# Patient Record
Sex: Male | Born: 1975
Health system: Southern US, Community
[De-identification: ages and names within clinical notes are randomized; demographics above are authoritative.]

## PROBLEM LIST (undated history)

## (undated) DIAGNOSIS — I639 Cerebral infarction, unspecified: Secondary | ICD-10-CM

## (undated) DIAGNOSIS — I10 Essential (primary) hypertension: Secondary | ICD-10-CM

## (undated) DIAGNOSIS — E119 Type 2 diabetes mellitus without complications: Secondary | ICD-10-CM

## (undated) HISTORY — DX: Cerebral infarction, unspecified: I63.9

## (undated) HISTORY — PX: OTHER SURGICAL HISTORY: SHX169

---

## 2016-12-19 DIAGNOSIS — I420 Dilated cardiomyopathy: Secondary | ICD-10-CM

## 2016-12-19 HISTORY — DX: Dilated cardiomyopathy: I42.0

## 2017-01-02 ENCOUNTER — Other Ambulatory Visit: Payer: Self-pay

## 2017-01-02 ENCOUNTER — Inpatient Hospital Stay (HOSPITAL_COMMUNITY)
Admission: EM | Admit: 2017-01-02 | Discharge: 2017-01-06 | DRG: 287 | Disposition: A | Discharge status: Home / Self Care | Payer: Self-pay | Attending: Internal Medicine | Admitting: Internal Medicine

## 2017-01-02 ENCOUNTER — Encounter (HOSPITAL_COMMUNITY): Payer: Self-pay | Admitting: Emergency Medicine

## 2017-01-02 ENCOUNTER — Emergency Department (HOSPITAL_COMMUNITY): Payer: Self-pay

## 2017-01-02 DIAGNOSIS — I509 Heart failure, unspecified: Secondary | ICD-10-CM

## 2017-01-02 DIAGNOSIS — I43 Cardiomyopathy in diseases classified elsewhere: Secondary | ICD-10-CM | POA: Diagnosis present

## 2017-01-02 DIAGNOSIS — I5041 Acute combined systolic (congestive) and diastolic (congestive) heart failure: Secondary | ICD-10-CM

## 2017-01-02 DIAGNOSIS — R778 Other specified abnormalities of plasma proteins: Secondary | ICD-10-CM

## 2017-01-02 DIAGNOSIS — Z8249 Family history of ischemic heart disease and other diseases of the circulatory system: Secondary | ICD-10-CM

## 2017-01-02 DIAGNOSIS — I161 Hypertensive emergency: Secondary | ICD-10-CM | POA: Diagnosis present

## 2017-01-02 DIAGNOSIS — R062 Wheezing: Secondary | ICD-10-CM | POA: Diagnosis present

## 2017-01-02 DIAGNOSIS — E138 Other specified diabetes mellitus with unspecified complications: Secondary | ICD-10-CM

## 2017-01-02 DIAGNOSIS — R51 Headache: Secondary | ICD-10-CM | POA: Diagnosis not present

## 2017-01-02 DIAGNOSIS — I110 Hypertensive heart disease with heart failure: Principal | ICD-10-CM | POA: Diagnosis present

## 2017-01-02 DIAGNOSIS — R7989 Other specified abnormal findings of blood chemistry: Secondary | ICD-10-CM

## 2017-01-02 DIAGNOSIS — Z6841 Body Mass Index (BMI) 40.0 and over, adult: Secondary | ICD-10-CM

## 2017-01-02 DIAGNOSIS — I248 Other forms of acute ischemic heart disease: Secondary | ICD-10-CM | POA: Diagnosis present

## 2017-01-02 DIAGNOSIS — E119 Type 2 diabetes mellitus without complications: Secondary | ICD-10-CM

## 2017-01-02 DIAGNOSIS — I160 Hypertensive urgency: Secondary | ICD-10-CM

## 2017-01-02 HISTORY — DX: Essential (primary) hypertension: I10

## 2017-01-02 HISTORY — DX: Type 2 diabetes mellitus without complications: E11.9

## 2017-01-02 LAB — HEPATIC FUNCTION PANEL
ALBUMIN: 4.1 g/dL (ref 3.5–5.0)
ALT: 33 U/L (ref 17–63)
AST: 26 U/L (ref 15–41)
Alkaline Phosphatase: 49 U/L (ref 38–126)
Bilirubin, Direct: 0.1 mg/dL (ref 0.1–0.5)
Indirect Bilirubin: 0.6 mg/dL (ref 0.3–0.9)
TOTAL PROTEIN: 7.3 g/dL (ref 6.5–8.1)
Total Bilirubin: 0.7 mg/dL (ref 0.3–1.2)

## 2017-01-02 LAB — TROPONIN I
TROPONIN I: 0.13 ng/mL — AB (ref <0.03)
TROPONIN I: 0.14 ng/mL — AB (ref <0.03)

## 2017-01-02 LAB — BASIC METABOLIC PANEL
Anion gap: 10 (ref 5–15)
BUN: 13 mg/dL (ref 6–20)
CHLORIDE: 105 mmol/L (ref 101–111)
CO2: 26 mmol/L (ref 22–32)
CREATININE: 1.25 mg/dL — AB (ref 0.61–1.24)
Calcium: 9 mg/dL (ref 8.9–10.3)
GFR calc Af Amer: >60 mL/min (ref >60)
Glucose, Bld: 129 mg/dL — ABNORMAL HIGH (ref 65–99)
Potassium: 4.2 mmol/L (ref 3.5–5.1)
SODIUM: 141 mmol/L (ref 135–145)

## 2017-01-02 LAB — GLUCOSE, CAPILLARY
GLUCOSE-CAPILLARY: 108 mg/dL — AB (ref 65–99)
Glucose-Capillary: 147 mg/dL — ABNORMAL HIGH (ref 65–99)

## 2017-01-02 LAB — CBC
HCT: 43.5 % (ref 39.0–52.0)
HEMATOCRIT: 40.8 % (ref 39.0–52.0)
HEMOGLOBIN: 13.1 g/dL (ref 13.0–17.0)
Hemoglobin: 14 g/dL (ref 13.0–17.0)
MCH: 26 pg (ref 26.0–34.0)
MCH: 26 pg (ref 26.0–34.0)
MCHC: 32.2 g/dL (ref 30.0–36.0)
MCHC: 32.1 g/dL (ref 30.0–36.0)
MCV: 80.7 fL (ref 78.0–100.0)
MCV: 81.1 fL (ref 78.0–100.0)
PLATELETS: 278 10*3/uL (ref 150–400)
Platelets: 254 10*3/uL (ref 150–400)
RBC: 5.39 MIL/uL (ref 4.22–5.81)
RBC: 5.03 MIL/uL (ref 4.22–5.81)
RDW: 14.5 % (ref 11.5–15.5)
RDW: 14.4 % (ref 11.5–15.5)
WBC: 7.9 10*3/uL (ref 4.0–10.5)
WBC: 8.4 10*3/uL (ref 4.0–10.5)

## 2017-01-02 LAB — TSH: TSH: 0.553 u[IU]/mL (ref 0.350–4.500)

## 2017-01-02 LAB — POCT I-STAT TROPONIN I
Troponin i, poc: 0.29 ng/mL (ref 0.00–0.08)
Troponin i, poc: 0.2 ng/mL (ref 0.00–0.08)

## 2017-01-02 LAB — MRSA PCR SCREENING: MRSA by PCR: NEGATIVE

## 2017-01-02 LAB — CREATININE, SERUM: Creatinine, Ser: 1.26 mg/dL — ABNORMAL HIGH (ref 0.61–1.24)

## 2017-01-02 LAB — BRAIN NATRIURETIC PEPTIDE: B Natriuretic Peptide: 493.4 pg/mL — ABNORMAL HIGH (ref 0.0–100.0)

## 2017-01-02 MED ORDER — ASPIRIN 81 MG PO CHEW
324.0000 mg | CHEWABLE_TABLET | Freq: Once | ORAL | Status: AC
  Administered 2017-01-02: 324 mg via ORAL
  Filled 2017-01-02: qty 4

## 2017-01-02 MED ORDER — SODIUM CHLORIDE 0.9 % IV SOLN: 250.0000 mL | INTRAVENOUS | Status: DC | PRN

## 2017-01-02 MED ORDER — NITROGLYCERIN IN D5W 200-5 MCG/ML-% IV SOLN
0.0000 ug/min | Freq: Once | INTRAVENOUS | Status: AC
  Administered 2017-01-02: 5 ug/min via INTRAVENOUS
  Filled 2017-01-02: qty 250

## 2017-01-02 MED ORDER — ENOXAPARIN SODIUM 40 MG/0.4ML ~~LOC~~ SOLN
40.0000 mg | SUBCUTANEOUS | Status: DC
  Administered 2017-01-02 – 2017-01-05 (×4): 40 mg via SUBCUTANEOUS
  Filled 2017-01-02 (×4): qty 0.4

## 2017-01-02 MED ORDER — INSULIN ASPART 100 UNIT/ML ~~LOC~~ SOLN
0.0000 [IU] | Freq: Three times a day (TID) | SUBCUTANEOUS | Status: DC
  Administered 2017-01-03: 2 [IU] via SUBCUTANEOUS
  Administered 2017-01-03 – 2017-01-06 (×6): 3 [IU] via SUBCUTANEOUS

## 2017-01-02 MED ORDER — HYDRALAZINE HCL 20 MG/ML IJ SOLN
10.0000 mg | Freq: Once | INTRAMUSCULAR | Status: AC
  Administered 2017-01-02: 10 mg via INTRAVENOUS
  Filled 2017-01-02: qty 0.5

## 2017-01-02 MED ORDER — ALBUTEROL SULFATE (2.5 MG/3ML) 0.083% IN NEBU
2.5000 mg | INHALATION_SOLUTION | Freq: Once | RESPIRATORY_TRACT | Status: AC
  Administered 2017-01-02: 2.5 mg via RESPIRATORY_TRACT
  Filled 2017-01-02: qty 3

## 2017-01-02 MED ORDER — SODIUM CHLORIDE 0.9% FLUSH
3.0000 mL | Freq: Two times a day (BID) | INTRAVENOUS | Status: DC
  Administered 2017-01-02 – 2017-01-06 (×8): 3 mL via INTRAVENOUS

## 2017-01-02 MED ORDER — INSULIN ASPART 100 UNIT/ML ~~LOC~~ SOLN
0.0000 [IU] | Freq: Every day | SUBCUTANEOUS | Status: DC
  Administered 2017-01-05: 2 [IU] via SUBCUTANEOUS

## 2017-01-02 MED ORDER — HYDROCHLOROTHIAZIDE 25 MG PO TABS
25.0000 mg | ORAL_TABLET | Freq: Every day | ORAL | Status: DC
  Administered 2017-01-02 – 2017-01-03 (×2): 25 mg via ORAL
  Filled 2017-01-02 (×2): qty 1

## 2017-01-02 MED ORDER — ASPIRIN EC 81 MG PO TBEC
81.0000 mg | DELAYED_RELEASE_TABLET | Freq: Every day | ORAL | Status: DC
  Administered 2017-01-02 – 2017-01-06 (×4): 81 mg via ORAL
  Filled 2017-01-02 (×4): qty 1

## 2017-01-02 MED ORDER — ACETAMINOPHEN 325 MG PO TABS
650.0000 mg | ORAL_TABLET | ORAL | Status: DC | PRN
  Administered 2017-01-02 – 2017-01-06 (×6): 650 mg via ORAL
  Filled 2017-01-02 (×6): qty 2

## 2017-01-02 MED ORDER — ONDANSETRON HCL 4 MG/2ML IJ SOLN
4.0000 mg | Freq: Four times a day (QID) | INTRAMUSCULAR | Status: DC | PRN
  Administered 2017-01-05: 4 mg via INTRAVENOUS
  Filled 2017-01-02: qty 2

## 2017-01-02 MED ORDER — NITROGLYCERIN IN D5W 200-5 MCG/ML-% IV SOLN
0.0000 ug/min | INTRAVENOUS | Status: DC
  Administered 2017-01-02: 75 ug/min via INTRAVENOUS
  Administered 2017-01-03: 30 ug/min via INTRAVENOUS
  Administered 2017-01-03: 0 ug/min via INTRAVENOUS
  Administered 2017-01-03: 20 ug/min via INTRAVENOUS
  Administered 2017-01-03: 45 ug/min via INTRAVENOUS
  Filled 2017-01-02 (×2): qty 250

## 2017-01-02 MED ORDER — FUROSEMIDE 10 MG/ML IJ SOLN
40.0000 mg | Freq: Once | INTRAMUSCULAR | Status: AC
  Administered 2017-01-02: 40 mg via INTRAVENOUS
  Filled 2017-01-02: qty 4

## 2017-01-02 MED ORDER — SODIUM CHLORIDE 0.9% FLUSH: 3.0000 mL | INTRAVENOUS | Status: DC | PRN

## 2017-01-02 NOTE — ED Notes (Signed)
Pt left with [facility] 

## 2017-01-02 NOTE — ED Notes (Addendum)
[facility] called to transfer pt to 4N at [facility]

## 2017-01-02 NOTE — Care Management Note (Addendum)
Case Management Note  Patient Details  Name: Rodney Kane MRN: [id number] Date of Birth: 1976/06/09  Subjective/Objective:          Patient presented to [facility] ED with c/o CP and SOB. Past Medical Hx of HTN and DM patient reports being off his meds for sometime. ED work up revealed  Rule out pneumonia. Rule out CHF  Patient recently relocated to the area from [place]. Patient currently is uninsured without any follow up. CM discussed possible care transition plan for follow at the [facility] patient is agreeable with plan to establish care at the clinic. CM scheduled appointment next Wednesday 6/28 at 9:45am Also discussed the [facility] program and its guidelines including the $3 co-pay per prescription, patient verbalized understanding and teach back done he is agreeable. Letter will be printed and placed in patient's shadow chart. Patient is being transferred to [facility] for admission. CM explained that the Unit CM will continue to follow for any additional CM needs.    Action/Plan:   Expected Discharge Date:   (unknown)               Expected Discharge Plan:  Home/Self Care  In-House Referral:   Contacted Daphne RN navigator concerning possible follow up at the [facility]   Discharge planning Services  CM Consult, [facility]  Post Acute Care Choice:    Choice offered to:  Patient  DME Arranged:  N/A DME Agency:  NA  HH Arranged:  NA HH Agency:  NA  Status of Service:  In process, will continue to follow  If discussed at Long Length of Stay Meetings, dates discussed:    Additional CommentsMichel Bickers, RN 01/02/2017, 12:34 PM

## 2017-01-02 NOTE — ED Triage Notes (Addendum)
Pt c/o SOB onset 2 weeks ago after mild illness with cough. Chest pressure x 1 week. SOB and chest pressure worse with activity and laying supine, relieved with rest and sitting upright.

## 2017-01-02 NOTE — Progress Notes (Signed)
Pt arrived to floor, text page to cards per order to let them know of pts arrival.

## 2017-01-02 NOTE — ED Notes (Signed)
Patient transported to X-ray 

## 2017-01-02 NOTE — ED Notes (Signed)
Pt updated on plan of care and notified he will soon be transferred to [facility]

## 2017-01-02 NOTE — ED Notes (Signed)
Pt is currently at 29mcg/min of nitroglycerin. Pt's BP is 165/100

## 2017-01-02 NOTE — Progress Notes (Signed)
Pt. admitted to 4NP01 from [facility] ED via [facility]. Oriented to room, call bell, Ascom phones and staff. Bed in low position, fall safety plan reviewed.  Full assessment to Epic; skin assessed with Alinda Money, RN.   Nitro gtt infusing w/o difficulty, pt denies any chest pain/pressure/discomfort at this time. BP remains elevated, 160s/100s, nitro increased to per order. Pt does endorse a headache. Cardiology aware of pt arrival to floor and need for orders. Will continue to monitor.

## 2017-01-02 NOTE — ED Notes (Signed)
Upon assessment of pt BP, Nitro drip is being titrated.

## 2017-01-02 NOTE — ED Notes (Signed)
Pt requesting food and po fluid. MD made aware. MD informed nurse that pt may eat. Pt given food and PO fluid

## 2017-01-02 NOTE — H&P (Addendum)
Patient ID: Rodney Kane MRN: [id number], DOB/AGE: 26-Aug-1975  Admit date: 01/02/2017 Primary Physician: Patient, No Pcp Per Primary Cardiologist: None  CARDIOLOGY ADMISSION History & Physical   HISTORY OF PRESENT ILLNESS   [name] is a 41 year old male with history of diet controlled diabetes, hypertension (was taking HCTZ, stopped 1 year ago) who is admitted with elevated troponin.  The patient tells me that he was in his normal state of health until two weeks ago. At baseline, he is active. 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 He developed cold-like symptoms that were accompanied by shortness of breath, productive cough (mucus), central chest pressure, and left sided pleuritic chest pain. After a few days the cold-like sympoms improved (sore throat, fatigue) but the other symptoms have progressed over about two weeks (they improved mid-week only to worsen the past couple days). He tells me that he has had no significant edema, but has noticed ~20 lb weight increase over about one week. No orthopnea or PND. He had one episode of presyncope about one month ago, but cannot provide any details. He denies fevers, chills, travel. One episode of diarrhea, otherwise no n/v/d. No abd pain. No rashes or swollen joints.  At work today he was struggling to breath due to wheezing and was told to visit the ED at [facility]. In the ED his BP was elevated (SBP > 200 mmHg) and he was tachycardic (110-120s). He was not hypoxic. He received quite a bit of relief with nebulizers, and was placed on nitro drip. Dr. Debara Pickett was contacted for transfer. Here his BP is better controlled and he is asymptomatic.  Review of Systems General:  No chills, fever, night sweats or weight changes.  Cardiovascular:  See HPI Dermatological: No rash, lesions/masses Respiratory: See HPI Urologic: No hematuria, dysuria Abdominal:   No nausea, vomiting, diarrhea, bright red blood per rectum,  melena, or hematemesis Neurologic:  No visual changes, wkns, changes in mental status. All other systems reviewed and are otherwise negative except as noted above.   MEDICAL, FAMILY, AND SOCIAL HISTORY   Past Medical History:  Diagnosis Date  . Diabetes mellitus without complication ([place])   . Hypertension     Past Surgical History:  Procedure Laterality Date  . left knee surgery      No Known Allergies Prior to Admission medications   Not on File   Family History  Problem Relation Age of Onset  . Multiple sclerosis Mother   . Diabetic kidney disease Mother   . Hypertension Mother    Social History   Social History  . Marital status: Single    Spouse name: N/A  . Number of children: N/A  . Years of education: N/A   Occupational History  . Not on file.   Social History Main Topics  . Smoking status: Never Smoker  . Smokeless tobacco: Never Used  . Alcohol use No  . Drug use: No  . Sexual activity: Not on file   Other Topics Concern  . Not on file   Social History Narrative  . No narrative on file      PHYSICAL EXAM  Blood pressure (!) 163/106, pulse 98, temperature 98.1 F (36.7 C), resp. rate 19, height 6' 1" (1.854 m), weight (!) 162 kg (357 lb 1.6 oz), SpO2 97 %.  General: Pleasant, NAD Psych: Normal affect. Neuro: Alert and oriented X 3. Moves all extremities spontaneously. HEENT: Sclera anicteric, noninjected. MMM.  Neck: No bruits. JVP is ~15 cm. Lungs:  Clear with no wheezes or crackles Heart: regular rate and rhythm, no m/r/g Abdomen: Soft, non-tender, non-distended, BS + x 4.  Extremities: No clubbing or cyanosis. Edema: 1+ BL LE edema. DP/PT/Radials 2+ and equal bilaterally.   LABS and STUDIES  Troponin (Point of Care Test)  Recent Labs  01/02/17 1256  TROPIPOC 0.20*    Recent Labs  01/02/17 1651  TROPONINI 0.13*   Lab Results  Component Value Date   WBC 7.9 01/02/2017   HGB 14.0 01/02/2017   HCT 43.5 01/02/2017   MCV 80.7  01/02/2017   PLT 278 01/02/2017    Recent Labs Lab 01/02/17 0800 01/02/17 1651  NA 141  --   K 4.2  --   CL 105  --   CO2 26  --   BUN 13  --   CREATININE 1.25*  --   CALCIUM 9.0  --   PROT  --  7.3  BILITOT  --  0.7  ALKPHOS  --  49  ALT  --  33  AST  --  26  GLUCOSE 129*  --    No results found for: CHOL, HDL, LDLCALC, TRIG No results found for: DDIMER   Radiology/Studies. Independently Reviewed CXR showed pulmonary parenchymal appearance suggestive of interstitial edema.   ECG was independently reviewed. Sinus tach, HR 113 bpm, inverted T-waves laterally   ASSESSMENT and PLAN   41 year old man with cold-like symptoms for two weeks accompanied by chest pressure, weight gain, mild LE edema. Given CXR findings, there is concern for new diagnosis CHF. Pro-BNP is mildly elevated. He already received furosemide, but he only appears mildly fluid overloaded.  Hypertensive Urgency - Nitroglycerine has controlled his blood pressure. Will titrate down with goal SBP ~150 mmHg - Starting HCTZ 25 mg daily  Concern for new diagnosis CHF. Etiology unclear - ddx includes viral myocarditis (and other forms of NICM) and subacute presentation of ICM. - The patient is mildly volume overloaded, but he is not hypoxic. He is warm and well perfused. - Echocardiogram in the morning - S/P Furosemide 40 mg IV - Watch I/Os, daily weights - Holding on BB (volume overloaded) and ACE-I (mildly elevated Cr, unclear of baseline) - ESR, CRP, HIV pending  Elevated troponin. Chest discomfort has been present for two weeks and therefore is unlikely ACS. Likely due to demand ischemia. - No anticoagulation at this time - Continue to trend troponin - Aspirin 81 mg daily - Lipids pending  Recent Viral URI. Initial presentation the patient had wheezes on exam, although none once he reached [facility].  - Continue Albuterol prn for wheezing  History of Diabetes. Diet-controlled. - Hgb A1C Pending - Sliding  Scale Insulin  IVF: None Diet: Heart Healthy, NPO at midnight DVT PPX: Lovenox Dispo: Admit to [facility], Cardiology service Code Status: Full Code  PAULEY, ERIC D, MD 01/02/2017, 9:45 PM

## 2017-01-02 NOTE — ED Provider Notes (Addendum)
[facility]-EMERGENCY DEPT Provider Note   CSN: [id number] Arrival date & time: 01/02/17  5974     History   Chief Complaint Chief Complaint  Patient presents with  . Shortness of Breath  . Chest Pain    HPI Daemeon Bridwell is a 41 y.o. male. CC: SOB  HPI:  This is a 41 year old male with a past medical history of hypertension. He has been off his medications for several months. Used to take hydrochlorothiazide and something else". States he had a "cold" with runny nose and cough and no fever that 2 weeks ago. Since that time he's had shortness of breath. At times feels a weight across his chest like "I can't get my lungs full". This feeling his right greater than left. No hemoptysis. Frequent cough and wheezing. No additional fever. No leg swelling.  Past Medical History:  Diagnosis Date  . Diabetes mellitus without complication (HCC)   . Hypertension     There are no active problems to display for this patient.   No past surgical history on file.     Home Medications    Prior to Admission medications   Not on File    Family History No family history on file.  Social History Social History  Substance Use Topics  . Smoking status: Not on file  . Smokeless tobacco: Not on file  . Alcohol use Not on file     Allergies   Patient has no known allergies.   Review of Systems Review of Systems  Constitutional: Negative for appetite change, chills, diaphoresis, fatigue and fever.  HENT: Positive for congestion and rhinorrhea. Negative for mouth sores, sore throat and trouble swallowing.   Eyes: Negative for visual disturbance.  Respiratory: Positive for cough and shortness of breath. Negative for chest tightness and wheezing.   Cardiovascular: Negative for chest pain.  Gastrointestinal: Negative for abdominal distention, abdominal pain, diarrhea, nausea and vomiting.  Endocrine: Negative for polydipsia, polyphagia and polyuria.  Genitourinary: Negative for dysuria,  frequency and hematuria.  Musculoskeletal: Negative for gait problem.  Skin: Negative for color change, pallor and rash.  Neurological: Negative for dizziness, syncope, light-headedness and headaches.  Hematological: Does not bruise/bleed easily.  Psychiatric/Behavioral: Negative for behavioral problems and confusion.     Physical Exam Updated Vital Signs BP (!) 194/115   Pulse (!) 114   Temp 97.7 F (36.5 C) (Oral)   Resp 20   SpO2 97%   Physical Exam  Constitutional: He is oriented to person, place, and time. He appears well-developed and well-nourished. No distress.  HENT:  Head: Normocephalic.  Eyes: Conjunctivae are normal. Pupils are equal, round, and reactive to light. No scleral icterus.  Neck: Normal range of motion. Neck supple. No thyromegaly present.  Cardiovascular: Regular rhythm.  Exam reveals no gallop and no friction rub.   No murmur heard. Sinus tachycardia 109. No gallop. No JVD. No dependent edema.  Pulmonary/Chest: Effort normal and breath sounds normal. No respiratory distress. He has no wheezes. He has no rales.  Wheezing with cough. No prolongation at rest.  Abdominal: Soft. Bowel sounds are normal. He exhibits no distension. There is no tenderness. There is no rebound.  Musculoskeletal: Normal range of motion.  Neurological: He is alert and oriented to person, place, and time.  Skin: Skin is warm and dry. No rash noted.  Psychiatric: He has a normal mood and affect. His behavior is normal.     ED Treatments / Results  Labs (all labs ordered are listed, but  only abnormal results are displayed) Labs Reviewed  BASIC METABOLIC PANEL - Abnormal; Notable for the following:       Result Value   Glucose, Bld 129 (*)    Creatinine, Ser 1.25 (*)    All other components within normal limits  POCT I-STAT TROPONIN I - Abnormal; Notable for the following:    Troponin i, poc 0.29 (*)    All other components within normal limits  CBC  BRAIN NATRIURETIC  PEPTIDE  I-STAT TROPOININ, ED    EKG  EKG Interpretation None       Radiology Dg Chest 2 View  Result Date: 01/02/2017 CLINICAL DATA:  41 year old male with shortness of breath for 2 weeks. Illness with cough. Chest pressure, pain for 1 week. EXAM: CHEST  2 VIEW COMPARISON:  None. FINDINGS: Cardiomegaly. Other mediastinal contours are within normal limits. Visualized tracheal air column is within normal limits. Increased pulmonary interstitial opacity. Possible trace pleural fluid in the fissures. No pneumothorax or layering effusion. No confluent pulmonary opacity. No acute osseous abnormality identified. Negative visible bowel gas pattern. IMPRESSION: Cardiomegaly, with pulmonary parenchymal appearance suggestive of interstitial edema. Consider congestive heart failure. Electronically Signed   By: Odessa Fleming M.D.   On: 01/02/2017 08:22    Procedures Procedures (including critical care time)  Medications Ordered in ED Medications  hydrALAZINE (APRESOLINE) injection 10 mg (not administered)  furosemide (LASIX) injection 40 mg (not administered)  nitroGLYCERIN 50 mg in dextrose 5 % 250 mL (0.2 mg/mL) infusion (not administered)  aspirin chewable tablet 324 mg (not administered)  albuterol (PROVENTIL) (2.5 MG/3ML) 0.083% nebulizer solution 2.5 mg (2.5 mg Nebulization Given 01/02/17 0817)     Initial Impression / Assessment and Plan / ED Course  I have reviewed the triage vital signs and the nursing notes.  Pertinent labs & imaging results that were available during my care of the patient were reviewed by me and considered in my medical decision making (see chart for details).   Pt with recent URI with bronchospasm. Rule out pneumonia. Rule out CHF. Patient had anterior chest pressure although is pain free now. Consider myocarditis, versus subacute MI. He is hypertensive. We'll trend his blood pressures here. Treat as needed based on additional studies. Does have resting tachycardia.  Would have to consider pulmonary embolus is no other etiology found.  08:39:  Chest x-ray shows scarring megaly and CHF with interstitial edema. Troponin elevated at 0.29. Creatinine 1.25. Differential diagnosis includes and stenting, follow myocarditis. Given hydralazine 10 mg IV. Given Lasix 40 mg IV. We'll start nitroglycerin drip for blood pressure. Given aspirin. Remains pain-free. Still dyspneic, but not hypoxemic. Will discuss with cardiology for admission plans  Final Clinical Impressions(s) / ED Diagnoses   Final diagnoses:  Acute congestive heart failure, unspecified heart failure type (HCC)  Malignant hypertensive urgency    CRITICAL CARE Performed by: Rolland Porter JOSEPH   Total critical care time: 30 minutes  Critical care time was exclusive of separately billable procedures and treating other patients.  Critical care was necessary to treat or prevent imminent or life-threatening deterioration.  Critical care was time spent personally by me on the following activities: development of treatment plan with patient and/or surrogate as well as nursing, discussions with consultants, evaluation of patient's response to treatment, examination of patient, obtaining history from patient or surrogate, ordering and performing treatments and interventions, ordering and review of laboratory studies, ordering and review of radiographic studies, pulse oximetry and re-evaluation of patient's condition.   New Prescriptions  New Prescriptions   No medications on file     08:59: Discussed with Dr. Rennis Golden of CHF cardiology cardiology. He would prefer patient be transferred to [facility] for further evaluation and treatment.   Rolland Porter, MD 01/02/17 1610    Rolland Porter, MD 01/02/17 [phone]

## 2017-01-02 NOTE — ED Notes (Signed)
Report given to Emer, Charity fundraiser at [facility]

## 2017-01-02 NOTE — ED Notes (Signed)
Pt is at 37mcg/min of nitroglycerin. Pt is holding steady between 120-135 systolic. Pt is Alert and oriented x4 and states he is not having any pain.

## 2017-01-03 ENCOUNTER — Other Ambulatory Visit (HOSPITAL_COMMUNITY): Payer: Self-pay

## 2017-01-03 LAB — URINALYSIS, ROUTINE W REFLEX MICROSCOPIC
BACTERIA UA: NONE SEEN
Bilirubin Urine: NEGATIVE
Glucose, UA: NEGATIVE mg/dL
KETONES UR: NEGATIVE mg/dL
LEUKOCYTES UA: NEGATIVE
Nitrite: NEGATIVE
PROTEIN: NEGATIVE mg/dL
SQUAMOUS EPITHELIAL / LPF: NONE SEEN
Specific Gravity, Urine: 1.013 (ref 1.005–1.030)
pH: 5 (ref 5.0–8.0)

## 2017-01-03 LAB — SEDIMENTATION RATE: SED RATE: 5 mm/h (ref 0–16)

## 2017-01-03 LAB — RAPID URINE DRUG SCREEN, HOSP PERFORMED
Amphetamines: NOT DETECTED
BENZODIAZEPINES: NOT DETECTED
Barbiturates: NOT DETECTED
COCAINE: NOT DETECTED
OPIATES: NOT DETECTED
Tetrahydrocannabinol: NOT DETECTED

## 2017-01-03 LAB — C-REACTIVE PROTEIN: CRP: 1.1 mg/dL — ABNORMAL HIGH (ref <1.0)

## 2017-01-03 LAB — GLUCOSE, CAPILLARY
GLUCOSE-CAPILLARY: 114 mg/dL — AB (ref 65–99)
Glucose-Capillary: 137 mg/dL — ABNORMAL HIGH (ref 65–99)
Glucose-Capillary: 124 mg/dL — ABNORMAL HIGH (ref 65–99)
Glucose-Capillary: 153 mg/dL — ABNORMAL HIGH (ref 65–99)

## 2017-01-03 LAB — HEMOGLOBIN A1C
Hgb A1c MFr Bld: 7 % — ABNORMAL HIGH (ref 4.8–5.6)
Mean Plasma Glucose: 154 mg/dL

## 2017-01-03 LAB — BASIC METABOLIC PANEL
Anion gap: 10 (ref 5–15)
BUN: 12 mg/dL (ref 6–20)
CALCIUM: 9.3 mg/dL (ref 8.9–10.3)
CHLORIDE: 99 mmol/L — AB (ref 101–111)
CO2: 28 mmol/L (ref 22–32)
CREATININE: 1.25 mg/dL — AB (ref 0.61–1.24)
Glucose, Bld: 109 mg/dL — ABNORMAL HIGH (ref 65–99)
Potassium: 4.4 mmol/L (ref 3.5–5.1)
SODIUM: 137 mmol/L (ref 135–145)

## 2017-01-03 LAB — LIPID PANEL
Cholesterol: 171 mg/dL (ref 0–200)
HDL: 42 mg/dL (ref >40)
LDL Cholesterol: 115 mg/dL — ABNORMAL HIGH (ref 0–99)
TRIGLYCERIDES: 70 mg/dL (ref <150)
Total CHOL/HDL Ratio: 4.1 RATIO
VLDL: 14 mg/dL (ref 0–40)

## 2017-01-03 LAB — TROPONIN I
TROPONIN I: 0.18 ng/mL — AB (ref <0.03)
TROPONIN I: 0.1 ng/mL — AB (ref <0.03)

## 2017-01-03 MED ORDER — FUROSEMIDE 10 MG/ML IJ SOLN
40.0000 mg | Freq: Two times a day (BID) | INTRAMUSCULAR | Status: DC
  Administered 2017-01-03 – 2017-01-04 (×3): 40 mg via INTRAVENOUS
  Filled 2017-01-03 (×3): qty 4

## 2017-01-03 MED ORDER — AMLODIPINE BESYLATE 10 MG PO TABS
10.0000 mg | ORAL_TABLET | Freq: Every day | ORAL | Status: DC
  Administered 2017-01-03 – 2017-01-04 (×2): 10 mg via ORAL
  Filled 2017-01-03 (×2): qty 1

## 2017-01-03 MED ORDER — HYDRALAZINE HCL 25 MG PO TABS
37.5000 mg | ORAL_TABLET | Freq: Three times a day (TID) | ORAL | Status: DC
Start: 2017-01-03 — End: 2017-01-06
  Administered 2017-01-03 – 2017-01-06 (×10): 37.5 mg via ORAL
  Filled 2017-01-03: qty 2
  Filled 2017-01-03 (×3): qty 1.5
  Filled 2017-01-03: qty 2
  Filled 2017-01-03 (×2): qty 1.5
  Filled 2017-01-03: qty 2
  Filled 2017-01-03 (×4): qty 1.5
  Filled 2017-01-03: qty 2

## 2017-01-03 MED ORDER — FUROSEMIDE 10 MG/ML IJ SOLN: 40.0000 mg | Freq: Two times a day (BID) | INTRAMUSCULAR | Status: DC

## 2017-01-03 NOTE — Progress Notes (Addendum)
Progress Note  Patient Name: Rodney Kane Date of Encounter: 01/03/2017    Subjective   SOB improved. No chest pain. Headache from NG drip  Inpatient Medications    Scheduled Meds: . aspirin EC  81 mg Oral Daily  . enoxaparin (LOVENOX) injection  40 mg Subcutaneous Q24H  . hydrochlorothiazide  25 mg Oral Daily  . insulin aspart  0-15 Units Subcutaneous TID WC  . insulin aspart  0-5 Units Subcutaneous QHS  . sodium chloride flush  3 mL Intravenous Q12H   Continuous Infusions: . sodium chloride    . nitroGLYCERIN 45 mcg/min (01/03/17 1010)   PRN Meds: sodium chloride, acetaminophen, ondansetron (ZOFRAN) IV, sodium chloride flush   Vital Signs    Vitals:   01/03/17 0430 01/03/17 0500 01/03/17 0635 01/03/17 0730  BP: (!) 138/91 (!) 146/104 (!) 156/109 (!) 148/89  Pulse: (!) 107 (!) 102 (!) 104 (!) 109  Resp: (!) 23 (!) 29 (!) 24 (!) 22  Temp:    99.8 F (37.7 C)  TempSrc:    Oral  SpO2: 92% 96% 94% 98%  Weight:  (!) 357 lb (161.9 kg)    Height:        Intake/Output Summary (Last 24 hours) at 01/03/17 1113 Last data filed at 01/03/17 0800  Gross per 24 hour  Intake           214.38 ml  Output              650 ml  Net          -435.62 ml   Filed Weights   01/02/17 0935 01/02/17 2033 01/03/17 0500  Weight: (!) 360 lb (163.3 kg) (!) 357 lb 1.6 oz (162 kg) (!) 357 lb (161.9 kg)    Telemetry    SR and sinus tach - Personally Reviewed  ECG      Physical Exam   GEN: No acute distress.   Neck: No JVD Cardiac: RRR, no murmurs, rubs, or gallops.  Respiratory: Clear to auscultation bilaterally. GI: Soft, nontender, non-distended  MS: No edema; No deformity. Neuro:  Nonfocal  Psych: Normal affect   Labs    Chemistry Recent Labs Lab 01/02/17 0800 01/02/17 1651 01/02/17 2230 01/03/17 0417  NA 141  --   --  137  K 4.2  --   --  4.4  CL 105  --   --  99*  CO2 26  --   --  28  GLUCOSE 129*  --   --  109*  BUN 13  --   --  12  CREATININE 1.25*  --   1.26* 1.25*  CALCIUM 9.0  --   --  9.3  PROT  --  7.3  --   --   ALBUMIN  --  4.1  --   --   AST  --  26  --   --   ALT  --  33  --   --   ALKPHOS  --  49  --   --   BILITOT  --  0.7  --   --   GFRNONAA >60  --  >60 >60  GFRAA >60  --  >60 >60  ANIONGAP 10  --   --  10     Hematology Recent Labs Lab 01/02/17 0800 01/02/17 2230  WBC 7.9 8.4  RBC 5.39 5.03  HGB 14.0 13.1  HCT 43.5 40.8  MCV 80.7 81.1  MCH 26.0 26.0  MCHC 32.2 32.1  RDW 14.5 14.4  PLT 278 254    Cardiac Enzymes Recent Labs Lab 01/02/17 1651 01/02/17 2230 01/03/17 0417  TROPONINI 0.13* 0.14* 0.18*    Recent Labs Lab 01/02/17 0814 01/02/17 1256  TROPIPOC 0.29* 0.20*     BNP Recent Labs Lab 01/02/17 0800  BNP 493.4*     DDimer No results for input(s): DDIMER in the last 168 hours.   Radiology    Dg Chest 2 View  Result Date: 01/02/2017 CLINICAL DATA:  41 year old male with shortness of breath for 2 weeks. Illness with cough. Chest pressure, pain for 1 week. EXAM: CHEST  2 VIEW COMPARISON:  None. FINDINGS: Cardiomegaly. Other mediastinal contours are within normal limits. Visualized tracheal air column is within normal limits. Increased pulmonary interstitial opacity. Possible trace pleural fluid in the fissures. No pneumothorax or layering effusion. No confluent pulmonary opacity. No acute osseous abnormality identified. Negative visible bowel gas pattern. IMPRESSION: Cardiomegaly, with pulmonary parenchymal appearance suggestive of interstitial edema. Consider congestive heart failure. Electronically Signed   By: Odessa Fleming M.D.   On: 01/02/2017 08:22    Cardiac Studies    Patient Profile      41 year old man with cold-like symptoms for two weeks accompanied by chest pressure, weight gain, mild LE edema. Given CXR findings, there is concern for new diagnosis CHF  Assessment & Plan    1. Acute CHF - mechanism unclear at this time, echo is pending - severe HTN on admission, possible HTN  CM. Also recent viral syndrome. Always consider ICM - he is negative 1.3 liters since admission. REnal function is stable, he received lasix 40mg  IV x 2 yesterday  - continue IV lasix today, 40mg  bid.    2. HTN emergency - initial bp 194/115. He reports he stopped taking his home bp's. - started on NG drip on admission in setting of pulmonary edema. - start norvasc 10mg , hydralazine 37.5mg  tid at this time. If systolic dysfunction will need to change regiment. Once diuresed and stable Cr can consider changing hydral to ACE-I.    3. Elevated troponin - peak 0.29, trending down by istat troponins, lab trops actually 0.14 with mild uptrend to 0.18. Marland Kitchen Occurred in setting of HTN emergency and CHF. EKG diffuse T-wave inversions, no old EKG to compare.  - f/u echo, will need either invasive vs noninvasive ischemic testing pending echo results and clinical course   4. DM2 - Hgb A1c is 7, he has history of diet controlled diabetes       Signed, Dina Rich, MD  01/03/2017, 11:13 AM

## 2017-01-04 ENCOUNTER — Inpatient Hospital Stay (HOSPITAL_COMMUNITY): Payer: Self-pay

## 2017-01-04 DIAGNOSIS — I5030 Unspecified diastolic (congestive) heart failure: Secondary | ICD-10-CM

## 2017-01-04 HISTORY — PX: TRANSTHORACIC ECHOCARDIOGRAM: SHX275

## 2017-01-04 LAB — ECHOCARDIOGRAM COMPLETE
Ao-asc: 29 cm
CHL CUP MV DEC (S): 173
EERAT: 13.09
EWDT: 173 ms
FS: 18 % — AB (ref 28–44)
Height: 73 in
IV/PV OW: 1.22
LA vol A4C: 33.1 ml
LA vol index: 15.1 mL/m2
LA vol: 41.2 mL
LADIAMINDEX: 1.28 cm/m2
LASIZE: 35 mm
LEFT ATRIUM END SYS DIAM: 35 mm
LV E/e' medial: 13.09
LV SIMPSON'S DISK: 38
LV TDI E'LATERAL: 4.47
LV TDI E'MEDIAL: 3.92
LV dias vol index: 50 mL/m2
LV dias vol: 137 mL (ref 62–150)
LV e' LATERAL: 4.47 cm/s
LV sys vol index: 31 mL/m2
LV sys vol: 85 mL — AB (ref 21–61)
LVEEAVG: 13.09
LVOT SV: 38 mL
LVOT VTI: 10.1 cm
LVOT area: 3.8 cm2
LVOT diameter: 22 mm
LVOTPV: 67.8 cm/s
Lateral S' vel: 13.4 cm/s
MVPKAVEL: 61.7 m/s
MVPKEVEL: 58.5 m/s
PW: 12.5 mm — AB (ref 0.6–1.1)
S' Lateral: 5.36 cm/s
Stroke v: 52 ml
TAPSE: 23.9 mm
Weight: 5600 oz

## 2017-01-04 LAB — GLUCOSE, CAPILLARY
GLUCOSE-CAPILLARY: 136 mg/dL — AB (ref 65–99)
GLUCOSE-CAPILLARY: 166 mg/dL — AB (ref 65–99)
Glucose-Capillary: 184 mg/dL — ABNORMAL HIGH (ref 65–99)
Glucose-Capillary: 146 mg/dL — ABNORMAL HIGH (ref 65–99)

## 2017-01-04 LAB — BASIC METABOLIC PANEL
ANION GAP: 11 (ref 5–15)
BUN: 13 mg/dL (ref 6–20)
CALCIUM: 9.7 mg/dL (ref 8.9–10.3)
CO2: 31 mmol/L (ref 22–32)
Chloride: 94 mmol/L — ABNORMAL LOW (ref 101–111)
Creatinine, Ser: 1.44 mg/dL — ABNORMAL HIGH (ref 0.61–1.24)
GFR calc Af Amer: >60 mL/min (ref >60)
GFR calc non Af Amer: 59 mL/min — ABNORMAL LOW (ref >60)
GLUCOSE: 118 mg/dL — AB (ref 65–99)
Potassium: 4.3 mmol/L (ref 3.5–5.1)
Sodium: 136 mmol/L (ref 135–145)

## 2017-01-04 LAB — TROPONIN I: Troponin I: 0.07 ng/mL (ref <0.03)

## 2017-01-04 MED ORDER — FUROSEMIDE 10 MG/ML IJ SOLN
40.0000 mg | Freq: Every day | INTRAMUSCULAR | Status: DC
  Administered 2017-01-05 – 2017-01-06 (×2): 40 mg via INTRAVENOUS
  Filled 2017-01-04 (×2): qty 4

## 2017-01-04 MED ORDER — SODIUM CHLORIDE 0.9 % IV SOLN: 250.0000 mL | INTRAVENOUS | Status: DC | PRN

## 2017-01-04 MED ORDER — CARVEDILOL 6.25 MG PO TABS
6.2500 mg | ORAL_TABLET | Freq: Two times a day (BID) | ORAL | Status: DC
  Administered 2017-01-05: 6.25 mg via ORAL
  Filled 2017-01-04: qty 1

## 2017-01-04 MED ORDER — SODIUM CHLORIDE 0.9% FLUSH
3.0000 mL | Freq: Two times a day (BID) | INTRAVENOUS | Status: DC
  Administered 2017-01-04: 3 mL via INTRAVENOUS

## 2017-01-04 MED ORDER — SODIUM CHLORIDE 0.9 % IV SOLN
INTRAVENOUS | Status: DC
  Administered 2017-01-05: 06:00:00 via INTRAVENOUS

## 2017-01-04 MED ORDER — SODIUM CHLORIDE 0.9% FLUSH: 3.0000 mL | INTRAVENOUS | Status: DC | PRN

## 2017-01-04 MED ORDER — ASPIRIN 81 MG PO CHEW
81.0000 mg | CHEWABLE_TABLET | ORAL | Status: AC
  Administered 2017-01-05: 81 mg via ORAL
  Filled 2017-01-04: qty 1

## 2017-01-04 NOTE — Progress Notes (Signed)
Progress Note  Patient Name: Rodney Kane Date of Encounter: 01/04/2017   Subjective   SOB is much improved.   Inpatient Medications    Scheduled Meds: . amLODipine  10 mg Oral Daily  . aspirin EC  81 mg Oral Daily  . enoxaparin (LOVENOX) injection  40 mg Subcutaneous Q24H  . furosemide  40 mg Intravenous BID  . hydrALAZINE  37.5 mg Oral TID  . insulin aspart  0-15 Units Subcutaneous TID WC  . insulin aspart  0-5 Units Subcutaneous QHS  . sodium chloride flush  3 mL Intravenous Q12H   Continuous Infusions: . sodium chloride    . nitroGLYCERIN 0 mcg/min (01/03/17 1600)   PRN Meds: sodium chloride, acetaminophen, ondansetron (ZOFRAN) IV, sodium chloride flush   Vital Signs    Vitals:   01/04/17 0600 01/04/17 0604 01/04/17 0700 01/04/17 0730  BP: 135/88  124/76 115/64  Pulse: 97  99 80  Resp: (!) 21  (!) 23 20  Temp:    99 F (37.2 C)  TempSrc:    Oral  SpO2: 96%  94% 96%  Weight:  (!) 350 lb (158.8 kg)    Height:        Intake/Output Summary (Last 24 hours) at 01/04/17 0943 Last data filed at 01/04/17 0600  Gross per 24 hour  Intake           786.43 ml  Output             5075 ml  Net         -4288.57 ml   Filed Weights   01/02/17 2033 01/03/17 0500 01/04/17 0604  Weight: (!) 357 lb 1.6 oz (162 kg) (!) 357 lb (161.9 kg) (!) 350 lb (158.8 kg)    Telemetry    SR and sinus tach - Personally Reviewed  ECG     Physical Exam   GEN: No acute distress.   Neck: No JVD Cardiac: RRR, no murmurs, rubs, or gallops.  Respiratory: Clear to auscultation bilaterally. GI: Soft, nontender, non-distended  MS: No edema; No deformity. Neuro:  Nonfocal  Psych: Normal affect   Labs    Chemistry Recent Labs Lab 01/02/17 0800 01/02/17 1651 01/02/17 2230 01/03/17 0417 01/04/17 0212  NA 141  --   --  137 136  K 4.2  --   --  4.4 4.3  CL 105  --   --  99* 94*  CO2 26  --   --  28 31  GLUCOSE 129*  --   --  109* 118*  BUN 13  --   --  12 13  CREATININE  1.25*  --  1.26* 1.25* 1.44*  CALCIUM 9.0  --   --  9.3 9.7  PROT  --  7.3  --   --   --   ALBUMIN  --  4.1  --   --   --   AST  --  26  --   --   --   ALT  --  33  --   --   --   ALKPHOS  --  49  --   --   --   BILITOT  --  0.7  --   --   --   GFRNONAA >60  --  >60 >60 59*  GFRAA >60  --  >60 >60 >60  ANIONGAP 10  --   --  10 11     Hematology Recent Labs Lab 01/02/17 0800 01/02/17  2230  WBC 7.9 8.4  RBC 5.39 5.03  HGB 14.0 13.1  HCT 43.5 40.8  MCV 80.7 81.1  MCH 26.0 26.0  MCHC 32.2 32.1  RDW 14.5 14.4  PLT 278 254    Cardiac Enzymes Recent Labs Lab 01/02/17 2230 01/03/17 0417 01/03/17 1128 01/04/17 0212  TROPONINI 0.14* 0.18* 0.10* 0.07*    Recent Labs Lab 01/02/17 0814 01/02/17 1256  TROPIPOC 0.29* 0.20*     BNP Recent Labs Lab 01/02/17 0800  BNP 493.4*     DDimer No results for input(s): DDIMER in the last 168 hours.   Radiology    No results found.  Cardiac Studies     Patient Profile     41 year old man with cold-like symptoms for two weeks accompanied by chest pressure, weight gain, mild LE edema. Given CXR findings, there is concern for new diagnosis CHF  Assessment & Plan    1. Acute CHF - mechanism unclear at this time, echo is pending - severe HTN on admission, possible HTN CM. Also recent viral syndrome. Must also consider ICM - he is negative 2.2  liters yesterday, negative 5.5 liters since admission. He is on lasix 40mg  IV bid, uptrend in Cr. Will dose IV lasix just once today.     2. HTN emergency - initial bp 194/115. He reports he stopped taking his home bp's. - started on NG drip on admission in setting of pulmonary edema. - start norvasc 10mg , hydralazine 37.5mg  tid yesterday. If systolic dysfunction will need to change regimen. Once diuresed and stable Cr can consider changing hydral to ACE-I.  - off NG drip, good bp control with orals alone.    3. Elevated troponin - peak 0.29, trending down by istat  troponins. Occurred in setting of HTN emergency and CHF. EKG diffuse T-wave inversions, no old EKG to compare.  - f/u echo, will need either invasive vs noninvasive ischemic testing pending echo results and clinical course - will make npo tomorrow, ischemic testing based on echo findings. Based on his troponin and EKG would likely favor cath, even if LVEF is normal.    4. DM2 - Hgb A1c is 7, he has history of diet controlled diabetes  Signed, Dina Rich, MD  01/04/2017, 9:43 AM

## 2017-01-04 NOTE — Progress Notes (Addendum)
Echo reviewed, LVEF 30-35%. New diagnosis of acute systolic HF for the patient. We will d/c amlodopine, start coreg 6.25mg  bid starting tomorrow. Continue hydralazine at this time, pending renal function after cath  either add nitrates or possibly stop hydral to start ACE-I after cath tomorrow. Given his presentation we will plan for RHC/LHC tomorrow.    I have reviewed the risks, indications, and alternatives to cardiac catheterization, possible angioplasty, and stenting with the patient today. Risks include but are not limited to bleeding, infection, vascular injury, stroke, myocardial infection, arrhythmia, kidney injury, radiation-related injury in the case of prolonged fluoroscopy use, emergency cardiac surgery, and death. The patient understands the risks of serious complication is 1-2 in 1000 with diagnostic cardiac cath and 1-2% or less with angioplasty/stenting.    Dominga Ferry MD

## 2017-01-04 NOTE — Progress Notes (Signed)
  Echocardiogram 2D Echocardiogram has been performed.  Rodney Kane 01/04/2017, 12:08 PM

## 2017-01-05 ENCOUNTER — Encounter (HOSPITAL_COMMUNITY)
Admission: EM | Disposition: A | Discharge status: Home / Self Care | Payer: Self-pay | Source: Home / Self Care | Attending: Internal Medicine

## 2017-01-05 ENCOUNTER — Encounter (HOSPITAL_COMMUNITY): Payer: Self-pay | Admitting: Cardiology

## 2017-01-05 DIAGNOSIS — R778 Other specified abnormalities of plasma proteins: Secondary | ICD-10-CM

## 2017-01-05 DIAGNOSIS — I10 Essential (primary) hypertension: Secondary | ICD-10-CM

## 2017-01-05 DIAGNOSIS — R7989 Other specified abnormal findings of blood chemistry: Secondary | ICD-10-CM

## 2017-01-05 DIAGNOSIS — R748 Abnormal levels of other serum enzymes: Secondary | ICD-10-CM

## 2017-01-05 DIAGNOSIS — I5041 Acute combined systolic (congestive) and diastolic (congestive) heart failure: Secondary | ICD-10-CM

## 2017-01-05 HISTORY — PX: RIGHT/LEFT HEART CATH AND CORONARY ANGIOGRAPHY: CATH118266

## 2017-01-05 LAB — GLUCOSE, CAPILLARY
GLUCOSE-CAPILLARY: 163 mg/dL — AB (ref 65–99)
GLUCOSE-CAPILLARY: 224 mg/dL — AB (ref 65–99)
Glucose-Capillary: 141 mg/dL — ABNORMAL HIGH (ref 65–99)
Glucose-Capillary: 180 mg/dL — ABNORMAL HIGH (ref 65–99)

## 2017-01-05 LAB — POCT I-STAT 3, VENOUS BLOOD GAS (G3P V)
Acid-Base Excess: 5 mmol/L — ABNORMAL HIGH (ref 0.0–2.0)
Acid-Base Excess: 5 mmol/L — ABNORMAL HIGH (ref 0.0–2.0)
Bicarbonate: 29.8 mmol/L — ABNORMAL HIGH (ref 20.0–28.0)
Bicarbonate: 29.9 mmol/L — ABNORMAL HIGH (ref 20.0–28.0)
O2 Saturation: 67 %
O2 Saturation: 71 %
PCO2 VEN: 44 mmHg (ref 44.0–60.0)
PCO2 VEN: 44.5 mmHg (ref 44.0–60.0)
PH VEN: 7.435 — AB (ref 7.250–7.430)
PH VEN: 7.438 — AB (ref 7.250–7.430)
PO2 VEN: 34 mmHg (ref 32.0–45.0)
TCO2: 31 mmol/L (ref 0–100)
TCO2: 31 mmol/L (ref 0–100)
pO2, Ven: 36 mmHg (ref 32.0–45.0)

## 2017-01-05 LAB — BASIC METABOLIC PANEL
ANION GAP: 11 (ref 5–15)
BUN: 19 mg/dL (ref 6–20)
CALCIUM: 9.9 mg/dL (ref 8.9–10.3)
CO2: 30 mmol/L (ref 22–32)
Chloride: 95 mmol/L — ABNORMAL LOW (ref 101–111)
Creatinine, Ser: 1.43 mg/dL — ABNORMAL HIGH (ref 0.61–1.24)
GFR calc Af Amer: >60 mL/min (ref >60)
GFR, EST NON AFRICAN AMERICAN: 60 mL/min — AB (ref >60)
GLUCOSE: 133 mg/dL — AB (ref 65–99)
Potassium: 4.3 mmol/L (ref 3.5–5.1)
Sodium: 136 mmol/L (ref 135–145)

## 2017-01-05 LAB — CBC
HCT: 48 % (ref 39.0–52.0)
Hemoglobin: 16 g/dL (ref 13.0–17.0)
MCH: 26.3 pg (ref 26.0–34.0)
MCHC: 33.3 g/dL (ref 30.0–36.0)
MCV: 78.9 fL (ref 78.0–100.0)
PLATELETS: 247 10*3/uL (ref 150–400)
RBC: 6.08 MIL/uL — ABNORMAL HIGH (ref 4.22–5.81)
RDW: 14.3 % (ref 11.5–15.5)
WBC: 10.6 10*3/uL — AB (ref 4.0–10.5)

## 2017-01-05 LAB — POCT I-STAT 3, ART BLOOD GAS (G3+)
ACID-BASE EXCESS: 4 mmol/L — AB (ref 0.0–2.0)
BICARBONATE: 28.3 mmol/L — AB (ref 20.0–28.0)
O2 Saturation: 95 %
TCO2: 29 mmol/L (ref 0–100)
pCO2 arterial: 39.9 mmHg (ref 32.0–48.0)
pH, Arterial: 7.458 — ABNORMAL HIGH (ref 7.350–7.450)
pO2, Arterial: 70 mmHg — ABNORMAL LOW (ref 83.0–108.0)

## 2017-01-05 LAB — PROTIME-INR
INR: 1.03
Prothrombin Time: 13.5 seconds (ref 11.4–15.2)

## 2017-01-05 LAB — POCT ACTIVATED CLOTTING TIME: Activated Clotting Time: 186 seconds

## 2017-01-05 LAB — HIV ANTIBODY (ROUTINE TESTING W REFLEX): HIV SCREEN 4TH GENERATION: NONREACTIVE

## 2017-01-05 SURGERY — RIGHT/LEFT HEART CATH AND CORONARY ANGIOGRAPHY
Anesthesia: LOCAL

## 2017-01-05 MED ORDER — VERAPAMIL HCL 2.5 MG/ML IV SOLN
INTRAVENOUS | Status: AC
  Filled 2017-01-05: qty 2

## 2017-01-05 MED ORDER — HEPARIN SODIUM (PORCINE) 1000 UNIT/ML IJ SOLN
INTRAMUSCULAR | Status: AC
  Filled 2017-01-05: qty 1

## 2017-01-05 MED ORDER — HEPARIN (PORCINE) IN NACL 2-0.9 UNIT/ML-% IJ SOLN
INTRAMUSCULAR | Status: AC
  Filled 2017-01-05: qty 1000

## 2017-01-05 MED ORDER — LIDOCAINE HCL (PF) 1 % IJ SOLN
INTRAMUSCULAR | Status: DC | PRN
  Administered 2017-01-05: 2 mL

## 2017-01-05 MED ORDER — MIDAZOLAM HCL 2 MG/2ML IJ SOLN
INTRAMUSCULAR | Status: AC
  Filled 2017-01-05: qty 2

## 2017-01-05 MED ORDER — SODIUM CHLORIDE 0.9 % IV SOLN: 250.0000 mL | INTRAVENOUS | Status: DC | PRN

## 2017-01-05 MED ORDER — IOPAMIDOL (ISOVUE-370) INJECTION 76%
INTRAVENOUS | Status: DC | PRN
  Administered 2017-01-05: 85 mL via INTRA_ARTERIAL

## 2017-01-05 MED ORDER — SODIUM CHLORIDE 0.9% FLUSH
3.0000 mL | Freq: Two times a day (BID) | INTRAVENOUS | Status: DC
  Administered 2017-01-05 – 2017-01-06 (×2): 3 mL via INTRAVENOUS

## 2017-01-05 MED ORDER — LIDOCAINE HCL (PF) 1 % IJ SOLN
INTRAMUSCULAR | Status: AC
  Filled 2017-01-05: qty 30

## 2017-01-05 MED ORDER — MIDAZOLAM HCL 2 MG/2ML IJ SOLN
INTRAMUSCULAR | Status: DC | PRN
  Administered 2017-01-05: 1 mg via INTRAVENOUS

## 2017-01-05 MED ORDER — SODIUM CHLORIDE 0.9% FLUSH: 3.0000 mL | INTRAVENOUS | Status: DC | PRN

## 2017-01-05 MED ORDER — HEPARIN SODIUM (PORCINE) 1000 UNIT/ML IJ SOLN
INTRAMUSCULAR | Status: DC | PRN
  Administered 2017-01-05: 6000 [IU] via INTRAVENOUS

## 2017-01-05 MED ORDER — SODIUM CHLORIDE 0.9 % IV SOLN: INTRAVENOUS | Status: AC

## 2017-01-05 MED ORDER — IOPAMIDOL (ISOVUE-370) INJECTION 76%
INTRAVENOUS | Status: AC
  Filled 2017-01-05: qty 100

## 2017-01-05 MED ORDER — FENTANYL CITRATE (PF) 100 MCG/2ML IJ SOLN
INTRAMUSCULAR | Status: DC | PRN
  Administered 2017-01-05 (×2): 25 ug via INTRAVENOUS

## 2017-01-05 MED ORDER — CARVEDILOL 12.5 MG PO TABS
12.5000 mg | ORAL_TABLET | Freq: Two times a day (BID) | ORAL | Status: DC
Start: 2017-01-05 — End: 2017-01-06
  Administered 2017-01-05 – 2017-01-06 (×2): 12.5 mg via ORAL
  Filled 2017-01-05 (×2): qty 1

## 2017-01-05 MED ORDER — VERAPAMIL HCL 2.5 MG/ML IV SOLN
INTRAVENOUS | Status: DC | PRN
  Administered 2017-01-05: 10 mL via INTRA_ARTERIAL

## 2017-01-05 MED ORDER — FENTANYL CITRATE (PF) 100 MCG/2ML IJ SOLN
INTRAMUSCULAR | Status: AC
  Filled 2017-01-05: qty 2

## 2017-01-05 MED ORDER — HEPARIN (PORCINE) IN NACL 2-0.9 UNIT/ML-% IJ SOLN
INTRAMUSCULAR | Status: AC | PRN
  Administered 2017-01-05: 1000 mL

## 2017-01-05 MED ORDER — HEPARIN SODIUM (PORCINE) 1000 UNIT/ML IJ SOLN
INTRAMUSCULAR | Status: AC
Start: 2017-01-05 — End: 2017-01-05
  Filled 2017-01-05: qty 1

## 2017-01-05 SURGICAL SUPPLY — 12 items
CATH BALLN WEDGE 5F 110CM (CATHETERS) ×2 IMPLANT
CATH EXPO 5F FL3.5 (CATHETERS) ×2 IMPLANT
CATH OPTITORQUE TIG 4.0 5F (CATHETERS) ×2 IMPLANT
DEVICE RAD COMP TR BAND LRG (VASCULAR PRODUCTS) ×2 IMPLANT
GLIDESHEATH SLEND A-KIT 6F 22G (SHEATH) ×2 IMPLANT
GUIDEWIRE INQWIRE 1.5J.035X260 (WIRE) ×1 IMPLANT
INQWIRE 1.5J .035X260CM (WIRE) ×2
KIT HEART LEFT (KITS) ×2 IMPLANT
PACK CARDIAC CATHETERIZATION (CUSTOM PROCEDURE TRAY) ×2 IMPLANT
SHEATH GLIDE SLENDER 4/5FR (SHEATH) ×2 IMPLANT
TRANSDUCER W/STOPCOCK (MISCELLANEOUS) ×2 IMPLANT
TUBING CIL FLEX 10 FLL-RA (TUBING) ×2 IMPLANT

## 2017-01-05 NOTE — Progress Notes (Signed)
To cath lab via bed.

## 2017-01-05 NOTE — Interval H&P Note (Signed)
History and Physical Interval Note:  01/05/2017 7:36 AM  Rodney Kane  has presented today for surgery, with the diagnosis of unstable angina / Acute Combined Systolic & Diastolic HF with Accelerated Hypertension.   The various methods of treatment have been discussed with the patient and family. After consideration of risks, benefits and other options for treatment, the patient has consented to  Procedure(s): Right/Left Heart Cath and Coronary Angiography (N/A) with possible Percutaneous Coronary Intervention  as a surgical intervention .  The patient's history has been reviewed, patient examined, no change in status, stable for surgery.  I have reviewed the patient's chart and labs.  Questions were answered to the patient's satisfaction.    Cath Lab Visit (complete for each Cath Lab visit)  Clinical Evaluation Leading to the Procedure:   ACS: Yes.    Non-ACS:    Anginal Classification: CCS III  Anti-ischemic medical therapy: Minimal Therapy (1 class of medications)  Non-Invasive Test Results: Intermediate-risk stress test findings: cardiac mortality 1-3%/year - Severely reduced LVEF by Echo  Prior CABG: No previous CABG   [name]

## 2017-01-05 NOTE — Progress Notes (Addendum)
Site area: RT A/C Site Prior to Removal:  Level 0 Pressure Applied For:10 min Manual:   yes Patient Status During Pull:  stable Post Pull Site:  Level Post Pull Instructions Given:  yes Post Pull Pulses Present:  Dressing Applied:  tegaderm Bedrest begins @ per TR band protocal Comments:removed by Army Melia

## 2017-01-05 NOTE — Plan of Care (Signed)
Problem: Activity: Goal: Capacity to carry out activities will improve Outcome: Progressing Some dyspnea on excertion  Problem: Education: Goal: Ability to demonstrate managment of disease process will improve Outcome: Progressing For cardiac cath today. Will have a better understanding of extent of disease after cath

## 2017-01-05 NOTE — H&P (View-Only) (Signed)
Echo reviewed, LVEF 30-35%. New diagnosis of acute systolic HF for the patient. We will d/c amlodopine, start coreg 6.25mg bid starting tomorrow. Continue hydralazine at this time, pending renal function after cath  either add nitrates or possibly stop hydral to start ACE-I after cath tomorrow. Given his presentation we will plan for RHC/LHC tomorrow.    I have reviewed the risks, indications, and alternatives to cardiac catheterization, possible angioplasty, and stenting with the patient today. Risks include but are not limited to bleeding, infection, vascular injury, stroke, myocardial infection, arrhythmia, kidney injury, radiation-related injury in the case of prolonged fluoroscopy use, emergency cardiac surgery, and death. The patient understands the risks of serious complication is 1-2 in 1000 with diagnostic cardiac cath and 1-2% or less with angioplasty/stenting.    J Branch MD 

## 2017-01-05 NOTE — Progress Notes (Signed)
lovonox due. 40mg  Cardiology called to make sure this was alright given that patient will have cath today. MD confirmed it was ok to give.

## 2017-01-05 NOTE — Progress Notes (Signed)
Progress Note  Patient Name: Rodney Kane Date of Encounter: 01/05/2017  Primary Cardiologist: Dr. Herbie Baltimore  Subjective   Has a headache post cath.  Inpatient Medications    Scheduled Meds: . aspirin EC  81 mg Oral Daily  . carvedilol  12.5 mg Oral BID WC  . enoxaparin (LOVENOX) injection  40 mg Subcutaneous Q24H  . furosemide  40 mg Intravenous Daily  . hydrALAZINE  37.5 mg Oral TID  . insulin aspart  0-15 Units Subcutaneous TID WC  . insulin aspart  0-5 Units Subcutaneous QHS  . sodium chloride flush  3 mL Intravenous Q12H  . sodium chloride flush  3 mL Intravenous Q12H   Continuous Infusions: . sodium chloride    . sodium chloride 100 mL/hr at 01/05/17 1030  . sodium chloride    . nitroGLYCERIN 0 mcg/min (01/03/17 1600)   PRN Meds: sodium chloride, sodium chloride, acetaminophen, ondansetron (ZOFRAN) IV, sodium chloride flush, sodium chloride flush   Vital Signs    Vitals:   01/05/17 1015 01/05/17 1100 01/05/17 1130 01/05/17 1212  BP: (!) 167/93 (!) 148/92 (!) 157/94 (!) 147/97  Pulse: 88 91 87 95  Resp:      Temp:    98.2 F (36.8 C)  TempSrc:    Oral  SpO2: 93% 94% (!) 87% 93%  Weight:      Height:        Intake/Output Summary (Last 24 hours) at 01/05/17 1237 Last data filed at 01/05/17 1030  Gross per 24 hour  Intake              395 ml  Output             2575 ml  Net            -2180 ml   Filed Weights   01/03/17 0500 01/04/17 0604 01/05/17 0442  Weight: (!) 357 lb (161.9 kg) (!) 350 lb (158.8 kg) (!) 350 lb 1.5 oz (158.8 kg)    Telemetry    NSR - Personally Reviewed  ECG    NSR, diffuse T wave changes on 6/15 - Personally Reviewed  Physical Exam   GEN: Requesting dark room due to headache Neck: No JVD Cardiac: RRR, no murmurs, rubs, or gallops.  Respiratory: Clear to auscultation bilaterally. GI: Soft, nontender, non-distended  MS: No edema; No deformity. Right wrist and right brachial area stable Neuro:  Nonfocal  Psych: Normal  affect   Labs    Chemistry Recent Labs Lab 01/02/17 1651  01/03/17 0417 01/04/17 0212 01/05/17 0157  NA  --   --  137 136 136  K  --   --  4.4 4.3 4.3  CL  --   --  99* 94* 95*  CO2  --   --  28 31 30   GLUCOSE  --   --  109* 118* 133*  BUN  --   --  12 13 19   CREATININE  --   < > 1.25* 1.44* 1.43*  CALCIUM  --   --  9.3 9.7 9.9  PROT 7.3  --   --   --   --   ALBUMIN 4.1  --   --   --   --   AST 26  --   --   --   --   ALT 33  --   --   --   --   ALKPHOS 49  --   --   --   --  BILITOT 0.7  --   --   --   --   GFRNONAA  --   < > >60 59* 60*  GFRAA  --   < > >60 >60 >60  ANIONGAP  --   --  10 11 11   < > = values in this interval not displayed.   Hematology Recent Labs Lab 01/02/17 0800 01/02/17 2230 01/05/17 1018  WBC 7.9 8.4 10.6*  RBC 5.39 5.03 6.08*  HGB 14.0 13.1 16.0  HCT 43.5 40.8 48.0  MCV 80.7 81.1 78.9  MCH 26.0 26.0 26.3  MCHC 32.2 32.1 33.3  RDW 14.5 14.4 14.3  PLT 278 254 247    Cardiac Enzymes Recent Labs Lab 01/02/17 2230 01/03/17 0417 01/03/17 1128 01/04/17 0212  TROPONINI 0.14* 0.18* 0.10* 0.07*    Recent Labs Lab 01/02/17 0814 01/02/17 1256  TROPIPOC 0.29* 0.20*     BNP Recent Labs Lab 01/02/17 0800  BNP 493.4*     DDimer No results for input(s): DDIMER in the last 168 hours.   Radiology    No results found.  Cardiac Studies   No significant CAD by cath  Patient Profile     41 y.o. male with NICM  Assessment & Plan    NICM: May be related to HTN.  Increase carvedilol.  Consider adding ACE-I based on tomorrow renal function.    Elevated troponin likely related to heart failure: He has diuresed.  Reasses volume status 24 hours post cath.  LVEDP was normal at cath.   Signed, Lance Muss, MD  01/05/2017, 12:37 PM

## 2017-01-05 NOTE — Progress Notes (Signed)
Pt diaphoretic, c/o HA 7/10, nausea.  Voids .  Dr. Eldridge Dace at bedside and aware.  New orders re: Coreg dosing.  Pt given emesis basin; previously medicated w/Tylenol for HA; and given Zofran 4mg  IVP per PRN orders  BP 148/92; P89; sats 95% on RA. Will continue to monitor.

## 2017-01-05 NOTE — Progress Notes (Signed)
Pt states he's fallen once in the last six months, yellow socks on, fall arm band, and pt educated on bed alarm, pt refused

## 2017-01-06 LAB — BASIC METABOLIC PANEL
ANION GAP: 9 (ref 5–15)
BUN: 21 mg/dL — ABNORMAL HIGH (ref 6–20)
CALCIUM: 9.2 mg/dL (ref 8.9–10.3)
CO2: 30 mmol/L (ref 22–32)
CREATININE: 1.42 mg/dL — AB (ref 0.61–1.24)
Chloride: 96 mmol/L — ABNORMAL LOW (ref 101–111)
GFR calc Af Amer: >60 mL/min (ref >60)
GLUCOSE: 151 mg/dL — AB (ref 65–99)
Potassium: 4.3 mmol/L (ref 3.5–5.1)
Sodium: 135 mmol/L (ref 135–145)

## 2017-01-06 LAB — GLUCOSE, CAPILLARY
Glucose-Capillary: 176 mg/dL — ABNORMAL HIGH (ref 65–99)
Glucose-Capillary: 114 mg/dL — ABNORMAL HIGH (ref 65–99)

## 2017-01-06 MED ORDER — CARVEDILOL 12.5 MG PO TABS: 12.5000 mg | ORAL_TABLET | Freq: Two times a day (BID) | ORAL | 2 refills | Status: DC

## 2017-01-06 MED ORDER — FUROSEMIDE 20 MG PO TABS: 20.0000 mg | ORAL_TABLET | Freq: Every day | ORAL | 2 refills | Status: DC

## 2017-01-06 MED ORDER — HYDRALAZINE HCL 25 MG PO TABS: 37.5000 mg | ORAL_TABLET | Freq: Three times a day (TID) | ORAL | 1 refills | Status: DC

## 2017-01-06 MED ORDER — ASPIRIN 81 MG PO TBEC: 81.0000 mg | DELAYED_RELEASE_TABLET | Freq: Every day | ORAL | Status: DC

## 2017-01-06 NOTE — Discharge Instructions (Signed)
Rodney Kane has been admitted at [facility] from 01/05/17 - 01/06/17. He is not to return to work until cleared at his follow up appointment with his physician in 2 weeks.  

## 2017-01-06 NOTE — Progress Notes (Signed)
Patient discharge instructions given to patient and reviewed. Diet, meds, follow up appointments, activity and incision care reviewed and all questions answered. Prescriptions sent to pharmacy. Patient discharged via wheelchair with friend.

## 2017-01-06 NOTE — Progress Notes (Signed)
Heart Failure Navigator Consult Note  Presentation: Rodney Kane is a 41 year old male with history of diet controlled diabetes, hypertension (was taking HCTZ, stopped 1 year ago) who is admitted with elevated troponin.  The patient tells me that he was in his normal state of health until two weeks ago. At baseline, he is active. 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 He developed cold-like symptoms that were accompanied by shortness of breath, productive cough (mucus), central chest pressure, and left sided pleuritic chest pain. After a few days the cold-like sympoms improved (sore throat, fatigue) but the other symptoms have progressed over about two weeks (they improved mid-week only to worsen the past couple days). He tells me that he has had no significant edema, but has noticed ~20 lb weight increase over about one week. No orthopnea or PND. He had one episode of presyncope about one month ago, but cannot provide any details. He denies fevers, chills, travel. One episode of diarrhea, otherwise no n/v/d. No abd pain. No rashes or swollen joints.  At work today he was struggling to breath due to wheezing and was told to visit the ED at [facility]. In the ED his BP was elevated (SBP > 200 mmHg) and he was tachycardic (110-120s). He was not hypoxic. He received quite a bit of relief with nebulizers, and was placed on nitro drip. Dr. Rennis Golden was contacted for transfer. Here his BP is better controlled and he is asymptomatic.  Past Medical History:  Diagnosis Date  . Diabetes mellitus without complication (HCC)   . Hypertension     Social History   Social History  . Marital status: Single    Spouse name: N/A  . Number of children: N/A  . Years of education: N/A   Social History Main Topics  . Smoking status: Never Smoker  . Smokeless tobacco: Never Used  . Alcohol use No  . Drug use: No  . Sexual activity: Not Asked   Other Topics Concern  .  None   Social History Narrative  . None    ECHO:Study Conclusions-01/04/17  - Left ventricle: The cavity size was normal. Wall thickness was   increased in a pattern of moderate LVH. Systolic function was   moderately to severely reduced. The estimated ejection fraction   was in the range of 30% to 35%. Diastolic function is abnormal,   indeterminant grade. Diffuse hypokinesis. - Aortic valve: Mildly calcified annulus. Trileaflet; mildly   thickened leaflets. Valve area (VTI): 2.51 cm^2. Valve area   (Vmax): 2.43 cm^2. Valve area (Vmean): 2.46 cm^2.  ------------------------------------------------------------------- Study data:  No prior study was available for comparison.  Study status:  Routine.  Procedure:  The patient reported no pain Rodney or post test. Transthoracic echocardiography. Image quality was poor. The study was technically difficult, as a result of poor sound wave transmission and body habitus.  Study completion:  There were no complications.          Transthoracic echocardiography.  M-mode, complete 2D, spectral Doppler, and color Doppler.  Birthdate: Patient birthdate: May 29, 1976.  Age:  Patient is 42 yr old.  Sex: Gender: male.    BMI: 46.2 kg/m^2.  Blood pressure:     124/76 Patient status:  Inpatient.  Study date:  Study date: 01/04/2017. Study time: 11:27 AM.  Location:  ICU/CCU  -------------------------------------------------------------------  ------------------------------------------------------------------- Left ventricle:  The cavity size was normal. Wall thickness was increased in a pattern of  moderate LVH. Systolic function was moderately to severely reduced. The estimated ejection fraction was in the range of 30% to 35%. Diastolic function is abnormal, indeterminant grade. Diffuse hypokinesis.  Cardiac Catheterization: Conclusion     Angiographically normal coronary arteries  LV end diastolic pressure is normal - indicating adequate  diuresis  Hemodynamic findings not consistent with pulmonary hypertension - with normal PCL bpm PA pressures.   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 Given his presentation with uncontrolled hypertension, most consistent with hypertensive cardiomyopathy. He has been adequately diuresed however. Blood pressures are still borderline, however he is lying flat.     BNP    Component Value Date/Time   BNP 493.4 (H) 01/02/2017 0800    ProBNP No results found for: PROBNP   Education Assessment and Provision:  Detailed education and instructions provided on heart failure disease management including the following:  Signs and symptoms of Heart Failure When to call the physician Importance of daily weights Low sodium diet Fluid restriction Medication management Anticipated future follow-up appointments  Patient education given on each of the above topics.  Patient acknowledges understanding and acceptance of all instructions.  I spoke with Rodney Kane regardng his recent HF diagnosis and current hospitalization.  He tells me that he has been feeling fine until recently after what he thought was an "upper resp. infection".  I reviewed HF diagnosis and symptoms.  He has a scale at home.  I reinforced the importance of daily weights and when to contact the physician.  We discussed a low sodium diet and high sodium foods to avoid.  He admits that he eats a "single man's diet"---eating out very frequently.  I encouraged avoiding fast food secondary to sodium content and to begin reading labels.  He currently does not have any insurance-yet says that he will be able to afford medications.  He is concerned about returning to work and says that his 75 day probationary period will restart because of this hospitalization.  He will likely follow with [facility] -yet could benefit from HF Clinic for LCSW referral for ongoing financial concerns.  Education Materials:  "Living Better With Heart Failure"  Booklet, Daily Weight Tracker Tool    High Risk Criteria for Readmission and/or Poor Patient Outcomes:    EF <30%- 30-35%  2 or more admissions in 6 months-No  Difficult social situation- Yes no insurance noted  Demonstrates medication noncompliance- yes - had stopped taking meds prior to admission    Barriers of Care:  New HF--Knowledge and compliance  Discharge Planning:   Plans to return to home alone.  He would benefit from Paramedicine referral if transitions to HF Clinic.

## 2017-01-06 NOTE — Discharge Summary (Signed)
Discharge Summary    Patient ID: Rodney Kane,  MRN: [id number], DOB/AGE: [birth year] 42 y.o.  Admit date: 01/02/2017 Discharge date: 01/06/2017  Primary Care Provider: Patient, No Pcp Per Primary Cardiologist: Herbie Baltimore  Discharge Diagnoses    Active Problems:   CHF (congestive heart failure) (HCC)   Malignant hypertensive urgency   Diabetes (HCC)   Acute combined systolic and diastolic heart failure (HCC)   Elevated troponin   Allergies No Known Allergies  Diagnostic Studies/Procedures   TTE: 01/04/17  Study Conclusions  - Left ventricle: The cavity size was normal. Wall thickness was   increased in a pattern of moderate LVH. Systolic function was   moderately to severely reduced. The estimated ejection fraction   was in the range of 30% to 35%. Diastolic function is abnormal,   indeterminant grade. Diffuse hypokinesis.   LHC: 01/05/17  Conclusion     Angiographically normal coronary arteries  LV end diastolic pressure is normal - indicating adequate diuresis  Hemodynamic findings not consistent with pulmonary hypertension - with normal PCL bpm PA pressures.   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 Given his presentation with uncontrolled hypertension, most consistent with hypertensive cardiomyopathy. He has been adequately diuresed however. Blood pressures are still borderline, however he is lying flat.   Plan:   Brachial sheath removed in PACU holding area  Return to nursing unit for ongoing care  Defer follow-up care to primary team   _____________   History of Present Illness     Rodney Kane is a 41 year old male with history of diet controlled diabetes, hypertension (was taking HCTZ, stopped 1 year ago) who was admitted with elevated troponin.  He reported that he was in his normal state of health until two weeks ago. At baseline, he is active. 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 He developed cold-like  symptoms that were accompanied by shortness of breath, productive cough (mucus), central chest pressure, and left sided pleuritic chest pain. After a few days the cold-like sympoms improved (sore throat, fatigue) but the other symptoms have progressed over about two weeks (they improved mid-week only to worsen the past couple days). Stated that he has had no significant edema, but has noticed ~20 lb weight increase over about one week. No orthopnea or PND. He had one episode of presyncope about one month ago, but cannot provide any details. He denied any fevers, chills, travel. One episode of diarrhea, otherwise no n/v/d. No abd pain. No rashes or swollen joints.  At work on the day of admission he was struggling to breath due to wheezing and was told to visit the ED at [facility]. In the ED his BP was elevated (SBP > 200 mmHg) and he was tachycardic (110-120s). He was not hypoxic. He received quite a bit of relief with nebulizers, and was placed on nitro drip. Dr. Rennis Golden was contacted for transfer.On arrival to [facility] his BP was better controlled and he was asymptomatic.  Hospital Course     He was started on norvasc 10mg  daily and hydralazine 37.5mg  TID and weaned from nitro gtt. Troponin peaked at 0.29, and EKG showed diffuse TWI, with no old to compare. Echo showed reduced EF of 30-35% with diffuse hypokinesis. He was diuresed with IV lasix with improvement in his breathing, and downward trend in weights. Given new finding of reduced EF, he underwent cardiac cath showing normal coronaries, and normal filling pressures. Hgb A1c was 7 this admission, with a  hx of diet controlled diabetes. Amlodipine was stopped and started on coreg 6.25mg  BID and titrated up to 12.5mg  BID 2/2 echo findings. Post cath labs showed Cr 1.42 (baseline) and Hgb 16.0. His breathing remained stable. Had a headache post cath that improved the following day. Plan for Lasix 20mg  daily at the time of discharge. No ACEi/ARB given Cr, but consider  at follow up if Cr improved.   He was seen by Dr. Eldridge Dace and determined stable for discharge home. Follow up in the office has been arranged. Medications are listed below.   General:Obese AAF male appearing in no acute distress. Head: Normocephalic, atraumatic.  Neck: Supple without bruits, JVD. Lungs:  Resp regular and unlabored, CTA. Heart: RRR, S1, S2, no S3, S4, or murmur; no rub. Abdomen: Soft, non-tender, non-distended with normoactive bowel sounds. No hepatomegaly. No rebound/guarding. No obvious abdominal masses. Extremities: No clubbing, cyanosis, edema. Distal pedal pulses are 2+ bilaterally. R radial cath site stable without bruising or hematoma Neuro: Alert and oriented X 3. Moves all extremities spontaneously. Psych: Normal affect.  _____________  Discharge Vitals Blood pressure 113/78, pulse 83, temperature 98.2 F (36.8 C), temperature source Oral, resp. rate 18, height 6\' 1"  (1.854 m), weight (!) 348 lb 11.2 oz (158.2 kg), SpO2 97 %.  Filed Weights   01/05/17 0442 01/05/17 1642 01/06/17 0158  Weight: (!) 350 lb 1.5 oz (158.8 kg) (!) 347 lb (157.4 kg) (!) 348 lb 11.2 oz (158.2 kg)    Labs & Radiologic Studies    CBC  Recent Labs  01/05/17 1018  WBC 10.6*  HGB 16.0  HCT 48.0  MCV 78.9  PLT 247   Basic Metabolic Panel  Recent Labs  01/05/17 0157 01/06/17 0343  NA 136 135  K 4.3 4.3  CL 95* 96*  CO2 30 30  GLUCOSE 133* 151*  BUN 19 21*  CREATININE 1.43* 1.42*  CALCIUM 9.9 9.2   Liver Function Tests No results for input(s): AST, ALT, ALKPHOS, BILITOT, PROT, ALBUMIN in the last 72 hours. No results for input(s): LIPASE, AMYLASE in the last 72 hours. Cardiac Enzymes  Recent Labs  01/04/17 0212  TROPONINI 0.07*   BNP Invalid input(s): POCBNP D-Dimer No results for input(s): DDIMER in the last 72 hours. Hemoglobin A1C No results for input(s): HGBA1C in the last 72 hours. Fasting Lipid Panel No results for input(s): CHOL, HDL, LDLCALC,  TRIG, CHOLHDL, LDLDIRECT in the last 72 hours. Thyroid Function Tests No results for input(s): TSH, T4TOTAL, T3FREE, THYROIDAB in the last 72 hours.  Invalid input(s): FREET3 _____________  Dg Chest 2 View  Result Date: 01/02/2017 CLINICAL DATA:  41 year old male with shortness of breath for 2 weeks. Illness with cough. Chest pressure, pain for 1 week. EXAM: CHEST  2 VIEW COMPARISON:  None. FINDINGS: Cardiomegaly. Other mediastinal contours are within normal limits. Visualized tracheal air column is within normal limits. Increased pulmonary interstitial opacity. Possible trace pleural fluid in the fissures. No pneumothorax or layering effusion. No confluent pulmonary opacity. No acute osseous abnormality identified. Negative visible bowel gas pattern. IMPRESSION: Cardiomegaly, with pulmonary parenchymal appearance suggestive of interstitial edema. Consider congestive heart failure. Electronically Signed   By: Odessa Fleming M.D.   On: 01/02/2017 08:22   Disposition   Pt is being discharged home today in good condition.  Follow-up Plans & Appointments    Follow-up Information    [facility] Follow up on 01/14/2017.   Specialty:  Internal Medicine Why:  Follow up appointment to  establish primary care is scheduled Wednesday 6/27 at 9:45am  Contact information: [address] [place] [address] [phone]       [facility], [place] Follow up on 01/15/2017.   Specialties:  Cardiology, Radiology Why:  at 2:30pm for your follow up appt.  Contact information: [address] [place] [address] [phone]          Discharge Instructions    (HEART FAILURE PATIENTS) Call MD:  Anytime you have any of the following symptoms: 1) 3 pound weight gain in 24 hours or 5 pounds in 1 week 2) shortness of breath, with or without a dry hacking cough 3) swelling in the hands, feet or stomach 4) if you have to sleep on extra pillows at night in order to breathe.     Complete by:  As directed    Call MD for:  redness, tenderness, or signs of infection (pain, swelling, redness, odor or green/yellow discharge around incision site)    Complete by:  As directed    Diet - low sodium heart healthy    Complete by:  As directed    Discharge instructions    Complete by:  As directed    Radial Site Care Refer to this sheet in the next few weeks. These instructions provide you with information on caring for yourself after your procedure. Your caregiver may also give you more specific instructions. Your treatment has been planned according to current medical practices, but problems sometimes occur. Call your caregiver if you have any problems or questions after your procedure. HOME CARE INSTRUCTIONS You may shower the day after the procedure.Remove the bandage (dressing) and gently wash the site with plain soap and water.Gently pat the site dry.  Do not apply powder or lotion to the site.  Do not submerge the affected site in water for 3 to 5 days.  Inspect the site at least twice daily.  Do not flex or bend the affected arm for 24 hours.  No lifting over 5 pounds (2.3 kg) for 5 days after your procedure.  Do not drive home if you are discharged the same day of the procedure. Have someone else drive you.  You may drive 24 hours after the procedure unless otherwise instructed by your caregiver.  What to expect: Any bruising will usually fade within 1 to 2 weeks.  Blood that collects in the tissue (hematoma) may be painful to the touch. It should usually decrease in size and tenderness within 1 to 2 weeks.  SEEK IMMEDIATE MEDICAL CARE IF: You have unusual pain at the radial site.  You have redness, warmth, swelling, or pain at the radial site.  You have drainage (other than a small amount of blood on the dressing).  You have chills.  You have a fever or persistent symptoms for more than 72 hours.  You have a fever and your symptoms suddenly get worse.  Your  arm becomes pale, cool, tingly, or numb.  You have heavy bleeding from the site. Hold pressure on the site.  For patients with congestive heart failure, we give them these special instructions:  1. Follow a low-salt diet and watch your fluid intake. In general, you should not be taking in more than 2 liters of fluid per day (no more than 8 glasses per day). Some patients are restricted to less than 1.5 liters of fluid per day (no more than 6 glasses per day). This includes sources of water in foods like  soup, coffee, tea, milk, etc. 2. Weigh yourself on the same scale at same time of day and keep a log. 3. Call your doctor: (Anytime you feel any of the following symptoms)  - 3-4 pound weight gain in 1-2 days or 2 pounds overnight  - Shortness of breath, with or without a dry hacking cough  - Swelling in the hands, feet or stomach  - If you have to sleep on extra pillows at night in order to breathe   IT IS IMPORTANT TO LET YOUR DOCTOR KNOW EARLY ON IF YOU ARE HAVING SYMPTOMS SO WE CAN HELP YOU!   Increase activity slowly    Complete by:  As directed       Discharge Medications   Current Discharge Medication List    START taking these medications   Details  aspirin EC 81 MG EC tablet Take 1 tablet (81 mg total) by mouth daily.    carvedilol (COREG) 12.5 MG tablet Take 1 tablet (12.5 mg total) by mouth 2 (two) times daily with a meal. Qty: 60 tablet, Refills: 2    furosemide (LASIX) 20 MG tablet Take 1 tablet (20 mg total) by mouth daily. Qty: 30 tablet, Refills: 2    hydrALAZINE (APRESOLINE) 25 MG tablet Take 1.5 tablets (37.5 mg total) by mouth 3 (three) times daily. Qty: 90 tablet, Refills: 1         Outstanding Labs/Studies   BMET at follow up  Duration of Discharge Encounter   Greater than 30 minutes including physician time.  Signed, Laverda Page NP-C 01/06/2017, 2:06 PM  I have examined the patient and reviewed assessment and plan and discussed with patient.   Agree with above as stated.  RRR.  Lungs clear to auscultation bilaterally.  Right radial site stable without hematoma.  2+ right radial pulse.  Nonischemic cardiomyopathy.  BP controlled.  ACE-I not started due to mild renal insufficiency and recent contrast exposure.  THis could be started as an outpatient if  Renal function stays stable.  Spoke to him about weight loss, medication compliance and regular exercise.  Plan for discharge later today.  Lance Muss

## 2017-01-06 NOTE — Progress Notes (Signed)
Rodney Kane has been admitted at [facility] from 01/05/17 - 01/06/17. He is not to return to work until cleared at his follow up appointment with his physician in 2 weeks.

## 2017-01-14 ENCOUNTER — Ambulatory Visit: Payer: Self-pay | Admitting: Family Medicine

## 2017-01-14 ENCOUNTER — Telehealth (HOSPITAL_COMMUNITY): Payer: Self-pay | Admitting: Surgery

## 2017-01-14 NOTE — Telephone Encounter (Signed)
Heart Failure Nurse Navigator Post Discharge Telephone Call  I spoke with Rodney Kane regarding his HF and recent hospitalization.  He tells me that he is feeling much better.  When I asked about daily weights--he says he has been weighing yet has not weighed yet today.  I again encouraged weights at the same time each day--preferably in the AM and only once per day.  I also reviewed when to contact the physician.  I also reviewed adherence to a low sodium diet and high sodium foods to avoid.  He denies any issues with getting or taking prescribed medications.  He has a scheduled appt for follow-up tomorrow at [facility].  I encouraged him to call me back with any concerns or questions regardng his HF.

## 2017-01-15 ENCOUNTER — Ambulatory Visit (INDEPENDENT_AMBULATORY_CARE_PROVIDER_SITE_OTHER): Payer: Self-pay | Admitting: Physician Assistant

## 2017-01-15 ENCOUNTER — Encounter: Payer: Self-pay | Admitting: Physician Assistant

## 2017-01-15 VITALS — BP 143/82 | HR 100 | Ht 73.0 in | Wt 357.0 lb

## 2017-01-15 DIAGNOSIS — I10 Essential (primary) hypertension: Secondary | ICD-10-CM

## 2017-01-15 DIAGNOSIS — N182 Chronic kidney disease, stage 2 (mild): Secondary | ICD-10-CM

## 2017-01-15 DIAGNOSIS — Z79899 Other long term (current) drug therapy: Secondary | ICD-10-CM

## 2017-01-15 DIAGNOSIS — E119 Type 2 diabetes mellitus without complications: Secondary | ICD-10-CM

## 2017-01-15 DIAGNOSIS — I428 Other cardiomyopathies: Secondary | ICD-10-CM

## 2017-01-15 MED ORDER — LOSARTAN POTASSIUM 25 MG PO TABS: 25.0000 mg | ORAL_TABLET | Freq: Every day | ORAL | 3 refills | Status: DC

## 2017-01-15 NOTE — Progress Notes (Signed)
Cardiology Office Note    Date:  01/16/2017   ID:  Rodney Kane, DOB 08/10/1975, MRN [id number]  PCP:  Patient, No Pcp Per  Cardiologist:  Dr. Herbie Baltimore  Chief Complaint  Patient presents with  . Follow-up    POST [facility]. Dr. Herbie Baltimore    History of Present Illness:  Rodney Kane is a 41 y.o. male with PMH of diet controlled diabetes and HTN who recently presented to the ED on 01/02/2017 with dyspnea. Initial blood pressure was elevated at over 200. He was also tachycardic with heart rate 110 to 120s. His troponin was mildly elevated at 0.18. He was admitted for hypertensive urgency. Echocardiogram obtained on 01/04/2017 showed EF 30-35%, moderate LVH, diffuse hypokinesis. He underwent cardiac catheterization on 01/05/2017 which showed angiographically normal coronaries. Mean wedge pressure was 15 mmHg, RV pressure 32/2 mmHg. His nonischemic cardiomyopathy was felt to be related to uncontrolled hypertension. His elevated troponin was felt to be demand ischemia related to heart failure. His symptoms improved after diuresis. His weight improved from 357 pounds down to 348 pounds upon discharge. His creatinine did go up to 1.4 from 1.2 after diuresis.  He presents today for cardiology office visit. He denies any chest discomfort or shortness of breath. He does not have significant lower extremity edema, I think he is euvolemic based on physical exam. We discussed the need to control his blood pressure given likely hypertension-induced cardiomyopathy.Unfortunately he woke up late today and only took his blood pressure medication roughly 30 minutes before clinic arrival. Therefore his blood pressure is elevated in the clinic, unfortunately do not know how his blood pressure usually run after taking blood pressure medication. I plan to start losartan 25 mg daily on top of his carvedilol. He is on hydralazine, I decided not to add Imdur as he had significant headache in the hospital on IV nitroglycerin. We  will obtain a basic metabolic panel today and also in one week to assess renal function and electrolytes. 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   Past Medical History:  Diagnosis Date  . Diabetes mellitus without complication (HCC)   . Hypertension     Past Surgical History:  Procedure Laterality Date  . left knee surgery    . RIGHT/LEFT HEART CATH AND CORONARY ANGIOGRAPHY N/A 01/05/2017   Procedure: Right/Left Heart Cath and Coronary Angiography;  Surgeon: [name], MD;  Location: [facility] INVASIVE CV LAB;  Service: Cardiovascular;  Laterality: N/A;    Current Medications: Outpatient Medications Prior to Visit  Medication Sig Dispense Refill  . aspirin EC 81 MG EC tablet Take 1 tablet (81 mg total) by mouth daily.    . carvedilol (COREG) 12.5 MG tablet Take 1 tablet (12.5 mg total) by mouth 2 (two) times daily with a meal. 60 tablet 2  . furosemide (LASIX) 20 MG tablet Take 1 tablet (20 mg total) by mouth daily. 30 tablet 2  . hydrALAZINE (APRESOLINE) 25 MG tablet Take 1.5 tablets (37.5 mg total) by mouth 3 (three) times daily. 90 tablet 1   No facility-administered medications prior to visit.      Allergies:   Patient has no known allergies.   Social History   Social History  . Marital status: Single    Spouse name: N/A  . Number of children: N/A  . Years of education: N/A   Social History Main Topics  . Smoking status: Never Smoker  .  Smokeless tobacco: Never Used  . Alcohol use No  . Drug use: No  . Sexual activity: Not Asked   Other Topics Concern  . None   Social History Narrative  . None     Family History:  The patient's family history includes Diabetic kidney disease in his mother; Hypertension in his mother; Multiple sclerosis in his mother.   ROS:   Please see the history of present illness.    ROS All other systems reviewed and are negative.   PHYSICAL  EXAM:   VS:  BP (!) 143/82 (BP Location: Right Arm, Patient Position: Sitting, Cuff Size: Large)   Pulse 100   Ht 6\' 1"  (1.854 m)   Wt (!) 357 lb (161.9 kg)   BMI 47.10 kg/m    GEN: Well nourished, well developed, in no acute distress  HEENT: normal  Neck: no JVD, carotid bruits, or masses Cardiac: RRR; no murmurs, rubs, or gallops,no edema  Respiratory:  clear to auscultation bilaterally, normal work of breathing GI: soft, nontender, nondistended, + BS MS: no deformity or atrophy  Skin: warm and dry, no rash Neuro:  Alert and Oriented x 3, Strength and sensation are intact Psych: euthymic mood, full affect  Wt Readings from Last 3 Encounters:  01/15/17 (!) 357 lb (161.9 kg)  01/06/17 (!) 348 lb 11.2 oz (158.2 kg)      Studies/Labs Reviewed:   EKG:  EKG is not ordered today.    Recent Labs: 01/02/2017: ALT 33; B Natriuretic Peptide 493.4; TSH 0.553 01/05/2017: Hemoglobin 16.0; Platelets 247 01/06/2017: BUN 21; Creatinine, Ser 1.42; Potassium 4.3; Sodium 135   Lipid Panel    Component Value Date/Time   CHOL 171 01/03/2017 0417   TRIG 70 01/03/2017 0417   HDL 42 01/03/2017 0417   CHOLHDL 4.1 01/03/2017 0417   VLDL 14 01/03/2017 0417   LDLCALC 115 (H) 01/03/2017 0417    Additional studies/ records that were reviewed today include:   Echo 01/04/2017 LV EF: 30% -   35%  Study Conclusions  - Left ventricle: The cavity size was normal. Wall thickness was   increased in a pattern of moderate LVH. Systolic function was   moderately to severely reduced. The estimated ejection fraction   was in the range of 30% to 35%. Diastolic function is abnormal,   indeterminant grade. Diffuse hypokinesis. - Aortic valve: Mildly calcified annulus. Trileaflet; mildly   thickened leaflets. Valve area (VTI): 2.51 cm^2. Valve area   (Vmax): 2.43 cm^2. Valve area (Vmean): 2.46 cm^2.    Cath 01/05/2017 Conclusion     Angiographically normal coronary arteries  LV end diastolic  pressure is normal - indicating adequate diuresis  Hemodynamic findings not consistent with pulmonary hypertension - with normal PCL bpm PA pressures.   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 Given his presentation with uncontrolled hypertension, most consistent with hypertensive cardiomyopathy. He has been adequately diuresed however. Blood pressures are still borderline, however he is lying flat.   Plan:   Brachial sheath removed in PACU holding area  Return to nursing unit for ongoing care  Defer follow-up care to primary team      ASSESSMENT:    1. NICM (nonischemic cardiomyopathy) (HCC)   2. Medication management   3. Essential hypertension   4. Diet-controlled diabetes mellitus (HCC)   5. CKD (chronic kidney disease), stage II      PLAN:  In order of problems listed above:  1. Nonischemic cardiomyopathy: Recently admitted for acute on chronic systolic and diastolic heart  failure in the setting of uncontrolled hypertension. Cardiac catheterization did not reveal any coronary artery disease. His symptom is likely resulting from chronic uncontrolled hypertension. He was started on carvedilol in the hospital and also hydralazine. I'm hesitant to start on Imdur as he had significant headache on IV nitroglycerin in the hospital. I will however add losartan 25 mg daily. He has likely stage II CKD, I will obtain basic metabolic panel today and also in one week to follow renal function and electrolyte after initiation of ARB  2. Hypertension: Blood pressure still elevated today, however he did not take his home blood pressure medication until 30 minutes prior to office arrival. I added losartan 25 mg daily today. If he is blood pressure is able to tolerate, I would increase carvedilol to 25 mg twice a day on the next office visit. His heart rate is still borderline tachycardic in the low 100 range, this is likely related to nonischemic cardiomyopathy.  3. Diet-controlled diabetes  mellitus: Recent hemoglobin A1c was 7.0. He does not have a primary care physician, he will need to establish with one.  4. CKD stage II: Recent baseline creatinine 1.4-1.5. He likely have CKD stage II at this point. This is likely related to uncontrolled high blood pressure as well.   Medication Adjustments/Labs and Tests Ordered: Current medicines are reviewed at length with the patient today.  Concerns regarding medicines are outlined above.  Medication changes, Labs and Tests ordered today are listed in the Patient Instructions below. Patient Instructions  Medication Instructions:  START LOSARTAN 25 MG DAILY  If you need a refill on your cardiac medications before your next appointment, please call your pharmacy.  Labwork: BMP TODAY AND IN ONE WEEK (01-22-2018) HERE IN OUR OFFICE AT [facility]  Follow-Up: Your physician wants you to follow-up in: I MONTH WITH Doree Kuehne, PA-C    Thank you for choosing [facility] at [employer], [place]  01/16/2017 12:13 AM    [facility] [address] [place], [place], [place]  [address] Phone: [phone]; Fax: [phone]

## 2017-01-15 NOTE — Patient Instructions (Signed)
Medication Instructions:  START LOSARTAN 25 MG DAILY  If you need a refill on your cardiac medications before your next appointment, please call your pharmacy.  Labwork: BMP TODAY AND IN ONE WEEK (01-22-2018) HERE IN OUR OFFICE AT [facility]  Follow-Up: Your physician wants you to follow-up in: I MONTH WITH HAO MENG, PA-C    Thank you for choosing [facility] at [facility]!!

## 2017-01-16 ENCOUNTER — Other Ambulatory Visit: Payer: Self-pay | Admitting: *Deleted

## 2017-01-16 ENCOUNTER — Encounter: Payer: Self-pay | Admitting: Physician Assistant

## 2017-01-16 DIAGNOSIS — Z79899 Other long term (current) drug therapy: Secondary | ICD-10-CM

## 2017-01-16 DIAGNOSIS — N182 Chronic kidney disease, stage 2 (mild): Secondary | ICD-10-CM | POA: Insufficient documentation

## 2017-01-16 DIAGNOSIS — I428 Other cardiomyopathies: Secondary | ICD-10-CM | POA: Insufficient documentation

## 2017-01-16 LAB — BASIC METABOLIC PANEL
BUN/Creatinine Ratio: 12 (ref 9–20)
BUN: 15 mg/dL (ref 6–24)
CALCIUM: 10 mg/dL (ref 8.7–10.2)
CO2: 24 mmol/L (ref 20–29)
CREATININE: 1.25 mg/dL (ref 0.76–1.27)
Chloride: 101 mmol/L (ref 96–106)
GFR calc Af Amer: 82 mL/min/{1.73_m2} (ref >59)
GFR, EST NON AFRICAN AMERICAN: 71 mL/min/{1.73_m2} (ref >59)
GLUCOSE: 119 mg/dL — AB (ref 65–99)
POTASSIUM: 5.7 mmol/L — AB (ref 3.5–5.2)
SODIUM: 143 mmol/L (ref 134–144)

## 2017-01-20 LAB — BASIC METABOLIC PANEL
BUN / CREAT RATIO: 12 (ref 9–20)
BUN: 13 mg/dL (ref 6–24)
CHLORIDE: 99 mmol/L (ref 96–106)
CO2: 26 mmol/L (ref 20–29)
Calcium: 9.8 mg/dL (ref 8.7–10.2)
Creatinine, Ser: 1.13 mg/dL (ref 0.76–1.27)
GFR, EST AFRICAN AMERICAN: 93 mL/min/{1.73_m2} (ref >59)
GFR, EST NON AFRICAN AMERICAN: 80 mL/min/{1.73_m2} (ref >59)
Glucose: 120 mg/dL — ABNORMAL HIGH (ref 65–99)
POTASSIUM: 5.1 mmol/L (ref 3.5–5.2)
Sodium: 139 mmol/L (ref 134–144)

## 2017-01-20 NOTE — Progress Notes (Signed)
1 week repeat labs shows electrolyte normalized

## 2017-01-22 ENCOUNTER — Encounter: Payer: Self-pay | Admitting: Physician Assistant

## 2017-01-29 ENCOUNTER — Encounter: Payer: Self-pay | Admitting: Family Medicine

## 2017-01-29 ENCOUNTER — Ambulatory Visit (INDEPENDENT_AMBULATORY_CARE_PROVIDER_SITE_OTHER): Payer: Self-pay | Admitting: Family Medicine

## 2017-01-29 VITALS — BP 142/80 | HR 86 | Temp 98.5°F | Resp 18 | Ht 73.0 in | Wt 361.0 lb

## 2017-01-29 DIAGNOSIS — I5041 Acute combined systolic (congestive) and diastolic (congestive) heart failure: Secondary | ICD-10-CM

## 2017-01-29 DIAGNOSIS — I10 Essential (primary) hypertension: Secondary | ICD-10-CM

## 2017-01-29 DIAGNOSIS — E118 Type 2 diabetes mellitus with unspecified complications: Secondary | ICD-10-CM

## 2017-01-29 LAB — POCT URINALYSIS DIP (DEVICE)
Bilirubin Urine: NEGATIVE
GLUCOSE, UA: NEGATIVE mg/dL
Ketones, ur: NEGATIVE mg/dL
Leukocytes, UA: NEGATIVE
Nitrite: NEGATIVE
Protein, ur: NEGATIVE mg/dL
SPECIFIC GRAVITY, URINE: 1.025 (ref 1.005–1.030)
UROBILINOGEN UA: 0.2 mg/dL (ref 0.0–1.0)
pH: 5 (ref 5.0–8.0)

## 2017-01-29 MED ORDER — ASPIRIN 81 MG PO TBEC: 81.0000 mg | DELAYED_RELEASE_TABLET | Freq: Every day | ORAL | Status: DC

## 2017-01-29 MED ORDER — METFORMIN HCL 500 MG PO TABS: 500.0000 mg | ORAL_TABLET | Freq: Two times a day (BID) | ORAL | 1 refills | Status: DC

## 2017-01-29 MED ORDER — LOSARTAN POTASSIUM 25 MG PO TABS: 25.0000 mg | ORAL_TABLET | Freq: Every day | ORAL | 3 refills | Status: DC

## 2017-01-29 MED ORDER — HYDRALAZINE HCL 50 MG PO TABS: 50.0000 mg | ORAL_TABLET | Freq: Three times a day (TID) | ORAL | 2 refills | Status: DC

## 2017-01-29 MED ORDER — FUROSEMIDE 20 MG PO TABS: 20.0000 mg | ORAL_TABLET | Freq: Every day | ORAL | 1 refills | Status: DC

## 2017-01-29 MED ORDER — CARVEDILOL 12.5 MG PO TABS: 12.5000 mg | ORAL_TABLET | Freq: Two times a day (BID) | ORAL | 2 refills | Status: DC

## 2017-01-29 NOTE — Progress Notes (Signed)
Patient ID: Brittney Caraway, male    DOB: September 06, 1975, 41 y.o.   MRN: [id number]  PCP: Bing Neighbors, FNP  Chief Complaint  Patient presents with  . Establish Care  . Hospitalization Follow-up    Subjective:  HPI Teagan Heidrick is a 41 y.o. male presents to establish care and hospital follow-up. Medical problems include congestive heart failure, malignant hypertension, type 2 diabetes, and morbid obesity. Jed was admitted to [facility] with the diagnosis of elevated troponins on 01/02/2017. He reported that he had previously been prescribed hypertension medications but stopped taking them over a year ago. He has complained of a 2 week history of worsening shortness of breath, productive mucus-like cough, etc. last chest pain, fatigue, and approximately a 20 pound weight gain over a seven-day period. He was found to be extensively hypertensive and was started on amlodipine 10 mg daily and hydralazine 37.5 mg 3 times a day. He had an echo performed which showed a reduced ejection fraction of 30-35%. His hemoglobin A1c was 7. A right heart cath was performed, which indicated no coronary artery blockages, and revealed nonischemic cardiomyopathy as the likely cause for heart failure. Today he reports improved work of breathing, no chest pain, and complete compliance with medications. 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 Social History   Social History  . Marital status: Single    Spouse name: N/A  . Number of children: N/A  . Years of education: N/A   Occupational History  . Not on file.   Social History Main Topics  . Smoking status: Never Smoker  . Smokeless tobacco: Never Used  . Alcohol use No  . Drug use: No  . Sexual activity: Not on file   Other Topics Concern  . Not on file   Social History Narrative  . No narrative on file    Family History  Problem Relation Age of Onset  . Multiple  sclerosis Mother   . Diabetic kidney disease Mother   . Hypertension Mother    Review of Systems See history of present illness Patient Active Problem List   Diagnosis Date Noted  . NICM (nonischemic cardiomyopathy) (HCC) 01/16/2017  . 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 Acute combined systolic and diastolic heart failure (HCC)   . Elevated troponin   . CHF (congestive heart failure) (HCC) 01/02/2017  . 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 Diabetes (HCC) 01/02/2017    No Known Allergies  Prior to Admission medications   Medication Sig Start Date End Date Taking? Authorizing Provider  aspirin EC 81 MG EC tablet Take 1 tablet (81 mg total) by mouth daily. 01/07/17  Yes Arty Baumgartner, NP  carvedilol (COREG) 12.5 MG tablet Take 1 tablet (12.5 mg total) by mouth 2 (two) times daily with a meal. 01/06/17  Yes Laverda Page B, NP  furosemide (LASIX) 20 MG tablet Take 1 tablet (20 mg total) by mouth daily. 01/06/17 01/06/18 Yes Arty Baumgartner, NP  hydrALAZINE (APRESOLINE) 25 MG tablet Take 1.5 tablets (37.5 mg total) by mouth 3 (three) times daily. 01/06/17  Yes Arty Baumgartner, NP  losartan (COZAAR) 25 MG tablet Take 1 tablet (25 mg total) by mouth daily. 01/15/17 04/15/17 Yes Azalee Course, PA    Past Medical, Surgical Family and Social History reviewed and updated.    Objective:   Today's Vitals   01/29/17 1458  BP: (!) 142/80  Pulse: 86  Resp: 18  Temp: 98.5 F (36.9 C)  TempSrc: Oral  SpO2: 100%  Weight: (!) 361 lb (163.7 kg)  Height: 6\' 1"  (1.854 m)    Wt Readings from Last 3 Encounters:  01/29/17 (!) 361 lb (163.7 kg)  01/15/17 (!) 357 lb (161.9 kg)  01/06/17 (!) 348 lb 11.2 oz (158.2 kg)    Physical Exam  Constitutional: He is oriented to person, place, and time. He appears well-developed and well-nourished.  HENT:  Head: Normocephalic and atraumatic.  Eyes: Pupils are equal, round, and reactive to light. Conjunctivae and EOM are  normal.  Neck: Normal range of motion. Neck supple.  Cardiovascular: Normal rate, regular rhythm, normal heart sounds and intact distal pulses.   Pulmonary/Chest: Effort normal and breath sounds normal.  Musculoskeletal: Normal range of motion.  Neurological: He is alert and oriented to person, place, and time.  Skin: Skin is warm and dry.  Psychiatric: He has a normal mood and affect. His behavior is normal. Judgment and thought content normal.    Assessment & Plan:  1. Acute combined systolic and diastolic heart failure (HCC) - Brain natriuretic peptide -Continue carvedilol 12.5 mg twice a day 2. Essential hypertension Increase hydralazine from 37.5 mg to 50 mg 3 times daily Continue losartan 25 mg daily  3. Type 2 diabetes mellitus with complication, without long-term current use of insulin (HCC) -Hemoglobin A1c of 7.0 -Start taking metformin 500 mg twice a day with meals.  Letter provided permitting return to work without restriction.  RTC: 3 months diabetes, hypertension,CMP, CBC, BNP, lipid check-labs future status pending.   Godfrey Pick. Tiburcio Pea, MSN, FNP-C [facility]  [address] [place], [place] [address] [phone]

## 2017-01-29 NOTE — Patient Instructions (Addendum)
Complete the [place] patient assistance form in order for me to send you for a sleep study.  Continue all medications as prescribed. Make efforts to loose weight.  Your hemoglobin A1C is 7.0, I am restarting your Metformin 500 mg twice daily with meals.   Heart Failure Heart failure means your heart has trouble pumping blood. This makes it hard for your body to work well. Heart failure is usually a long-term (chronic) condition. You must take good care of yourself and follow your doctor's treatment plan. Follow these instructions at home:  Take your heart medicine as told by your doctor. ? Do not stop taking medicine unless your doctor tells you to. ? Do not skip any dose of medicine. ? Refill your medicines before they run out. ? Take other medicines only as told by your doctor or pharmacist.  Stay active if told by your doctor. The elderly and people with severe heart failure should talk with a doctor about physical activity.  Eat heart-healthy foods. Choose foods that are without trans fat and are low in saturated fat, cholesterol, and salt (sodium). This includes fresh or frozen fruits and vegetables, fish, lean meats, fat-free or low-fat dairy foods, whole grains, and high-fiber foods. Lentils and dried peas and beans (legumes) are also good choices.  Limit salt if told by your doctor.  Cook in a healthy way. Roast, grill, broil, bake, poach, steam, or stir-fry foods.  Limit fluids as told by your doctor.  Weigh yourself every morning. Do this after you pee (urinate) and before you eat breakfast. Write down your weight to give to your doctor.  Take your blood pressure and write it down if your doctor tells you to.  Ask your doctor how to check your pulse. Check your pulse as told.  Lose weight if told by your doctor.  Stop smoking or chewing tobacco. Do not use gum or patches that help you quit without your doctor's approval.  Schedule and go to doctor visits as  told.  Nonpregnant women should have no more than 1 drink a day. Men should have no more than 2 drinks a day. Talk to your doctor about drinking alcohol.  Stop illegal drug use.  Stay current with shots (immunizations).  Manage your health conditions as told by your doctor.  Learn to manage your stress.  Rest when you are tired.  If it is really hot outside: ? Avoid intense activities. ? Use air conditioning or fans, or get in a cooler place. ? Avoid caffeine and alcohol. ? Wear loose-fitting, lightweight, and light-colored clothing.  If it is really cold outside: ? Avoid intense activities. ? Layer your clothing. ? Wear mittens or gloves, a hat, and a scarf when going outside. ? Avoid alcohol.  Learn about heart failure and get support as needed.  Get help to maintain or improve your quality of life and your ability to care for yourself as needed. Contact a doctor if:  You gain weight quickly.  You are more short of breath than usual.  You cannot do your normal activities.  You tire easily.  You cough more than normal, especially with activity.  You have any or more puffiness (swelling) in areas such as your hands, feet, ankles, or belly (abdomen).  You cannot sleep because it is hard to breathe.  You feel like your heart is beating fast (palpitations).  You get dizzy or light-headed when you stand up. Get help right away if:  You have trouble breathing.  There is a change in mental status, such as becoming less alert or not being able to focus.  You have chest pain or discomfort.  You faint. This information is not intended to replace advice given to you by your health care provider. Make sure you discuss any questions you have with your health care provider. Document Released: 04/15/2008 Document Revised: 12/13/2015 Document Reviewed: 08/23/2012 Elsevier Interactive Patient Education  2017 Elsevier Inc.  DASH Eating Plan DASH stands for "Dietary  Approaches to Stop Hypertension." The DASH eating plan is a healthy eating plan that has been shown to reduce high blood pressure (hypertension). It may also reduce your risk for type 2 diabetes, heart disease, and stroke. The DASH eating plan may also help with weight loss. What are tips for following this plan? General guidelines  Avoid eating more than 2,300 mg (milligrams) of salt (sodium) a day. If you have hypertension, you may need to reduce your sodium intake to 1,500 mg a day.  Limit alcohol intake to no more than 1 drink a day for nonpregnant women and 2 drinks a day for men. One drink equals 12 oz of beer, 5 oz of wine, or 1 oz of hard liquor.  Work with your health care provider to maintain a healthy body weight or to lose weight. Ask what an ideal weight is for you.  Get at least 30 minutes of exercise that causes your heart to beat faster (aerobic exercise) most days of the week. Activities may include walking, swimming, or biking.  Work with your health care provider or diet and nutrition specialist (dietitian) to adjust your eating plan to your individual calorie needs. Reading food labels  Check food labels for the amount of sodium per serving. Choose foods with less than 5 percent of the Daily Value of sodium. Generally, foods with less than 300 mg of sodium per serving fit into this eating plan.  To find whole grains, look for the word "whole" as the first word in the ingredient list. Shopping  Buy products labeled as "low-sodium" or "no salt added."  Buy fresh foods. Avoid canned foods and premade or frozen meals. Cooking  Avoid adding salt when cooking. Use salt-free seasonings or herbs instead of table salt or sea salt. Check with your health care provider or pharmacist before using salt substitutes.  Do not fry foods. Cook foods using healthy methods such as baking, boiling, grilling, and broiling instead.  Cook with heart-healthy oils, such as olive, canola,  soybean, or sunflower oil. Meal planning   Eat a balanced diet that includes: ? 5 or more servings of fruits and vegetables each day. At each meal, try to fill half of your plate with fruits and vegetables. ? Up to 6-8 servings of whole grains each day. ? Less than 6 oz of lean meat, poultry, or fish each day. A 3-oz serving of meat is about the same size as a deck of cards. One egg equals 1 oz. ? 2 servings of low-fat dairy each day. ? A serving of nuts, seeds, or beans 5 times each week. ? Heart-healthy fats. Healthy fats called Omega-3 fatty acids are found in foods such as flaxseeds and coldwater fish, like sardines, salmon, and mackerel.  Limit how much you eat of the following: ? Canned or prepackaged foods. ? Food that is high in trans fat, such as fried foods. ? Food that is high in saturated fat, such as fatty meat. ? Sweets, desserts, sugary drinks, and other foods  with added sugar. ? Full-fat dairy products.  Do not salt foods before eating.  Try to eat at least 2 vegetarian meals each week.  Eat more home-cooked food and less restaurant, buffet, and fast food.  When eating at a restaurant, ask that your food be prepared with less salt or no salt, if possible. What foods are recommended? The items listed may not be a complete list. Talk with your dietitian about what dietary choices are best for you. Grains Whole-grain or whole-wheat bread. Whole-grain or whole-wheat pasta. Brown rice. Orpah Cobb. Bulgur. Whole-grain and low-sodium cereals. Pita bread. Low-fat, low-sodium crackers. Whole-wheat flour tortillas. Vegetables Fresh or frozen vegetables (raw, steamed, roasted, or grilled). Low-sodium or reduced-sodium tomato and vegetable juice. Low-sodium or reduced-sodium tomato sauce and tomato paste. Low-sodium or reduced-sodium canned vegetables. Fruits All fresh, dried, or frozen fruit. Canned fruit in natural juice (without added sugar). Meat and other protein  foods Skinless chicken or [place]. Ground chicken or [place]. Pork with fat trimmed off. Fish and seafood. Egg whites. Dried beans, peas, or lentils. Unsalted nuts, nut butters, and seeds. Unsalted canned beans. Lean cuts of beef with fat trimmed off. Low-sodium, lean deli meat. Dairy Low-fat (1%) or fat-free (skim) milk. Fat-free, low-fat, or reduced-fat cheeses. Nonfat, low-sodium ricotta or cottage cheese. Low-fat or nonfat yogurt. Low-fat, low-sodium cheese. Fats and oils Soft margarine without trans fats. Vegetable oil. Low-fat, reduced-fat, or light mayonnaise and salad dressings (reduced-sodium). Canola, safflower, olive, soybean, and sunflower oils. Avocado. Seasoning and other foods Herbs. Spices. Seasoning mixes without salt. Unsalted popcorn and pretzels. Fat-free sweets. What foods are not recommended? The items listed may not be a complete list. Talk with your dietitian about what dietary choices are best for you. Grains Baked goods made with fat, such as croissants, muffins, or some breads. Dry pasta or rice meal packs. Vegetables Creamed or fried vegetables. Vegetables in a cheese sauce. Regular canned vegetables (not low-sodium or reduced-sodium). Regular canned tomato sauce and paste (not low-sodium or reduced-sodium). Regular tomato and vegetable juice (not low-sodium or reduced-sodium). Rosita Fire. Olives. Fruits Canned fruit in a light or heavy syrup. Fried fruit. Fruit in cream or butter sauce. Meat and other protein foods Fatty cuts of meat. Ribs. Fried meat. Tomasa Blase. Sausage. Bologna and other processed lunch meats. Salami. Fatback. Hotdogs. Bratwurst. Salted nuts and seeds. Canned beans with added salt. Canned or smoked fish. Whole eggs or egg yolks. Chicken or [place] with skin. Dairy Whole or 2% milk, cream, and half-and-half. Whole or full-fat cream cheese. Whole-fat or sweetened yogurt. Full-fat cheese. Nondairy creamers. Whipped toppings. Processed cheese and cheese  spreads. Fats and oils Butter. Stick margarine. Lard. Shortening. Ghee. Bacon fat. Tropical oils, such as coconut, palm kernel, or palm oil. Seasoning and other foods Salted popcorn and pretzels. Onion salt, garlic salt, seasoned salt, table salt, and sea salt. Worcestershire sauce. Tartar sauce. Barbecue sauce. Teriyaki sauce. Soy sauce, including reduced-sodium. Steak sauce. Canned and packaged gravies. Fish sauce. Oyster sauce. Cocktail sauce. Horseradish that you find on the shelf. Ketchup. Mustard. Meat flavorings and tenderizers. Bouillon cubes. Hot sauce and Tabasco sauce. Premade or packaged marinades. Premade or packaged taco seasonings. Relishes. Regular salad dressings. Where to find more information:  National Heart, Lung, and Blood Institute: PopSteam.is  American Heart Association: www.heart.org Summary  The DASH eating plan is a healthy eating plan that has been shown to reduce high blood pressure (hypertension). It may also reduce your risk for type 2 diabetes, heart disease, and stroke.  With the  DASH eating plan, you should limit salt (sodium) intake to 2,300 mg a day. If you have hypertension, you may need to reduce your sodium intake to 1,500 mg a day.  When on the DASH eating plan, aim to eat more fresh fruits and vegetables, whole grains, lean proteins, low-fat dairy, and heart-healthy fats.  Work with your health care provider or diet and nutrition specialist (dietitian) to adjust your eating plan to your individual calorie needs. This information is not intended to replace advice given to you by your health care provider. Make sure you discuss any questions you have with your health care provider. Document Released: 06/26/2011 Document Revised: 06/30/2016 Document Reviewed: 06/30/2016 Elsevier Interactive Patient Education  2017 Elsevier Inc.  Diabetes Mellitus and Exercise Exercising regularly is important for your overall health, especially when you have  diabetes (diabetes mellitus). Exercising is not only about losing weight. It has many health benefits, such as increasing muscle strength and bone density and reducing body fat and stress. This leads to improved fitness, flexibility, and endurance, all of which result in better overall health. Exercise has additional benefits for people with diabetes, including:  Reducing appetite.  Helping to lower and control blood glucose.  Lowering blood pressure.  Helping to control amounts of fatty substances (lipids) in the blood, such as cholesterol and triglycerides.  Helping the body to respond better to insulin (improving insulin sensitivity).  Reducing how much insulin the body needs.  Decreasing the risk for heart disease by: ? Lowering cholesterol and triglyceride levels. ? Increasing the levels of good cholesterol. ? Lowering blood glucose levels.  What is my activity plan? Your health care provider or certified diabetes educator can help you make a plan for the type and frequency of exercise (activity plan) that works for you. Make sure that you:  Do at least 150 minutes of moderate-intensity or vigorous-intensity exercise each week. This could be brisk walking, biking, or water aerobics. ? Do stretching and strength exercises, such as yoga or weightlifting, at least 2 times a week. ? Spread out your activity over at least 3 days of the week.  Get some form of physical activity every day. ? Do not go more than 2 days in a row without some kind of physical activity. ? Avoid being inactive for more than 90 minutes at a time. Take frequent breaks to walk or stretch.  Choose a type of exercise or activity that you enjoy, and set realistic goals.  Start slowly, and gradually increase the intensity of your exercise over time.  What do I need to know about managing my diabetes?  Check your blood glucose before and after exercising. ? If your blood glucose is higher than 240 mg/dL (16.1  mmol/L) before you exercise, check your urine for ketones. If you have ketones in your urine, do not exercise until your blood glucose returns to normal.  Know the symptoms of low blood glucose (hypoglycemia) and how to treat it. Your risk for hypoglycemia increases during and after exercise. Common symptoms of hypoglycemia can include: ? Hunger. ? Anxiety. ? Sweating and feeling clammy. ? Confusion. ? Dizziness or feeling light-headed. ? Increased heart rate or palpitations. ? Blurry vision. ? Tingling or numbness around the mouth, lips, or tongue. ? Tremors or shakes. ? Irritability.  Keep a rapid-acting carbohydrate snack available before, during, and after exercise to help prevent or treat hypoglycemia.  Avoid injecting insulin into areas of the body that are going to be exercised. For example, avoid  injecting insulin into: ? The arms, when playing tennis. ? The legs, when jogging.  Keep records of your exercise habits. Doing this can help you and your health care provider adjust your diabetes management plan as needed. Write down: ? Food that you eat before and after you exercise. ? Blood glucose levels before and after you exercise. ? The type and amount of exercise you have done. ? When your insulin is expected to peak, if you use insulin. Avoid exercising at times when your insulin is peaking.  When you start a new exercise or activity, work with your health care provider to make sure the activity is safe for you, and to adjust your insulin, medicines, or food intake as needed.  Drink plenty of water while you exercise to prevent dehydration or heat stroke. Drink enough fluid to keep your urine clear or pale yellow. This information is not intended to replace advice given to you by your health care provider. Make sure you discuss any questions you have with your health care provider. Document Released: 09/27/2003 Document Revised: 01/25/2016 Document Reviewed:  12/17/2015 Elsevier Interactive Patient Education  2018 [employer].

## 2017-02-11 ENCOUNTER — Encounter: Payer: Self-pay | Admitting: *Deleted

## 2017-02-11 ENCOUNTER — Ambulatory Visit: Payer: Self-pay | Admitting: Physician Assistant

## 2017-02-11 NOTE — Progress Notes (Deleted)
Cardiology Office Note    Date:  02/11/2017   ID:  Rodney Kane, DOB 03/01/1976, MRN [id number]  PCP:  Bing Neighbors, FNP  Cardiologist:  Dr. Herbie Baltimore  No chief complaint on file.   History of Present Illness:  Rodney Kane is a 41 y.o. male with PMH of diet controlled diabetes and HTN who recently presented to the ED on 01/02/2017 with dyspnea. Initial blood pressure was elevated at over 200. He was also tachycardic with heart rate 110 to 120s. His troponin was mildly elevated at 0.18. He was admitted for hypertensive urgency. Echocardiogram obtained on 01/04/2017 showed EF 30-35%, moderate LVH, diffuse hypokinesis. He underwent cardiac catheterization on 01/05/2017 which showed angiographically normal coronaries. Mean wedge pressure was 15 mmHg, RV pressure 32/2 mmHg. His nonischemic cardiomyopathy was felt to be related to uncontrolled hypertension. His elevated troponin was felt to be demand ischemia related to heart failure. His symptoms improved after diuresis. His weight improved from 357 pounds down to 348 pounds upon discharge. His creatinine did go up to 1.4 from 1.2 after diuresis.  He presents today for cardiology office visit. I last saw him on 01/15/2017, and added losartan 25 mg daily on top of his carvedilol. With his recently diagnosed cardiomyopathy, we'll need to control his blood pressure.  No EKG    Past Medical History:  Diagnosis Date  . Diabetes mellitus without complication (HCC)   . Hypertension     Past Surgical History:  Procedure Laterality Date  . left knee surgery    . RIGHT/LEFT HEART CATH AND CORONARY ANGIOGRAPHY N/A 01/05/2017   Procedure: Right/Left Heart Cath and Coronary Angiography;  Surgeon: [name], MD;  Location: [facility] INVASIVE CV LAB;  Service: Cardiovascular;  Laterality: N/A;    Current Medications: Outpatient Medications Prior to Visit  Medication Sig Dispense Refill  . aspirin 81 MG EC tablet Take 1 tablet (81 mg total) by  mouth daily. 30 tablet   . carvedilol (COREG) 12.5 MG tablet Take 1 tablet (12.5 mg total) by mouth 2 (two) times daily with a meal. 60 tablet 2  . furosemide (LASIX) 20 MG tablet Take 1 tablet (20 mg total) by mouth daily. 90 tablet 1  . hydrALAZINE (APRESOLINE) 50 MG tablet Take 1 tablet (50 mg total) by mouth 3 (three) times daily. 90 tablet 2  . losartan (COZAAR) 25 MG tablet Take 1 tablet (25 mg total) by mouth daily. 90 tablet 3  . metFORMIN (GLUCOPHAGE) 500 MG tablet Take 1 tablet (500 mg total) by mouth 2 (two) times daily with a meal. 180 tablet 1   No facility-administered medications prior to visit.      Allergies:   Patient has no known allergies.   Social History   Social History  . Marital status: Single    Spouse name: N/A  . Number of children: N/A  . Years of education: N/A   Social History Main Topics  . Smoking status: Never Smoker  . Smokeless tobacco: Never Used  . Alcohol use No  . Drug use: No  . Sexual activity: Not on file   Other Topics Concern  . Not on file   Social History Narrative  . No narrative on file     Family History:  The patient's ***family history includes Diabetic kidney disease in his mother; Hypertension in his mother; Multiple sclerosis in his mother.   ROS:   Please see the history of present illness.    ROS All other systems reviewed  and are negative.   PHYSICAL EXAM:   VS:  There were no vitals taken for this visit.   GEN: Well nourished, well developed, in no acute distress  HEENT: normal  Neck: no JVD, carotid bruits, or masses Cardiac: ***RRR; no murmurs, rubs, or gallops,no edema  Respiratory:  clear to auscultation bilaterally, normal work of breathing GI: soft, nontender, nondistended, + BS MS: no deformity or atrophy  Skin: warm and dry, no rash Neuro:  Alert and Oriented x 3, Strength and sensation are intact Psych: euthymic mood, full affect  Wt Readings from Last 3 Encounters:  01/29/17 (!) 361 lb (163.7  kg)  01/15/17 (!) 357 lb (161.9 kg)  01/06/17 (!) 348 lb 11.2 oz (158.2 kg)      Studies/Labs Reviewed:   EKG:  EKG is*** ordered today.  The ekg ordered today demonstrates ***  Recent Labs: 01/02/2017: ALT 33; B Natriuretic Peptide 493.4; TSH 0.553 01/05/2017: Hemoglobin 16.0; Platelets 247 01/19/2017: BUN 13; Creatinine, Ser 1.13; Potassium 5.1; Sodium 139   Lipid Panel    Component Value Date/Time   CHOL 171 01/03/2017 0417   TRIG 70 01/03/2017 0417   HDL 42 01/03/2017 0417   CHOLHDL 4.1 01/03/2017 0417   VLDL 14 01/03/2017 0417   LDLCALC 115 (H) 01/03/2017 0417    Additional studies/ records that were reviewed today include:   Echo 01/04/2017 LV EF: 30% - 35%  Study Conclusions  - Left ventricle: The cavity size was normal. Wall thickness was increased in a pattern of moderate LVH. Systolic function was moderately to severely reduced. The estimated ejection fraction was in the range of 30% to 35%. Diastolic function is abnormal, indeterminant grade. Diffuse hypokinesis. - Aortic valve: Mildly calcified annulus. Trileaflet; mildly thickened leaflets. Valve area (VTI): 2.51 cm^2. Valve area (Vmax): 2.43 cm^2. Valve area (Vmean): 2.46 cm^2.    Cath 01/05/2017 Conclusion     Angiographically normal coronary arteries  LV end diastolic pressure is normal - indicating adequate diuresis  Hemodynamic findings not consistent with pulmonary hypertension - with normal PCL bpm PA pressures.  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 Given his presentation with uncontrolled hypertension, most consistent with hypertensive cardiomyopathy. He has been adequately diuresed however. Blood pressures are still borderline, however he is lying flat.   Plan:   Brachial sheath removed in PACU holding area  Return to nursing unit for ongoing care  Defer follow-up care to primary team       ASSESSMENT:    No diagnosis found.   PLAN:  In order of  problems listed above:  1. ***    Medication Adjustments/Labs and Tests Ordered: Current medicines are reviewed at length with the patient today.  Concerns regarding medicines are outlined above.  Medication changes, Labs and Tests ordered today are listed in the Patient Instructions below. There are no Patient Instructions on file for this visit.   Ramond Dial, [place]  02/11/2017 9:28 AM    [facility] [address] [place], [place], [place]  [address] Phone: [phone]; Fax: [phone]

## 2017-05-05 ENCOUNTER — Ambulatory Visit: Payer: Self-pay | Admitting: Family Medicine

## 2017-09-26 ENCOUNTER — Emergency Department (HOSPITAL_COMMUNITY)
Admission: EM | Admit: 2017-09-26 | Discharge: 2017-09-26 | Disposition: A | Discharge status: Home / Self Care | Payer: BLUE CROSS/BLUE SHIELD | Attending: Emergency Medicine | Admitting: Emergency Medicine

## 2017-09-26 ENCOUNTER — Emergency Department (HOSPITAL_COMMUNITY): Payer: BLUE CROSS/BLUE SHIELD

## 2017-09-26 ENCOUNTER — Encounter (HOSPITAL_COMMUNITY): Payer: Self-pay | Admitting: Emergency Medicine

## 2017-09-26 DIAGNOSIS — R05 Cough: Secondary | ICD-10-CM | POA: Diagnosis not present

## 2017-09-26 DIAGNOSIS — E1122 Type 2 diabetes mellitus with diabetic chronic kidney disease: Secondary | ICD-10-CM | POA: Diagnosis not present

## 2017-09-26 DIAGNOSIS — I509 Heart failure, unspecified: Secondary | ICD-10-CM | POA: Diagnosis not present

## 2017-09-26 DIAGNOSIS — R0602 Shortness of breath: Secondary | ICD-10-CM | POA: Diagnosis not present

## 2017-09-26 DIAGNOSIS — I110 Hypertensive heart disease with heart failure: Secondary | ICD-10-CM | POA: Diagnosis not present

## 2017-09-26 DIAGNOSIS — N182 Chronic kidney disease, stage 2 (mild): Secondary | ICD-10-CM | POA: Insufficient documentation

## 2017-09-26 LAB — URINALYSIS, ROUTINE W REFLEX MICROSCOPIC
BACTERIA UA: NONE SEEN
Bilirubin Urine: NEGATIVE
Glucose, UA: NEGATIVE mg/dL
Ketones, ur: 5 mg/dL — AB
Leukocytes, UA: NEGATIVE
NITRITE: NEGATIVE
PROTEIN: 100 mg/dL — AB
SPECIFIC GRAVITY, URINE: 1.024 (ref 1.005–1.030)
pH: 5 (ref 5.0–8.0)

## 2017-09-26 LAB — CBC WITH DIFFERENTIAL/PLATELET
Basophils Absolute: 0 10*3/uL (ref 0.0–0.1)
Basophils Relative: 1 %
EOS ABS: 0.2 10*3/uL (ref 0.0–0.7)
EOS PCT: 2 %
HCT: 45.7 % (ref 39.0–52.0)
Hemoglobin: 14.6 g/dL (ref 13.0–17.0)
LYMPHS PCT: 44 %
Lymphs Abs: 3.9 10*3/uL (ref 0.7–4.0)
MCH: 26.1 pg (ref 26.0–34.0)
MCHC: 31.9 g/dL (ref 30.0–36.0)
MCV: 81.8 fL (ref 78.0–100.0)
MONO ABS: 0.6 10*3/uL (ref 0.1–1.0)
Monocytes Relative: 7 %
Neutro Abs: 4.1 10*3/uL (ref 1.7–7.7)
Neutrophils Relative %: 46 %
PLATELETS: 248 10*3/uL (ref 150–400)
RBC: 5.59 MIL/uL (ref 4.22–5.81)
RDW: 14.3 % (ref 11.5–15.5)
WBC: 8.8 10*3/uL (ref 4.0–10.5)

## 2017-09-26 LAB — COMPREHENSIVE METABOLIC PANEL
ALT: 41 U/L (ref 17–63)
ANION GAP: 10 (ref 5–15)
AST: 33 U/L (ref 15–41)
Albumin: 3.9 g/dL (ref 3.5–5.0)
Alkaline Phosphatase: 56 U/L (ref 38–126)
BUN: 10 mg/dL (ref 6–20)
CHLORIDE: 105 mmol/L (ref 101–111)
CO2: 26 mmol/L (ref 22–32)
CREATININE: 1.44 mg/dL — AB (ref 0.61–1.24)
Calcium: 9.1 mg/dL (ref 8.9–10.3)
GFR calc non Af Amer: 59 mL/min — ABNORMAL LOW (ref >60)
Glucose, Bld: 146 mg/dL — ABNORMAL HIGH (ref 65–99)
POTASSIUM: 3.9 mmol/L (ref 3.5–5.1)
SODIUM: 141 mmol/L (ref 135–145)
Total Bilirubin: 0.9 mg/dL (ref 0.3–1.2)
Total Protein: 7.1 g/dL (ref 6.5–8.1)

## 2017-09-26 LAB — BRAIN NATRIURETIC PEPTIDE: B NATRIURETIC PEPTIDE 5: 953.7 pg/mL — AB (ref 0.0–100.0)

## 2017-09-26 LAB — I-STAT TROPONIN, ED: TROPONIN I, POC: 0.02 ng/mL (ref 0.00–0.08)

## 2017-09-26 MED ORDER — FUROSEMIDE 10 MG/ML IJ SOLN
40.0000 mg | Freq: Once | INTRAMUSCULAR | Status: AC
  Administered 2017-09-26: 40 mg via INTRAVENOUS
  Filled 2017-09-26: qty 4

## 2017-09-26 MED ORDER — FUROSEMIDE 20 MG PO TABS: 20.0000 mg | ORAL_TABLET | Freq: Every day | ORAL | 0 refills | Status: DC

## 2017-09-26 NOTE — ED Provider Notes (Signed)
Emergency Department Provider Note   I have reviewed the triage vital signs and the nursing notes.   HISTORY  Chief Complaint Shortness of Breath   HPI Rodney Kane is a 42 y.o. male with PMH of DM, HTN, CKD, and NICM presents to the ED for evaluation of SOB and epigastric abdominal discomfort which has been ongoing but worsening over the last several weeks. He notes history of CHF and CKD but has been off of meds due to losing insurance. He recently got insurance back but does not have refills on meds. He has been complaining of exertional dyspnea for some time when his mom finally convinced him to present to the ED this evening. Denies any sudden worsening symptoms. Denies CP. No radiation of symptoms. He does note an epigastric fullness which seems worse after eating and with certain position changes.   Past Medical History:  Diagnosis Date  . Diabetes mellitus without complication (HCC)   . Hypertension     Patient Active Problem List   Diagnosis Date Noted  . NICM (nonischemic cardiomyopathy) (HCC) 01/16/2017  . 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 Acute combined systolic and diastolic heart failure (HCC)   . Elevated troponin   . CHF (congestive heart failure) (HCC) 01/02/2017  . 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 Diabetes (HCC) 01/02/2017    Past Surgical History:  Procedure Laterality Date  . left knee surgery    . RIGHT/LEFT HEART CATH AND CORONARY ANGIOGRAPHY N/A 01/05/2017   Procedure: Right/Left Heart Cath and Coronary Angiography;  Surgeon: [name], MD;  Location: [facility] INVASIVE CV LAB;  Service: Cardiovascular;  Laterality: N/A;    Current Outpatient Rx  . Order #: [id number] Class: No Print  . Order #: [id number] Class: Normal  . Order #: [id number] Class: Print  . Order #: [id number] Class: Normal  . Order #: [id number] Class: Normal  . Order #: [id number] Class: Normal    Allergies Patient has no known allergies.  Family  History  Problem Relation Age of Onset  . Multiple sclerosis Mother   . Diabetic kidney disease Mother   . Hypertension Mother     Social History Social History   Tobacco Use  . Smoking status: Never Smoker  . Smokeless tobacco: Never Used  Substance Use Topics  . Alcohol use: No  . Drug use: No    Review of Systems  Constitutional: No fever/chills Eyes: No visual changes. ENT: No sore throat. Cardiovascular: Denies chest pain. Mild LE edema.  Respiratory: Positive shortness of breath on exertion.  Gastrointestinal: No abdominal pain.  No nausea, no vomiting.  No diarrhea.  No constipation. Genitourinary: Negative for dysuria. Musculoskeletal: Negative for back pain. Skin: Negative for rash. Neurological: Negative for headaches, focal weakness or numbness.  10-point ROS otherwise negative.  ____________________________________________   PHYSICAL EXAM:  VITAL SIGNS: ED Triage Vitals [09/26/17 0224]  Enc Vitals Group     BP (!) 181/117     Pulse Rate (!) 118     Resp 20     Temp 98.5 F (36.9 C)     Temp Source Oral     SpO2 99 %     Weight (!) 370 lb (167.8 kg)     Height 6\' 1"  (1.854 m)   Constitutional: Alert and oriented. Well appearing and in no acute distress. Eyes: Conjunctivae are normal.  Head: Atraumatic. Nose: No congestion/rhinnorhea. Mouth/Throat: Mucous membranes are moist.  Neck: No stridor.   Cardiovascular: Sinus tachycardia. Good peripheral circulation. Grossly normal heart  sounds.   Respiratory: Normal respiratory effort.  No retractions. Lungs faint crackles at the bases. Gastrointestinal: Soft and nontender. No distention.  Musculoskeletal: No lower extremity tenderness with 1+ pitting edema bilaterally. No unilateral leg swelling. No gross deformities of extremities. Neurologic:  Normal speech and language. No gross focal neurologic deficits are appreciated.  Skin:  Skin is warm, dry and intact. No rash  noted.  ____________________________________________   LABS (all labs ordered are listed, but only abnormal results are displayed)  Labs Reviewed  COMPREHENSIVE METABOLIC PANEL - Abnormal; Notable for the following components:      Result Value   Glucose, Bld 146 (*)    Creatinine, Ser 1.44 (*)    GFR calc non Af Amer 59 (*)    All other components within normal limits  URINALYSIS, ROUTINE W REFLEX MICROSCOPIC - Abnormal; Notable for the following components:   Hgb urine dipstick MODERATE (*)    Ketones, ur 5 (*)    Protein, ur 100 (*)    Squamous Epithelial / LPF 0-5 (*)    All other components within normal limits  BRAIN NATRIURETIC PEPTIDE - Abnormal; Notable for the following components:   B Natriuretic Peptide 953.7 (*)    All other components within normal limits  CBC WITH DIFFERENTIAL/PLATELET  I-STAT TROPONIN, ED   ____________________________________________  EKG   EKG Interpretation  Date/Time:  Saturday September 26 2017 02:25:06 EST Ventricular Rate:  110 PR Interval:  150 QRS Duration: 90 QT Interval:  336 QTC Calculation: 454 R Axis:   94 Text Interpretation:  Sinus tachycardia Rightward axis Nonspecific ST and T wave abnormality Abnormal ECG No STEMI. Similar to prior tracings.  Confirmed by Alona Bene [phone]) on 09/26/2017 2:34:45 AM       ____________________________________________  RADIOLOGY  Dg Chest 2 View  Result Date: 09/26/2017 CLINICAL DATA:  Acute onset of shortness of breath and generalized chest tightness. Subacute onset of productive cough. EXAM: CHEST - 2 VIEW COMPARISON:  Chest radiograph performed 01/02/2017 FINDINGS: The lungs are well-aerated. Increased interstitial markings raise concern for mild interstitial edema. There is no evidence of pleural effusion or pneumothorax. The heart is borderline enlarged. No acute osseous abnormalities are seen. IMPRESSION: Increased interstitial markings raise concern for mild interstitial edema.  Borderline cardiomegaly. Electronically Signed   By: Roanna Raider M.D.   On: 09/26/2017 04:40    ____________________________________________   PROCEDURES  Procedure(s) performed:   Procedures  None ____________________________________________   INITIAL IMPRESSION / ASSESSMENT AND PLAN / ED COURSE  Pertinent labs & imaging results that were available during my care of the patient were reviewed by me and considered in my medical decision making (see chart for details).  Patient presents to the ED with mild exertional SOB with LE edema bilaterally. Has been off all meds recently including lasix. Labs and imaging reviewed. He has mild edema with no hypoxemia. No increased WOB here. No CP. Considered PE but diagnosis not well supported/suspected clinically. Plan for diuresis at home by re-starting meds and have the patient see the PCP on Monday for re-evaluation.   At this time, I do not feel there is any life-threatening condition present. I have reviewed and discussed all results (EKG, imaging, lab, urine as appropriate), exam findings with patient. I have reviewed nursing notes and appropriate previous records.  I feel the patient is safe to be discharged home without further emergent workup. Discussed usual and customary return precautions. Patient and family (if present) verbalize understanding and are comfortable with  this plan.  Patient will follow-up with their primary care provider. If they do not have a primary care provider, information for follow-up has been provided to them. All questions have been answered.  ____________________________________________  FINAL CLINICAL IMPRESSION(S) / ED DIAGNOSES  Final diagnoses:  Acute on chronic congestive heart failure, unspecified heart failure type (HCC)     MEDICATIONS GIVEN DURING THIS VISIT:  Medications  furosemide (LASIX) injection 40 mg (40 mg Intravenous Given 09/26/17 0504)     NEW OUTPATIENT MEDICATIONS STARTED DURING  THIS VISIT:  Lasix 20 mg daily x 5 days  Note:  This document was prepared using Dragon voice recognition software and may include unintentional dictation errors.  Alona Bene, MD Emergency Medicine    Long, [place] Repress, MD 09/26/17 [phone]

## 2017-09-26 NOTE — Discharge Instructions (Signed)
You were seen in the ED today with difficulty breathing. You have some increased fluid on exam and will require lasix for at least the next 5 days. You need to call your PCP on Monday to discuss if you should continue this or other medications Rodney Kane term and to have repeat lab tests done to evaluate your kidney function.   Return to the ED immediately with any chest pain, difficulty breathing, or other new/worsening symptoms.

## 2017-09-26 NOTE — ED Triage Notes (Signed)
Patient reports SOB /chest tightness with abdominal distention/edema and productive cough for several weeks .

## 2017-10-01 ENCOUNTER — Other Ambulatory Visit: Payer: Self-pay | Admitting: Cardiology

## 2017-10-07 ENCOUNTER — Encounter: Payer: Self-pay | Admitting: Family Medicine

## 2017-10-07 ENCOUNTER — Ambulatory Visit (INDEPENDENT_AMBULATORY_CARE_PROVIDER_SITE_OTHER): Payer: BLUE CROSS/BLUE SHIELD | Admitting: Family Medicine

## 2017-10-07 VITALS — BP 162/104 | HR 98 | Temp 97.9°F | Ht 73.0 in | Wt 366.0 lb

## 2017-10-07 DIAGNOSIS — E118 Type 2 diabetes mellitus with unspecified complications: Secondary | ICD-10-CM | POA: Diagnosis not present

## 2017-10-07 DIAGNOSIS — I10 Essential (primary) hypertension: Secondary | ICD-10-CM

## 2017-10-07 DIAGNOSIS — I272 Pulmonary hypertension, unspecified: Secondary | ICD-10-CM

## 2017-10-07 DIAGNOSIS — I50813 Acute on chronic right heart failure: Secondary | ICD-10-CM | POA: Diagnosis not present

## 2017-10-07 LAB — POCT URINALYSIS DIP (DEVICE)
Glucose, UA: NEGATIVE mg/dL
LEUKOCYTES UA: NEGATIVE
Nitrite: NEGATIVE
PH: 5.5 (ref 5.0–8.0)
Protein, ur: 100 mg/dL — AB
Specific Gravity, Urine: >=1.03 (ref 1.005–1.030)
Urobilinogen, UA: 0.2 mg/dL (ref 0.0–1.0)

## 2017-10-07 LAB — POCT GLYCOSYLATED HEMOGLOBIN (HGB A1C): Hemoglobin A1C: 8.1

## 2017-10-07 MED ORDER — HYDRALAZINE HCL 50 MG PO TABS: 50.0000 mg | ORAL_TABLET | Freq: Three times a day (TID) | ORAL | 2 refills | Status: DC

## 2017-10-07 MED ORDER — ASPIRIN 81 MG PO TBEC: 81.0000 mg | DELAYED_RELEASE_TABLET | Freq: Every day | ORAL | 0 refills | Status: DC

## 2017-10-07 MED ORDER — CLONIDINE HCL 0.1 MG PO TABS
0.1000 mg | ORAL_TABLET | Freq: Once | ORAL | Status: AC
  Administered 2017-10-07: 0.1 mg via ORAL

## 2017-10-07 MED ORDER — LOSARTAN POTASSIUM 25 MG PO TABS: 25.0000 mg | ORAL_TABLET | Freq: Every day | ORAL | 3 refills | Status: DC

## 2017-10-07 MED ORDER — POTASSIUM CHLORIDE CRYS ER 20 MEQ PO TBCR: 20.0000 meq | EXTENDED_RELEASE_TABLET | Freq: Every day | ORAL | 3 refills | Status: DC

## 2017-10-07 MED ORDER — METFORMIN HCL 500 MG PO TABS: 500.0000 mg | ORAL_TABLET | Freq: Two times a day (BID) | ORAL | 1 refills | Status: DC

## 2017-10-07 MED ORDER — FUROSEMIDE 20 MG PO TABS: 20.0000 mg | ORAL_TABLET | Freq: Every day | ORAL | 3 refills | Status: DC | PRN

## 2017-10-07 MED ORDER — CARVEDILOL 12.5 MG PO TABS: 12.5000 mg | ORAL_TABLET | Freq: Two times a day (BID) | ORAL | 2 refills | Status: DC

## 2017-10-07 NOTE — Progress Notes (Signed)
Patient ID: Rodney Kane, male    DOB: [birth year], 42 y.o.   MRN: [id number]  PCP: Rodney Neighbors, FNP  Chief Complaint  Patient presents with  . Follow-up    3 month  on diabetes and htn    Subjective:  HPI Rodney Kane is a 42 y.o. male with pulmonary hypertension, essential hypertension, morbid obesity, heart failure, diabetes, presents for evaluation diabetes and hypertension. Rodney Kane was last seen in clinic 01/29/2017 for a hospital follow after he was admitted for acute heart failure. During his last visit, he was treated for type 2 diabetes (A1C 7) and hypertension. He reports today that he has been out of medication for sometime. He never returned for his 3 month follow-up and has no showed last cardiology appointment. On 09/26/2017, he presented to the ED with worsening shortness of breath and was found to have BNP-953.7. He was administered IV lasix and discharged on oral Lasix 20 mg x 5 days. He reports improvement of shortness of breath, although continues to have residual increase work of breathing. He doesn't routinely monitor his blood pressure at home therefore uncertain of how high related have accelerated. Denies headaches, chest pain, or new weakness. He needs refills on all medications.  Social History   Socioeconomic History  . Marital status: Single    Spouse name: Not on file  . Number of children: Not on file  . Years of education: Not on file  . Highest education level: Not on file  Social Needs  . Financial resource strain: Not on file  . Food insecurity - worry: Not on file  . Food insecurity - inability: Not on file  . Transportation needs - medical: Not on file  . Transportation needs - non-medical: Not on file  Occupational History  . Not on file  Tobacco Use  . Smoking status: Never Smoker  . Smokeless tobacco: Never Used  Substance and Sexual Activity  . Alcohol use: No  . Drug use: No  . Sexual activity: Not on file  Other Topics Concern  .  Not on file  Social History Narrative  . Not on file    Family History  Problem Relation Age of Onset  . Multiple sclerosis Mother   . Diabetic kidney disease Mother   . Hypertension Mother    Review of Systems Pertinent negatives listed in HPI Patient Active Problem List   Diagnosis Date Noted  . NICM (nonischemic cardiomyopathy) (HCC) 01/16/2017  . 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 Acute combined systolic and diastolic heart failure (HCC)   . Elevated troponin   . CHF (congestive heart failure) (HCC) 01/02/2017  . 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 Diabetes (HCC) 01/02/2017    No Known Allergies  Prior to Admission medications   Medication Sig Start Date End Date Taking? Authorizing Provider  aspirin 81 MG EC tablet Take 1 tablet (81 mg total) by mouth daily. 01/29/17  Yes Rodney Neighbors, FNP  carvedilol (COREG) 12.5 MG tablet Take 1 tablet (12.5 mg total) by mouth 2 (two) times daily with a meal. Patient not taking: Reported on 10/07/2017 01/29/17   Rodney Neighbors, FNP  furosemide (LASIX) 20 MG tablet Take 1 tablet (20 mg total) by mouth daily for 5 days. 09/26/17 10/01/17  Long, [place] Repress, MD  hydrALAZINE (APRESOLINE) 50 MG tablet Take 1 tablet (50 mg total) by mouth 3 (three) times daily. Patient not taking: Reported on 10/07/2017 01/29/17   Rodney Neighbors, FNP  losartan (COZAAR) 25 MG tablet Take 1 tablet (25 mg total) by mouth daily. Patient not taking: Reported on 10/07/2017 01/29/17 09/27/23  Rodney Neighbors, FNP  metFORMIN (GLUCOPHAGE) 500 MG tablet Take 1 tablet (500 mg total) by mouth 2 (two) times daily with a meal. Patient not taking: Reported on 10/07/2017 01/29/17   Rodney Neighbors, FNP    Past Medical, Surgical Family and Social History reviewed and updated.    Objective:   Today's Vitals   10/07/17 1145 10/07/17 1208  BP: (!) 180/120 (!) 162/104  Pulse: 98   Temp: 97.9 F (36.6 C)   TempSrc: Oral   SpO2: 100%    Weight: (!) 366 lb (166 kg)   Height: 6\' 1"  (1.854 m)     Wt Readings from Last 3 Encounters:  10/07/17 (!) 366 lb (166 kg)  09/26/17 (!) 370 lb (167.8 kg)  01/29/17 (!) 361 lb (163.7 kg)    Physical Exam Constitutional: Patient appears well-developed and well-nourished. No distress. HENT: Normocephalic, atraumatic, External right and left ear normal. Oropharynx is clear and moist.  Eyes: Conjunctivae and EOM are normal. PERRLA, no scleral icterus. Neck: Normal ROM. Neck supple. No JVD. No tracheal deviation. No thyromegaly. CVS: RRR, S1/S2 +, no murmurs, no gallops, no carotid bruit.  Pulmonary: mild increase in effort and diminished breath sounds, no stridor, rhonchi, wheezes, rales.  Abdominal: Soft. BS +, mildly distension, tenderness, rebound or guarding.  Musculoskeletal: Normal range of motion. Trace edema present and no tenderness.  Skin: Skin is warm and dry. No rash noted. Not diaphoretic. No erythema. No pallor. Psychiatric: Normal mood and affect. Behavior, judgment, thought content normal.   Assessment & Plan:  1. Type 2 diabetes mellitus with complication, without long-term current use of insulin (HCC)- A1C-8.1. Resuming metformin 500 mg twice daily with meals. Will titrate if A1C doesn't improve.  2. Accelerated hypertension,  cloNIDine (CATAPRES) tablet 0.1 mg, now. Patient has not taken BP medications for several weeks. Resuming medications today. No changes in dose. We have discussed target BP range and blood pressure goal. I have advised patient to check BP regularly and to call us back or report to clinic if the numbers are consistently higher than 140/90. We discussed the importance of compliance with medical therapy and DASH diet recommended, consequences of uncontrolled hypertension discussed.  Continue current BP medications. Checking comprehensive metabolic panel.  3. Acute on chronic right-sided heart failure (HCC), checking on BNP. Will resume Furosamide 20 mg  daily with potassium 20 meq with each dose of lasix. Will titrate as needed to improve fluid retention. Referring patient back to cardiology for heart failure clinic.   Orders Placed This Encounter  Procedures  . Brain natriuretic peptide  . Comprehensive metabolic panel  . POCT urinalysis dip (device)   Meds ordered this encounter  Medications  . cloNIDine (CATAPRES) tablet 0.1 mg  . metFORMIN (GLUCOPHAGE) 500 MG tablet    Sig: Take 1 tablet (500 mg total) by mouth 2 (two) times daily with a meal.    Dispense:  180 tablet    Refill:  1    Order Specific Question:   Supervising Provider    Answer:   Quentin Angst [id number]  . losartan (COZAAR) 25 MG tablet    Sig: Take 1 tablet (25 mg total) by mouth daily.    Dispense:  90 tablet    Refill:  3    Order Specific Question:   Supervising Provider    Answer:  JEGEDE, OLUGBEMIGA E [id number]  . hydrALAZINE (APRESOLINE) 50 MG tablet    Sig: Take 1 tablet (50 mg total) by mouth 3 (three) times daily.    Dispense:  90 tablet    Refill:  2    Order Specific Question:   Supervising Provider    Answer:   Quentin Angst [id number]  . carvedilol (COREG) 12.5 MG tablet    Sig: Take 1 tablet (12.5 mg total) by mouth 2 (two) times daily with a meal.    Dispense:  60 tablet    Refill:  2    Order Specific Question:   Supervising Provider    Answer:   Quentin Angst [id number]  . aspirin 81 MG EC tablet    Sig: Take 1 tablet (81 mg total) by mouth daily.    Dispense:  30 tablet    Refill:  0    Order Specific Question:   Supervising Provider    Answer:   Quentin Angst [id number]  . DISCONTD: furosemide (LASIX) 20 MG tablet    Sig: Take 1 tablet (20 mg total) by mouth daily as needed.    Dispense:  30 tablet    Refill:  3    Order Specific Question:   Supervising Provider    Answer:   Quentin Angst [id number]  . potassium chloride SA (K-DUR,KLOR-CON) 20 MEQ tablet    Sig: Take 1 tablet (20 mEq total) by  mouth daily.    Dispense:  30 tablet    Refill:  3    Order Specific Question:   Supervising Provider    Answer:   Quentin Angst [[id number]]    RTC: BP check in 2 weeks and 3 months for chronic conditions  Godfrey Pick. Tiburcio Pea, MSN, FNP-C [facility]  [address] [place], [place] [address] [phone]

## 2017-10-08 LAB — COMPREHENSIVE METABOLIC PANEL
A/G RATIO: 1.4 (ref 1.2–2.2)
ALK PHOS: 67 IU/L (ref 39–117)
ALT: 21 IU/L (ref 0–44)
AST: 19 IU/L (ref 0–40)
Albumin: 4.3 g/dL (ref 3.5–5.5)
BILIRUBIN TOTAL: 0.5 mg/dL (ref 0.0–1.2)
BUN/Creatinine Ratio: 9 (ref 9–20)
BUN: 12 mg/dL (ref 6–24)
CALCIUM: 9.4 mg/dL (ref 8.7–10.2)
CHLORIDE: 101 mmol/L (ref 96–106)
CO2: 24 mmol/L (ref 20–29)
Creatinine, Ser: 1.33 mg/dL — ABNORMAL HIGH (ref 0.76–1.27)
GFR calc Af Amer: 76 mL/min/{1.73_m2} (ref >59)
GFR, EST NON AFRICAN AMERICAN: 65 mL/min/{1.73_m2} (ref >59)
GLOBULIN, TOTAL: 3.1 g/dL (ref 1.5–4.5)
Glucose: 143 mg/dL — ABNORMAL HIGH (ref 65–99)
POTASSIUM: 4 mmol/L (ref 3.5–5.2)
SODIUM: 142 mmol/L (ref 134–144)
Total Protein: 7.4 g/dL (ref 6.0–8.5)

## 2017-10-08 LAB — BRAIN NATRIURETIC PEPTIDE: BNP: 573.4 pg/mL — ABNORMAL HIGH (ref 0.0–100.0)

## 2017-10-09 ENCOUNTER — Telehealth: Payer: Self-pay | Admitting: Family Medicine

## 2017-10-09 MED ORDER — FUROSEMIDE 20 MG PO TABS: 40.0000 mg | ORAL_TABLET | Freq: Every day | ORAL | 3 refills | Status: DC

## 2017-10-09 NOTE — Telephone Encounter (Signed)
Left a vm for patient to callback 

## 2017-10-09 NOTE — Telephone Encounter (Signed)
Contact patient to advise that his recent labs indicate that he is still retaining fluid therefore I am placing him on continuous furosemide 40 mg once daily.  He will need to take potassium tablet once daily in order to prevent low potassium level.  It is also important that he resume his diabetes medications as this can have a negative impact on his kidneys.  His creatinine which is relevant to kidney function was slightly elevated above normal range.  This level will be rechecked within the next 3 months.  He he will need to keep his follow-up appointment on 10/21/2017 and at that time I will repeat labs.  He should also expect a call from cardiology to reestablish with their office for heart failure management.

## 2017-10-12 NOTE — Telephone Encounter (Signed)
Patient notified

## 2017-11-30 ENCOUNTER — Encounter: Payer: Self-pay | Admitting: Internal Medicine

## 2017-11-30 ENCOUNTER — Emergency Department (HOSPITAL_COMMUNITY)
Admission: EM | Admit: 2017-11-30 | Discharge: 2017-12-01 | Disposition: A | Discharge status: Home / Self Care | Attending: Emergency Medicine | Admitting: Emergency Medicine

## 2017-11-30 ENCOUNTER — Other Ambulatory Visit: Payer: Self-pay

## 2017-11-30 DIAGNOSIS — S01511A Laceration without foreign body of lip, initial encounter: Secondary | ICD-10-CM | POA: Diagnosis not present

## 2017-11-30 DIAGNOSIS — Y990 Civilian activity done for income or pay: Secondary | ICD-10-CM | POA: Diagnosis not present

## 2017-11-30 DIAGNOSIS — Z7982 Long term (current) use of aspirin: Secondary | ICD-10-CM | POA: Diagnosis not present

## 2017-11-30 DIAGNOSIS — I5041 Acute combined systolic (congestive) and diastolic (congestive) heart failure: Secondary | ICD-10-CM | POA: Insufficient documentation

## 2017-11-30 DIAGNOSIS — Z79899 Other long term (current) drug therapy: Secondary | ICD-10-CM | POA: Diagnosis not present

## 2017-11-30 DIAGNOSIS — Y939 Activity, unspecified: Secondary | ICD-10-CM | POA: Diagnosis not present

## 2017-11-30 DIAGNOSIS — Y929 Unspecified place or not applicable: Secondary | ICD-10-CM | POA: Insufficient documentation

## 2017-11-30 DIAGNOSIS — W228XXA Striking against or struck by other objects, initial encounter: Secondary | ICD-10-CM | POA: Insufficient documentation

## 2017-11-30 DIAGNOSIS — N182 Chronic kidney disease, stage 2 (mild): Secondary | ICD-10-CM | POA: Insufficient documentation

## 2017-11-30 DIAGNOSIS — E119 Type 2 diabetes mellitus without complications: Secondary | ICD-10-CM | POA: Diagnosis not present

## 2017-11-30 DIAGNOSIS — Z7984 Long term (current) use of oral hypoglycemic drugs: Secondary | ICD-10-CM | POA: Diagnosis not present

## 2017-11-30 DIAGNOSIS — S0181XA Laceration without foreign body of other part of head, initial encounter: Secondary | ICD-10-CM | POA: Diagnosis not present

## 2017-11-30 DIAGNOSIS — I130 Hypertensive heart and chronic kidney disease with heart failure and stage 1 through stage 4 chronic kidney disease, or unspecified chronic kidney disease: Secondary | ICD-10-CM | POA: Diagnosis not present

## 2017-11-30 DIAGNOSIS — Z23 Encounter for immunization: Secondary | ICD-10-CM | POA: Insufficient documentation

## 2017-11-30 MED ORDER — LIDOCAINE HCL (PF) 1 % IJ SOLN
2.0000 mL | Freq: Once | INTRAMUSCULAR | Status: AC
Start: 2017-11-30 — End: 2017-11-30
  Administered 2017-11-30: 2 mL
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
## 2017-11-30 NOTE — ED Provider Notes (Signed)
[facility] EMERGENCY DEPARTMENT Provider Note   CSN: [id number] Arrival date & time: 11/30/17  1953     History   Chief Complaint Chief Complaint  Patient presents with  . Facial Injury    HPI Rodney Kane is a 42 y.o. male.  Patient struck in the face with a steel beam while at work today. He suffered a laceration just below the lip, left of the midline, about 0.6 cm in length. He also has a puncture wound to the inner aspect of the lower lip from contact with his teeth. Teeth are intact, not displaced. Patient did not lose consciousness.  The history is provided by the patient. No language interpreter was used.  Facial Injury  Mechanism of injury:  Direct blow Location:  Chin Pain details:    Quality:  Aching   Severity:  Mild   Timing:  Constant   Progression:  Waxing and waning Foreign body present:  No foreign bodies Associated symptoms: no altered mental status and no malocclusion     Past Medical History:  Diagnosis Date  . Diabetes mellitus without complication (HCC)   . Hypertension     Patient Active Problem List   Diagnosis Date Noted  . NICM (nonischemic cardiomyopathy) (HCC) 01/16/2017  . 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 Acute combined systolic and diastolic heart failure (HCC)   . Elevated troponin   . CHF (congestive heart failure) (HCC) 01/02/2017  . 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 Diabetes (HCC) 01/02/2017    Past Surgical History:  Procedure Laterality Date  . left knee surgery    . RIGHT/LEFT HEART CATH AND CORONARY ANGIOGRAPHY N/A 01/05/2017   Procedure: Right/Left Heart Cath and Coronary Angiography;  Surgeon: [name], MD;  Location: [facility] INVASIVE CV LAB;  Service: Cardiovascular;  Laterality: N/A;        Home Medications    Prior to Admission medications   Medication Sig Start Date End Date Taking? Authorizing Provider  aspirin 81 MG EC tablet Take 1 tablet (81 mg total)  by mouth daily. 10/07/17   Bing Neighbors, FNP  carvedilol (COREG) 12.5 MG tablet Take 1 tablet (12.5 mg total) by mouth 2 (two) times daily with a meal. 10/07/17   Bing Neighbors, FNP  furosemide (LASIX) 20 MG tablet Take 2 tablets (40 mg total) by mouth daily. 10/09/17   Bing Neighbors, FNP  hydrALAZINE (APRESOLINE) 50 MG tablet Take 1 tablet (50 mg total) by mouth 3 (three) times daily. 10/07/17   Bing Neighbors, FNP  losartan (COZAAR) 25 MG tablet Take 1 tablet (25 mg total) by mouth daily. 10/07/17 01/05/18  Bing Neighbors, FNP  metFORMIN (GLUCOPHAGE) 500 MG tablet Take 1 tablet (500 mg total) by mouth 2 (two) times daily with a meal. 10/07/17   Bing Neighbors, FNP  potassium chloride SA (K-DUR,KLOR-CON) 20 MEQ tablet Take 1 tablet (20 mEq total) by mouth daily. 10/07/17   Bing Neighbors, FNP    Family History Family History  Problem Relation Age of Onset  . Multiple sclerosis Mother   . Diabetic kidney disease Mother   . Hypertension Mother     Social History Social History   Tobacco Use  . Smoking status: Never Smoker  . Smokeless tobacco: Never Used  Substance Use Topics  . Alcohol use: No  . Drug use: No     Allergies   Patient has no known allergies.   Review of Systems Review  of Systems  Skin: Positive for wound.  All other systems reviewed and are negative.    Physical Exam Updated Vital Signs BP (S) (!) 181/116 Comment: recheck  Pulse 92   Temp 99.4 F (37.4 C) (Oral)   Resp 16   Ht 6\' 1"  (1.854 m)   Wt (!) 163.3 kg (360 lb)   SpO2 97%   BMI 47.50 kg/m   Physical Exam  Constitutional: He is oriented to person, place, and time. He appears well-developed and well-nourished.  Eyes: Conjunctivae are normal.  Neck: Neck supple.  Cardiovascular: Normal rate and regular rhythm.  Pulmonary/Chest: Effort normal and breath sounds normal.  Abdominal: Soft.  Musculoskeletal: Normal range of motion.  Neurological: He is alert and  oriented to person, place, and time.  Skin: Skin is warm and dry.  Psychiatric: He has a normal mood and affect.  Nursing note and vitals reviewed.    ED Treatments / Results  Labs (all labs ordered are listed, but only abnormal results are displayed) Labs Reviewed - No data to display  EKG None  Radiology No results found.  Procedures .Marland KitchenLaceration Repair Date/Time: 11/30/2017 11:57 PM Performed by: Felicie Morn, NP Authorized by: Felicie Morn, NP   Consent:    Consent obtained:  Verbal   Consent given by:  Patient   Risks discussed:  Infection and poor cosmetic result   Alternatives discussed:  No treatment Anesthesia (see MAR for exact dosages):    Anesthesia method:  Local infiltration   Local anesthetic:  Lidocaine 1% w/o epi Laceration details:    Location:  Lip   Length (cm):  0.6 Repair type:    Repair type:  Simple Pre-procedure details:    Preparation:  Patient was prepped and draped in usual sterile fashion Exploration:    Hemostasis achieved with:  Direct pressure   Contaminated: no   Treatment:    Area cleansed with:  Betadine and saline   Amount of cleaning:  Standard   Irrigation solution:  Sterile saline   Irrigation method:  Syringe   Visualized foreign bodies/material removed: yes   Skin repair:    Repair method:  Sutures   Suture size:  6-0   Suture material:  Prolene   Suture technique:  Simple interrupted Approximation:    Approximation:  Close Post-procedure details:    Dressing:  Open (no dressing)   Patient tolerance of procedure:  Tolerated well, no immediate complications   (including critical care time)  Medications Ordered in ED Medications  lidocaine (PF) (XYLOCAINE) 1 % injection 2 mL (2 mLs Infiltration Given 11/30/17 2324)  Tdap (BOOSTRIX) injection 0.5 mL (0.5 mLs Intramuscular Given 12/01/17 0025)     Initial Impression / Assessment and Plan / ED Course  I have reviewed the triage vital signs and the nursing  notes.  Pertinent labs & imaging results that were available during my care of the patient were reviewed by me and considered in my medical decision making (see chart for details).    Patient noted to be hypertensive in the emergency department.  No signs of hypertensive urgency.  Discussed with patient the need for close follow-up and management by their primary care physician.    Tetanus updated in ED. Laceration occurred < 12 hours prior to repair. Discussed laceration care with pt and answered questions. Pt to f-u for suture removal in 7 days and wound check sooner should there be signs of dehiscence or infection. Pt is hemodynamically stable with no complaints prior to dc.  Final Clinical Impressions(s) / ED Diagnoses   Final diagnoses:  Facial laceration, initial encounter  Lip laceration, initial encounter    ED Discharge Orders    None       Felicie Morn, NP 12/01/17 [id number]    Geoffery Lyons, MD 12/02/17 [phone]

## 2017-12-01 MED ORDER — TETANUS-DIPHTH-ACELL PERTUSSIS 5-2.5-18.5 LF-MCG/0.5 IM SUSP
0.5000 mL | Freq: Once | INTRAMUSCULAR | Status: AC
  Administered 2017-12-01: 0.5 mL via INTRAMUSCULAR
  Filled 2017-12-01: qty 0.5

## 2017-12-01 NOTE — ED Notes (Signed)
Pt stated he has not taken his blood pressure medication today and knows his pressure is high.

## 2017-12-08 ENCOUNTER — Ambulatory Visit: Payer: BLUE CROSS/BLUE SHIELD | Admitting: Family Medicine

## 2017-12-21 ENCOUNTER — Encounter: Payer: Self-pay | Admitting: Internal Medicine

## 2017-12-21 ENCOUNTER — Ambulatory Visit: Payer: BLUE CROSS/BLUE SHIELD | Admitting: Internal Medicine

## 2018-01-13 ENCOUNTER — Telehealth: Payer: Self-pay | Admitting: *Deleted

## 2018-01-13 NOTE — Telephone Encounter (Signed)
Pt has referral in [facility]. Calling to sched New Patient appt with pt.

## 2018-06-03 IMAGING — CR DG CHEST 2V
2 series · 2 of 2 positions shown · non-contrast
Comparison: None.

CLINICAL DATA: 40-year-old male with shortness of breath for 2
weeks. Illness with cough. Chest pressure, pain for 1 week.

EXAM:
CHEST  2 VIEW

[w chest pa]
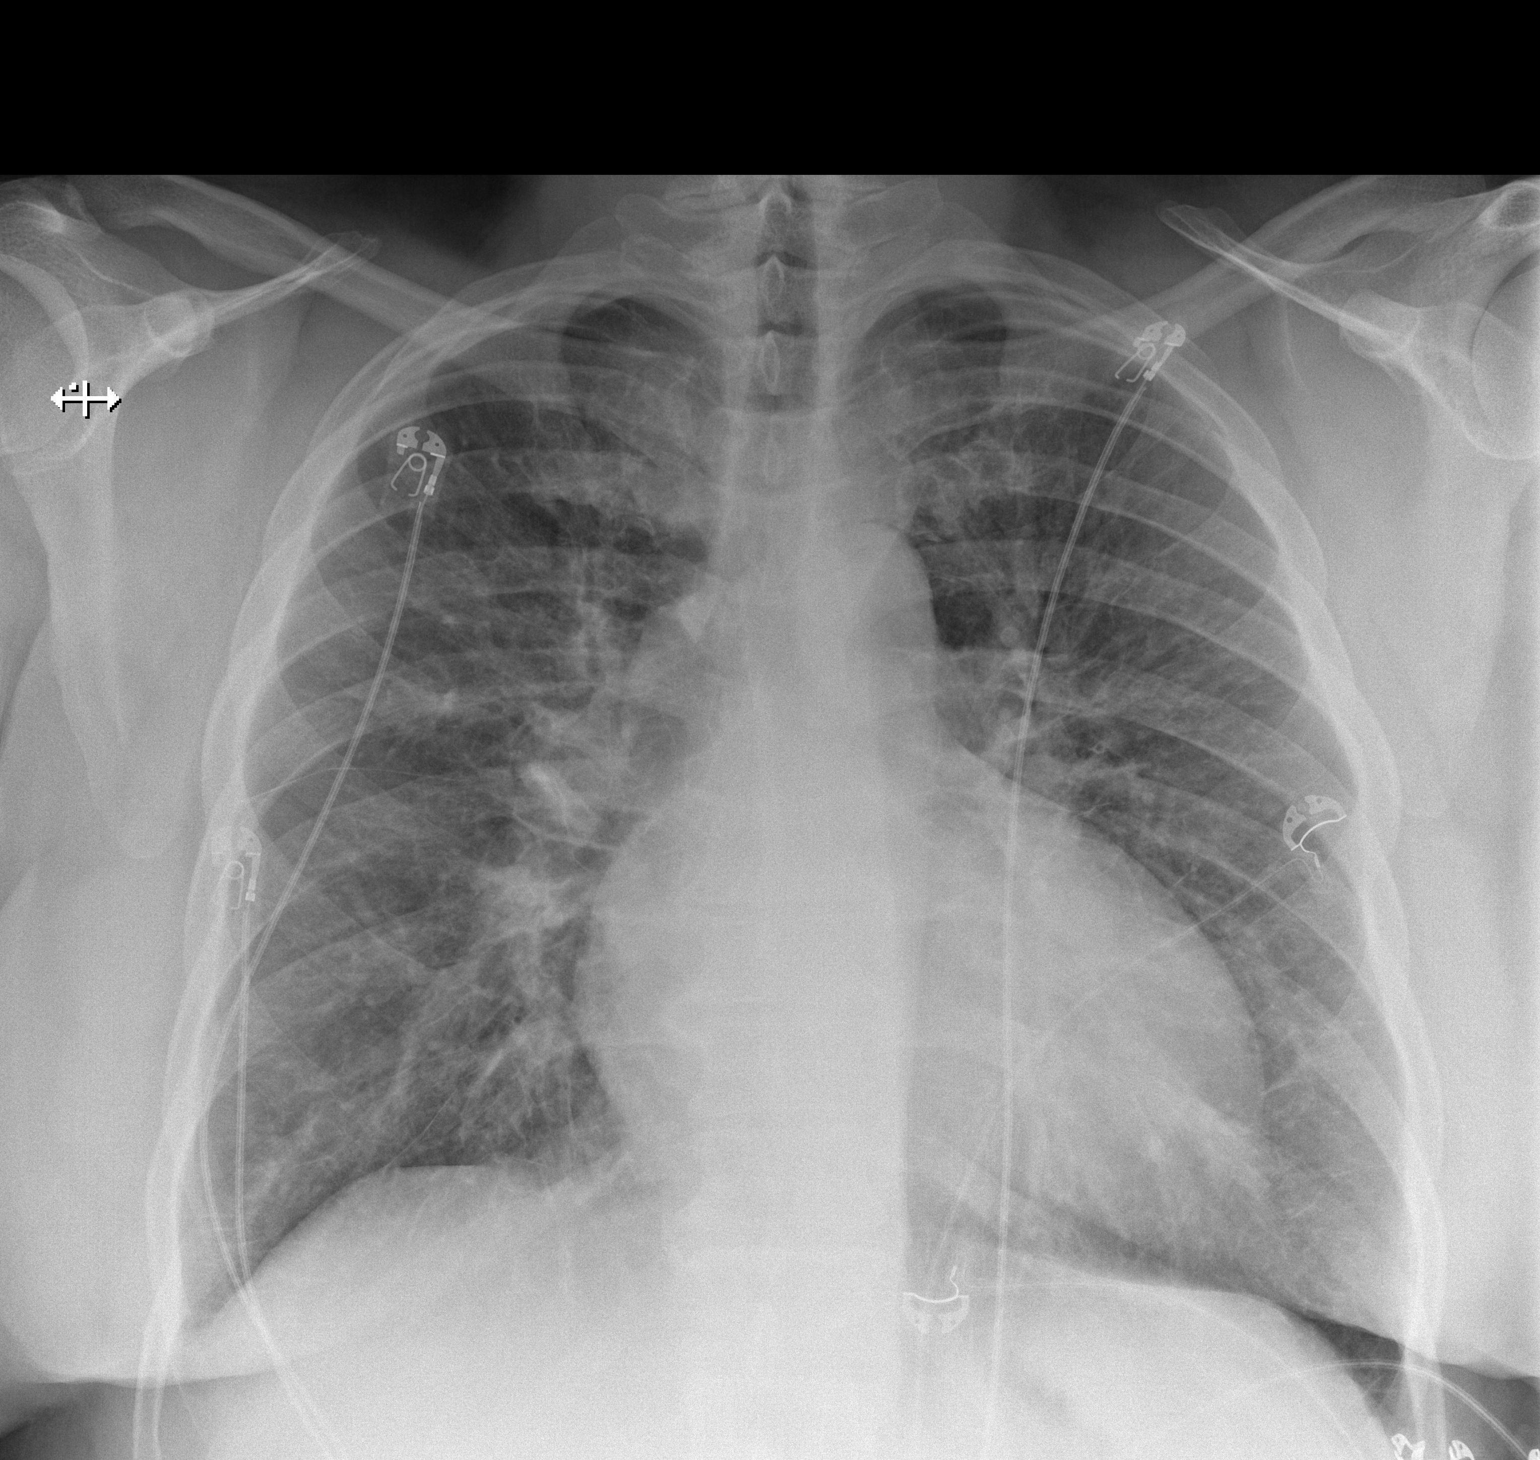

[w chest lat]
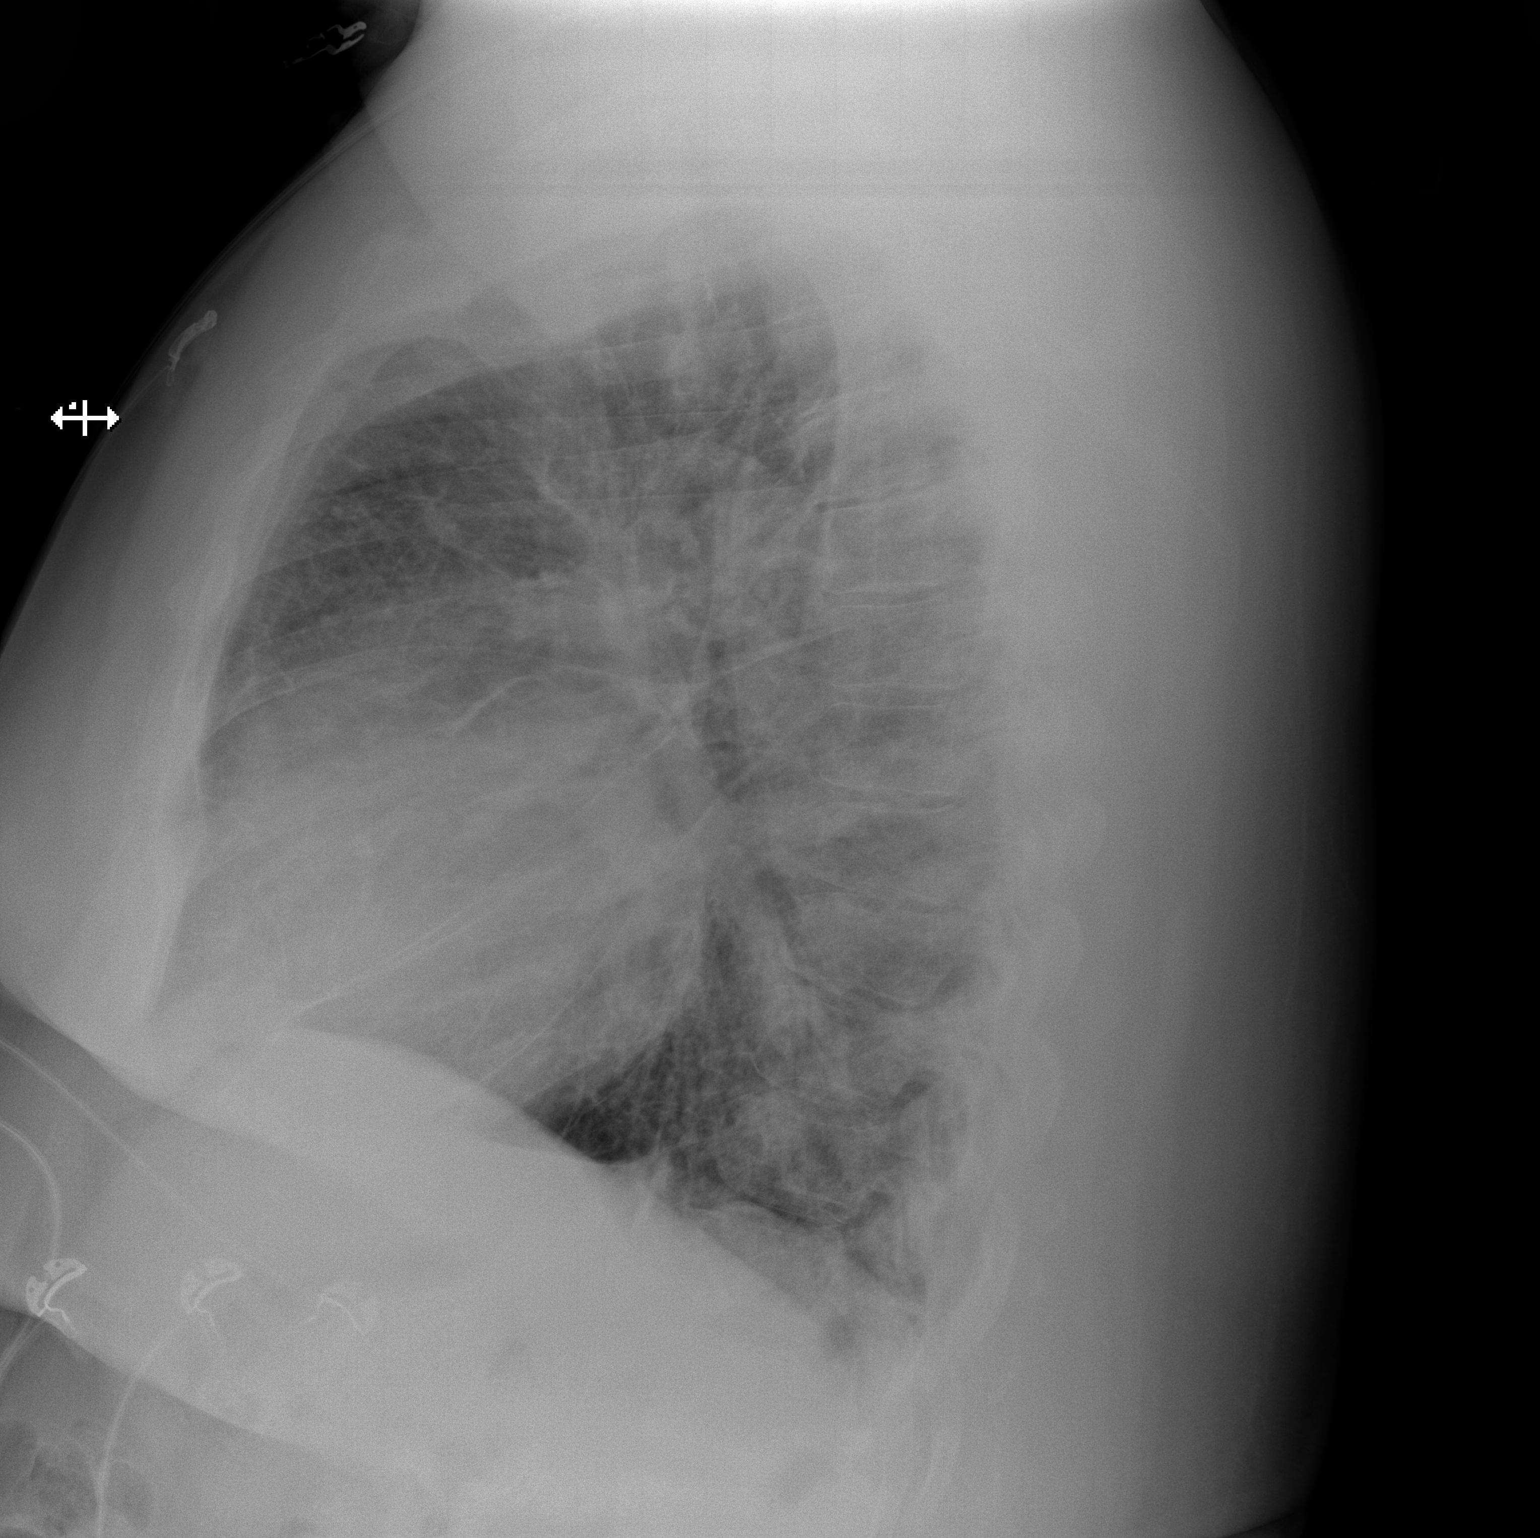

[2 of 2 positions shown; findings below may reference images not displayed]

FINDINGS: Cardiomegaly. Other mediastinal contours are within normal limits.
Visualized tracheal air column is within normal limits. Increased
pulmonary interstitial opacity. Possible trace pleural fluid in the
fissures. No pneumothorax or layering effusion. No confluent
pulmonary opacity. No acute osseous abnormality identified. Negative
visible bowel gas pattern.
IMPRESSION: Cardiomegaly, with pulmonary parenchymal appearance suggestive of
interstitial edema. Consider congestive heart failure.

## 2018-06-24 ENCOUNTER — Emergency Department (HOSPITAL_COMMUNITY)
Admission: EM | Admit: 2018-06-24 | Discharge: 2018-06-24 | Disposition: A | Discharge status: Home / Self Care | Payer: BLUE CROSS/BLUE SHIELD | Attending: Emergency Medicine | Admitting: Emergency Medicine

## 2018-06-24 ENCOUNTER — Other Ambulatory Visit: Payer: Self-pay

## 2018-06-24 ENCOUNTER — Encounter (HOSPITAL_COMMUNITY): Payer: Self-pay | Admitting: Emergency Medicine

## 2018-06-24 ENCOUNTER — Emergency Department (HOSPITAL_COMMUNITY): Payer: BLUE CROSS/BLUE SHIELD

## 2018-06-24 DIAGNOSIS — K429 Umbilical hernia without obstruction or gangrene: Secondary | ICD-10-CM | POA: Diagnosis not present

## 2018-06-24 DIAGNOSIS — N182 Chronic kidney disease, stage 2 (mild): Secondary | ICD-10-CM | POA: Diagnosis not present

## 2018-06-24 DIAGNOSIS — K5901 Slow transit constipation: Secondary | ICD-10-CM | POA: Insufficient documentation

## 2018-06-24 DIAGNOSIS — I130 Hypertensive heart and chronic kidney disease with heart failure and stage 1 through stage 4 chronic kidney disease, or unspecified chronic kidney disease: Secondary | ICD-10-CM | POA: Diagnosis not present

## 2018-06-24 DIAGNOSIS — I5042 Chronic combined systolic (congestive) and diastolic (congestive) heart failure: Secondary | ICD-10-CM | POA: Diagnosis not present

## 2018-06-24 DIAGNOSIS — K5900 Constipation, unspecified: Secondary | ICD-10-CM | POA: Diagnosis not present

## 2018-06-24 DIAGNOSIS — E1122 Type 2 diabetes mellitus with diabetic chronic kidney disease: Secondary | ICD-10-CM | POA: Diagnosis not present

## 2018-06-24 DIAGNOSIS — E1165 Type 2 diabetes mellitus with hyperglycemia: Secondary | ICD-10-CM | POA: Diagnosis not present

## 2018-06-24 DIAGNOSIS — K6289 Other specified diseases of anus and rectum: Secondary | ICD-10-CM

## 2018-06-24 DIAGNOSIS — R739 Hyperglycemia, unspecified: Secondary | ICD-10-CM

## 2018-06-24 LAB — COMPREHENSIVE METABOLIC PANEL
ALBUMIN: 3.9 g/dL (ref 3.5–5.0)
ALK PHOS: 142 U/L — AB (ref 38–126)
ALT: 31 U/L (ref 0–44)
AST: 24 U/L (ref 15–41)
Anion gap: 14 (ref 5–15)
BUN: 14 mg/dL (ref 6–20)
CALCIUM: 9.5 mg/dL (ref 8.9–10.3)
CHLORIDE: 92 mmol/L — AB (ref 98–111)
CO2: 22 mmol/L (ref 22–32)
CREATININE: 1.38 mg/dL — AB (ref 0.61–1.24)
GFR calc Af Amer: >60 mL/min (ref >60)
GFR calc non Af Amer: >60 mL/min (ref >60)
GLUCOSE: 814 mg/dL — AB (ref 70–99)
Potassium: 4.8 mmol/L (ref 3.5–5.1)
SODIUM: 128 mmol/L — AB (ref 135–145)
Total Bilirubin: 0.5 mg/dL (ref 0.3–1.2)
Total Protein: 7.7 g/dL (ref 6.5–8.1)

## 2018-06-24 LAB — CBG MONITORING, ED
Glucose-Capillary: 424 mg/dL — ABNORMAL HIGH (ref 70–99)
Glucose-Capillary: 305 mg/dL — ABNORMAL HIGH (ref 70–99)

## 2018-06-24 LAB — LIPASE, BLOOD: Lipase: 24 U/L (ref 11–51)

## 2018-06-24 LAB — CBC WITH DIFFERENTIAL/PLATELET
ABS IMMATURE GRANULOCYTES: 0.01 10*3/uL (ref 0.00–0.07)
BASOS ABS: 0.1 10*3/uL (ref 0.0–0.1)
BASOS PCT: 1 %
Eosinophils Absolute: 0.1 10*3/uL (ref 0.0–0.5)
Eosinophils Relative: 3 %
HCT: 45.3 % (ref 39.0–52.0)
HEMOGLOBIN: 14.3 g/dL (ref 13.0–17.0)
IMMATURE GRANULOCYTES: 0 %
LYMPHS PCT: 32 %
Lymphs Abs: 1.8 10*3/uL (ref 0.7–4.0)
MCH: 25.3 pg — ABNORMAL LOW (ref 26.0–34.0)
MCHC: 31.6 g/dL (ref 30.0–36.0)
MCV: 80 fL (ref 80.0–100.0)
MONO ABS: 0.5 10*3/uL (ref 0.1–1.0)
Monocytes Relative: 9 %
NEUTROS ABS: 3 10*3/uL (ref 1.7–7.7)
NEUTROS PCT: 55 %
NRBC: 0 % (ref 0.0–0.2)
PLATELETS: 224 10*3/uL (ref 150–400)
RBC: 5.66 MIL/uL (ref 4.22–5.81)
RDW: 12.5 % (ref 11.5–15.5)
WBC: 5.5 10*3/uL (ref 4.0–10.5)

## 2018-06-24 MED ORDER — INSULIN ASPART 100 UNIT/ML ~~LOC~~ SOLN
8.0000 [IU] | Freq: Once | SUBCUTANEOUS | Status: AC
  Administered 2018-06-24: 8 [IU] via INTRAVENOUS

## 2018-06-24 MED ORDER — METFORMIN HCL 500 MG PO TABS: 500.0000 mg | ORAL_TABLET | Freq: Two times a day (BID) | ORAL | 0 refills | Status: DC

## 2018-06-24 MED ORDER — ONDANSETRON HCL 4 MG/2ML IJ SOLN
4.0000 mg | Freq: Once | INTRAMUSCULAR | Status: AC
  Administered 2018-06-24: 4 mg via INTRAVENOUS
  Filled 2018-06-24: qty 2

## 2018-06-24 MED ORDER — CARVEDILOL 12.5 MG PO TABS
12.5000 mg | ORAL_TABLET | Freq: Once | ORAL | Status: AC
  Administered 2018-06-24: 12.5 mg via ORAL
  Filled 2018-06-24 (×2): qty 1

## 2018-06-24 MED ORDER — SODIUM CHLORIDE 0.9 % IV BOLUS
1000.0000 mL | Freq: Once | INTRAVENOUS | Status: AC
Start: 2018-06-24 — End: 2018-06-24
  Administered 2018-06-24: 1000 mL via INTRAVENOUS

## 2018-06-24 MED ORDER — INSULIN ASPART 100 UNIT/ML ~~LOC~~ SOLN
10.0000 [IU] | Freq: Once | SUBCUTANEOUS | Status: AC
  Administered 2018-06-24: 10 [IU] via INTRAVENOUS

## 2018-06-24 MED ORDER — INSULIN ASPART 100 UNIT/ML ~~LOC~~ SOLN: 10.0000 [IU] | Freq: Once | SUBCUTANEOUS | Status: DC

## 2018-06-24 MED ORDER — DOCUSATE SODIUM 100 MG PO CAPS: 100.0000 mg | ORAL_CAPSULE | Freq: Two times a day (BID) | ORAL | 0 refills | Status: AC

## 2018-06-24 MED ORDER — HYDROMORPHONE HCL 1 MG/ML IJ SOLN
1.0000 mg | Freq: Once | INTRAMUSCULAR | Status: AC
  Administered 2018-06-24: 1 mg via INTRAVENOUS
  Filled 2018-06-24: qty 1

## 2018-06-24 MED ORDER — IOPAMIDOL (ISOVUE-300) INJECTION 61%
100.0000 mL | Freq: Once | INTRAVENOUS | Status: AC | PRN
  Administered 2018-06-24: 100 mL via INTRAVENOUS

## 2018-06-24 NOTE — ED Triage Notes (Signed)
Pt in from home with c/o constipation since Tuesday. States tonight, he took vaseline and tried to remove stool manually - since then, he is having rectal pain. Denies any n/v, abd pain, bleeding or hemorrhoids. C/o severe indigestion from earlier today.

## 2018-06-24 NOTE — ED Provider Notes (Addendum)
[facility] EMERGENCY DEPARTMENT Provider Note   CSN: [id number] Arrival date & time: 06/24/18  2536     History   Chief Complaint Chief Complaint  Patient presents with  . Constipation  . Rectal Pain    HPI Rodney Kane is a 42 y.o. male.  HPI   42 year old male with past medical history of diabetes, CKD, CHF, severe hypertension, here with abdominal and rectal pain.  The patient states his symptoms started approximately 1 week ago.  He states that on Thanksgiving, he developed profuse diarrhea that was difficult to control.  The next day, he stopped having bowel movements and has not had one since then.  He is been complaining of intermittent indigestion, for which she has been taking over-the-counter Tums and Pepto-Bismol.  He states that throughout the day today, he has felt pressure in his rectal area with the sensation that he needs to go to the bathroom.  He is tried to use the restroom, and feels like he feels pressure there, but then is unable to pass any kind of bowel movement. He even attempted to manually promote a BM without success just prior to arrival. His pain has increased since then. No fever or chills.  Past Medical History:  Diagnosis Date  . Diabetes mellitus without complication (HCC)   . Hypertension     Patient Active Problem List   Diagnosis Date Noted  . NICM (nonischemic cardiomyopathy) (HCC) 01/16/2017  . 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 Acute combined systolic and diastolic heart failure (HCC)   . Elevated troponin   . CHF (congestive heart failure) (HCC) 01/02/2017  . 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 Diabetes (HCC) 01/02/2017    Past Surgical History:  Procedure Laterality Date  . left knee surgery    . RIGHT/LEFT HEART CATH AND CORONARY ANGIOGRAPHY N/A 01/05/2017   Procedure: Right/Left Heart Cath and Coronary Angiography;  Surgeon: [name], MD;  Location: [facility] INVASIVE CV LAB;   Service: Cardiovascular;  Laterality: N/A;        Home Medications    Prior to Admission medications   Medication Sig Start Date End Date Taking? Authorizing Provider  hydrALAZINE (APRESOLINE) 50 MG tablet Take 1 tablet (50 mg total) by mouth 3 (three) times daily. Patient taking differently: Take 50 mg by mouth daily as needed (hypertension).  10/07/17  Yes Bing Neighbors, FNP  aspirin 81 MG EC tablet Take 1 tablet (81 mg total) by mouth daily. Patient not taking: Reported on 06/24/2018 10/07/17   Bing Neighbors, FNP  carvedilol (COREG) 12.5 MG tablet Take 1 tablet (12.5 mg total) by mouth 2 (two) times daily with a meal. Patient not taking: Reported on 06/24/2018 10/07/17   Bing Neighbors, FNP  docusate sodium (COLACE) 100 MG capsule Take 1 capsule (100 mg total) by mouth every 12 (twelve) hours for 10 days. 06/24/18 07/04/18  Shaune Pollack, MD  furosemide (LASIX) 20 MG tablet Take 2 tablets (40 mg total) by mouth daily. Patient not taking: Reported on 06/24/2018 10/09/17   Bing Neighbors, FNP  losartan (COZAAR) 25 MG tablet Take 1 tablet (25 mg total) by mouth daily. Patient not taking: Reported on 06/24/2018 10/07/17 01/05/18  Bing Neighbors, FNP  metFORMIN (GLUCOPHAGE) 500 MG tablet Take 1 tablet (500 mg total) by mouth 2 (two) times daily with a meal. 06/24/18 07/24/18  Shaune Pollack, MD  potassium chloride SA (K-DUR,KLOR-CON) 20 MEQ tablet Take 1 tablet (20 mEq total)  by mouth daily. Patient not taking: Reported on 06/24/2018 10/07/17   Bing Neighbors, FNP    Family History Family History  Problem Relation Age of Onset  . Multiple sclerosis Mother   . Diabetic kidney disease Mother   . Hypertension Mother     Social History Social History   Tobacco Use  . Smoking status: Never Smoker  . Smokeless tobacco: Never Used  Substance Use Topics  . Alcohol use: No  . Drug use: No     Allergies   Patient has no known allergies.   Review of Systems Review  of Systems  Constitutional: Positive for fatigue. Negative for chills and fever.  HENT: Negative for congestion and rhinorrhea.   Eyes: Negative for visual disturbance.  Respiratory: Negative for cough, shortness of breath and wheezing.   Cardiovascular: Negative for chest pain and leg swelling.  Gastrointestinal: Positive for abdominal pain and constipation. Negative for diarrhea, nausea and vomiting.  Genitourinary: Negative for dysuria and flank pain.  Musculoskeletal: Negative for neck pain and neck stiffness.  Skin: Negative for rash and wound.  Allergic/Immunologic: Negative for immunocompromised state.  Neurological: Negative for syncope, weakness and headaches.  All other systems reviewed and are negative.    Physical Exam Updated Vital Signs BP (!) 138/101   Pulse 92   Temp 98.8 F (37.1 C) (Oral)   Resp 17   Wt (!) 163.3 kg   SpO2 95%   BMI 47.50 kg/m   Physical Exam  Constitutional: He is oriented to person, place, and time. He appears well-developed and well-nourished. No distress.  HENT:  Head: Normocephalic and atraumatic.  Dry MM  Eyes: Conjunctivae are normal.  Neck: Neck supple.  Cardiovascular: Normal rate, regular rhythm and normal heart sounds. Exam reveals no friction rub.  No murmur heard. Pulmonary/Chest: Effort normal and breath sounds normal. No respiratory distress. He has no wheezes. He has no rales.  Abdominal: Soft. Bowel sounds are normal. He exhibits no distension. There is tenderness (mild, generalized).  Musculoskeletal: He exhibits no edema.  Neurological: He is alert and oriented to person, place, and time. He exhibits normal muscle tone.  Skin: Skin is warm. Capillary refill takes less than 2 seconds.  Psychiatric: He has a normal mood and affect.  Nursing note and vitals reviewed.    ED Treatments / Results  Labs (all labs ordered are listed, but only abnormal results are displayed) Labs Reviewed  CBC WITH DIFFERENTIAL/PLATELET -  Abnormal; Notable for the following components:      Result Value   MCH 25.3 (*)    All other components within normal limits  COMPREHENSIVE METABOLIC PANEL - Abnormal; Notable for the following components:   Sodium 128 (*)    Chloride 92 (*)    Glucose, Bld 814 (*)    Creatinine, Ser 1.38 (*)    Alkaline Phosphatase 142 (*)    All other components within normal limits  CBG MONITORING, ED - Abnormal; Notable for the following components:   Glucose-Capillary 424 (*)    All other components within normal limits  CBG MONITORING, ED - Abnormal; Notable for the following components:   Glucose-Capillary 305 (*)    All other components within normal limits  LIPASE, BLOOD    EKG None  Radiology Ct Abdomen Pelvis W Contrast  Result Date: 06/24/2018 CLINICAL DATA:  42 year old male with a history constipation EXAM: CT ABDOMEN AND PELVIS WITH CONTRAST TECHNIQUE: Multidetector CT imaging of the abdomen and pelvis was performed using the standard  protocol following bolus administration of intravenous contrast. CONTRAST:  ISOVUE-300 IOPAMIDOL (ISOVUE-300) INJECTION 61% COMPARISON:  None. FINDINGS: Lower chest: No acute abnormality. Hepatobiliary: Cranial caudal span of the right liver measures greater than 20 cm. Otherwise unremarkable liver. Contracted gallbladder. Pancreas: Unremarkable Spleen: Unremarkable Adrenals/Urinary Tract: Unremarkable adrenal glands. Right kidney with no hydronephrosis or nephrolithiasis. Low-density cystic lesion in the medial cortex, incompletely characterized measuring less than 2 cm. Left kidney unremarkable with no hydronephrosis or nephrolithiasis. Unremarkable course the bilateral ureters. Unremarkable urinary bladder. Stomach/Bowel: Unremarkable stomach. Unremarkable small bowel. No abnormal distention. No transition point. Normal appendix. Mild stool burden. No transition point or dilated colon. No significant diverticular disease. No inflammatory changes or  focal wall thickening. Vascular/Lymphatic: No vascular calcifications. No aneurysm. No adenopathy Reproductive: Unremarkable pelvic structures. Other: Small fat containing umbilical hernia Musculoskeletal: No acute displaced fracture. Mild degenerative changes. IMPRESSION: No acute CT finding. No evidence of bowel obstruction, with moderate stool burden. Electronically Signed   By: Gilmer Mor D.O.   On: 06/24/2018 07:31    Procedures .Critical Care Performed by: Shaune Pollack, MD Authorized by: Shaune Pollack, MD   Critical care provider statement:    Critical care time (minutes):  35   Critical care time was exclusive of:  Separately billable procedures and treating other patients and teaching time   Critical care was necessary to treat or prevent imminent or life-threatening deterioration of the following conditions:  Cardiac failure, circulatory failure and metabolic crisis   Critical care was time spent personally by me on the following activities:  Development of treatment plan with patient or surrogate, discussions with consultants, evaluation of patient's response to treatment, examination of patient, obtaining history from patient or surrogate, ordering and performing treatments and interventions, ordering and review of laboratory studies, ordering and review of radiographic studies, pulse oximetry, re-evaluation of patient's condition and review of old charts   I assumed direction of critical care for this patient from another provider in my specialty: no     (including critical care time)  Medications Ordered in ED Medications  HYDROmorphone (DILAUDID) injection 1 mg (1 mg Intravenous Given 06/24/18 0441)  ondansetron (ZOFRAN) injection 4 mg (4 mg Intravenous Given 06/24/18 0441)  sodium chloride 0.9 % bolus 1,000 mL (0 mLs Intravenous Stopped 06/24/18 0735)  insulin aspart (novoLOG) injection 10 Units (10 Units Intravenous Given 06/24/18 0622)  iopamidol (ISOVUE-300) 61 %  injection 100 mL (100 mLs Intravenous Contrast Given 06/24/18 0705)  carvedilol (COREG) tablet 12.5 mg (12.5 mg Oral Given 06/24/18 0809)  insulin aspart (novoLOG) injection 8 Units (8 Units Intravenous Given 06/24/18 0853)     Initial Impression / Assessment and Plan / ED Course  I have reviewed the triage vital signs and the nursing notes.  Pertinent labs & imaging results that were available during my care of the patient were reviewed by me and considered in my medical decision making (see chart for details).     42 year old male here with constipation, general fatigue.  Lab work and imaging reviewed as above.  Patient has significant hyperglycemia, which I suspect is contributing to his weakness as well as possible diabetic diarrhea.  CT scan shows moderate stool burden but no acute emergent pathology.  White count normal.  Lab work otherwise reassuring. No signs of DKA or HHS clinically.  Patient given fluids cautiously given his history of CHF, and insulin.  Current plan to monitor sugars, if improved can likely discharge.  He was also given a dose of his  Coreg here.  Final Clinical Impressions(s) / ED Diagnoses   Final diagnoses:  Rectal pain  Hyperglycemia  Slow transit constipation    ED Discharge Orders         Ordered    metFORMIN (GLUCOPHAGE) 500 MG tablet  2 times daily with meals     06/24/18 0803    docusate sodium (COLACE) 100 MG capsule  Every 12 hours     06/24/18 0803           Shaune Pollack, MD 06/24/18 Lessie Dings, MD 07/06/18 [phone]

## 2018-06-24 NOTE — ED Provider Notes (Signed)
Care assumed from Dr. Erma Heritage at shift change.  Patient presented here with complaints of abdominal and rectal pain.  His work-up revealed an elevated sugar of over 800.  He has been on diabetic medications in the past, however is not currently taking anything.  There is no evidence for DKA.  He was hydrated with normal saline and sugars are now improving.  He also received insulin.  Care signed out to me awaiting results of a repeat CBG.  This was obtained and is now 305.  Patient is feeling much better and at this point I believe he is appropriate for discharge.  He does have a primary doctor to follow-up with.  I have advised him to make this appointment as soon as possible.  In the meantime he will be prescribed metformin.   Geoffery Lyons, MD 06/24/18 1006

## 2018-06-24 NOTE — Discharge Instructions (Signed)
For your blood sugar: - Follow-up with your doctor this week - I have prescribed metformin for the next month, but make sure you follow-up with your doctor - Eat a diabetic diet low in carbs and sugars  For your constipation: - Take Colace as prescribed to help soften your stool - I recommend over-the-counter Miralax once or twice daily until you have liquid stools, then stop - be sure to drink plenty of fluids with this to prevent dehydration

## 2018-06-24 NOTE — ED Notes (Signed)
Pt reports having large BM, semi-loose/hard. Still endorses rectal pain

## 2018-06-29 ENCOUNTER — Ambulatory Visit (HOSPITAL_COMMUNITY)
Admission: RE | Admit: 2018-06-29 | Discharge: 2018-06-29 | Disposition: A | Discharge status: Home / Self Care | Payer: BLUE CROSS/BLUE SHIELD | Source: Ambulatory Visit | Attending: Family Medicine | Admitting: Family Medicine

## 2018-06-29 ENCOUNTER — Encounter: Payer: Self-pay | Admitting: Family Medicine

## 2018-06-29 ENCOUNTER — Ambulatory Visit (INDEPENDENT_AMBULATORY_CARE_PROVIDER_SITE_OTHER): Payer: BLUE CROSS/BLUE SHIELD | Admitting: Family Medicine

## 2018-06-29 VITALS — BP 160/100 | HR 88 | Temp 97.9°F | Ht 73.0 in | Wt 351.0 lb

## 2018-06-29 DIAGNOSIS — E119 Type 2 diabetes mellitus without complications: Secondary | ICD-10-CM

## 2018-06-29 DIAGNOSIS — I10 Essential (primary) hypertension: Secondary | ICD-10-CM | POA: Diagnosis not present

## 2018-06-29 DIAGNOSIS — R7309 Other abnormal glucose: Secondary | ICD-10-CM | POA: Diagnosis not present

## 2018-06-29 DIAGNOSIS — R739 Hyperglycemia, unspecified: Secondary | ICD-10-CM | POA: Insufficient documentation

## 2018-06-29 DIAGNOSIS — E118 Type 2 diabetes mellitus with unspecified complications: Secondary | ICD-10-CM | POA: Diagnosis not present

## 2018-06-29 DIAGNOSIS — Z0000 Encounter for general adult medical examination without abnormal findings: Secondary | ICD-10-CM | POA: Diagnosis not present

## 2018-06-29 DIAGNOSIS — K219 Gastro-esophageal reflux disease without esophagitis: Secondary | ICD-10-CM | POA: Diagnosis not present

## 2018-06-29 DIAGNOSIS — Z09 Encounter for follow-up examination after completed treatment for conditions other than malignant neoplasm: Secondary | ICD-10-CM

## 2018-06-29 LAB — POCT URINALYSIS DIP (MANUAL ENTRY)
Bilirubin, UA: NEGATIVE
Blood, UA: NEGATIVE
Glucose, UA: 500 mg/dL — AB
Leukocytes, UA: NEGATIVE
Nitrite, UA: NEGATIVE
Protein Ur, POC: NEGATIVE mg/dL
Spec Grav, UA: <=1.005 — AB (ref 1.010–1.025)
Urobilinogen, UA: 0.2 E.U./dL
pH, UA: 5 (ref 5.0–8.0)

## 2018-06-29 LAB — GLUCOSE, POCT (MANUAL RESULT ENTRY)
POC Glucose: 583 mg/dl — AB (ref 70–99)
POC Glucose: 595 mg/dl — AB (ref 70–99)
POC Glucose: 599 mg/dl — AB (ref 70–99)

## 2018-06-29 MED ORDER — FUROSEMIDE 20 MG PO TABS: 40.0000 mg | ORAL_TABLET | Freq: Every day | ORAL | 3 refills | Status: DC

## 2018-06-29 MED ORDER — INSULIN LISPRO 100 UNIT/ML ~~LOC~~ SOLN
20.0000 [IU] | Freq: Once | SUBCUTANEOUS | Status: AC
  Administered 2018-06-29: 20 [IU] via SUBCUTANEOUS

## 2018-06-29 MED ORDER — OMEPRAZOLE 40 MG PO CPDR: 40.0000 mg | DELAYED_RELEASE_CAPSULE | Freq: Every day | ORAL | 3 refills | Status: DC

## 2018-06-29 MED ORDER — SODIUM CHLORIDE 0.9 % IV SOLN
INTRAVENOUS | Status: DC
  Administered 2018-06-29: 11:00:00 via INTRAVENOUS

## 2018-06-29 MED ORDER — METFORMIN HCL 1000 MG PO TABS: 1000.0000 mg | ORAL_TABLET | Freq: Two times a day (BID) | ORAL | 3 refills | Status: DC

## 2018-06-29 MED ORDER — INSULIN ASPART 100 UNIT/ML ~~LOC~~ SOLN: 15.0000 [IU] | Freq: Three times a day (TID) | SUBCUTANEOUS | 11 refills | Status: DC

## 2018-06-29 MED ORDER — GLIPIZIDE 10 MG PO TABS: 10.0000 mg | ORAL_TABLET | Freq: Two times a day (BID) | ORAL | 3 refills | Status: DC

## 2018-06-29 MED ORDER — LOSARTAN POTASSIUM 25 MG PO TABS: 25.0000 mg | ORAL_TABLET | Freq: Every day | ORAL | 3 refills | Status: DC

## 2018-06-29 MED ORDER — BLOOD GLUCOSE METER KIT: PACK | 0 refills | Status: DC

## 2018-06-29 MED ORDER — CARVEDILOL 12.5 MG PO TABS: 12.5000 mg | ORAL_TABLET | Freq: Two times a day (BID) | ORAL | 3 refills | Status: DC

## 2018-06-29 MED ORDER — HYDRALAZINE HCL 50 MG PO TABS: 50.0000 mg | ORAL_TABLET | Freq: Three times a day (TID) | ORAL | 3 refills | Status: DC

## 2018-06-29 MED ORDER — INSULIN GLARGINE 100 UNITS/ML SOLOSTAR PEN: 35.0000 [IU] | PEN_INJECTOR | Freq: Every day | SUBCUTANEOUS | 3 refills | Status: DC

## 2018-06-29 MED ORDER — CLONIDINE HCL 0.1 MG PO TABS
0.2000 mg | ORAL_TABLET | Freq: Once | ORAL | Status: AC
  Administered 2018-06-29: 0.2 mg via ORAL

## 2018-06-29 NOTE — Patient Instructions (Addendum)
Metformin tablets What is this medicine? METFORMIN (met FOR min) is used to treat type 2 diabetes. It helps to control blood sugar. Treatment is combined with diet and exercise. This medicine can be used alone or with other medicines for diabetes. This medicine may be used for other purposes; ask your health care provider or pharmacist if you have questions. COMMON BRAND NAME(S): Glucophage What should I tell my health care provider before I take this medicine? They need to know if you have any of these conditions: -anemia -dehydration -heart disease -frequently drink alcohol-containing beverages -kidney disease -liver disease -polycystic ovary syndrome -serious infection or injury -vomiting -an unusual or allergic reaction to metformin, other medicines, foods, dyes, or preservatives -pregnant or trying to get pregnant -breast-feeding How should I use this medicine? Take this medicine by mouth with a glass of water. Follow the directions on the prescription label. Take this medicine with food. Take your medicine at regular intervals. Do not take your medicine more often than directed. Do not stop taking except on your doctor's advice. Talk to your pediatrician regarding the use of this medicine in children. While this drug may be prescribed for children as young as 41 years of age for selected conditions, precautions do apply. Overdosage: If you think you have taken too much of this medicine contact a poison control center or emergency room at once. NOTE: This medicine is only for you. Do not share this medicine with others. What if I miss a dose? If you miss a dose, take it as soon as you can. If it is almost time for your next dose, take only that dose. Do not take double or extra doses. What may interact with this medicine? Do not take this medicine with any of the following medications: -dofetilide -certain contrast medicines given before X-rays, CT scans, MRI, or other  procedures This medicine may also interact with the following medications: -acetazolamide -certain antiviral medicines for HIV or AIDS or for hepatitis, like adefovir, dolutegravir, emtricitabine, entecavir, lamivudine, paritaprevir, or tenofovir -cimetidine -cobicistat -crizotinib -dichlorphenamide -digoxin -diuretics -male hormones, like estrogens or progestins and birth control pills -glycopyrrolate -isoniazid -lamotrigine -medicines for blood pressure, heart disease, irregular heart beat -memantine -midodrine -methazolamide -morphine -niacin -phenothiazines like chlorpromazine, mesoridazine, prochlorperazine, thioridazine -phenytoin -procainamide -propantheline -quinidine -quinine -ranitidine -ranolazine -steroid medicines like prednisone or cortisone -stimulant medicines for attention disorders, weight loss, or to stay awake -thyroid medicines -topiramate -trimethoprim -trospium -vancomycin -vandetanib -zonisamide This list may not describe all possible interactions. Give your health care provider a list of all the medicines, herbs, non-prescription drugs, or dietary supplements you use. Also tell them if you smoke, drink alcohol, or use illegal drugs. Some items may interact with your medicine. What should I watch for while using this medicine? Visit your doctor or health care professional for regular checks on your progress. A test called the HbA1C (A1C) will be monitored. This is a simple blood test. It measures your blood sugar control over the last 2 to 3 months. You will receive this test every 3 to 6 months. Learn how to check your blood sugar. Learn the symptoms of low and high blood sugar and how to manage them. Always carry a quick-source of sugar with you in case you have symptoms of low blood sugar. Examples include hard sugar candy or glucose tablets. Make sure others know that you can choke if you eat or drink when you develop serious symptoms of low  blood sugar, such as  seizures or unconsciousness. They must get medical help at once. Tell your doctor or health care professional if you have high blood sugar. You might need to change the dose of your medicine. If you are sick or exercising more than usual, you might need to change the dose of your medicine. Do not skip meals. Ask your doctor or health care professional if you should avoid alcohol. Many nonprescription cough and cold products contain sugar or alcohol. These can affect blood sugar. This medicine may cause ovulation in premenopausal women who do not have regular monthly periods. This may increase your chances of becoming pregnant. You should not take this medicine if you become pregnant or think you may be pregnant. Talk with your doctor or health care professional about your birth control options while taking this medicine. Contact your doctor or health care professional right away if think you are pregnant. If you are going to need surgery, a MRI, CT scan, or other procedure, tell your doctor that you are taking this medicine. You may need to stop taking this medicine before the procedure. Wear a medical ID bracelet or chain, and carry a card that describes your disease and details of your medicine and dosage times. What side effects may I notice from receiving this medicine? Side effects that you should report to your doctor or health care professional as soon as possible: -allergic reactions like skin rash, itching or hives, swelling of the face, lips, or tongue -breathing problems -feeling faint or lightheaded, falls -muscle aches or pains -signs and symptoms of low blood sugar such as feeling anxious, confusion, dizziness, increased hunger, unusually weak or tired, sweating, shakiness, cold, irritable, headache, blurred vision, fast heartbeat, loss of consciousness -slow or irregular heartbeat -unusual stomach pain or discomfort -unusually tired or weak Side effects that  usually do not require medical attention (report to your doctor or health care professional if they continue or are bothersome): -diarrhea -headache -heartburn -metallic taste in mouth -nausea -stomach gas, upset This list may not describe all possible side effects. Call your doctor for medical advice about side effects. You may report side effects to FDA at 1-800-FDA-1088. Where should I keep my medicine? Keep out of the reach of children. Store at room temperature between 15 and 30 degrees C (59 and 86 degrees F). Protect from moisture and light. Throw away any unused medicine after the expiration date. NOTE: This sheet is a summary. It may not cover all possible information. If you have questions about this medicine, talk to your doctor, pharmacist, or health care provider.  2018 [employer] Standard (2016-01-16 15:34:19) Insulin Aspart injection What is this medicine? INSULIN ASPART (IN su lin AS part) is a human-made form of insulin. This drug lowers the amount of sugar in your blood. It is a fast acting insulin that starts working faster than regular insulin. It will not work as long as regular insulin. This medicine may be used for other purposes; ask your health care provider or pharmacist if you have questions. COMMON BRAND NAME(S): Fiasp, [employer], NovoLog, NovoLog Flexpen, NovoLog PenFill What should I tell my health care provider before I take this medicine? They need to know if you have any of these conditions: -episodes of low blood sugar -kidney disease -liver disease -an unusual or allergic reaction to insulin, metacresol, other medicines, foods, dyes, or preservatives -pregnant or trying to get pregnant -breast-feeding How should I use this medicine? This medicine is for injection under the skin. Use exactly as  directed. It is important to follow the directions given to you by your health care professional or doctor. If you are using Novolog, you should start  your meal within 5 to 10 minutes after injection. If you are using Fiasp, you should start your meal at the time of injection or within 20 minutes after injection. Have food ready before injection. Do not delay eating. You will be taught how to use this medicine and how to adjust doses for activities and illness. Do not use more insulin than prescribed. Do not use more or less often than prescribed. Always check the appearance of your insulin before using it. This medicine should be clear and colorless like water. Do not use if it is cloudy, thickened, colored, or has solid particles in it. It is important that you put your used needles and syringes in a special sharps container. Do not put them in a trash can. If you do not have a sharps container, call your pharmacist or healthcare provider to get one. Talk to your pediatrician regarding the use of this medicine in children. While Novolog may be prescribed for children as young as 54 years of age for selected conditions, precautions do apply. [employer] is not approved for use in children. Overdosage: If you think you have taken too much of this medicine contact a poison control center or emergency room at once. NOTE: This medicine is only for you. Do not share this medicine with others. What if I miss a dose? It is important not to miss a dose. Your health care professional or doctor should discuss a plan for missed doses with you. If you do miss a dose, follow their plan. Do not take double doses. What may interact with this medicine? -other medicines for diabetes Many medications may cause an increase or decrease in blood sugar, these include: -alcohol containing beverages -antiviral medicines for HIV or AIDS -aspirin and aspirin-like drugs -certain medicines for depression, anxiety, or psychotic disturbances -chromium -diuretics -male hormones, like estrogens or progestins and birth control pills -heart medicines -isoniazid -MAOIs like Carbex,  Eldepryl, Marplan, Nardil, and Parnate -male hormones or anabolic steroids -medicines for weight loss -medicines for allergies, asthma, cold, or cough -niacin -NSAIDs, medicines for pain and inflammation, like ibuprofen or naproxen -octreotide -pentamidine -phenytoin -probenecid -quinolone antibiotics like ciprofloxacin, levofloxacin, ofloxacin -some herbal dietary supplements -steroid medicines like prednisone or cortisone -sulfamethoxazole; trimethoprim -thyroid medicine Some medications can hide the warning symptoms of low blood sugar. You may need to monitor your blood sugar more closely if you are taking one of these medications. These include: -beta-blockers such as atenolol, metoprolol, propranolol -clonidine -guanethidine -reserpine This list may not describe all possible interactions. Give your health care provider a list of all the medicines, herbs, non-prescription drugs, or dietary supplements you use. Also tell them if you smoke, drink alcohol, or use illegal drugs. Some items may interact with your medicine. What should I watch for while using this medicine? Visit your health care professional or doctor for regular checks on your progress. A test called the HbA1C (A1C) will be monitored. This is a simple blood test. It measures your blood sugar control over the last 2 to 3 months. You will receive this test every 3 to 6 months. Learn how to check your blood sugar. Learn the symptoms of low and high blood sugar and how to manage them. Always carry a quick-source of sugar with you in case you have symptoms of low blood sugar. Examples include hard  sugar candy or glucose tablets. Make sure others know that you can choke if you eat or drink when you develop serious symptoms of low blood sugar, such as seizures or unconsciousness. They must get medical help at once. Tell your doctor or health care professional if you have high blood sugar. You might need to change the dose of your  medicine. If you are sick or exercising more than usual, you might need to change the dose of your medicine. Do not skip meals. Ask your doctor or health care professional if you should avoid alcohol. Many nonprescription cough and cold products contain sugar or alcohol. These can affect blood sugar. Make sure that you have the right kind of syringe for the type of insulin you use. Try not to change the brand and type of insulin or syringe unless your health care professional or doctor tells you to. Switching insulin brand or type can cause dangerously high or low blood sugar. Always keep an extra supply of insulin, syringes, and needles on hand. Use a syringe one time only. Throw away syringe and needle in a closed container to prevent accidental needle sticks. Insulin pens and cartridges should never be shared. Even if the needle is changed, sharing may result in passing of viruses like hepatitis or HIV. Wear a medical ID bracelet or chain, and carry a card that describes your disease and details of your medicine and dosage times. What side effects may I notice from receiving this medicine? Side effects that you should report to your doctor or health care professional as soon as possible: -allergic reactions like skin rash, itching or hives, swelling of the face, lips, or tongue -breathing problems -signs and symptoms of high blood sugar such as dizziness, dry mouth, dry skin, fruity breath, nausea, stomach pain, increased hunger or thirst, increased urination -signs and symptoms of low blood sugar such as feeling anxious, confusion, dizziness, increased hunger, unusually weak or tired, sweating, shakiness, cold, irritable, headache, blurred vision, fast heartbeat, loss of consciousness Side effects that usually do not require medical attention (report to your doctor or health care professional if they continue or are bothersome): -increase or decrease in fatty tissue under the skin due to overuse of a  particular injection site -itching, burning, swelling, or rash at site where injected This list may not describe all possible side effects. Call your doctor for medical advice about side effects. You may report side effects to FDA at 1-800-FDA-1088. Where should I keep my medicine? Keep out of the reach of children. Store unopened insulin vials in a refrigerator between 2 and 8 degrees C (36 and 46 degrees F). Do not freeze or use if the insulin has been frozen. Opened vials (vials currently in use) may be stored in the refrigerator or at room temperature, at approximately 30 degrees C (86 degrees F) or cooler. Keeping your insulin at room temperature decreases the amount of pain during injection. Once opened, your insulin can be used for 28 days. After 28 days, the vial of insulin should be thrown away. Store unopened cartridges, Novolog FlexPens,or [employer] in a refrigerator between 2 and 8 degrees C (36 and 46 degrees F.) Do not freeze or use if the insulin has been frozen. Once opened, the Novalog FlexPen and cartridges that are inserted into pens should be kept at room temperature, approximately 25 degrees C (77 degrees F) or cooler for up to 28 days; do not store in the refrigerator. The opened [employer]  can be stored at room temperature or refrigerated for up to 28 days. After 28 days, any unused insulin in any of these opened products should be thrown away. Protect from light and excessive heat. Throw away any unused medicine after the expiration date or after the specified time for room temperature storage has passed. NOTE: This sheet is a summary. It may not cover all possible information. If you have questions about this medicine, talk to your doctor, pharmacist, or health care provider.  2018 Elsevier/Gold Standard (2016-05-05 13:55:03)    Glipizide tablets What is this medicine? GLIPIZIDE (GLIP i zide) helps to treat type 2 diabetes. Treatment is combined with diet  and exercise. The medicine helps your body to use insulin better. This medicine may be used for other purposes; ask your health care provider or pharmacist if you have questions. COMMON BRAND NAME(S): Glucotrol What should I tell my health care provider before I take this medicine? They need to know if you have any of these conditions: -diabetic ketoacidosis -glucose-6-phosphate dehydrogenase deficiency -heart disease -kidney disease -liver disease -porphyria -severe infection or injury -thyroid disease -an unusual or allergic reaction to glipizide, sulfa drugs, other medicines, foods, dyes, or preservatives -pregnant or trying to get pregnant -breast-feeding How should I use this medicine? Take this medicine by mouth. Swallow with a drink of water. Do not take with food. Take it 30 minutes before a meal. Follow the directions on the prescription label. If you take this medicine once a day, take it 30 minutes before breakfast. Take your doses at the same time each day. Do not take more often than directed. Talk to your pediatrician regarding the use of this medicine in children. Special care may be needed. Elderly patients over 47 years old may have a stronger reaction and need a smaller dose. Overdosage: If you think you have taken too much of this medicine contact a poison control center or emergency room at once. NOTE: This medicine is only for you. Do not share this medicine with others. What if I miss a dose? If you miss a dose, take it as soon as you can. If it is almost time for your next dose, take only that dose. Do not take double or extra doses. What may interact with this medicine? -bosentan -chloramphenicol -cisapride -clarithromycin -medicines for fungal or yeast infections -metoclopramide -probenecid -warfarin Many medications may cause an increase or decrease in blood sugar, these include: -alcohol containing beverages -aspirin and aspirin-like  drugs -chloramphenicol -chromium -diuretics -male hormones, like estrogens or progestins and birth control pills -heart medicines -isoniazid -male hormones or anabolic steroids -medicines for weight loss -medicines for allergies, asthma, cold, or cough -medicines for mental problems -medicines called MAO Inhibitors like Nardil, Parnate, Marplan, Eldepryl -niacin -NSAIDs, medicines for pain and inflammation, like ibuprofen or naproxen -pentamidine -phenytoin -probenecid -quinolone antibiotics like ciprofloxacin, levofloxacin, ofloxacin -some herbal dietary supplements -steroid medicines like prednisone or cortisone -thyroid medicine This list may not describe all possible interactions. Give your health care provider a list of all the medicines, herbs, non-prescription drugs, or dietary supplements you use. Also tell them if you smoke, drink alcohol, or use illegal drugs. Some items may interact with your medicine. What should I watch for while using this medicine? Visit your doctor or health care professional for regular checks on your progress. A test called the HbA1C (A1C) will be monitored. This is a simple blood test. It measures your blood sugar control over the last 2 to 3 months. You  will receive this test every 3 to 6 months. Learn how to check your blood sugar. Learn the symptoms of low and high blood sugar and how to manage them. Always carry a quick-source of sugar with you in case you have symptoms of low blood sugar. Examples include hard sugar candy or glucose tablets. Make sure others know that you can choke if you eat or drink when you develop serious symptoms of low blood sugar, such as seizures or unconsciousness. They must get medical help at once. Tell your doctor or health care professional if you have high blood sugar. You might need to change the dose of your medicine. If you are sick or exercising more than usual, you might need to change the dose of your  medicine. Do not skip meals. Ask your doctor or health care professional if you should avoid alcohol. Many nonprescription cough and cold products contain sugar or alcohol. These can affect blood sugar. This medicine can make you more sensitive to the sun. Keep out of the sun. If you cannot avoid being in the sun, wear protective clothing and use sunscreen. Do not use sun lamps or tanning beds/booths. Wear a medical ID bracelet or chain, and carry a card that describes your disease and details of your medicine and dosage times. What side effects may I notice from receiving this medicine? Side effects that you should report to your doctor or health care professional as soon as possible: -allergic reactions like skin rash, itching or hives, swelling of the face, lips, or tongue -breathing problems -dark urine -fever, chills, sore throat -signs and symptoms of low blood sugar such as feeling anxious, confusion, dizziness, increased hunger, unusually weak or tired, sweating, shakiness, cold, irritable, headache, blurred vision, fast heartbeat, loss of consciousness -unusual bleeding or bruising -yellowing of the eyes or skin Side effects that usually do not require medical attention (report to your doctor or health care professional if they continue or are bothersome): -diarrhea -dizziness -headache -heartburn -nausea -stomach gas This list may not describe all possible side effects. Call your doctor for medical advice about side effects. You may report side effects to FDA at 1-800-FDA-1088. Where should I keep my medicine? Keep out of the reach of children. Store at room temperature below 30 degrees C (86 degrees F). Throw away any unused medicine after the expiration date. NOTE: This sheet is a summary. It may not cover all possible information. If you have questions about this medicine, talk to your doctor, pharmacist, or health care provider.  2018 Elsevier/Gold Standard (2012-10-20  14:42:46)     Omeprazole capsules (sprinkle caps) - Rx What is this medicine? OMEPRAZOLE (oh ME pray zol) prevents the production of acid in the stomach. It is used to treat gastroesophageal reflux disease (GERD), ulcers, certain bacteria in the stomach, inflammation of the esophagus, and Zollinger-Ellison Syndrome. It is also used to treat other conditions that cause too much stomach acid. This medicine may be used for other purposes; ask your health care provider or pharmacist if you have questions. COMMON BRAND NAME(S): Prilosec What should I tell my health care provider before I take this medicine? They need to know if you have any of these conditions: -liver disease -low levels of magnesium in the blood -lupus -an unusual or allergic reaction to omeprazole, other medicines, foods, dyes, or preservatives -pregnant or trying to get pregnant -breast-feeding How should I use this medicine? Take this medicine by mouth with a glass of water. Follow the directions on  the prescription label. Do not crush, break or chew the capsules. They can be opened and the contents sprinkled on a small amount of applesauce or yogurt, given with fruit juices, or swallowed immediately with water. This medicine works best if taken on an empty stomach 30 to 60 minutes before breakfast. Take your doses at regular intervals. Do not take your medicine more often than directed. Talk to your pediatrician regarding the use of this medicine in children. Special care may be needed. Overdosage: If you think you have taken too much of this medicine contact a poison control center or emergency room at once. NOTE: This medicine is only for you. Do not share this medicine with others. What if I miss a dose? If you miss a dose, take it as soon as you can. If it is almost time for your next dose, take only that dose. Do not take double or extra doses. What may interact with this medicine? Do not take this medicine with any of  the following medications: -atazanavir -clopidogrel -nelfinavir This medicine may also interact with the following medications: -ampicillin -certain medicines for anxiety or sleep -certain medicines that treat or prevent blood clots like warfarin -cyclosporine -diazepam -digoxin -disulfiram -diuretics -iron salts -methotrexate -mycophenolate mofetil -phenytoin -prescription medicine for fungal or yeast infection like itraconazole, ketoconazole, voriconazole -saquinavir -tacrolimus This list may not describe all possible interactions. Give your health care provider a list of all the medicines, herbs, non-prescription drugs, or dietary supplements you use. Also tell them if you smoke, drink alcohol, or use illegal drugs. Some items may interact with your medicine. What should I watch for while using this medicine? It can take several days before your stomach pain gets better. Check with your doctor or health care professional if your condition does not start to get better, or if it gets worse. You may need blood work done while you are taking this medicine. What side effects may I notice from receiving this medicine? Side effects that you should report to your doctor or health care professional as soon as possible: -allergic reactions like skin rash, itching or hives, swelling of the face, lips, or tongue -bone, muscle or joint pain -breathing problems -chest pain or chest tightness -dark yellow or brown urine -dizziness -fast, irregular heartbeat -feeling faint or lightheaded -fever or sore throat -muscle spasm -palpitations -rash on cheeks or arms that gets worse in the sun -redness, blistering, peeling or loosening of the skin, including inside the mouth -seizures -tremors -unusual bleeding or bruising -unusually weak or tired -yellowing of the eyes or skin Side effects that usually do not require medical attention (report to your doctor or health care professional if  they continue or are bothersome): -constipation -diarrhea -dry mouth -headache -nausea This list may not describe all possible side effects. Call your doctor for medical advice about side effects. You may report side effects to FDA at 1-800-FDA-1088. Where should I keep my medicine? Keep out of the reach of children. Store at room temperature between 15 and 30 degrees C (59 and 86 degrees F). Protect from light and moisture. Throw away any unused medicine after the expiration date. NOTE: This sheet is a summary. It may not cover all possible information. If you have questions about this medicine, talk to your doctor, pharmacist, or health care provider.  2018 Elsevier/Gold Standard (2015-08-09 12:18:47)      Insulin Glargine injection What is this medicine? INSULIN GLARGINE (IN su lin GLAR geen) is a human-made form of  insulin. This drug lowers the amount of sugar in your blood. It is a long-acting insulin that is usually given once a day. This medicine may be used for other purposes; ask your health care provider or pharmacist if you have questions. COMMON BRAND NAME(S): BASAGLAR, Lantus, Lantus SoloStar, Toujeo SoloStar What should I tell my health care provider before I take this medicine? They need to know if you have any of these conditions: -episodes of low blood sugar -kidney disease -liver disease -an unusual or allergic reaction to insulin, metacresol, other medicines, foods, dyes, or preservatives -pregnant or trying to get pregnant -breast-feeding How should I use this medicine? This medicine is for injection under the skin. Use this medicine at the same time each day. Use exactly as directed. This insulin should never be mixed in the same syringe with other insulins before injection. Do not vigorously shake before use. You will be taught how to use this medicine and how to adjust doses for activities and illness. Do not use more insulin than prescribed. Always check the  appearance of your insulin before using it. This medicine should be clear and colorless like water. Do not use it if it is cloudy, thickened, colored, or has solid particles in it. It is important that you put your used needles and syringes in a special sharps container. Do not put them in a trash can. If you do not have a sharps container, call your pharmacist or healthcare provider to get one. Talk to your pediatrician regarding the use of this medicine in children. Special care may be needed. Overdosage: If you think you have taken too much of this medicine contact a poison control center or emergency room at once. NOTE: This medicine is only for you. Do not share this medicine with others. What if I miss a dose? It is important not to miss a dose. Your health care professional or doctor should discuss a plan for missed doses with you. If you do miss a dose, follow their plan. Do not take double doses. What may interact with this medicine? -other medicines for diabetes Many medications may cause changes in blood sugar, these include: -alcohol containing beverages -antiviral medicines for HIV or AIDS -aspirin and aspirin-like drugs -certain medicines for blood pressure, heart disease, irregular heart beat -chromium -diuretics -male hormones, such as estrogens or progestins, birth control pills -fenofibrate -gemfibrozil -isoniazid -lanreotide -male hormones or anabolic steroids -MAOIs like Carbex, Eldepryl, Marplan, Nardil, and Parnate -medicines for weight loss -medicines for allergies, asthma, cold, or cough -medicines for depression, anxiety, or psychotic disturbances -niacin -nicotine -NSAIDs, medicines for pain and inflammation, like ibuprofen or naproxen -octreotide -pasireotide -pentamidine -phenytoin -probenecid -quinolone antibiotics such as ciprofloxacin, levofloxacin, ofloxacin -some herbal dietary supplements -steroid medicines such as prednisone or  cortisone -sulfamethoxazole; trimethoprim -thyroid hormones Some medications can hide the warning symptoms of low blood sugar (hypoglycemia). You may need to monitor your blood sugar more closely if you are taking one of these medications. These include: -beta-blockers, often used for high blood pressure or heart problems (examples include atenolol, metoprolol, propranolol) -clonidine -guanethidine -reserpine This list may not describe all possible interactions. Give your health care provider a list of all the medicines, herbs, non-prescription drugs, or dietary supplements you use. Also tell them if you smoke, drink alcohol, or use illegal drugs. Some items may interact with your medicine. What should I watch for while using this medicine? Visit your health care professional or doctor for regular checks on your progress. Do  not drive, use machinery, or do anything that needs mental alertness until you know how this medicine affects you. Alcohol may interfere with the effect of this medicine. Avoid alcoholic drinks. A test called the HbA1C (A1C) will be monitored. This is a simple blood test. It measures your blood sugar control over the last 2 to 3 months. You will receive this test every 3 to 6 months. Learn how to check your blood sugar. Learn the symptoms of low and high blood sugar and how to manage them. Always carry a quick-source of sugar with you in case you have symptoms of low blood sugar. Examples include hard sugar candy or glucose tablets. Make sure others know that you can choke if you eat or drink when you develop serious symptoms of low blood sugar, such as seizures or unconsciousness. They must get medical help at once. Tell your doctor or health care professional if you have high blood sugar. You might need to change the dose of your medicine. If you are sick or exercising more than usual, you might need to change the dose of your medicine. Do not skip meals. Ask your doctor or  health care professional if you should avoid alcohol. Many nonprescription cough and cold products contain sugar or alcohol. These can affect blood sugar. Make sure that you have the right kind of syringe for the type of insulin you use. Try not to change the brand and type of insulin or syringe unless your health care professional or doctor tells you to. Switching insulin brand or type can cause dangerously high or low blood sugar. Always keep an extra supply of insulin, syringes, and needles on hand. Use a syringe one time only. Throw away syringe and needle in a closed container to prevent accidental needle sticks. Insulin pens and cartridges should never be shared. Even if the needle is changed, sharing may result in passing of viruses like hepatitis or HIV. Wear a medical ID bracelet or chain, and carry a card that describes your disease and details of your medicine and dosage times. What side effects may I notice from receiving this medicine? Side effects that you should report to your doctor or health care professional as soon as possible: -allergic reactions like skin rash, itching or hives, swelling of the face, lips, or tongue -breathing problems -signs and symptoms of high blood sugar such as dizziness, dry mouth, dry skin, fruity breath, nausea, stomach pain, increased hunger or thirst, increased urination -signs and symptoms of low blood sugar such as feeling anxious, confusion, dizziness, increased hunger, unusually weak or tired, sweating, shakiness, cold, irritable, headache, blurred vision, fast heartbeat, loss of consciousness Side effects that usually do not require medical attention (report to your doctor or health care professional if they continue or are bothersome): -increase or decrease in fatty tissue under the skin due to overuse of a particular injection site -itching, burning, swelling, or rash at site where injected This list may not describe all possible side effects. Call  your doctor for medical advice about side effects. You may report side effects to FDA at 1-800-FDA-1088. Where should I keep my medicine? Keep out of the reach of children. Store unopened vials in a refrigerator between 2 and 8 degrees C (36 and 46 degrees F). Do not freeze or use if the insulin has been frozen. Opened vials (vials currently in use) may be stored in the refrigerator or at room temperature, at approximately 25 degrees C (77 degrees F) or cooler. Keeping  your insulin at room temperature decreases the amount of pain during injection. Once opened, your insulin can be used for 28 days. After 28 days, the vial should be thrown away. Store Lantus Surveyor, mining in a refrigerator between 2 and 8 degrees C (36 and 46 degrees F) or at room temperature below 30 degrees C (86 degrees F). Do not freeze or use if the insulin has been frozen. Once opened, the pens should be kept at room temperature. Do not store in the refrigerator once opened. Once opened, the insulin can be used for 28 days. After 28 days, the Lantus Solostar Pen or Basaglar KwikPen should be thrown away. Store [employer] in a refrigerator between 2 and 8 degrees C (36 and 46 degrees F). Do not freeze or use if the insulin has been frozen. Once opened, the pens should be kept at room temperature below 30 degrees C (86 degrees F). Do not store in the refrigerator once opened. Once opened, the insulin can be used for 42 days. After 42 days, the [employer] Pen should be thrown away. Protect from light and excessive heat. Throw away any unused medicine after the expiration date or after the specified time for room temperature storage has passed. NOTE: This sheet is a summary. It may not cover all possible information. If you have questions about this medicine, talk to your doctor, pharmacist, or health care provider.  2018 Elsevier/Gold Standard (2016-07-23 10:26:25)     DASH Eating Plan DASH stands  for "Dietary Approaches to Stop Hypertension." The DASH eating plan is a healthy eating plan that has been shown to reduce high blood pressure (hypertension). It may also reduce your risk for type 2 diabetes, heart disease, and stroke. The DASH eating plan may also help with weight loss. What are tips for following this plan? General guidelines  Avoid eating more than 2,300 mg (milligrams) of salt (sodium) a day. If you have hypertension, you may need to reduce your sodium intake to 1,500 mg a day.  Limit alcohol intake to no more than 1 drink a day for nonpregnant women and 2 drinks a day for men. One drink equals 12 oz of beer, 5 oz of wine, or 1 oz of hard liquor.  Work with your health care provider to maintain a healthy body weight or to lose weight. Ask what an ideal weight is for you.  Get at least 30 minutes of exercise that causes your heart to beat faster (aerobic exercise) most days of the week. Activities may include walking, swimming, or biking.  Work with your health care provider or diet and nutrition specialist (dietitian) to adjust your eating plan to your individual calorie needs. Reading food labels  Check food labels for the amount of sodium per serving. Choose foods with less than 5 percent of the Daily Value of sodium. Generally, foods with less than 300 mg of sodium per serving fit into this eating plan.  To find whole grains, look for the word "whole" as the first word in the ingredient list. Shopping  Buy products labeled as "low-sodium" or "no salt added."  Buy fresh foods. Avoid canned foods and premade or frozen meals. Cooking  Avoid adding salt when cooking. Use salt-free seasonings or herbs instead of table salt or sea salt. Check with your health care provider or pharmacist before using salt substitutes.  Do not fry foods. Cook foods using healthy methods such as baking, boiling, grilling, and broiling instead.  Cook with heart-healthy oils, such as olive,  canola, soybean, or sunflower oil. Meal planning   Eat a balanced diet that includes: ? 5 or more servings of fruits and vegetables each day. At each meal, try to fill half of your plate with fruits and vegetables. ? Up to 6-8 servings of whole grains each day. ? Less than 6 oz of lean meat, poultry, or fish each day. A 3-oz serving of meat is about the same size as a deck of cards. One egg equals 1 oz. ? 2 servings of low-fat dairy each day. ? A serving of nuts, seeds, or beans 5 times each week. ? Heart-healthy fats. Healthy fats called Omega-3 fatty acids are found in foods such as flaxseeds and coldwater fish, like sardines, salmon, and mackerel.  Limit how much you eat of the following: ? Canned or prepackaged foods. ? Food that is high in trans fat, such as fried foods. ? Food that is high in saturated fat, such as fatty meat. ? Sweets, desserts, sugary drinks, and other foods with added sugar. ? Full-fat dairy products.  Do not salt foods before eating.  Try to eat at least 2 vegetarian meals each week.  Eat more home-cooked food and less restaurant, buffet, and fast food.  When eating at a restaurant, ask that your food be prepared with less salt or no salt, if possible. What foods are recommended? The items listed may not be a complete list. Talk with your dietitian about what dietary choices are best for you. Grains Whole-grain or whole-wheat bread. Whole-grain or whole-wheat pasta. Brown rice. [place]. Bulgur. Whole-grain and low-sodium cereals. Pita bread. Low-fat, low-sodium crackers. Whole-wheat flour tortillas. Vegetables Fresh or frozen vegetables (raw, steamed, roasted, or grilled). Low-sodium or reduced-sodium tomato and vegetable juice. Low-sodium or reduced-sodium tomato sauce and tomato paste. Low-sodium or reduced-sodium canned vegetables. Fruits All fresh, dried, or frozen fruit. Canned fruit in natural juice (without added sugar). Meat and other  protein foods Skinless chicken or [place]. Ground chicken or [place]. Pork with fat trimmed off. Fish and seafood. Egg whites. Dried beans, peas, or lentils. Unsalted nuts, nut butters, and seeds. Unsalted canned beans. Lean cuts of beef with fat trimmed off. Low-sodium, lean deli meat. Dairy Low-fat (1%) or fat-free (skim) milk. Fat-free, low-fat, or reduced-fat cheeses. Nonfat, low-sodium ricotta or cottage cheese. Low-fat or nonfat yogurt. Low-fat, low-sodium cheese. Fats and oils Soft margarine without trans fats. Vegetable oil. Low-fat, reduced-fat, or light mayonnaise and salad dressings (reduced-sodium). Canola, safflower, olive, soybean, and sunflower oils. Avocado. Seasoning and other foods Herbs. Spices. Seasoning mixes without salt. Unsalted popcorn and pretzels. Fat-free sweets. What foods are not recommended? The items listed may not be a complete list. Talk with your dietitian about what dietary choices are best for you. Grains Baked goods made with fat, such as croissants, muffins, or some breads. Dry pasta or rice meal packs. Vegetables Creamed or fried vegetables. Vegetables in a cheese sauce. Regular canned vegetables (not low-sodium or reduced-sodium). Regular canned tomato sauce and paste (not low-sodium or reduced-sodium). Regular tomato and vegetable juice (not low-sodium or reduced-sodium). Angie Fava. Olives. Fruits Canned fruit in a light or heavy syrup. Fried fruit. Fruit in cream or butter sauce. Meat and other protein foods Fatty cuts of meat. Ribs. Fried meat. Berniece Salines. Sausage. Bologna and other processed lunch meats. Salami. Fatback. Hotdogs. Bratwurst. Salted nuts and seeds. Canned beans with added salt. Canned or smoked fish. Whole eggs or egg yolks. Chicken or [place] with skin. Dairy  Whole or 2% milk, cream, and half-and-half. Whole or full-fat cream cheese. Whole-fat or sweetened yogurt. Full-fat cheese. Nondairy creamers. Whipped toppings. Processed cheese and cheese  spreads. Fats and oils Butter. Stick margarine. Lard. Shortening. Ghee. Bacon fat. Tropical oils, such as coconut, palm kernel, or palm oil. Seasoning and other foods Salted popcorn and pretzels. Onion salt, garlic salt, seasoned salt, table salt, and sea salt. Worcestershire sauce. Tartar sauce. Barbecue sauce. Teriyaki sauce. Soy sauce, including reduced-sodium. Steak sauce. Canned and packaged gravies. Fish sauce. Oyster sauce. Cocktail sauce. Horseradish that you find on the shelf. Ketchup. Mustard. Meat flavorings and tenderizers. Bouillon cubes. Hot sauce and Tabasco sauce. Premade or packaged marinades. Premade or packaged taco seasonings. Relishes. Regular salad dressings. Where to find more information:  National Heart, Lung, and [place]: https://wilson-eaton.com/  American Heart Association: www.heart.org Summary  The DASH eating plan is a healthy eating plan that has been shown to reduce high blood pressure (hypertension). It may also reduce your risk for type 2 diabetes, heart disease, and stroke.  With the DASH eating plan, you should limit salt (sodium) intake to 2,300 mg a day. If you have hypertension, you may need to reduce your sodium intake to 1,500 mg a day.  When on the DASH eating plan, aim to eat more fresh fruits and vegetables, whole grains, lean proteins, low-fat dairy, and heart-healthy fats.  Work with your health care provider or diet and nutrition specialist (dietitian) to adjust your eating plan to your individual calorie needs. This information is not intended to replace advice given to you by your health care provider. Make sure you discuss any questions you have with your health care provider. Document Released: 06/26/2011 Document Revised: 06/30/2016 Document Reviewed: 06/30/2016 Elsevier Interactive Patient Education  2018 [place] Heart-healthy meal planning includes:  Limiting unhealthy fats.  Increasing healthy  fats.  Making other small dietary changes.  You may need to talk with your doctor or a diet specialist (dietitian) to create an eating plan that is right for you. What types of fat should I choose?  Choose healthy fats. These include olive oil and canola oil, flaxseeds, walnuts, almonds, and seeds.  Eat more omega-3 fats. These include salmon, mackerel, sardines, tuna, flaxseed oil, and ground flaxseeds. Try to eat fish at least twice each week.  Limit saturated fats. ? Saturated fats are often found in animal products, such as meats, butter, and cream. ? Plant sources of saturated fats include palm oil, palm kernel oil, and coconut oil.  Avoid foods with partially hydrogenated oils in them. These include stick margarine, some tub margarines, cookies, crackers, and other baked goods. These contain trans fats. What general guidelines do I need to follow?  Check food labels carefully. Identify foods with trans fats or high amounts of saturated fat.  Fill one half of your plate with vegetables and green salads. Eat 4-5 servings of vegetables per day. A serving of vegetables is: ? 1 cup of raw leafy vegetables. ?  cup of raw or cooked cut-up vegetables. ?  cup of vegetable juice.  Fill one fourth of your plate with whole grains. Look for the word "whole" as the first word in the ingredient list.  Fill one fourth of your plate with lean protein foods.  Eat 4-5 servings of fruit per day. A serving of fruit is: ? One medium whole fruit. ?  cup of dried fruit. ?  cup of fresh, frozen, or canned fruit. ?  cup  of 100% fruit juice.  Eat more foods that contain soluble fiber. These include apples, broccoli, carrots, beans, peas, and barley. Try to get 20-30 g of fiber per day.  Eat more home-cooked food. Eat less restaurant, buffet, and fast food.  Limit or avoid alcohol.  Limit foods high in starch and sugar.  Avoid fried foods.  Avoid frying your food. Try baking, boiling,  grilling, or broiling it instead. You can also reduce fat by: ? Removing the skin from poultry. ? Removing all visible fats from meats. ? Skimming the fat off of stews, soups, and gravies before serving them. ? Steaming vegetables in water or broth.  Lose weight if you are overweight.  Eat 4-5 servings of nuts, legumes, and seeds per week: ? One serving of dried beans or legumes equals  cup after being cooked. ? One serving of nuts equals 1 ounces. ? One serving of seeds equals  ounce or one tablespoon.  You may need to keep track of how much salt or sodium you eat. This is especially true if you have high blood pressure. Talk with your doctor or dietitian to get more information. What foods can I eat? Grains Breads, including [place], white, pita, wheat, raisin, rye, oatmeal, and [place]. Tortillas that are neither fried nor made with lard or trans fat. Low-fat rolls, including hotdog and hamburger buns and English muffins. Biscuits. Muffins. Waffles. Pancakes. Light popcorn. Whole-grain cereals. Flatbread. Melba toast. Pretzels. Breadsticks. Rusks. Low-fat snacks. Low-fat crackers, including oyster, saltine, matzo, [place], animal, and rye. Rice and pasta, including brown rice and pastas that are made with whole wheat. Vegetables All vegetables. Fruits All fruits, but limit coconut. Meats and Other Protein Sources Lean, well-trimmed beef, veal, pork, and lamb. Chicken and [place] without skin. All fish and shellfish. Wild duck, rabbit, pheasant, and venison. Egg whites or low-cholesterol egg substitutes. Dried beans, peas, lentils, and tofu. Seeds and most nuts. Dairy Low-fat or nonfat cheeses, including ricotta, string, and mozzarella. Skim or 1% milk that is liquid, powdered, or evaporated. Buttermilk that is made with low-fat milk. Nonfat or low-fat yogurt. Beverages Mineral water. Diet carbonated beverages. Sweets and Desserts Sherbets and fruit ices. Honey, jam, marmalade, jelly,  and syrups. Meringues and gelatins. Pure sugar candy, such as hard candy, jelly beans, gumdrops, mints, marshmallows, and small amounts of dark chocolate. [employer] Eat all sweets and desserts in moderation. Fats and Oils Nonhydrogenated (trans-free) margarines. Vegetable oils, including soybean, sesame, sunflower, olive, peanut, safflower, corn, canola, and cottonseed. Salad dressings or mayonnaise made with a vegetable oil. Limit added fats and oils that you use for cooking, baking, salads, and as spreads. Other Cocoa powder. Coffee and tea. All seasonings and condiments. The items listed above may not be a complete list of recommended foods or beverages. Contact your dietitian for more options. What foods are not recommended? Grains Breads that are made with saturated or trans fats, oils, or whole milk. Croissants. Butter rolls. Cheese breads. Sweet rolls. Donuts. Buttered popcorn. Chow mein noodles. High-fat crackers, such as cheese or butter crackers. Meats and Other Protein Sources Fatty meats, such as hotdogs, short ribs, sausage, spareribs, bacon, rib eye roast or steak, and mutton. High-fat deli meats, such as salami and bologna. Caviar. Domestic duck and goose. Organ meats, such as kidney, liver, sweetbreads, and heart. Dairy Cream, sour cream, cream cheese, and creamed cottage cheese. Whole-milk cheeses, including blue (bleu), Monterey Jack, [place], [place], American, [place], Swiss, cheddar, [place], and [place]. Whole or 2% milk that  is liquid, evaporated, or condensed. Whole buttermilk. Cream sauce or high-fat cheese sauce. Yogurt that is made from whole milk. Beverages Regular sodas and juice drinks with added sugar. Sweets and Desserts Frosting. Pudding. Cookies. Cakes other than angel food cake. Candy that has milk chocolate or white chocolate, hydrogenated fat, butter, coconut, or unknown ingredients. Buttered syrups. Full-fat ice cream or ice cream drinks. Fats and  Oils Gravy that has suet, meat fat, or shortening. Cocoa butter, hydrogenated oils, palm oil, coconut oil, palm kernel oil. These can often be found in baked products, candy, fried foods, nondairy creamers, and whipped toppings. Solid fats and shortenings, including bacon fat, salt pork, lard, and butter. Nondairy cream substitutes, such as coffee creamers and sour cream substitutes. Salad dressings that are made of unknown oils, cheese, or sour cream. The items listed above may not be a complete list of foods and beverages to avoid. Contact your dietitian for more information. This information is not intended to replace advice given to you by your health care provider. Make sure you discuss any questions you have with your health care provider. Document Released: 01/06/2012 Document Revised: 12/13/2015 Document Reviewed: 12/29/2013 Elsevier Interactive Patient Education  2018 [place].     Blood Glucose Monitoring, Adult Monitoring your blood sugar (glucose) helps you manage your diabetes. It also helps you and your health care provider determine how well your diabetes management plan is working. Blood glucose monitoring involves checking your blood glucose as often as directed, and keeping a record (log) of your results over time. Why should I monitor my blood glucose? Checking your blood glucose regularly can:  Help you understand how food, exercise, illnesses, and medicines affect your blood glucose.  Let you know what your blood glucose is at any time. You can quickly tell if you are having low blood glucose (hypoglycemia) or high blood glucose (hyperglycemia).  Help you and your health care provider adjust your medicines as needed.  When should I check my blood glucose? Follow instructions from your health care provider about how often to check your blood glucose. This may depend on:  The type of diabetes you have.  How well-controlled your diabetes is.  Medicines you are  taking.  If you have type 1 diabetes:  Check your blood glucose at least 2 times a day.  Also check your blood glucose: ? Before every insulin injection. ? Before and after exercise. ? Between meals. ? 2 hours after a meal. ? Occasionally between 2:00 a.m. and 3:00 a.m., as directed. ? Before potentially dangerous tasks, like driving or using heavy machinery. ? At bedtime.  You may need to check your blood glucose more often, up to 6-10 times a day: ? If you use an insulin pump. ? If you need multiple daily injections (MDI). ? If your diabetes is not well-controlled. ? If you are ill. ? If you have a history of severe hypoglycemia. ? If you have a history of not knowing when your blood glucose is getting low (hypoglycemia unawareness). If you have type 2 diabetes:  If you take insulin or other diabetes medicines, check your blood glucose at least 2 times a day.  If you are on intensive insulin therapy, check your blood glucose at least 4 times a day. Occasionally, you may also need to check between 2:00 a.m. and 3:00 a.m., as directed.  Also check your blood glucose: ? Before and after exercise. ? Before potentially dangerous tasks, like driving or using heavy machinery.  You  may need to check your blood glucose more often if: ? Your medicine is being adjusted. ? Your diabetes is not well-controlled. ? You are ill. What is a blood glucose log?  A blood glucose log is a record of your blood glucose readings. It helps you and your health care provider: ? Look for patterns in your blood glucose over time. ? Adjust your diabetes management plan as needed.  Every time you check your blood glucose, write down your result and notes about things that may be affecting your blood glucose, such as your diet and exercise for the day.  Most glucose meters store a record of glucose readings in the meter. Some meters allow you to download your records to a computer. How do I check my  blood glucose? Follow these steps to get accurate readings of your blood glucose: Supplies needed   Blood glucose meter.  Test strips for your meter. Each meter has its own strips. You must use the strips that come with your meter.  A needle to prick your finger (lancet). Do not use lancets more than once.  A device that holds the lancet (lancing device).  A journal or log book to write down your results. Procedure  Wash your hands with soap and water.  Prick the side of your finger (not the tip) with the lancet. Use a different finger each time.  Gently rub the finger until a small drop of blood appears.  Follow instructions that come with your meter for inserting the test strip, applying blood to the strip, and using your blood glucose meter.  Write down your result and any notes. Alternative testing sites  Some meters allow you to use areas of your body other than your finger (alternative sites) to test your blood.  If you think you may have hypoglycemia, or if you have hypoglycemia unawareness, do not use alternative sites. Use your finger instead.  Alternative sites may not be as accurate as the fingers, because blood flow is slower in these areas. This means that the result you get may be delayed, and it may be different from the result that you would get from your finger.  The most common alternative sites are: ? Forearm. ? Thigh. ? Palm of the hand. Additional tips  Always keep your supplies with you.  If you have questions or need help, all blood glucose meters have a 24-hour "hotline" number that you can call. You may also contact your health care provider.  After you use a few boxes of test strips, adjust (calibrate) your blood glucose meter by following instructions that came with your meter. This information is not intended to replace advice given to you by your health care provider. Make sure you discuss any questions you have with your health care  provider. Document Released: 07/10/2003 Document Revised: 01/25/2016 Document Reviewed: 12/17/2015 Elsevier Interactive Patient Education  2017 [employer].

## 2018-06-29 NOTE — Progress Notes (Signed)
Hospital Follow Up  Subjective:    Patient ID: Rodney Kane, male    DOB: 1976/05/20, 42 y.o.   MRN: [id number]  Chief Complaint  Patient presents with  . Hospitalization Follow-up    HPI  Rodney Kane is a 42 year male with past medical history of Hypertension, and Diabetes. He is here for hospital follow up today.   Current Status: Since his last office visit, he is doing well with no complaints. He reports blurred vision. He reports that he has not been taking blood pressure medications. Denies chest pain, cough, shortness of breath, heart palpitations, and falls. He has occasionally headaches and dizziness with position changes. Denies severe headaches, confusion, seizures, double vision, and blurred vision, nausea and vomiting. He denies fatigue, frequent urination, excessive hunger, excessive thirst, weight gain, weight loss, and poor wound healing.   He denies fevers, chills, fatigue, recent infections, weight loss, and night sweats. No reports of GI problems such as nausea, vomiting, diarrhea, and constipation. He has no reports of blood in stools, dysuria and hematuria. No depression or anxiety reported. He denies pain today.   Review of Systems  Constitutional: Negative.   HENT: Negative.   Eyes: Positive for visual disturbance (blurry vision).  Respiratory: Negative.   Cardiovascular: Negative.   Gastrointestinal: Positive for abdominal distention (Obese), blood in stool (Occasion) and constipation.  Endocrine: Positive for polydipsia, polyphagia and polyuria.  Genitourinary: Negative.   Musculoskeletal: Positive for arthralgias.       Cramps in hands and feet.   Skin: Negative.   Allergic/Immunologic: Negative.   Neurological: Positive for dizziness and headaches.  Hematological: Negative.   Psychiatric/Behavioral: Negative.    Objective:   Physical Exam  Constitutional: He is oriented to person, place, and time. He appears well-developed and well-nourished.  HENT:   Head: Normocephalic and atraumatic.  Right Ear: External ear normal.  Eyes: Pupils are equal, round, and reactive to light. Conjunctivae and EOM are normal.  Neck: Normal range of motion. Neck supple.  Cardiovascular: Normal rate, regular rhythm, normal heart sounds and intact distal pulses.  Pulmonary/Chest: Effort normal and breath sounds normal.  Abdominal: Soft. Bowel sounds are normal.  Musculoskeletal: Normal range of motion.  Neurological: He is alert and oriented to person, place, and time.  Skin: Skin is warm and dry.  Psychiatric: He has a normal mood and affect. His behavior is normal. Judgment and thought content normal.  Nursing note and vitals reviewed.  Assessment & Plan:   1. Type 2 diabetes mellitus with complication, without long-term current use of insulin (HCC) Hgb A1c is increased, POCT machine unable to read. We will draw Hgb A1c and send via [facility]. We will initiate Insulin, Metformin, and Glucotrol today. He is to check blood glucose 3 times daily and QHS. He will continue to decrease foods/beverages high in sugars and carbs and follow Heart Healthy or DASH diet. Increase physical activity to at least 30 minutes cardio exercise daily.  - POCT urinalysis dipstick - [facility] Diabetes Foot Exam - insulin glargine (LANTUS) 100 unit/mL SOPN; Inject 0.35 mLs (35 Units total) into the skin at bedtime.  Dispense: 10.5 mL; Refill: 3 - blood glucose meter kit and supplies; Dispense based on patient and insurance preference. Use up to four times daily for blood glucose readings greater than 125.  Dispense: 1 each; Refill: 0 - glipiZIDE (GLUCOTROL) 10 MG tablet; Take 1 tablet (10 mg total) by mouth 2 (two) times daily before a meal.  Dispense: 60 tablet; Refill:  3  2. Essential hypertension Blood pressure is increased today. He received Clonidine 0.2 mg in office today n effort to decrease blood pressure. He will resume taking blood pressure medications today. He will continue to  decrease high sodium intake, excessive alcohol intake, increase potassium intake, smoking cessation, and increase physical activity of at least 30 minutes of cardio activity daily. He will continue to follow Heart Healthy or DASH diet. - cloNIDine (CATAPRES) tablet 0.2 mg  3. Elevated glucose Glucose level is at 599 today. He is positive for Ketones in urine. We admit him to [facility] and give him 1 liter of IVFs. We will re-assess blood glucose post IVFs.  - POCT glucose (manual entry) - insulin glargine (LANTUS) 100 unit/mL SOPN; Inject 0.35 mLs (35 Units total) into the skin at bedtime.  Dispense: 10.5 mL; Refill: 3 - blood glucose meter kit and supplies; Dispense based on patient and insurance preference. Use up to four times daily for blood glucose readings greater than 125.  Dispense: 1 each; Refill: 0 - glipiZIDE (GLUCOTROL) 10 MG tablet; Take 1 tablet (10 mg total) by mouth 2 (two) times daily before a meal.  Dispense: 60 tablet; Refill: 3 - POCT glucose (manual entry)  4. Gastroesophageal reflux disease without esophagitis We will initiate Prilosec today. Monitor.  - omeprazole (PRILOSEC) 40 MG capsule; Take 1 capsule (40 mg total) by mouth daily.  Dispense: 30 capsule; Refill: 3  5. Encounter for diabetic foot exam ([facility]) Negative. Foot exam tolerated well. No decreased sensitivity noted upon foot exam. Patient counseled on proper foot hygiene. She is encouraged to exam feet often (daily), using mirror if necessary; keep feet clean and dry (especially between toes), keep feet moistened, wear cotton socks, and avoid wearing open-toed shoes, high-heel shoes, and sandals. Patient verbalized understanding.   6. Healthcare maintenance - CBC with Differential - Comprehensive metabolic panel - TSH - PSA - HgB A1c  7. Follow up He will follow up in 1 week. .  Meds ordered this encounter  Medications  . cloNIDine (CATAPRES) tablet 0.2 mg  . insulin glargine (LANTUS) 100 unit/mL SOPN     Sig: Inject 0.35 mLs (35 Units total) into the skin at bedtime.    Dispense:  10.5 mL    Refill:  3  . insulin aspart (NOVOLOG) 100 UNIT/ML injection    Sig: Inject 15 Units into the skin 3 (three) times daily with meals.    Dispense:  10 mL    Refill:  11  . blood glucose meter kit and supplies    Sig: Dispense based on patient and insurance preference. Use up to four times daily for blood glucose readings greater than 125.    Dispense:  1 each    Refill:  0    Order Specific Question:   Number of strips    Answer:   100    Order Specific Question:   Number of lancets    Answer:   100  . metFORMIN (GLUCOPHAGE) 1000 MG tablet    Sig: Take 1 tablet (1,000 mg total) by mouth 2 (two) times daily with a meal.    Dispense:  180 tablet    Refill:  3  . glipiZIDE (GLUCOTROL) 10 MG tablet    Sig: Take 1 tablet (10 mg total) by mouth 2 (two) times daily before a meal.    Dispense:  60 tablet    Refill:  3  . omeprazole (PRILOSEC) 40 MG capsule    Sig: Take  1 capsule (40 mg total) by mouth daily.    Dispense:  30 capsule    Refill:  3  . carvedilol (COREG) 12.5 MG tablet    Sig: Take 1 tablet (12.5 mg total) by mouth 2 (two) times daily with a meal.    Dispense:  60 tablet    Refill:  3  . furosemide (LASIX) 20 MG tablet    Sig: Take 2 tablets (40 mg total) by mouth daily.    Dispense:  30 tablet    Refill:  3  . hydrALAZINE (APRESOLINE) 50 MG tablet    Sig: Take 1 tablet (50 mg total) by mouth 3 (three) times daily.    Dispense:  90 tablet    Refill:  3  . losartan (COZAAR) 25 MG tablet    Sig: Take 1 tablet (25 mg total) by mouth daily.    Dispense:  30 tablet    Refill:  [place],  MSN, FNP-C [facility] [address] [place], [place] [address] [phone]

## 2018-06-29 NOTE — Progress Notes (Signed)
cloniHm foo

## 2018-06-29 NOTE — Discharge Instructions (Signed)
Hyperglycemia Hyperglycemia is when the sugar (glucose) level in your blood is too high. It may not cause symptoms. If you do have symptoms, they may include warning signs, such as:  Feeling more thirsty than normal.  Hunger.  Feeling tired.  Needing to pee (urinate) more than normal.  Blurry eyesight (vision). You may get other symptoms as it gets worse, such as:  Dry mouth.  Not being hungry (loss of appetite).  Fruity-smelling breath.  Weakness.  Weight gain or loss that is not planned. Weight loss may be fast.  A tingling or numb feeling in your hands or feet.  Headache.  Skin that does not bounce back quickly when it is lightly pinched and released (poor skin turgor).  Pain in your belly (abdomen).  Cuts or bruises that heal slowly. High blood sugar can happen to people who do or do not have diabetes. High blood sugar can happen slowly or quickly, and it can be an emergency. Follow these instructions at home: General instructions  Take over-the-counter and prescription medicines only as told by your doctor.  Do not use products that contain nicotine or tobacco, such as cigarettes and e-cigarettes. If you need help quitting, ask your doctor.  Limit alcohol intake to no more than 1 drink per day for nonpregnant women and 2 drinks per day for men. One drink equals 12 oz of beer, 5 oz of wine, or 1 oz of hard liquor.  Manage stress. If you need help with this, ask your doctor.  Keep all follow-up visits as told by your doctor. This is important. Eating and drinking  Stay at a healthy weight.  Exercise regularly, as told by your doctor.  Drink enough fluid, especially when you:  Exercise.  Get sick.  Are in hot temperatures.  Eat healthy foods, such as:  Low-fat (lean) proteins.  Complex carbs (complex carbohydrates), such as whole wheat bread or brown rice.  Fresh fruits and vegetables.  Low-fat dairy products.  Healthy fats.  Drink enough  fluid to keep your pee (urine) clear or pale yellow. If you have diabetes:  Make sure you know the symptoms of hyperglycemia.  Follow your diabetes management plan, as told by your doctor. Make sure you:  Take insulin and medicines as told.  Follow your exercise plan.  Follow your meal plan. Eat on time. Do not skip meals.  Check your blood sugar as often as told. Make sure to check before and after exercise. If you exercise longer or in a different way than you normally do, check your blood sugar more often.  Follow your sick day plan whenever you cannot eat or drink normally. Make this plan ahead of time with your doctor.  Share your diabetes management plan with people in your workplace, school, and household.  Check your urine for ketones when you are ill and as told by your doctor.  Carry a card or wear jewelry that says that you have diabetes. Contact a doctor if:  Your blood sugar level is higher than 240 mg/dL (13.3 mmol/L) for 2 days in a row.  You have problems keeping your blood sugar in your target range.  High blood sugar happens often for you. Get help right away if:  You have trouble breathing.  You have a change in how you think, feel, or act (mental status).  You feel sick to your stomach (nauseous), and that feeling does not go away.  You cannot stop throwing up (vomiting). These symptoms may be an   emergency. Do not wait to see if the symptoms will go away. Get medical help right away. Call your local emergency services (911 in the [place] Do not drive yourself to the hospital.  Summary  Hyperglycemia is when the sugar (glucose) level in your blood is too high.  High blood sugar can happen to people who do or do not have diabetes.  Make sure you drink enough fluids, eat healthy foods, and exercise regularly.  Contact your doctor if you have problems keeping your blood sugar in your target range. This information is not intended to replace advice given  to you by your health care provider. Make sure you discuss any questions you have with your health care provider. Document Released: 05/04/2009 Document Revised: 03/24/2016 Document Reviewed: 03/24/2016 Elsevier Interactive Patient Education  2017 [employer]  

## 2018-06-29 NOTE — Progress Notes (Signed)
PATIENT CARE CENTER NOTE  Diagnosis: Hyperglycemia    Provider: Raliegh Ip, FNP   Procedure: 1 liter fluid bolus and CBG check   Note: Patient's CBG initially 583. Patient received 1 liter bolus of 0.9% Sodium Chloride IV. Tolerated well with no adverse reaction. CBG rechecked after bolus and is now 202. Provider notified. Discharge instructions given. Patient alert, oriented and ambulatory at discharge.

## 2018-06-30 LAB — COMPREHENSIVE METABOLIC PANEL
ALT: 32 IU/L (ref 0–44)
AST: 22 IU/L (ref 0–40)
Albumin/Globulin Ratio: 1.5 (ref 1.2–2.2)
Albumin: 4.4 g/dL (ref 3.5–5.5)
Alkaline Phosphatase: 164 IU/L — ABNORMAL HIGH (ref 39–117)
BUN/Creatinine Ratio: 11 (ref 9–20)
BUN: 16 mg/dL (ref 6–24)
Bilirubin Total: 0.3 mg/dL (ref 0.0–1.2)
CO2: 22 mmol/L (ref 20–29)
Calcium: 9.7 mg/dL (ref 8.7–10.2)
Chloride: 91 mmol/L — ABNORMAL LOW (ref 96–106)
Creatinine, Ser: 1.46 mg/dL — ABNORMAL HIGH (ref 0.76–1.27)
GFR calc Af Amer: 67 mL/min/{1.73_m2} (ref >59)
GFR calc non Af Amer: 58 mL/min/{1.73_m2} — ABNORMAL LOW (ref >59)
Globulin, Total: 2.9 g/dL (ref 1.5–4.5)
Glucose: 615 mg/dL (ref 65–99)
Potassium: 4.6 mmol/L (ref 3.5–5.2)
Sodium: 129 mmol/L — ABNORMAL LOW (ref 134–144)
Total Protein: 7.3 g/dL (ref 6.0–8.5)

## 2018-06-30 LAB — CBC WITH DIFFERENTIAL/PLATELET
Basophils Absolute: 0.1 10*3/uL (ref 0.0–0.2)
Basos: 1 %
EOS (ABSOLUTE): 0.2 10*3/uL (ref 0.0–0.4)
Eos: 3 %
Hematocrit: 45.6 % (ref 37.5–51.0)
Hemoglobin: 14.8 g/dL (ref 13.0–17.7)
Immature Grans (Abs): 0 10*3/uL (ref 0.0–0.1)
Immature Granulocytes: 0 %
Lymphocytes Absolute: 2.2 10*3/uL (ref 0.7–3.1)
Lymphs: 34 %
MCH: 26.1 pg — ABNORMAL LOW (ref 26.6–33.0)
MCHC: 32.5 g/dL (ref 31.5–35.7)
MCV: 80 fL (ref 79–97)
Monocytes Absolute: 0.6 10*3/uL (ref 0.1–0.9)
Monocytes: 9 %
Neutrophils Absolute: 3.5 10*3/uL (ref 1.4–7.0)
Neutrophils: 53 %
Platelets: 264 10*3/uL (ref 150–450)
RBC: 5.68 x10E6/uL (ref 4.14–5.80)
RDW: 11.8 % — ABNORMAL LOW (ref 12.3–15.4)
WBC: 6.6 10*3/uL (ref 3.4–10.8)

## 2018-06-30 LAB — TSH: TSH: 1.95 u[IU]/mL (ref 0.450–4.500)

## 2018-06-30 LAB — PSA: Prostate Specific Ag, Serum: 1 ng/mL (ref 0.0–4.0)

## 2018-07-06 ENCOUNTER — Ambulatory Visit: Payer: Self-pay | Admitting: Family Medicine

## 2019-04-11 DIAGNOSIS — L910 Hypertrophic scar: Secondary | ICD-10-CM | POA: Diagnosis not present

## 2019-04-11 DIAGNOSIS — D225 Melanocytic nevi of trunk: Secondary | ICD-10-CM | POA: Diagnosis not present

## 2019-05-12 DIAGNOSIS — E119 Type 2 diabetes mellitus without complications: Secondary | ICD-10-CM | POA: Diagnosis not present

## 2019-05-12 DIAGNOSIS — H5212 Myopia, left eye: Secondary | ICD-10-CM | POA: Diagnosis not present

## 2019-11-23 IMAGING — CT CT ABD-PELV W/ CM
2 of 4 series · 16 of 46 positions shown, 18 images · IV contrast (Omni 300)
Comparison: None.

CLINICAL DATA: 42-year-old male with a history constipation

EXAM:
CT ABDOMEN AND PELVIS WITH CONTRAST
TECHNIQUE: Multidetector CT imaging of the abdomen and pelvis was performed
using the standard protocol following bolus administration of
intravenous contrast.
CONTRAST:  100mL HAES8I-GPP IOPAMIDOL (HAES8I-GPP) INJECTION 61%

[Series 3: a/p w/ 5mm · axial · 0.98mm/px · z∈[+668,+1178]mm · 13 of 112 slices shown, 15 images]
[im 5/112  soft-tissue]
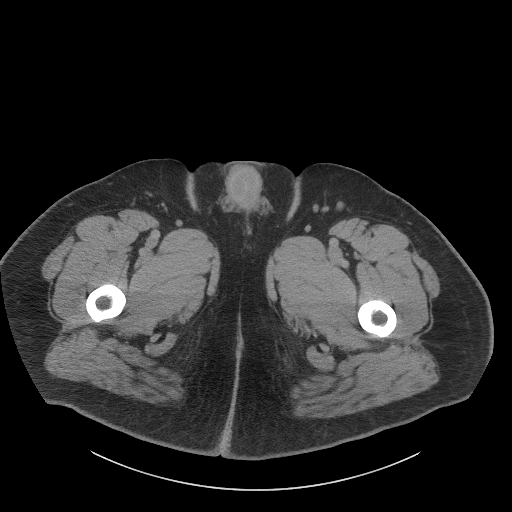
[im 5/112  bone]
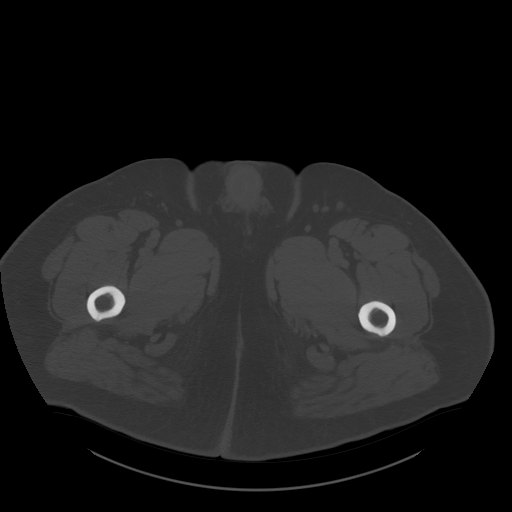
[im 15/112  soft-tissue]
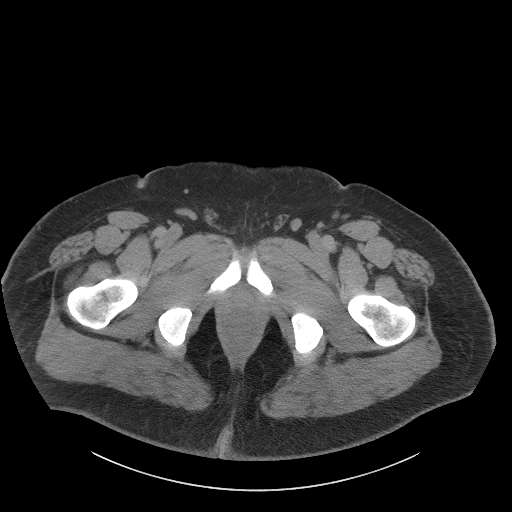
[im 25/112  soft-tissue]
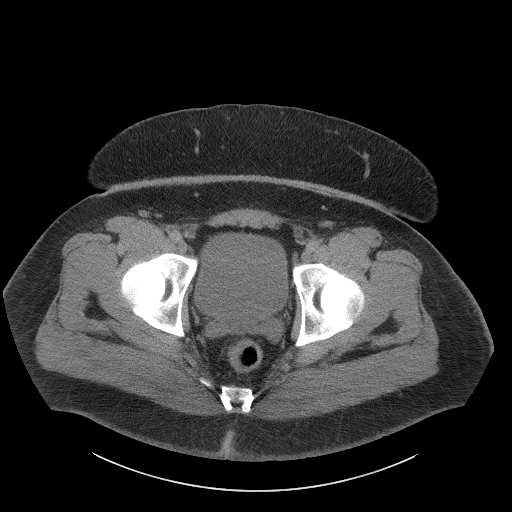
[im 29/112  soft-tissue]
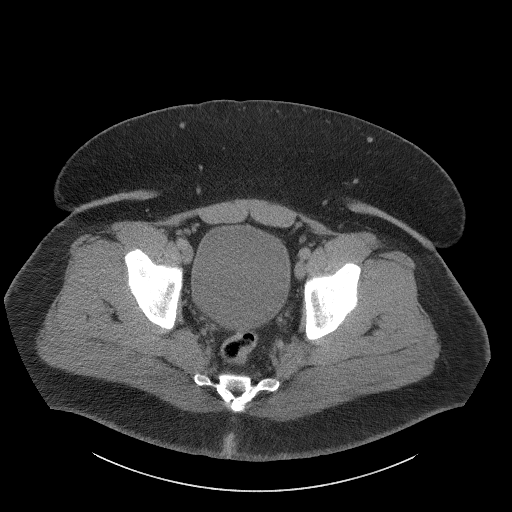
[im 39/112  soft-tissue]
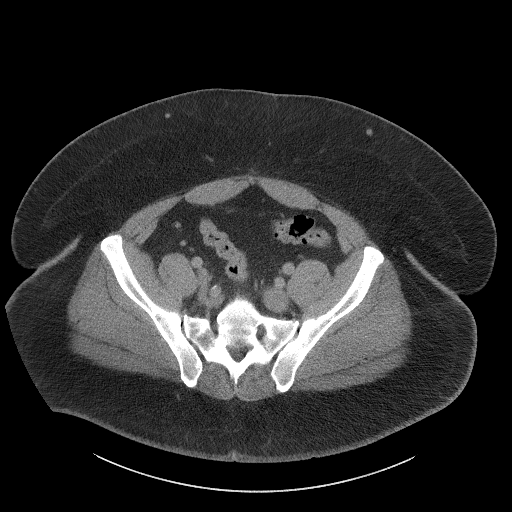
[im 49/112  soft-tissue]
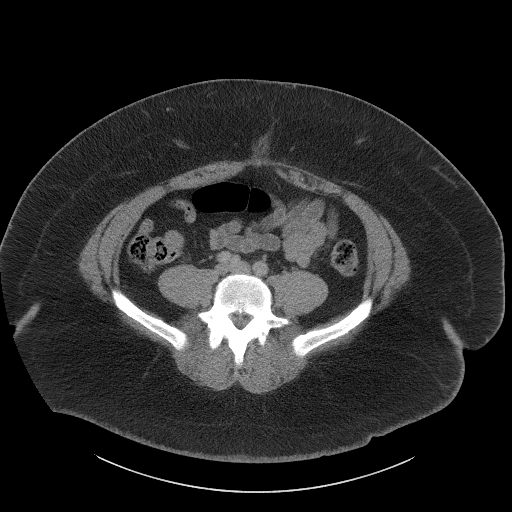
[im 58/112  soft-tissue]
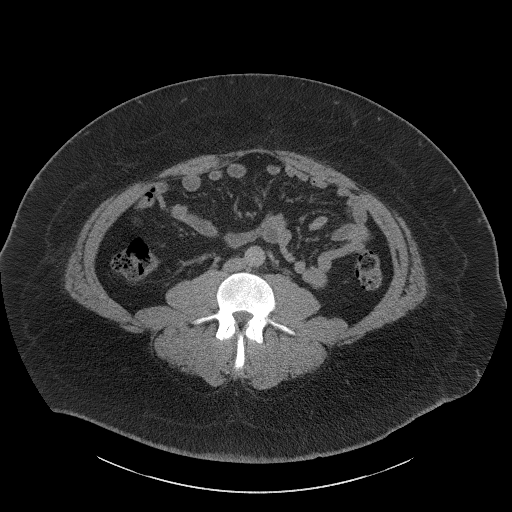
[im 63/112  soft-tissue]
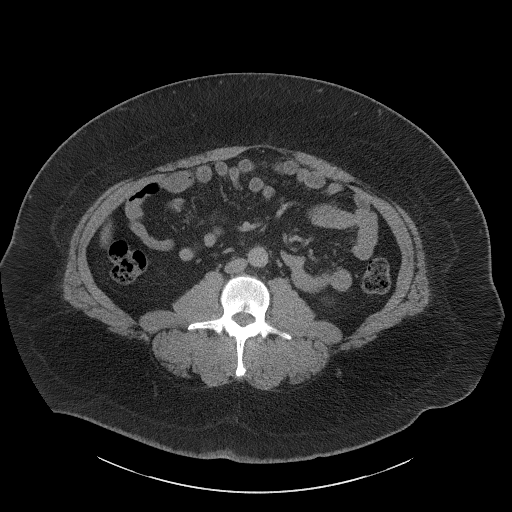
[im 73/112  soft-tissue]
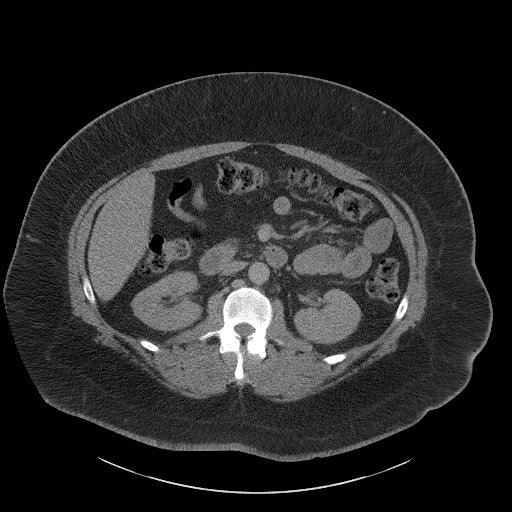
[im 73/112  bone]
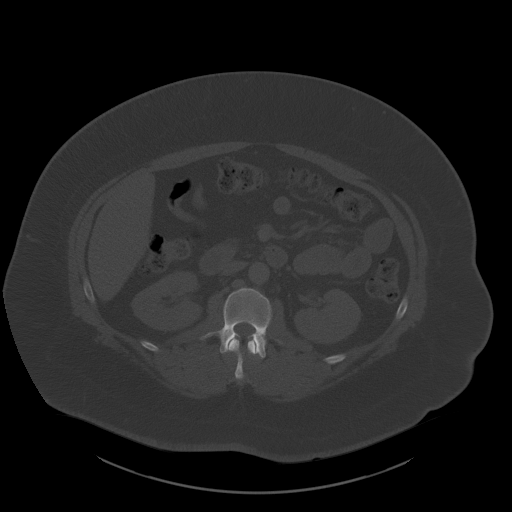
[im 83/112  soft-tissue]
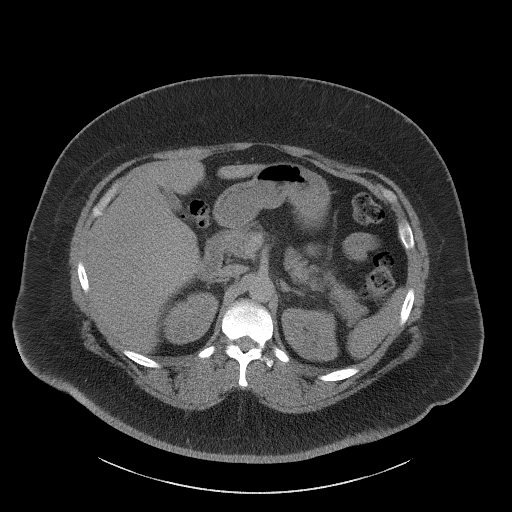
[im 87/112  soft-tissue]
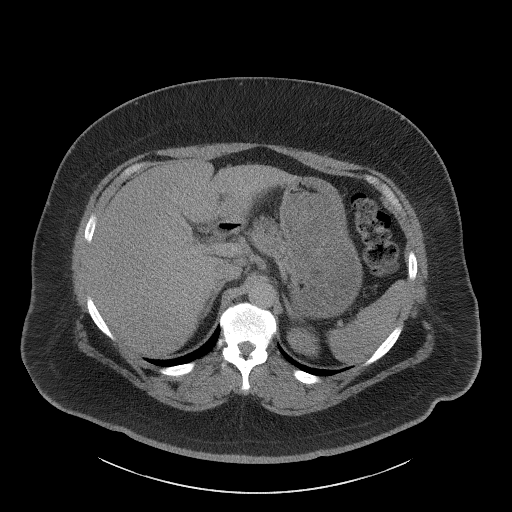
[im 97/112  soft-tissue]
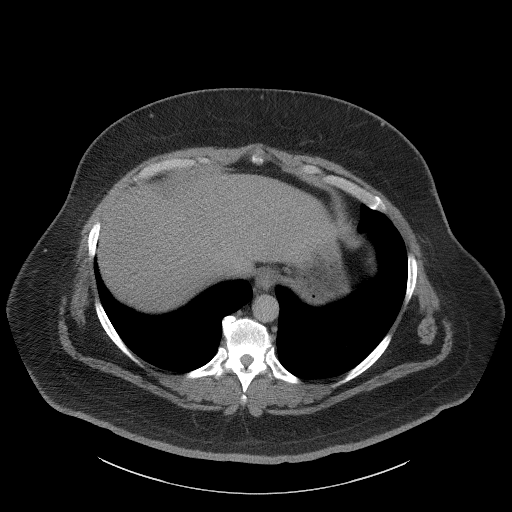
[im 107/112  soft-tissue]
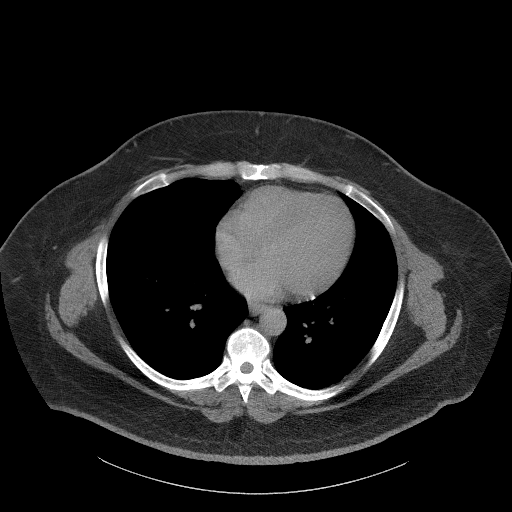

[Series 6: a/p w/ cor · coronal · 1.27mm/px · 3 of 151 slices shown]
[im 51/151  soft-tissue]
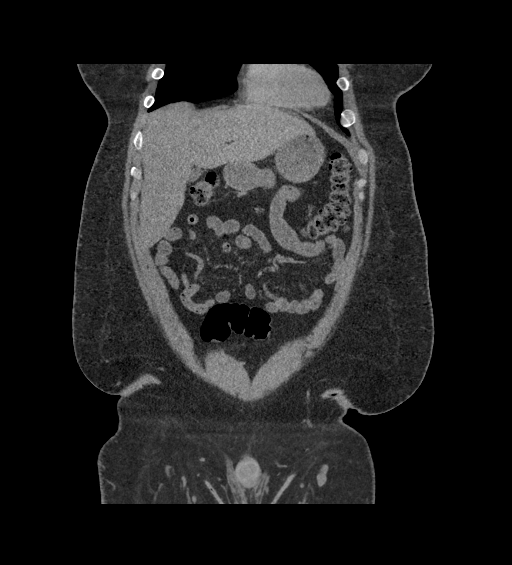
[im 67/151  soft-tissue]
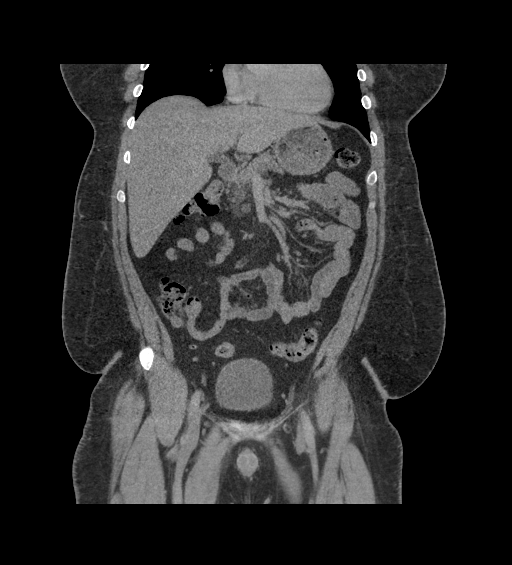
[im 84/151  soft-tissue]
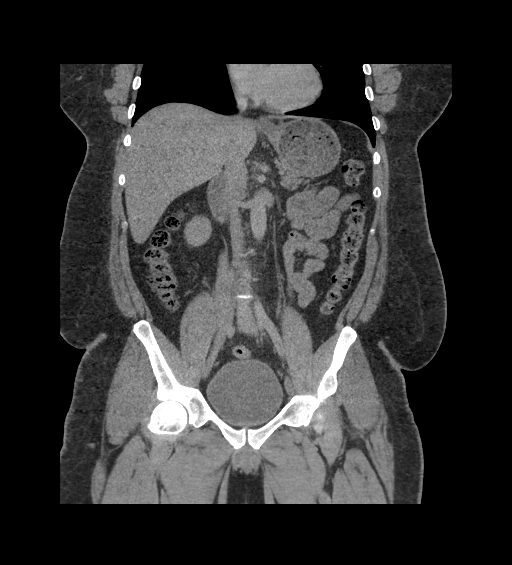

[16 of 46 positions shown; findings below may reference images not displayed]

FINDINGS: Lower chest: No acute abnormality.

Hepatobiliary: Cranial caudal span of the right liver measures
greater than 20 cm. Otherwise unremarkable liver. Contracted
gallbladder.

Pancreas: Unremarkable

Spleen: Unremarkable

Adrenals/Urinary Tract: Unremarkable adrenal glands.

Right kidney with no hydronephrosis or nephrolithiasis. Low-density
cystic lesion in the medial cortex, incompletely characterized
measuring less than 2 cm.

Left kidney unremarkable with no hydronephrosis or nephrolithiasis.

Unremarkable course the bilateral ureters.

Unremarkable urinary bladder.

Stomach/Bowel: Unremarkable stomach. Unremarkable small bowel. No
abnormal distention. No transition point. Normal appendix. Mild
stool burden. No transition point or dilated colon. No significant
diverticular disease. No inflammatory changes or focal wall
thickening.

Vascular/Lymphatic: No vascular calcifications. No aneurysm. No
adenopathy

Reproductive: Unremarkable pelvic structures.

Other: Small fat containing umbilical hernia

Musculoskeletal: No acute displaced fracture. Mild degenerative
changes.
IMPRESSION: No acute CT finding.

No evidence of bowel obstruction, with moderate stool burden.

## 2019-12-20 DIAGNOSIS — E785 Hyperlipidemia, unspecified: Secondary | ICD-10-CM

## 2019-12-20 DIAGNOSIS — R739 Hyperglycemia, unspecified: Secondary | ICD-10-CM

## 2019-12-20 DIAGNOSIS — Z91148 Patient's other noncompliance with medication regimen for other reason: Secondary | ICD-10-CM

## 2019-12-20 DIAGNOSIS — Z9114 Patient's other noncompliance with medication regimen: Secondary | ICD-10-CM

## 2019-12-20 DIAGNOSIS — I160 Hypertensive urgency: Secondary | ICD-10-CM

## 2019-12-20 DIAGNOSIS — R7309 Other abnormal glucose: Secondary | ICD-10-CM

## 2019-12-20 DIAGNOSIS — E559 Vitamin D deficiency, unspecified: Secondary | ICD-10-CM

## 2019-12-20 DIAGNOSIS — R824 Acetonuria: Secondary | ICD-10-CM

## 2019-12-20 HISTORY — DX: Patient's other noncompliance with medication regimen for other reason: Z91.148

## 2019-12-20 HISTORY — DX: Acetonuria: R82.4

## 2019-12-20 HISTORY — DX: Hypertensive urgency: I16.0

## 2019-12-20 HISTORY — DX: Hyperglycemia, unspecified: R73.9

## 2019-12-20 HISTORY — DX: Patient's other noncompliance with medication regimen: Z91.14

## 2019-12-20 HISTORY — DX: Other abnormal glucose: R73.09

## 2019-12-20 HISTORY — DX: Vitamin D deficiency, unspecified: E55.9

## 2019-12-20 HISTORY — DX: Hyperlipidemia, unspecified: E78.5

## 2020-01-04 ENCOUNTER — Encounter: Payer: Self-pay | Admitting: Family Medicine

## 2020-01-04 ENCOUNTER — Ambulatory Visit (HOSPITAL_COMMUNITY)
Admission: RE | Admit: 2020-01-04 | Discharge: 2020-01-04 | Disposition: A | Discharge status: Home / Self Care | Payer: BC Managed Care – PPO | Source: Ambulatory Visit | Attending: Internal Medicine | Admitting: Internal Medicine

## 2020-01-04 ENCOUNTER — Other Ambulatory Visit: Payer: Self-pay

## 2020-01-04 ENCOUNTER — Ambulatory Visit (INDEPENDENT_AMBULATORY_CARE_PROVIDER_SITE_OTHER): Payer: BC Managed Care – PPO | Admitting: Family Medicine

## 2020-01-04 VITALS — BP 165/111 | HR 109 | Temp 99.0°F | Ht 73.0 in | Wt 268.1 lb

## 2020-01-04 DIAGNOSIS — I10 Essential (primary) hypertension: Secondary | ICD-10-CM | POA: Insufficient documentation

## 2020-01-04 DIAGNOSIS — Z9114 Patient's other noncompliance with medication regimen: Secondary | ICD-10-CM | POA: Insufficient documentation

## 2020-01-04 DIAGNOSIS — Z7689 Persons encountering health services in other specified circumstances: Secondary | ICD-10-CM

## 2020-01-04 DIAGNOSIS — E118 Type 2 diabetes mellitus with unspecified complications: Secondary | ICD-10-CM | POA: Insufficient documentation

## 2020-01-04 DIAGNOSIS — Z09 Encounter for follow-up examination after completed treatment for conditions other than malignant neoplasm: Secondary | ICD-10-CM

## 2020-01-04 DIAGNOSIS — Z91148 Patient's other noncompliance with medication regimen for other reason: Secondary | ICD-10-CM | POA: Insufficient documentation

## 2020-01-04 DIAGNOSIS — I160 Hypertensive urgency: Secondary | ICD-10-CM | POA: Diagnosis not present

## 2020-01-04 DIAGNOSIS — E1165 Type 2 diabetes mellitus with hyperglycemia: Secondary | ICD-10-CM | POA: Diagnosis not present

## 2020-01-04 DIAGNOSIS — R824 Acetonuria: Secondary | ICD-10-CM | POA: Insufficient documentation

## 2020-01-04 DIAGNOSIS — R7309 Other abnormal glucose: Secondary | ICD-10-CM

## 2020-01-04 DIAGNOSIS — R739 Hyperglycemia, unspecified: Secondary | ICD-10-CM | POA: Insufficient documentation

## 2020-01-04 LAB — POCT URINALYSIS DIPSTICK
Bilirubin, UA: NEGATIVE
Glucose, UA: POSITIVE — AB
Leukocytes, UA: NEGATIVE
Nitrite, UA: NEGATIVE
Protein, UA: POSITIVE — AB
Spec Grav, UA: 1.025 (ref 1.010–1.025)
Urobilinogen, UA: 0.2 E.U./dL
pH, UA: 5 (ref 5.0–8.0)

## 2020-01-04 LAB — CBC WITH DIFFERENTIAL/PLATELET
Abs Immature Granulocytes: 0.01 10*3/uL (ref 0.00–0.07)
Basophils Absolute: 0.1 10*3/uL (ref 0.0–0.1)
Basophils Relative: 1 %
Eosinophils Absolute: 0.1 10*3/uL (ref 0.0–0.5)
Eosinophils Relative: 2 %
HCT: 48.8 % (ref 39.0–52.0)
Hemoglobin: 15.7 g/dL (ref 13.0–17.0)
Immature Granulocytes: 0 %
Lymphocytes Relative: 38 %
Lymphs Abs: 2.1 10*3/uL (ref 0.7–4.0)
MCH: 25.5 pg — ABNORMAL LOW (ref 26.0–34.0)
MCHC: 32.2 g/dL (ref 30.0–36.0)
MCV: 79.3 fL — ABNORMAL LOW (ref 80.0–100.0)
Monocytes Absolute: 0.4 10*3/uL (ref 0.1–1.0)
Monocytes Relative: 7 %
Neutro Abs: 2.9 10*3/uL (ref 1.7–7.7)
Neutrophils Relative %: 52 %
Platelets: 276 10*3/uL (ref 150–400)
RBC: 6.15 MIL/uL — ABNORMAL HIGH (ref 4.22–5.81)
RDW: 12.2 % (ref 11.5–15.5)
WBC: 5.5 10*3/uL (ref 4.0–10.5)
nRBC: 0 % (ref 0.0–0.2)

## 2020-01-04 LAB — COMPREHENSIVE METABOLIC PANEL
ALT: 17 U/L (ref 0–44)
AST: 13 U/L — ABNORMAL LOW (ref 15–41)
Albumin: 4.1 g/dL (ref 3.5–5.0)
Alkaline Phosphatase: 82 U/L (ref 38–126)
Anion gap: 9 (ref 5–15)
BUN: 13 mg/dL (ref 6–20)
CO2: 25 mmol/L (ref 22–32)
Calcium: 8.9 mg/dL (ref 8.9–10.3)
Chloride: 102 mmol/L (ref 98–111)
Creatinine, Ser: 0.9 mg/dL (ref 0.61–1.24)
GFR calc Af Amer: >60 mL/min (ref >60)
GFR calc non Af Amer: >60 mL/min (ref >60)
Glucose, Bld: 310 mg/dL — ABNORMAL HIGH (ref 70–99)
Potassium: 3.6 mmol/L (ref 3.5–5.1)
Sodium: 136 mmol/L (ref 135–145)
Total Bilirubin: 0.7 mg/dL (ref 0.3–1.2)
Total Protein: 7.6 g/dL (ref 6.5–8.1)

## 2020-01-04 LAB — POCT GLYCOSYLATED HEMOGLOBIN (HGB A1C)

## 2020-01-04 LAB — LIPID PANEL
Cholesterol: 215 mg/dL — ABNORMAL HIGH (ref 0–200)
HDL: 48 mg/dL (ref >40)
LDL Cholesterol: 159 mg/dL — ABNORMAL HIGH (ref 0–99)
Total CHOL/HDL Ratio: 4.5 RATIO
Triglycerides: 38 mg/dL (ref <150)
VLDL: 8 mg/dL (ref 0–40)

## 2020-01-04 LAB — VITAMIN D 25 HYDROXY (VIT D DEFICIENCY, FRACTURES): Vit D, 25-Hydroxy: 11.05 ng/mL — ABNORMAL LOW (ref 30–100)

## 2020-01-04 LAB — VITAMIN B12: Vitamin B-12: 366 pg/mL (ref 180–914)

## 2020-01-04 LAB — TSH: TSH: 0.7 u[IU]/mL (ref 0.350–4.500)

## 2020-01-04 LAB — GLUCOSE, POCT (MANUAL RESULT ENTRY): POC Glucose: 326 mg/dl — AB (ref 70–99)

## 2020-01-04 MED ORDER — INSULIN LISPRO 100 UNIT/ML ~~LOC~~ SOLN: 20.0000 [IU] | Freq: Once | SUBCUTANEOUS | Status: DC

## 2020-01-04 MED ORDER — CLONIDINE HCL 0.1 MG PO TABS
0.1000 mg | ORAL_TABLET | Freq: Once | ORAL | Status: AC
  Administered 2020-01-04: 0.1 mg via ORAL

## 2020-01-04 MED ORDER — METFORMIN HCL 1000 MG PO TABS: 1000.0000 mg | ORAL_TABLET | Freq: Two times a day (BID) | ORAL | 3 refills | Status: DC

## 2020-01-04 MED ORDER — SODIUM CHLORIDE 0.9 % IV SOLN: Freq: Once | INTRAVENOUS | Status: AC

## 2020-01-04 MED ORDER — AMLODIPINE BESYLATE 10 MG PO TABS: 10.0000 mg | ORAL_TABLET | Freq: Every day | ORAL | 3 refills | Status: DC

## 2020-01-04 MED ORDER — INSULIN LISPRO PROT & LISPRO (50-50 MIX) 100 UNIT/ML ~~LOC~~ SUSP: 20.0000 [IU] | Freq: Once | SUBCUTANEOUS | Status: DC

## 2020-01-04 MED ORDER — INSULIN GLARGINE 100 UNITS/ML SOLOSTAR PEN: 50.0000 [IU] | PEN_INJECTOR | Freq: Every day | SUBCUTANEOUS | 11 refills | Status: DC

## 2020-01-04 MED ORDER — CARVEDILOL 12.5 MG PO TABS: 12.5000 mg | ORAL_TABLET | Freq: Two times a day (BID) | ORAL | 3 refills | Status: DC

## 2020-01-04 MED ORDER — GLIPIZIDE 10 MG PO TABS: 10.0000 mg | ORAL_TABLET | Freq: Two times a day (BID) | ORAL | 3 refills | Status: DC

## 2020-01-04 NOTE — Progress Notes (Signed)
Patient [facility]   New Patient--Hospital Follow Up--Establish Care  Subjective:  Patient ID: Rodney Kane, male    DOB: 09/30/1975  Age: 44 y.o. MRN: [id number]  CC:  Chief Complaint  Patient presents with  . Follow-up    lasst seen 2019; sharp pain in both feet, toes get freezing cold.     HPI Rodney Kane is a 44 year old male who presents for Hospital Follow Up and to Re-Establish today.     Patient Active Problem List   Diagnosis Date Noted  . NICM (nonischemic cardiomyopathy) ([place]) 01/16/2017  . 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 Acute combined systolic and diastolic heart failure ([place])   . Elevated troponin   . CHF (congestive heart failure) ([place]) 01/02/2017  . 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 Diabetes ([place]) 01/02/2017   Current Status: Since his last office visit, he has been lost to follow up with our office. He was last seen in our office on 06/2018. Today, her reports that he is doing well with no complaints. He states that he has not been taking his hypertension or diabetic medications for and extended amount of time now. He denies visual changes, chest pain, cough, shortness of breath, heart palpitations, and falls. He has occasional headaches and dizziness with position changes. Denies severe headaches, confusion, seizures, double vision, and blurred vision, nausea and vomiting. He denies fatigue, frequent urination, blurred vision, excessive hunger, excessive thirst, weight gain, weight loss, and poor wound healing. He continues to check his feet regularly. He denies fevers, chills, recent infections, weight loss, and night sweats. Denies GI problems such as diarrhea, and constipation. He has no reports of blood in stools, dysuria and hematuria. No depression or anxiety reported today. He denies suicidal ideations, homicidal ideations, or auditory hallucinations. He is taking all medications as  prescribed. He denies pain today.   Past Medical History:  Diagnosis Date  . Diabetes mellitus without complication ([place])   . Hypertension     Past Surgical History:  Procedure Laterality Date  . left knee surgery    . RIGHT/LEFT HEART CATH AND CORONARY ANGIOGRAPHY N/A 01/05/2017   Procedure: Right/Left Heart Cath and Coronary Angiography;  Surgeon: Leonie Man, MD;  Location: [facility];  Service: Cardiovascular;  Laterality: N/A;    Family History  Problem Relation Age of Onset  . Multiple sclerosis Mother   . Diabetic kidney disease Mother   . Hypertension Mother     Social History   Socioeconomic History  . Marital status: Divorced    Spouse name: Not on file  . Number of children: 3  . Years of education: Not on file  . Highest education level: Not on file  Occupational History  . Not on file  Tobacco Use  . Smoking status: Never Smoker  . Smokeless tobacco: Never Used  Vaping Use  . Vaping Use: Never used  Substance and Sexual Activity  . Alcohol use: No  . Drug use: No  . Sexual activity: Yes  Other Topics Concern  . Not on file  Social History Narrative  . Not on file   Social Determinants of Health   Financial Resource Strain:   . Difficulty of Paying Living Expenses:   Food Insecurity:   . Worried About Charity fundraiser in the Last Year:   . [place] in the Last Year:   Transportation Needs:   . Lack of  Transportation (Medical):   Marland Kitchen Lack of Transportation (Non-Medical):   Physical Activity:   . Days of Exercise per Week:   . Minutes of Exercise per Session:   Stress:   . Feeling of Stress :   Social Connections:   . Frequency of Communication with Friends and Family:   . Frequency of Social Gatherings with Friends and Family:   . Attends Religious Services:   . Active Member of Clubs or Organizations:   . Attends Archivist Meetings:   Marland Kitchen Marital Status:   Intimate Partner Violence:   . Fear of Current or  Ex-Partner:   . Emotionally Abused:   Marland Kitchen Physically Abused:   . Sexually Abused:     Outpatient Medications Prior to Visit  Medication Sig Dispense Refill  . aspirin 81 MG EC tablet Take 1 tablet (81 mg total) by mouth daily. (Patient not taking: Reported on 06/24/2018) 30 tablet 0  . blood glucose meter kit and supplies Dispense based on patient and insurance preference. Use up to four times daily for blood glucose readings greater than 125. (Patient not taking: Reported on 01/04/2020) 1 each 0  . insulin aspart (NOVOLOG) 100 UNIT/ML injection Inject 15 Units into the skin 3 (three) times daily with meals. (Patient not taking: Reported on 01/04/2020) 10 mL 11  . losartan (COZAAR) 25 MG tablet Take 1 tablet (25 mg total) by mouth daily. 30 tablet 3  . omeprazole (PRILOSEC) 40 MG capsule Take 1 capsule (40 mg total) by mouth daily. (Patient not taking: Reported on 01/04/2020) 30 capsule 3  . potassium chloride SA (K-DUR,KLOR-CON) 20 MEQ tablet Take 1 tablet (20 mEq total) by mouth daily. (Patient not taking: Reported on 06/24/2018) 30 tablet 3  . carvedilol (COREG) 12.5 MG tablet Take 1 tablet (12.5 mg total) by mouth 2 (two) times daily with a meal. (Patient not taking: Reported on 01/04/2020) 60 tablet 3  . furosemide (LASIX) 20 MG tablet Take 2 tablets (40 mg total) by mouth daily. (Patient not taking: Reported on 01/04/2020) 30 tablet 3  . glipiZIDE (GLUCOTROL) 10 MG tablet Take 1 tablet (10 mg total) by mouth 2 (two) times daily before a meal. (Patient not taking: Reported on 01/04/2020) 60 tablet 3  . hydrALAZINE (APRESOLINE) 50 MG tablet Take 1 tablet (50 mg total) by mouth 3 (three) times daily. (Patient not taking: Reported on 01/04/2020) 90 tablet 3  . insulin glargine (LANTUS) 100 unit/mL SOPN Inject 0.35 mLs (35 Units total) into the skin at bedtime. 10.5 mL 3  . metFORMIN (GLUCOPHAGE) 1000 MG tablet Take 1 tablet (1,000 mg total) by mouth 2 (two) times daily with a meal. (Patient not taking:  Reported on 01/04/2020) 180 tablet 3   No facility-administered medications prior to visit.    No Known Allergies  ROS Review of Systems  Constitutional: Negative.   HENT: Negative.   Eyes: Positive for visual disturbance (blurrry vision).  Respiratory: Negative.   Cardiovascular: Negative.   Gastrointestinal: Positive for abdominal distention.  Endocrine: Negative.   Genitourinary: Negative.   Musculoskeletal: Positive for arthralgias (generalized joint pain).  Skin: Negative.   Allergic/Immunologic: Negative.   Neurological: Positive for dizziness (occasional ) and headaches (occasional ).  Hematological: Negative.   Psychiatric/Behavioral: Negative.       Objective:    Physical Exam Vitals and nursing note reviewed.  Constitutional:      Appearance: Normal appearance.  HENT:     Head: Normocephalic and atraumatic.     Nose: Nose  normal.     Mouth/Throat:     Mouth: Mucous membranes are moist.  Cardiovascular:     Rate and Rhythm: Normal rate and regular rhythm.     Pulses: Normal pulses.     Heart sounds: Normal heart sounds.  Pulmonary:     Effort: Pulmonary effort is normal.     Breath sounds: Normal breath sounds.  Abdominal:     General: Abdomen is flat. Bowel sounds are normal. There is distension.     Palpations: Abdomen is soft.  Musculoskeletal:        General: Normal range of motion.     Cervical back: Normal range of motion and neck supple.  Skin:    General: Skin is warm and dry.  Neurological:     General: No focal deficit present.     Mental Status: He is alert and oriented to person, place, and time.  Psychiatric:        Mood and Affect: Mood normal.        Behavior: Behavior normal.        Thought Content: Thought content normal.        Judgment: Judgment normal.     BP (!) 165/111 (BP Location: Left Arm, Patient Position: Sitting, Cuff Size: Large)   Pulse (!) 109   Temp 99 F (37.2 C)   Ht _0  (1.854 m)   Wt 268 lb 0.8 oz (121.6  kg)   SpO2 99%   BMI 35.36 kg/m  Wt Readings from Last 3 Encounters:  01/04/20 268 lb 0.8 oz (121.6 kg)  06/29/18 (!) 351 lb (159.2 kg)  06/24/18 (!) 360 lb 0.2 oz (163.3 kg)     Health Maintenance Due  Topic Date Due  . Hepatitis C Screening  Never done  . PNEUMOCOCCAL POLYSACCHARIDE VACCINE AGE 50-64 HIGH RISK  Never done  . OPHTHALMOLOGY EXAM  Never done  . COVID-19 Vaccine (1) Never done  . HEMOGLOBIN A1C  04/09/2018  . FOOT EXAM  06/30/2019    There are no preventive care reminders to display for this patient.  Lab Results  Component Value Date   TSH 0.700 01/04/2020   Lab Results  Component Value Date   WBC 5.5 01/04/2020   HGB 15.7 01/04/2020   HCT 48.8 01/04/2020   MCV 79.3 (L) 01/04/2020   PLT 276 01/04/2020   Lab Results  Component Value Date   NA 136 01/04/2020   K 3.6 01/04/2020   CO2 25 01/04/2020   GLUCOSE 310 (H) 01/04/2020   BUN 13 01/04/2020   CREATININE 0.90 01/04/2020   BILITOT 0.7 01/04/2020   ALKPHOS 82 01/04/2020   AST 13 (L) 01/04/2020   ALT 17 01/04/2020   PROT 7.6 01/04/2020   ALBUMIN 4.1 01/04/2020   CALCIUM 8.9 01/04/2020   ANIONGAP 9 01/04/2020   Lab Results  Component Value Date   CHOL 215 (H) 01/04/2020   Lab Results  Component Value Date   HDL 48 01/04/2020   Lab Results  Component Value Date   LDLCALC 159 (H) 01/04/2020   Lab Results  Component Value Date   TRIG 38 01/04/2020   Lab Results  Component Value Date   CHOLHDL 4.5 01/04/2020   Lab Results  Component Value Date   HGBA1C  01/04/2020     Comment:     Error #106 above 15%      Assessment & Plan:   1. Hospital discharge follow-up  2. Encounter to establish care  3. Noncompliance  with medication regimen  4. Hypertensive urgency Clonidine 0.2 mg given to patient in office. He will receive 1 liter of [facility] today.   5. Type 2 diabetes mellitus with complication, without long-term current use of insulin ([place]) He will continue  medication as prescribed, to decrease foods/beverages high in sugars and carbs and follow Heart Healthy or DASH diet. Increase physical activity to at least 30 minutes cardio exercise daily.  - Glucose (CBG) - HgB A1c - Urinalysis Dipstick - glipiZIDE (GLUCOTROL) 10 MG tablet; Take 1 tablet (10 mg total) by mouth 2 (two) times daily before a meal.  Dispense: 180 tablet; Refill: 3 - insulin glargine (LANTUS) 100 unit/mL SOPN; Inject 0.5 mLs (50 Units total) into the skin at bedtime.  Dispense: 15 mL; Refill: 11 - metFORMIN (GLUCOPHAGE) 1000 MG tablet; Take 1 tablet (1,000 mg total) by mouth 2 (two) times daily with a meal.  Dispense: 180 tablet; Refill: 3  6. Hemoglobin A1C greater than 9%, indicating poor diabetic control Hgb A1c not readable today, indicating poor diabetic control.  - insulin lispro (HUMALOG) injection 20 Units - glipiZIDE (GLUCOTROL) 10 MG tablet; Take 1 tablet (10 mg total) by mouth 2 (two) times daily before a meal.  Dispense: 180 tablet; Refill: 3 - insulin glargine (LANTUS) 100 unit/mL SOPN; Inject 0.5 mLs (50 Units total) into the skin at bedtime.  Dispense: 15 mL; Refill: 11 - metFORMIN (GLUCOPHAGE) 1000 MG tablet; Take 1 tablet (1,000 mg total) by mouth 2 (two) times daily with a meal.  Dispense: 180 tablet; Refill: 3  7. Hyperglycemia - glipiZIDE (GLUCOTROL) 10 MG tablet; Take 1 tablet (10 mg total) by mouth 2 (two) times daily before a meal.  Dispense: 180 tablet; Refill: 3 - insulin glargine (LANTUS) 100 unit/mL SOPN; Inject 0.5 mLs (50 Units total) into the skin at bedtime.  Dispense: 15 mL; Refill: 11 - metFORMIN (GLUCOPHAGE) 1000 MG tablet; Take 1 tablet (1,000 mg total) by mouth 2 (two) times daily with a meal.  Dispense: 180 tablet; Refill: 3  8. Urine ketones  9. Essential hypertension He will  take medications as prescribed, to decrease high sodium intake, excessive alcohol intake, increase potassium intake, smoking cessation, and increase physical activity  of at least 30 minutes of cardio activity daily. He will continue to follow Heart Healthy or DASH diet. - Glucose (CBG) - HgB A1c - Urinalysis Dipstick - cloNIDine (CATAPRES) tablet 0.1 mg  10. Elevated glucose - glipiZIDE (GLUCOTROL) 10 MG tablet; Take 1 tablet (10 mg total) by mouth 2 (two) times daily before a meal.  Dispense: 180 tablet; Refill: 3 - insulin glargine (LANTUS) 100 unit/mL SOPN; Inject 0.5 mLs (50 Units total) into the skin at bedtime.  Dispense: 15 mL; Refill: 11  11. Follow up He will follow up in 2 days or reassessment of HTN and DM.   Meds ordered this encounter  Medications  . cloNIDine (CATAPRES) tablet 0.1 mg  . insulin lispro (HUMALOG) injection 20 Units  . DISCONTD: insulin lispro protamine-lispro (HUMALOG 50/50 MIX) (50-50) 100 UNIT/ML injection 20 Units  . glipiZIDE (GLUCOTROL) 10 MG tablet    Sig: Take 1 tablet (10 mg total) by mouth 2 (two) times daily before a meal.    Dispense:  180 tablet    Refill:  3  . insulin glargine (LANTUS) 100 unit/mL SOPN    Sig: Inject 0.5 mLs (50 Units total) into the skin at bedtime.    Dispense:  15 mL    Refill:  11  . metFORMIN (GLUCOPHAGE) 1000 MG tablet    Sig: Take 1 tablet (1,000 mg total) by mouth 2 (two) times daily with a meal.    Dispense:  180 tablet    Refill:  3  . amLODipine (NORVASC) 10 MG tablet    Sig: Take 1 tablet (10 mg total) by mouth daily.    Dispense:  90 tablet    Refill:  3  . carvedilol (COREG) 12.5 MG tablet    Sig: Take 1 tablet (12.5 mg total) by mouth 2 (two) times daily with a meal.    Dispense:  180 tablet    Refill:  3    Orders Placed This Encounter  Procedures  . Glucose (CBG)  . HgB A1c  . Urinalysis Dipstick    Referral Orders  No referral(s) requested today    Kathe Becton,  MSN, FNP-BC [place], [place] [address] [phone] fax  Problem List Items  Addressed This Visit    None    Visit Diagnoses    Hospital discharge follow-up    -  Primary   Encounter to establish care       Noncompliance with medication regimen       Hypertensive urgency       Relevant Medications   cloNIDine (CATAPRES) tablet 0.1 mg (Completed)   amLODipine (NORVASC) 10 MG tablet   carvedilol (COREG) 12.5 MG tablet   Type 2 diabetes mellitus with complication, without long-term current use of insulin ([facility])       Relevant Medications   insulin lispro (HUMALOG) injection 20 Units   glipiZIDE (GLUCOTROL) 10 MG tablet   insulin glargine (LANTUS) 100 unit/mL SOPN   metFORMIN (GLUCOPHAGE) 1000 MG tablet   Other Relevant Orders   Glucose (CBG) (Completed)   HgB A1c (Completed)   Urinalysis Dipstick (Completed)   Hemoglobin A1C greater than 9%, indicating poor diabetic control       Relevant Medications   insulin lispro (HUMALOG) injection 20 Units   glipiZIDE (GLUCOTROL) 10 MG tablet   insulin glargine (LANTUS) 100 unit/mL SOPN   metFORMIN (GLUCOPHAGE) 1000 MG tablet   Hyperglycemia       Relevant Medications   glipiZIDE (GLUCOTROL) 10 MG tablet   insulin glargine (LANTUS) 100 unit/mL SOPN   metFORMIN (GLUCOPHAGE) 1000 MG tablet   Urine ketones       Essential hypertension       Relevant Medications   cloNIDine (CATAPRES) tablet 0.1 mg (Completed)   amLODipine (NORVASC) 10 MG tablet   carvedilol (COREG) 12.5 MG tablet   Other Relevant Orders   Glucose (CBG) (Completed)   HgB A1c (Completed)   Urinalysis Dipstick (Completed)   Follow up       Elevated glucose       Relevant Medications   glipiZIDE (GLUCOTROL) 10 MG tablet   insulin glargine (LANTUS) 100 unit/mL SOPN      Meds ordered this encounter  Medications  . cloNIDine (CATAPRES) tablet 0.1 mg  . insulin lispro (HUMALOG) injection 20 Units  . DISCONTD: insulin lispro protamine-lispro (HUMALOG 50/50 MIX) (50-50) 100 UNIT/ML injection 20 Units  . glipiZIDE (GLUCOTROL) 10 MG tablet    Sig:  Take 1 tablet (10 mg total) by mouth 2 (two) times daily before a meal.    Dispense:  180 tablet    Refill:  3  . insulin glargine (LANTUS) 100 unit/mL SOPN  Sig: Inject 0.5 mLs (50 Units total) into the skin at bedtime.    Dispense:  15 mL    Refill:  11  . metFORMIN (GLUCOPHAGE) 1000 MG tablet    Sig: Take 1 tablet (1,000 mg total) by mouth 2 (two) times daily with a meal.    Dispense:  180 tablet    Refill:  3  . amLODipine (NORVASC) 10 MG tablet    Sig: Take 1 tablet (10 mg total) by mouth daily.    Dispense:  90 tablet    Refill:  3  . carvedilol (COREG) 12.5 MG tablet    Sig: Take 1 tablet (12.5 mg total) by mouth 2 (two) times daily with a meal.    Dispense:  180 tablet    Refill:  3    Follow-up: No follow-ups on file.    Azzie Glatter, FNP

## 2020-01-04 NOTE — Progress Notes (Signed)
PATIENT CARE CENTER NOTE  Diagnosis: Hypertension and Hyperglycemia   Provider: Raliegh Ip, FNP   Procedure: Fluid bolus IV    Note: Patient received a fluid bolus of 0.9% Sodium Chloride. Pre infusion BP 147/96 and CBG 326. Patient tolerated bolus well with no adverse reaction. Post-infusion BP 129/94 and CBG 232. Discharge instructions given. Patient alert, oriented and ambulatory at discharge.

## 2020-01-04 NOTE — Discharge Instructions (Signed)
Hyperglycemia Hyperglycemia is when the sugar (glucose) level in your blood is too high. It may not cause symptoms. If you do have symptoms, they may include warning signs, such as:  Feeling more thirsty than normal.  Hunger.  Feeling tired.  Needing to pee (urinate) more than normal.  Blurry eyesight (vision). You may get other symptoms as it gets worse, such as:  Dry mouth.  Not being hungry (loss of appetite).  Fruity-smelling breath.  Weakness.  Weight gain or loss that is not planned. Weight loss may be fast.  A tingling or numb feeling in your hands or feet.  Headache.  Skin that does not bounce back quickly when it is lightly pinched and released (poor skin turgor).  Pain in your belly (abdomen).  Cuts or bruises that heal slowly. High blood sugar can happen to people who do or do not have diabetes. High blood sugar can happen slowly or quickly, and it can be an emergency. Follow these instructions at home: General instructions  Take over-the-counter and prescription medicines only as told by your doctor.  Do not use products that contain nicotine or tobacco, such as cigarettes and e-cigarettes. If you need help quitting, ask your doctor.  Limit alcohol intake to no more than 1 drink per day for nonpregnant women and 2 drinks per day for men. One drink equals 12 oz of beer, 5 oz of wine, or 1 oz of hard liquor.  Manage stress. If you need help with this, ask your doctor.  Keep all follow-up visits as told by your doctor. This is important. Eating and drinking   Stay at a healthy weight.  Exercise regularly, as told by your doctor.  Drink enough fluid, especially when you: ? Exercise. ? Get sick. ? Are in hot temperatures.  Eat healthy foods, such as: ? Low-fat (lean) proteins. ? Complex carbs (complex carbohydrates), such as whole wheat bread or brown rice. ? Fresh fruits and vegetables. ? Low-fat dairy products. ? Healthy fats.  Drink enough  fluid to keep your pee (urine) clear or pale yellow. If you have diabetes:   Make sure you know the symptoms of hyperglycemia.  Follow your diabetes management plan, as told by your doctor. Make sure you: ? Take insulin and medicines as told. ? Follow your exercise plan. ? Follow your meal plan. Eat on time. Do not skip meals. ? Check your blood sugar as often as told. Make sure to check before and after exercise. If you exercise longer or in a different way than you normally do, check your blood sugar more often. ? Follow your sick day plan whenever you cannot eat or drink normally. Make this plan ahead of time with your doctor.  Share your diabetes management plan with people in your workplace, school, and household.  Check your urine for ketones when you are ill and as told by your doctor.  Carry a card or wear jewelry that says that you have diabetes. Contact a doctor if:  Your blood sugar level is higher than 240 mg/dL (13.3 mmol/L) for 2 days in a row.  You have problems keeping your blood sugar in your target range.  High blood sugar happens often for you. Get help right away if:  You have trouble breathing.  You have a change in how you think, feel, or act (mental status).  You feel sick to your stomach (nauseous), and that feeling does not go away.  You cannot stop throwing up (vomiting). These symptoms may  be an emergency. Do not wait to see if the symptoms will go away. Get medical help right away. Call your local emergency services (911 in the [place] Do not drive yourself to the hospital. Summary  Hyperglycemia is when the sugar (glucose) level in your blood is too high.  High blood sugar can happen to people who do or do not have diabetes.  Make sure you drink enough fluids, eat healthy foods, and exercise regularly.  Contact your doctor if you have problems keeping your blood sugar in your target range. This information is not intended to replace advice given  to you by your health care provider. Make sure you discuss any questions you have with your health care provider. Document Revised: 03/24/2016 Document Reviewed: 03/24/2016 Elsevier Patient Education  2020 Else[employer]  Rehydration, Adult Rehydration is the replacement of body fluids and salts and minerals (electrolytes) that are lost during dehydration. Dehydration is when there is not enough fluid or water in the body. This happens when you lose more fluids than you take in. Common causes of dehydration include:  Vomiting.  Diarrhea.  Excessive sweating, such as from heat exposure or exercise.  Taking medicines that cause the body to lose excess fluid (diuretics).  Impaired kidney function.  Not drinking enough fluid.  Certain illnesses or infections.  Certain poorly controlled long-term (chronic) illnesses, such as diabetes, heart disease, and kidney disease.  Symptoms of mild dehydration may include thirst, dry lips and mouth, dry skin, and dizziness. Symptoms of severe dehydration may include increased heart rate, confusion, fainting, and not urinating. You can rehydrate by drinking certain fluids or getting fluids through an IV tube, as told by your health care provider. What are the risks? Generally, rehydration is safe. However, one problem that can happen is taking in too much fluid (overhydration). This is rare. If overhydration happens, it can cause an electrolyte imbalance, kidney failure, or a decrease in salt (sodium) levels in the body. How to rehydrate Follow instructions from your health care provider for rehydration. The kind of fluid you should drink and the amount you should drink depend on your condition.  If directed by your health care provider, drink an oral rehydration solution (ORS). This is a drink designed to treat dehydration that is found in pharmacies and retail stores. ? Make an ORS by following instructions on the package. ? Start by drinking small  amounts, about  cup (120 mL) every 5-10 minutes. ? Slowly increase how much you drink until you have taken the amount recommended by your health care provider.  Drink enough clear fluids to keep your urine clear or pale yellow. If you were instructed to drink an ORS, finish the ORS first, then start slowly drinking other clear fluids. Drink fluids such as: ? Water. Do not drink only water. Doing that can lead to having too little sodium in your body (hyponatremia). ? Ice chips. ? Fruit juice that you have added water to (diluted juice). ? Low-calorie sports drinks.  If you are severely dehydrated, your health care provider may recommend that you receive fluids through an IV tube in the hospital.  Do not take sodium tablets. Doing that can lead to the condition of having too much sodium in your body (hypernatremia). Eating while you rehydrate Follow instructions from your health care provider about what to eat while you rehydrate. Your health care provider may recommend that you slowly begin eating regular foods in small amounts.  Eat foods that contain a healthy  balance of electrolytes, such as bananas, oranges, potatoes, tomatoes, and spinach.  Avoid foods that are greasy or contain a lot of fat or sugar.  In some cases, you may get nutrition through a feeding tube that is passed through your nose and into your stomach (nasogastric tube, or NG tube). This may be done if you have uncontrolled vomiting or diarrhea. Beverages to avoid Certain beverages may make dehydration worse. While you rehydrate, avoid:  Alcohol.  Caffeine.  Drinks that contain a lot of sugar. These include: ? High-calorie sports drinks. ? Fruit juice that is not diluted. ? Soda.  Check nutrition labels to see how much sugar or caffeine a beverage contains. Signs of dehydration recovery You may be recovering from dehydration if:  You are urinating more often than before you started rehydrating.  Your urine is  clear or pale yellow.  Your energy level improves.  You vomit less frequently.  You have diarrhea less frequently.  Your appetite improves or returns to normal.  You feel less dizzy or less light-headed.  Your skin tone and color start to look more normal. Contact a health care provider if:  You continue to have symptoms of mild dehydration, such as: ? Thirst. ? Dry lips. ? Slightly dry mouth. ? Dry, warm skin. ? Dizziness.  You continue to vomit or have diarrhea. Get help right away if:  You have symptoms of dehydration that get worse.  You feel: ? Confused. ? Weak. ? Like you are going to faint.  You have not urinated in 6-8 hours.  You have very dark urine.  You have trouble breathing.  Your heart rate while sitting still is over 100 beats a minute.  You cannot drink fluids without vomiting.  You have vomiting or diarrhea that: ? Gets worse. ? Does not go away.  You have a fever. This information is not intended to replace advice given to you by your health care provider. Make sure you discuss any questions you have with your health care provider. Document Revised: 06/19/2017 Document Reviewed: 08/31/2015 Elsevier Patient Education  2020 [employer].

## 2020-01-05 ENCOUNTER — Other Ambulatory Visit: Payer: Self-pay | Admitting: Family Medicine

## 2020-01-05 ENCOUNTER — Encounter: Payer: Self-pay | Admitting: Family Medicine

## 2020-01-05 DIAGNOSIS — E785 Hyperlipidemia, unspecified: Secondary | ICD-10-CM

## 2020-01-05 DIAGNOSIS — E118 Type 2 diabetes mellitus with unspecified complications: Secondary | ICD-10-CM

## 2020-01-05 DIAGNOSIS — E559 Vitamin D deficiency, unspecified: Secondary | ICD-10-CM

## 2020-01-05 MED ORDER — VITAMIN D (ERGOCALCIFEROL) 1.25 MG (50000 UNIT) PO CAPS: 50000.0000 [IU] | ORAL_CAPSULE | ORAL | 5 refills | Status: DC

## 2020-01-05 MED ORDER — ATORVASTATIN CALCIUM 10 MG PO TABS: 10.0000 mg | ORAL_TABLET | Freq: Every day | ORAL | 3 refills | Status: DC

## 2020-01-05 NOTE — Progress Notes (Signed)
Attempted to contact patient, unable to reach and no option to leave voicemail.

## 2020-01-06 ENCOUNTER — Other Ambulatory Visit: Payer: Self-pay

## 2020-01-06 ENCOUNTER — Ambulatory Visit (INDEPENDENT_AMBULATORY_CARE_PROVIDER_SITE_OTHER): Payer: BC Managed Care – PPO | Admitting: Family Medicine

## 2020-01-06 VITALS — BP 177/123 | HR 101 | Temp 97.9°F | Ht 73.0 in | Wt 268.0 lb

## 2020-01-06 DIAGNOSIS — R7309 Other abnormal glucose: Secondary | ICD-10-CM

## 2020-01-06 DIAGNOSIS — I10 Essential (primary) hypertension: Secondary | ICD-10-CM | POA: Diagnosis not present

## 2020-01-06 DIAGNOSIS — R519 Headache, unspecified: Secondary | ICD-10-CM

## 2020-01-06 DIAGNOSIS — Z9114 Patient's other noncompliance with medication regimen: Secondary | ICD-10-CM

## 2020-01-06 DIAGNOSIS — E118 Type 2 diabetes mellitus with unspecified complications: Secondary | ICD-10-CM

## 2020-01-06 DIAGNOSIS — I160 Hypertensive urgency: Secondary | ICD-10-CM | POA: Diagnosis not present

## 2020-01-06 DIAGNOSIS — Z91148 Patient's other noncompliance with medication regimen for other reason: Secondary | ICD-10-CM

## 2020-01-06 DIAGNOSIS — Z09 Encounter for follow-up examination after completed treatment for conditions other than malignant neoplasm: Secondary | ICD-10-CM

## 2020-01-06 DIAGNOSIS — R739 Hyperglycemia, unspecified: Secondary | ICD-10-CM

## 2020-01-06 NOTE — Progress Notes (Signed)
[facility]   Established Patient Office Visit  Subjective:  Patient ID: Rodney Kane, male    DOB: 06-Jan-1976  Age: 44 y.o. MRN: [id number]  CC:  Chief Complaint  Patient presents with  . Hypertension    2 day follow up BP; hasnt started medication stated pharmacy will have in today  . Medication Reaction    Clonidine given last OV gave him a major headache later in the day; rather not have to take that again    HPI Rodney Kane is a 44 year old male who presents for Follow Up today.   Patient Active Problem List   Diagnosis Date Noted  . Noncompliance with medication regimen 01/04/2020  . Hyperglycemia 01/04/2020  . Urine ketones 01/04/2020  . Essential hypertension 01/04/2020  . NICM (nonischemic cardiomyopathy) ([place]) 01/16/2017  . 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 Acute combined systolic and diastolic heart failure ([place])   . Elevated troponin   . CHF (congestive heart failure) ([place]) 01/02/2017  . 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 Diabetes ([place]) 01/02/2017    Past Medical History:  Diagnosis Date  . Diabetes mellitus without complication ([place])   . Hemoglobin A1C greater than 9%, indicating poor diabetic control 12/2019  . Hyperglycemia 12/2019  . Hyperlipidemia 12/2019  . Hypertension   . Hypertensive urgency 12/2019  . Noncompliance with medication regimen 12/2019  . Urine ketones 12/2019  . Vitamin D deficiency 12/2019   Current Status: Since his last office visit, he is doing well with no complaints. He denies fatigue, frequent urination, blurred vision, excessive hunger, excessive thirst, weight gain, weight loss, and poor wound healing. He continues to check his feet regularly. Blood pressures are elevated today. He states that he has not picked up his hypertensive medications from pharmacy as of yet. He declines Clonidine today which he states gives him headaches. He denies  visual changes, chest pain, cough, shortness of breath, heart palpitations, and falls. He has occasional dizziness with position changes. Denies severe headaches, confusion, seizures, double vision, and blurred vision, nausea and vomiting. He denies fevers, chills, recent infections, weight loss, and night sweats.  Denies GI problems such as  diarrhea, and constipation. He has no reports of blood in stools, dysuria and hematuria. No depression or anxiety, and denies suicidal ideations, homicidal ideations, or auditory hallucinations. He is taking all medications as prescribed. He denies pain today.   Past Surgical History:  Procedure Laterality Date  . left knee surgery    . RIGHT/LEFT HEART CATH AND CORONARY ANGIOGRAPHY N/A 01/05/2017   Procedure: Right/Left Heart Cath and Coronary Angiography;  Surgeon: Leonie Man, MD;  Location: [facility];  Service: Cardiovascular;  Laterality: N/A;    Family History  Problem Relation Age of Onset  . Multiple sclerosis Mother   . Diabetic kidney disease Mother   . Hypertension Mother     Social History   Socioeconomic History  . Marital status: Divorced    Spouse name: Not on file  . Number of children: 3  . Years of education: Not on file  . Highest education level: Not on file  Occupational History  . Not on file  Tobacco Use  . Smoking status: Never Smoker  . Smokeless tobacco: Never Used  Vaping Use  . Vaping Use: Never used  Substance and Sexual Activity  . Alcohol use: No  . Drug use: No  . Sexual activity: Yes  Other  Topics Concern  . Not on file  Social History Narrative  . Not on file   Social Determinants of Health   Financial Resource Strain:   . Difficulty of Paying Living Expenses:   Food Insecurity:   . Worried About Charity fundraiser in the Last Year:   . Arboriculturist in the Last Year:   Transportation Needs:   . Film/video editor (Medical):   Marland Kitchen Lack of Transportation (Non-Medical):    Physical Activity:   . Days of Exercise per Week:   . Minutes of Exercise per Session:   Stress:   . Feeling of Stress :   Social Connections:   . Frequency of Communication with Friends and Family:   . Frequency of Social Gatherings with Friends and Family:   . Attends Religious Services:   . Active Member of Clubs or Organizations:   . Attends Archivist Meetings:   Marland Kitchen Marital Status:   Intimate Partner Violence:   . Fear of Current or Ex-Partner:   . Emotionally Abused:   Marland Kitchen Physically Abused:   . Sexually Abused:     Outpatient Medications Prior to Visit  Medication Sig Dispense Refill  . amLODipine (NORVASC) 10 MG tablet Take 1 tablet (10 mg total) by mouth daily. 90 tablet 3  . atorvastatin (LIPITOR) 10 MG tablet Take 1 tablet (10 mg total) by mouth daily. 90 tablet 3  . aspirin 81 MG EC tablet Take 1 tablet (81 mg total) by mouth daily. (Patient not taking: Reported on 06/24/2018) 30 tablet 0  . blood glucose meter kit and supplies Dispense based on patient and insurance preference. Use up to four times daily for blood glucose readings greater than 125. (Patient not taking: Reported on 01/04/2020) 1 each 0  . carvedilol (COREG) 12.5 MG tablet Take 1 tablet (12.5 mg total) by mouth 2 (two) times daily with a meal. 180 tablet 3  . glipiZIDE (GLUCOTROL) 10 MG tablet Take 1 tablet (10 mg total) by mouth 2 (two) times daily before a meal. 180 tablet 3  . insulin aspart (NOVOLOG) 100 UNIT/ML injection Inject 15 Units into the skin 3 (three) times daily with meals. (Patient not taking: Reported on 01/04/2020) 10 mL 11  . insulin glargine (LANTUS) 100 unit/mL SOPN Inject 0.5 mLs (50 Units total) into the skin at bedtime. 15 mL 11  . losartan (COZAAR) 25 MG tablet Take 1 tablet (25 mg total) by mouth daily. 30 tablet 3  . metFORMIN (GLUCOPHAGE) 1000 MG tablet Take 1 tablet (1,000 mg total) by mouth 2 (two) times daily with a meal. 180 tablet 3  . omeprazole (PRILOSEC) 40 MG capsule  Take 1 capsule (40 mg total) by mouth daily. (Patient not taking: Reported on 01/04/2020) 30 capsule 3  . potassium chloride SA (K-DUR,KLOR-CON) 20 MEQ tablet Take 1 tablet (20 mEq total) by mouth daily. (Patient not taking: Reported on 06/24/2018) 30 tablet 3  . Vitamin D, Ergocalciferol, (DRISDOL) 1.25 MG (50000 UNIT) CAPS capsule Take 1 capsule (50,000 Units total) by mouth every 7 (seven) days. 5 capsule 5   Facility-Administered Medications Prior to Visit  Medication Dose Route Frequency Provider Last Rate Last Admin  . insulin lispro (HUMALOG) injection 20 Units  20 Units Subcutaneous Once Kathe Becton M, FNP        No Known Allergies  ROS Review of Systems  Constitutional: Negative.   HENT: Negative.   Eyes: Positive for visual disturbance.  Respiratory: Negative.  Cardiovascular: Negative.   Gastrointestinal: Positive for abdominal distention.  Endocrine: Negative.   Genitourinary: Negative.   Musculoskeletal: Positive for arthralgias (generalized joint pain).  Skin: Negative.   Allergic/Immunologic: Negative.   Neurological: Positive for dizziness (occasional ) and headaches (occasional ).  Hematological: Negative.   Psychiatric/Behavioral: Negative.       Objective:    Physical Exam Vitals and nursing note reviewed.  Constitutional:      Appearance: Normal appearance. He is obese.  HENT:     Head: Normocephalic and atraumatic.     Nose: Nose normal.     Mouth/Throat:     Mouth: Mucous membranes are moist.  Cardiovascular:     Rate and Rhythm: Normal rate and regular rhythm.     Pulses: Normal pulses.     Heart sounds: Normal heart sounds.  Pulmonary:     Effort: Pulmonary effort is normal.     Breath sounds: Normal breath sounds.  Abdominal:     General: Bowel sounds are normal. There is distension.     Palpations: Abdomen is soft.  Musculoskeletal:        General: Normal range of motion.     Cervical back: Normal range of motion and neck supple.   Skin:    General: Skin is warm and dry.  Neurological:     General: No focal deficit present.     Mental Status: He is alert and oriented to person, place, and time.  Psychiatric:        Mood and Affect: Mood normal.        Behavior: Behavior normal.        Thought Content: Thought content normal.        Judgment: Judgment normal.     BP (!) 177/123 (BP Location: Left Arm, Patient Position: Sitting, Cuff Size: Large)   Pulse (!) 101   Temp 97.9 F (36.6 C)   Ht 6' 1"  (1.854 m)   Wt 268 lb (121.6 kg)   SpO2 99%   BMI 35.36 kg/m  Wt Readings from Last 3 Encounters:  01/06/20 268 lb (121.6 kg)  01/04/20 268 lb 0.8 oz (121.6 kg)  06/29/18 (!) 351 lb (159.2 kg)     Health Maintenance Due  Topic Date Due  . Hepatitis C Screening  Never done  . PNEUMOCOCCAL POLYSACCHARIDE VACCINE AGE 15-64 HIGH RISK  Never done  . OPHTHALMOLOGY EXAM  Never done  . URINE MICROALBUMIN  Never done  . COVID-19 Vaccine (1) Never done  . HEMOGLOBIN A1C  04/09/2018  . FOOT EXAM  06/30/2019    There are no preventive care reminders to display for this patient.  Lab Results  Component Value Date   TSH 0.700 01/04/2020   Lab Results  Component Value Date   WBC 5.5 01/04/2020   HGB 15.7 01/04/2020   HCT 48.8 01/04/2020   MCV 79.3 (L) 01/04/2020   PLT 276 01/04/2020   Lab Results  Component Value Date   NA 136 01/04/2020   K 3.6 01/04/2020   CO2 25 01/04/2020   GLUCOSE 310 (H) 01/04/2020   BUN 13 01/04/2020   CREATININE 0.90 01/04/2020   BILITOT 0.7 01/04/2020   ALKPHOS 82 01/04/2020   AST 13 (L) 01/04/2020   ALT 17 01/04/2020   PROT 7.6 01/04/2020   ALBUMIN 4.1 01/04/2020   CALCIUM 8.9 01/04/2020   ANIONGAP 9 01/04/2020   Lab Results  Component Value Date   CHOL 215 (H) 01/04/2020   Lab Results  Component  Value Date   HDL 48 01/04/2020   Lab Results  Component Value Date   LDLCALC 159 (H) 01/04/2020   Lab Results  Component Value Date   TRIG 38 01/04/2020   Lab  Results  Component Value Date   CHOLHDL 4.5 01/04/2020   Lab Results  Component Value Date   HGBA1C  01/04/2020     Comment:     Error #106 above 15%      Assessment & Plan:   1. Hypertensive urgency Blood pressures are elevated today. Patient declined Clonidine 0.3 mg in office and after repeat checks, blood pressures remain elevated. We referred him to ED via ambulance at this time. Patient refused and signed AMA form at discharge. He denies severe headaches, confusion, seizures, double vision, and blurred vision, nausea and vomiting. He will report to ED if he experiences these symptoms. Patient verbalized understanding.    2. Essential hypertension  3. Type 2 diabetes mellitus with complication, without long-term current use of insulin ([place]) He will continue medication as prescribed, to decrease foods/beverages high in sugars and carbs and follow Heart Healthy or DASH diet. Increase physical activity to at least 30 minutes cardio exercise daily.   4. Hemoglobin A1C greater than 9%, indicating poor diabetic control Not readable.   5. Hyperglycemia  6. Noncompliance with medication regimen He states that she will pick up medication today.   7. Nonintractable headache, unspecified chronicity pattern, unspecified headache type Stable today.   8. Follow up He will follow up on Monday, 01/09/2020 for blood pressure recheck.    No orders of the defined types were placed in this encounter.   No orders of the defined types were placed in this encounter.   Referral Orders  No referral(s) requested today    Kathe Becton,  MSN, FNP-BC [facility] [address] [place], [place] [address] [phone]- fax   Problem List Items Addressed This Visit      Cardiovascular and Mediastinum   Essential hypertension     Other   Hyperglycemia   Noncompliance with medication  regimen    Other Visit Diagnoses    Hypertensive urgency    -  Primary   Type 2 diabetes mellitus with complication, without long-term current use of insulin ([facility])       Hemoglobin A1C greater than 9%, indicating poor diabetic control       Nonintractable headache, unspecified chronicity pattern, unspecified headache type       Follow up          No orders of the defined types were placed in this encounter.   Follow-up: No follow-ups on file.    Azzie Glatter, FNP

## 2020-01-06 NOTE — Progress Notes (Signed)
Patient in office today, results given. Verbally understood.

## 2020-01-09 ENCOUNTER — Ambulatory Visit: Payer: Self-pay | Admitting: Family Medicine

## 2020-02-23 ENCOUNTER — Other Ambulatory Visit: Payer: Self-pay

## 2020-02-23 ENCOUNTER — Emergency Department (HOSPITAL_COMMUNITY)
Admission: EM | Admit: 2020-02-23 | Discharge: 2020-02-24 | Disposition: A | Discharge status: Home / Self Care | Payer: BC Managed Care – PPO | Attending: Emergency Medicine | Admitting: Emergency Medicine

## 2020-02-23 ENCOUNTER — Encounter (HOSPITAL_COMMUNITY): Payer: Self-pay

## 2020-02-23 DIAGNOSIS — N182 Chronic kidney disease, stage 2 (mild): Secondary | ICD-10-CM | POA: Insufficient documentation

## 2020-02-23 DIAGNOSIS — I130 Hypertensive heart and chronic kidney disease with heart failure and stage 1 through stage 4 chronic kidney disease, or unspecified chronic kidney disease: Secondary | ICD-10-CM | POA: Insufficient documentation

## 2020-02-23 DIAGNOSIS — I509 Heart failure, unspecified: Secondary | ICD-10-CM | POA: Insufficient documentation

## 2020-02-23 DIAGNOSIS — N50812 Left testicular pain: Secondary | ICD-10-CM | POA: Insufficient documentation

## 2020-02-23 DIAGNOSIS — I10 Essential (primary) hypertension: Secondary | ICD-10-CM

## 2020-02-23 DIAGNOSIS — R109 Unspecified abdominal pain: Secondary | ICD-10-CM | POA: Insufficient documentation

## 2020-02-23 DIAGNOSIS — E1165 Type 2 diabetes mellitus with hyperglycemia: Secondary | ICD-10-CM | POA: Diagnosis not present

## 2020-02-23 DIAGNOSIS — K828 Other specified diseases of gallbladder: Secondary | ICD-10-CM | POA: Diagnosis not present

## 2020-02-23 DIAGNOSIS — Z7982 Long term (current) use of aspirin: Secondary | ICD-10-CM | POA: Insufficient documentation

## 2020-02-23 DIAGNOSIS — R739 Hyperglycemia, unspecified: Secondary | ICD-10-CM

## 2020-02-23 DIAGNOSIS — R319 Hematuria, unspecified: Secondary | ICD-10-CM | POA: Diagnosis not present

## 2020-02-23 DIAGNOSIS — Z79899 Other long term (current) drug therapy: Secondary | ICD-10-CM | POA: Diagnosis not present

## 2020-02-23 DIAGNOSIS — Z794 Long term (current) use of insulin: Secondary | ICD-10-CM | POA: Diagnosis not present

## 2020-02-23 DIAGNOSIS — R102 Pelvic and perineal pain: Secondary | ICD-10-CM | POA: Diagnosis not present

## 2020-02-23 DIAGNOSIS — N281 Cyst of kidney, acquired: Secondary | ICD-10-CM | POA: Diagnosis not present

## 2020-02-23 DIAGNOSIS — I878 Other specified disorders of veins: Secondary | ICD-10-CM | POA: Diagnosis not present

## 2020-02-23 LAB — URINALYSIS, ROUTINE W REFLEX MICROSCOPIC
Bacteria, UA: NONE SEEN
Bilirubin Urine: NEGATIVE
Glucose, UA: >=500 mg/dL — AB
Ketones, ur: NEGATIVE mg/dL
Leukocytes,Ua: NEGATIVE
Nitrite: NEGATIVE
Protein, ur: NEGATIVE mg/dL
Specific Gravity, Urine: 1.029 (ref 1.005–1.030)
pH: 5 (ref 5.0–8.0)

## 2020-02-23 NOTE — ED Triage Notes (Addendum)
Pt arrive POV for eval of hematuria and L sided flank pain since Monday. Pt reports dyusria. Endorses testicular pain, reports he stopped taking his BP meds because he believed it was causing him testicular pain

## 2020-02-24 ENCOUNTER — Other Ambulatory Visit: Payer: Self-pay

## 2020-02-24 ENCOUNTER — Emergency Department (HOSPITAL_COMMUNITY): Payer: BC Managed Care – PPO

## 2020-02-24 DIAGNOSIS — N281 Cyst of kidney, acquired: Secondary | ICD-10-CM | POA: Diagnosis not present

## 2020-02-24 DIAGNOSIS — K828 Other specified diseases of gallbladder: Secondary | ICD-10-CM | POA: Diagnosis not present

## 2020-02-24 DIAGNOSIS — I878 Other specified disorders of veins: Secondary | ICD-10-CM | POA: Diagnosis not present

## 2020-02-24 DIAGNOSIS — R102 Pelvic and perineal pain: Secondary | ICD-10-CM | POA: Diagnosis not present

## 2020-02-24 LAB — COMPREHENSIVE METABOLIC PANEL
ALT: 13 U/L (ref 0–44)
AST: 14 U/L — ABNORMAL LOW (ref 15–41)
Albumin: 3.8 g/dL (ref 3.5–5.0)
Alkaline Phosphatase: 59 U/L (ref 38–126)
Anion gap: 11 (ref 5–15)
BUN: 10 mg/dL (ref 6–20)
CO2: 27 mmol/L (ref 22–32)
Calcium: 9.3 mg/dL (ref 8.9–10.3)
Chloride: 97 mmol/L — ABNORMAL LOW (ref 98–111)
Creatinine, Ser: 1.02 mg/dL (ref 0.61–1.24)
GFR calc Af Amer: >60 mL/min (ref >60)
GFR calc non Af Amer: >60 mL/min (ref >60)
Glucose, Bld: 303 mg/dL — ABNORMAL HIGH (ref 70–99)
Potassium: 3.8 mmol/L (ref 3.5–5.1)
Sodium: 135 mmol/L (ref 135–145)
Total Bilirubin: 0.6 mg/dL (ref 0.3–1.2)
Total Protein: 7.4 g/dL (ref 6.5–8.1)

## 2020-02-24 LAB — CBC WITH DIFFERENTIAL/PLATELET
Abs Immature Granulocytes: 0.02 10*3/uL (ref 0.00–0.07)
Basophils Absolute: 0.1 10*3/uL (ref 0.0–0.1)
Basophils Relative: 1 %
Eosinophils Absolute: 0.1 10*3/uL (ref 0.0–0.5)
Eosinophils Relative: 2 %
HCT: 45.8 % (ref 39.0–52.0)
Hemoglobin: 14.4 g/dL (ref 13.0–17.0)
Immature Granulocytes: 0 %
Lymphocytes Relative: 42 %
Lymphs Abs: 3.3 10*3/uL (ref 0.7–4.0)
MCH: 25.4 pg — ABNORMAL LOW (ref 26.0–34.0)
MCHC: 31.4 g/dL (ref 30.0–36.0)
MCV: 80.9 fL (ref 80.0–100.0)
Monocytes Absolute: 0.8 10*3/uL (ref 0.1–1.0)
Monocytes Relative: 10 %
Neutro Abs: 3.6 10*3/uL (ref 1.7–7.7)
Neutrophils Relative %: 45 %
Platelets: 313 10*3/uL (ref 150–400)
RBC: 5.66 MIL/uL (ref 4.22–5.81)
RDW: 12.4 % (ref 11.5–15.5)
WBC: 7.9 10*3/uL (ref 4.0–10.5)
nRBC: 0 % (ref 0.0–0.2)

## 2020-02-24 LAB — URINE CULTURE: Culture: NO GROWTH

## 2020-02-24 MED ORDER — SODIUM CHLORIDE 0.9 % IV BOLUS
1000.0000 mL | Freq: Once | INTRAVENOUS | Status: AC
  Administered 2020-02-24: 1000 mL via INTRAVENOUS

## 2020-02-24 NOTE — ED Notes (Signed)
Pt transported to CT ?

## 2020-02-24 NOTE — Discharge Instructions (Signed)
You need to restart your blood pressure and diabetes medicines.  Follow-up with your primary care physician for further treatment of these diseases.  Call the urologist today for an appointment in the next 1-2 weeks for work-up of your blood in your urine (hematuria).

## 2020-02-24 NOTE — ED Provider Notes (Signed)
[name] EMERGENCY DEPARTMENT Provider Note   CSN: [id number] Arrival date & time: 02/23/20  1656     History Chief Complaint  Patient presents with  . Hematuria  . Flank Pain    Rodney Kane is a 44 y.o. male.  HPI 44-year-old male presents with hematuria.  First noticed hematuria on 8/2.  Described it as a dark/orange color.  Did not occur again until yesterday.  Whenever he has discolor he noticed some pain at the end of urination.  He also gets some left flank pain going into his left testicle.  A couple weeks ago he noticed some soreness to both testicles so he stopped all of his medicines in case this was the problem.  Soreness seems to be mildly present over the last couple weeks.  No swelling or severe pain.  No penile discharge or concern for STI.   Past Medical History:  Diagnosis Date  . Diabetes mellitus without complication (HCC)   . Hemoglobin A1C greater than 9%, indicating poor diabetic control 12/2019  . Hyperglycemia 12/2019  . Hyperlipidemia 12/2019  . Hypertension   . Hypertensive urgency 12/2019  . Noncompliance with medication regimen 12/2019  . Urine ketones 12/2019  . Vitamin D deficiency 12/2019    Patient Active Problem List   Diagnosis Date Noted  . Noncompliance with medication regimen 01/04/2020  . Hyperglycemia 01/04/2020  . Urine ketones 01/04/2020  . Essential hypertension 01/04/2020  . NICM (nonischemic cardiomyopathy) (HCC) 01/16/2017  . 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 Acute combined systolic and diastolic heart failure (HCC)   . Elevated troponin   . CHF (congestive heart failure) (HCC) 01/02/2017  . 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 Diabetes (HCC) 01/02/2017    Past Surgical History:  Procedure Laterality Date  . left knee surgery    . RIGHT/LEFT HEART CATH AND CORONARY ANGIOGRAPHY N/A 01/05/2017   Procedure: Right/Left Heart Cath and Coronary Angiography;  Surgeon: Harding,  David W, MD;  Location: [facility] INVASIVE CV LAB;  Service: Cardiovascular;  Laterality: N/A;       Family History  Problem Relation Age of Onset  . Multiple sclerosis Mother   . Diabetic kidney disease Mother   . Hypertension Mother     Social History   Tobacco Use  . Smoking status: Never Smoker  . Smokeless tobacco: Never Used  Vaping Use  . Vaping Use: Never used  Substance Use Topics  . Alcohol use: No  . Drug use: No    Home Medications Prior to Admission medications   Medication Sig Start Date End Date Taking? Authorizing Provider  amLODipine (NORVASC) 10 MG tablet Take 1 tablet (10 mg total) by mouth daily. 01/04/20  Yes Stroud, Natalie M, FNP  aspirin 81 MG EC tablet Take 1 tablet (81 mg total) by mouth daily. 10/07/17  Yes Harris, Kimberly S, FNP  atorvastatin (LIPITOR) 10 MG tablet Take 1 tablet (10 mg total) by mouth daily. 01/05/20  Yes Stroud, Natalie M, FNP  carvedilol (COREG) 12.5 MG tablet Take 1 tablet (12.5 mg total) by mouth 2 (two) times daily with a meal. 01/04/20  Yes Stroud, Natalie M, FNP  glipiZIDE (GLUCOTROL) 10 MG tablet Take 1 tablet (10 mg total) by mouth 2 (two) times daily before a meal. 01/04/20  Yes Stroud, Natalie M, FNP  metFORMIN (GLUCOPHAGE) 1000 MG tablet Take 1 tablet (1,000 mg total) by mouth 2 (two) times daily with a meal. 01/04/20  Yes Stroud, Natalie M, FNP    Vitamin D, Ergocalciferol, (DRISDOL) 1.25 MG (50000 UNIT) CAPS capsule Take 1 capsule (50,000 Units total) by mouth every 7 (seven) days. Patient taking differently: Take 50,000 Units by mouth every 7 (seven) days. Saturdays 01/05/20  Yes Azzie Glatter, FNP  blood glucose meter kit and supplies Dispense based on patient and insurance preference. Use up to four times daily for blood glucose readings greater than 125. Patient not taking: Reported on 01/04/2020 06/29/18   Azzie Glatter, FNP  insulin aspart (NOVOLOG) 100 UNIT/ML injection Inject 15 Units into the skin 3 (three) times daily  with meals. Patient not taking: Reported on 02/24/2020 06/29/18   Azzie Glatter, FNP  insulin glargine (LANTUS) 100 unit/mL SOPN Inject 0.5 mLs (50 Units total) into the skin at bedtime. Patient not taking: Reported on 02/24/2020 01/04/20   Azzie Glatter, FNP  losartan (COZAAR) 25 MG tablet Take 1 tablet (25 mg total) by mouth daily. Patient not taking: Reported on 02/24/2020 06/29/18 02/24/20  Azzie Glatter, FNP  omeprazole (PRILOSEC) 40 MG capsule Take 1 capsule (40 mg total) by mouth daily. Patient not taking: Reported on 01/04/2020 06/29/18   Azzie Glatter, FNP  potassium chloride SA (K-DUR,KLOR-CON) 20 MEQ tablet Take 1 tablet (20 mEq total) by mouth daily. Patient not taking: Reported on 06/24/2018 10/07/17   Scot Jun, FNP    Allergies    Patient has no known allergies.  Review of Systems   Review of Systems  Gastrointestinal: Positive for abdominal pain. Negative for vomiting.  Genitourinary: Positive for dysuria, flank pain (left), hematuria and testicular pain.  All other systems reviewed and are negative.   Physical Exam Updated Vital Signs BP (!) 195/124   Pulse 77   Temp 98.7 F (37.1 C)   Resp 16   Ht 6' 1" (1.854 m)   Wt 119.7 kg   SpO2 99%   BMI 34.83 kg/m   Physical Exam Vitals and nursing note reviewed.  Constitutional:      General: He is not in acute distress.    Appearance: He is well-developed. He is obese. He is not ill-appearing or diaphoretic.  HENT:     Head: Normocephalic and atraumatic.     Right Ear: External ear normal.     Left Ear: External ear normal.     Nose: Nose normal.  Eyes:     General:        Right eye: No discharge.        Left eye: No discharge.  Cardiovascular:     Rate and Rhythm: Normal rate and regular rhythm.     Heart sounds: Normal heart sounds.  Pulmonary:     Effort: Pulmonary effort is normal.     Breath sounds: Normal breath sounds.  Abdominal:     General: There is no distension.      Palpations: Abdomen is soft.     Tenderness: There is no abdominal tenderness. There is no right CVA tenderness or left CVA tenderness.  Genitourinary:    Penis: No tenderness or discharge.      Testes:        Right: Tenderness or swelling not present.        Left: Tenderness or swelling not present.  Musculoskeletal:     Cervical back: Neck supple.  Skin:    General: Skin is warm and dry.  Neurological:     Mental Status: He is alert.  Psychiatric:        Mood and Affect:  Mood is not anxious.     ED Results / Procedures / Treatments   Labs (all labs ordered are listed, but only abnormal results are displayed) Labs Reviewed  URINALYSIS, ROUTINE W REFLEX MICROSCOPIC - Abnormal; Notable for the following components:      Result Value   APPearance HAZY (*)    Glucose, UA >=500 (*)    Hgb urine dipstick SMALL (*)    All other components within normal limits  COMPREHENSIVE METABOLIC PANEL - Abnormal; Notable for the following components:   Chloride 97 (*)    Glucose, Bld 303 (*)    AST 14 (*)    All other components within normal limits  CBC WITH DIFFERENTIAL/PLATELET - Abnormal; Notable for the following components:   MCH 25.4 (*)    All other components within normal limits  URINE CULTURE    EKG None  Radiology CT Renal Stone Study  Result Date: 02/24/2020 CLINICAL DATA:  LEFT groin and flank pain for 2 weeks suspected kidney stone EXAM: CT ABDOMEN AND PELVIS WITHOUT CONTRAST TECHNIQUE: Multidetector CT imaging of the abdomen and pelvis was performed following the standard protocol without IV contrast. Sagittal and coronal MPR images reconstructed from axial data set. No oral contrast administered. COMPARISON:  06/24/2018 FINDINGS: Lower chest: Lung bases clear Hepatobiliary: Contracted gallbladder.  Liver normal appearance Pancreas: Normal appearance Spleen: Normal appearance. Tiny probable splenule inferior to spleen. Adrenals/Urinary Tract: Adrenal glands normal  appearance. Stable RIGHT renal cyst. Kidneys, ureters, and bladder otherwise normal appearance. No urinary tract calcification or dilatation. Stomach/Bowel: Normal appendix. Stomach and bowel loops unremarkable for technique Vascular/Lymphatic: Tiny LEFT pelvic phleboliths stable. Aorta normal caliber. No adenopathy. Reproductive: Unremarkable prostate gland and seminal vesicles Other: No free air or free fluid. No hernia or inflammatory process. Musculoskeletal: Osseous structures unremarkable IMPRESSION: AT: IMPRESSION: AT Stable RIGHT renal cyst. No acute intra-abdominal or intrapelvic abnormalities. Electronically Signed   By: Mark  Boles M.D.   On: 02/24/2020 08:28    Procedures Procedures (including critical care time)  Medications Ordered in ED Medications  sodium chloride 0.9 % bolus 1,000 mL (0 mLs Intravenous Stopped 02/24/20 0901)    ED Course  I have reviewed the triage vital signs and the nursing notes.  Pertinent labs & imaging results that were available during my care of the patient were reviewed by me and considered in my medical decision making (see chart for details).    MDM Rules/Calculators/A&P                          Patient's exam is fairly benign.  He has no testicular tenderness or scrotal swelling.  His urine does have small amounts of blood in it.  CT renal study personally reviewed and is unremarkable except for a stable right renal cyst.  I did discuss this with the patient though it appears stable.  Labs are unremarkable except for hyperglycemia without acidosis.  I discussed the importance of getting back on his medicines as I think this is unlikely to be causing his testicular soreness.  My suspicion of torsion or mass is very low.  Do not think emergent ultrasound needed.  At this point, I think he needs a follow-up with urology for outpatient work-up.  Follow-up with PCP for his hypertension hyperglycemia.  He states he has enough medicines to restart. Final  Clinical Impression(s) / ED Diagnoses Final diagnoses:  Hematuria, unspecified type  Hyperglycemia  Hypertension, unspecified type    Rx /   DC Orders ED Discharge Orders    None       Goldston, Scott, MD 02/24/20 0925  

## 2020-06-11 ENCOUNTER — Telehealth: Payer: Self-pay | Admitting: Family Medicine

## 2020-06-11 NOTE — Telephone Encounter (Signed)
Pt was called about awv w/ pcc. Unable to lvm  

## 2020-06-20 DIAGNOSIS — B342 Coronavirus infection, unspecified: Secondary | ICD-10-CM

## 2020-06-20 DIAGNOSIS — U071 COVID-19: Secondary | ICD-10-CM

## 2020-06-20 HISTORY — DX: COVID-19: U07.1

## 2020-06-20 HISTORY — DX: Coronavirus infection, unspecified: B34.2

## 2020-07-16 ENCOUNTER — Other Ambulatory Visit: Payer: Self-pay

## 2020-07-16 ENCOUNTER — Emergency Department (HOSPITAL_COMMUNITY)
Admission: EM | Admit: 2020-07-16 | Discharge: 2020-07-17 | Disposition: A | Discharge status: Home / Self Care | Payer: BC Managed Care – PPO | Source: Home / Self Care | Attending: Emergency Medicine | Admitting: Emergency Medicine

## 2020-07-16 ENCOUNTER — Emergency Department (HOSPITAL_COMMUNITY): Payer: BC Managed Care – PPO

## 2020-07-16 DIAGNOSIS — R4182 Altered mental status, unspecified: Secondary | ICD-10-CM | POA: Diagnosis not present

## 2020-07-16 DIAGNOSIS — R519 Headache, unspecified: Secondary | ICD-10-CM | POA: Diagnosis not present

## 2020-07-16 DIAGNOSIS — R079 Chest pain, unspecified: Secondary | ICD-10-CM

## 2020-07-16 DIAGNOSIS — G4489 Other headache syndrome: Secondary | ICD-10-CM | POA: Diagnosis not present

## 2020-07-16 DIAGNOSIS — I639 Cerebral infarction, unspecified: Secondary | ICD-10-CM | POA: Diagnosis not present

## 2020-07-16 DIAGNOSIS — R059 Cough, unspecified: Secondary | ICD-10-CM | POA: Diagnosis not present

## 2020-07-16 DIAGNOSIS — N280 Ischemia and infarction of kidney: Secondary | ICD-10-CM | POA: Diagnosis not present

## 2020-07-16 DIAGNOSIS — R1010 Upper abdominal pain, unspecified: Secondary | ICD-10-CM | POA: Insufficient documentation

## 2020-07-16 DIAGNOSIS — R9082 White matter disease, unspecified: Secondary | ICD-10-CM | POA: Diagnosis not present

## 2020-07-16 DIAGNOSIS — R404 Transient alteration of awareness: Secondary | ICD-10-CM | POA: Diagnosis not present

## 2020-07-16 DIAGNOSIS — Z79899 Other long term (current) drug therapy: Secondary | ICD-10-CM | POA: Diagnosis not present

## 2020-07-16 DIAGNOSIS — I10 Essential (primary) hypertension: Secondary | ICD-10-CM | POA: Insufficient documentation

## 2020-07-16 DIAGNOSIS — Z955 Presence of coronary angioplasty implant and graft: Secondary | ICD-10-CM | POA: Insufficient documentation

## 2020-07-16 DIAGNOSIS — I428 Other cardiomyopathies: Secondary | ICD-10-CM | POA: Diagnosis not present

## 2020-07-16 DIAGNOSIS — Z7982 Long term (current) use of aspirin: Secondary | ICD-10-CM | POA: Diagnosis not present

## 2020-07-16 DIAGNOSIS — I6389 Other cerebral infarction: Secondary | ICD-10-CM | POA: Diagnosis not present

## 2020-07-16 DIAGNOSIS — I619 Nontraumatic intracerebral hemorrhage, unspecified: Secondary | ICD-10-CM | POA: Diagnosis not present

## 2020-07-16 DIAGNOSIS — Z820 Family history of epilepsy and other diseases of the nervous system: Secondary | ICD-10-CM | POA: Diagnosis not present

## 2020-07-16 DIAGNOSIS — I5020 Unspecified systolic (congestive) heart failure: Secondary | ICD-10-CM | POA: Diagnosis not present

## 2020-07-16 DIAGNOSIS — R0602 Shortness of breath: Secondary | ICD-10-CM | POA: Insufficient documentation

## 2020-07-16 DIAGNOSIS — Z794 Long term (current) use of insulin: Secondary | ICD-10-CM | POA: Diagnosis not present

## 2020-07-16 DIAGNOSIS — R072 Precordial pain: Secondary | ICD-10-CM

## 2020-07-16 DIAGNOSIS — R410 Disorientation, unspecified: Secondary | ICD-10-CM | POA: Diagnosis not present

## 2020-07-16 DIAGNOSIS — I313 Pericardial effusion (noninflammatory): Secondary | ICD-10-CM | POA: Diagnosis not present

## 2020-07-16 DIAGNOSIS — Z20822 Contact with and (suspected) exposure to covid-19: Secondary | ICD-10-CM | POA: Insufficient documentation

## 2020-07-16 DIAGNOSIS — E1165 Type 2 diabetes mellitus with hyperglycemia: Secondary | ICD-10-CM | POA: Insufficient documentation

## 2020-07-16 DIAGNOSIS — Z9114 Patient's other noncompliance with medication regimen: Secondary | ICD-10-CM | POA: Diagnosis not present

## 2020-07-16 DIAGNOSIS — R4701 Aphasia: Secondary | ICD-10-CM | POA: Diagnosis not present

## 2020-07-16 DIAGNOSIS — K219 Gastro-esophageal reflux disease without esophagitis: Secondary | ICD-10-CM | POA: Diagnosis present

## 2020-07-16 DIAGNOSIS — E871 Hypo-osmolality and hyponatremia: Secondary | ICD-10-CM | POA: Diagnosis not present

## 2020-07-16 DIAGNOSIS — I5022 Chronic systolic (congestive) heart failure: Secondary | ICD-10-CM | POA: Diagnosis not present

## 2020-07-16 DIAGNOSIS — R260 Ataxic gait: Secondary | ICD-10-CM | POA: Diagnosis not present

## 2020-07-16 DIAGNOSIS — R0600 Dyspnea, unspecified: Secondary | ICD-10-CM | POA: Diagnosis not present

## 2020-07-16 DIAGNOSIS — R0789 Other chest pain: Secondary | ICD-10-CM | POA: Diagnosis not present

## 2020-07-16 DIAGNOSIS — E872 Acidosis: Secondary | ICD-10-CM | POA: Diagnosis not present

## 2020-07-16 DIAGNOSIS — Z8249 Family history of ischemic heart disease and other diseases of the circulatory system: Secondary | ICD-10-CM | POA: Diagnosis not present

## 2020-07-16 DIAGNOSIS — Z841 Family history of disorders of kidney and ureter: Secondary | ICD-10-CM | POA: Diagnosis not present

## 2020-07-16 DIAGNOSIS — I513 Intracardiac thrombosis, not elsewhere classified: Secondary | ICD-10-CM | POA: Diagnosis not present

## 2020-07-16 DIAGNOSIS — U071 COVID-19: Secondary | ICD-10-CM | POA: Diagnosis not present

## 2020-07-16 DIAGNOSIS — I5021 Acute systolic (congestive) heart failure: Secondary | ICD-10-CM | POA: Diagnosis not present

## 2020-07-16 DIAGNOSIS — J9811 Atelectasis: Secondary | ICD-10-CM | POA: Diagnosis not present

## 2020-07-16 DIAGNOSIS — Z01818 Encounter for other preprocedural examination: Secondary | ICD-10-CM | POA: Diagnosis not present

## 2020-07-16 DIAGNOSIS — E118 Type 2 diabetes mellitus with unspecified complications: Secondary | ICD-10-CM | POA: Diagnosis not present

## 2020-07-16 DIAGNOSIS — R000 Tachycardia, unspecified: Secondary | ICD-10-CM | POA: Diagnosis not present

## 2020-07-16 DIAGNOSIS — I5041 Acute combined systolic (congestive) and diastolic (congestive) heart failure: Secondary | ICD-10-CM | POA: Diagnosis not present

## 2020-07-16 DIAGNOSIS — I629 Nontraumatic intracranial hemorrhage, unspecified: Secondary | ICD-10-CM | POA: Diagnosis not present

## 2020-07-16 DIAGNOSIS — J90 Pleural effusion, not elsewhere classified: Secondary | ICD-10-CM | POA: Diagnosis not present

## 2020-07-16 DIAGNOSIS — E785 Hyperlipidemia, unspecified: Secondary | ICD-10-CM | POA: Diagnosis not present

## 2020-07-16 DIAGNOSIS — I110 Hypertensive heart disease with heart failure: Secondary | ICD-10-CM | POA: Diagnosis not present

## 2020-07-16 DIAGNOSIS — R9431 Abnormal electrocardiogram [ECG] [EKG]: Secondary | ICD-10-CM | POA: Diagnosis not present

## 2020-07-16 DIAGNOSIS — I63512 Cerebral infarction due to unspecified occlusion or stenosis of left middle cerebral artery: Secondary | ICD-10-CM | POA: Diagnosis not present

## 2020-07-16 DIAGNOSIS — E139 Other specified diabetes mellitus without complications: Secondary | ICD-10-CM | POA: Diagnosis not present

## 2020-07-16 DIAGNOSIS — I5043 Acute on chronic combined systolic (congestive) and diastolic (congestive) heart failure: Secondary | ICD-10-CM | POA: Diagnosis not present

## 2020-07-16 DIAGNOSIS — R509 Fever, unspecified: Secondary | ICD-10-CM | POA: Diagnosis not present

## 2020-07-16 LAB — BASIC METABOLIC PANEL
Anion gap: 9 (ref 5–15)
BUN: 14 mg/dL (ref 6–20)
CO2: 27 mmol/L (ref 22–32)
Calcium: 9.1 mg/dL (ref 8.9–10.3)
Chloride: 105 mmol/L (ref 98–111)
Creatinine, Ser: 1.02 mg/dL (ref 0.61–1.24)
GFR, Estimated: >60 mL/min (ref >60)
Glucose, Bld: 141 mg/dL — ABNORMAL HIGH (ref 70–99)
Potassium: 4 mmol/L (ref 3.5–5.1)
Sodium: 141 mmol/L (ref 135–145)

## 2020-07-16 LAB — RESP PANEL BY RT-PCR (FLU A&B, COVID) ARPGX2
Influenza A by PCR: NEGATIVE
Influenza B by PCR: NEGATIVE
SARS Coronavirus 2 by RT PCR: NEGATIVE

## 2020-07-16 LAB — CBC
HCT: 41.2 % (ref 39.0–52.0)
Hemoglobin: 13.2 g/dL (ref 13.0–17.0)
MCH: 26.2 pg (ref 26.0–34.0)
MCHC: 32 g/dL (ref 30.0–36.0)
MCV: 81.7 fL (ref 80.0–100.0)
Platelets: 281 10*3/uL (ref 150–400)
RBC: 5.04 MIL/uL (ref 4.22–5.81)
RDW: 13.4 % (ref 11.5–15.5)
WBC: 4.8 10*3/uL (ref 4.0–10.5)
nRBC: 0 % (ref 0.0–0.2)

## 2020-07-16 LAB — TROPONIN I (HIGH SENSITIVITY)
Troponin I (High Sensitivity): 13 ng/L (ref <18)
Troponin I (High Sensitivity): 12 ng/L (ref <18)

## 2020-07-16 LAB — D-DIMER, QUANTITATIVE: D-Dimer, Quant: 0.58 ug/mL-FEU — ABNORMAL HIGH (ref 0.00–0.50)

## 2020-07-16 NOTE — ED Provider Notes (Signed)
[facility]-EMERGENCY DEPT [facility] Emergency Department Provider Note MRN:  [id number]  Arrival date & time: 07/16/20     Chief Complaint   Chest Pain and Shortness of Breath   History of Present Illness   Rodney Kane is a 44 y.o. year-old male with a history of diabetes presenting to the ED with chief complaint of chest pain shortness of breath.  For the past 2 weeks has been feeling pressure in the chest and upper abdomen when he wakes up from sleep, feels like he cannot take a full breath.  Gets better during the day.  Was much more severe this morning and came in for evaluation.  Denies fever or cough, no lower abdominal pain, no nausea vomiting, no dizziness or diaphoresis.  Review of Systems  A complete 10 system review of systems was obtained and all systems are negative except as noted in the HPI and PMH.   Patient's Health History    Past Medical History:  Diagnosis Date  . Diabetes mellitus without complication (HCC)   . Hemoglobin A1C greater than 9%, indicating poor diabetic control 12/2019  . Hyperglycemia 12/2019  . Hyperlipidemia 12/2019  . Hypertension   . Hypertensive urgency 12/2019  . Noncompliance with medication regimen 12/2019  . Urine ketones 12/2019  . Vitamin D deficiency 12/2019    Past Surgical History:  Procedure Laterality Date  . left knee surgery    . RIGHT/LEFT HEART CATH AND CORONARY ANGIOGRAPHY N/A 01/05/2017   Procedure: Right/Left Heart Cath and Coronary Angiography;  Surgeon: [name], MD;  Location: [facility] INVASIVE CV LAB;  Service: Cardiovascular;  Laterality: N/A;    Family History  Problem Relation Age of Onset  . Multiple sclerosis Mother   . Diabetic kidney disease Mother   . Hypertension Mother     Social History   Socioeconomic History  . Marital status: Divorced    Spouse name: Not on file  . Number of children: 3  . Years of education: Not on file  . Highest education level: Not on file  Occupational History  .  Not on file  Tobacco Use  . Smoking status: Never Smoker  . Smokeless tobacco: Never Used  Vaping Use  . Vaping Use: Never used  Substance and Sexual Activity  . Alcohol use: No  . Drug use: No  . Sexual activity: Yes  Other Topics Concern  . Not on file  Social History Narrative  . Not on file   Social Determinants of Health   Financial Resource Strain: Not on file  Food Insecurity: Not on file  Transportation Needs: Not on file  Physical Activity: Not on file  Stress: Not on file  Social Connections: Not on file  Intimate Partner Violence: Not on file     Physical Exam   Vitals:   07/16/20 2005 07/16/20 2200  BP: (!) 146/103 (!) 152/112  Pulse: (!) 114 (!) 112  Resp: 16 20  Temp:    SpO2: 97% 97%    CONSTITUTIONAL: Well-appearing, NAD NEURO:  Alert and oriented x 3, no focal deficits EYES:  eyes equal and reactive ENT/NECK:  no LAD, no JVD CARDIO: Tachycardic rate, well-perfused, normal S1 and S2 PULM:  CTAB no wheezing or rhonchi GI/GU:  normal bowel sounds, non-distended, non-tender MSK/SPINE:  No gross deformities, no edema SKIN:  no rash, atraumatic PSYCH:  Appropriate speech and behavior  *Additional and/or pertinent findings included in MDM below  Diagnostic and Interventional Summary    EKG Interpretation  Date/Time:  Monday July 16 2020 16:24:59 EST Ventricular Rate:  112 PR Interval:  154 QRS Duration: 90 QT Interval:  344 QTC Calculation: 469 R Axis:   81 Text Interpretation: Sinus tachycardia Septal infarct , age undetermined T wave abnormality, consider lateral ischemia Abnormal ECG No significant change was found Confirmed by Kennis Carina [phone]) on 07/16/2020 9:49:15 PM      Labs Reviewed  BASIC METABOLIC PANEL - Abnormal; Notable for the following components:      Result Value   Glucose, Bld 141 (*)    All other components within normal limits  RESP PANEL BY RT-PCR (FLU A&B, COVID) [id number]  CBC  D-DIMER, QUANTITATIVE (NOT AT  [facility])  TROPONIN I (HIGH SENSITIVITY)  TROPONIN I (HIGH SENSITIVITY)    DG Chest Port 1 View  Final Result      Medications - No data to display   Procedures  /  Critical Care Procedures  ED Course and Medical Decision Making  I have reviewed the triage vital signs, the nursing notes, and pertinent available records from the EMR.  Listed above are laboratory and imaging tests that I personally ordered, reviewed, and interpreted and then considered in my medical decision making (see below for details).  Suspect GERD versus atelectasis, felt to be low risk for PE.  Given the tachycardia on arrival will screen with D-dimer.  ACS also considered but felt to be more likely noncardiac.  EKG is unchanged, troponin is negative.  With a second negative troponin and negative D-dimer, patient would be appropriate for discharge.  Signed out to oncoming provider at shift change.       Elmer Sow. Pilar Plate, MD [facility] Emergency Medicine [facility] [email or web address]  Final Clinical Impressions(s) / ED Diagnoses     ICD-10-CM   1. SOB (shortness of breath)  R06.02 DG Chest [facility] 1 View    DG Chest [facility] 1 View  2. Chest pain  R07.9 DG Chest [facility] 1 View    DG Chest Port 1 View    ED Discharge Orders    None       Discharge Instructions Discussed with and Provided to Patient:   Discharge Instructions   None       Sabas Sous, MD 07/16/20 2226

## 2020-07-16 NOTE — ED Triage Notes (Addendum)
Pt presents to ED POV. Pt c/o SOB and CP. CP is sharp and intermittent on L side, nonradiating. Pt also has productive cough. Pt unvaccinated. Hx MI 2017

## 2020-07-16 NOTE — ED Notes (Signed)
Pt refuses covid swab at this time.

## 2020-07-17 ENCOUNTER — Emergency Department (HOSPITAL_COMMUNITY): Payer: BC Managed Care – PPO

## 2020-07-17 MED ORDER — IOHEXOL 350 MG/ML SOLN
75.0000 mL | Freq: Once | INTRAVENOUS | Status: AC | PRN
  Administered 2020-07-17: 75 mL via INTRAVENOUS

## 2020-07-17 NOTE — ED Notes (Signed)
Patient transported to CT 

## 2020-07-17 NOTE — ED Notes (Signed)
Back to room at this time.

## 2020-07-17 NOTE — ED Provider Notes (Signed)
I assumed care of this patient.  Please see previous provider note for further details of Hx, PE.  Briefly patient is a 44 y.o. male who presented several weeks chest pain and DOE pending Dimer to rule out PE.  Appears patient is currently on phenylephrine-APAP which is likely contributing to his tachycardia and HTN.  Dimer is slightly positive. Will obtain CTA PE.  CT negative for PE. Recommended Coricidin HBP for congestion.  The patient appears reasonably screened and/or stabilized for discharge and I doubt any other medical condition or other [facility] requiring further screening, evaluation, or treatment in the ED at this time prior to discharge. Safe for discharge with strict return precautions.  Disposition: Discharge  Condition: Good  I have discussed the results, Dx and Tx plan with the patient/family who expressed understanding and agree(s) with the plan. Discharge instructions discussed at length. The patient/family was given strict return precautions who verbalized understanding of the instructions. No further questions at time of discharge.    ED Discharge Orders    None       Follow Up: Kallie Locks, FNP [address] [place] [address] [phone]  Call  To schedule an appointment for close follow up         Lyon Dumont, Amadeo Garnet, MD 07/17/20 0225

## 2020-07-17 NOTE — ED Notes (Signed)
IV team at bedside 

## 2020-07-19 ENCOUNTER — Emergency Department (HOSPITAL_COMMUNITY): Payer: BC Managed Care – PPO

## 2020-07-19 ENCOUNTER — Inpatient Hospital Stay (HOSPITAL_COMMUNITY)
Admission: EM | Admit: 2020-07-19 | Discharge: 2020-07-28 | DRG: 064 | Disposition: A | Discharge status: Home Health | Payer: BC Managed Care – PPO | Attending: Family Medicine | Admitting: Family Medicine

## 2020-07-19 DIAGNOSIS — E1165 Type 2 diabetes mellitus with hyperglycemia: Secondary | ICD-10-CM | POA: Diagnosis present

## 2020-07-19 DIAGNOSIS — R233 Spontaneous ecchymoses: Secondary | ICD-10-CM | POA: Diagnosis present

## 2020-07-19 DIAGNOSIS — Z820 Family history of epilepsy and other diseases of the nervous system: Secondary | ICD-10-CM | POA: Diagnosis not present

## 2020-07-19 DIAGNOSIS — R0789 Other chest pain: Secondary | ICD-10-CM | POA: Diagnosis present

## 2020-07-19 DIAGNOSIS — R4701 Aphasia: Secondary | ICD-10-CM | POA: Diagnosis present

## 2020-07-19 DIAGNOSIS — E872 Acidosis: Secondary | ICD-10-CM | POA: Diagnosis present

## 2020-07-19 DIAGNOSIS — I110 Hypertensive heart disease with heart failure: Secondary | ICD-10-CM | POA: Diagnosis present

## 2020-07-19 DIAGNOSIS — R260 Ataxic gait: Secondary | ICD-10-CM | POA: Diagnosis present

## 2020-07-19 DIAGNOSIS — I639 Cerebral infarction, unspecified: Secondary | ICD-10-CM

## 2020-07-19 DIAGNOSIS — R7309 Other abnormal glucose: Secondary | ICD-10-CM

## 2020-07-19 DIAGNOSIS — Z7982 Long term (current) use of aspirin: Secondary | ICD-10-CM

## 2020-07-19 DIAGNOSIS — I5020 Unspecified systolic (congestive) heart failure: Secondary | ICD-10-CM | POA: Diagnosis not present

## 2020-07-19 DIAGNOSIS — I513 Intracardiac thrombosis, not elsewhere classified: Secondary | ICD-10-CM

## 2020-07-19 DIAGNOSIS — R739 Hyperglycemia, unspecified: Secondary | ICD-10-CM

## 2020-07-19 DIAGNOSIS — E118 Type 2 diabetes mellitus with unspecified complications: Secondary | ICD-10-CM | POA: Diagnosis not present

## 2020-07-19 DIAGNOSIS — U071 COVID-19: Secondary | ICD-10-CM | POA: Diagnosis present

## 2020-07-19 DIAGNOSIS — Z9114 Patient's other noncompliance with medication regimen: Secondary | ICD-10-CM | POA: Diagnosis not present

## 2020-07-19 DIAGNOSIS — E139 Other specified diabetes mellitus without complications: Secondary | ICD-10-CM | POA: Diagnosis not present

## 2020-07-19 DIAGNOSIS — E669 Obesity, unspecified: Secondary | ICD-10-CM | POA: Diagnosis present

## 2020-07-19 DIAGNOSIS — Z794 Long term (current) use of insulin: Secondary | ICD-10-CM

## 2020-07-19 DIAGNOSIS — E785 Hyperlipidemia, unspecified: Secondary | ICD-10-CM | POA: Diagnosis present

## 2020-07-19 DIAGNOSIS — Z841 Family history of disorders of kidney and ureter: Secondary | ICD-10-CM

## 2020-07-19 DIAGNOSIS — Z8249 Family history of ischemic heart disease and other diseases of the circulatory system: Secondary | ICD-10-CM | POA: Diagnosis not present

## 2020-07-19 DIAGNOSIS — I5043 Acute on chronic combined systolic (congestive) and diastolic (congestive) heart failure: Secondary | ICD-10-CM | POA: Diagnosis present

## 2020-07-19 DIAGNOSIS — E861 Hypovolemia: Secondary | ICD-10-CM | POA: Diagnosis present

## 2020-07-19 DIAGNOSIS — I5041 Acute combined systolic (congestive) and diastolic (congestive) heart failure: Secondary | ICD-10-CM | POA: Diagnosis not present

## 2020-07-19 DIAGNOSIS — R9431 Abnormal electrocardiogram [ECG] [EKG]: Secondary | ICD-10-CM

## 2020-07-19 DIAGNOSIS — K219 Gastro-esophageal reflux disease without esophagitis: Secondary | ICD-10-CM | POA: Diagnosis present

## 2020-07-19 DIAGNOSIS — E871 Hypo-osmolality and hyponatremia: Secondary | ICD-10-CM | POA: Diagnosis present

## 2020-07-19 DIAGNOSIS — R29706 NIHSS score 6: Secondary | ICD-10-CM | POA: Diagnosis present

## 2020-07-19 DIAGNOSIS — Z283 Underimmunization status: Secondary | ICD-10-CM

## 2020-07-19 DIAGNOSIS — R000 Tachycardia, unspecified: Secondary | ICD-10-CM

## 2020-07-19 DIAGNOSIS — I5021 Acute systolic (congestive) heart failure: Secondary | ICD-10-CM | POA: Diagnosis not present

## 2020-07-19 DIAGNOSIS — I5022 Chronic systolic (congestive) heart failure: Secondary | ICD-10-CM | POA: Diagnosis not present

## 2020-07-19 DIAGNOSIS — N280 Ischemia and infarction of kidney: Secondary | ICD-10-CM | POA: Diagnosis present

## 2020-07-19 DIAGNOSIS — I313 Pericardial effusion (noninflammatory): Secondary | ICD-10-CM | POA: Diagnosis present

## 2020-07-19 DIAGNOSIS — Z5309 Procedure and treatment not carried out because of other contraindication: Secondary | ICD-10-CM

## 2020-07-19 DIAGNOSIS — I63512 Cerebral infarction due to unspecified occlusion or stenosis of left middle cerebral artery: Principal | ICD-10-CM | POA: Diagnosis present

## 2020-07-19 DIAGNOSIS — I509 Heart failure, unspecified: Secondary | ICD-10-CM

## 2020-07-19 DIAGNOSIS — I428 Other cardiomyopathies: Secondary | ICD-10-CM | POA: Diagnosis present

## 2020-07-19 DIAGNOSIS — A419 Sepsis, unspecified organism: Secondary | ICD-10-CM

## 2020-07-19 DIAGNOSIS — E119 Type 2 diabetes mellitus without complications: Secondary | ICD-10-CM

## 2020-07-19 DIAGNOSIS — Z79899 Other long term (current) drug therapy: Secondary | ICD-10-CM | POA: Diagnosis not present

## 2020-07-19 DIAGNOSIS — I629 Nontraumatic intracranial hemorrhage, unspecified: Secondary | ICD-10-CM | POA: Diagnosis present

## 2020-07-19 DIAGNOSIS — Z6835 Body mass index (BMI) 35.0-35.9, adult: Secondary | ICD-10-CM

## 2020-07-19 DIAGNOSIS — E876 Hypokalemia: Secondary | ICD-10-CM | POA: Diagnosis present

## 2020-07-19 DIAGNOSIS — R4182 Altered mental status, unspecified: Secondary | ICD-10-CM

## 2020-07-19 DIAGNOSIS — R6520 Severe sepsis without septic shock: Secondary | ICD-10-CM

## 2020-07-19 LAB — CBG MONITORING, ED: Glucose-Capillary: 187 mg/dL — ABNORMAL HIGH (ref 70–99)

## 2020-07-19 LAB — CBC WITH DIFFERENTIAL/PLATELET
Abs Immature Granulocytes: 0.02 10*3/uL (ref 0.00–0.07)
Basophils Absolute: 0 10*3/uL (ref 0.0–0.1)
Basophils Relative: 0 %
Eosinophils Absolute: 0 10*3/uL (ref 0.0–0.5)
Eosinophils Relative: 0 %
HCT: 44.2 % (ref 39.0–52.0)
Hemoglobin: 14.1 g/dL (ref 13.0–17.0)
Immature Granulocytes: 0 %
Lymphocytes Relative: 12 %
Lymphs Abs: 0.8 10*3/uL (ref 0.7–4.0)
MCH: 25.3 pg — ABNORMAL LOW (ref 26.0–34.0)
MCHC: 31.9 g/dL (ref 30.0–36.0)
MCV: 79.2 fL — ABNORMAL LOW (ref 80.0–100.0)
Monocytes Absolute: 0.7 10*3/uL (ref 0.1–1.0)
Monocytes Relative: 11 %
Neutro Abs: 5 10*3/uL (ref 1.7–7.7)
Neutrophils Relative %: 77 %
Platelets: 242 10*3/uL (ref 150–400)
RBC: 5.58 MIL/uL (ref 4.22–5.81)
RDW: 12.8 % (ref 11.5–15.5)
WBC: 6.5 10*3/uL (ref 4.0–10.5)
nRBC: 0 % (ref 0.0–0.2)

## 2020-07-19 LAB — I-STAT VENOUS BLOOD GAS, ED
Acid-Base Excess: 3 mmol/L — ABNORMAL HIGH (ref 0.0–2.0)
Bicarbonate: 25.6 mmol/L (ref 20.0–28.0)
Calcium, Ion: 1.05 mmol/L — ABNORMAL LOW (ref 1.15–1.40)
HCT: 45 % (ref 39.0–52.0)
Hemoglobin: 15.3 g/dL (ref 13.0–17.0)
O2 Saturation: 54 %
Potassium: 3.9 mmol/L (ref 3.5–5.1)
Sodium: 133 mmol/L — ABNORMAL LOW (ref 135–145)
TCO2: 27 mmol/L (ref 22–32)
pCO2, Ven: 34.1 mmHg — ABNORMAL LOW (ref 44.0–60.0)
pH, Ven: 7.483 — ABNORMAL HIGH (ref 7.250–7.430)
pO2, Ven: 26 mmHg — CL (ref 32.0–45.0)

## 2020-07-19 LAB — COMPREHENSIVE METABOLIC PANEL
ALT: 21 U/L (ref 0–44)
AST: 22 U/L (ref 15–41)
Albumin: 3.6 g/dL (ref 3.5–5.0)
Alkaline Phosphatase: 48 U/L (ref 38–126)
Anion gap: 15 (ref 5–15)
BUN: 15 mg/dL (ref 6–20)
CO2: 21 mmol/L — ABNORMAL LOW (ref 22–32)
Calcium: 9 mg/dL (ref 8.9–10.3)
Chloride: 94 mmol/L — ABNORMAL LOW (ref 98–111)
Creatinine, Ser: 1.19 mg/dL (ref 0.61–1.24)
GFR, Estimated: >60 mL/min (ref >60)
Glucose, Bld: 182 mg/dL — ABNORMAL HIGH (ref 70–99)
Potassium: 3.8 mmol/L (ref 3.5–5.1)
Sodium: 130 mmol/L — ABNORMAL LOW (ref 135–145)
Total Bilirubin: 1 mg/dL (ref 0.3–1.2)
Total Protein: 7.4 g/dL (ref 6.5–8.1)

## 2020-07-19 LAB — LIPASE, BLOOD: Lipase: 16 U/L (ref 11–51)

## 2020-07-19 LAB — TRIGLYCERIDES: Triglycerides: 54 mg/dL (ref <150)

## 2020-07-19 LAB — APTT: aPTT: 33 seconds (ref 24–36)

## 2020-07-19 LAB — TROPONIN I (HIGH SENSITIVITY): Troponin I (High Sensitivity): 45 ng/L — ABNORMAL HIGH (ref <18)

## 2020-07-19 LAB — MAGNESIUM: Magnesium: 1.8 mg/dL (ref 1.7–2.4)

## 2020-07-19 LAB — LACTIC ACID, PLASMA
Lactic Acid, Venous: 2.2 mmol/L (ref 0.5–1.9)
Lactic Acid, Venous: 2.6 mmol/L (ref 0.5–1.9)

## 2020-07-19 LAB — ETHANOL: Alcohol, Ethyl (B): <10 mg/dL (ref <10)

## 2020-07-19 LAB — TSH: TSH: 1.447 u[IU]/mL (ref 0.350–4.500)

## 2020-07-19 LAB — RESP PANEL BY RT-PCR (FLU A&B, COVID) ARPGX2
Influenza A by PCR: NEGATIVE
Influenza B by PCR: NEGATIVE
SARS Coronavirus 2 by RT PCR: POSITIVE — AB

## 2020-07-19 LAB — BRAIN NATRIURETIC PEPTIDE: B Natriuretic Peptide: 1619.5 pg/mL — ABNORMAL HIGH (ref 0.0–100.0)

## 2020-07-19 LAB — PROTIME-INR
INR: 1.2 (ref 0.8–1.2)
Prothrombin Time: 14.5 seconds (ref 11.4–15.2)

## 2020-07-19 LAB — AMMONIA: Ammonia: <9 umol/L — ABNORMAL LOW (ref 9–35)

## 2020-07-19 LAB — T4, FREE: Free T4: 1.26 ng/dL — ABNORMAL HIGH (ref 0.61–1.12)

## 2020-07-19 MED ORDER — FUROSEMIDE 10 MG/ML IJ SOLN
40.0000 mg | Freq: Once | INTRAMUSCULAR | Status: AC
  Administered 2020-07-19: 40 mg via INTRAVENOUS
  Filled 2020-07-19: qty 4

## 2020-07-19 MED ORDER — ACETAMINOPHEN 325 MG PO TABS
650.0000 mg | ORAL_TABLET | Freq: Once | ORAL | Status: AC
  Administered 2020-07-19: 650 mg via ORAL
  Filled 2020-07-19: qty 2

## 2020-07-19 MED ORDER — IOHEXOL 350 MG/ML SOLN
75.0000 mL | Freq: Once | INTRAVENOUS | Status: AC | PRN
  Administered 2020-07-19: 75 mL via INTRAVENOUS

## 2020-07-19 MED ORDER — LACTATED RINGERS IV BOLUS (SEPSIS)
1000.0000 mL | Freq: Once | INTRAVENOUS | Status: AC
  Administered 2020-07-19: 1000 mL via INTRAVENOUS

## 2020-07-19 MED ORDER — SODIUM CHLORIDE 0.9 % IV SOLN
2.0000 g | Freq: Once | INTRAVENOUS | Status: AC
  Administered 2020-07-19: 2 g via INTRAVENOUS
  Filled 2020-07-19: qty 20

## 2020-07-19 NOTE — ED Triage Notes (Signed)
PT BIB [facility] for stroke-like symptoms x4days.  EMS reports expressive aphasia, HA, and confusion possibly explained by the expressive aphasia.  EMS reports pt staggered on ambulation.  CBG 184.

## 2020-07-19 NOTE — ED Notes (Signed)
Pt to MRI

## 2020-07-19 NOTE — ED Provider Notes (Signed)
[facility] EMERGENCY DEPARTMENT Provider Note   CSN: [id number] Arrival date & time: 07/19/20  1820     History Chief Complaint  Patient presents with  . Altered Mental Status    Escher Henson is a 44 y.o. male.  HPI Pt is a 44 y/o male with a history of DM, HLD, HTN who presents with several days of altered mental status. Per EMS, pt has had about 4 days of expressive aphasia. On review of recent records, pt was seen 07/16/20 for dyspnea and chest pain. Felt likely GERD vs atelectasis with negative troponin x 2, unchanged ECG, and CTA PE showing no thromboembolic disease (after positive dimer).  Was able to reach pt's son, Clement Deneault, at [phone]: Pt has been sick with respiratory symptoms since Monday (3 days ago) He has had trouble talking since Tuesday (2 days ago) - unable to get words out, difficulty following commands Has been complaining of headache, SOB    Past Medical History:  Diagnosis Date  . Diabetes mellitus without complication ([place])   . Hemoglobin A1C greater than 9%, indicating poor diabetic control 12/2019  . Hyperglycemia 12/2019  . Hyperlipidemia 12/2019  . Hypertension   . Hypertensive urgency 12/2019  . Noncompliance with medication regimen 12/2019  . Urine ketones 12/2019  . Vitamin D deficiency 12/2019    Patient Active Problem List   Diagnosis Date Noted  . Noncompliance with medication regimen 01/04/2020  . Hyperglycemia 01/04/2020  . Urine ketones 01/04/2020  . Essential hypertension 01/04/2020  . NICM (nonischemic cardiomyopathy) ([place]) 01/16/2017  . 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 Acute combined systolic and diastolic heart failure ([place])   . Elevated troponin   . CHF (congestive heart failure) ([place]) 01/02/2017  . 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 Diabetes ([place]) 01/02/2017    Past Surgical History:  Procedure Laterality Date  . left knee surgery    . RIGHT/LEFT HEART CATH AND  CORONARY ANGIOGRAPHY N/A 01/05/2017   Procedure: Right/Left Heart Cath and Coronary Angiography;  Surgeon: Leonie Man, MD;  Location: [facility];  Service: Cardiovascular;  Laterality: N/A;       Family History  Problem Relation Age of Onset  . Multiple sclerosis Mother   . Diabetic kidney disease Mother   . Hypertension Mother     Social History   Tobacco Use  . Smoking status: Never Smoker  . Smokeless tobacco: Never Used  Vaping Use  . Vaping Use: Never used  Substance Use Topics  . Alcohol use: No  . Drug use: No    Home Medications Prior to Admission medications   Medication Sig Start Date End Date Taking? Authorizing Provider  amLODipine (NORVASC) 10 MG tablet Take 1 tablet (10 mg total) by mouth daily. 01/04/20   Azzie Glatter, FNP  aspirin 81 MG EC tablet Take 1 tablet (81 mg total) by mouth daily. Patient not taking: Reported on 07/16/2020 10/07/17   Scot Jun, FNP  atorvastatin (LIPITOR) 10 MG tablet Take 1 tablet (10 mg total) by mouth daily. 01/05/20   Azzie Glatter, FNP  blood glucose meter kit and supplies Dispense based on patient and insurance preference. Use up to four times daily for blood glucose readings greater than 125. 06/29/18   Azzie Glatter, FNP  carvedilol (COREG) 12.5 MG tablet Take 1 tablet (12.5 mg total) by mouth 2 (two) times daily with a meal. 01/04/20   Azzie Glatter, FNP  glipiZIDE ([place])  10 MG tablet Take 1 tablet (10 mg total) by mouth 2 (two) times daily before a meal. 01/04/20   Azzie Glatter, FNP  insulin aspart (NOVOLOG) 100 UNIT/ML injection Inject 15 Units into the skin 3 (three) times daily with meals. 06/29/18   Azzie Glatter, FNP  insulin glargine (LANTUS) 100 unit/mL SOPN Inject 0.5 mLs (50 Units total) into the skin at bedtime. 01/04/20   Azzie Glatter, FNP  losartan (COZAAR) 25 MG tablet Take 1 tablet (25 mg total) by mouth daily. 06/29/18 02/24/20  Azzie Glatter, FNP  metFORMIN  (GLUCOPHAGE) 1000 MG tablet Take 1 tablet (1,000 mg total) by mouth 2 (two) times daily with a meal. 01/04/20   Azzie Glatter, FNP  omeprazole (PRILOSEC) 40 MG capsule Take 1 capsule (40 mg total) by mouth daily. Patient not taking: Reported on 07/16/2020 06/29/18   Azzie Glatter, FNP  potassium chloride SA (K-DUR,KLOR-CON) 20 MEQ tablet Take 1 tablet (20 mEq total) by mouth daily. 10/07/17   Scot Jun, FNP  Pseudoephedrine-APAP-DM (DAYQUIL PO) Take 30 mLs by mouth every 6 (six) hours as needed (cold symptoms).    [provider]  Vitamin D, Ergocalciferol, (DRISDOL) 1.25 MG (50000 UNIT) CAPS capsule Take 1 capsule (50,000 Units total) by mouth every 7 (seven) days. Patient taking differently: Take 50,000 Units by mouth every Saturday. 01/05/20   Azzie Glatter, FNP    Allergies    Patient has no known allergies.  Review of Systems   Review of Systems  Unable to perform ROS: Mental status change    Physical Exam Updated Vital Signs BP (!) 169/127 (BP Location: Right Arm)   Pulse (!) 140   Temp (!) 102.2 F (39 C) (Oral)   Resp 20   SpO2 99%   Physical Exam Vitals and nursing note reviewed.  Constitutional:      General: He is not in acute distress.    Appearance: He is well-developed and well-nourished. He is obese. He is not ill-appearing or toxic-appearing.  HENT:     Head: Normocephalic and atraumatic.     Nose: Nose normal.     Mouth/Throat:     Mouth: Mucous membranes are moist.  Eyes:     General: Scleral icterus present.     Extraocular Movements: Extraocular movements intact.  Cardiovascular:     Rate and Rhythm: Regular rhythm. Tachycardia present.     Pulses: Normal pulses.     Heart sounds: No friction rub. No gallop.   Pulmonary:     Effort: Pulmonary effort is normal. No respiratory distress.     Breath sounds: Normal breath sounds. No wheezing, rhonchi or rales.  Abdominal:     Palpations: Abdomen is soft.     Tenderness: There  is abdominal tenderness (RUQ).  Musculoskeletal:        General: No edema.     Cervical back: Neck supple.     Right lower leg: No edema.     Left lower leg: No edema.  Skin:    General: Skin is warm and dry.  Neurological:     Mental Status: He is alert. He is confused.     GCS: GCS eye subscore is 4. GCS verbal subscore is 3. GCS motor subscore is 6.     Cranial Nerves: No facial asymmetry.     Motor: No tremor.     Comments: Expressive aphasia and apraxia. Unable to assess fully for focality. Able to lift all limbs against  gravity. Difficulty following most commands.  Psychiatric:        Mood and Affect: Mood and affect normal.     ED Results / Procedures / Treatments   Labs (all labs ordered are listed, but only abnormal results are displayed) Labs Reviewed  CBG MONITORING, ED - Abnormal; Notable for the following components:      Result Value   Glucose-Capillary 187 (*)    All other components within normal limits  RESP PANEL BY RT-PCR (FLU A&B, COVID) [id number]    EKG EKG Interpretation  Date/Time:  Thursday July 19 2020 18:30:09 EST Ventricular Rate:  143 PR Interval:    QRS Duration: 96 QT Interval:  380 QTC Calculation: 587 R Axis:   46 Text Interpretation: Sinus or ectopic atrial tachycardia Nonspecific T abnrm, anterolateral leads Prolonged QT interval Confirmed by Lennice Sites [phone]) on 07/19/2020 7:06:40 PM   Radiology CT Angio Head W or Wo Contrast  Result Date: 07/19/2020 CLINICAL DATA:  Stroke-like symptoms. Expressive aphasia, headache, confusion EXAM: CT ANGIOGRAPHY HEAD AND NECK TECHNIQUE: Multidetector CT imaging of the head and neck was performed using the standard protocol during bolus administration of intravenous contrast. Multiplanar CT image reconstructions and MIPs were obtained to evaluate the vascular anatomy. Carotid stenosis measurements (when applicable) are obtained utilizing NASCET criteria, using the distal internal carotid  diameter as the denominator. CONTRAST:  30m OMNIPAQUE IOHEXOL 350 MG/ML SOLN COMPARISON:  CT head 07/19/2020 FINDINGS: CTA NECK FINDINGS Aortic arch: Normal aortic arch and proximal great vessels. Bovine branching pattern. Right carotid system: Normal right carotid. Negative for stenosis or dissection. Left carotid system: Normal left carotid. Negative for stenosis or dissection. Vertebral arteries: Both vertebral arteries are normal and patent to the basilar. Right vertebral artery dominant. Skeleton: No acute skeletal abnormality. Other neck: Negative for mass or adenopathy in the neck. Upper chest: Small bilateral effusions. Patchy nodular airspace disease left upper lobe, not present on CT chest 07/17/2020. Possible pneumonia or edema. Review of the MIP images confirms the above findings CTA HEAD FINDINGS Anterior circulation: Normal cavernous carotid. Right anterior and middle cerebral arteries widely patent. Left anterior cerebral artery patent. Left M1 segment widely patent. Left MCA bifurcation normal. No large vessel occlusion. There is an area of hypoperfusion in the left parietal lobe corresponding to the hypodensity. This is consistent with recent infarct with distal MCA occlusion Posterior circulation: Both vertebral arteries patent to the basilar. PICA patent bilaterally. Basilar widely patent. Posterior circulation normal without stenosis or large vessel occlusion. Venous sinuses: Normal venous enhancement. Anatomic variants: None Review of the MIP images confirms the above findings IMPRESSION: 1. Normal carotid and vertebral arteries in the neck 2. No intracranial large vessel occlusion. There is a small area of hypoperfusion in the left parietal lobe consistent with subacute infarct. This area shows hypodensity on CT. Electronically Signed   By: CFranchot GalloM.D.   On: 07/19/2020 20:58   CT Head Wo Contrast  Result Date: 07/19/2020 CLINICAL DATA:  Altered mental status. Stroke-like symptoms  for 4 days. Expressive aphasia, headache, and confusion. EXAM: CT HEAD WITHOUT CONTRAST TECHNIQUE: Contiguous axial images were obtained from the base of the skull through the vertex without intravenous contrast. COMPARISON:  None. FINDINGS: Brain: Poorly defined area of low attenuation change extending to the cortical surface and involving the left posterior parietal lobe. This is consistent with acute or subacute infarct. Mild mass effect with effacement of sulci and left lateral ventricle. No midline shift. No abnormal extra-axial fluid collections.  No acute intracranial hemorrhage. No ventricular dilatation. Vascular: Mild intracranial arterial vascular calcifications. Skull: Calvarium appears intact. Sinuses/Orbits: Paranasal sinuses and mastoid air cells are clear. Other: None. IMPRESSION: Low-attenuation in the left posterior parietal lobe consistent with acute or subacute infarct. Mild mass effect without midline shift. No acute intracranial hemorrhage. Electronically Signed   By: Lucienne Capers M.D.   On: 07/19/2020 19:36   CT Angio Neck W and/or Wo Contrast  Result Date: 07/19/2020 CLINICAL DATA:  Stroke-like symptoms. Expressive aphasia, headache, confusion EXAM: CT ANGIOGRAPHY HEAD AND NECK TECHNIQUE: Multidetector CT imaging of the head and neck was performed using the standard protocol during bolus administration of intravenous contrast. Multiplanar CT image reconstructions and MIPs were obtained to evaluate the vascular anatomy. Carotid stenosis measurements (when applicable) are obtained utilizing NASCET criteria, using the distal internal carotid diameter as the denominator. CONTRAST:  71m OMNIPAQUE IOHEXOL 350 MG/ML SOLN COMPARISON:  CT head 07/19/2020 FINDINGS: CTA NECK FINDINGS Aortic arch: Normal aortic arch and proximal great vessels. Bovine branching pattern. Right carotid system: Normal right carotid. Negative for stenosis or dissection. Left carotid system: Normal left carotid.  Negative for stenosis or dissection. Vertebral arteries: Both vertebral arteries are normal and patent to the basilar. Right vertebral artery dominant. Skeleton: No acute skeletal abnormality. Other neck: Negative for mass or adenopathy in the neck. Upper chest: Small bilateral effusions. Patchy nodular airspace disease left upper lobe, not present on CT chest 07/17/2020. Possible pneumonia or edema. Review of the MIP images confirms the above findings CTA HEAD FINDINGS Anterior circulation: Normal cavernous carotid. Right anterior and middle cerebral arteries widely patent. Left anterior cerebral artery patent. Left M1 segment widely patent. Left MCA bifurcation normal. No large vessel occlusion. There is an area of hypoperfusion in the left parietal lobe corresponding to the hypodensity. This is consistent with recent infarct with distal MCA occlusion Posterior circulation: Both vertebral arteries patent to the basilar. PICA patent bilaterally. Basilar widely patent. Posterior circulation normal without stenosis or large vessel occlusion. Venous sinuses: Normal venous enhancement. Anatomic variants: None Review of the MIP images confirms the above findings IMPRESSION: 1. Normal carotid and vertebral arteries in the neck 2. No intracranial large vessel occlusion. There is a small area of hypoperfusion in the left parietal lobe consistent with subacute infarct. This area shows hypodensity on CT. Electronically Signed   By: CFranchot GalloM.D.   On: 07/19/2020 20:58   DG Chest Portable 1 View  Result Date: 07/19/2020 CLINICAL DATA:  Fever, altered level of consciousness EXAM: PORTABLE CHEST 1 VIEW COMPARISON:  07/17/2020 FINDINGS: Single frontal view of the chest demonstrates an enlarged cardiac silhouette. Increased central vascular congestion, with interval development of bilateral interstitial prominence. No large effusion or pneumothorax. No acute bony abnormalities. IMPRESSION: 1. Worsening volume status,  with mild interstitial edema on today's exam. Electronically Signed   By: MRanda NgoM.D.   On: 07/19/2020 18:49    Procedures Procedures (including critical care time)  Medications Ordered in ED Medications - No data to display  ED Course  I have reviewed the triage vital signs and the nursing notes.  Pertinent labs & imaging results that were available during my care of the patient were reviewed by me and considered in my medical decision making (see chart for details).    MDM Rules/Calculators/A&P                         44y/o male with aphasia. Also likely  dyspnea, although pt cannot endorse this on exam. He is hemodynamically stable but febrile and tachycardic on arrival. I am most concerned for delirium secondary to urinary infection as pt keeps motioning to his abdomen and groin, but differential includes stroke, meningitis, encephalitis, thyroid pathology, intoxication, electrolyte derangement. Lower suspicion for meningitis in this pt who is moving his neck freely; however will provide initial coverage for sepsis 2/2 meningitis, with 2g rocephin, 1L LR bolus, Tylenol.  Labs reviewed and notable for mild troponinemia, elevated BNP, positive SARS-CoV-2, lactic acidemia, slightly elevated creatinine, elevated free T4. ECG shows Sinus tachycardia with a rate of 143.  QRS 96, QTc 587.  There is no STEMI.  There are nonspecific ST changes in the lateral leads. Imaging interpreted by radiology and images personally reviewed.  CXR with interstitial edema increased from prior.  CT head shows likely acute versus subacute infarct in the left posterior parietal lobe. CTA head/neck shows hypoperfusion in L parietal lobe c/w subacute infarct.  Pt has remained stable throughout ED course. Suspect that his vital sign abnormalities and speech changes can be explained separately by his respiratory infection and CVA. He will require admission for further evaluation and management. Pt will be  admitted to the Hospitalist service.  Final Clinical Impression(s) / ED Diagnoses Final diagnoses:  Altered mental status, unspecified altered mental status type  Cerebrovascular accident (CVA), unspecified mechanism ([place])  [id number]     Vanna Scotland, MD 76/70/11 [id number]    [place], [place] Amari Zagal, DO 07/23/20 [phone]

## 2020-07-19 NOTE — Consult Note (Addendum)
Neurology Consultation  Reason for Consult: Stroke Referring Physician: Dr. Lennice Sites, ED provider  CC: Altered mental status  History is obtained from: Chart review  HPI: Rodney Kane is a 44 y.o. male past history of diabetes, hypertension, hyperlipidemia, noncompliance to medications, along with nonischemic cardiomyopathy-presented to the emergency room for evaluation of confusion and altered mental status.  He had been seen 3 days ago with shortness of breath, chest pain and had an unremarkable work-up including CT chest with no PE and a negative Covid test.  He appears to aphasic today and that is why a CT head was done that showed a left MCA territory subacute infarct.  Last known well somewhere 2 days ago. Interestingly, he also tested positive for COVID-19 today. His EKG was questionable for atrial flutter versus atrial fibrillation-ED provider checked with cardiology who thinks this is likely sinus tachycardia. Chest x-ray with some concern for volume overload. BNP 1600 Patient unable to provide reliable history at this time   LKW: 2 days ago tpa given?: no, outside the window Premorbid modified Rankin scale (mRS): Presumably 0-no way to double check at this time  ROS: Unable to obtain due to his aphasia  Past Medical History:  Diagnosis Date  . Diabetes mellitus without complication ([place])   . Hemoglobin A1C greater than 9%, indicating poor diabetic control 12/2019  . Hyperglycemia 12/2019  . Hyperlipidemia 12/2019  . Hypertension   . Hypertensive urgency 12/2019  . Noncompliance with medication regimen 12/2019  . Urine ketones 12/2019  . Vitamin D deficiency 12/2019    Family History  Problem Relation Age of Onset  . Multiple sclerosis Mother   . Diabetic kidney disease Mother   . Hypertension Mother    Social History:   reports that he has never smoked. He has never used smokeless tobacco. He reports that he does not drink alcohol and does not use  drugs.  Medications  Current Facility-Administered Medications:  .  insulin lispro (HUMALOG) injection 20 Units, 20 Units, Subcutaneous, Once, Azzie Glatter, FNP  Current Outpatient Medications:  .  amLODipine (NORVASC) 10 MG tablet, Take 1 tablet (10 mg total) by mouth daily., Disp: 90 tablet, Rfl: 3 .  atorvastatin (LIPITOR) 10 MG tablet, Take 1 tablet (10 mg total) by mouth daily., Disp: 90 tablet, Rfl: 3 .  carvedilol (COREG) 12.5 MG tablet, Take 1 tablet (12.5 mg total) by mouth 2 (two) times daily with a meal., Disp: 180 tablet, Rfl: 3 .  glipiZIDE (GLUCOTROL) 10 MG tablet, Take 1 tablet (10 mg total) by mouth 2 (two) times daily before a meal., Disp: 180 tablet, Rfl: 3 .  insulin aspart (NOVOLOG) 100 UNIT/ML injection, Inject 15 Units into the skin 3 (three) times daily with meals., Disp: 10 mL, Rfl: 11 .  insulin glargine (LANTUS) 100 unit/mL SOPN, Inject 0.5 mLs (50 Units total) into the skin at bedtime., Disp: 15 mL, Rfl: 11 .  metFORMIN (GLUCOPHAGE) 1000 MG tablet, Take 1 tablet (1,000 mg total) by mouth 2 (two) times daily with a meal., Disp: 180 tablet, Rfl: 3 .  potassium chloride SA (K-DUR,KLOR-CON) 20 MEQ tablet, Take 1 tablet (20 mEq total) by mouth daily., Disp: 30 tablet, Rfl: 3 .  Pseudoephedrine-APAP-DM (DAYQUIL PO), Take 30 mLs by mouth every 6 (six) hours as needed (cold symptoms)., Disp: , Rfl:  .  Vitamin D, Ergocalciferol, (DRISDOL) 1.25 MG (50000 UNIT) CAPS capsule, Take 1 capsule (50,000 Units total) by mouth every 7 (seven) days. (Patient taking differently: Take  50,000 Units by mouth every Saturday.), Disp: 5 capsule, Rfl: 5 .  aspirin 81 MG EC tablet, Take 1 tablet (81 mg total) by mouth daily. (Patient not taking: No sig reported), Disp: 30 tablet, Rfl: 0 .  blood glucose meter kit and supplies, Dispense based on patient and insurance preference. Use up to four times daily for blood glucose readings greater than 125., Disp: 1 each, Rfl: 0 .  losartan (COZAAR)  25 MG tablet, Take 1 tablet (25 mg total) by mouth daily., Disp: 30 tablet, Rfl: 3 .  omeprazole (PRILOSEC) 40 MG capsule, Take 1 capsule (40 mg total) by mouth daily. (Patient not taking: No sig reported), Disp: 30 capsule, Rfl: 3   Exam: Current vital signs: BP (!) 155/106   Pulse (!) 127   Temp 98.8 F (37.1 C) (Oral)   Resp 12   SpO2 99%  Vital signs in last 24 hours: Temp:  [98.8 F (37.1 C)-102.2 F (39 C)] 98.8 F (37.1 C) (12/30 2305) Pulse Rate:  [127-140] 127 (12/30 2200) Resp:  [12-20] 12 (12/30 2200) BP: (139-169)/(98-127) 155/106 (12/30 2200) SpO2:  [97 %-99 %] 99 % (12/30 2200) General: Awake alert in no distress HEENT: Normocephalic, atraumatic, dry mucous membranes Lungs: Clear Cardiovascular: RRR, although monitor at times did reveal a flutter Extremities warm well perfused Abdomen soft nondistended nontender Neurological exam Awake, alert Upon asking him any questions, it seems like he is having difficulty getting his words out and appears very frustrated. He is not able to follow simple verbal commands but is able to mimic some actions. Interestingly during the encounter at 1 point, he said a complete sentence saying "I want to get up" and pointed out the urinal to indicate that he had to urinate. No dysarthria Cranial nerves II to XII intact.  No visual field deficits, no facial asymmetry Motor exam: 5/5 in all fours Sensation intact Coordination difficult to assess due to his inability to follow commands Gait testing deferred at this time NIH stroke scale 1a Level of Conscious.: 0 1b LOC Questions: 2 1c LOC Commands: 1 2 Best Gaze: 0 3 Visual: 0 4 Facial Palsy: 0 5a Motor Arm - left: 0 5b Motor Arm - Right: 0 6a Motor Leg - Left: 0 6b Motor Leg - Right: 0 7 Limb Ataxia: 0 8 Sensory: 0 9 Best Language: 2 10 Dysarthria: 0 11 Extinct. and Inatten.: 1 TOTAL: 6  Labs I have reviewed labs in epic and the results pertinent to this consultation  are:  CBC    Component Value Date/Time   WBC 6.5 07/19/2020 1853   RBC 5.58 07/19/2020 1853   HGB 15.3 07/19/2020 1921   HGB 14.8 06/29/2018 0944   HCT 45.0 07/19/2020 1921   HCT 45.6 06/29/2018 0944   PLT 242 07/19/2020 1853   PLT 264 06/29/2018 0944   MCV 79.2 (L) 07/19/2020 1853   MCV 80 06/29/2018 0944   MCH 25.3 (L) 07/19/2020 1853   MCHC 31.9 07/19/2020 1853   RDW 12.8 07/19/2020 1853   RDW 11.8 (L) 06/29/2018 0944   LYMPHSABS 0.8 07/19/2020 1853   LYMPHSABS 2.2 06/29/2018 0944   MONOABS 0.7 07/19/2020 1853   EOSABS 0.0 07/19/2020 1853   EOSABS 0.2 06/29/2018 0944   BASOSABS 0.0 07/19/2020 1853   BASOSABS 0.1 06/29/2018 0944    CMP     Component Value Date/Time   NA 133 (L) 07/19/2020 1921   NA 129 (L) 06/29/2018 0944   K 3.9 07/19/2020 1921  CL 94 (L) 07/19/2020 1853   CO2 21 (L) 07/19/2020 1853   GLUCOSE 182 (H) 07/19/2020 1853   BUN 15 07/19/2020 1853   BUN 16 06/29/2018 0944   CREATININE 1.19 07/19/2020 1853   CALCIUM 9.0 07/19/2020 1853   PROT 7.4 07/19/2020 1853   PROT 7.3 06/29/2018 0944   ALBUMIN 3.6 07/19/2020 1853   ALBUMIN 4.4 06/29/2018 0944   AST 22 07/19/2020 1853   ALT 21 07/19/2020 1853   ALKPHOS 48 07/19/2020 1853   BILITOT 1.0 07/19/2020 1853   BILITOT 0.3 06/29/2018 0944   GFRNONAA >60 07/19/2020 1853   GFRAA >60 02/24/2020 0747    Imaging I have reviewed the images obtained:  CT-scan of the brain-subacute left MCA stroke, question some petechial blood.   CT angiogram head and neck with normal carotid and vertebral arteries in the neck, no intracranial large vessel occlusion.  Small area of hypoperfusion in the left parietal lobe consistent with a subacute infarct-this area shows hypodensity on CT.   Assessment:  44 year old man with multiple cerebrovascular risk factors and noncompliance to medications-diabetes, hypertension, hyperlipidemia, nonischemic cardiomyopathy-presents for evaluation of altered mental status and has  subacute left MCA territory infarct. Head and neck vessel imaging unremarkable for LVO Outside the window for IV TPA and also for any emergent endovascular intervention given no LVO. Etiology remains elusive at this time. He is Covid positive today.   Impression: -Acute ischemic stroke-etiology under investigation.  Could include large vessel versus embolic versus [id number] related -COVID-19 positive  Recommendations: Admit to hospitalist Telemetry Frequent neurochecks MRI brain without contrast 2D echo Might need TEE and loop recorder placement A1c Lipid panel PT OT Speech therapy Bedside swallow evaluation-n.p.o. until clears bedside stroke screen versus formal swallow evaluation. Last known well was 2 days ago.  Can give him another day of permissive hypertension and treat only if systolic is greater than 491.  After that start normalizing blood pressures. Check urinary drug screen  Will hold aspirin for now given the question of petechial blood.  Plan discussed with Dr. Ronnald Nian in the emergency room.  I will also relate to Dr. Marlowe Sax via secure chat.  -- Amie Portland, MD [facility] Pager: [phone] If 7pm to 7am, please call on call as listed on AMION.

## 2020-07-19 NOTE — ED Provider Notes (Signed)
I have personally seen and examined the patient. I have reviewed the documentation on PMH/FH/Soc Hx. I have discussed the plan of care with the resident and patient.  I have reviewed and agree with the resident's documentation. Please see associated encounter note.  Briefly, the patient is a 44 y.o. male here with confusion, altered mental status.  Patient with history of nonischemic cardiomyopathy.  Patient arrives tachycardic to the 140s, febrile to 102.  Was seen here several days ago for chest pain or shortness of breath and had unremarkable work-up including CT scan of his chest.  No PE.  Did not have Covid at that time.  Today he appears kind of aphasic and confused and difficult to examine.  No focal neurological findings.  Supposedly per family per EMS patient has had difficulty with speaking the last several days.  Overall neurological exam is nonfocal.  Does not appear to have any symptoms consistent with meningitis.  However he is febrile and tachycardic and sepsis work-up initiated.  We will get a head CT and may need to consider LP.  Will give a dose of Rocephin.  Will give fluid bolus but patient is normotensive and will not do 30 cc/kg of fluid.  Overall patient with concerning sign on CT scan for acute/subacute stroke.  Dr. Jerrell Belfast with neurology was consulted and recommends admission for further stroke work-up with MRI and CTA of head and neck.  Talked with Dr. Mayford Knife with cardiology as EKG was questionable for may be in a flutter versus A. fib.  She thinks likely a sinus tachycardia.  Will give Tylenol and fluids and see if we can get the heart rate to slow down enough to repeat EKG.  May consider diltiazem.  Otherwise patient with chest x-ray consistent with may be volume overload.  BNP 1600.  Troponin mildly elevated.  However patient with no respiratory distress.  Patient is on room air.  Patient did test positive for Covid which is likely the source of the fever.  Overall patient fairly ill  with Covid and now with stroke.  Will admit to medicine service for further care.   EKG Interpretation  Date/Time:  Thursday July 19 2020 18:30:09 EST Ventricular Rate:  143 PR Interval:    QRS Duration: 96 QT Interval:  380 QTC Calculation: 587 R Axis:   46 Text Interpretation: Sinus or ectopic atrial tachycardia Nonspecific T abnrm, anterolateral leads Prolonged QT interval Confirmed by Virgina Norfolk [phone]) on 07/19/2020 7:06:40 PM      .Critical Care Performed by: Virgina Norfolk, DO Authorized by: Virgina Norfolk, DO   Critical care provider statement:    Critical care time (minutes):  45   Critical care was necessary to treat or prevent imminent or life-threatening deterioration of the following conditions:  CNS failure or compromise and sepsis   Critical care was time spent personally by me on the following activities:  Blood draw for specimens, development of treatment plan with patient or surrogate, discussions with consultants, discussions with primary provider, evaluation of patient's response to treatment, examination of patient, obtaining history from patient or surrogate, ordering and performing treatments and interventions, ordering and review of laboratory studies, ordering and review of radiographic studies, pulse oximetry, re-evaluation of patient's condition and review of old charts   I assumed direction of critical care for this patient from another provider in my specialty: no        Virgina Norfolk, DO 07/19/20 2114

## 2020-07-19 NOTE — ED Notes (Signed)
Pt aware of need for urine sample. Pt attempted to urinate but produced less than 36mL.

## 2020-07-19 NOTE — H&P (Addendum)
History and Physical    Rodney Kane DZH:[id number] DOB: [birth year] DOA: 07/19/2020  PCP: Rodney Glatter, FNP Patient coming from: Home  Chief Complaint: Expressive aphasia, staggering gait  HPI: Rodney Kane is a 44 y.o. 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  Family reported to EMS that patient has had difficulty speaking for the last several days.  Unable to obtain any history from patient at this time due to his expressive aphasia.  No family bedside. Of note, patient was seen in the ED several days ago for chest pain and shortness of breath.  He had an unremarkable work-up at that time including CT of his chest showing no PE.  ED Course: Febrile with temperature 102.2 F.  Tachycardic with heart rate in the 140s.  Not hypotensive.  Not tachypneic or hypoxic.  SARS-CoV-2 PCR test positive.  Influenza panel negative.  WBC 6.5, hemoglobin 14.1, hematocrit 44.2, platelet count 242K.  Sodium 130, potassium 3.8, chloride 94, bicarb 21, BUN 15, creatinine 1.1, glucose 182.  LFTs normal.  Lipase normal.  Lactic acid 2.2 >2.6.  INR 1.2.  TSH normal.  Free T4 borderline elevated at 1.26.  Magnesium within normal range.  RPR pending.  Ammonia level <9.  Blood ethanol level undetectable.  UA and urine culture pending.  UDS pending.  Blood culture x2 pending.  Initial high-sensitivity troponin 45, repeat pending.  BNP significantly elevated at 1619.  VBG with pH 7.48.  Triglycerides within normal range.  Ferritin, fibrinogen, LDH, CRP, D-dimer, procalcitonin pending.  CT head showing acute/subacute infarct in the left posterior parietal lobe.  Mild mass-effect without midline shift.  No acute intracranial hemorrhage.  ED provider  discussed the case with Dr. Rory Kane from neurology who recommended admission for stroke work-up with MRI and CTA of head and neck. CTA head and neck negative for LVO.  Brain MRI pending.  Chest x-ray showing cardiomegaly and pulmonary vascular congestion.  Given persistent tachycardia there is question for a flutter versus A. fib.  However, ED provider spoke to Dr. Radford Kane who reviewed the patient's EKG and felt that it was showing sinus tachycardia.  Patient received Tylenol, ceftriaxone, and 1 L LR bolus initially for sepsis.  He was later given a dose of IV Lasix 40 mg for volume overload.  Review of Systems:  All systems reviewed and apart from history of presenting illness, are negative.  Past Medical History:  Diagnosis Date  . Diabetes mellitus without complication ([place])   . Hemoglobin A1C greater than 9%, indicating poor diabetic control 12/2019  . Hyperglycemia 12/2019  . Hyperlipidemia 12/2019  . Hypertension   . Hypertensive urgency 12/2019  . Noncompliance with medication regimen 12/2019  . Urine ketones 12/2019  . Vitamin D deficiency 12/2019    Past Surgical History:  Procedure Laterality Date  . left knee surgery    . RIGHT/LEFT HEART CATH AND CORONARY ANGIOGRAPHY N/A 01/05/2017   Procedure: Right/Left Heart Cath and Coronary Angiography;  Surgeon: Rodney Man, MD;  Location: [facility];  Service: Cardiovascular;  Laterality: N/A;     reports that he has never smoked. He has never used smokeless tobacco. He reports that he does not drink alcohol and does not use drugs.  No Known Allergies  Family History  Problem Relation Age of Onset  .  Multiple sclerosis Mother   . Diabetic kidney disease Mother   . Hypertension Mother     Prior to Admission medications   Medication Sig Start Date End Date Taking? Authorizing Provider  amLODipine (NORVASC) 10 MG tablet Take 1 tablet (10 mg total) by mouth daily. 01/04/20  Yes Rodney Glatter, FNP  atorvastatin  (LIPITOR) 10 MG tablet Take 1 tablet (10 mg total) by mouth daily. 01/05/20  Yes Rodney Glatter, FNP  carvedilol (COREG) 12.5 MG tablet Take 1 tablet (12.5 mg total) by mouth 2 (two) times daily with a meal. 01/04/20  Yes Rodney Glatter, FNP  glipiZIDE (GLUCOTROL) 10 MG tablet Take 1 tablet (10 mg total) by mouth 2 (two) times daily before a meal. 01/04/20  Yes Rodney Glatter, FNP  insulin aspart (NOVOLOG) 100 UNIT/ML injection Inject 15 Units into the skin 3 (three) times daily with meals. 06/29/18  Yes Rodney Glatter, FNP  insulin glargine (LANTUS) 100 unit/mL SOPN Inject 0.5 mLs (50 Units total) into the skin at bedtime. 01/04/20  Yes Rodney Glatter, FNP  metFORMIN (GLUCOPHAGE) 1000 MG tablet Take 1 tablet (1,000 mg total) by mouth 2 (two) times daily with a meal. 01/04/20  Yes Rodney Glatter, FNP  potassium chloride SA (K-DUR,KLOR-CON) 20 MEQ tablet Take 1 tablet (20 mEq total) by mouth daily. 10/07/17  Yes Rodney Jun, FNP  Pseudoephedrine-APAP-DM (DAYQUIL PO) Take 30 mLs by mouth every 6 (six) hours as needed (cold symptoms).   Yes [provider]  Vitamin D, Ergocalciferol, (DRISDOL) 1.25 MG (50000 UNIT) CAPS capsule Take 1 capsule (50,000 Units total) by mouth every 7 (seven) days. Patient taking differently: Take 50,000 Units by mouth every Saturday. 01/05/20  Yes Rodney Glatter, FNP  aspirin 81 MG EC tablet Take 1 tablet (81 mg total) by mouth daily. Patient not taking: No sig reported 10/07/17   Rodney Jun, FNP  blood glucose meter kit and supplies Dispense based on patient and insurance preference. Use up to four times daily for blood glucose readings greater than 125. 06/29/18   Rodney Glatter, FNP  losartan (COZAAR) 25 MG tablet Take 1 tablet (25 mg total) by mouth daily. 06/29/18 02/24/20  Rodney Glatter, FNP  omeprazole (PRILOSEC) 40 MG capsule Take 1 capsule (40 mg total) by mouth daily. Patient not taking: No sig reported 06/29/18   Rodney Glatter, FNP    Physical Exam: Vitals:   07/19/20 1824 07/19/20 2005 07/19/20 2200 07/19/20 2305  BP: (!) 169/127 (!) 139/98 (!) 155/106   Pulse: (!) 140 (!) 140 (!) 127   Resp: 20 20 12    Temp: (!) 102.2 F (39 C)   98.8 F (37.1 C)  TempSrc: Oral   Oral  SpO2: 99% 97% 99%     Physical Exam Constitutional:      General: He is not in acute distress. HENT:     Head: Normocephalic and atraumatic.  Eyes:     Extraocular Movements: Extraocular movements intact.     Conjunctiva/sclera: Conjunctivae normal.  Cardiovascular:     Rate and Rhythm: Normal rate and regular rhythm.     Pulses: Normal pulses.  Pulmonary:     Effort: Pulmonary effort is normal.     Breath sounds: Rales present. No wheezing.     Comments: Bibasilar rales Abdominal:     General: Bowel sounds are normal. There is no distension.     Palpations: Abdomen is soft.     Tenderness:  There is no abdominal tenderness.  Musculoskeletal:        General: No swelling or tenderness.     Cervical back: Normal range of motion and neck supple.  Skin:    General: Skin is warm and dry.  Neurological:     Mental Status: He is alert.     Comments: Expressive aphasia No focal motor weakness.     Labs on Admission: I have personally reviewed following labs and imaging studies  CBC: Recent Labs  Lab 07/16/20 1634 07/19/20 1853 07/19/20 1921  WBC 4.8 6.5  --   NEUTROABS  --  5.0  --   HGB 13.2 14.1 15.3  HCT 41.2 44.2 45.0  MCV 81.7 79.2*  --   PLT 281 242  --    Basic Metabolic Panel: Recent Labs  Lab 07/16/20 1634 07/19/20 1853 07/19/20 1921  NA 141 130* 133*  K 4.0 3.8 3.9  CL 105 94*  --   CO2 27 21*  --   GLUCOSE 141* 182*  --   BUN 14 15  --   CREATININE 1.02 1.19  --   CALCIUM 9.1 9.0  --   MG  --  1.8  --    GFR: Estimated Creatinine Clearance: 105.9 mL/min (by C-G formula based on SCr of 1.19 mg/dL). Liver Function Tests: Recent Labs  Lab 07/19/20 1853  AST 22  ALT 21  ALKPHOS  48  BILITOT 1.0  PROT 7.4  ALBUMIN 3.6   Recent Labs  Lab 07/19/20 1853  LIPASE 16   Recent Labs  Lab 07/19/20 1856  AMMONIA <9*   Coagulation Profile: Recent Labs  Lab 07/19/20 1853  INR 1.2   Cardiac Enzymes: No results for input(s): CKTOTAL, CKMB, CKMBINDEX, TROPONINI in the last 168 hours. BNP (last 3 results) No results for input(s): PROBNP in the last 8760 hours. HbA1C: No results for input(s): HGBA1C in the last 72 hours. CBG: Recent Labs  Lab 07/19/20 1822  GLUCAP 187*   Lipid Profile: Recent Labs    07/19/20 2103  TRIG 54   Thyroid Function Tests: Recent Labs    07/19/20 1853 07/19/20 1855  TSH  --  1.447  FREET4 1.26*  --    Anemia Panel: Recent Labs    07/19/20 2107  FERRITIN 933*   Urine analysis:    Component Value Date/Time   COLORURINE YELLOW 02/23/2020 2104   APPEARANCEUR HAZY (A) 02/23/2020 2104   LABSPEC 1.029 02/23/2020 2104   PHURINE 5.0 02/23/2020 2104   GLUCOSEU >=500 (A) 02/23/2020 2104   HGBUR SMALL (A) 02/23/2020 2104   BILIRUBINUR NEGATIVE 02/23/2020 2104   BILIRUBINUR neg 01/04/2020 1333   KETONESUR NEGATIVE 02/23/2020 2104   PROTEINUR NEGATIVE 02/23/2020 2104   UROBILINOGEN 0.2 01/04/2020 1333   UROBILINOGEN 0.2 10/07/2017 1158   NITRITE NEGATIVE 02/23/2020 2104   LEUKOCYTESUR NEGATIVE 02/23/2020 2104    Radiological Exams on Admission: CT Angio Head W or Wo Contrast  Result Date: 07/19/2020 CLINICAL DATA:  Stroke-like symptoms. Expressive aphasia, headache, confusion EXAM: CT ANGIOGRAPHY HEAD AND NECK TECHNIQUE: Multidetector CT imaging of the head and neck was performed using the standard protocol during bolus administration of intravenous contrast. Multiplanar CT image reconstructions and MIPs were obtained to evaluate the vascular anatomy. Carotid stenosis measurements (when applicable) are obtained utilizing NASCET criteria, using the distal internal carotid diameter as the denominator. CONTRAST:  84m  OMNIPAQUE IOHEXOL 350 MG/ML SOLN COMPARISON:  CT head 07/19/2020 FINDINGS: CTA NECK FINDINGS Aortic arch: Normal aortic  arch and proximal great vessels. Bovine branching pattern. Right carotid system: Normal right carotid. Negative for stenosis or dissection. Left carotid system: Normal left carotid. Negative for stenosis or dissection. Vertebral arteries: Both vertebral arteries are normal and patent to the basilar. Right vertebral artery dominant. Skeleton: No acute skeletal abnormality. Other neck: Negative for mass or adenopathy in the neck. Upper chest: Small bilateral effusions. Patchy nodular airspace disease left upper lobe, not present on CT chest 07/17/2020. Possible pneumonia or edema. Review of the MIP images confirms the above findings CTA HEAD FINDINGS Anterior circulation: Normal cavernous carotid. Right anterior and middle cerebral arteries widely patent. Left anterior cerebral artery patent. Left M1 segment widely patent. Left MCA bifurcation normal. No large vessel occlusion. There is an area of hypoperfusion in the left parietal lobe corresponding to the hypodensity. This is consistent with recent infarct with distal MCA occlusion Posterior circulation: Both vertebral arteries patent to the basilar. PICA patent bilaterally. Basilar widely patent. Posterior circulation normal without stenosis or large vessel occlusion. Venous sinuses: Normal venous enhancement. Anatomic variants: None Review of the MIP images confirms the above findings IMPRESSION: 1. Normal carotid and vertebral arteries in the neck 2. No intracranial large vessel occlusion. There is a small area of hypoperfusion in the left parietal lobe consistent with subacute infarct. This area shows hypodensity on CT. Electronically Signed   By: Franchot Gallo M.D.   On: 07/19/2020 20:58   CT Head Wo Contrast  Result Date: 07/19/2020 CLINICAL DATA:  Altered mental status. Stroke-like symptoms for 4 days. Expressive aphasia, headache, and  confusion. EXAM: CT HEAD WITHOUT CONTRAST TECHNIQUE: Contiguous axial images were obtained from the base of the skull through the vertex without intravenous contrast. COMPARISON:  None. FINDINGS: Brain: Poorly defined area of low attenuation change extending to the cortical surface and involving the left posterior parietal lobe. This is consistent with acute or subacute infarct. Mild mass effect with effacement of sulci and left lateral ventricle. No midline shift. No abnormal extra-axial fluid collections. No acute intracranial hemorrhage. No ventricular dilatation. Vascular: Mild intracranial arterial vascular calcifications. Skull: Calvarium appears intact. Sinuses/Orbits: Paranasal sinuses and mastoid air cells are clear. Other: None. IMPRESSION: Low-attenuation in the left posterior parietal lobe consistent with acute or subacute infarct. Mild mass effect without midline shift. No acute intracranial hemorrhage. Electronically Signed   By: Lucienne Capers M.D.   On: 07/19/2020 19:36   CT Angio Neck W and/or Wo Contrast  Result Date: 07/19/2020 CLINICAL DATA:  Stroke-like symptoms. Expressive aphasia, headache, confusion EXAM: CT ANGIOGRAPHY HEAD AND NECK TECHNIQUE: Multidetector CT imaging of the head and neck was performed using the standard protocol during bolus administration of intravenous contrast. Multiplanar CT image reconstructions and MIPs were obtained to evaluate the vascular anatomy. Carotid stenosis measurements (when applicable) are obtained utilizing NASCET criteria, using the distal internal carotid diameter as the denominator. CONTRAST:  78m OMNIPAQUE IOHEXOL 350 MG/ML SOLN COMPARISON:  CT head 07/19/2020 FINDINGS: CTA NECK FINDINGS Aortic arch: Normal aortic arch and proximal great vessels. Bovine branching pattern. Right carotid system: Normal right carotid. Negative for stenosis or dissection. Left carotid system: Normal left carotid. Negative for stenosis or dissection. Vertebral  arteries: Both vertebral arteries are normal and patent to the basilar. Right vertebral artery dominant. Skeleton: No acute skeletal abnormality. Other neck: Negative for mass or adenopathy in the neck. Upper chest: Small bilateral effusions. Patchy nodular airspace disease left upper lobe, not present on CT chest 07/17/2020. Possible pneumonia or edema. Review of the  MIP images confirms the above findings CTA HEAD FINDINGS Anterior circulation: Normal cavernous carotid. Right anterior and middle cerebral arteries widely patent. Left anterior cerebral artery patent. Left M1 segment widely patent. Left MCA bifurcation normal. No large vessel occlusion. There is an area of hypoperfusion in the left parietal lobe corresponding to the hypodensity. This is consistent with recent infarct with distal MCA occlusion Posterior circulation: Both vertebral arteries patent to the basilar. PICA patent bilaterally. Basilar widely patent. Posterior circulation normal without stenosis or large vessel occlusion. Venous sinuses: Normal venous enhancement. Anatomic variants: None Review of the MIP images confirms the above findings IMPRESSION: 1. Normal carotid and vertebral arteries in the neck 2. No intracranial large vessel occlusion. There is a small area of hypoperfusion in the left parietal lobe consistent with subacute infarct. This area shows hypodensity on CT. Electronically Signed   By: Franchot Gallo M.D.   On: 07/19/2020 20:58   DG Chest Portable 1 View  Result Date: 07/19/2020 CLINICAL DATA:  Fever, altered level of consciousness EXAM: PORTABLE CHEST 1 VIEW COMPARISON:  07/17/2020 FINDINGS: Single frontal view of the chest demonstrates an enlarged cardiac silhouette. Increased central vascular congestion, with interval development of bilateral interstitial prominence. No large effusion or pneumothorax. No acute bony abnormalities. IMPRESSION: 1. Worsening volume status, with mild interstitial edema on today's exam.  Electronically Signed   By: Randa Ngo M.D.   On: 07/19/2020 18:49    EKG: Independently reviewed.  Sinus or ectopic atrial tachycardia.  T wave abnormality in lateral leads similar to prior tracing.  QTc 587.  Assessment/Plan Principal Problem:   COVID-19 virus infection Active Problems:   CHF exacerbation ([facility])   Severe sepsis ([facility])   Sinus tachycardia   Acute CVA (cerebrovascular accident) ([place])   Severe sepsis secondary to COVID-19 viral infection: Meets criteria for severe sepsis -2 SIRS (fever, tachycardia), SARS-CoV-2 PCR test positive, and labs showing lactic acidosis (2.2> 2.6).  He had a chest CT done 2 days ago which was not suggestive of pneumonia.  Chest x-ray done today suggestive of pulmonary vascular congestion.  Bacterial infection less likely given no leukocytosis. -Patient received 1 L fluid bolus in the ED.  Will avoid giving additional fluid in the setting of significantly elevated BNP/decompensated CHF.  -Start remdesivir.  Hold steroids at this time given no hypoxia. -Vitamin C, zinc, vitamin D -Tylenol as needed for fevers -Inflammatory markers including ferritin, fibrinogen, D-dimer, CRP, LDH pending -Procalcitonin level pending -Daily CBC with differential, CMP, CRP, D-dimer -Airborne and contact precautions -Continuous pulse ox -Supplemental oxygen as needed to keep oxygen saturation above 90% -Blood culture x2 pending  Addendum: Lactate uptrending and procalcitonin elevated at 0.68. Broad-spectrum antibiotics started. UA and urine culture pending. Blood culture x2 pending. Although patient is not hypoxic, will start IV Decadron as inflammatory markers significantly elevated (CRP 12.4). Suspect he has covid pneumonia in addition to pulmonary edema.  Sinus tachycardia: EKG has been reviewed by cardiology and it is felt to be sinus tachycardia.  Likely due to acute viral infection/severe sepsis.  CT angiogram done 2 days ago was negative for PE.  Free T4  borderline elevated on labs but TSH is normal.  Patient has been taking pseudoephedrine at home for congestion which could be contributing.  In addition, he is noncompliant with his home medications and has not taken Coreg for the past month. 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  BNP significantly elevated at 1619.  Chest x-ray showing cardiomegaly and pulmonary vascular congestion. -Patient was given IV Lasix 40 mg x 1 in the ED. Continue diuresis with IV Lasix 40 mg twice daily starting in the morning.   -Monitor intake and output, daily weights, and low-sodium diet with fluid restriction.   -Repeat echocardiogram ordered.  Mild troponin elevation: Likely due to demand ischemia in the setting of decompensated CHF.  High sensitive troponin slightly elevated at 45.  Unable to obtain any history from the patient.  However, EKG without acute ischemic changes. -Cardiac monitoring -Trend troponin  Acute versus subacute CVA: Patient here for evaluation of expressive aphasia for several days and staggering gait. CT head showing acute/subacute infarct in the left posterior parietal lobe.  Mild mass-effect without midline shift.  No acute intracranial hemorrhage.  CTA head and neck negative for LVO. -Neurology consulted by ED provider, appreciate recommendations -Telemetry monitoring -MRI of the brain without contrast -2D echocardiogram -Hemoglobin A1c, fasting lipid panel -Holding aspirin as neurology is concerned about mild petechial hemorrhage in the area of the stroke.  After MRI and morning rounds, stroke team will make a decision on starting aspirin. -Atorvastatin 80 mg daily -Frequent neurochecks -PT, OT, speech therapy -N.p.o. until cleared by bedside swallow evaluation or formal speech  evaluation  Mild hyponatremia: Likely due to hypovolemia in the setting of decompensated CHF.  Sodium 130. -Continue diuresis with Lasix as mentioned above -Repeat BMP in a.m.  Hypertension -Resumed home Coreg given persistent tachycardia.  Hold any other antihypertensives at this time to allow permissive hypertension in the setting of stroke which is possibly acute.  Hyperlipidemia -Continue statin, dose increased to full intensity statin given stroke  Insulin-dependent type 2 diabetes: No recent A1c results available for review.  Per med rec, patient is noncompliant with home meds.  He has not taken any medications for the past month. -Check A1c.  Sliding-scale insulin sensitive ACHS.   QT prolongation on EKG -Cardiac monitoring -Keep potassium above 4 and magnesium above 2 -Repeat EKG in a.m. -Avoid QT prolonging drugs if possible.  DVT prophylaxis: Holding chemical DVT prophylaxis given concern for mild petechial hemorrhage in the area of the stroke.  SCDs ordered at this time. Code Status: Full code Family Communication: No family available this time. Disposition Plan: Status is: Inpatient  Remains inpatient appropriate because:Ongoing diagnostic testing needed not appropriate for outpatient work up, IV treatments appropriate due to intensity of illness or inability to take PO and Inpatient level of care appropriate due to severity of illness   Dispo: The patient is from: Home              Anticipated d/c is to: Home              Anticipated d/c date is: 3 days              Patient currently is not medically stable to d/c.  The medical decision making on this patient was of high complexity and the patient is at high risk for clinical deterioration, therefore this is a level 3 visit.  Shela Leff MD [facility]  If 7PM-7AM, please contact night-coverage [email or web address]  07/20/2020, 12:43 AM

## 2020-07-19 NOTE — ED Notes (Signed)
IV team consulted. Pt had a 20g PIV in left ac that was discontinued by CT and CT started new 20g PIV in right ac but that line infiltrated.

## 2020-07-19 NOTE — Progress Notes (Signed)
Following for Code Sepsis  

## 2020-07-20 ENCOUNTER — Inpatient Hospital Stay (HOSPITAL_COMMUNITY): Payer: BC Managed Care – PPO

## 2020-07-20 ENCOUNTER — Encounter (HOSPITAL_COMMUNITY): Payer: BC Managed Care – PPO

## 2020-07-20 ENCOUNTER — Encounter (HOSPITAL_COMMUNITY): Payer: Self-pay | Admitting: *Deleted

## 2020-07-20 DIAGNOSIS — I639 Cerebral infarction, unspecified: Secondary | ICD-10-CM | POA: Diagnosis not present

## 2020-07-20 DIAGNOSIS — U071 COVID-19: Secondary | ICD-10-CM

## 2020-07-20 DIAGNOSIS — I5043 Acute on chronic combined systolic (congestive) and diastolic (congestive) heart failure: Secondary | ICD-10-CM

## 2020-07-20 DIAGNOSIS — I5021 Acute systolic (congestive) heart failure: Secondary | ICD-10-CM | POA: Diagnosis not present

## 2020-07-20 DIAGNOSIS — R6520 Severe sepsis without septic shock: Secondary | ICD-10-CM

## 2020-07-20 DIAGNOSIS — I5022 Chronic systolic (congestive) heart failure: Secondary | ICD-10-CM | POA: Diagnosis not present

## 2020-07-20 DIAGNOSIS — R000 Tachycardia, unspecified: Secondary | ICD-10-CM

## 2020-07-20 DIAGNOSIS — A419 Sepsis, unspecified organism: Secondary | ICD-10-CM

## 2020-07-20 HISTORY — PX: TRANSTHORACIC ECHOCARDIOGRAM: SHX275

## 2020-07-20 LAB — FIBRINOGEN: Fibrinogen: 608 mg/dL — ABNORMAL HIGH (ref 210–475)

## 2020-07-20 LAB — RAPID URINE DRUG SCREEN, HOSP PERFORMED
Amphetamines: NOT DETECTED
Barbiturates: NOT DETECTED
Benzodiazepines: NOT DETECTED
Cocaine: NOT DETECTED
Opiates: NOT DETECTED
Tetrahydrocannabinol: NOT DETECTED

## 2020-07-20 LAB — CBG MONITORING, ED
Glucose-Capillary: 159 mg/dL — ABNORMAL HIGH (ref 70–99)
Glucose-Capillary: 148 mg/dL — ABNORMAL HIGH (ref 70–99)
Glucose-Capillary: 188 mg/dL — ABNORMAL HIGH (ref 70–99)
Glucose-Capillary: 170 mg/dL — ABNORMAL HIGH (ref 70–99)
Glucose-Capillary: 183 mg/dL — ABNORMAL HIGH (ref 70–99)

## 2020-07-20 LAB — HEMOGLOBIN A1C
Hgb A1c MFr Bld: 7.7 % — ABNORMAL HIGH (ref 4.8–5.6)
Mean Plasma Glucose: 174.29 mg/dL

## 2020-07-20 LAB — D-DIMER, QUANTITATIVE: D-Dimer, Quant: 1.27 ug/mL-FEU — ABNORMAL HIGH (ref 0.00–0.50)

## 2020-07-20 LAB — URINALYSIS, ROUTINE W REFLEX MICROSCOPIC
Bilirubin Urine: NEGATIVE
Glucose, UA: NEGATIVE mg/dL
Hgb urine dipstick: NEGATIVE
Ketones, ur: NEGATIVE mg/dL
Leukocytes,Ua: NEGATIVE
Nitrite: NEGATIVE
Protein, ur: NEGATIVE mg/dL
Specific Gravity, Urine: 1.021 (ref 1.005–1.030)
pH: 5 (ref 5.0–8.0)

## 2020-07-20 LAB — ECHOCARDIOGRAM COMPLETE
Calc EF: 37.9 %
S' Lateral: 5.4 cm
Single Plane A2C EF: 34.5 %
Single Plane A4C EF: 39.5 %

## 2020-07-20 LAB — LIPID PANEL
Cholesterol: 145 mg/dL (ref 0–200)
HDL: 31 mg/dL — ABNORMAL LOW (ref >40)
LDL Cholesterol: 102 mg/dL — ABNORMAL HIGH (ref 0–99)
Total CHOL/HDL Ratio: 4.7 RATIO
Triglycerides: 62 mg/dL (ref <150)
VLDL: 12 mg/dL (ref 0–40)

## 2020-07-20 LAB — FERRITIN: Ferritin: 933 ng/mL — ABNORMAL HIGH (ref 24–336)

## 2020-07-20 LAB — RPR: RPR Ser Ql: NONREACTIVE

## 2020-07-20 LAB — TROPONIN I (HIGH SENSITIVITY): Troponin I (High Sensitivity): 49 ng/L — ABNORMAL HIGH (ref <18)

## 2020-07-20 LAB — PROCALCITONIN: Procalcitonin: 0.68 ng/mL

## 2020-07-20 LAB — LACTATE DEHYDROGENASE: LDH: 248 U/L — ABNORMAL HIGH (ref 98–192)

## 2020-07-20 LAB — HEPARIN LEVEL (UNFRACTIONATED): Heparin Unfractionated: 0.61 IU/mL (ref 0.30–0.70)

## 2020-07-20 LAB — C-REACTIVE PROTEIN: CRP: 12.4 mg/dL — ABNORMAL HIGH (ref <1.0)

## 2020-07-20 LAB — LACTIC ACID, PLASMA: Lactic Acid, Venous: 3.4 mmol/L (ref 0.5–1.9)

## 2020-07-20 MED ORDER — ZINC SULFATE 220 (50 ZN) MG PO CAPS
220.0000 mg | ORAL_CAPSULE | Freq: Every day | ORAL | Status: DC
  Administered 2020-07-20 – 2020-07-28 (×9): 220 mg via ORAL
  Filled 2020-07-20 (×10): qty 1

## 2020-07-20 MED ORDER — ASPIRIN EC 325 MG PO TBEC
325.0000 mg | DELAYED_RELEASE_TABLET | Freq: Once | ORAL | Status: AC
  Administered 2020-07-20: 325 mg via ORAL
  Filled 2020-07-20: qty 1

## 2020-07-20 MED ORDER — ASPIRIN 300 MG RE SUPP: 300.0000 mg | Freq: Every day | RECTAL | Status: DC

## 2020-07-20 MED ORDER — VANCOMYCIN HCL 2000 MG/400ML IV SOLN
2000.0000 mg | Freq: Once | INTRAVENOUS | Status: AC
  Administered 2020-07-20: 2000 mg via INTRAVENOUS
  Filled 2020-07-20: qty 400

## 2020-07-20 MED ORDER — MAGNESIUM SULFATE IN D5W 1-5 GM/100ML-% IV SOLN
1.0000 g | Freq: Once | INTRAVENOUS | Status: AC
  Administered 2020-07-20: 1 g via INTRAVENOUS
  Filled 2020-07-20: qty 100

## 2020-07-20 MED ORDER — INSULIN ASPART 100 UNIT/ML ~~LOC~~ SOLN
0.0000 [IU] | Freq: Three times a day (TID) | SUBCUTANEOUS | Status: DC
  Administered 2020-07-20 (×2): 2 [IU] via SUBCUTANEOUS
  Administered 2020-07-20: 1 [IU] via SUBCUTANEOUS
  Administered 2020-07-21 (×2): 3 [IU] via SUBCUTANEOUS
  Administered 2020-07-21 – 2020-07-22 (×2): 2 [IU] via SUBCUTANEOUS
  Administered 2020-07-22: 5 [IU] via SUBCUTANEOUS
  Administered 2020-07-22 – 2020-07-23 (×2): 3 [IU] via SUBCUTANEOUS
  Administered 2020-07-23: 5 [IU] via SUBCUTANEOUS
  Administered 2020-07-23: 7 [IU] via SUBCUTANEOUS
  Administered 2020-07-24 (×2): 2 [IU] via SUBCUTANEOUS
  Administered 2020-07-24 – 2020-07-25 (×3): 3 [IU] via SUBCUTANEOUS
  Administered 2020-07-25: 2 [IU] via SUBCUTANEOUS
  Administered 2020-07-26 (×2): 3 [IU] via SUBCUTANEOUS
  Administered 2020-07-26: 5 [IU] via SUBCUTANEOUS
  Administered 2020-07-27: 3 [IU] via SUBCUTANEOUS
  Administered 2020-07-27: 5 [IU] via SUBCUTANEOUS
  Administered 2020-07-27: 1 [IU] via SUBCUTANEOUS
  Administered 2020-07-28: 3 [IU] via SUBCUTANEOUS

## 2020-07-20 MED ORDER — ASCORBIC ACID 500 MG PO TABS
500.0000 mg | ORAL_TABLET | Freq: Every day | ORAL | Status: DC
  Administered 2020-07-20 – 2020-07-28 (×9): 500 mg via ORAL
  Filled 2020-07-20 (×9): qty 1

## 2020-07-20 MED ORDER — FUROSEMIDE 10 MG/ML IJ SOLN
40.0000 mg | Freq: Two times a day (BID) | INTRAMUSCULAR | Status: DC
  Administered 2020-07-20: 40 mg via INTRAVENOUS
  Filled 2020-07-20: qty 4

## 2020-07-20 MED ORDER — ATORVASTATIN CALCIUM 80 MG PO TABS
80.0000 mg | ORAL_TABLET | Freq: Every day | ORAL | Status: DC
  Administered 2020-07-20 – 2020-07-28 (×9): 80 mg via ORAL
  Filled 2020-07-20 (×9): qty 1

## 2020-07-20 MED ORDER — PERFLUTREN LIPID MICROSPHERE
1.0000 mL | INTRAVENOUS | Status: AC | PRN
  Administered 2020-07-20: 2.5 mL via INTRAVENOUS
  Filled 2020-07-20: qty 10

## 2020-07-20 MED ORDER — ASPIRIN 325 MG PO TABS: 325.0000 mg | ORAL_TABLET | Freq: Every day | ORAL | Status: DC

## 2020-07-20 MED ORDER — SODIUM CHLORIDE 0.9 % IV SOLN
200.0000 mg | Freq: Once | INTRAVENOUS | Status: AC
  Administered 2020-07-20: 200 mg via INTRAVENOUS
  Filled 2020-07-20: qty 40

## 2020-07-20 MED ORDER — STROKE: EARLY STAGES OF RECOVERY BOOK
Freq: Once | Status: AC
  Filled 2020-07-20: qty 1

## 2020-07-20 MED ORDER — ENOXAPARIN SODIUM 40 MG/0.4ML ~~LOC~~ SOLN
40.0000 mg | SUBCUTANEOUS | Status: DC
  Administered 2020-07-20: 40 mg via SUBCUTANEOUS
  Filled 2020-07-20: qty 0.4

## 2020-07-20 MED ORDER — CARVEDILOL 12.5 MG PO TABS
12.5000 mg | ORAL_TABLET | Freq: Two times a day (BID) | ORAL | Status: DC
  Administered 2020-07-20 – 2020-07-28 (×17): 12.5 mg via ORAL
  Filled 2020-07-20 (×17): qty 1

## 2020-07-20 MED ORDER — INSULIN ASPART 100 UNIT/ML ~~LOC~~ SOLN
0.0000 [IU] | Freq: Every day | SUBCUTANEOUS | Status: DC
  Administered 2020-07-21 – 2020-07-23 (×3): 2 [IU] via SUBCUTANEOUS
  Administered 2020-07-24: 3 [IU] via SUBCUTANEOUS
  Administered 2020-07-27: 2 [IU] via SUBCUTANEOUS

## 2020-07-20 MED ORDER — DEXAMETHASONE SODIUM PHOSPHATE 10 MG/ML IJ SOLN
6.0000 mg | Freq: Every day | INTRAMUSCULAR | Status: DC
  Administered 2020-07-20 – 2020-07-25 (×6): 6 mg via INTRAVENOUS
  Filled 2020-07-20 (×7): qty 1

## 2020-07-20 MED ORDER — DOXYCYCLINE HYCLATE 100 MG PO TABS
100.0000 mg | ORAL_TABLET | Freq: Two times a day (BID) | ORAL | Status: AC
  Administered 2020-07-20 – 2020-07-24 (×10): 100 mg via ORAL
  Filled 2020-07-20 (×10): qty 1

## 2020-07-20 MED ORDER — POTASSIUM CHLORIDE CRYS ER 20 MEQ PO TBCR
20.0000 meq | EXTENDED_RELEASE_TABLET | Freq: Once | ORAL | Status: AC
  Administered 2020-07-20: 20 meq via ORAL

## 2020-07-20 MED ORDER — ENOXAPARIN SODIUM 40 MG/0.4ML ~~LOC~~ SOLN: 40.0000 mg | SUBCUTANEOUS | Status: DC

## 2020-07-20 MED ORDER — HEPARIN (PORCINE) 25000 UT/250ML-% IV SOLN
850.0000 [IU]/h | INTRAVENOUS | Status: DC
  Administered 2020-07-20: 1500 [IU]/h via INTRAVENOUS
  Administered 2020-07-21: 1350 [IU]/h via INTRAVENOUS
  Administered 2020-07-21: 1150 [IU]/h via INTRAVENOUS
  Administered 2020-07-22: 1000 [IU]/h via INTRAVENOUS
  Administered 2020-07-23: 1100 [IU]/h via INTRAVENOUS
  Administered 2020-07-24: 1500 [IU]/h via INTRAVENOUS
  Administered 2020-07-25: 1300 [IU]/h via INTRAVENOUS
  Administered 2020-07-26: 1150 [IU]/h via INTRAVENOUS
  Filled 2020-07-20 (×12): qty 250

## 2020-07-20 MED ORDER — ACETAMINOPHEN 325 MG PO TABS
650.0000 mg | ORAL_TABLET | Freq: Four times a day (QID) | ORAL | Status: DC | PRN
  Administered 2020-07-20 – 2020-07-23 (×3): 650 mg via ORAL
  Filled 2020-07-20 (×3): qty 2

## 2020-07-20 MED ORDER — SODIUM CHLORIDE 0.9 % IV SOLN
100.0000 mg | Freq: Every day | INTRAVENOUS | Status: AC
  Administered 2020-07-21 – 2020-07-24 (×4): 100 mg via INTRAVENOUS
  Filled 2020-07-20 (×5): qty 20

## 2020-07-20 MED ORDER — PIPERACILLIN-TAZOBACTAM 3.375 G IVPB
3.3750 g | Freq: Three times a day (TID) | INTRAVENOUS | Status: DC
  Administered 2020-07-20 (×2): 3.375 g via INTRAVENOUS
  Filled 2020-07-20 (×2): qty 50

## 2020-07-20 MED ORDER — CEFTRIAXONE SODIUM 1 G IJ SOLR
1.0000 g | INTRAMUSCULAR | Status: AC
  Administered 2020-07-20 – 2020-07-24 (×5): 1 g via INTRAVENOUS
  Filled 2020-07-20 (×5): qty 10

## 2020-07-20 MED ORDER — VANCOMYCIN HCL 1500 MG/300ML IV SOLN
1500.0000 mg | Freq: Two times a day (BID) | INTRAVENOUS | Status: DC
  Filled 2020-07-20: qty 300

## 2020-07-20 MED ORDER — VITAMIN D 25 MCG (1000 UNIT) PO TABS
1000.0000 [IU] | ORAL_TABLET | Freq: Every day | ORAL | Status: DC
  Administered 2020-07-20 – 2020-07-28 (×9): 1000 [IU] via ORAL
  Filled 2020-07-20 (×9): qty 1

## 2020-07-20 NOTE — ED Notes (Signed)
Nettie Elm (pt sister) called for an update please call [phone].

## 2020-07-20 NOTE — ED Notes (Addendum)
MRI called this RN, advised that pt unable to answer orientation questions. Pt to return to ED for further testing before MRI performed. MD Rathore paged & notified of same

## 2020-07-20 NOTE — Progress Notes (Signed)
ANTICOAGULATION CONSULT NOTE  Pharmacy Consult for Heparin Indication: LV thrombus with recent CVA  No Known Allergies  Patient Measurements: Height: 6\' 1"  (185.4 cm) Weight: 119 kg (262 lb 5.6 oz) IBW/kg (Calculated) : 79.9 Heparin Dosing Weight: 105.6 kg  Vital Signs: BP: 101/76 (12/31 2100) Pulse Rate: 81 (12/31 2100)  Labs: Recent Labs    07/19/20 1853 07/19/20 1907 07/19/20 1921 07/19/20 2107 07/20/20 2223  HGB 14.1  --  15.3  --   --   HCT 44.2  --  45.0  --   --   PLT 242  --   --   --   --   APTT 33  --   --   --   --   LABPROT 14.5  --   --   --   --   INR 1.2  --   --   --   --   HEPARINUNFRC  --   --   --   --  0.61  CREATININE 1.19  --   --   --   --   TROPONINIHS  --  45*  --  49*  --     Estimated Creatinine Clearance: 105.9 mL/min (by C-G formula based on SCr of 1.19 mg/dL).   Medical History: Past Medical History:  Diagnosis Date  . Diabetes mellitus without complication ([facility])   . Hemoglobin A1C greater than 9%, indicating poor diabetic control 12/2019  . Hyperglycemia 12/2019  . Hyperlipidemia 12/2019  . Hypertension   . Hypertensive urgency 12/2019  . Noncompliance with medication regimen 12/2019  . Urine ketones 12/2019  . Vitamin D deficiency 12/2019    Medications:  Scheduled:  . vitamin C  500 mg Oral Daily  . atorvastatin  80 mg Oral Daily  . carvedilol  12.5 mg Oral BID WC  . cholecalciferol  1,000 Units Oral Daily  . dexamethasone (DECADRON) injection  6 mg Intravenous Daily  . doxycycline  100 mg Oral Q12H  . insulin aspart  0-5 Units Subcutaneous QHS  . insulin aspart  0-9 Units Subcutaneous TID WC  . insulin lispro  20 Units Subcutaneous Once  . zinc sulfate  220 mg Oral Daily    Assessment: Patient is a 44 yom that is admitted for a CVA. Patient was also found to have a LV thrombus on evaluation and is on no prior anticoagulation. Pharmacy has been asked to dose heparin at this time for the LV thrombus with a neuro  protocol 2/2 CVA.    12/31 PM update:  Heparin level above goal  Goal of Therapy:  Heparin level 0.3-0.5 units/ml Monitor platelets by anticoagulation protocol: Yes   Plan:  -Dec heparin to 1350 units/hr -Re-check heparin level with AM labs -Monitor patient for s/s of bleeding and CBC while on heparin   1/32, PharmD, BCPS Clinical Pharmacist Phone: [phone]

## 2020-07-20 NOTE — Progress Notes (Addendum)
PROGRESS NOTE                                                                             PROGRESS NOTE                                                                                                                                                                                                             Patient Demographics:    Rodney Kane, is a 44 y.o. male, DOB - [birth year], YNW:[id number]  Outpatient Primary MD for the patient is Kallie Locks, FNP    LOS - 1  Admit date - 07/19/2020    Chief Complaint  Patient presents with  . Altered Mental Status       Brief Narrative     HPI: Rodney Kane is a 44 y.o. 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  Family reported to EMS that patient has had difficulty speaking for the last several days.  Unable to obtain any history from patient at this time due to his expressive aphasia.  No family bedside. Of note, patient was seen in the ED several days ago for chest pain and shortness of breath.  He had an unremarkable work-up at that time including CT of his chest showing no PE.  ED Course: Febrile with temperature 102.2 F.  Tachycardic with heart rate in the 140s.  Not hypotensive.  Not tachypneic or hypoxic.  SARS-CoV-2 PCR test positive.  Influenza panel negative.  WBC 6.5, hemoglobin 14.1, hematocrit 44.2, platelet count 242K.  Sodium 130, potassium 3.8, chloride 94, bicarb 21, BUN 15, creatinine 1.1, glucose 182.  LFTs normal.  Lipase normal.  Lactic acid 2.2 >2.6.  INR 1.2.  TSH normal.  Free T4 borderline elevated at 1.26.  Magnesium within normal range.  RPR pending.  Ammonia level <9.  Blood ethanol level undetectable.  UA  and urine culture pending.  UDS pending.  Blood culture x2 pending.  Initial high-sensitivity troponin 45, repeat pending.  BNP significantly elevated at 1619.  VBG with pH 7.48.  Triglycerides within normal range.  Ferritin, fibrinogen, LDH, CRP, D-dimer, procalcitonin pending.  CT head showing acute/subacute infarct in the left posterior parietal lobe.  Mild mass-effect without midline shift.  No acute intracranial hemorrhage.  ED provider discussed the case with Dr. Wilford Corner from neurology who recommended admission for stroke work-up with MRI and CTA of head and neck. CTA head and neck negative for LVO.  Brain MRI pending.  Chest x-ray showing cardiomegaly and pulmonary vascular congestion.  Given persistent tachycardia there is question for a flutter versus A. fib.  However, ED provider spoke to Dr. Mayford Knife who reviewed the patient's EKG and felt that it was showing sinus tachycardia.  Patient received Tylenol, ceftriaxone, and 1 L LR bolus initially for sepsis.  He was later given a dose of IV Lasix 40 mg for volume overload.    Subjective:    Rodney Kane today with expressive aphasia, no significant events as discussed with staff, he is himself unable to provide any complaints.     Assessment  & Plan :    Principal Problem:   COVID-19 virus infection Active Problems:   CHF exacerbation (HCC)   Severe sepsis (HCC)   Sinus tachycardia   Acute CVA (cerebrovascular accident) (HCC)     SpO2: 94 %  Recent Labs  Lab 07/16/20 1634 07/16/20 2229 07/19/20 1831 07/19/20 1853 07/19/20 1907 07/19/20 2053 07/19/20 2107 07/20/20 0416  WBC 4.8  --   --  6.5  --   --   --   --   PLT 281  --   --  242  --   --   --   --   CRP  --   --   --   --   --   --  12.4*  --   BNP  --   --   --   --  4,098.1*  --   --   --   DDIMER  --  0.58*  --   --   --   --  1.27*  --   PROCALCITON  --   --   --   --   --   --  0.68  --   AST  --   --   --  22  --   --   --   --   ALT  --   --   --  21   --   --   --   --   ALKPHOS  --   --   --  48  --   --   --   --   BILITOT  --   --   --  1.0  --   --   --   --   ALBUMIN  --   --   --  3.6  --   --   --   --   INR  --   --   --  1.2  --   --   --   --   LATICACIDVEN  --   --   --  2.2*  --  2.6*  --  3.4*  SARSCOV2NAA  --  NEGATIVE POSITIVE*  --   --   --   --   --  ABG     Component Value Date/Time   PHART 7.458 (H) 01/05/2017 0817   PCO2ART 39.9 01/05/2017 0817   PO2ART 70.0 (L) 01/05/2017 0817   HCO3 25.6 07/19/2020 1921   TCO2 27 07/19/2020 1921   O2SAT 54.0 07/19/2020 1921     Acute CVA:  - Patient with  expressive aphasia for several days and staggering gait. -MRI brain cannot for large acute infarct in the posterior left hemisphere with petechial hemorrhage and cytotoxic edema, but no malignant hemorrhagic transformation or significant intracranial mass effect, solitary small acute cortical infarcts superimposed in the superior right parietal lobe, and chronic microhemorrhages . -  CT head showing acute/subacute infarct in the left posterior parietal lobe.  Mild mass-effect without midline shift.  No acute intracranial hemorrhage.  CTA head and neck negative for LVO. -Significant for low EF 20 to 25%, and apical thrombus, most likely the cause of his acute CVA, discussed with neurology, recommendation for stroke protocol heparin drip. -Hemoglobin A1c, fasting lipid panel -Atorvastatin 80 mg daily -PT, OT, speech therapy    COVID-19 viral infection: -Vaccination status is unclear, sister does not know, unable to reach his son. -No pneumonia on imaging, no hypoxia. -He would be started empirically on remdesivir and steroids at this point. -Continue to trend inflammatory markers -Procalcitonin of 0.68 will narrow from vancomycin and Zosyn to Rocephin and doxycycline.   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 -Repeat 2D echo this admission with low EF 20 to 25%, and apical thrombus, low EF and thrombus most likely in the setting of known chronic CHF, unlikely Covid at this point contributing given patient is still early in his Covid disease, with no evidence of pneumonia or significant coagulopathy yet, so low EF most likely progression of his known nonischemic cardiomyopathy with noncompliance with medications, and apical thrombus are related to that and not due to Covid. -Mangement per cardiology, they were consulted, but overall will need to allow for permissive hypertension before any medication to be resumed or adjusted.  Mild troponin elevation:  -Most likely in the setting of acute CVA, and atrial thrombus .  Mild hyponatremia:  -L solved with hydration  Hypertension -Resumed home Coreg given persistent tachycardia.  Hold any other antihypertensives at this time to allow permissive hypertension in the setting of stroke which is possibly acute.  Hyperlipidemia -Continue statin, dose increased to full intensity statin given stroke  Insulin-dependent type 2 diabetes:  - No recent A1c results available for review.  Per med rec, patient is noncompliant with home meds.  He has not taken any medications for the past month. -Check A1c.  Sliding-scale insulin sensitive ACHS.   QT prolongation on EKG -Cardiac monitoring -Keep potassium above 4 and magnesium above 2 -Repeat EKG in a.m. -Avoid QT prolonging drugs if possible.   Condition - Extremely Guarded  Family Communication  : Cussed with sister by phone Nettie Elm on [phone], tried to reach son Carles Collet  on [phone] with no availability  Code Status :  Full  Consults  :  Neurology, cardiology  Procedures  :  none  Disposition Plan  :    Status is: Inpatient  Remains inpatient appropriate because:IV treatments appropriate due to intensity of illness or inability to take PO   Dispo: The patient is from: Home               Anticipated d/c is to: SNF  Anticipated d/c date is: > 3 days              Patient currently is not medically stable to d/c.      DVT Prophylaxis  :  Heparin GTT  Lab Results  Component Value Date   PLT 242 07/19/2020    Diet :  Diet Order            Diet Carb Modified Fluid consistency: Thin; Room service appropriate? Yes  Diet effective now                  Inpatient Medications  Scheduled Meds: . vitamin C  500 mg Oral Daily  . atorvastatin  80 mg Oral Daily  . carvedilol  12.5 mg Oral BID WC  . cholecalciferol  1,000 Units Oral Daily  . dexamethasone (DECADRON) injection  6 mg Intravenous Daily  . furosemide  40 mg Intravenous BID  . insulin aspart  0-5 Units Subcutaneous QHS  . insulin aspart  0-9 Units Subcutaneous TID WC  . insulin lispro  20 Units Subcutaneous Once  . zinc sulfate  220 mg Oral Daily   Continuous Infusions: . piperacillin-tazobactam (ZOSYN)  IV 3.375 g (07/20/20 1336)  . [START ON 07/21/2020] remdesivir 100 mg in NS 100 mL    . vancomycin     PRN Meds:.acetaminophen  Antibiotics  :    Anti-infectives (From admission, onward)   Start     Dose/Rate Route Frequency Ordered Stop   07/21/20 1000  remdesivir 100 mg in sodium chloride 0.9 % 100 mL IVPB       "Followed by" Linked Group Details   100 mg 200 mL/hr over 30 Minutes Intravenous Daily 07/20/20 0055 07/25/20 0959   07/20/20 1800  vancomycin (VANCOREADY) IVPB 1500 mg/300 mL        1,500 mg 150 mL/hr over 120 Minutes Intravenous Every 12 hours 07/20/20 0628     07/20/20 0700  vancomycin (VANCOREADY) IVPB 2000 mg/400 mL        2,000 mg 200 mL/hr over 120 Minutes Intravenous  Once 07/20/20 0628 07/20/20 1355   07/20/20 0630  piperacillin-tazobactam (ZOSYN) IVPB 3.375 g        3.375 g 12.5 mL/hr over 240 Minutes Intravenous Every 8 hours 07/20/20 0628     07/20/20 0200  remdesivir 200 mg in sodium chloride 0.9% 250 mL IVPB       "Followed by" Linked Group Details    200 mg 580 mL/hr over 30 Minutes Intravenous Once 07/20/20 0055 07/20/20 0238   07/19/20 1900  cefTRIAXone (ROCEPHIN) 2 g in sodium chloride 0.9 % 100 mL IVPB        2 g 200 mL/hr over 30 Minutes Intravenous  Once 07/19/20 1856 07/19/20 2040        Kalani Sthilaire M.D on 07/20/2020 at 2:02 PM  To page go to [email or web address]   [facility] -  Office  [phone]      Objective:   Vitals:   07/20/20 0825 07/20/20 0847 07/20/20 1200 07/20/20 1231  BP: (!) 139/97 (!) 139/97 115/85   Pulse: (!) 119 (!) 124 95 93  Resp: (!) 23 (!) 25 17 16   Temp:  98.4 F (36.9 C)    TempSrc:  Oral    SpO2: 95% 93% 95% 94%    Wt Readings from Last 3 Encounters:  07/16/20 119 kg  02/23/20 119.7 kg  01/06/20 121.6 kg     Intake/Output Summary (Last 24 hours) at 07/20/2020 1402  Last data filed at 07/20/2020 0238 Gross per 24 hour  Intake 1275.11 ml  Output 1150 ml  Net 125.11 ml     Physical Exam  Awake Alert, with significant expressive aphasia Symmetrical Chest wall movement, Good air movement bilaterally, CTAB RRR,No Gallops,Rubs or new Murmurs, No Parasternal Heave +ve B.Sounds, Abd Soft, No tenderness. No Cyanosis, Clubbing or edema, No new Rash or bruise      Data Review:    CBC Recent Labs  Lab 07/16/20 1634 07/19/20 1853 07/19/20 1921  WBC 4.8 6.5  --   HGB 13.2 14.1 15.3  HCT 41.2 44.2 45.0  PLT 281 242  --   MCV 81.7 79.2*  --   MCH 26.2 25.3*  --   MCHC 32.0 31.9  --   RDW 13.4 12.8  --   LYMPHSABS  --  0.8  --   MONOABS  --  0.7  --   EOSABS  --  0.0  --   BASOSABS  --  0.0  --     Recent Labs  Lab 07/16/20 1634 07/16/20 2229 07/19/20 1853 07/19/20 1855 07/19/20 1856 07/19/20 1907 07/19/20 1921 07/19/20 2053 07/19/20 2107 07/20/20 0416  NA 141  --  130*  --   --   --  133*  --   --   --   K 4.0  --  3.8  --   --   --  3.9  --   --   --   CL 105  --  94*  --   --   --   --   --   --   --   CO2 27  --  21*  --   --   --   --   --    --   --   GLUCOSE 141*  --  182*  --   --   --   --   --   --   --   BUN 14  --  15  --   --   --   --   --   --   --   CREATININE 1.02  --  1.19  --   --   --   --   --   --   --   CALCIUM 9.1  --  9.0  --   --   --   --   --   --   --   AST  --   --  22  --   --   --   --   --   --   --   ALT  --   --  21  --   --   --   --   --   --   --   ALKPHOS  --   --  48  --   --   --   --   --   --   --   BILITOT  --   --  1.0  --   --   --   --   --   --   --   ALBUMIN  --   --  3.6  --   --   --   --   --   --   --   MG  --   --  1.8  --   --   --   --   --   --   --  CRP  --   --   --   --   --   --   --   --  12.4*  --   DDIMER  --  0.58*  --   --   --   --   --   --  1.27*  --   PROCALCITON  --   --   --   --   --   --   --   --  0.68  --   LATICACIDVEN  --   --  2.2*  --   --   --   --  2.6*  --  3.4*  INR  --   --  1.2  --   --   --   --   --   --   --   TSH  --   --   --  1.447  --   --   --   --   --   --   HGBA1C  --   --   --   --   --   --   --   --   --  7.7*  AMMONIA  --   --   --   --  <9*  --   --   --   --   --   BNP  --   --   --   --   --  1,619.5*  --   --   --   --     ------------------------------------------------------------------------------------------------------------------ Recent Labs    07/19/20 2103 07/20/20 0416  CHOL  --  145  HDL  --  31*  LDLCALC  --  102*  TRIG 54 62  CHOLHDL  --  4.7    Lab Results  Component Value Date   HGBA1C 7.7 (H) 07/20/2020   ------------------------------------------------------------------------------------------------------------------ Recent Labs    07/19/20 1855  TSH 1.447    Cardiac Enzymes No results for input(s): CKMB, TROPONINI, MYOGLOBIN in the last 168 hours.  Invalid input(s): CK ------------------------------------------------------------------------------------------------------------------    Component Value Date/Time   BNP 1,619.5 (H) 07/19/2020 1907    Micro Results Recent Results (from  the past 240 hour(s))  Resp Panel by RT-PCR (Flu A&B, Covid) Nasopharyngeal Swab     Status: None   Collection Time: 07/16/20 10:29 PM   Specimen: Nasopharyngeal Swab; Nasopharyngeal(NP) swabs in vial transport medium  Result Value Ref Range Status   SARS Coronavirus 2 by RT PCR NEGATIVE NEGATIVE Final    Comment: (NOTE) SARS-CoV-2 target nucleic acids are NOT DETECTED.  The SARS-CoV-2 RNA is generally detectable in upper respiratory specimens during the acute phase of infection. The lowest concentration of SARS-CoV-2 viral copies this assay can detect is 138 copies/mL. A negative result does not preclude SARS-Cov-2 infection and should not be used as the sole basis for treatment or other patient management decisions. A negative result may occur with  improper specimen collection/handling, submission of specimen other than nasopharyngeal swab, presence of viral mutation(s) within the areas targeted by this assay, and inadequate number of viral copies(<138 copies/mL). A negative result must be combined with clinical observations, patient history, and epidemiological information. The expected result is Negative.  Fact Sheet for Patients:  BloggerCourse.com  Fact Sheet for Healthcare Providers:  SeriousBroker.it  This test is no t yet approved or cleared by the [place] FDA and  has been authorized for detection and/or diagnosis of SARS-CoV-2 by FDA under an Emergency Use Authorization (  EUA). This EUA will remain  in effect (meaning this test can be used) for the duration of the COVID-19 declaration under Section 564(b)(1) of the Act, 21 U.S.C.section 360bbb-3(b)(1), unless the authorization is terminated  or revoked sooner.       Influenza A by PCR NEGATIVE NEGATIVE Final   Influenza B by PCR NEGATIVE NEGATIVE Final    Comment: (NOTE) The Xpert Xpress SARS-CoV-2/FLU/RSV plus assay is intended as an aid in the diagnosis of  influenza from Nasopharyngeal swab specimens and should not be used as a sole basis for treatment. Nasal washings and aspirates are unacceptable for Xpert Xpress SARS-CoV-2/FLU/RSV testing.  Fact Sheet for Patients: BloggerCourse.com  Fact Sheet for Healthcare Providers: SeriousBroker.it  This test is not yet approved or cleared by the [place] FDA and has been authorized for detection and/or diagnosis of SARS-CoV-2 by FDA under an Emergency Use Authorization (EUA). This EUA will remain in effect (meaning this test can be used) for the duration of the COVID-19 declaration under Section 564(b)(1) of the Act, 21 U.S.C. section 360bbb-3(b)(1), unless the authorization is terminated or revoked.  Performed at [facility], [address] [place], [place] [address]   Resp Panel by RT-PCR (Flu A&B, Covid) Nasopharyngeal Swab     Status: Abnormal   Collection Time: 07/19/20  6:31 PM   Specimen: Nasopharyngeal Swab; Nasopharyngeal(NP) swabs in vial transport medium  Result Value Ref Range Status   SARS Coronavirus 2 by RT PCR POSITIVE (A) NEGATIVE Final    Comment: RESULT CALLED TO, READ BACK BY AND VERIFIED WITH: Rema Jasmine, RN 07/19/20 at 1947 sk  (NOTE) SARS-CoV-2 target nucleic acids are DETECTED.  The SARS-CoV-2 RNA is generally detectable in upper respiratory specimens during the acute phase of infection. Positive results are indicative of the presence of the identified virus, but do not rule out bacterial infection or co-infection with other pathogens not detected by the test. Clinical correlation with patient history and other diagnostic information is necessary to determine patient infection status. The expected result is Negative.  Fact Sheet for Patients: BloggerCourse.com  Fact Sheet for Healthcare Providers: SeriousBroker.it  This test is not yet approved or  cleared by the [place] FDA and  has been authorized for detection and/or diagnosis of SARS-CoV-2 by FDA under an Emergency Use Authorization (EUA).  This EUA will remain in effect (meaning this test can  be used) for the duration of  the COVID-19 declaration under Section 564(b)(1) of the Act, 21 U.S.C. section 360bbb-3(b)(1), unless the authorization is terminated or revoked sooner.     Influenza A by PCR NEGATIVE NEGATIVE Final   Influenza B by PCR NEGATIVE NEGATIVE Final    Comment: (NOTE) The Xpert Xpress SARS-CoV-2/FLU/RSV plus assay is intended as an aid in the diagnosis of influenza from Nasopharyngeal swab specimens and should not be used as a sole basis for treatment. Nasal washings and aspirates are unacceptable for Xpert Xpress SARS-CoV-2/FLU/RSV testing.  Fact Sheet for Patients: BloggerCourse.com  Fact Sheet for Healthcare Providers: SeriousBroker.it  This test is not yet approved or cleared by the [place] FDA and has been authorized for detection and/or diagnosis of SARS-CoV-2 by FDA under an Emergency Use Authorization (EUA). This EUA will remain in effect (meaning this test can be used) for the duration of the COVID-19 declaration under Section 564(b)(1) of the Act, 21 U.S.C. section 360bbb-3(b)(1), unless the authorization is terminated or revoked.  Performed at [facility], [address] [place], [place]  [address]   Blood Culture (routine x 2)     Status: None (Preliminary result)   Collection Time: 07/19/20  6:58 PM   Specimen: BLOOD  Result Value Ref Range Status   Specimen Description BLOOD LEFT ANTECUBITAL  Final   Special Requests   Final    BOTTLES DRAWN AEROBIC AND ANAEROBIC Blood Culture adequate volume   Culture   Final    NO GROWTH < 12 HOURS Performed at [facility], [address], [place], [place] [address]    Report Status PENDING  Incomplete    Radiology  Reports CT Angio Head W or Wo Contrast  Result Date: 07/19/2020 CLINICAL DATA:  Stroke-like symptoms. Expressive aphasia, headache, confusion EXAM: CT ANGIOGRAPHY HEAD AND NECK TECHNIQUE: Multidetector CT imaging of the head and neck was performed using the standard protocol during bolus administration of intravenous contrast. Multiplanar CT image reconstructions and MIPs were obtained to evaluate the vascular anatomy. Carotid stenosis measurements (when applicable) are obtained utilizing NASCET criteria, using the distal internal carotid diameter as the denominator. CONTRAST:  75mL OMNIPAQUE IOHEXOL 350 MG/ML SOLN COMPARISON:  CT head 07/19/2020 FINDINGS: CTA NECK FINDINGS Aortic arch: Normal aortic arch and proximal great vessels. Bovine branching pattern. Right carotid system: Normal right carotid. Negative for stenosis or dissection. Left carotid system: Normal left carotid. Negative for stenosis or dissection. Vertebral arteries: Both vertebral arteries are normal and patent to the basilar. Right vertebral artery dominant. Skeleton: No acute skeletal abnormality. Other neck: Negative for mass or adenopathy in the neck. Upper chest: Small bilateral effusions. Patchy nodular airspace disease left upper lobe, not present on CT chest 07/17/2020. Possible pneumonia or edema. Review of the MIP images confirms the above findings CTA HEAD FINDINGS Anterior circulation: Normal cavernous carotid. Right anterior and middle cerebral arteries widely patent. Left anterior cerebral artery patent. Left M1 segment widely patent. Left MCA bifurcation normal. No large vessel occlusion. There is an area of hypoperfusion in the left parietal lobe corresponding to the hypodensity. This is consistent with recent infarct with distal MCA occlusion Posterior circulation: Both vertebral arteries patent to the basilar. PICA patent bilaterally. Basilar widely patent. Posterior circulation normal without stenosis or large vessel  occlusion. Venous sinuses: Normal venous enhancement. Anatomic variants: None Review of the MIP images confirms the above findings IMPRESSION: 1. Normal carotid and vertebral arteries in the neck 2. No intracranial large vessel occlusion. There is a small area of hypoperfusion in the left parietal lobe consistent with subacute infarct. This area shows hypodensity on CT. Electronically Signed   By: Marlan Palau M.D.   On: 07/19/2020 20:58   DG Abd 1 View  Result Date: 07/20/2020 CLINICAL DATA:  Pre MRI EXAM: ABDOMEN - 1 VIEW COMPARISON:  CT 02/24/2020 FINDINGS: No implantable device seen. Patient's metallic belt buckle and zipper noted. Nonobstructive bowel gas pattern. No organomegaly or free air. IMPRESSION: No visible implantable device. Electronically Signed   By: Charlett Nose M.D.   On: 07/20/2020 07:35   CT Head Wo Contrast  Result Date: 07/19/2020 CLINICAL DATA:  Altered mental status. Stroke-like symptoms for 4 days. Expressive aphasia, headache, and confusion. EXAM: CT HEAD WITHOUT CONTRAST TECHNIQUE: Contiguous axial images were obtained from the base of the skull through the vertex without intravenous contrast. COMPARISON:  None. FINDINGS: Brain: Poorly defined area of low attenuation change extending to the cortical surface and involving the left posterior parietal lobe. This is consistent with acute or subacute infarct. Mild mass effect with effacement of sulci  and left lateral ventricle. No midline shift. No abnormal extra-axial fluid collections. No acute intracranial hemorrhage. No ventricular dilatation. Vascular: Mild intracranial arterial vascular calcifications. Skull: Calvarium appears intact. Sinuses/Orbits: Paranasal sinuses and mastoid air cells are clear. Other: None. IMPRESSION: Low-attenuation in the left posterior parietal lobe consistent with acute or subacute infarct. Mild mass effect without midline shift. No acute intracranial hemorrhage. Electronically Signed   By: Burman Nieves M.D.   On: 07/19/2020 19:36   CT Angio Neck W and/or Wo Contrast  Result Date: 07/19/2020 CLINICAL DATA:  Stroke-like symptoms. Expressive aphasia, headache, confusion EXAM: CT ANGIOGRAPHY HEAD AND NECK TECHNIQUE: Multidetector CT imaging of the head and neck was performed using the standard protocol during bolus administration of intravenous contrast. Multiplanar CT image reconstructions and MIPs were obtained to evaluate the vascular anatomy. Carotid stenosis measurements (when applicable) are obtained utilizing NASCET criteria, using the distal internal carotid diameter as the denominator. CONTRAST:  74mL OMNIPAQUE IOHEXOL 350 MG/ML SOLN COMPARISON:  CT head 07/19/2020 FINDINGS: CTA NECK FINDINGS Aortic arch: Normal aortic arch and proximal great vessels. Bovine branching pattern. Right carotid system: Normal right carotid. Negative for stenosis or dissection. Left carotid system: Normal left carotid. Negative for stenosis or dissection. Vertebral arteries: Both vertebral arteries are normal and patent to the basilar. Right vertebral artery dominant. Skeleton: No acute skeletal abnormality. Other neck: Negative for mass or adenopathy in the neck. Upper chest: Small bilateral effusions. Patchy nodular airspace disease left upper lobe, not present on CT chest 07/17/2020. Possible pneumonia or edema. Review of the MIP images confirms the above findings CTA HEAD FINDINGS Anterior circulation: Normal cavernous carotid. Right anterior and middle cerebral arteries widely patent. Left anterior cerebral artery patent. Left M1 segment widely patent. Left MCA bifurcation normal. No large vessel occlusion. There is an area of hypoperfusion in the left parietal lobe corresponding to the hypodensity. This is consistent with recent infarct with distal MCA occlusion Posterior circulation: Both vertebral arteries patent to the basilar. PICA patent bilaterally. Basilar widely patent. Posterior circulation normal  without stenosis or large vessel occlusion. Venous sinuses: Normal venous enhancement. Anatomic variants: None Review of the MIP images confirms the above findings IMPRESSION: 1. Normal carotid and vertebral arteries in the neck 2. No intracranial large vessel occlusion. There is a small area of hypoperfusion in the left parietal lobe consistent with subacute infarct. This area shows hypodensity on CT. Electronically Signed   By: Marlan Palau M.D.   On: 07/19/2020 20:58   CT Angio Chest PE W and/or Wo Contrast  Result Date: 07/17/2020 CLINICAL DATA:  Dyspnea, left chest pain, productive cough EXAM: CT ANGIOGRAPHY CHEST WITH CONTRAST TECHNIQUE: Multidetector CT imaging of the chest was performed using the standard protocol during bolus administration of intravenous contrast. Multiplanar CT image reconstructions and MIPs were obtained to evaluate the vascular anatomy. CONTRAST:  75mL OMNIPAQUE IOHEXOL 350 MG/ML SOLN COMPARISON:  None FINDINGS: Cardiovascular: There is adequate opacification of the pulmonary arterial tree. There is no intraluminal filling defect identified to suggest acute pulmonary embolism. Central pulmonary arteries are of normal caliber. No significant coronary artery calcification. Global cardiac size is within normal limits. Mild left ventricular dilation, however, is noted. No pericardial effusion. The thoracic aorta is unremarkable. Mediastinum/Nodes: No enlarged mediastinal, hilar, or axillary lymph nodes. Thyroid gland, trachea, and esophagus demonstrate no significant findings. Lungs/Pleura: Small right pleural effusion is present with minimal associated right basilar atelectasis. The lungs are otherwise clear. No pneumothorax. No central obstructing lesion. Upper Abdomen: No  acute abnormality. Musculoskeletal: Osseous structures are age-appropriate. Gynecomastia noted. Review of the MIP images confirms the above findings. IMPRESSION: No pulmonary embolism. Mild left ventricular  dilation. This could be better assessed with echocardiography. Small right pleural effusion. Electronically Signed   By: Helyn Numbers MD   On: 07/17/2020 01:55   MR Brain Wo Contrast (neuro protocol)  Result Date: 07/20/2020 CLINICAL DATA:  44 year old male with stroke-like symptoms for 4 days. 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 COVID-19. EXAM: MRI HEAD WITHOUT CONTRAST TECHNIQUE: Multiplanar, multiecho pulse sequences of the brain and surrounding structures were obtained without intravenous contrast. COMPARISON:  CT head, CTA head and neck yesterday. FINDINGS: Brain: Confluent restricted diffusion from the posterior temporal lobe through the lateral left occipital lobe tracking to the left occipital pole, an area of 9.5 cm (series 2, image 29) corresponding to the CT finding yesterday. Multifocal petechial hemorrhage (series 7, image 61) and confluent cytotoxic edema but no malignant hemorrhagic transformation and only mild mass effect on the left lateral ventricle without midline shift. Superimposed small cortical restricted diffusion at the right superior parietal lobe (series 2, image 44). Mild if any associated T2 and FLAIR hyperintensity. No hemorrhage. No other restricted diffusion. No midline shift, evidence of mass lesion, ventriculomegaly, extra-axial collection. Patchy and nodular superimposed bilateral cerebral white matter T2 and FLAIR hyperintensity in a nonspecific configuration. The corpus callosum and temporal lobes are spared. No chronic cortical encephalomalacia. But there do appear to be several chronic microhemorrhages in the left lentiform nuclei (series 7, images 52 and 56). No other chronic cerebral blood products. Elsewhere the deep gray nuclei, brainstem and cerebellum are within normal limits. Cervicomedullary junction and pituitary are within normal limits. Vascular: Major intracranial vascular flow voids are preserved.  Skull and upper cervical spine: Negative. Sinuses/Orbits: Disconjugate gaze otherwise negative orbits. Paranasal sinuses and mastoids are stable and well pneumatized. Other: None. IMPRESSION: 1. Large acute infarct in the posterior left hemisphere with petechial hemorrhage and cytotoxic edema, but no malignant hemorrhagic transformation or significant intracranial mass effect. 2. Solitary small acute cortical infarct superimposed in the superior right parietal lobe. 3. Chronic micro-hemorrhages in the left lentiform nuclei. Bilateral periventricular white matter signal changes which may therefore be chronic small vessel disease related. Electronically Signed   By: Odessa Fleming M.D.   On: 07/20/2020 12:00   DG Chest Portable 1 View  Result Date: 07/19/2020 CLINICAL DATA:  Fever, altered level of consciousness EXAM: PORTABLE CHEST 1 VIEW COMPARISON:  07/17/2020 FINDINGS: Single frontal view of the chest demonstrates an enlarged cardiac silhouette. Increased central vascular congestion, with interval development of bilateral interstitial prominence. No large effusion or pneumothorax. No acute bony abnormalities. IMPRESSION: 1. Worsening volume status, with mild interstitial edema on today's exam. Electronically Signed   By: Sharlet Salina M.D.   On: 07/19/2020 18:49   DG Chest Port 1 View  Result Date: 07/16/2020 CLINICAL DATA:  Intermittent sharp left-sided chest pain, short of breath, productive cough EXAM: PORTABLE CHEST 1 VIEW COMPARISON:  05/29/2018 FINDINGS: The heart size and mediastinal contours are within normal limits. Both lungs are clear. The visualized skeletal structures are unremarkable. IMPRESSION: No active disease. Electronically Signed   By: Sharlet Salina M.D.   On: 07/16/2020 17:21   ECHOCARDIOGRAM COMPLETE  Result Date: 07/20/2020    ECHOCARDIOGRAM REPORT   Patient Name:   ARISTOTELIS VILARDI Date of Exam: 07/20/2020 Medical Rec #:  [id number]     Height:  73.0 in Accession #:     [id number]    Weight:       262.3 lb Date of Birth:  1976/01/21     BSA:          2.415 m Patient Age:    44 years      BP:           153/106 mmHg Patient Gender: M             HR:           108 bpm. Exam Location:  Inpatient Procedure: 2D Echo, Color Doppler, Cardiac Doppler and Intracardiac            Opacification Agent Indications:    CHF-Acute Systolic I50.21  History:        Patient has prior history of Echocardiogram examinations, most                 recent 01/04/2017. Risk Factors:Hypertension, Dyslipidemia and                 Diabetes.  Sonographer:    Eulah Pont RDCS Referring Phys: [id number] VASUNDHRA RATHORE IMPRESSIONS  1. A small (8 mm diameter), almost spherical, slightly mobile ventricular apical thrombus is seen. Left ventricular ejection fraction, by estimation, is 20 to 25%. The left ventricle has severely decreased function. The left ventricle demonstrates global hypokinesis. The left ventricular internal cavity size was moderately dilated. Indeterminate diastolic filling due to E-A fusion.  2. Right ventricular systolic function is mildly reduced. The right ventricular size is normal. Tricuspid regurgitation signal is inadequate for assessing PA pressure.  3. Left atrial size was moderately dilated.  4. A small pericardial effusion is present. The pericardial effusion is localized near the right atrium.  5. The mitral valve is normal in structure. No evidence of mitral valve regurgitation. No evidence of mitral stenosis.  6. The aortic valve is tricuspid. Aortic valve regurgitation is not visualized. No aortic stenosis is present.  7. The inferior vena cava is normal in size with greater than 50% respiratory variability, suggesting right atrial pressure of 3 mmHg. Comparison(s): Prior images unable to be directly viewed, comparison made by report only. The left ventricular function is worsened. A new apical LV thrombus is seen. FINDINGS  Left Ventricle: A small (8 mm diameter), almost  spherical, slightly mobile ventricular apical thrombus is seen. Left ventricular ejection fraction, by estimation, is 20 to 25%. The left ventricle has severely decreased function. The left ventricle demonstrates global hypokinesis. Definity contrast agent was given IV to delineate the left ventricular endocardial borders. The left ventricular internal cavity size was moderately dilated. There is no left ventricular hypertrophy. Indeterminate diastolic filling due to E-A fusion. Right Ventricle: The right ventricular size is normal. No increase in right ventricular wall thickness. Right ventricular systolic function is mildly reduced. Tricuspid regurgitation signal is inadequate for assessing PA pressure. Left Atrium: Left atrial size was moderately dilated. Right Atrium: Right atrial size was normal in size. Pericardium: A small pericardial effusion is present. The pericardial effusion is localized near the right atrium. Mitral Valve: The mitral valve is normal in structure. No evidence of mitral valve regurgitation. No evidence of mitral valve stenosis. Tricuspid Valve: The tricuspid valve is normal in structure. Tricuspid valve regurgitation is not demonstrated. Aortic Valve: The aortic valve is tricuspid. Aortic valve regurgitation is not visualized. No aortic stenosis is present. Pulmonic Valve: The pulmonic valve was grossly normal. Pulmonic valve regurgitation is not visualized. Aorta: The  aortic root and ascending aorta are structurally normal, with no evidence of dilitation. Venous: The inferior vena cava is normal in size with greater than 50% respiratory variability, suggesting right atrial pressure of 3 mmHg. IAS/Shunts: No atrial level shunt detected by color flow Doppler.  LEFT VENTRICLE PLAX 2D LVIDd:         6.60 cm LVIDs:         5.40 cm LV PW:         1.10 cm LV IVS:        0.90 cm LVOT diam:     2.10 cm LV SV:         37 LV SV Index:   16 LVOT Area:     3.46 cm  RIGHT VENTRICLE RV S prime:      7.89 cm/s TAPSE (M-mode): 2.0 cm LEFT ATRIUM              Index       RIGHT ATRIUM           Index LA diam:        4.20 cm  1.74 cm/m  RA Area:     17.50 cm LA Vol (A2C):   98.6 ml  40.83 ml/m RA Volume:   46.80 ml  19.38 ml/m LA Vol (A4C):   101.0 ml 41.83 ml/m LA Biplane Vol: 103.0 ml 42.66 ml/m  AORTIC VALVE LVOT Vmax:   76.50 cm/s LVOT Vmean:  54.750 cm/s LVOT VTI:    0.108 m  AORTA Ao Root diam: 3.60 cm Ao Asc diam:  3.10 cm  SHUNTS Systemic VTI:  0.11 m Systemic Diam: 2.10 cm Rachelle Hora Croitoru MD Electronically signed by Thurmon Fair MD Signature Date/Time: 07/20/2020/1:15:21 PM    Final

## 2020-07-20 NOTE — Progress Notes (Signed)
STROKE TEAM PROGRESS NOTE   INTERVAL HISTORY No family at bedside.  Patient laying in bed, still has aphasia, limited verbal output and intermittently follows some simple commands.  However, no significant motor deficit.  MRI showed large left MCA stroke with punctate right frontal infarct. Patient has chronic cardiomyopathy, EF 30 to 35% in 2018, currently 2D echo showed EF 20 to 25% with apical thrombus.  Will start heparin drip per stroke protocol.  Vitals:   07/20/20 0400 07/20/20 0515 07/20/20 0825 07/20/20 0847  BP: (!) 136/99 (!) 153/106 (!) 139/97 (!) 139/97  Pulse: (!) 127 (!) 128 (!) 119 (!) 124  Resp: 18 17 (!) 23 (!) 25  Temp:    98.4 F (36.9 C)  TempSrc:    Oral  SpO2: 94% 98% 95% 93%   CBC:  Recent Labs  Lab 07/16/20 1634 07/19/20 1853 07/19/20 1921  WBC 4.8 6.5  --   NEUTROABS  --  5.0  --   HGB 13.2 14.1 15.3  HCT 41.2 44.2 45.0  MCV 81.7 79.2*  --   PLT 281 242  --    Basic Metabolic Panel:  Recent Labs  Lab 07/16/20 1634 07/19/20 1853 07/19/20 1921  NA 141 130* 133*  K 4.0 3.8 3.9  CL 105 94*  --   CO2 27 21*  --   GLUCOSE 141* 182*  --   BUN 14 15  --   CREATININE 1.02 1.19  --   CALCIUM 9.1 9.0  --   MG  --  1.8  --    Lipid Panel:  Recent Labs  Lab 07/20/20 0416  CHOL 145  TRIG 62  HDL 31*  CHOLHDL 4.7  VLDL 12  LDLCALC 789*   HgbA1c:  Recent Labs  Lab 07/20/20 0416  HGBA1C 7.7*   Urine Drug Screen: No results for input(s): LABOPIA, COCAINSCRNUR, LABBENZ, AMPHETMU, THCU, LABBARB in the last 168 hours.  Alcohol Level  Recent Labs  Lab 07/19/20 1856  ETH <10    IMAGING past 24 hours CT Angio Head W or Wo Contrast  Result Date: 07/19/2020 CLINICAL DATA:  Stroke-like symptoms. Expressive aphasia, headache, confusion EXAM: CT ANGIOGRAPHY HEAD AND NECK TECHNIQUE: Multidetector CT imaging of the head and neck was performed using the standard protocol during bolus administration of intravenous contrast. Multiplanar CT image  reconstructions and MIPs were obtained to evaluate the vascular anatomy. Carotid stenosis measurements (when applicable) are obtained utilizing NASCET criteria, using the distal internal carotid diameter as the denominator. CONTRAST:  63mL OMNIPAQUE IOHEXOL 350 MG/ML SOLN COMPARISON:  CT head 07/19/2020 FINDINGS: CTA NECK FINDINGS Aortic arch: Normal aortic arch and proximal great vessels. Bovine branching pattern. Right carotid system: Normal right carotid. Negative for stenosis or dissection. Left carotid system: Normal left carotid. Negative for stenosis or dissection. Vertebral arteries: Both vertebral arteries are normal and patent to the basilar. Right vertebral artery dominant. Skeleton: No acute skeletal abnormality. Other neck: Negative for mass or adenopathy in the neck. Upper chest: Small bilateral effusions. Patchy nodular airspace disease left upper lobe, not present on CT chest 07/17/2020. Possible pneumonia or edema. Review of the MIP images confirms the above findings CTA HEAD FINDINGS Anterior circulation: Normal cavernous carotid. Right anterior and middle cerebral arteries widely patent. Left anterior cerebral artery patent. Left M1 segment widely patent. Left MCA bifurcation normal. No large vessel occlusion. There is an area of hypoperfusion in the left parietal lobe corresponding to the hypodensity. This is consistent with recent infarct with distal  MCA occlusion Posterior circulation: Both vertebral arteries patent to the basilar. PICA patent bilaterally. Basilar widely patent. Posterior circulation normal without stenosis or large vessel occlusion. Venous sinuses: Normal venous enhancement. Anatomic variants: None Review of the MIP images confirms the above findings IMPRESSION: 1. Normal carotid and vertebral arteries in the neck 2. No intracranial large vessel occlusion. There is a small area of hypoperfusion in the left parietal lobe consistent with subacute infarct. This area shows  hypodensity on CT. Electronically Signed   By: Marlan Palau M.D.   On: 07/19/2020 20:58   DG Abd 1 View  Result Date: 07/20/2020 CLINICAL DATA:  Pre MRI EXAM: ABDOMEN - 1 VIEW COMPARISON:  CT 02/24/2020 FINDINGS: No implantable device seen. Patient's metallic belt buckle and zipper noted. Nonobstructive bowel gas pattern. No organomegaly or free air. IMPRESSION: No visible implantable device. Electronically Signed   By: Charlett Nose M.D.   On: 07/20/2020 07:35   CT Head Wo Contrast  Result Date: 07/19/2020 CLINICAL DATA:  Altered mental status. Stroke-like symptoms for 4 days. Expressive aphasia, headache, and confusion. EXAM: CT HEAD WITHOUT CONTRAST TECHNIQUE: Contiguous axial images were obtained from the base of the skull through the vertex without intravenous contrast. COMPARISON:  None. FINDINGS: Brain: Poorly defined area of low attenuation change extending to the cortical surface and involving the left posterior parietal lobe. This is consistent with acute or subacute infarct. Mild mass effect with effacement of sulci and left lateral ventricle. No midline shift. No abnormal extra-axial fluid collections. No acute intracranial hemorrhage. No ventricular dilatation. Vascular: Mild intracranial arterial vascular calcifications. Skull: Calvarium appears intact. Sinuses/Orbits: Paranasal sinuses and mastoid air cells are clear. Other: None. IMPRESSION: Low-attenuation in the left posterior parietal lobe consistent with acute or subacute infarct. Mild mass effect without midline shift. No acute intracranial hemorrhage. Electronically Signed   By: Burman Nieves M.D.   On: 07/19/2020 19:36   CT Angio Neck W and/or Wo Contrast  Result Date: 07/19/2020 CLINICAL DATA:  Stroke-like symptoms. Expressive aphasia, headache, confusion EXAM: CT ANGIOGRAPHY HEAD AND NECK TECHNIQUE: Multidetector CT imaging of the head and neck was performed using the standard protocol during bolus administration of  intravenous contrast. Multiplanar CT image reconstructions and MIPs were obtained to evaluate the vascular anatomy. Carotid stenosis measurements (when applicable) are obtained utilizing NASCET criteria, using the distal internal carotid diameter as the denominator. CONTRAST:  60mL OMNIPAQUE IOHEXOL 350 MG/ML SOLN COMPARISON:  CT head 07/19/2020 FINDINGS: CTA NECK FINDINGS Aortic arch: Normal aortic arch and proximal great vessels. Bovine branching pattern. Right carotid system: Normal right carotid. Negative for stenosis or dissection. Left carotid system: Normal left carotid. Negative for stenosis or dissection. Vertebral arteries: Both vertebral arteries are normal and patent to the basilar. Right vertebral artery dominant. Skeleton: No acute skeletal abnormality. Other neck: Negative for mass or adenopathy in the neck. Upper chest: Small bilateral effusions. Patchy nodular airspace disease left upper lobe, not present on CT chest 07/17/2020. Possible pneumonia or edema. Review of the MIP images confirms the above findings CTA HEAD FINDINGS Anterior circulation: Normal cavernous carotid. Right anterior and middle cerebral arteries widely patent. Left anterior cerebral artery patent. Left M1 segment widely patent. Left MCA bifurcation normal. No large vessel occlusion. There is an area of hypoperfusion in the left parietal lobe corresponding to the hypodensity. This is consistent with recent infarct with distal MCA occlusion Posterior circulation: Both vertebral arteries patent to the basilar. PICA patent bilaterally. Basilar widely patent. Posterior circulation normal without stenosis  or large vessel occlusion. Venous sinuses: Normal venous enhancement. Anatomic variants: None Review of the MIP images confirms the above findings IMPRESSION: 1. Normal carotid and vertebral arteries in the neck 2. No intracranial large vessel occlusion. There is a small area of hypoperfusion in the left parietal lobe consistent  with subacute infarct. This area shows hypodensity on CT. Electronically Signed   By: Marlan Palau M.D.   On: 07/19/2020 20:58   MR Brain Wo Contrast (neuro protocol)  Result Date: 07/20/2020 CLINICAL DATA:  44 year old male with stroke-like symptoms for 4 days. 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 COVID-19. EXAM: MRI HEAD WITHOUT CONTRAST TECHNIQUE: Multiplanar, multiecho pulse sequences of the brain and surrounding structures were obtained without intravenous contrast. COMPARISON:  CT head, CTA head and neck yesterday. FINDINGS: Brain: Confluent restricted diffusion from the posterior temporal lobe through the lateral left occipital lobe tracking to the left occipital pole, an area of 9.5 cm (series 2, image 29) corresponding to the CT finding yesterday. Multifocal petechial hemorrhage (series 7, image 61) and confluent cytotoxic edema but no malignant hemorrhagic transformation and only mild mass effect on the left lateral ventricle without midline shift. Superimposed small cortical restricted diffusion at the right superior parietal lobe (series 2, image 44). Mild if any associated T2 and FLAIR hyperintensity. No hemorrhage. No other restricted diffusion. No midline shift, evidence of mass lesion, ventriculomegaly, extra-axial collection. Patchy and nodular superimposed bilateral cerebral white matter T2 and FLAIR hyperintensity in a nonspecific configuration. The corpus callosum and temporal lobes are spared. No chronic cortical encephalomalacia. But there do appear to be several chronic microhemorrhages in the left lentiform nuclei (series 7, images 52 and 56). No other chronic cerebral blood products. Elsewhere the deep gray nuclei, brainstem and cerebellum are within normal limits. Cervicomedullary junction and pituitary are within normal limits. Vascular: Major intracranial vascular flow voids are preserved. Skull and upper cervical spine:  Negative. Sinuses/Orbits: Disconjugate gaze otherwise negative orbits. Paranasal sinuses and mastoids are stable and well pneumatized. Other: None. IMPRESSION: 1. Large acute infarct in the posterior left hemisphere with petechial hemorrhage and cytotoxic edema, but no malignant hemorrhagic transformation or significant intracranial mass effect. 2. Solitary small acute cortical infarct superimposed in the superior right parietal lobe. 3. Chronic micro-hemorrhages in the left lentiform nuclei. Bilateral periventricular white matter signal changes which may therefore be chronic small vessel disease related. Electronically Signed   By: Odessa Fleming M.D.   On: 07/20/2020 12:00   DG Chest Portable 1 View  Result Date: 07/19/2020 CLINICAL DATA:  Fever, altered level of consciousness EXAM: PORTABLE CHEST 1 VIEW COMPARISON:  07/17/2020 FINDINGS: Single frontal view of the chest demonstrates an enlarged cardiac silhouette. Increased central vascular congestion, with interval development of bilateral interstitial prominence. No large effusion or pneumothorax. No acute bony abnormalities. IMPRESSION: 1. Worsening volume status, with mild interstitial edema on today's exam. Electronically Signed   By: Sharlet Salina M.D.   On: 07/19/2020 18:49    PHYSICAL EXAM  Temp:  [98.4 F (36.9 C)-99.2 F (37.3 C)] 98.4 F (36.9 C) (12/31 0847) Pulse Rate:  [83-140] 90 (12/31 1600) Resp:  [12-26] 18 (12/31 1600) BP: (106-155)/(68-106) 107/68 (12/31 1600) SpO2:  [93 %-99 %] 96 % (12/31 1600) Weight:  [932 kg] 119 kg (12/31 1400)  General - Well nourished, well developed, in no apparent distress.  Ophthalmologic - fundi not visualized due to noncooperation.  Cardiovascular - Regular rhythm and rate.  Neuro -  awake alert, eyes open, receptive aphasia > expressive aphasia.  Able to have limited language output, however not able to repeat or name.  Intermittently follow some simple commands, but not all the commands. PERRL,  EOMI, tracking bilaterally.  Inconsistently blinking to visual threat bilaterally.  No significant facial droop.  Tongue protrusion midline.  Bilateral upper and lower extremity symmetrical strength. FTN intact bilaterally with prompt.  Sensation not corporative.  Gait not tested   ASSESSMENT/PLAN Mr. Payne Teskey is a 44 y.o. male with history of diabetes, hypertension, hyperlipidemia, noncompliance to medications, nonischemic cardiomyopathy presenting with confusion and altered mental status w/ 3d hx SOB, CP. 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   CTA head no LVO. L parietal hypodensity  CTA neck Unremarkable   MRI  Large L MCA (posterior temporal to L occipital) infarct w/ petechial hemorrhagic transformation and cytotoxic edema.  Punctate R parietal cortical infarct. Chronic L lentiform nucleus microhemorrhages. B periventricular white matter disease.  2D Echo apical thrombus. EF 20-25%.   LDL 102  HgbA1c 7.7  VTE prophylaxis - Heparin IV per stroke protocol  aspirin 81 mg daily prior to admission, now on  heparin IV per stroke protocol  Therapy recommendations:  pending   Disposition:  pending   Chronic cardiomyopathy LV thrombus  12/2016 EF 30 to 35%  This admission EF 20-25%  LV apical thrombus found on TTE  Now on heparin IV per stroke protocol  Cardiology consulted  Severe Sepsis secondary to COVID-19 PNA  Positive test on admission (neg 3 days prior)  TM 102.2  CXR w/ pulmonary vascular congestion  D-dimer 1.27  Fibrinogen 608  On remdesivir, steroids   Vanc, Zosyn 12/31>>  Hypertension  Stable on the low side . avoid low BP . 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 This patient's care requiresreview of multiple databases, neurological assessment, discussion with family, other specialists and medical decision making of high complexity. I have discussed with cardiology PA and Dr. Seth Bake.  Marvel Plan, MD PhD Stroke Neurology 07/20/2020 6:41 PM   To contact Stroke Continuity provider, please refer to [email or web address]. After hours, contact General Neurology

## 2020-07-20 NOTE — ED Notes (Signed)
Pt transported to MRI via gerry chair in stable condition

## 2020-07-20 NOTE — ED Notes (Signed)
Dinner Tray Ordered @ 1704. 

## 2020-07-20 NOTE — Progress Notes (Signed)
  Echocardiogram 2D Echocardiogram has been performed.  Rodney Kane 07/20/2020, 10:33 AM

## 2020-07-20 NOTE — ED Notes (Signed)
MD Rathore at bedside  °

## 2020-07-20 NOTE — Consult Note (Addendum)
Cardiology Consultation:   Patient ID: Rodney Kane MRN: [id number]; DOB: [birth year]  Admit date: 07/19/2020 Date of Consult: 07/20/2020  Primary Care Provider: Azzie Glatter, FNP [facility] Cardiologist: previously seen by Dr. Ellyn Hack in June 2018 [place] Electrophysiologist:  None    Patient Profile:   Rodney Kane is a 44 y.o. male with a hx of uncontrolled hypertension, DM 2, nonischemic cardiomyopathy diagnosed in June 2018 and history of medication noncompliance who is being seen today for the evaluation of LV thrombus and drop in EF at the request of Dr. Waldron Labs.  History of Present Illness:   Rodney Kane is a 44 year old male with past medical history of uncontrolled hypertension, DM 2, nonischemic cardiomyopathy diagnosed in June 2018 and history of medication noncompliance.  Patient was previously admitted with hypertensive urgency in June 2018.  Echocardiogram obtained on 01/04/2017 showed EF 30 to 35%, moderate LVH, diffuse hypokinesis.  Cardiac catheterization performed on 01/05/2017 showed normal coronary arteries.  He was seen once for follow-up however lost to follow-up after that.  More recently, patient was seen on 07/16/2020 with precordial chest pain.  D-dimer was borderline elevated.  CTA of the chest was negative for PE, mild left ventricular dilatation, small right pleural effusion.  Patient was subsequently released from the emergency room however came back on 07/19/2020 due to increased confusion and altered mental status.  He subsequently developed expressive aphasia.  Covid test came back positive.  EKG showed sinus tachycardia, nonspecific ST changes.  Chest x-ray were revealed worsening volume status with mild interstitial edema.  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  Echocardiogram obtained on 07/20/2020 showed 8 mm mobile ventricular apical thrombus, EF 20 to 25%, mildly reduced RVEF, small pericardial effusion, no significant valve issue.    Past Medical History:  Diagnosis Date  . Diabetes mellitus without complication ([place])   . Hemoglobin A1C greater than 9%, indicating poor diabetic control 12/2019  . Hyperglycemia 12/2019  . Hyperlipidemia 12/2019  . Hypertension   . Hypertensive urgency 12/2019  . Noncompliance with medication regimen 12/2019  . Urine ketones 12/2019  . Vitamin D deficiency 12/2019    Past Surgical History:  Procedure Laterality Date  . left knee surgery    . RIGHT/LEFT HEART CATH AND CORONARY ANGIOGRAPHY N/A 01/05/2017   Procedure: Right/Left Heart Cath and Coronary Angiography;  Surgeon: Leonie Man, MD;  Location: [facility];  Service: Cardiovascular;  Laterality: N/A;     Home Medications:  Prior to Admission medications   Medication Sig Start Date End Date Taking? Authorizing Provider  amLODipine (NORVASC) 10 MG tablet Take 1 tablet (10 mg total) by mouth daily. 01/04/20  Yes Azzie Glatter, FNP  atorvastatin (LIPITOR) 10 MG tablet Take 1 tablet (10 mg total) by mouth daily. 01/05/20  Yes Azzie Glatter, FNP  carvedilol (COREG) 12.5 MG tablet Take 1 tablet (12.5 mg total) by mouth 2 (two) times daily with a meal. 01/04/20  Yes Azzie Glatter, FNP  glipiZIDE (GLUCOTROL) 10 MG tablet Take 1 tablet (10 mg total) by mouth 2 (two) times daily before a meal. 01/04/20  Yes Azzie Glatter, FNP  insulin aspart (NOVOLOG) 100 UNIT/ML injection Inject 15 Units into the skin 3 (three) times daily with meals. 06/29/18  Yes Azzie Glatter, FNP  insulin glargine (LANTUS) 100 unit/mL SOPN Inject 0.5 mLs (50 Units total) into the skin at bedtime. 01/04/20  Yes Azzie Glatter, FNP  metFORMIN (GLUCOPHAGE) 1000 MG tablet Take 1 tablet (1,000 mg total) by mouth 2 (two) times daily with  a meal. 01/04/20  Yes Azzie Glatter, FNP  potassium chloride SA (K-DUR,KLOR-CON) 20 MEQ tablet Take 1 tablet (20 mEq total) by mouth daily. 10/07/17  Yes Scot Jun, FNP  Pseudoephedrine-APAP-DM (DAYQUIL PO) Take 30 mLs by mouth every 6 (six) hours as needed (cold symptoms).   Yes [provider]  Vitamin D, Ergocalciferol, (DRISDOL) 1.25 MG (50000 UNIT) CAPS capsule Take 1 capsule (50,000 Units total) by mouth every 7 (seven) days. Patient taking differently: Take 50,000 Units by mouth every Saturday. 01/05/20  Yes Azzie Glatter, FNP  aspirin 81 MG EC tablet Take 1 tablet (81 mg total) by mouth daily. Patient not taking: No sig reported 10/07/17   Scot Jun, FNP  blood glucose meter kit and supplies Dispense based on patient and insurance preference. Use up to four times daily for blood glucose readings greater than 125. 06/29/18   Azzie Glatter, FNP  losartan (COZAAR) 25 MG tablet Take 1 tablet (25 mg total) by mouth daily. 06/29/18 02/24/20  Azzie Glatter, FNP  omeprazole (PRILOSEC) 40 MG capsule Take 1 capsule (40 mg total) by mouth daily. Patient not taking: No sig reported 06/29/18   Azzie Glatter, FNP    Inpatient Medications: Scheduled Meds: . vitamin C  500 mg Oral Daily  . atorvastatin  80 mg Oral Daily  . carvedilol  12.5 mg Oral BID WC  . cholecalciferol  1,000 Units Oral Daily  . dexamethasone (DECADRON) injection  6 mg Intravenous Daily  . doxycycline  100 mg Oral Q12H  . insulin aspart  0-5 Units Subcutaneous QHS  . insulin aspart  0-9 Units Subcutaneous TID WC  . insulin lispro  20 Units Subcutaneous Once  . zinc sulfate  220 mg Oral Daily   Continuous Infusions: . cefTRIAXone (ROCEPHIN)  IV 1 g (07/20/20 1547)  . heparin 1,500 Units/hr (07/20/20 1447)  . [START ON 07/21/2020] remdesivir 100 mg in NS 100 mL     PRN Meds: acetaminophen  Allergies:   No Known Allergies  Social History:   Social History   Socioeconomic History   . Marital status: Divorced    Spouse name: Not on file  . Number of children: 3  . Years of education: Not on file  . Highest education level: Not on file  Occupational History  . Not on file  Tobacco Use  . Smoking status: Never Smoker  . Smokeless tobacco: Never Used  Vaping Use  . Vaping Use: Never used  Substance and Sexual Activity  . Alcohol use: No  . Drug use: No  . Sexual activity: Yes  Other Topics Concern  . Not on file  Social History Narrative  . Not on file   Social Determinants of Health   Financial Resource Strain: Not on file  Food Insecurity: Not on file  Transportation Needs: Not on file  Physical Activity: Not on file  Stress: Not on file  Social Connections: Not on file  Intimate Partner Violence: Not on file    Family History:    Family History  Problem Relation Age of Onset  . Multiple sclerosis Mother   . Diabetic kidney disease Mother   . Hypertension Mother      ROS:  Please see the history of present illness.   All other ROS reviewed and negative.     Physical Exam/Data:   Vitals:   07/20/20 1231 07/20/20 1400 07/20/20 1415 07/20/20 1445  BP:   106/72   Pulse: 93  83 89  Resp: 16  (!) 21 (!) 26  Temp:      TempSrc:      SpO2: 94%  93% 96%  Weight:  119 kg    Height:  6' 1"  (1.854 m)      Intake/Output Summary (Last 24 hours) at 07/20/2020 1558 Last data filed at 07/20/2020 0238 Gross per 24 hour  Intake 1275.11 ml  Output 1150 ml  Net 125.11 ml   Last 3 Weights 07/20/2020 07/16/2020 02/23/2020  Weight (lbs) 262 lb 5.6 oz 262 lb 5.6 oz 264 lb  Weight (kg) 119 kg 119 kg 119.75 kg     Body mass index is 34.61 kg/m.  General:  Well nourished, well developed, in no acute distress HEENT: normal Lymph: no adenopathy Neck: no JVD Endocrine:  No thryomegaly Vascular: No carotid bruits; FA pulses 2+ bilaterally without bruits  Cardiac:  normal S1, S2; RRR; no murmur  Lungs:  clear to auscultation bilaterally, no wheezing,  rhonchi or rales  Abd: soft, nontender, no hepatomegaly  Ext: no edema Musculoskeletal:  No deformities, BUE and BLE strength normal and equal Skin: warm and dry  Neuro:  CNs 2-12 intact, no focal abnormalities noted Psych:  Normal affect   EKG:  The EKG was personally reviewed and demonstrates: Sinus tachycardia, nonspecific ST changes Telemetry:  Telemetry was personally reviewed and demonstrates: Normal sinus rhythm  Relevant CV Studies:  Echo 07/20/2020 1. A small (8 mm diameter), almost spherical, slightly mobile ventricular  apical thrombus is seen. Left ventricular ejection fraction, by  estimation, is 20 to 25%. The left ventricle has severely decreased  function. The left ventricle demonstrates  global hypokinesis. The left ventricular internal cavity size was  moderately dilated. Indeterminate diastolic filling due to E-A fusion.  2. Right ventricular systolic function is mildly reduced. The right  ventricular size is normal. Tricuspid regurgitation signal is inadequate  for assessing PA pressure.  3. Left atrial size was moderately dilated.  4. A small pericardial effusion is present. The pericardial effusion is  localized near the right atrium.  5. The mitral valve is normal in structure. No evidence of mitral valve  regurgitation. No evidence of mitral stenosis.  6. The aortic valve is tricuspid. Aortic valve regurgitation is not  visualized. No aortic stenosis is present.  7. The inferior vena cava is normal in size with greater than 50%  respiratory variability, suggesting right atrial pressure of 3 mmHg.   Comparison(s): Prior images unable to be directly viewed, comparison made  by report only. The left ventricular function is worsened. A new apical LV  thrombus is seen.   Laboratory Data:  High Sensitivity Troponin:   Recent Labs  Lab 07/16/20 1634 07/16/20 2106 07/19/20 1907 07/19/20 2107  TROPONINIHS 13 12 45* 49*     Chemistry Recent Labs   Lab 07/16/20 1634 07/19/20 1853 07/19/20 1921  NA 141 130* 133*  K 4.0 3.8 3.9  CL 105 94*  --   CO2 27 21*  --   GLUCOSE 141* 182*  --   BUN 14 15  --   CREATININE 1.02 1.19  --   CALCIUM 9.1 9.0  --   GFRNONAA >60 >60  --   ANIONGAP 9  15  --     Recent Labs  Lab 07/19/20 1853  PROT 7.4  ALBUMIN 3.6  AST 22  ALT 21  ALKPHOS 48  BILITOT 1.0   Hematology Recent Labs  Lab 07/16/20 1634 07/19/20 1853 07/19/20 1921  WBC 4.8 6.5  --   RBC 5.04 5.58  --   HGB 13.2 14.1 15.3  HCT 41.2 44.2 45.0  MCV 81.7 79.2*  --   MCH 26.2 25.3*  --   MCHC 32.0 31.9  --   RDW 13.4 12.8  --   PLT 281 242  --    BNP Recent Labs  Lab 07/19/20 1907  BNP 1,619.5*    DDimer  Recent Labs  Lab 07/16/20 2229 07/19/20 2107  DDIMER 0.58* 1.27*     Radiology/Studies:  CT Angio Head W or Wo Contrast  Result Date: 07/19/2020 CLINICAL DATA:  Stroke-like symptoms. Expressive aphasia, headache, confusion EXAM: CT ANGIOGRAPHY HEAD AND NECK TECHNIQUE: Multidetector CT imaging of the head and neck was performed using the standard protocol during bolus administration of intravenous contrast. Multiplanar CT image reconstructions and MIPs were obtained to evaluate the vascular anatomy. Carotid stenosis measurements (when applicable) are obtained utilizing NASCET criteria, using the distal internal carotid diameter as the denominator. CONTRAST:  7m OMNIPAQUE IOHEXOL 350 MG/ML SOLN COMPARISON:  CT head 07/19/2020 FINDINGS: CTA NECK FINDINGS Aortic arch: Normal aortic arch and proximal great vessels. Bovine branching pattern. Right carotid system: Normal right carotid. Negative for stenosis or dissection. Left carotid system: Normal left carotid. Negative for stenosis or dissection. Vertebral arteries: Both vertebral arteries are normal and patent to the basilar. Right vertebral artery dominant. Skeleton: No acute skeletal abnormality. Other neck: Negative for mass or adenopathy in the neck. Upper  chest: Small bilateral effusions. Patchy nodular airspace disease left upper lobe, not present on CT chest 07/17/2020. Possible pneumonia or edema. Review of the MIP images confirms the above findings CTA HEAD FINDINGS Anterior circulation: Normal cavernous carotid. Right anterior and middle cerebral arteries widely patent. Left anterior cerebral artery patent. Left M1 segment widely patent. Left MCA bifurcation normal. No large vessel occlusion. There is an area of hypoperfusion in the left parietal lobe corresponding to the hypodensity. This is consistent with recent infarct with distal MCA occlusion Posterior circulation: Both vertebral arteries patent to the basilar. PICA patent bilaterally. Basilar widely patent. Posterior circulation normal without stenosis or large vessel occlusion. Venous sinuses: Normal venous enhancement. Anatomic variants: None Review of the MIP images confirms the above findings IMPRESSION: 1. Normal carotid and vertebral arteries in the neck 2. No intracranial large vessel occlusion. There is a small area of hypoperfusion in the left parietal lobe consistent with subacute infarct. This area shows hypodensity on CT. Electronically Signed   By: CFranchot GalloM.D.   On: 07/19/2020 20:58   DG Abd 1 View  Result Date: 07/20/2020 CLINICAL DATA:  Pre MRI EXAM: ABDOMEN - 1 VIEW COMPARISON:  CT 02/24/2020 FINDINGS: No implantable device seen. Patient's metallic belt buckle and zipper noted. Nonobstructive bowel gas pattern. No organomegaly or free air. IMPRESSION: No visible implantable device. Electronically Signed   By: KRolm BaptiseM.D.   On: 07/20/2020 07:35   CT Head Wo Contrast  Result Date: 07/19/2020 CLINICAL DATA:  Altered mental status. Stroke-like symptoms for 4 days. Expressive aphasia, headache, and confusion. EXAM: CT HEAD WITHOUT CONTRAST TECHNIQUE: Contiguous axial images were obtained from the base of the skull through the vertex without intravenous contrast.  COMPARISON:  None.  FINDINGS: Brain: Poorly defined area of low attenuation change extending to the cortical surface and involving the left posterior parietal lobe. This is consistent with acute or subacute infarct. Mild mass effect with effacement of sulci and left lateral ventricle. No midline shift. No abnormal extra-axial fluid collections. No acute intracranial hemorrhage. No ventricular dilatation. Vascular: Mild intracranial arterial vascular calcifications. Skull: Calvarium appears intact. Sinuses/Orbits: Paranasal sinuses and mastoid air cells are clear. Other: None. IMPRESSION: Low-attenuation in the left posterior parietal lobe consistent with acute or subacute infarct. Mild mass effect without midline shift. No acute intracranial hemorrhage. Electronically Signed   By: Lucienne Capers M.D.   On: 07/19/2020 19:36   CT Angio Neck W and/or Wo Contrast  Result Date: 07/19/2020 CLINICAL DATA:  Stroke-like symptoms. Expressive aphasia, headache, confusion EXAM: CT ANGIOGRAPHY HEAD AND NECK TECHNIQUE: Multidetector CT imaging of the head and neck was performed using the standard protocol during bolus administration of intravenous contrast. Multiplanar CT image reconstructions and MIPs were obtained to evaluate the vascular anatomy. Carotid stenosis measurements (when applicable) are obtained utilizing NASCET criteria, using the distal internal carotid diameter as the denominator. CONTRAST:  46m OMNIPAQUE IOHEXOL 350 MG/ML SOLN COMPARISON:  CT head 07/19/2020 FINDINGS: CTA NECK FINDINGS Aortic arch: Normal aortic arch and proximal great vessels. Bovine branching pattern. Right carotid system: Normal right carotid. Negative for stenosis or dissection. Left carotid system: Normal left carotid. Negative for stenosis or dissection. Vertebral arteries: Both vertebral arteries are normal and patent to the basilar. Right vertebral artery dominant. Skeleton: No acute skeletal abnormality. Other neck: Negative for  mass or adenopathy in the neck. Upper chest: Small bilateral effusions. Patchy nodular airspace disease left upper lobe, not present on CT chest 07/17/2020. Possible pneumonia or edema. Review of the MIP images confirms the above findings CTA HEAD FINDINGS Anterior circulation: Normal cavernous carotid. Right anterior and middle cerebral arteries widely patent. Left anterior cerebral artery patent. Left M1 segment widely patent. Left MCA bifurcation normal. No large vessel occlusion. There is an area of hypoperfusion in the left parietal lobe corresponding to the hypodensity. This is consistent with recent infarct with distal MCA occlusion Posterior circulation: Both vertebral arteries patent to the basilar. PICA patent bilaterally. Basilar widely patent. Posterior circulation normal without stenosis or large vessel occlusion. Venous sinuses: Normal venous enhancement. Anatomic variants: None Review of the MIP images confirms the above findings IMPRESSION: 1. Normal carotid and vertebral arteries in the neck 2. No intracranial large vessel occlusion. There is a small area of hypoperfusion in the left parietal lobe consistent with subacute infarct. This area shows hypodensity on CT. Electronically Signed   By: CFranchot GalloM.D.   On: 07/19/2020 20:58   CT Angio Chest PE W and/or Wo Contrast  Result Date: 07/17/2020 CLINICAL DATA:  Dyspnea, left chest pain, productive cough EXAM: CT ANGIOGRAPHY CHEST WITH CONTRAST TECHNIQUE: Multidetector CT imaging of the chest was performed using the standard protocol during bolus administration of intravenous contrast. Multiplanar CT image reconstructions and MIPs were obtained to evaluate the vascular anatomy. CONTRAST:  79mOMNIPAQUE IOHEXOL 350 MG/ML SOLN COMPARISON:  None FINDINGS: Cardiovascular: There is adequate opacification of the pulmonary arterial tree. There is no intraluminal filling defect identified to suggest acute pulmonary embolism. Central pulmonary  arteries are of normal caliber. No significant coronary artery calcification. Global cardiac size is within normal limits. Mild left ventricular dilation, however, is noted. No pericardial effusion. The thoracic aorta is unremarkable. Mediastinum/Nodes: No enlarged mediastinal, hilar, or axillary lymph nodes.  Thyroid gland, trachea, and esophagus demonstrate no significant findings. Lungs/Pleura: Small right pleural effusion is present with minimal associated right basilar atelectasis. The lungs are otherwise clear. No pneumothorax. No central obstructing lesion. Upper Abdomen: No acute abnormality. Musculoskeletal: Osseous structures are age-appropriate. Gynecomastia noted. Review of the MIP images confirms the above findings. IMPRESSION: No pulmonary embolism. Mild left ventricular dilation. This could be better assessed with echocardiography. Small right pleural effusion. Electronically Signed   By: Fidela Salisbury MD   On: 07/17/2020 01:55   MR Brain Wo Contrast (neuro protocol)  Result Date: 07/20/2020 CLINICAL DATA:  44 year old male with stroke-like symptoms for 4 days. 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 COVID-19. EXAM: MRI HEAD WITHOUT CONTRAST TECHNIQUE: Multiplanar, multiecho pulse sequences of the brain and surrounding structures were obtained without intravenous contrast. COMPARISON:  CT head, CTA head and neck yesterday. FINDINGS: Brain: Confluent restricted diffusion from the posterior temporal lobe through the lateral left occipital lobe tracking to the left occipital pole, an area of 9.5 cm (series 2, image 29) corresponding to the CT finding yesterday. Multifocal petechial hemorrhage (series 7, image 61) and confluent cytotoxic edema but no malignant hemorrhagic transformation and only mild mass effect on the left lateral ventricle without midline shift. Superimposed small cortical restricted diffusion at the right superior parietal  lobe (series 2, image 44). Mild if any associated T2 and FLAIR hyperintensity. No hemorrhage. No other restricted diffusion. No midline shift, evidence of mass lesion, ventriculomegaly, extra-axial collection. Patchy and nodular superimposed bilateral cerebral white matter T2 and FLAIR hyperintensity in a nonspecific configuration. The corpus callosum and temporal lobes are spared. No chronic cortical encephalomalacia. But there do appear to be several chronic microhemorrhages in the left lentiform nuclei (series 7, images 52 and 56). No other chronic cerebral blood products. Elsewhere the deep gray nuclei, brainstem and cerebellum are within normal limits. Cervicomedullary junction and pituitary are within normal limits. Vascular: Major intracranial vascular flow voids are preserved. Skull and upper cervical spine: Negative. Sinuses/Orbits: Disconjugate gaze otherwise negative orbits. Paranasal sinuses and mastoids are stable and well pneumatized. Other: None. IMPRESSION: 1. Large acute infarct in the posterior left hemisphere with petechial hemorrhage and cytotoxic edema, but no malignant hemorrhagic transformation or significant intracranial mass effect. 2. Solitary small acute cortical infarct superimposed in the superior right parietal lobe. 3. Chronic micro-hemorrhages in the left lentiform nuclei. Bilateral periventricular white matter signal changes which may therefore be chronic small vessel disease related. Electronically Signed   By: Genevie Ann M.D.   On: 07/20/2020 12:00   DG Chest Portable 1 View  Result Date: 07/19/2020 CLINICAL DATA:  Fever, altered level of consciousness EXAM: PORTABLE CHEST 1 VIEW COMPARISON:  07/17/2020 FINDINGS: Single frontal view of the chest demonstrates an enlarged cardiac silhouette. Increased central vascular congestion, with interval development of bilateral interstitial prominence. No large effusion or pneumothorax. No acute bony abnormalities. IMPRESSION: 1. Worsening  volume status, with mild interstitial edema on today's exam. Electronically Signed   By: Randa Ngo M.D.   On: 07/19/2020 18:49   DG Chest Port 1 View  Result Date: 07/16/2020 CLINICAL DATA:  Intermittent sharp left-sided chest pain, short of breath, productive cough EXAM: PORTABLE CHEST 1 VIEW COMPARISON:  05/29/2018 FINDINGS: The heart size and mediastinal contours are within normal limits. Both lungs are clear. The visualized skeletal structures are unremarkable. IMPRESSION: No active disease. Electronically Signed   By: Randa Ngo M.D.   On: 07/16/2020 17:21  ECHOCARDIOGRAM COMPLETE  Result Date: 07/20/2020    ECHOCARDIOGRAM REPORT   Patient Name:   RAYNOR CALCATERRA Date of Exam: 07/20/2020 Medical Rec #:  [id number]     Height:       73.0 in Accession #:    [id number]    Weight:       262.3 lb Date of Birth:  September 19, 1975     BSA:          2.415 m Patient Age:    66 years      BP:           153/106 mmHg Patient Gender: M             HR:           108 bpm. Exam Location:  Inpatient Procedure: 2D Echo, Color Doppler, Cardiac Doppler and Intracardiac            Opacification Agent Indications:    CHF-Acute Systolic L24.40  History:        Patient has prior history of Echocardiogram examinations, most                 recent 01/04/2017. Risk Factors:Hypertension, Dyslipidemia and                 Diabetes.  Sonographer:    Bernadene Person RDCS Referring Phys: [id number] [place]  1. A small (8 mm diameter), almost spherical, slightly mobile ventricular apical thrombus is seen. Left ventricular ejection fraction, by estimation, is 20 to 25%. The left ventricle has severely decreased function. The left ventricle demonstrates global hypokinesis. The left ventricular internal cavity size was moderately dilated. Indeterminate diastolic filling due to E-A fusion.  2. Right ventricular systolic function is mildly reduced. The right ventricular size is normal. Tricuspid regurgitation signal is  inadequate for assessing PA pressure.  3. Left atrial size was moderately dilated.  4. A small pericardial effusion is present. The pericardial effusion is localized near the right atrium.  5. The mitral valve is normal in structure. No evidence of mitral valve regurgitation. No evidence of mitral stenosis.  6. The aortic valve is tricuspid. Aortic valve regurgitation is not visualized. No aortic stenosis is present.  7. The inferior vena cava is normal in size with greater than 50% respiratory variability, suggesting right atrial pressure of 3 mmHg. Comparison(s): Prior images unable to be directly viewed, comparison made by report only. The left ventricular function is worsened. A new apical LV thrombus is seen. FINDINGS  Left Ventricle: A small (8 mm diameter), almost spherical, slightly mobile ventricular apical thrombus is seen. Left ventricular ejection fraction, by estimation, is 20 to 25%. The left ventricle has severely decreased function. The left ventricle demonstrates global hypokinesis. Definity contrast agent was given IV to delineate the left ventricular endocardial borders. The left ventricular internal cavity size was moderately dilated. There is no left ventricular hypertrophy. Indeterminate diastolic filling due to E-A fusion. Right Ventricle: The right ventricular size is normal. No increase in right ventricular wall thickness. Right ventricular systolic function is mildly reduced. Tricuspid regurgitation signal is inadequate for assessing PA pressure. Left Atrium: Left atrial size was moderately dilated. Right Atrium: Right atrial size was normal in size. Pericardium: A small pericardial effusion is present. The pericardial effusion is localized near the right atrium. Mitral Valve: The mitral valve is normal in structure. No evidence of mitral valve regurgitation. No evidence of mitral valve stenosis. Tricuspid Valve: The tricuspid valve is normal in structure. Tricuspid valve  regurgitation is  not demonstrated. Aortic Valve: The aortic valve is tricuspid. Aortic valve regurgitation is not visualized. No aortic stenosis is present. Pulmonic Valve: The pulmonic valve was grossly normal. Pulmonic valve regurgitation is not visualized. Aorta: The aortic root and ascending aorta are structurally normal, with no evidence of dilitation. Venous: The inferior vena cava is normal in size with greater than 50% respiratory variability, suggesting right atrial pressure of 3 mmHg. IAS/Shunts: No atrial level shunt detected by color flow Doppler.  LEFT VENTRICLE PLAX 2D LVIDd:         6.60 cm LVIDs:         5.40 cm LV PW:         1.10 cm LV IVS:        0.90 cm LVOT diam:     2.10 cm LV SV:         37 LV SV Index:   16 LVOT Area:     3.46 cm  RIGHT VENTRICLE RV S prime:     7.89 cm/s TAPSE (M-mode): 2.0 cm LEFT ATRIUM              Index       RIGHT ATRIUM           Index LA diam:        4.20 cm  1.74 cm/m  RA Area:     17.50 cm LA Vol (A2C):   98.6 ml  40.83 ml/m RA Volume:   46.80 ml  19.38 ml/m LA Vol (A4C):   101.0 ml 41.83 ml/m LA Biplane Vol: 103.0 ml 42.66 ml/m  AORTIC VALVE LVOT Vmax:   76.50 cm/s LVOT Vmean:  54.750 cm/s LVOT VTI:    0.108 m  AORTA Ao Root diam: 3.60 cm Ao Asc diam:  3.10 cm  SHUNTS Systemic VTI:  0.11 m Systemic Diam: 2.10 cm Dani Gobble Croitoru MD Electronically signed by Sanda Klein MD Signature Date/Time: 07/20/2020/1:15:21 PM    Final      Assessment and Plan:   1. Apical thrombus  -Likely related to recent CVA.  Patient has been started on IV heparin.  2. Large CVA: Followed by neurology service.   3. COVID-19: Managed by hospitalist service  4. Chronic systolic heart failure with drop in ejection fraction: Previous EF 30 to 35% in 2018, repeat echocardiogram today showed EF 20 to 25%.  This drop in ejection fraction may be related to history of uncontrolled hypertension.  -BNP 1619.5. Does not appears to be volume overloaded.   - will continue coreg for now. May  consider addition of entresto in the future.   5. History of uncontrolled hypertension: back on Coreg  6. DM2  7. Nonischemic cardiomyopathy: Previous cardiac catheterization in 2018 showed normal coronary arteries. Mildly elevated troponin related to CVA, no plan for ischemic workup.    For questions or updates, please contact [place] Please consult [email or web address] for contact info under    Signed, Almyra Deforest, [place]  07/20/2020 3:58 PM  Agree with the above note from PA.   Patient admitted with expressive aphasia from acute CVA and COVID-19 infection. MRI head with large posterior left hemisphere CVA.  Echo was done, EF 20-25% with apical thrombus.  Patient has history of nonischemic cardiomyopathy (normal coronaries on 2018 cath), prior echo in 2018 with EF 30-35%.  Cardiomyopathy may be due to HTN.  Patient has been noncompliant with followup, not seen in cardiology clinic since 2018.    Suspect CVA is cardioembolic from  LV thrombus.  - When ok with neurology, would start on heparin gtt as bridge to therapeutic warfarin.  Only data for LV thrombus treatment is for warfarin, so would use this rather than DOAC.   Not sure how compliant he has been with home meds, but will be off most antihypertensives for now with acute CVA.  Agree with continuing Coreg to prevent rebound tachycardia.    COVID-19 treatment per primary team.   Currently, treatment will be driven by neurology and internal medicine.  We will see again Monday unless called over weekend.  Patient will eventually need to restart HF regimen, would use Coreg, Entresto, and spironolactone when stable to begin HF meds per neuro.   Loralie Champagne 07/20/2020 4:24 PM

## 2020-07-20 NOTE — Progress Notes (Signed)
ANTICOAGULATION CONSULT NOTE  Pharmacy Consult for Heparin Indication: LV thrombus with recent CVA  No Known Allergies  Patient Measurements: Height: 6\' 1"  (185.4 cm) Weight: 119 kg (262 lb 5.6 oz) IBW/kg (Calculated) : 79.9 Heparin Dosing Weight: 105.6 kg  Vital Signs: Temp: 98.4 F (36.9 C) (12/31 0847) Temp Source: Oral (12/31 0847) BP: 115/85 (12/31 1200) Pulse Rate: 93 (12/31 1231)  Labs: Recent Labs    07/19/20 1853 07/19/20 1907 07/19/20 1921 07/19/20 2107  HGB 14.1  --  15.3  --   HCT 44.2  --  45.0  --   PLT 242  --   --   --   APTT 33  --   --   --   LABPROT 14.5  --   --   --   INR 1.2  --   --   --   CREATININE 1.19  --   --   --   TROPONINIHS  --  45*  --  49*    Estimated Creatinine Clearance: 105.9 mL/min (by C-G formula based on SCr of 1.19 mg/dL).   Medical History: Past Medical History:  Diagnosis Date  . Diabetes mellitus without complication (HCC)   . Hemoglobin A1C greater than 9%, indicating poor diabetic control 12/2019  . Hyperglycemia 12/2019  . Hyperlipidemia 12/2019  . Hypertension   . Hypertensive urgency 12/2019  . Noncompliance with medication regimen 12/2019  . Urine ketones 12/2019  . Vitamin D deficiency 12/2019    Medications:  Scheduled:  . vitamin C  500 mg Oral Daily  . atorvastatin  80 mg Oral Daily  . carvedilol  12.5 mg Oral BID WC  . cholecalciferol  1,000 Units Oral Daily  . dexamethasone (DECADRON) injection  6 mg Intravenous Daily  . furosemide  40 mg Intravenous BID  . insulin aspart  0-5 Units Subcutaneous QHS  . insulin aspart  0-9 Units Subcutaneous TID WC  . insulin lispro  20 Units Subcutaneous Once  . zinc sulfate  220 mg Oral Daily    Assessment: Patient is a 44 yom that is admitted for a CVA. Patient was also found to have a LV thrombus on evaluation and is on no prior anticoagulation. Pharmacy has been asked to dose heparin at this time for the LV thrombus with a neuro protocol 2/2 CVA.   Goal  of Therapy:  Heparin level 0.3-0.5 units/ml Monitor platelets by anticoagulation protocol: Yes   Plan:  - No heparin Bolus 2/2 CVA - Heparin drip @ 1500 units/hr - Heparin level in ~ 6 hours  - Monitor patient for s/s of bleeding and CBC while on heparin   01/2020 PharmD. BCPS  07/20/2020,2:11 PM

## 2020-07-20 NOTE — ED Notes (Signed)
Patient CBG was 148. °

## 2020-07-20 NOTE — Progress Notes (Signed)
Pharmacy Antibiotic Note  Rodney Kane is a 44 y.o. male admitted on 07/19/2020 with fevers, possible sepsis.  Pharmacy has been consulted for Vancomycin and Zosyn  dosing.  Plan: Vancomycin 2000 mg IV now, then 1500 mg IV q12h Zosyn 3.375 g IV q8h      Temp (24hrs), Avg:100.1 F (37.8 C), Min:98.8 F (37.1 C), Max:102.2 F (39 C)  Recent Labs  Lab 07/16/20 1634 07/19/20 1853 07/19/20 2053 07/20/20 0416  WBC 4.8 6.5  --   --   CREATININE 1.02 1.19  --   --   LATICACIDVEN  --  2.2* 2.6* 3.4*    Estimated Creatinine Clearance: 105.9 mL/min (by C-G formula based on SCr of 1.19 mg/dL).    No Known Allergies   Rodney Kane 07/20/2020 6:26 AM

## 2020-07-21 DIAGNOSIS — I639 Cerebral infarction, unspecified: Secondary | ICD-10-CM | POA: Diagnosis not present

## 2020-07-21 DIAGNOSIS — U071 COVID-19: Secondary | ICD-10-CM | POA: Diagnosis not present

## 2020-07-21 LAB — CBC WITH DIFFERENTIAL/PLATELET
Abs Immature Granulocytes: 0.02 10*3/uL (ref 0.00–0.07)
Basophils Absolute: 0 10*3/uL (ref 0.0–0.1)
Basophils Relative: 0 %
Eosinophils Absolute: 0 10*3/uL (ref 0.0–0.5)
Eosinophils Relative: 0 %
HCT: 41.3 % (ref 39.0–52.0)
Hemoglobin: 13.5 g/dL (ref 13.0–17.0)
Immature Granulocytes: 0 %
Lymphocytes Relative: 31 %
Lymphs Abs: 1.7 10*3/uL (ref 0.7–4.0)
MCH: 25.3 pg — ABNORMAL LOW (ref 26.0–34.0)
MCHC: 32.7 g/dL (ref 30.0–36.0)
MCV: 77.3 fL — ABNORMAL LOW (ref 80.0–100.0)
Monocytes Absolute: 0.6 10*3/uL (ref 0.1–1.0)
Monocytes Relative: 12 %
Neutro Abs: 3 10*3/uL (ref 1.7–7.7)
Neutrophils Relative %: 57 %
Platelets: 263 10*3/uL (ref 150–400)
RBC: 5.34 MIL/uL (ref 4.22–5.81)
RDW: 13 % (ref 11.5–15.5)
WBC: 5.3 10*3/uL (ref 4.0–10.5)
nRBC: 0 % (ref 0.0–0.2)

## 2020-07-21 LAB — COMPREHENSIVE METABOLIC PANEL
ALT: 18 U/L (ref 0–44)
AST: 21 U/L (ref 15–41)
Albumin: 3 g/dL — ABNORMAL LOW (ref 3.5–5.0)
Alkaline Phosphatase: 36 U/L — ABNORMAL LOW (ref 38–126)
Anion gap: 12 (ref 5–15)
BUN: 22 mg/dL — ABNORMAL HIGH (ref 6–20)
CO2: 25 mmol/L (ref 22–32)
Calcium: 8.3 mg/dL — ABNORMAL LOW (ref 8.9–10.3)
Chloride: 97 mmol/L — ABNORMAL LOW (ref 98–111)
Creatinine, Ser: 1.15 mg/dL (ref 0.61–1.24)
GFR, Estimated: >60 mL/min (ref >60)
Glucose, Bld: 186 mg/dL — ABNORMAL HIGH (ref 70–99)
Potassium: 3.1 mmol/L — ABNORMAL LOW (ref 3.5–5.1)
Sodium: 134 mmol/L — ABNORMAL LOW (ref 135–145)
Total Bilirubin: 0.9 mg/dL (ref 0.3–1.2)
Total Protein: 6.5 g/dL (ref 6.5–8.1)

## 2020-07-21 LAB — GLUCOSE, CAPILLARY
Glucose-Capillary: 165 mg/dL — ABNORMAL HIGH (ref 70–99)
Glucose-Capillary: 242 mg/dL — ABNORMAL HIGH (ref 70–99)
Glucose-Capillary: 240 mg/dL — ABNORMAL HIGH (ref 70–99)
Glucose-Capillary: 241 mg/dL — ABNORMAL HIGH (ref 70–99)

## 2020-07-21 LAB — HEPARIN LEVEL (UNFRACTIONATED): Heparin Unfractionated: 0.67 IU/mL (ref 0.30–0.70)

## 2020-07-21 LAB — C-REACTIVE PROTEIN: CRP: 12.5 mg/dL — ABNORMAL HIGH (ref <1.0)

## 2020-07-21 LAB — D-DIMER, QUANTITATIVE: D-Dimer, Quant: 1.43 ug/mL-FEU — ABNORMAL HIGH (ref 0.00–0.50)

## 2020-07-21 MED ORDER — POTASSIUM CHLORIDE CRYS ER 20 MEQ PO TBCR
40.0000 meq | EXTENDED_RELEASE_TABLET | Freq: Once | ORAL | Status: AC
  Administered 2020-07-21: 40 meq via ORAL
  Filled 2020-07-21: qty 2

## 2020-07-21 MED ORDER — POTASSIUM CHLORIDE CRYS ER 20 MEQ PO TBCR
40.0000 meq | EXTENDED_RELEASE_TABLET | Freq: Four times a day (QID) | ORAL | Status: AC
Start: 2020-07-21 — End: 2020-07-21
  Administered 2020-07-21 (×2): 40 meq via ORAL
  Filled 2020-07-21 (×2): qty 2

## 2020-07-21 NOTE — Progress Notes (Signed)
ANTICOAGULATION CONSULT NOTE  Pharmacy Consult for Heparin Indication: LV thrombus with recent CVA  No Known Allergies  Patient Measurements: Height: 6\' 1"  (185.4 cm) Weight: 120.5 kg (265 lb 10.5 oz) IBW/kg (Calculated) : 79.9 Heparin Dosing Weight: 105.6 kg  Vital Signs: Temp: 97.7 F (36.5 C) (01/01 0531) Temp Source: Oral (01/01 0531) BP: 152/75 (01/01 0531) Pulse Rate: 74 (01/01 0531)  Labs: Recent Labs    07/19/20 1853 07/19/20 1907 07/19/20 1921 07/19/20 2107 07/20/20 2223 07/21/20 0512  HGB 14.1  --  15.3  --   --  13.5  HCT 44.2  --  45.0  --   --  41.3  PLT 242  --   --   --   --  263  APTT 33  --   --   --   --   --   LABPROT 14.5  --   --   --   --   --   INR 1.2  --   --   --   --   --   HEPARINUNFRC  --   --   --   --  0.61 0.67  CREATININE 1.19  --   --   --   --  1.15  TROPONINIHS  --  45*  --  49*  --   --     Estimated Creatinine Clearance: 110.3 mL/min (by C-G formula based on SCr of 1.15 mg/dL).   Assessment: Patient is a 58 yom that is admitted for a CVA. Patient was also found to have a LV thrombus on evaluation and is on no prior anticoagulation. Pharmacy has been asked to dose heparin at this time for the LV thrombus with a neuro protocol 2/2 CVA.  HL this AM was above goal at 0.67 after a dose reduction on 12/31.   Goal of Therapy:  Heparin level 0.3-0.5 units/ml Monitor platelets by anticoagulation protocol: Yes   Plan:  - Decrease heparin to 1150units/hour  - Re-check heparin level in 8 hours  - Monitor for s/sx of bleed  - HL and CBC daily   1/32, PharmD, [facility] Pharmacy Resident [phone] 07/21/2020 7:44 AM

## 2020-07-21 NOTE — Progress Notes (Signed)
STROKE TEAM PROGRESS NOTE   INTERVAL HISTORY No new events/complaints. This morning heparin level was 0.67H and drip decreased.  Vitals:   07/21/20 0108 07/21/20 0352 07/21/20 0531 07/21/20 0746  BP:  120/90 (!) 152/75 (!) 124/91  Pulse:  88 74 92  Resp:  18 16 15   Temp:  98.3 F (36.8 C) 97.7 F (36.5 C) 97.6 F (36.4 C)  TempSrc:  Oral Oral Oral  SpO2:  95% 100% 97%  Weight: 120.5 kg 120.5 kg    Height: 6\' 1"  (1.854 m)      CBC:  Recent Labs  Lab 07/19/20 1853 07/19/20 1921 07/21/20 0512  WBC 6.5  --  5.3  NEUTROABS 5.0  --  3.0  HGB 14.1 15.3 13.5  HCT 44.2 45.0 41.3  MCV 79.2*  --  77.3*  PLT 242  --  263   Basic Metabolic Panel:  Recent Labs  Lab 07/19/20 1853 07/19/20 1921 07/21/20 0512  NA 130* 133* 134*  K 3.8 3.9 3.1*  CL 94*  --  97*  CO2 21*  --  25  GLUCOSE 182*  --  186*  BUN 15  --  22*  CREATININE 1.19  --  1.15  CALCIUM 9.0  --  8.3*  MG 1.8  --   --    Lipid Panel:  Recent Labs  Lab 07/20/20 0416  CHOL 145  TRIG 62  HDL 31*  CHOLHDL 4.7  VLDL 12  LDLCALC 09/18/20*   HgbA1c:  Recent Labs  Lab 07/20/20 0416  HGBA1C 7.7*   Urine Drug Screen:  Recent Labs  Lab 07/20/20 2016  LABOPIA NONE DETECTED  COCAINSCRNUR NONE DETECTED  LABBENZ NONE DETECTED  AMPHETMU NONE DETECTED  THCU NONE DETECTED  LABBARB NONE DETECTED    Alcohol Level  Recent Labs  Lab 07/19/20 1856  ETH <10    IMAGING past 24 hours MR Brain Wo Contrast (neuro protocol)  Result Date: 07/20/2020 CLINICAL DATA:  45 year old male with stroke-like symptoms for 4 days. 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 COVID-19. EXAM: MRI HEAD WITHOUT CONTRAST TECHNIQUE: Multiplanar, multiecho pulse sequences of the brain and surrounding structures were obtained without intravenous contrast. COMPARISON:  CT head, CTA head and neck yesterday. FINDINGS: Brain: Confluent restricted diffusion from the posterior temporal lobe  through the lateral left occipital lobe tracking to the left occipital pole, an area of 9.5 cm (series 2, image 29) corresponding to the CT finding yesterday. Multifocal petechial hemorrhage (series 7, image 61) and confluent cytotoxic edema but no malignant hemorrhagic transformation and only mild mass effect on the left lateral ventricle without midline shift. Superimposed small cortical restricted diffusion at the right superior parietal lobe (series 2, image 44). Mild if any associated T2 and FLAIR hyperintensity. No hemorrhage. No other restricted diffusion. No midline shift, evidence of mass lesion, ventriculomegaly, extra-axial collection. Patchy and nodular superimposed bilateral cerebral white matter T2 and FLAIR hyperintensity in a nonspecific configuration. The corpus callosum and temporal lobes are spared. No chronic cortical encephalomalacia. But there do appear to be several chronic microhemorrhages in the left lentiform nuclei (series 7, images 52 and 56). No other chronic cerebral blood products. Elsewhere the deep gray nuclei, brainstem and cerebellum are within normal limits. Cervicomedullary junction and pituitary are within normal limits. Vascular: Major intracranial vascular flow voids are preserved. Skull and upper cervical spine: Negative. Sinuses/Orbits: Disconjugate gaze otherwise negative orbits. Paranasal sinuses and mastoids are stable and well  pneumatized. Other: None. IMPRESSION: 1. Large acute infarct in the posterior left hemisphere with petechial hemorrhage and cytotoxic edema, but no malignant hemorrhagic transformation or significant intracranial mass effect. 2. Solitary small acute cortical infarct superimposed in the superior right parietal lobe. 3. Chronic micro-hemorrhages in the left lentiform nuclei. Bilateral periventricular white matter signal changes which may therefore be chronic small vessel disease related. Electronically Signed   By: Odessa Fleming M.D.   On: 07/20/2020 12:00    ECHOCARDIOGRAM COMPLETE  Result Date: 07/20/2020    ECHOCARDIOGRAM REPORT   Patient Name:   GYAN CAMBRE Date of Exam: 07/20/2020 Medical Rec #:  [id number]     Height:       73.0 in Accession #:    [id number]    Weight:       262.3 lb Date of Birth:  1976-05-01     BSA:          2.415 m Patient Age:    44 years      BP:           153/106 mmHg Patient Gender: M             HR:           108 bpm. Exam Location:  Inpatient Procedure: 2D Echo, Color Doppler, Cardiac Doppler and Intracardiac            Opacification Agent Indications:    CHF-Acute Systolic I50.21  History:        Patient has prior history of Echocardiogram examinations, most                 recent 01/04/2017. Risk Factors:Hypertension, Dyslipidemia and                 Diabetes.  Sonographer:    Eulah Pont RDCS Referring Phys: [id number] VASUNDHRA RATHORE IMPRESSIONS  1. A small (8 mm diameter), almost spherical, slightly mobile ventricular apical thrombus is seen. Left ventricular ejection fraction, by estimation, is 20 to 25%. The left ventricle has severely decreased function. The left ventricle demonstrates global hypokinesis. The left ventricular internal cavity size was moderately dilated. Indeterminate diastolic filling due to E-A fusion.  2. Right ventricular systolic function is mildly reduced. The right ventricular size is normal. Tricuspid regurgitation signal is inadequate for assessing PA pressure.  3. Left atrial size was moderately dilated.  4. A small pericardial effusion is present. The pericardial effusion is localized near the right atrium.  5. The mitral valve is normal in structure. No evidence of mitral valve regurgitation. No evidence of mitral stenosis.  6. The aortic valve is tricuspid. Aortic valve regurgitation is not visualized. No aortic stenosis is present.  7. The inferior vena cava is normal in size with greater than 50% respiratory variability, suggesting right atrial pressure of 3 mmHg. Comparison(s): Prior images  unable to be directly viewed, comparison made by report only. The left ventricular function is worsened. A new apical LV thrombus is seen. FINDINGS  Left Ventricle: A small (8 mm diameter), almost spherical, slightly mobile ventricular apical thrombus is seen. Left ventricular ejection fraction, by estimation, is 20 to 25%. The left ventricle has severely decreased function. The left ventricle demonstrates global hypokinesis. Definity contrast agent was given IV to delineate the left ventricular endocardial borders. The left ventricular internal cavity size was moderately dilated. There is no left ventricular hypertrophy. Indeterminate diastolic filling due to E-A fusion. Right Ventricle: The right ventricular size is normal. No increase in right ventricular wall  thickness. Right ventricular systolic function is mildly reduced. Tricuspid regurgitation signal is inadequate for assessing PA pressure. Left Atrium: Left atrial size was moderately dilated. Right Atrium: Right atrial size was normal in size. Pericardium: A small pericardial effusion is present. The pericardial effusion is localized near the right atrium. Mitral Valve: The mitral valve is normal in structure. No evidence of mitral valve regurgitation. No evidence of mitral valve stenosis. Tricuspid Valve: The tricuspid valve is normal in structure. Tricuspid valve regurgitation is not demonstrated. Aortic Valve: The aortic valve is tricuspid. Aortic valve regurgitation is not visualized. No aortic stenosis is present. Pulmonic Valve: The pulmonic valve was grossly normal. Pulmonic valve regurgitation is not visualized. Aorta: The aortic root and ascending aorta are structurally normal, with no evidence of dilitation. Venous: The inferior vena cava is normal in size with greater than 50% respiratory variability, suggesting right atrial pressure of 3 mmHg. IAS/Shunts: No atrial level shunt detected by color flow Doppler.  LEFT VENTRICLE PLAX 2D LVIDd:          6.60 cm LVIDs:         5.40 cm LV PW:         1.10 cm LV IVS:        0.90 cm LVOT diam:     2.10 cm LV SV:         37 LV SV Index:   16 LVOT Area:     3.46 cm  RIGHT VENTRICLE RV S prime:     7.89 cm/s TAPSE (M-mode): 2.0 cm LEFT ATRIUM              Index       RIGHT ATRIUM           Index LA diam:        4.20 cm  1.74 cm/m  RA Area:     17.50 cm LA Vol (A2C):   98.6 ml  40.83 ml/m RA Volume:   46.80 ml  19.38 ml/m LA Vol (A4C):   101.0 ml 41.83 ml/m LA Biplane Vol: 103.0 ml 42.66 ml/m  AORTIC VALVE LVOT Vmax:   76.50 cm/s LVOT Vmean:  54.750 cm/s LVOT VTI:    0.108 m  AORTA Ao Root diam: 3.60 cm Ao Asc diam:  3.10 cm  SHUNTS Systemic VTI:  0.11 m Systemic Diam: 2.10 cm Mihai Croitoru MD Electronically signed by Sanda Klein MD Signature Date/Time: 07/20/2020/1:15:21 PM    Final     PHYSICAL EXAM  Temp:  [97.6 F (36.4 C)-98.3 F (36.8 C)] 97.6 F (36.4 C) (01/01 0746) Pulse Rate:  [74-95] 92 (01/01 0746) Resp:  [11-26] 15 (01/01 0746) BP: (92-152)/(58-91) 124/91 (01/01 0746) SpO2:  [93 %-100 %] 97 % (01/01 0746) Weight:  [119 kg-120.5 kg] 120.5 kg (01/01 0352)  General - Well nourished, well developed, in no apparent distress.  Ophthalmologic - fundi not visualized due to noncooperation.  Cardiovascular - Regular rhythm and rate.  Neuro - awake alert, eyes open, receptive aphasia > expressive aphasia.  Able to have limited language output, however not able to repeat or name.  Intermittently follow some simple commands, but not all the commands. PERRL, EOMI, tracking bilaterally.  Inconsistently blinking to visual threat bilaterally.  No significant facial droop.  Tongue protrusion midline.  Bilateral upper and lower extremity symmetrical strength. FTN intact bilaterally with prompt.  Sensation not corporative.  Gait not tested   ASSESSMENT/PLAN Mr. Dquan Cortopassi is a 45 y.o. male with history of diabetes, hypertension, hyperlipidemia,  noncompliance to medications, nonischemic  cardiomyopathy presenting with confusion and altered mental status w/ 3d hx SOB, CP. Now globally aphasic w/ positive COVID (which was neg 3 days prior) found to have LV thrombus.  Stroke:  Large L MCA and punctate R frontoparietal infarcts, cardioembolic secondary to apical thrombus in the setting of chronic cardiomyopathy and COVID PNA  CT head L posterior parietal hypodensity.   CTA head no LVO. L parietal hypodensity  CTA neck Unremarkable   MRI  Large L MCA (posterior temporal to L occipital) infarct w/ petechial hemorrhagic transformation and cytotoxic edema.  Punctate R parietal cortical infarct. Chronic L lentiform nucleus microhemorrhages. B periventricular white matter disease.  2D Echo apical thrombus. EF 20-25%.   LDL 102  HgbA1c 7.7  VTE prophylaxis - Heparin IV per stroke protocol  aspirin 81 mg daily prior to admission, now on  heparin IV per stroke protocol  Therapy recommendations:  pending   Disposition:  pending   Chronic cardiomyopathy LV thrombus  12/2016 EF 30 to 35%  This admission EF 20-25%  LV apical thrombus found on TTE  Now on heparin IV per stroke protocol - Heparin level this morning 0.67 above goal and decreased to 1150 units/hour.   Cardiology on board - consult Dr Shirlee Latch 12/31 - Heparin transition will need to transition to coumadin (not DOAC) for thrombus - HF meds (Coreg ; Entresto; Spironolactone) when stable from neurology standpoint.  Severe Sepsis secondary to COVID-19 PNA  Positive test on admission (neg 3 days prior)  TM 102.2  CXR w/ pulmonary vascular congestion  D-dimer 1.27  Fibrinogen 608  On remdesivir, steroids   Zosyn (off) >> Vancomycin ; Rocephin ; Doxycycline Started 07/20/20  Hypertension  Stable on the low side . avoid low BP . Long-term BP goal normotensive  Hyperlipidemia  Home meds:  lipitor 10  Now on lipitor 80  LDL 102, goal < 70  Continue statin at discharge  Diabetes type II  Uncontrolled  HgbA1c 7.7, goal < 7.0  CBGs  SSI  Close PCP follow-up for better DM control  Other Stroke Risk Factors  Obesity, Body mass index is 35.05 kg/m., BMI >/= 30 associated with increased stroke risk, recommend weight loss, diet and exercise as appropriate   NICM w/ acute on chronic combined systolic and diastolic Congestive heart failure  Other Active Problems  Sinus tachycardia  Troponin elevation - cardiology on board  Mild hyponatremia Na 133  QT prolongation  Hospital day # 2   To contact Stroke Continuity provider, please refer to [email or web address]. After hours, contact General Neurology

## 2020-07-21 NOTE — Evaluation (Signed)
Physical Therapy Evaluation Patient Details Name: Rodney Kane MRN: [id number] DOB: 07-26-1975 Today's Date: 07/21/2020   History of Present Illness  45 y.o. 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 CT head showing acute/subacute infarct in the left posterior parietal lobe. 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 Pt lives with his 38 yo son in an apartment on the ground floor with no steps to enter. PLOF and home set up collected via phone conversation with his daughter. Pt is currently limited in safe mobility by R sided weakness/impaired sensation/coordination and neglect, which contributes to decreased balance and safety. Pt's cognition is difficult to ascertain as he also has expressive and receptive aphasia. Pt is aware that he has deficits and is frustrated when he can not understand or express himself. Pt is currently min guard for bed mobility and light minAx1-2 for transfers and ambulation without AD. PT currently recommending 24 hour assistance, and Outpatient Neuro PT. PT will continue to follow acutely.     Follow Up Recommendations Outpatient PT;Supervision/Assistance - 24 hour    Equipment Recommendations  None recommended by PT    Recommendations for Other Services Speech consult     Precautions / Restrictions Precautions Precautions: None Restrictions Weight Bearing Restrictions: No      Mobility  Bed Mobility Overal bed mobility: Needs Assistance Bed Mobility:  Supine to Sit     Supine to sit: HOB elevated;Min guard     General bed mobility comments: min guard for safety, increased time and effort    Transfers Overall transfer level: Needs assistance Equipment used: None Transfers: Sit to/from Stand Sit to Stand: Min assist;+2 physical assistance         General transfer comment: good power up, min Ax2 for steadying, R lateral lean  Ambulation/Gait Ambulation/Gait assistance: Min assist Gait Distance (Feet): 200 Feet Assistive device: None Gait Pattern/deviations: Step-through pattern (R lateral lean) Gait velocity: slowed Gait velocity interpretation: <1.8 ft/sec, indicate of risk for recurrent falls General Gait Details: contact guard assist for safety, pt with increased R lateral lean, and slight inattention, however able to navigate obstacles on R, difficult to turn to the L     Modified Rankin (Stroke Patients Only) Modified Rankin (Stroke Patients Only) Pre-Morbid Rankin Score: No symptoms Modified Rankin: Moderate disability     Balance Overall balance assessment: Needs assistance Sitting-balance support: Feet supported;No upper extremity supported Sitting balance-Leahy Scale: Fair     Standing balance support: During functional activity;No upper extremity supported Standing balance-Leahy Scale: Fair                               Pertinent Vitals/Pain Pain Assessment: Faces Faces Pain Scale: No hurt    Home Living Family/patient expects to be discharged to:: Private residence Living Arrangements: Children (26 year old son) Available Help at Discharge: Family;Available PRN/intermittently Type of Home: Apartment Home Access: Level entry     Home Layout: One level Home Equipment: None Additional Comments: daughter provided home set up and PLOF    Prior Function Level of Independence: Independent  Comments: works at [employer] and [employer]   Dominant Hand:  Left    Extremity/Trunk Assessment   Upper Extremity Assessment Upper Extremity Assessment: Defer to OT evaluation    Lower Extremity Assessment Lower Extremity Assessment: RLE deficits/detail;LLE deficits/detail;Difficult to assess due to impaired cognition RLE Deficits / Details: ROM WFL, strength 4-/5 RLE Sensation: decreased light touch RLE Coordination: decreased fine motor LLE Deficits / Details: ROM WFL, strength 4/5 LLE Coordination: decreased fine motor    Cervical / Trunk Assessment Cervical / Trunk Assessment: Other exceptions (R lateral lean)  Communication   Communication: Expressive difficulties;Receptive difficulties (aphasia)  Cognition Arousal/Alertness: Awake/alert Behavior During Therapy: WFL for tasks assessed/performed Overall Cognitive Status: Impaired/Different from baseline Area of Impairment: Following commands;Problem solving;Memory                   Current Attention Level: Selective Memory: Decreased short-term memory Following Commands: Follows one step commands with increased time;Follows multi-step commands with increased time     Problem Solving: Requires verbal cues;Requires tactile cues;Slow processing General Comments: Difficult to determine level of cognition, versus aphasia, sometimes able to answer question with rephrase, pt aware he has deficits      General Comments General comments (skin integrity, edema, etc.): Pt with slight R sided neglect and slightly skewed midline leaning towards R        Assessment/Plan    PT Assessment Patient needs continued PT services  PT Problem List Decreased balance;Cardiopulmonary status limiting activity;Decreased cognition;Decreased coordination;Decreased safety awareness;Impaired sensation       PT Treatment Interventions DME instruction;Gait training;Functional mobility training;Therapeutic activities;Balance training;Therapeutic exercise;Cognitive remediation;Neuromuscular  re-education;Patient/family education    PT Goals (Current goals can be found in the Care Plan section)  Acute Rehab PT Goals Patient Stated Goal: watch [employer] play PT Goal Formulation: With patient Time For Goal Achievement: 08/04/20 Potential to Achieve Goals: Good    Frequency Min 4X/week    AM-PAC PT "6 Clicks" Mobility  Outcome Measure Help needed turning from your back to your side while in a flat bed without using bedrails?: None Help needed moving from lying on your back to sitting on the side of a flat bed without using bedrails?: None Help needed moving to and from a bed to a chair (including a wheelchair)?: A Little Help needed standing up from a chair using your arms (e.g., wheelchair or bedside chair)?: A Little Help needed to walk in hospital room?: A Little Help needed climbing 3-5 steps with a railing? : A Little 6 Click Score: 20    End of Session   Activity Tolerance: Patient tolerated treatment well Patient left: in chair;with call bell/phone within reach;with chair alarm set Nurse Communication: Mobility status;Other (comment) (aphasia (restate questions, and have patience)) PT Visit Diagnosis: Other abnormalities of gait and mobility (R26.89);Difficulty in walking, not elsewhere classified (R26.2);Hemiplegia and hemiparesis Hemiplegia - dominant/non-dominant: Non-dominant Hemiplegia - caused by: Cerebral infarction    Time: 0920-1016 PT Time Calculation (min) (ACUTE ONLY): 56 min   Charges:   PT Evaluation $PT Eval Moderate Complexity: 1 Mod PT Treatments $Gait Training: 8-22 mins        Rodney Kane PT, DPT Acute Rehabilitation Services Pager [phone] Office [phone]   Rodney Kane Fleet 07/21/2020, 12:54 PM

## 2020-07-21 NOTE — Progress Notes (Addendum)
PROGRESS NOTE                                                                             PROGRESS NOTE                                                                                                                                                                                                             Patient Demographics:    Rodney Kane, is a 45 y.o. male, DOB - [birth year], ZOX:[id number]  Outpatient Primary MD for the patient is Kallie Locks, FNP    LOS - 2  Admit date - 07/19/2020    Chief Complaint  Patient presents with  . Altered Mental Status       Brief Narrative     HPI: Rodney Kane is a 45 y.o. 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  Family reported to EMS that patient has had difficulty speaking for the last several days.  Unable to obtain any history from patient at this time due to his expressive aphasia.  No family bedside. Of note, patient was seen in the ED several days ago for chest pain and shortness of breath.  He had an unremarkable work-up at that time including CT of his chest showing no PE.  ED Course: Febrile with temperature 102.2 F.  Tachycardic with heart rate in the 140s.  Not hypotensive.  Not tachypneic or hypoxic.  SARS-CoV-2 PCR test positive.  Influenza panel negative.  WBC 6.5, hemoglobin 14.1, hematocrit 44.2, platelet count 242K.  Sodium 130, potassium 3.8, chloride 94, bicarb 21, BUN 15, creatinine 1.1, glucose 182.  LFTs normal.  Lipase normal.  Lactic acid 2.2 >2.6.  INR 1.2.  TSH normal.  Free T4 borderline elevated at 1.26.  Magnesium within normal range.  RPR pending.  Ammonia level <9.  Blood ethanol level undetectable.  UA  and urine culture pending.  UDS pending.  Blood culture x2 pending.  Initial high-sensitivity troponin 45, repeat pending.  BNP significantly elevated at 1619.  VBG with pH 7.48.  Triglycerides within normal range.  Ferritin, fibrinogen, LDH, CRP, D-dimer, procalcitonin pending.  CT head showing acute/subacute infarct in the left posterior parietal lobe.  Mild mass-effect without midline shift.  No acute intracranial hemorrhage.  ED provider discussed the case with Dr. Wilford Corner from neurology who recommended admission for stroke work-up with MRI and CTA of head and neck. CTA head and neck negative for LVO.  Brain MRI pending.  Chest x-ray showing cardiomegaly and pulmonary vascular congestion.  Given persistent tachycardia there is question for a flutter versus A. fib.  However, ED provider spoke to Dr. Mayford Knife who reviewed the patient's EKG and felt that it was showing sinus tachycardia.  Patient received Tylenol, ceftriaxone, and 1 L LR bolus initially for sepsis.  He was later given a dose of IV Lasix 40 mg for volume overload.    Subjective:    [name] today remains with expressive and receptive aphasia, no significant events as discussed with staff overnight, patient himself denies any complaints .   Assessment  & Plan :    Principal Problem:   COVID-19 virus infection Active Problems:   CHF exacerbation (HCC)   Severe sepsis (HCC)   Sinus tachycardia   Acute CVA (cerebrovascular accident) (HCC)     SpO2: 97 %  Recent Labs  Lab 07/16/20 1634 07/16/20 2229 07/19/20 1831 07/19/20 1853 07/19/20 1907 07/19/20 2053 07/19/20 2107 07/20/20 0416 07/21/20 0512  WBC 4.8  --   --  6.5  --   --   --   --  5.3  PLT 281  --   --  242  --   --   --   --  263  CRP  --   --   --   --   --   --  12.4*  --  12.5*  BNP  --   --   --   --  5,056.9*  --   --   --   --   DDIMER  --  0.58*  --   --   --   --  1.27*  --  1.43*  PROCALCITON  --   --   --   --   --   --  0.68  --   --    AST  --   --   --  22  --   --   --   --  21  ALT  --   --   --  21  --   --   --   --  18  ALKPHOS  --   --   --  48  --   --   --   --  36*  BILITOT  --   --   --  1.0  --   --   --   --  0.9  ALBUMIN  --   --   --  3.6  --   --   --   --  3.0*  INR  --   --   --  1.2  --   --   --   --   --   LATICACIDVEN  --   --   --  2.2*  --  2.6*  --  3.4*  --  SARSCOV2NAA  --  NEGATIVE POSITIVE*  --   --   --   --   --   --        ABG     Component Value Date/Time   PHART 7.458 (H) 01/05/2017 0817   PCO2ART 39.9 01/05/2017 0817   PO2ART 70.0 (L) 01/05/2017 0817   HCO3 25.6 07/19/2020 1921   TCO2 27 07/19/2020 1921   O2SAT 54.0 07/19/2020 1921     Acute CVA:  - Patient with  expressive aphasia for several days and staggering gait. -MRI brain cannot for large acute infarct in the posterior left hemisphere with petechial hemorrhage and cytotoxic edema, but no malignant hemorrhagic transformation or significant intracranial mass effect, solitary small acute cortical infarcts superimposed in the superior right parietal lobe, and chronic microhemorrhages . -  CT head showing acute/subacute infarct in the left posterior parietal lobe.  Mild mass-effect without midline shift.  No acute intracranial hemorrhage.  CTA head and neck negative for LVO. -Significant for low EF 20 to 25%, and apical thrombus, most likely the cause of his acute CVA, started on heparin drip. -Hemoglobin A1c 7.7, LDL 102 -Atorvastatin 80 mg daily -PT, OT, speech therapy    COVID-19 viral infection: -Patient is unvaccinated. -No pneumonia on imaging, no hypoxia.he is very likely early in his COVID 19 infection to have any symptoms's. -Will be started empirically on remdesivir and steroids at this point. -Continue to trend inflammatory markers -Procalcitonin of 0.68 will narrow from vancomycin and Zosyn to Rocephin and doxycycline.   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 -Repeat 2D echo this admission with low EF 20 to 25%, and apical thrombus, low EF and thrombus most likely in the setting of known chronic CHF, unlikely Covid at this point contributing given patient is still early in his Covid disease, with no evidence of pneumonia or significant coagulopathy yet, so low EF most likely progression of his known nonischemic cardiomyopathy with noncompliance with medications, and apical thrombus are related to that and not due to Covid. -Cardiology input greatly appreciated, on Coreg, will need Entresto, spironolactone when more stable , on heparin GTT, will need to transition to warfarin for thrombus once stable .  Mild troponin elevation:  -Most likely in the setting of acute CVA, and atrial thrombus .  Mild hyponatremia:  -L solved with hydration  Hypertension -Resumed home Coreg given persistent tachycardia.  Hold any other antihypertensives at this time to allow permissive hypertension in the setting of stroke which is possibly acute.  Hyperlipidemia -Continue statin, dose increased to full intensity statin given stroke  Hypokalemia - repleted  Insulin-dependent type 2 diabetes:  - No recent A1c results available for review.  Per med rec, patient is noncompliant with home meds.  He has not taken any medications for the past month. -Check A1c.  Sliding-scale insulin sensitive ACHS.   QT prolongation on EKG -Cardiac monitoring -Keep potassium above 4 and magnesium above 2 -Repeat EKG in a.m. -Avoid QT prolonging drugs if possible.  Medications noncompliance -Will be counseled  Condition - Extremely Guarded  Family Communication  : D/W daughter by phone 07/21/2020  Code Status :  Full  Consults  :  Neurology, cardiology  Procedures  :  none  Disposition Plan  :    Status is: Inpatient  Remains inpatient appropriate because:IV treatments appropriate due to intensity of  illness or inability to take PO  Dispo: The patient is from: Home              Anticipated d/c is to: SNF              Anticipated d/c date is: > 3 days              Patient currently is not medically stable to d/c.      DVT Prophylaxis  :  Heparin GTT  Lab Results  Component Value Date   PLT 263 07/21/2020    Diet :  Diet Order            Diet Carb Modified Fluid consistency: Thin; Room service appropriate? Yes  Diet effective now                  Inpatient Medications  Scheduled Meds: . vitamin C  500 mg Oral Daily  . atorvastatin  80 mg Oral Daily  . carvedilol  12.5 mg Oral BID WC  . cholecalciferol  1,000 Units Oral Daily  . dexamethasone (DECADRON) injection  6 mg Intravenous Daily  . doxycycline  100 mg Oral Q12H  . insulin aspart  0-5 Units Subcutaneous QHS  . insulin aspart  0-9 Units Subcutaneous TID WC  . potassium chloride  40 mEq Oral Q6H  . zinc sulfate  220 mg Oral Daily   Continuous Infusions: . cefTRIAXone (ROCEPHIN)  IV Stopped (07/20/20 1624)  . heparin 1,150 Units/hr (07/21/20 0750)  . remdesivir 100 mg in NS 100 mL Stopped (07/21/20 0846)   PRN Meds:.acetaminophen  Antibiotics  :    Anti-infectives (From admission, onward)   Start     Dose/Rate Route Frequency Ordered Stop   07/21/20 1000  remdesivir 100 mg in sodium chloride 0.9 % 100 mL IVPB       "Followed by" Linked Group Details   100 mg 200 mL/hr over 30 Minutes Intravenous Daily 07/20/20 0055 07/25/20 0959   07/20/20 1800  vancomycin (VANCOREADY) IVPB 1500 mg/300 mL  Status:  Discontinued        1,500 mg 150 mL/hr over 120 Minutes Intravenous Every 12 hours 07/20/20 0628 07/20/20 1425   07/20/20 1500  cefTRIAXone (ROCEPHIN) 1 g in sodium chloride 0.9 % 100 mL IVPB        1 g 200 mL/hr over 30 Minutes Intravenous Every 24 hours 07/20/20 1425     07/20/20 1430  doxycycline (VIBRA-TABS) tablet 100 mg        100 mg Oral Every 12 hours 07/20/20 1425     07/20/20 0700   vancomycin (VANCOREADY) IVPB 2000 mg/400 mL        2,000 mg 200 mL/hr over 120 Minutes Intravenous  Once 07/20/20 0628 07/20/20 1355   07/20/20 0630  piperacillin-tazobactam (ZOSYN) IVPB 3.375 g  Status:  Discontinued        3.375 g 12.5 mL/hr over 240 Minutes Intravenous Every 8 hours 07/20/20 0628 07/20/20 1425   07/20/20 0200  remdesivir 200 mg in sodium chloride 0.9% 250 mL IVPB       "Followed by" Linked Group Details   200 mg 580 mL/hr over 30 Minutes Intravenous Once 07/20/20 0055 07/20/20 0238   07/19/20 1900  cefTRIAXone (ROCEPHIN) 2 g in sodium chloride 0.9 % 100 mL IVPB        2 g 200 mL/hr over 30 Minutes Intravenous  Once 07/19/20 1856 07/19/20 2040        Vernal Hritz M.D on 07/21/2020 at 1:58 PM  To  page go to [email or web address]   [facility] -  Office  [phone]      Objective:   Vitals:   07/21/20 0352 07/21/20 0531 07/21/20 0746 07/21/20 1229  BP: 120/90 (!) 152/75 (!) 124/91 119/90  Pulse: 88 74 92   Resp: 18 16 15    Temp: 98.3 F (36.8 C) 97.7 F (36.5 C) 97.6 F (36.4 C) 98.5 F (36.9 C)  TempSrc: Oral Oral Oral Oral  SpO2: 95% 100% 97%   Weight: 120.5 kg     Height:        Wt Readings from Last 3 Encounters:  07/21/20 120.5 kg  07/16/20 119 kg  02/23/20 119.7 kg     Intake/Output Summary (Last 24 hours) at 07/21/2020 1358 Last data filed at 07/21/2020 0945 Gross per 24 hour  Intake 340 ml  Output -  Net 340 ml     Physical Exam  Awake Alert, patient is with significant expressive and receptive aphasia Symmetrical Chest wall movement, Good air movement bilaterally, CTAB RRR,No Gallops,Rubs or new Murmurs, No Parasternal Heave +ve B.Sounds, Abd Soft, No tenderness, No rebound - guarding or rigidity. No Cyanosis, Clubbing or edema, No new Rash or bruise       Data Review:    CBC Recent Labs  Lab 07/16/20 1634 07/19/20 1853 07/19/20 1921 07/21/20 0512  WBC 4.8 6.5  --  5.3  HGB 13.2 14.1 15.3 13.5  HCT 41.2 44.2  45.0 41.3  PLT 281 242  --  263  MCV 81.7 79.2*  --  77.3*  MCH 26.2 25.3*  --  25.3*  MCHC 32.0 31.9  --  32.7  RDW 13.4 12.8  --  13.0  LYMPHSABS  --  0.8  --  1.7  MONOABS  --  0.7  --  0.6  EOSABS  --  0.0  --  0.0  BASOSABS  --  0.0  --  0.0    Recent Labs  Lab 07/16/20 1634 07/16/20 2229 07/19/20 1853 07/19/20 1855 07/19/20 1856 07/19/20 1907 07/19/20 1921 07/19/20 2053 07/19/20 2107 07/20/20 0416 07/21/20 0512  NA 141  --  130*  --   --   --  133*  --   --   --  134*  K 4.0  --  3.8  --   --   --  3.9  --   --   --  3.1*  CL 105  --  94*  --   --   --   --   --   --   --  97*  CO2 27  --  21*  --   --   --   --   --   --   --  25  GLUCOSE 141*  --  182*  --   --   --   --   --   --   --  186*  BUN 14  --  15  --   --   --   --   --   --   --  22*  CREATININE 1.02  --  1.19  --   --   --   --   --   --   --  1.15  CALCIUM 9.1  --  9.0  --   --   --   --   --   --   --  8.3*  AST  --   --  22  --   --   --   --   --   --   --  21  ALT  --   --  21  --   --   --   --   --   --   --  18  ALKPHOS  --   --  48  --   --   --   --   --   --   --  36*  BILITOT  --   --  1.0  --   --   --   --   --   --   --  0.9  ALBUMIN  --   --  3.6  --   --   --   --   --   --   --  3.0*  MG  --   --  1.8  --   --   --   --   --   --   --   --   CRP  --   --   --   --   --   --   --   --  12.4*  --  12.5*  DDIMER  --  0.58*  --   --   --   --   --   --  1.27*  --  1.43*  PROCALCITON  --   --   --   --   --   --   --   --  0.68  --   --   LATICACIDVEN  --   --  2.2*  --   --   --   --  2.6*  --  3.4*  --   INR  --   --  1.2  --   --   --   --   --   --   --   --   TSH  --   --   --  1.447  --   --   --   --   --   --   --   HGBA1C  --   --   --   --   --   --   --   --   --  7.7*  --   AMMONIA  --   --   --   --  <9*  --   --   --   --   --   --   BNP  --   --   --   --   --  1,619.5*  --   --   --   --   --      ------------------------------------------------------------------------------------------------------------------ Recent Labs    07/19/20 2103 07/20/20 0416  CHOL  --  145  HDL  --  31*  LDLCALC  --  102*  TRIG 54 62  CHOLHDL  --  4.7    Lab Results  Component Value Date   HGBA1C 7.7 (H) 07/20/2020   ------------------------------------------------------------------------------------------------------------------ Recent Labs    07/19/20 1855  TSH 1.447    Cardiac Enzymes No results for input(s): CKMB, TROPONINI, MYOGLOBIN in the last 168 hours.  Invalid input(s): CK ------------------------------------------------------------------------------------------------------------------    Component Value Date/Time   BNP 1,619.5 (H) 07/19/2020 1907    Micro Results Recent Results (from the past 240 hour(s))  Resp Panel by RT-PCR (Flu A&B, Covid) Nasopharyngeal Swab     Status: None   Collection Time: 07/16/20 10:29 PM   Specimen: Nasopharyngeal Swab; Nasopharyngeal(NP) swabs in vial transport medium  Result Value Ref  Range Status   SARS Coronavirus 2 by RT PCR NEGATIVE NEGATIVE Final    Comment: (NOTE) SARS-CoV-2 target nucleic acids are NOT DETECTED.  The SARS-CoV-2 RNA is generally detectable in upper respiratory specimens during the acute phase of infection. The lowest concentration of SARS-CoV-2 viral copies this assay can detect is 138 copies/mL. A negative result does not preclude SARS-Cov-2 infection and should not be used as the sole basis for treatment or other patient management decisions. A negative result may occur with  improper specimen collection/handling, submission of specimen other than nasopharyngeal swab, presence of viral mutation(s) within the areas targeted by this assay, and inadequate number of viral copies(<138 copies/mL). A negative result must be combined with clinical observations, patient history, and epidemiological information. The  expected result is Negative.  Fact Sheet for Patients:  BloggerCourse.com  Fact Sheet for Healthcare Providers:  SeriousBroker.it  This test is no t yet approved or cleared by the [place] FDA and  has been authorized for detection and/or diagnosis of SARS-CoV-2 by FDA under an Emergency Use Authorization (EUA). This EUA will remain  in effect (meaning this test can be used) for the duration of the COVID-19 declaration under Section 564(b)(1) of the Act, 21 U.S.C.section 360bbb-3(b)(1), unless the authorization is terminated  or revoked sooner.       Influenza A by PCR NEGATIVE NEGATIVE Final   Influenza B by PCR NEGATIVE NEGATIVE Final    Comment: (NOTE) The Xpert Xpress SARS-CoV-2/FLU/RSV plus assay is intended as an aid in the diagnosis of influenza from Nasopharyngeal swab specimens and should not be used as a sole basis for treatment. Nasal washings and aspirates are unacceptable for Xpert Xpress SARS-CoV-2/FLU/RSV testing.  Fact Sheet for Patients: BloggerCourse.com  Fact Sheet for Healthcare Providers: SeriousBroker.it  This test is not yet approved or cleared by the [place] FDA and has been authorized for detection and/or diagnosis of SARS-CoV-2 by FDA under an Emergency Use Authorization (EUA). This EUA will remain in effect (meaning this test can be used) for the duration of the COVID-19 declaration under Section 564(b)(1) of the Act, 21 U.S.C. section 360bbb-3(b)(1), unless the authorization is terminated or revoked.  Performed at [facility], [address], [place], [place] [address]   Resp Panel by RT-PCR (Flu A&B, Covid) Nasopharyngeal Swab     Status: Abnormal   Collection Time: 07/19/20  6:31 PM   Specimen: Nasopharyngeal Swab; Nasopharyngeal(NP) swabs in vial transport medium  Result Value Ref Range Status   SARS Coronavirus 2 by RT PCR  POSITIVE (A) NEGATIVE Final    Comment: RESULT CALLED TO, READ BACK BY AND VERIFIED WITH: Rema Jasmine, RN 07/19/20 at 1947 sk  (NOTE) SARS-CoV-2 target nucleic acids are DETECTED.  The SARS-CoV-2 RNA is generally detectable in upper respiratory specimens during the acute phase of infection. Positive results are indicative of the presence of the identified virus, but do not rule out bacterial infection or co-infection with other pathogens not detected by the test. Clinical correlation with patient history and other diagnostic information is necessary to determine patient infection status. The expected result is Negative.  Fact Sheet for Patients: BloggerCourse.com  Fact Sheet for Healthcare Providers: SeriousBroker.it  This test is not yet approved or cleared by the [place] FDA and  has been authorized for detection and/or diagnosis of SARS-CoV-2 by FDA under an Emergency Use Authorization (EUA).  This EUA will remain in effect (meaning this test can  be used) for the duration of  the COVID-19 declaration under Section 564(b)(1) of the Act, 21 U.S.C. section 360bbb-3(b)(1), unless the authorization is terminated or revoked sooner.     Influenza A by PCR NEGATIVE NEGATIVE Final   Influenza B by PCR NEGATIVE NEGATIVE Final    Comment: (NOTE) The Xpert Xpress SARS-CoV-2/FLU/RSV plus assay is intended as an aid in the diagnosis of influenza from Nasopharyngeal swab specimens and should not be used as a sole basis for treatment. Nasal washings and aspirates are unacceptable for Xpert Xpress SARS-CoV-2/FLU/RSV testing.  Fact Sheet for Patients: BloggerCourse.com  Fact Sheet for Healthcare Providers: SeriousBroker.it  This test is not yet approved or cleared by the [place] FDA and has been authorized for detection and/or diagnosis of SARS-CoV-2 by FDA under an Emergency  Use Authorization (EUA). This EUA will remain in effect (meaning this test can be used) for the duration of the COVID-19 declaration under Section 564(b)(1) of the Act, 21 U.S.C. section 360bbb-3(b)(1), unless the authorization is terminated or revoked.  Performed at [facility], [address] [place], [place] [address]   Blood Culture (routine x 2)     Status: None (Preliminary result)   Collection Time: 07/19/20  6:58 PM   Specimen: BLOOD  Result Value Ref Range Status   Specimen Description BLOOD LEFT ANTECUBITAL  Final   Special Requests   Final    BOTTLES DRAWN AEROBIC AND ANAEROBIC Blood Culture adequate volume   Culture   Final    NO GROWTH 2 DAYS Performed at [facility], [address] [place], [place] [address]    Report Status PENDING  Incomplete    Radiology Reports CT Angio Head W or Wo Contrast  Result Date: 07/19/2020 CLINICAL DATA:  Stroke-like symptoms. Expressive aphasia, headache, confusion EXAM: CT ANGIOGRAPHY HEAD AND NECK TECHNIQUE: Multidetector CT imaging of the head and neck was performed using the standard protocol during bolus administration of intravenous contrast. Multiplanar CT image reconstructions and MIPs were obtained to evaluate the vascular anatomy. Carotid stenosis measurements (when applicable) are obtained utilizing NASCET criteria, using the distal internal carotid diameter as the denominator. CONTRAST:  38mL OMNIPAQUE IOHEXOL 350 MG/ML SOLN COMPARISON:  CT head 07/19/2020 FINDINGS: CTA NECK FINDINGS Aortic arch: Normal aortic arch and proximal great vessels. Bovine branching pattern. Right carotid system: Normal right carotid. Negative for stenosis or dissection. Left carotid system: Normal left carotid. Negative for stenosis or dissection. Vertebral arteries: Both vertebral arteries are normal and patent to the basilar. Right vertebral artery dominant. Skeleton: No acute skeletal abnormality. Other neck: Negative for mass or  adenopathy in the neck. Upper chest: Small bilateral effusions. Patchy nodular airspace disease left upper lobe, not present on CT chest 07/17/2020. Possible pneumonia or edema. Review of the MIP images confirms the above findings CTA HEAD FINDINGS Anterior circulation: Normal cavernous carotid. Right anterior and middle cerebral arteries widely patent. Left anterior cerebral artery patent. Left M1 segment widely patent. Left MCA bifurcation normal. No large vessel occlusion. There is an area of hypoperfusion in the left parietal lobe corresponding to the hypodensity. This is consistent with recent infarct with distal MCA occlusion Posterior circulation: Both vertebral arteries patent to the basilar. PICA patent bilaterally. Basilar widely patent. Posterior circulation normal without stenosis or large vessel occlusion. Venous sinuses: Normal venous enhancement. Anatomic variants: None Review of the MIP images confirms the above findings IMPRESSION: 1. Normal carotid and vertebral arteries in the neck 2. No intracranial large vessel occlusion. There is a small area of hypoperfusion  in the left parietal lobe consistent with subacute infarct. This area shows hypodensity on CT. Electronically Signed   By: Marlan Palau M.D.   On: 07/19/2020 20:58   DG Abd 1 View  Result Date: 07/20/2020 CLINICAL DATA:  Pre MRI EXAM: ABDOMEN - 1 VIEW COMPARISON:  CT 02/24/2020 FINDINGS: No implantable device seen. Patient's metallic belt buckle and zipper noted. Nonobstructive bowel gas pattern. No organomegaly or free air. IMPRESSION: No visible implantable device. Electronically Signed   By: Charlett Nose M.D.   On: 07/20/2020 07:35   CT Head Wo Contrast  Result Date: 07/19/2020 CLINICAL DATA:  Altered mental status. Stroke-like symptoms for 4 days. Expressive aphasia, headache, and confusion. EXAM: CT HEAD WITHOUT CONTRAST TECHNIQUE: Contiguous axial images were obtained from the base of the skull through the vertex without  intravenous contrast. COMPARISON:  None. FINDINGS: Brain: Poorly defined area of low attenuation change extending to the cortical surface and involving the left posterior parietal lobe. This is consistent with acute or subacute infarct. Mild mass effect with effacement of sulci and left lateral ventricle. No midline shift. No abnormal extra-axial fluid collections. No acute intracranial hemorrhage. No ventricular dilatation. Vascular: Mild intracranial arterial vascular calcifications. Skull: Calvarium appears intact. Sinuses/Orbits: Paranasal sinuses and mastoid air cells are clear. Other: None. IMPRESSION: Low-attenuation in the left posterior parietal lobe consistent with acute or subacute infarct. Mild mass effect without midline shift. No acute intracranial hemorrhage. Electronically Signed   By: Burman Nieves M.D.   On: 07/19/2020 19:36   CT Angio Neck W and/or Wo Contrast  Result Date: 07/19/2020 CLINICAL DATA:  Stroke-like symptoms. Expressive aphasia, headache, confusion EXAM: CT ANGIOGRAPHY HEAD AND NECK TECHNIQUE: Multidetector CT imaging of the head and neck was performed using the standard protocol during bolus administration of intravenous contrast. Multiplanar CT image reconstructions and MIPs were obtained to evaluate the vascular anatomy. Carotid stenosis measurements (when applicable) are obtained utilizing NASCET criteria, using the distal internal carotid diameter as the denominator. CONTRAST:  75mL OMNIPAQUE IOHEXOL 350 MG/ML SOLN COMPARISON:  CT head 07/19/2020 FINDINGS: CTA NECK FINDINGS Aortic arch: Normal aortic arch and proximal great vessels. Bovine branching pattern. Right carotid system: Normal right carotid. Negative for stenosis or dissection. Left carotid system: Normal left carotid. Negative for stenosis or dissection. Vertebral arteries: Both vertebral arteries are normal and patent to the basilar. Right vertebral artery dominant. Skeleton: No acute skeletal abnormality.  Other neck: Negative for mass or adenopathy in the neck. Upper chest: Small bilateral effusions. Patchy nodular airspace disease left upper lobe, not present on CT chest 07/17/2020. Possible pneumonia or edema. Review of the MIP images confirms the above findings CTA HEAD FINDINGS Anterior circulation: Normal cavernous carotid. Right anterior and middle cerebral arteries widely patent. Left anterior cerebral artery patent. Left M1 segment widely patent. Left MCA bifurcation normal. No large vessel occlusion. There is an area of hypoperfusion in the left parietal lobe corresponding to the hypodensity. This is consistent with recent infarct with distal MCA occlusion Posterior circulation: Both vertebral arteries patent to the basilar. PICA patent bilaterally. Basilar widely patent. Posterior circulation normal without stenosis or large vessel occlusion. Venous sinuses: Normal venous enhancement. Anatomic variants: None Review of the MIP images confirms the above findings IMPRESSION: 1. Normal carotid and vertebral arteries in the neck 2. No intracranial large vessel occlusion. There is a small area of hypoperfusion in the left parietal lobe consistent with subacute infarct. This area shows hypodensity on CT. Electronically Signed   By: Leonette Most  Chestine Spore M.D.   On: 07/19/2020 20:58   CT Angio Chest PE W and/or Wo Contrast  Result Date: 07/17/2020 CLINICAL DATA:  Dyspnea, left chest pain, productive cough EXAM: CT ANGIOGRAPHY CHEST WITH CONTRAST TECHNIQUE: Multidetector CT imaging of the chest was performed using the standard protocol during bolus administration of intravenous contrast. Multiplanar CT image reconstructions and MIPs were obtained to evaluate the vascular anatomy. CONTRAST:  75mL OMNIPAQUE IOHEXOL 350 MG/ML SOLN COMPARISON:  None FINDINGS: Cardiovascular: There is adequate opacification of the pulmonary arterial tree. There is no intraluminal filling defect identified to suggest acute pulmonary  embolism. Central pulmonary arteries are of normal caliber. No significant coronary artery calcification. Global cardiac size is within normal limits. Mild left ventricular dilation, however, is noted. No pericardial effusion. The thoracic aorta is unremarkable. Mediastinum/Nodes: No enlarged mediastinal, hilar, or axillary lymph nodes. Thyroid gland, trachea, and esophagus demonstrate no significant findings. Lungs/Pleura: Small right pleural effusion is present with minimal associated right basilar atelectasis. The lungs are otherwise clear. No pneumothorax. No central obstructing lesion. Upper Abdomen: No acute abnormality. Musculoskeletal: Osseous structures are age-appropriate. Gynecomastia noted. Review of the MIP images confirms the above findings. IMPRESSION: No pulmonary embolism. Mild left ventricular dilation. This could be better assessed with echocardiography. Small right pleural effusion. Electronically Signed   By: Helyn Numbers MD   On: 07/17/2020 01:55   MR Brain Wo Contrast (neuro protocol)  Result Date: 07/20/2020 CLINICAL DATA:  45 year old male with stroke-like symptoms for 4 days. 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 COVID-19. EXAM: MRI HEAD WITHOUT CONTRAST TECHNIQUE: Multiplanar, multiecho pulse sequences of the brain and surrounding structures were obtained without intravenous contrast. COMPARISON:  CT head, CTA head and neck yesterday. FINDINGS: Brain: Confluent restricted diffusion from the posterior temporal lobe through the lateral left occipital lobe tracking to the left occipital pole, an area of 9.5 cm (series 2, image 29) corresponding to the CT finding yesterday. Multifocal petechial hemorrhage (series 7, image 61) and confluent cytotoxic edema but no malignant hemorrhagic transformation and only mild mass effect on the left lateral ventricle without midline shift. Superimposed small cortical restricted diffusion at the  right superior parietal lobe (series 2, image 44). Mild if any associated T2 and FLAIR hyperintensity. No hemorrhage. No other restricted diffusion. No midline shift, evidence of mass lesion, ventriculomegaly, extra-axial collection. Patchy and nodular superimposed bilateral cerebral white matter T2 and FLAIR hyperintensity in a nonspecific configuration. The corpus callosum and temporal lobes are spared. No chronic cortical encephalomalacia. But there do appear to be several chronic microhemorrhages in the left lentiform nuclei (series 7, images 52 and 56). No other chronic cerebral blood products. Elsewhere the deep gray nuclei, brainstem and cerebellum are within normal limits. Cervicomedullary junction and pituitary are within normal limits. Vascular: Major intracranial vascular flow voids are preserved. Skull and upper cervical spine: Negative. Sinuses/Orbits: Disconjugate gaze otherwise negative orbits. Paranasal sinuses and mastoids are stable and well pneumatized. Other: None. IMPRESSION: 1. Large acute infarct in the posterior left hemisphere with petechial hemorrhage and cytotoxic edema, but no malignant hemorrhagic transformation or significant intracranial mass effect. 2. Solitary small acute cortical infarct superimposed in the superior right parietal lobe. 3. Chronic micro-hemorrhages in the left lentiform nuclei. Bilateral periventricular white matter signal changes which may therefore be chronic small vessel disease related. Electronically Signed   By: Odessa Fleming M.D.   On: 07/20/2020 12:00   DG Chest Portable 1 View  Result Date: 07/19/2020  CLINICAL DATA:  Fever, altered level of consciousness EXAM: PORTABLE CHEST 1 VIEW COMPARISON:  07/17/2020 FINDINGS: Single frontal view of the chest demonstrates an enlarged cardiac silhouette. Increased central vascular congestion, with interval development of bilateral interstitial prominence. No large effusion or pneumothorax. No acute bony abnormalities.  IMPRESSION: 1. Worsening volume status, with mild interstitial edema on today's exam. Electronically Signed   By: Randa Ngo M.D.   On: 07/19/2020 18:49   DG Chest Port 1 View  Result Date: 07/16/2020 CLINICAL DATA:  Intermittent sharp left-sided chest pain, short of breath, productive cough EXAM: PORTABLE CHEST 1 VIEW COMPARISON:  05/29/2018 FINDINGS: The heart size and mediastinal contours are within normal limits. Both lungs are clear. The visualized skeletal structures are unremarkable. IMPRESSION: No active disease. Electronically Signed   By: Randa Ngo M.D.   On: 07/16/2020 17:21   ECHOCARDIOGRAM COMPLETE  Result Date: 07/20/2020    ECHOCARDIOGRAM REPORT   Patient Name:   ABRAR BILTON Date of Exam: 07/20/2020 Medical Rec #:  [id number]     Height:       73.0 in Accession #:    [id number]    Weight:       262.3 lb Date of Birth:  1975/08/27     BSA:          2.415 m Patient Age:    62 years      BP:           153/106 mmHg Patient Gender: M             HR:           108 bpm. Exam Location:  Inpatient Procedure: 2D Echo, Color Doppler, Cardiac Doppler and Intracardiac            Opacification Agent Indications:    CHF-Acute Systolic S49.67  History:        Patient has prior history of Echocardiogram examinations, most                 recent 01/04/2017. Risk Factors:Hypertension, Dyslipidemia and                 Diabetes.  Sonographer:    Bernadene Person RDCS Referring Phys: [id number] Trion  1. A small (8 mm diameter), almost spherical, slightly mobile ventricular apical thrombus is seen. Left ventricular ejection fraction, by estimation, is 20 to 25%. The left ventricle has severely decreased function. The left ventricle demonstrates global hypokinesis. The left ventricular internal cavity size was moderately dilated. Indeterminate diastolic filling due to E-A fusion.  2. Right ventricular systolic function is mildly reduced. The right ventricular size is normal. Tricuspid  regurgitation signal is inadequate for assessing PA pressure.  3. Left atrial size was moderately dilated.  4. A small pericardial effusion is present. The pericardial effusion is localized near the right atrium.  5. The mitral valve is normal in structure. No evidence of mitral valve regurgitation. No evidence of mitral stenosis.  6. The aortic valve is tricuspid. Aortic valve regurgitation is not visualized. No aortic stenosis is present.  7. The inferior vena cava is normal in size with greater than 50% respiratory variability, suggesting right atrial pressure of 3 mmHg. Comparison(s): Prior images unable to be directly viewed, comparison made by report only. The left ventricular function is worsened. A new apical LV thrombus is seen. FINDINGS  Left Ventricle: A small (8 mm diameter), almost spherical, slightly mobile ventricular apical thrombus is seen. Left ventricular ejection fraction, by estimation,  is 20 to 25%. The left ventricle has severely decreased function. The left ventricle demonstrates global hypokinesis. Definity contrast agent was given IV to delineate the left ventricular endocardial borders. The left ventricular internal cavity size was moderately dilated. There is no left ventricular hypertrophy. Indeterminate diastolic filling due to E-A fusion. Right Ventricle: The right ventricular size is normal. No increase in right ventricular wall thickness. Right ventricular systolic function is mildly reduced. Tricuspid regurgitation signal is inadequate for assessing PA pressure. Left Atrium: Left atrial size was moderately dilated. Right Atrium: Right atrial size was normal in size. Pericardium: A small pericardial effusion is present. The pericardial effusion is localized near the right atrium. Mitral Valve: The mitral valve is normal in structure. No evidence of mitral valve regurgitation. No evidence of mitral valve stenosis. Tricuspid Valve: The tricuspid valve is normal in structure. Tricuspid  valve regurgitation is not demonstrated. Aortic Valve: The aortic valve is tricuspid. Aortic valve regurgitation is not visualized. No aortic stenosis is present. Pulmonic Valve: The pulmonic valve was grossly normal. Pulmonic valve regurgitation is not visualized. Aorta: The aortic root and ascending aorta are structurally normal, with no evidence of dilitation. Venous: The inferior vena cava is normal in size with greater than 50% respiratory variability, suggesting right atrial pressure of 3 mmHg. IAS/Shunts: No atrial level shunt detected by color flow Doppler.  LEFT VENTRICLE PLAX 2D LVIDd:         6.60 cm LVIDs:         5.40 cm LV PW:         1.10 cm LV IVS:        0.90 cm LVOT diam:     2.10 cm LV SV:         37 LV SV Index:   16 LVOT Area:     3.46 cm  RIGHT VENTRICLE RV S prime:     7.89 cm/s TAPSE (M-mode): 2.0 cm LEFT ATRIUM              Index       RIGHT ATRIUM           Index LA diam:        4.20 cm  1.74 cm/m  RA Area:     17.50 cm LA Vol (A2C):   98.6 ml  40.83 ml/m RA Volume:   46.80 ml  19.38 ml/m LA Vol (A4C):   101.0 ml 41.83 ml/m LA Biplane Vol: 103.0 ml 42.66 ml/m  AORTIC VALVE LVOT Vmax:   76.50 cm/s LVOT Vmean:  54.750 cm/s LVOT VTI:    0.108 m  AORTA Ao Root diam: 3.60 cm Ao Asc diam:  3.10 cm  SHUNTS Systemic VTI:  0.11 m Systemic Diam: 2.10 cm Rachelle Hora Croitoru MD Electronically signed by Thurmon Fair MD Signature Date/Time: 07/20/2020/1:15:21 PM    Final

## 2020-07-21 NOTE — Evaluation (Signed)
Occupational Therapy Evaluation Patient Details Name: Rodney Kane MRN: [id number] DOB: 1976/01/14 Today's Date: 07/21/2020    History of Present Illness 45 y.o. 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 CT head showing acute/subacute infarct in the left posterior parietal lobe. 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 At time of eval, pt able to complete bed mobility at min guard level and sit <> stands with min A due to mild R lateral lean. Pt able to mobilize in hallway at min guard level and navigate obstacles with min VC's. Pt with receptive and expressive difficulties (expressive>receptive) and requires increased time, rephrasing and processing to complete basic commands. He was unaware he had a stroke/COVID. Noted R inattention throughout session- unsure if there is a visual component as well due to pts language deficits/difficulty reporting. Also noted proprioceptive, fine motor, and gross motor deficits in RUE. He is able to incorporate RUE functionally, but with moderate difficulty. VSS on RA. Given current status, recommend pt follow up with OP neuro OT for continued neuro progression. Will continue to follow as acutely admitted.     Follow Up Recommendations  Outpatient OT;Other (comment) (OP Neuro)    Equipment Recommendations  Tub/shower seat    Recommendations for Other Services       Precautions / Restrictions  Precautions Precautions: None Restrictions Weight Bearing Restrictions: No      Mobility Bed Mobility Overal bed mobility: Needs Assistance Bed Mobility: Supine to Sit     Supine to sit: HOB elevated;Min guard     General bed mobility comments: min guard for safety, increased time and effort    Transfers Overall transfer level: Needs assistance Equipment used: None Transfers: Sit to/from Stand Sit to Stand: Min assist         General transfer comment: good power up, assist for steadying, R lateral lean    Balance Overall balance assessment: Needs assistance Sitting-balance support: Feet supported;No upper extremity supported Sitting balance-Leahy Scale: Fair     Standing balance support: During functional activity;No upper extremity supported Standing balance-Leahy Scale: Fair                             ADL either performed or assessed with clinical judgement   ADL Overall ADL's : Needs assistance/impaired Eating/Feeding: Set up;Sitting   Grooming: Minimal assistance;Standing Grooming Details (indicate cue type and reason): difficulty with opening packaging for tooth brush and manipulating toothpaste cap Upper Body Bathing: Set up;Sitting   Lower Body Bathing: Set up;Sitting/lateral leans;Sit to/from stand   Upper Body Dressing : Set up;Sitting   Lower Body Dressing: Minimal assistance;Sit to/from stand;Sitting/lateral leans   Toilet Transfer: Min guard;Ambulation;Regular Toilet;Grab bars Toilet Transfer Details (indicate cue type and reason): able to mobilize to toilet in room         Functional mobility during ADLs: Min guard;Cueing for safety       Vision Baseline Vision/History: No visual deficits Patient Visual Report: No change from baseline Vision Assessment?: Yes Tracking/Visual Pursuits: Decreased smoothness of eye movement to RIGHT superior  field;Decreased smoothness of eye movement to RIGHT inferior field;Decreased smoothness  of horizontal tracking;Requires cues, head turns, or add eye shifts to track;Impaired - to be further tested in functional context Visual Fields: Impaired-to be further tested in functional context Additional Comments: difficult to fully assess due to language deficits. Noted decreased tracking speed and inattention to R visual field. Unsure of severity of visual deficits or if it largely sensory     Perception     Praxis      Pertinent Vitals/Pain Pain Assessment: Faces Faces Pain Scale: No hurt     Hand Dominance Left   Extremity/Trunk Assessment Upper Extremity Assessment Upper Extremity Assessment: RUE deficits/detail RUE Deficits / Details: decreased proprioception and impaired sensation RUE Sensation: decreased proprioception;decreased light touch RUE Coordination: decreased fine motor;decreased gross motor   Lower Extremity Assessment Lower Extremity Assessment: Defer to PT evaluation       Communication Communication Communication: Expressive difficulties;Receptive difficulties   Cognition Arousal/Alertness: Awake/alert Behavior During Therapy: WFL for tasks assessed/performed Overall Cognitive Status: Impaired/Different from baseline Area of Impairment: Following commands;Problem solving;Memory                   Current Attention Level: Selective Memory: Decreased short-term memory Following Commands: Follows one step commands with increased time;Follows multi-step commands with increased time     Problem Solving: Requires verbal cues;Requires tactile cues;Slow processing General Comments: Difficult to fully assess cognition due to receptive/expressive language deficits. Pt does show awareness to his language deficits- but was unaware he had COVID or had had a stroke. Requires frequent repetition/rephrasing to understand commands   General Comments       Exercises     Shoulder Instructions      Home Living Family/patient expects to be discharged  to:: Private residence Living Arrangements: Children (son) Available Help at Discharge: Family;Available PRN/intermittently Type of Home: Apartment Home Access: Level entry     Home Layout: One level     Bathroom Shower/Tub: Chief Strategy Officer: Standard     Home Equipment: None   Additional Comments: daughter provided home set up and PLOF      Prior Functioning/Environment Level of Independence: Independent        Comments: works at [employer] and [employer] Problem List: Decreased strength;Impaired vision/perception;Decreased knowledge of use of DME or AE;Decreased range of motion;Decreased coordination;Decreased activity tolerance;Decreased cognition;Impaired UE functional use;Impaired balance (sitting and/or standing);Decreased safety awareness;Impaired sensation      OT Treatment/Interventions: Self-care/ADL training;Visual/perceptual remediation/compensation;Therapeutic exercise;Patient/family education;Balance training;Neuromuscular education;Energy conservation;Therapeutic activities;DME and/or AE instruction;Cognitive remediation/compensation    OT Goals(Current goals can be found in the care plan section) Acute Rehab OT Goals Patient Stated Goal: watch [place] [employer] play OT Goal Formulation: With patient Time For Goal Achievement: 08/04/20 Potential to Achieve Goals: Good  OT Frequency: Min 3X/week   Barriers to D/C:            Co-evaluation              AM-PAC OT "6 Clicks" Daily Activity     Outcome Measure Help from another person eating meals?: A Little Help from another person taking care of personal grooming?: A Little Help from another person toileting, which includes using toliet, bedpan, or urinal?: A Little Help from another person bathing (including washing, rinsing, drying)?: A Little Help from another person to put on and taking off regular upper body clothing?: A Little Help from another person to put on  and  taking off regular lower body clothing?: A Little 6 Click Score: 18   End of Session Equipment Utilized During Treatment: Gait belt Nurse Communication: Mobility status  Activity Tolerance: Patient tolerated treatment well Patient left: in chair;with call bell/phone within reach;with chair alarm set  OT Visit Diagnosis: Unsteadiness on feet (R26.81);Other abnormalities of gait and mobility (R26.89);Cognitive communication deficit (R41.841);Muscle weakness (generalized) (M62.81) Symptoms and signs involving cognitive functions: Cerebral infarction                Time: 0920-1016 OT Time Calculation (min): 56 min Charges:  OT General Charges $OT Visit: 1 Visit OT Evaluation $OT Eval Moderate Complexity: 1 Mod OT Treatments $Self Care/Home Management : 8-22 mins  Zenovia Jarred, MSOT, OTR/L Acute Rehabilitation Services [facility] Office Number: [phone] Pager: [phone] Zenovia Jarred 07/21/2020, 2:24 PM

## 2020-07-21 NOTE — Evaluation (Signed)
Speech Language Pathology Evaluation Patient Details Name: Rodney Kane MRN: [id number] DOB: Nov 26, 1975 Today's Date: 07/21/2020 Time: 4098-1191 SLP Time Calculation (min) (ACUTE ONLY): 25 min  Problem List:  Patient Active Problem List   Diagnosis Date Noted  . Severe sepsis (HCC) 07/20/2020  . Sinus tachycardia 07/20/2020  . Acute CVA (cerebrovascular accident) (HCC) 07/20/2020  . COVID-19 virus infection 07/19/2020  . Noncompliance with medication regimen 01/04/2020  . Hyperglycemia 01/04/2020  . Urine ketones 01/04/2020  . Essential hypertension 01/04/2020  . NICM (nonischemic cardiomyopathy) (HCC) 01/16/2017  . 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 Acute combined systolic and diastolic heart failure (HCC)   . Elevated troponin   . CHF exacerbation (HCC) 01/02/2017  . 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 Diabetes (HCC) 01/02/2017   Past Medical History:  Past Medical History:  Diagnosis Date  . Diabetes mellitus without complication (HCC)   . Hemoglobin A1C greater than 9%, indicating poor diabetic control 12/2019  . Hyperglycemia 12/2019  . Hyperlipidemia 12/2019  . Hypertension   . Hypertensive urgency 12/2019  . Noncompliance with medication regimen 12/2019  . Urine ketones 12/2019  . Vitamin D deficiency 12/2019   Past Surgical History:  Past Surgical History:  Procedure Laterality Date  . left knee surgery    . RIGHT/LEFT HEART CATH AND CORONARY ANGIOGRAPHY N/A 01/05/2017   Procedure: Right/Left Heart Cath and Coronary Angiography;  Surgeon: [name], MD;  Location: [facility] INVASIVE CV LAB;  Service: Cardiovascular;  Laterality: N/A;   HPI:  45 y.o. male with medical history significant of chronic combined systolic and diastolic CHF/nonischemic cardiomyopathy (EF 30-35%), hypertension, hyperlipidemia, insulin-dependent type 2 diabetes, GERD, history of medication noncompliance presenting to the ED via EMS 12/27 for evaluation of  expressive aphasia and staggering gait. CT head showing acute/subacute infarct in the left posterior parietal lobe. Pt with persistent tachycardia, Admitted for treatment of severe sepsis secondary to COVID, Acute on chronic combined systolic and diastolic CHF/nonischemic cardiomyopathy and acutee/subacute L MCA CVA   Assessment / Plan / Recommendation Clinical Impression  Patient presents with a mixed receptive-expressive aphasia with receptive language >expressive language abilities. Speech was not fluent and at phrase level, he exhibited some halting speech, phonemic and semantic paraphasias and mild phoneme repetitions. Patient was able to follow one-step directions to point to window, etc, however he would become easily confused during some one step and most 2-step instructions, and would perseverate on the last thing that was asked. Patient completed confrontational naming of objects in room without difficulty but unable to complete any divergent or convergent naming tasks. He was able to write his name when requested, and when instructed to write his address, he wrote "[address]" but was unable to write any street, city, etc. He was able to produce some disjointed phrases to describe [employer] picture, ie "The kids in the cookie, in the kids", etc. Patient did not seem to comprehend or react appropriately when SLP informed him that his stroke affected his language production. He at times seemed aware of some errors when writing or speaking, but did not exhibit the level of frustration that would be normal for someone with this level of language impairment. Patient will benefit from intensive language therapy during this hospital stay and after discharge, likely at outpatient level setting.    SLP Assessment  SLP Recommendation/Assessment: Patient needs continued Speech Lanaguage Pathology Services SLP Visit Diagnosis: Aphasia (R47.01)    Follow Up Recommendations  Outpatient SLP    Frequency and  Duration min 2x/week  1 week      SLP Evaluation Cognition  Overall Cognitive Status: Impaired/Different from baseline Arousal/Alertness: Awake/alert Orientation Level: Oriented to person Attention: Sustained Sustained Attention: Appears intact Memory:  (unable to assess due to aphasia) Behaviors: Perseveration Safety/Judgment: Impaired       Comprehension  Auditory Comprehension Overall Auditory Comprehension: Impaired Yes/No Questions: Impaired Basic Biographical Questions: 51-75% accurate Basic Immediate Environment Questions: 0-24% accurate Commands: Impaired One Step Basic Commands: 50-74% accurate Two Step Basic Commands: 0-24% accurate Conversation: Simple EffectiveTechniques: Extra processing time Visual Recognition/Discrimination Discrimination: Not tested    Expression Expression Primary Mode of Expression: Verbal Verbal Expression Overall Verbal Expression: Impaired Initiation: No impairment Automatic Speech: Name Level of Generative/Spontaneous Verbalization: Phrase Repetition: Impaired Level of Impairment: Word level Naming: No impairment Pragmatics: Impairment Impairments: Abnormal affect;Interpretation of nonverbal communication Effective Techniques: Phonemic cues;Semantic cues;Open ended questions Non-Verbal Means of Communication: Not applicable Written Expression Dominant Hand: Left   Oral / Motor  Oral Motor/Sensory Function Overall Oral Motor/Sensory Function: Within functional limits Motor Speech Overall Motor Speech: Appears within functional limits for tasks assessed Respiration: Within functional limits Phonation: Normal Resonance: Within functional limits Articulation: Within functional limitis Intelligibility: Intelligible   GO                   Angela Nevin, MA, CCC-SLP Speech Therapy

## 2020-07-22 DIAGNOSIS — U071 COVID-19: Secondary | ICD-10-CM | POA: Diagnosis not present

## 2020-07-22 LAB — PROTIME-INR
INR: 1.2 (ref 0.8–1.2)
Prothrombin Time: 15.1 seconds (ref 11.4–15.2)

## 2020-07-22 LAB — COMPREHENSIVE METABOLIC PANEL
ALT: 23 U/L (ref 0–44)
AST: 31 U/L (ref 15–41)
Albumin: 3 g/dL — ABNORMAL LOW (ref 3.5–5.0)
Alkaline Phosphatase: 56 U/L (ref 38–126)
Anion gap: 12 (ref 5–15)
BUN: 27 mg/dL — ABNORMAL HIGH (ref 6–20)
CO2: 23 mmol/L (ref 22–32)
Calcium: 8.9 mg/dL (ref 8.9–10.3)
Chloride: 102 mmol/L (ref 98–111)
Creatinine, Ser: 1.11 mg/dL (ref 0.61–1.24)
GFR, Estimated: >60 mL/min (ref >60)
Glucose, Bld: 244 mg/dL — ABNORMAL HIGH (ref 70–99)
Potassium: 4.3 mmol/L (ref 3.5–5.1)
Sodium: 137 mmol/L (ref 135–145)
Total Bilirubin: 0.4 mg/dL (ref 0.3–1.2)
Total Protein: 6.6 g/dL (ref 6.5–8.1)

## 2020-07-22 LAB — CBC WITH DIFFERENTIAL/PLATELET
Abs Immature Granulocytes: 0.02 10*3/uL (ref 0.00–0.07)
Basophils Absolute: 0 10*3/uL (ref 0.0–0.1)
Basophils Relative: 0 %
Eosinophils Absolute: 0 10*3/uL (ref 0.0–0.5)
Eosinophils Relative: 0 %
HCT: 40.5 % (ref 39.0–52.0)
Hemoglobin: 13.5 g/dL (ref 13.0–17.0)
Immature Granulocytes: 0 %
Lymphocytes Relative: 24 %
Lymphs Abs: 1.7 10*3/uL (ref 0.7–4.0)
MCH: 26 pg (ref 26.0–34.0)
MCHC: 33.3 g/dL (ref 30.0–36.0)
MCV: 77.9 fL — ABNORMAL LOW (ref 80.0–100.0)
Monocytes Absolute: 0.7 10*3/uL (ref 0.1–1.0)
Monocytes Relative: 9 %
Neutro Abs: 4.8 10*3/uL (ref 1.7–7.7)
Neutrophils Relative %: 67 %
Platelets: 300 10*3/uL (ref 150–400)
RBC: 5.2 MIL/uL (ref 4.22–5.81)
RDW: 13.2 % (ref 11.5–15.5)
WBC: 7.2 10*3/uL (ref 4.0–10.5)
nRBC: 0 % (ref 0.0–0.2)

## 2020-07-22 LAB — GLUCOSE, CAPILLARY
Glucose-Capillary: 187 mg/dL — ABNORMAL HIGH (ref 70–99)
Glucose-Capillary: 230 mg/dL — ABNORMAL HIGH (ref 70–99)
Glucose-Capillary: 257 mg/dL — ABNORMAL HIGH (ref 70–99)
Glucose-Capillary: 239 mg/dL — ABNORMAL HIGH (ref 70–99)

## 2020-07-22 LAB — MAGNESIUM: Magnesium: 2 mg/dL (ref 1.7–2.4)

## 2020-07-22 LAB — HEPARIN LEVEL (UNFRACTIONATED)
Heparin Unfractionated: 0.68 IU/mL (ref 0.30–0.70)
Heparin Unfractionated: 0.65 IU/mL (ref 0.30–0.70)

## 2020-07-22 LAB — D-DIMER, QUANTITATIVE: D-Dimer, Quant: 0.71 ug/mL-FEU — ABNORMAL HIGH (ref 0.00–0.50)

## 2020-07-22 LAB — C-REACTIVE PROTEIN: CRP: 7.6 mg/dL — ABNORMAL HIGH (ref <1.0)

## 2020-07-22 LAB — HIV-1 RNA QUANT-NO REFLEX-BLD
HIV 1 RNA Quant: <20 copies/mL
LOG10 HIV-1 RNA: UNDETERMINED log10copy/mL

## 2020-07-22 MED ORDER — WARFARIN - PHARMACIST DOSING INPATIENT
Freq: Every day | Status: DC
  Administered 2020-07-24: 5

## 2020-07-22 MED ORDER — LINAGLIPTIN 5 MG PO TABS
5.0000 mg | ORAL_TABLET | Freq: Every day | ORAL | Status: DC
  Administered 2020-07-22 – 2020-07-28 (×7): 5 mg via ORAL
  Filled 2020-07-22 (×7): qty 1

## 2020-07-22 MED ORDER — INSULIN GLARGINE 100 UNIT/ML ~~LOC~~ SOLN
5.0000 [IU] | Freq: Every day | SUBCUTANEOUS | Status: DC
  Administered 2020-07-22 – 2020-07-28 (×7): 5 [IU] via SUBCUTANEOUS
  Filled 2020-07-22 (×7): qty 0.05

## 2020-07-22 MED ORDER — WARFARIN SODIUM 7.5 MG PO TABS
7.5000 mg | ORAL_TABLET | Freq: Once | ORAL | Status: AC
  Administered 2020-07-22: 7.5 mg via ORAL
  Filled 2020-07-22: qty 1

## 2020-07-22 NOTE — Progress Notes (Addendum)
ANTICOAGULATION CONSULT NOTE  Pharmacy Consult for Heparin and Warfarin Indication: LV thrombus with recent CVA  No Known Allergies  Patient Measurements: Height: 6\' 1"  (185.4 cm) Weight: 120.5 kg (265 lb 10.5 oz) IBW/kg (Calculated) : 79.9 Heparin Dosing Weight: 105.6 kg  Vital Signs: Temp: 98.3 F (36.8 C) (01/02 0455) Temp Source: Oral (01/02 0455) BP: 122/83 (01/02 0455) Pulse Rate: 93 (01/02 0455)  Labs: Recent Labs    07/19/20 1853 07/19/20 1907 07/19/20 1921 07/19/20 2107 07/20/20 2223 07/21/20 0512 07/22/20 0208 07/22/20 1421  HGB 14.1  --  15.3  --   --  13.5 13.5  --   HCT 44.2  --  45.0  --   --  41.3 40.5  --   PLT 242  --   --   --   --  263 300  --   APTT 33  --   --   --   --   --   --   --   LABPROT 14.5  --   --   --   --   --   --  15.1  INR 1.2  --   --   --   --   --   --  1.2  HEPARINUNFRC  --   --   --   --    < > 0.67 0.68 0.65  CREATININE 1.19  --   --   --   --  1.15 1.11  --   TROPONINIHS  --  45*  --  49*  --   --   --   --    < > = values in this interval not displayed.    Estimated Creatinine Clearance: 114.2 mL/min (by C-G formula based on SCr of 1.11 mg/dL).   Assessment: Patient is a 19 yom that is admitted for a CVA. Patient was also found to have a LV thrombus on evaluation and is on no prior anticoagulation. Pharmacy has been asked to bridge heparin at this time for LV thrombus. Most recent HL is above goal at 0.65 after a dose reduction on this morning. Patient will be starting warfarin and requiring education prior to discharge. Patient is currently on doxycycline which can increase the effects of warfarin. Neuro has agreed with an INR goal of 2-2.5. Baseline INR today is 1.2. No signs of bleeding per nurse.   Goal of Therapy:  Heparin level 0.3-0.5 units/ml Monitor platelets by anticoagulation protocol: Yes  INR Goal 2-2.5 per neuro   Plan:  - Decrease heparin to 900units/hour to target lower end of goal  - Warfarin 7.5mg x1  today  - Monitor for s/sx of bleed  - HL and CBC daily  - Daily INR, continue heparin bridge until INR therapeutic   54, PharmD, MBA Pharmacy Resident [phone] 07/22/2020 3:04 PM

## 2020-07-22 NOTE — Progress Notes (Signed)
ANTICOAGULATION CONSULT NOTE  Pharmacy Consult for Heparin Indication: LV thrombus with recent CVA  No Known Allergies  Patient Measurements: Height: 6\' 1"  (185.4 cm) Weight: 120.5 kg (265 lb 10.5 oz) IBW/kg (Calculated) : 79.9 Heparin Dosing Weight: 105.6 kg  Vital Signs: Temp: 98.3 F (36.8 C) (01/02 0455) Temp Source: Oral (01/02 0455) BP: 122/83 (01/02 0455) Pulse Rate: 93 (01/02 0455)  Labs: Recent Labs    07/19/20 1853 07/19/20 1907 07/19/20 1921 07/19/20 2107 07/20/20 2223 07/21/20 0512 07/22/20 0208  HGB 14.1  --  15.3  --   --  13.5 13.5  HCT 44.2  --  45.0  --   --  41.3 40.5  PLT 242  --   --   --   --  263 300  APTT 33  --   --   --   --   --   --   LABPROT 14.5  --   --   --   --   --   --   INR 1.2  --   --   --   --   --   --   HEPARINUNFRC  --   --   --   --  0.61 0.67 0.68  CREATININE 1.19  --   --   --   --  1.15 1.11  TROPONINIHS  --  45*  --  49*  --   --   --     Estimated Creatinine Clearance: 114.2 mL/min (by C-G formula based on SCr of 1.11 mg/dL).   Assessment: Patient is a 71 yom that is admitted for a CVA. Patient was also found to have a LV thrombus on evaluation and is on no prior anticoagulation. Pharmacy has been asked to dose heparin at this time for the LV thrombus with a neuro protocol 2/2 CVA.  Will plan to transition to oral therapy once more stable per primary team (warfarin). HL this AM was above goal at 0.68 after a dose reduction on 1/1.   Goal of Therapy:  Heparin level 0.3-0.5 units/ml Monitor platelets by anticoagulation protocol: Yes   Plan:  - Decrease heparin to 1000units/hour to target lower end of goal  - Will check 6-hour heparin level - Monitor for s/sx of bleed  - HL and CBC daily   54, PharmD, [facility] Pharmacy Resident [phone] 07/22/2020 7:13 AM

## 2020-07-22 NOTE — Progress Notes (Signed)
STROKE TEAM PROGRESS NOTE   INTERVAL HISTORY Patient is sitting up in bed.  He has no complaints.  He is on IV heparin.  Warfarin has not yet been restarted.  Echocardiogram shows diminished ejection fraction of 20% with LV thrombus.  CT angiogram shows no significant large vessel stenosis or occlusion.  Vitals:   07/21/20 1229 07/21/20 1610 07/21/20 1959 07/22/20 0455  BP: 119/90 (!) 133/100 107/77 122/83  Pulse:  89 91 93  Resp:  18 20 16   Temp: 98.5 F (36.9 C) 98.6 F (37 C) 98.6 F (37 C) 98.3 F (36.8 C)  TempSrc: Oral Oral Oral Oral  SpO2:  99% 100% 98%  Weight:      Height:       CBC:  Recent Labs  Lab 07/21/20 0512 07/22/20 0208  WBC 5.3 7.2  NEUTROABS 3.0 4.8  HGB 13.5 13.5  HCT 41.3 40.5  MCV 77.3* 77.9*  PLT 263 300   Basic Metabolic Panel:  Recent Labs  Lab 07/19/20 1853 07/19/20 1921 07/21/20 0512 07/22/20 0208  NA 130*   < > 134* 137  K 3.8   < > 3.1* 4.3  CL 94*  --  97* 102  CO2 21*  --  25 23  GLUCOSE 182*  --  186* 244*  BUN 15  --  22* 27*  CREATININE 1.19  --  1.15 1.11  CALCIUM 9.0  --  8.3* 8.9  MG 1.8  --   --  2.0   < > = values in this interval not displayed.   Lipid Panel:  Recent Labs  Lab 07/20/20 0416  CHOL 145  TRIG 62  HDL 31*  CHOLHDL 4.7  VLDL 12  LDLCALC 07/22/20*   HgbA1c:  Recent Labs  Lab 07/20/20 0416  HGBA1C 7.7*   Urine Drug Screen:  Recent Labs  Lab 07/20/20 2016  LABOPIA NONE DETECTED  COCAINSCRNUR NONE DETECTED  LABBENZ NONE DETECTED  AMPHETMU NONE DETECTED  THCU NONE DETECTED  LABBARB NONE DETECTED    Alcohol Level  Recent Labs  Lab 07/19/20 1856  ETH <10    IMAGING past 24 hours No results found.  PHYSICAL EXAM  Temp:  [97.6 F (36.4 C)-98.6 F (37 C)] 98.3 F (36.8 C) (01/02 0455) Pulse Rate:  [89-93] 93 (01/02 0455) Resp:  [15-20] 16 (01/02 0455) BP: (107-133)/(77-100) 122/83 (01/02 0455) SpO2:  [97 %-100 %] 98 % (01/02 0455)  General -Pleasant middle-age African-American male  not in distress.   . Afebrile. Head is nontraumatic. Neck is supple without bruit.    Cardiac exam no murmur or gallop. Lungs are clear to auscultation. Distal pulses are well felt. Neurological Exam :  - awake alert, eyes open, receptive aphasia > expressive aphasia.  Able to have limited language output, however not able to repeat or name.  Intermittently follow some simple commands, but not all the commands. PERRL, EOMI, tracking bilaterally.  Inconsistently blinking to visual threat bilaterally.  No significant facial droop.  Tongue protrusion midline.  Bilateral upper and lower extremity symmetrical strength. FTN intact bilaterally with prompt.  Sensation not corporative.  Gait not tested   ASSESSMENT/PLAN [name] is a 45 y.o. male with history of diabetes, hypertension, hyperlipidemia, noncompliance to medications, nonischemic cardiomyopathy presenting with confusion and altered mental status w/ 3d hx SOB, CP. Now globally aphasic w/ positive COVID (which was neg 3 days prior) found to have LV thrombus.  Stroke:  Large L MCA and punctate R frontoparietal infarcts,  cardioembolic secondary to apical thrombus in the setting of chronic cardiomyopathy and COVID PNA  CT head L posterior parietal hypodensity.   CTA head no LVO. L parietal hypodensity  CTA neck Unremarkable   MRI  Large L MCA (posterior temporal to L occipital) infarct w/ petechial hemorrhagic transformation and cytotoxic edema.  Punctate R parietal cortical infarct. Chronic L lentiform nucleus microhemorrhages. B periventricular white matter disease.  2D Echo apical thrombus. EF 20-25%.   LDL 102  HgbA1c 7.7  VTE prophylaxis - Heparin IV per stroke protocol  aspirin 81 mg daily prior to admission, now on  heparin IV per stroke protocol  Therapy recommendations:  Outpt PT and OT planned  Disposition:  pending  Chronic cardiomyopathy LV thrombus  12/2016 EF 30 to 35%  This admission EF 20-25%  LV apical  thrombus found on TTE  Now on heparin IV per stroke protocol - Heparin level this morning 0.67 above goal and decreased to 1150 units/hour.   Cardiology on board - consult Dr Shirlee Latch 12/31 - Heparin transition will need to transition to coumadin (not DOAC) for thrombus - HF meds (Coreg ; Entresto; Spironolactone) when stable from neurology standpoint.  Goal INR 2.0 - 2.5 per Dr Pearlean Brownie - appreciate pharmacy assistance  Severe Sepsis secondary to COVID-19 PNA  Positive test on admission (neg 3 days prior)  TM 102.2  CXR w/ pulmonary vascular congestion  D-dimer 1.27  Fibrinogen 608  On remdesivir, steroids   Zosyn (off) >> Vancomycin - (off) ; Rocephin ; Doxycycline Started 07/20/20  Hypertension  Stable on the low side . avoid low BP . Long-term BP goal normotensive  Hyperlipidemia  Home meds:  lipitor 10  Now on lipitor 80  LDL 102, goal < 70  Continue statin at discharge  Diabetes type II Uncontrolled  HgbA1c 7.7, goal < 7.0  CBGs  SSI  Close PCP follow-up for better DM control  Other Stroke Risk Factors  Obesity, Body mass index is 35.05 kg/m., BMI >/= 30 associated with increased stroke risk, recommend weight loss, diet and exercise as appropriate   NICM w/ acute on chronic combined systolic and diastolic Congestive heart failure  Other Active Problems  Sinus tachycardia  Troponin elevation - cardiology on board  Mild hyponatremia Na 133->137 resolved  QT prolongation  Hospital day # 3 Recommend continue IV heparin and start warfarin with target INR goal between 2-2.5.  Continue ongoing therapy consults.  Speech therapy for language.  Discussed with Dr. Mliss Fritz.  Greater than 50% time during the 35-minute visit was spent on counseling and coordination of care about his embolic stroke and need for anticoagulation and answering questions.  Stroke team will sign off.  Kindly call for questions. Delia Heady, MD To contact Stroke Continuity provider,  please refer to [email or web address]. After hours, contact General Neurology

## 2020-07-22 NOTE — Progress Notes (Signed)
PROGRESS NOTE                                                                             PROGRESS NOTE                                                                                                                                                                                                             Patient Demographics:    Rodney Kane, is a 45 y.o. male, DOB - Nov 21, 1975, ZOX:[id number]  Outpatient Primary MD for the patient is Kallie Locks, FNP    LOS - 3  Admit date - 07/19/2020    Chief Complaint  Patient presents with  . Altered Mental Status       Brief Narrative     HPI: Rodney Kane is a 45 y.o. 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  Family reported to EMS that patient has had difficulty speaking for the last several days.  Unable to obtain any history from patient at this time due to his expressive aphasia.  No family bedside. Of note, patient was seen in the ED several days ago for chest pain and shortness of breath.  He had an unremarkable work-up at that time including CT of his chest showing no PE.  ED Course: Febrile with temperature 102.2 F.  Tachycardic with heart rate in the 140s.  Not hypotensive.  Not tachypneic or hypoxic.  SARS-CoV-2 PCR test positive.  Influenza panel negative.  WBC 6.5, hemoglobin 14.1, hematocrit 44.2, platelet count 242K.  Sodium 130, potassium 3.8, chloride 94, bicarb 21, BUN 15, creatinine 1.1, glucose 182.  LFTs normal.  Lipase normal.  Lactic acid 2.2 >2.6.  INR 1.2.  TSH normal.  Free T4 borderline elevated at 1.26.  Magnesium within normal range.  RPR pending.  Ammonia level <9.  Blood ethanol level undetectable.  UA  and urine culture pending.  UDS pending.  Blood culture x2 pending.  Initial high-sensitivity troponin 45, repeat pending.  BNP significantly elevated at 1619.  VBG with pH 7.48.  Triglycerides within normal range.  Ferritin, fibrinogen, LDH, CRP, D-dimer, procalcitonin pending.  CT head showing acute/subacute infarct in the left posterior parietal lobe.  Mild mass-effect without midline shift.  No acute intracranial hemorrhage.  ED provider discussed the case with Dr. Wilford Corner from neurology who recommended admission for stroke work-up with MRI and CTA of head and neck. CTA head and neck negative for LVO.  Brain MRI pending.  Chest x-ray showing cardiomegaly and pulmonary vascular congestion.  Given persistent tachycardia there is question for a flutter versus A. fib.  However, ED provider spoke to Dr. Mayford Knife who reviewed the patient's EKG and felt that it was showing sinus tachycardia.  Patient received Tylenol, ceftriaxone, and 1 L LR bolus initially for sepsis.  He was later given a dose of IV Lasix 40 mg for volume overload.    Subjective:    Rodney Kane today himself denies any complaints, no significant events overnight as discussed with staff .   Assessment  & Plan :    Principal Problem:   COVID-19 virus infection Active Problems:   CHF exacerbation (HCC)   Severe sepsis (HCC)   Sinus tachycardia   Acute CVA (cerebrovascular accident) (HCC)     SpO2: 98 %  Recent Labs  Lab 07/16/20 1634 07/16/20 2229 07/19/20 1831 07/19/20 1853 07/19/20 1907 07/19/20 2053 07/19/20 2107 07/20/20 0416 07/21/20 0512 07/22/20 0208  WBC 4.8  --   --  6.5  --   --   --   --  5.3 7.2  PLT 281  --   --  242  --   --   --   --  263 300  CRP  --   --   --   --   --   --  12.4*  --  12.5* 7.6*  BNP  --   --   --   --  1,610.9*  --   --   --   --   --   DDIMER  --  0.58*  --   --   --   --  1.27*  --  1.43* 0.71*  PROCALCITON  --   --   --   --   --   --  0.68  --   --   --   AST  --    --   --  22  --   --   --   --  21 31  ALT  --   --   --  21  --   --   --   --  18 23  ALKPHOS  --   --   --  48  --   --   --   --  36* 56  BILITOT  --   --   --  1.0  --   --   --   --  0.9 0.4  ALBUMIN  --   --   --  3.6  --   --   --   --  3.0* 3.0*  INR  --   --   --  1.2  --   --   --   --   --   --   LATICACIDVEN  --   --   --  2.2*  --  2.6*  --  3.4*  --   --   SARSCOV2NAA  --  NEGATIVE POSITIVE*  --   --   --   --   --   --   --        ABG     Component Value Date/Time   PHART 7.458 (H) 01/05/2017 0817   PCO2ART 39.9 01/05/2017 0817   PO2ART 70.0 (L) 01/05/2017 0817   HCO3 25.6 07/19/2020 1921   TCO2 27 07/19/2020 1921   O2SAT 54.0 07/19/2020 1921     Acute CVA:  - Patient with  expressive aphasia for several days and staggering gait. -MRI brain cannot for large acute infarct in the posterior left hemisphere with petechial hemorrhage and cytotoxic edema, but no malignant hemorrhagic transformation or significant intracranial mass effect, solitary small acute cortical infarcts superimposed in the superior right parietal lobe, and chronic microhemorrhages . -  CT head showing acute/subacute infarct in the left posterior parietal lobe.  Mild mass-effect without midline shift.  No acute intracranial hemorrhage.  CTA head and neck negative for LVO. -Significant for low EF 20 to 25%, and apical thrombus, most likely the cause of his acute CVA, started on heparin drip. -Hemoglobin A1c 7.7, LDL 102 -Atorvastatin 80 mg daily -PT, OT, speech therapy    COVID-19 viral infection: -Patient is unvaccinated. -No pneumonia on imaging, no hypoxia.he is very likely early in his COVID 19 infection to have any symptoms's. - started empirically on remdesivir and steroids at this point. -Continue to trend inflammatory markers -Procalcitonin of 0.68 will narrow from vancomycin and Zosyn to Rocephin and doxycycline.   Acute on chronic combined systolic and diastolic CHF/nonischemic  cardiomyopathy with apical thrombus: -Echo in 2018 with known EF 30 to 35%. -Repeat 2D echo this admission with low EF 20 to 25%, and apical thrombus, low EF and thrombus most likely in the setting of known chronic CHF, unlikely Covid at this point contributing given patient is still early in his Covid disease, with no evidence of pneumonia or significant coagulopathy yet, so low EF most likely progression of his known nonischemic cardiomyopathy with noncompliance with medications, and apical thrombus are related to that and not due to Covid. -Cardiology input greatly appreciated, on Coreg, will need Entresto, spironolactone when more stable , on heparin GTT, will be started on warfarin today while on heparin bridge, I have discussed with daughter, she and shortness patient this time will be compliant with medications and follow-up as instructed.  Mild troponin elevation:  -Most likely in the setting of acute CVA, and atrial thrombus .  Mild hyponatremia:  -L solved with hydration  Hypertension -Resumed home Coreg given persistent tachycardia.  Hold any other antihypertensives at this time to allow permissive hypertension in the setting of stroke which is possibly acute.  Hyperlipidemia -Continue statin, dose increased to full intensity statin given stroke  Hypokalemia - repleted  Insulin-dependent type 2 diabetes:  - No recent A1c results available for review.  Per med rec, patient is noncompliant with home meds.  He has not taken any medications for the past month. -Check A1c.  Sliding-scale insulin sensitive ACHS.   QT prolongation on EKG -Cardiac monitoring -Keep potassium above 4 and magnesium above 2 -Repeat EKG in a.m. -Avoid QT prolonging drugs if possible.  Medications noncompliance -Will be counseled  Condition - Extremely Guarded  Family Communication  : D/W daughter by phone 07/21/2020, 07/22/2020  Code Status :  Full  Consults  :  Neurology,  cardiology  Procedures  :  none  Disposition Plan  :    Status is: Inpatient  Remains inpatient appropriate because:IV treatments appropriate due to intensity of illness or inability to take PO   Dispo: The patient is from: Home              Anticipated d/c is to: SNF              Anticipated d/c date is: > 3 days              Patient currently is not medically stable to d/c.      DVT Prophylaxis  :  Heparin GTT  Lab Results  Component Value Date   PLT 300 07/22/2020    Diet :  Diet Order            Diet Carb Modified Fluid consistency: Thin; Room service appropriate? Yes  Diet effective now                  Inpatient Medications  Scheduled Meds: . vitamin C  500 mg Oral Daily  . atorvastatin  80 mg Oral Daily  . carvedilol  12.5 mg Oral BID WC  . cholecalciferol  1,000 Units Oral Daily  . dexamethasone (DECADRON) injection  6 mg Intravenous Daily  . doxycycline  100 mg Oral Q12H  . insulin aspart  0-5 Units Subcutaneous QHS  . insulin aspart  0-9 Units Subcutaneous TID WC  . insulin glargine  5 Units Subcutaneous Daily  . linagliptin  5 mg Oral Daily  . zinc sulfate  220 mg Oral Daily   Continuous Infusions: . cefTRIAXone (ROCEPHIN)  IV Stopped (07/21/20 1445)  . heparin 1,000 Units/hr (07/22/20 0924)  . remdesivir 100 mg in NS 100 mL Stopped (07/22/20 0915)   PRN Meds:.acetaminophen  Antibiotics  :    Anti-infectives (From admission, onward)   Start     Dose/Rate Route Frequency Ordered Stop   07/21/20 1000  remdesivir 100 mg in sodium chloride 0.9 % 100 mL IVPB       "Followed by" Linked Group Details   100 mg 200 mL/hr over 30 Minutes Intravenous Daily 07/20/20 0055 07/25/20 0959   07/20/20 1800  vancomycin (VANCOREADY) IVPB 1500 mg/300 mL  Status:  Discontinued        1,500 mg 150 mL/hr over 120 Minutes Intravenous Every 12 hours 07/20/20 0628 07/20/20 1425   07/20/20 1500  cefTRIAXone (ROCEPHIN) 1 g in sodium chloride 0.9 % 100 mL IVPB         1 g 200 mL/hr over 30 Minutes Intravenous Every 24 hours 07/20/20 1425     07/20/20 1430  doxycycline (VIBRA-TABS) tablet 100 mg        100 mg Oral Every 12 hours 07/20/20 1425     07/20/20 0700  vancomycin (VANCOREADY) IVPB 2000 mg/400 mL        2,000 mg 200 mL/hr over 120 Minutes Intravenous  Once 07/20/20 0628 07/20/20 1355   07/20/20 0630  piperacillin-tazobactam (ZOSYN) IVPB 3.375 g  Status:  Discontinued        3.375 g 12.5 mL/hr over 240 Minutes Intravenous Every 8 hours 07/20/20 0628 07/20/20 1425   07/20/20 0200  remdesivir 200 mg in sodium chloride 0.9% 250 mL IVPB       "Followed by" Linked Group Details   200 mg 580 mL/hr over 30 Minutes Intravenous Once 07/20/20 0055 07/20/20 0238   07/19/20 1900  cefTRIAXone (ROCEPHIN) 2  g in sodium chloride 0.9 % 100 mL IVPB        2 g 200 mL/hr over 30 Minutes Intravenous  Once 07/19/20 1856 07/19/20 2040        Dawood Elgergawy M.D on 07/22/2020 at 2:02 PM  To page go to [email or web address]   [facility] -  Office  [phone]      Objective:   Vitals:   07/21/20 1610 07/21/20 1959 07/22/20 0455 07/22/20 0830  BP: (!) 133/100 107/77 122/83   Pulse: 89 91 93   Resp: 18 20 16    Temp: 98.6 F (37 C) 98.6 F (37 C) 98.3 F (36.8 C)   TempSrc: Oral Oral Oral   SpO2: 99% 100% 98% 98%  Weight:      Height:        Wt Readings from Last 3 Encounters:  07/21/20 120.5 kg  07/16/20 119 kg  02/23/20 119.7 kg     Intake/Output Summary (Last 24 hours) at 07/22/2020 1402 Last data filed at 07/22/2020 0924 Gross per 24 hour  Intake 971.58 ml  Output -  Net 971.58 ml     Physical Exam  Awake Alert, Oriented X 3, No new F.N deficits, Normal affect, patient was receptive and expressive aphasia Symmetrical Chest wall movement, Good air movement bilaterally, CTAB RRR,No Gallops,Rubs or new Murmurs, No Parasternal Heave +ve B.Sounds, Abd Soft, No tenderness, No rebound - guarding or rigidity. No Cyanosis, Clubbing or  edema, No new Rash or bruise        Data Review:    CBC Recent Labs  Lab 07/16/20 1634 07/19/20 1853 07/19/20 1921 07/21/20 0512 07/22/20 0208  WBC 4.8 6.5  --  5.3 7.2  HGB 13.2 14.1 15.3 13.5 13.5  HCT 41.2 44.2 45.0 41.3 40.5  PLT 281 242  --  263 300  MCV 81.7 79.2*  --  77.3* 77.9*  MCH 26.2 25.3*  --  25.3* 26.0  MCHC 32.0 31.9  --  32.7 33.3  RDW 13.4 12.8  --  13.0 13.2  LYMPHSABS  --  0.8  --  1.7 1.7  MONOABS  --  0.7  --  0.6 0.7  EOSABS  --  0.0  --  0.0 0.0  BASOSABS  --  0.0  --  0.0 0.0    Recent Labs  Lab 07/16/20 1634 07/16/20 2229 07/19/20 1853 07/19/20 1855 07/19/20 1856 07/19/20 1907 07/19/20 1921 07/19/20 2053 07/19/20 2107 07/20/20 0416 07/21/20 0512 07/22/20 0208  NA 141  --  130*  --   --   --  133*  --   --   --  134* 137  K 4.0  --  3.8  --   --   --  3.9  --   --   --  3.1* 4.3  CL 105  --  94*  --   --   --   --   --   --   --  97* 102  CO2 27  --  21*  --   --   --   --   --   --   --  25 23  GLUCOSE 141*  --  182*  --   --   --   --   --   --   --  186* 244*  BUN 14  --  15  --   --   --   --   --   --   --  22*  27*  CREATININE 1.02  --  1.19  --   --   --   --   --   --   --  1.15 1.11  CALCIUM 9.1  --  9.0  --   --   --   --   --   --   --  8.3* 8.9  AST  --   --  22  --   --   --   --   --   --   --  21 31  ALT  --   --  21  --   --   --   --   --   --   --  18 23  ALKPHOS  --   --  48  --   --   --   --   --   --   --  36* 56  BILITOT  --   --  1.0  --   --   --   --   --   --   --  0.9 0.4  ALBUMIN  --   --  3.6  --   --   --   --   --   --   --  3.0* 3.0*  MG  --   --  1.8  --   --   --   --   --   --   --   --  2.0  CRP  --   --   --   --   --   --   --   --  12.4*  --  12.5* 7.6*  DDIMER  --  0.58*  --   --   --   --   --   --  1.27*  --  1.43* 0.71*  PROCALCITON  --   --   --   --   --   --   --   --  0.68  --   --   --   LATICACIDVEN  --   --  2.2*  --   --   --   --  2.6*  --  3.4*  --   --   INR  --   --  1.2  --    --   --   --   --   --   --   --   --   TSH  --   --   --  1.447  --   --   --   --   --   --   --   --   HGBA1C  --   --   --   --   --   --   --   --   --  7.7*  --   --   AMMONIA  --   --   --   --  <9*  --   --   --   --   --   --   --   BNP  --   --   --   --   --  1,619.5*  --   --   --   --   --   --     ------------------------------------------------------------------------------------------------------------------ Recent Labs    07/19/20 2103 07/20/20 0416  CHOL  --  145  HDL  --  31*  LDLCALC  --  102*  TRIG 54  62  CHOLHDL  --  4.7    Lab Results  Component Value Date   HGBA1C 7.7 (H) 07/20/2020   ------------------------------------------------------------------------------------------------------------------ Recent Labs    07/19/20 1855  TSH 1.447    Cardiac Enzymes No results for input(s): CKMB, TROPONINI, MYOGLOBIN in the last 168 hours.  Invalid input(s): CK ------------------------------------------------------------------------------------------------------------------    Component Value Date/Time   BNP 1,619.5 (H) 07/19/2020 1907    Micro Results Recent Results (from the past 240 hour(s))  Resp Panel by RT-PCR (Flu A&B, Covid) Nasopharyngeal Swab     Status: None   Collection Time: 07/16/20 10:29 PM   Specimen: Nasopharyngeal Swab; Nasopharyngeal(NP) swabs in vial transport medium  Result Value Ref Range Status   SARS Coronavirus 2 by RT PCR NEGATIVE NEGATIVE Final    Comment: (NOTE) SARS-CoV-2 target nucleic acids are NOT DETECTED.  The SARS-CoV-2 RNA is generally detectable in upper respiratory specimens during the acute phase of infection. The lowest concentration of SARS-CoV-2 viral copies this assay can detect is 138 copies/mL. A negative result does not preclude SARS-Cov-2 infection and should not be used as the sole basis for treatment or other patient management decisions. A negative result may occur with  improper specimen  collection/handling, submission of specimen other than nasopharyngeal swab, presence of viral mutation(s) within the areas targeted by this assay, and inadequate number of viral copies(<138 copies/mL). A negative result must be combined with clinical observations, patient history, and epidemiological information. The expected result is Negative.  Fact Sheet for Patients:  BloggerCourse.com  Fact Sheet for Healthcare Providers:  SeriousBroker.it  This test is no t yet approved or cleared by the [place] FDA and  has been authorized for detection and/or diagnosis of SARS-CoV-2 by FDA under an Emergency Use Authorization (EUA). This EUA will remain  in effect (meaning this test can be used) for the duration of the COVID-19 declaration under Section 564(b)(1) of the Act, 21 U.S.C.section 360bbb-3(b)(1), unless the authorization is terminated  or revoked sooner.       Influenza A by PCR NEGATIVE NEGATIVE Final   Influenza B by PCR NEGATIVE NEGATIVE Final    Comment: (NOTE) The Xpert Xpress SARS-CoV-2/FLU/RSV plus assay is intended as an aid in the diagnosis of influenza from Nasopharyngeal swab specimens and should not be used as a sole basis for treatment. Nasal washings and aspirates are unacceptable for Xpert Xpress SARS-CoV-2/FLU/RSV testing.  Fact Sheet for Patients: BloggerCourse.com  Fact Sheet for Healthcare Providers: SeriousBroker.it  This test is not yet approved or cleared by the [place] FDA and has been authorized for detection and/or diagnosis of SARS-CoV-2 by FDA under an Emergency Use Authorization (EUA). This EUA will remain in effect (meaning this test can be used) for the duration of the COVID-19 declaration under Section 564(b)(1) of the Act, 21 U.S.C. section 360bbb-3(b)(1), unless the authorization is terminated or revoked.  Performed at [facility], [address] [place], [place] [address]   Resp Panel by RT-PCR (Flu A&B, Covid) Nasopharyngeal Swab     Status: Abnormal   Collection Time: 07/19/20  6:31 PM   Specimen: Nasopharyngeal Swab; Nasopharyngeal(NP) swabs in vial transport medium  Result Value Ref Range Status   SARS Coronavirus 2 by RT PCR POSITIVE (A) NEGATIVE Final    Comment: RESULT CALLED TO, READ BACK BY AND VERIFIED WITH: Rema Jasmine, RN 07/19/20 at 1947 sk  (NOTE) SARS-CoV-2 target nucleic acids are DETECTED.  The SARS-CoV-2 RNA is generally detectable in upper respiratory specimens  during the acute phase of infection. Positive results are indicative of the presence of the identified virus, but do not rule out bacterial infection or co-infection with other pathogens not detected by the test. Clinical correlation with patient history and other diagnostic information is necessary to determine patient infection status. The expected result is Negative.  Fact Sheet for Patients: BloggerCourse.com  Fact Sheet for Healthcare Providers: SeriousBroker.it  This test is not yet approved or cleared by the [place] FDA and  has been authorized for detection and/or diagnosis of SARS-CoV-2 by FDA under an Emergency Use Authorization (EUA).  This EUA will remain in effect (meaning this test can  be used) for the duration of  the COVID-19 declaration under Section 564(b)(1) of the Act, 21 U.S.C. section 360bbb-3(b)(1), unless the authorization is terminated or revoked sooner.     Influenza A by PCR NEGATIVE NEGATIVE Final   Influenza B by PCR NEGATIVE NEGATIVE Final    Comment: (NOTE) The Xpert Xpress SARS-CoV-2/FLU/RSV plus assay is intended as an aid in the diagnosis of influenza from Nasopharyngeal swab specimens and should not be used as a sole basis for treatment. Nasal washings and aspirates are unacceptable for Xpert Xpress  SARS-CoV-2/FLU/RSV testing.  Fact Sheet for Patients: BloggerCourse.com  Fact Sheet for Healthcare Providers: SeriousBroker.it  This test is not yet approved or cleared by the [place] FDA and has been authorized for detection and/or diagnosis of SARS-CoV-2 by FDA under an Emergency Use Authorization (EUA). This EUA will remain in effect (meaning this test can be used) for the duration of the COVID-19 declaration under Section 564(b)(1) of the Act, 21 U.S.C. section 360bbb-3(b)(1), unless the authorization is terminated or revoked.  Performed at [facility], [address], [place], [place] [address]   Blood Culture (routine x 2)     Status: None (Preliminary result)   Collection Time: 07/19/20  6:58 PM   Specimen: BLOOD  Result Value Ref Range Status   Specimen Description BLOOD LEFT ANTECUBITAL  Final   Special Requests   Final    BOTTLES DRAWN AEROBIC AND ANAEROBIC Blood Culture adequate volume   Culture   Final    NO GROWTH 3 DAYS Performed at [facility], [address], [place], [place] [address]    Report Status PENDING  Incomplete    Radiology Reports CT Angio Head W or Wo Contrast  Result Date: 07/19/2020 CLINICAL DATA:  Stroke-like symptoms. Expressive aphasia, headache, confusion EXAM: CT ANGIOGRAPHY HEAD AND NECK TECHNIQUE: Multidetector CT imaging of the head and neck was performed using the standard protocol during bolus administration of intravenous contrast. Multiplanar CT image reconstructions and MIPs were obtained to evaluate the vascular anatomy. Carotid stenosis measurements (when applicable) are obtained utilizing NASCET criteria, using the distal internal carotid diameter as the denominator. CONTRAST:  71mL OMNIPAQUE IOHEXOL 350 MG/ML SOLN COMPARISON:  CT head 07/19/2020 FINDINGS: CTA NECK FINDINGS Aortic arch: Normal aortic arch and proximal great vessels. Bovine branching pattern. Right  carotid system: Normal right carotid. Negative for stenosis or dissection. Left carotid system: Normal left carotid. Negative for stenosis or dissection. Vertebral arteries: Both vertebral arteries are normal and patent to the basilar. Right vertebral artery dominant. Skeleton: No acute skeletal abnormality. Other neck: Negative for mass or adenopathy in the neck. Upper chest: Small bilateral effusions. Patchy nodular airspace disease left upper lobe, not present on CT chest 07/17/2020. Possible pneumonia or edema. Review of the MIP images confirms the above findings CTA HEAD FINDINGS  Anterior circulation: Normal cavernous carotid. Right anterior and middle cerebral arteries widely patent. Left anterior cerebral artery patent. Left M1 segment widely patent. Left MCA bifurcation normal. No large vessel occlusion. There is an area of hypoperfusion in the left parietal lobe corresponding to the hypodensity. This is consistent with recent infarct with distal MCA occlusion Posterior circulation: Both vertebral arteries patent to the basilar. PICA patent bilaterally. Basilar widely patent. Posterior circulation normal without stenosis or large vessel occlusion. Venous sinuses: Normal venous enhancement. Anatomic variants: None Review of the MIP images confirms the above findings IMPRESSION: 1. Normal carotid and vertebral arteries in the neck 2. No intracranial large vessel occlusion. There is a small area of hypoperfusion in the left parietal lobe consistent with subacute infarct. This area shows hypodensity on CT. Electronically Signed   By: Marlan Palau M.D.   On: 07/19/2020 20:58   DG Abd 1 View  Result Date: 07/20/2020 CLINICAL DATA:  Pre MRI EXAM: ABDOMEN - 1 VIEW COMPARISON:  CT 02/24/2020 FINDINGS: No implantable device seen. Patient's metallic belt buckle and zipper noted. Nonobstructive bowel gas pattern. No organomegaly or free air. IMPRESSION: No visible implantable device. Electronically Signed   By:  Charlett Nose M.D.   On: 07/20/2020 07:35   CT Head Wo Contrast  Result Date: 07/19/2020 CLINICAL DATA:  Altered mental status. Stroke-like symptoms for 4 days. Expressive aphasia, headache, and confusion. EXAM: CT HEAD WITHOUT CONTRAST TECHNIQUE: Contiguous axial images were obtained from the base of the skull through the vertex without intravenous contrast. COMPARISON:  None. FINDINGS: Brain: Poorly defined area of low attenuation change extending to the cortical surface and involving the left posterior parietal lobe. This is consistent with acute or subacute infarct. Mild mass effect with effacement of sulci and left lateral ventricle. No midline shift. No abnormal extra-axial fluid collections. No acute intracranial hemorrhage. No ventricular dilatation. Vascular: Mild intracranial arterial vascular calcifications. Skull: Calvarium appears intact. Sinuses/Orbits: Paranasal sinuses and mastoid air cells are clear. Other: None. IMPRESSION: Low-attenuation in the left posterior parietal lobe consistent with acute or subacute infarct. Mild mass effect without midline shift. No acute intracranial hemorrhage. Electronically Signed   By: Burman Nieves M.D.   On: 07/19/2020 19:36   CT Angio Neck W and/or Wo Contrast  Result Date: 07/19/2020 CLINICAL DATA:  Stroke-like symptoms. Expressive aphasia, headache, confusion EXAM: CT ANGIOGRAPHY HEAD AND NECK TECHNIQUE: Multidetector CT imaging of the head and neck was performed using the standard protocol during bolus administration of intravenous contrast. Multiplanar CT image reconstructions and MIPs were obtained to evaluate the vascular anatomy. Carotid stenosis measurements (when applicable) are obtained utilizing NASCET criteria, using the distal internal carotid diameter as the denominator. CONTRAST:  54mL OMNIPAQUE IOHEXOL 350 MG/ML SOLN COMPARISON:  CT head 07/19/2020 FINDINGS: CTA NECK FINDINGS Aortic arch: Normal aortic arch and proximal great vessels.  Bovine branching pattern. Right carotid system: Normal right carotid. Negative for stenosis or dissection. Left carotid system: Normal left carotid. Negative for stenosis or dissection. Vertebral arteries: Both vertebral arteries are normal and patent to the basilar. Right vertebral artery dominant. Skeleton: No acute skeletal abnormality. Other neck: Negative for mass or adenopathy in the neck. Upper chest: Small bilateral effusions. Patchy nodular airspace disease left upper lobe, not present on CT chest 07/17/2020. Possible pneumonia or edema. Review of the MIP images confirms the above findings CTA HEAD FINDINGS Anterior circulation: Normal cavernous carotid. Right anterior and middle cerebral arteries widely patent. Left anterior cerebral artery patent. Left M1 segment widely  patent. Left MCA bifurcation normal. No large vessel occlusion. There is an area of hypoperfusion in the left parietal lobe corresponding to the hypodensity. This is consistent with recent infarct with distal MCA occlusion Posterior circulation: Both vertebral arteries patent to the basilar. PICA patent bilaterally. Basilar widely patent. Posterior circulation normal without stenosis or large vessel occlusion. Venous sinuses: Normal venous enhancement. Anatomic variants: None Review of the MIP images confirms the above findings IMPRESSION: 1. Normal carotid and vertebral arteries in the neck 2. No intracranial large vessel occlusion. There is a small area of hypoperfusion in the left parietal lobe consistent with subacute infarct. This area shows hypodensity on CT. Electronically Signed   By: Franchot Gallo M.D.   On: 07/19/2020 20:58   CT Angio Chest PE W and/or Wo Contrast  Result Date: 07/17/2020 CLINICAL DATA:  Dyspnea, left chest pain, productive cough EXAM: CT ANGIOGRAPHY CHEST WITH CONTRAST TECHNIQUE: Multidetector CT imaging of the chest was performed using the standard protocol during bolus administration of intravenous  contrast. Multiplanar CT image reconstructions and MIPs were obtained to evaluate the vascular anatomy. CONTRAST:  54mL OMNIPAQUE IOHEXOL 350 MG/ML SOLN COMPARISON:  None FINDINGS: Cardiovascular: There is adequate opacification of the pulmonary arterial tree. There is no intraluminal filling defect identified to suggest acute pulmonary embolism. Central pulmonary arteries are of normal caliber. No significant coronary artery calcification. Global cardiac size is within normal limits. Mild left ventricular dilation, however, is noted. No pericardial effusion. The thoracic aorta is unremarkable. Mediastinum/Nodes: No enlarged mediastinal, hilar, or axillary lymph nodes. Thyroid gland, trachea, and esophagus demonstrate no significant findings. Lungs/Pleura: Small right pleural effusion is present with minimal associated right basilar atelectasis. The lungs are otherwise clear. No pneumothorax. No central obstructing lesion. Upper Abdomen: No acute abnormality. Musculoskeletal: Osseous structures are age-appropriate. Gynecomastia noted. Review of the MIP images confirms the above findings. IMPRESSION: No pulmonary embolism. Mild left ventricular dilation. This could be better assessed with echocardiography. Small right pleural effusion. Electronically Signed   By: Fidela Salisbury MD   On: 07/17/2020 01:55   MR Brain Wo Contrast (neuro protocol)  Result Date: 07/20/2020 CLINICAL DATA:  45 year old male with stroke-like symptoms for 4 days. 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 COVID-19. EXAM: MRI HEAD WITHOUT CONTRAST TECHNIQUE: Multiplanar, multiecho pulse sequences of the brain and surrounding structures were obtained without intravenous contrast. COMPARISON:  CT head, CTA head and neck yesterday. FINDINGS: Brain: Confluent restricted diffusion from the posterior temporal lobe through the lateral left occipital lobe tracking to the left occipital pole, an  area of 9.5 cm (series 2, image 29) corresponding to the CT finding yesterday. Multifocal petechial hemorrhage (series 7, image 61) and confluent cytotoxic edema but no malignant hemorrhagic transformation and only mild mass effect on the left lateral ventricle without midline shift. Superimposed small cortical restricted diffusion at the right superior parietal lobe (series 2, image 44). Mild if any associated T2 and FLAIR hyperintensity. No hemorrhage. No other restricted diffusion. No midline shift, evidence of mass lesion, ventriculomegaly, extra-axial collection. Patchy and nodular superimposed bilateral cerebral white matter T2 and FLAIR hyperintensity in a nonspecific configuration. The corpus callosum and temporal lobes are spared. No chronic cortical encephalomalacia. But there do appear to be several chronic microhemorrhages in the left lentiform nuclei (series 7, images 52 and 56). No other chronic cerebral blood products. Elsewhere the deep gray nuclei, brainstem and cerebellum are within normal limits. Cervicomedullary junction and pituitary are within  normal limits. Vascular: Major intracranial vascular flow voids are preserved. Skull and upper cervical spine: Negative. Sinuses/Orbits: Disconjugate gaze otherwise negative orbits. Paranasal sinuses and mastoids are stable and well pneumatized. Other: None. IMPRESSION: 1. Large acute infarct in the posterior left hemisphere with petechial hemorrhage and cytotoxic edema, but no malignant hemorrhagic transformation or significant intracranial mass effect. 2. Solitary small acute cortical infarct superimposed in the superior right parietal lobe. 3. Chronic micro-hemorrhages in the left lentiform nuclei. Bilateral periventricular white matter signal changes which may therefore be chronic small vessel disease related. Electronically Signed   By: Odessa Fleming M.D.   On: 07/20/2020 12:00   DG Chest Portable 1 View  Result Date: 07/19/2020 CLINICAL DATA:  Fever,  altered level of consciousness EXAM: PORTABLE CHEST 1 VIEW COMPARISON:  07/17/2020 FINDINGS: Single frontal view of the chest demonstrates an enlarged cardiac silhouette. Increased central vascular congestion, with interval development of bilateral interstitial prominence. No large effusion or pneumothorax. No acute bony abnormalities. IMPRESSION: 1. Worsening volume status, with mild interstitial edema on today's exam. Electronically Signed   By: Sharlet Salina M.D.   On: 07/19/2020 18:49   DG Chest Port 1 View  Result Date: 07/16/2020 CLINICAL DATA:  Intermittent sharp left-sided chest pain, short of breath, productive cough EXAM: PORTABLE CHEST 1 VIEW COMPARISON:  05/29/2018 FINDINGS: The heart size and mediastinal contours are within normal limits. Both lungs are clear. The visualized skeletal structures are unremarkable. IMPRESSION: No active disease. Electronically Signed   By: Sharlet Salina M.D.   On: 07/16/2020 17:21   ECHOCARDIOGRAM COMPLETE  Result Date: 07/20/2020    ECHOCARDIOGRAM REPORT   Patient Name:   ODELL CHOUNG Date of Exam: 07/20/2020 Medical Rec #:  [id number]     Height:       73.0 in Accession #:    [id number]    Weight:       262.3 lb Date of Birth:  1975/11/06     BSA:          2.415 m Patient Age:    44 years      BP:           153/106 mmHg Patient Gender: M             HR:           108 bpm. Exam Location:  Inpatient Procedure: 2D Echo, Color Doppler, Cardiac Doppler and Intracardiac            Opacification Agent Indications:    CHF-Acute Systolic I50.21  History:        Patient has prior history of Echocardiogram examinations, most                 recent 01/04/2017. Risk Factors:Hypertension, Dyslipidemia and                 Diabetes.  Sonographer:    Eulah Pont RDCS Referring Phys: [id number] VASUNDHRA RATHORE IMPRESSIONS  1. A small (8 mm diameter), almost spherical, slightly mobile ventricular apical thrombus is seen. Left ventricular ejection fraction, by estimation, is 20  to 25%. The left ventricle has severely decreased function. The left ventricle demonstrates global hypokinesis. The left ventricular internal cavity size was moderately dilated. Indeterminate diastolic filling due to E-A fusion.  2. Right ventricular systolic function is mildly reduced. The right ventricular size is normal. Tricuspid regurgitation signal is inadequate for assessing PA pressure.  3. Left atrial size was moderately dilated.  4. A small pericardial effusion is present.  The pericardial effusion is localized near the right atrium.  5. The mitral valve is normal in structure. No evidence of mitral valve regurgitation. No evidence of mitral stenosis.  6. The aortic valve is tricuspid. Aortic valve regurgitation is not visualized. No aortic stenosis is present.  7. The inferior vena cava is normal in size with greater than 50% respiratory variability, suggesting right atrial pressure of 3 mmHg. Comparison(s): Prior images unable to be directly viewed, comparison made by report only. The left ventricular function is worsened. A new apical LV thrombus is seen. FINDINGS  Left Ventricle: A small (8 mm diameter), almost spherical, slightly mobile ventricular apical thrombus is seen. Left ventricular ejection fraction, by estimation, is 20 to 25%. The left ventricle has severely decreased function. The left ventricle demonstrates global hypokinesis. Definity contrast agent was given IV to delineate the left ventricular endocardial borders. The left ventricular internal cavity size was moderately dilated. There is no left ventricular hypertrophy. Indeterminate diastolic filling due to E-A fusion. Right Ventricle: The right ventricular size is normal. No increase in right ventricular wall thickness. Right ventricular systolic function is mildly reduced. Tricuspid regurgitation signal is inadequate for assessing PA pressure. Left Atrium: Left atrial size was moderately dilated. Right Atrium: Right atrial size was  normal in size. Pericardium: A small pericardial effusion is present. The pericardial effusion is localized near the right atrium. Mitral Valve: The mitral valve is normal in structure. No evidence of mitral valve regurgitation. No evidence of mitral valve stenosis. Tricuspid Valve: The tricuspid valve is normal in structure. Tricuspid valve regurgitation is not demonstrated. Aortic Valve: The aortic valve is tricuspid. Aortic valve regurgitation is not visualized. No aortic stenosis is present. Pulmonic Valve: The pulmonic valve was grossly normal. Pulmonic valve regurgitation is not visualized. Aorta: The aortic root and ascending aorta are structurally normal, with no evidence of dilitation. Venous: The inferior vena cava is normal in size with greater than 50% respiratory variability, suggesting right atrial pressure of 3 mmHg. IAS/Shunts: No atrial level shunt detected by color flow Doppler.  LEFT VENTRICLE PLAX 2D LVIDd:         6.60 cm LVIDs:         5.40 cm LV PW:         1.10 cm LV IVS:        0.90 cm LVOT diam:     2.10 cm LV SV:         37 LV SV Index:   16 LVOT Area:     3.46 cm  RIGHT VENTRICLE RV S prime:     7.89 cm/s TAPSE (M-mode): 2.0 cm LEFT ATRIUM              Index       RIGHT ATRIUM           Index LA diam:        4.20 cm  1.74 cm/m  RA Area:     17.50 cm LA Vol (A2C):   98.6 ml  40.83 ml/m RA Volume:   46.80 ml  19.38 ml/m LA Vol (A4C):   101.0 ml 41.83 ml/m LA Biplane Vol: 103.0 ml 42.66 ml/m  AORTIC VALVE LVOT Vmax:   76.50 cm/s LVOT Vmean:  54.750 cm/s LVOT VTI:    0.108 m  AORTA Ao Root diam: 3.60 cm Ao Asc diam:  3.10 cm  SHUNTS Systemic VTI:  0.11 m Systemic Diam: 2.10 cm Rachelle Hora Croitoru MD Electronically signed by Thurmon Fair MD Signature Date/Time:  07/20/2020/1:15:21 PM    Final

## 2020-07-23 ENCOUNTER — Encounter (HOSPITAL_COMMUNITY): Payer: Self-pay | Admitting: Internal Medicine

## 2020-07-23 ENCOUNTER — Other Ambulatory Visit: Payer: Self-pay

## 2020-07-23 DIAGNOSIS — I5020 Unspecified systolic (congestive) heart failure: Secondary | ICD-10-CM

## 2020-07-23 DIAGNOSIS — U071 COVID-19: Secondary | ICD-10-CM | POA: Diagnosis not present

## 2020-07-23 LAB — C-REACTIVE PROTEIN: CRP: 4.6 mg/dL — ABNORMAL HIGH (ref <1.0)

## 2020-07-23 LAB — CBC WITH DIFFERENTIAL/PLATELET
Abs Immature Granulocytes: 0.02 10*3/uL (ref 0.00–0.07)
Basophils Absolute: 0 10*3/uL (ref 0.0–0.1)
Basophils Relative: 0 %
Eosinophils Absolute: 0 10*3/uL (ref 0.0–0.5)
Eosinophils Relative: 0 %
HCT: 40.3 % (ref 39.0–52.0)
Hemoglobin: 13 g/dL (ref 13.0–17.0)
Immature Granulocytes: 0 %
Lymphocytes Relative: 18 %
Lymphs Abs: 1.6 10*3/uL (ref 0.7–4.0)
MCH: 25.1 pg — ABNORMAL LOW (ref 26.0–34.0)
MCHC: 32.3 g/dL (ref 30.0–36.0)
MCV: 77.9 fL — ABNORMAL LOW (ref 80.0–100.0)
Monocytes Absolute: 0.6 10*3/uL (ref 0.1–1.0)
Monocytes Relative: 7 %
Neutro Abs: 6.8 10*3/uL (ref 1.7–7.7)
Neutrophils Relative %: 75 %
Platelets: 302 10*3/uL (ref 150–400)
RBC: 5.17 MIL/uL (ref 4.22–5.81)
RDW: 13.1 % (ref 11.5–15.5)
WBC: 9.1 10*3/uL (ref 4.0–10.5)
nRBC: 0 % (ref 0.0–0.2)

## 2020-07-23 LAB — COMPREHENSIVE METABOLIC PANEL
ALT: 24 U/L (ref 0–44)
AST: 25 U/L (ref 15–41)
Albumin: 3 g/dL — ABNORMAL LOW (ref 3.5–5.0)
Alkaline Phosphatase: 53 U/L (ref 38–126)
Anion gap: 10 (ref 5–15)
BUN: 22 mg/dL — ABNORMAL HIGH (ref 6–20)
CO2: 22 mmol/L (ref 22–32)
Calcium: 8.7 mg/dL — ABNORMAL LOW (ref 8.9–10.3)
Chloride: 102 mmol/L (ref 98–111)
Creatinine, Ser: 1.06 mg/dL (ref 0.61–1.24)
GFR, Estimated: >60 mL/min (ref >60)
Glucose, Bld: 205 mg/dL — ABNORMAL HIGH (ref 70–99)
Potassium: 3.7 mmol/L (ref 3.5–5.1)
Sodium: 134 mmol/L — ABNORMAL LOW (ref 135–145)
Total Bilirubin: 0.7 mg/dL (ref 0.3–1.2)
Total Protein: 6.2 g/dL — ABNORMAL LOW (ref 6.5–8.1)

## 2020-07-23 LAB — GLUCOSE, CAPILLARY
Glucose-Capillary: 210 mg/dL — ABNORMAL HIGH (ref 70–99)
Glucose-Capillary: 262 mg/dL — ABNORMAL HIGH (ref 70–99)
Glucose-Capillary: 303 mg/dL — ABNORMAL HIGH (ref 70–99)
Glucose-Capillary: 209 mg/dL — ABNORMAL HIGH (ref 70–99)

## 2020-07-23 LAB — PROTIME-INR
INR: 1.2 (ref 0.8–1.2)
Prothrombin Time: 14.7 seconds (ref 11.4–15.2)

## 2020-07-23 LAB — HEPARIN LEVEL (UNFRACTIONATED)
Heparin Unfractionated: 0.3 IU/mL (ref 0.30–0.70)
Heparin Unfractionated: 0.22 IU/mL — ABNORMAL LOW (ref 0.30–0.70)
Heparin Unfractionated: 0.26 IU/mL — ABNORMAL LOW (ref 0.30–0.70)

## 2020-07-23 LAB — D-DIMER, QUANTITATIVE: D-Dimer, Quant: 0.48 ug/mL-FEU (ref 0.00–0.50)

## 2020-07-23 MED ORDER — GLIPIZIDE 5 MG PO TABS
10.0000 mg | ORAL_TABLET | Freq: Two times a day (BID) | ORAL | Status: DC
  Administered 2020-07-23 – 2020-07-28 (×10): 10 mg via ORAL
  Filled 2020-07-23 (×10): qty 2

## 2020-07-23 MED ORDER — WARFARIN SODIUM 7.5 MG PO TABS
7.5000 mg | ORAL_TABLET | Freq: Once | ORAL | Status: AC
  Administered 2020-07-23: 7.5 mg via ORAL
  Filled 2020-07-23: qty 1

## 2020-07-23 NOTE — Progress Notes (Signed)
Occupational Therapy Treatment Patient Details Name: Rodney Kane MRN: [id number] DOB: 06-06-1976 Today's Date: 07/23/2020    History of present illness 45 y.o. 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 CT head showing acute/subacute infarct in the left posterior parietal lobe. 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 Pt with improved RUE function today as demonstrated through fine motor tasks during ADL completion. He continues to present with aphasia and suspected cognitive impairments. VSS throughout on RA. Feel current POC remains appropriate at this time. Will continue to follow acutely to further address cognition and to maximize pt's overall safety and independence with ADL and mobility.    Follow Up Recommendations  Outpatient OT;Other (comment) (outpt neuro)    Equipment Recommendations  None recommended by OT          Precautions / Restrictions Precautions Precautions: None Restrictions Weight Bearing Restrictions: No       Mobility Bed Mobility Overal bed mobility: Needs Assistance Bed Mobility: Supine to Sit     Supine to sit: Supervision     General bed mobility comments: for lines and safety  Transfers Overall transfer level: Needs assistance Equipment used: None Transfers: Sit to/from Stand Sit to Stand: Supervision         General transfer  comment: for balance and safety, good power up with no physical assist required. stood from EOB and toilet    Balance Overall balance assessment: No apparent balance deficits (not formally assessed)                                         ADL either performed or assessed with clinical judgement   ADL Overall ADL's : Needs assistance/impaired     Grooming: Oral care;Wash/dry face;Supervision/safety;Standing                   Toilet Transfer: Supervision/safety;Ambulation;Regular Teacher, adult education Details (indicate cue type and reason): pushing IV pole Toileting- Clothing Manipulation and Hygiene: Supervision/safety;Sitting/lateral lean;Sit to/from stand Toileting - Clothing Manipulation Details (indicate cue type and reason): distant supervision     Functional mobility during ADLs: Supervision/safety (IV pole) General ADL Comments: pt with improved balance and RUE function today                       Cognition Arousal/Alertness: Awake/alert Behavior During Therapy: WFL for tasks assessed/performed Overall Cognitive Status: Impaired/Different from baseline Area of Impairment: Following commands;Problem solving;Memory                   Current Attention Level: Selective Memory: Decreased short-term memory Following Commands: Follows one step commands with increased time;Follows multi-step commands with increased time     Problem Solving: Requires verbal cues;Requires tactile cues;Slow processing          Exercises     Shoulder Instructions       General Comments VSS on RA    Pertinent Vitals/ Pain  Pain Assessment: No/denies pain  Home Living   Living Arrangements: Children                                      Prior Functioning/Environment              Frequency  Min 3X/week        Progress Toward Goals  OT Goals(current goals can now be found in the care plan section)  Progress  towards OT goals: Progressing toward goals  Acute Rehab OT Goals Patient Stated Goal: watch [employer] play OT Goal Formulation: With patient Time For Goal Achievement: 08/04/20 Potential to Achieve Goals: Good  Plan Discharge plan remains appropriate    Co-evaluation                 AM-PAC OT "6 Clicks" Daily Activity     Outcome Measure   Help from another person eating meals?: None Help from another person taking care of personal grooming?: None Help from another person toileting, which includes using toliet, bedpan, or urinal?: A Little Help from another person bathing (including washing, rinsing, drying)?: A Little Help from another person to put on and taking off regular upper body clothing?: None Help from another person to put on and taking off regular lower body clothing?: A Little 6 Click Score: 21    End of Session    OT Visit Diagnosis: Unsteadiness on feet (R26.81);Other abnormalities of gait and mobility (R26.89);Cognitive communication deficit (R41.841);Muscle weakness (generalized) (M62.81) Symptoms and signs involving cognitive functions: Cerebral infarction   Activity Tolerance Patient tolerated treatment well   Patient Left Other (comment) (seated in front of sink to finish ADL (pt request privacy) - RN made aware and to check in on pt shortly)   Nurse Communication Mobility status        Time: 1751-0258 OT Time Calculation (min): 37 min  Charges: OT General Charges $OT Visit: 1 Visit OT Treatments $Self Care/Home Management : 23-37 mins  Marcy Siren, OT Acute Rehabilitation Services Pager [phone] Office [phone]    Rodney Kane 07/23/2020, 1:23 PM

## 2020-07-23 NOTE — Progress Notes (Signed)
Physical Therapy Treatment Patient Details Name: Rodney Kane MRN: [id number] DOB: [birth year] Today's Date: 07/23/2020    History of Present Illness Pt is a 45 y.o. male admitted 07/19/20 with expressive difficulties and staggering gait; head CT with acute/subacute infarct in L posterior parietal lobe. MRI with large L MCA infarct with petechial hemorrhagic transporation, punctate R parietal cortical infarct. Pt also with sepsi, secondary to COVID, CHF/nonischemic cardiomyopathy. PMH includes CHF/nonischemic cardiomyopathy (EF 30-35%), HTN, DM2, medication noncompliance.   PT Comments    Pt progressing well with mobility. Pt with no overt instability or LOB with higher level balance tasks. Pt endorses continued numbness/tingling in R foot and R hand; BLE strength 5/5. Pt's main functional limitation at this point is persistent expressive/receptive aphasia and associated cognitive impairments (working with OT and SLP on these). Pt has met short-term acute PT goals; mobilizing consistently with nursing staff. Pt has no further questions or concerns; will d/c acute PT. Continue to recommend follow-up with outpatient neuro PT.   Follow Up Recommendations  Outpatient PT (neuro);Supervision/Assistance - 24 hour     Equipment Recommendations  None recommended by PT    Recommendations for Other Services       Precautions / Restrictions Restrictions Weight Bearing Restrictions: No    Mobility  Bed Mobility Overal bed mobility: Modified Independent Bed Mobility: Supine to Sit;Sit to Supine     Supine to sit: Modified independent (Device/Increase time);HOB elevated Sit to supine: Modified independent (Device/Increase time);HOB elevated   General bed mobility comments: Good awareness of lines; mod indep with HOB slightly elevated  Transfers Overall transfer level: Independent Equipment used: None Transfers: Sit to/from Stand              Ambulation/Gait Ambulation/Gait  assistance: Scientist, forensic (Feet): 400 Feet Assistive device: None Gait Pattern/deviations: Step-through pattern;Decreased stride length Gait velocity: Decreased   General Gait Details: Slow, steady gait with and without pushing IV pole; supervision for line management, although pt with good awareness of safety with lines; able to find room with cues for room number; 1x standing rest break with UE support on rail, endorses R foot numbness/tingling   Stairs             Wheelchair Mobility    Modified Rankin (Stroke Patients Only) Modified Rankin (Stroke Patients Only) Pre-Morbid Rankin Score: No symptoms Modified Rankin: Moderate disability     Balance Overall balance assessment: Needs assistance   Sitting balance-Leahy Scale: Good     Standing balance support: During functional activity;No upper extremity supported Standing balance-Leahy Scale: Good               High level balance activites: Side stepping;Backward walking;Direction changes;Turns;Sudden stops;Head turns High Level Balance Comments: No overt instability or LOB with these higher level balance tasks. Pt also able to pick up item off floor without UE support; bilateral SLS <15-sec; able to high step in place without UE support            Cognition Arousal/Alertness: Awake/alert Behavior During Therapy: WFL for tasks assessed/performed Overall Cognitive Status: Impaired/Different from baseline Area of Impairment: Following commands;Problem solving;Memory;Attention                   Current Attention Level: Selective Memory: Decreased short-term memory Following Commands: Follows one step commands with increased time;Follows multi-step commands with increased time     Problem Solving: Requires verbal cues;Requires tactile cues;Slow processing        Exercises      General  Comments General comments (skin integrity, edema, etc.): VSS on RA. Reports headache improving by  end of session      Pertinent Vitals/Pain Pain Assessment: Faces Faces Pain Scale: Hurts little more Pain Location: R-side headache (RN aware); minor R foot numbness/tingling Pain Descriptors / Indicators: Headache;Tingling Pain Intervention(s): Monitored during session;Limited activity within patient's tolerance    Home Living                      Prior Function            PT Goals (current goals can now be found in the care plan section) Progress towards PT goals: Goals met/education completed, patient discharged from PT    Frequency    Min 4X/week      PT Plan Current plan remains appropriate    Co-evaluation              AM-PAC PT "6 Clicks" Mobility   Outcome Measure  Help needed turning from your back to your side while in a flat bed without using bedrails?: None Help needed moving from lying on your back to sitting on the side of a flat bed without using bedrails?: None Help needed moving to and from a bed to a chair (including a wheelchair)?: None Help needed standing up from a chair using your arms (e.g., wheelchair or bedside chair)?: None Help needed to walk in hospital room?: A Little Help needed climbing 3-5 steps with a railing? : A Little 6 Click Score: 22    End of Session Equipment Utilized During Treatment: Gait belt Activity Tolerance: Patient tolerated treatment well Patient left: in bed;with call bell/phone within reach Nurse Communication: Mobility status PT Visit Diagnosis: Other abnormalities of gait and mobility (R26.89);Difficulty in walking, not elsewhere classified (R26.2);Hemiplegia and hemiparesis Hemiplegia - dominant/non-dominant: Non-dominant Hemiplegia - caused by: Cerebral infarction     Time: 7793-9030 PT Time Calculation (min) (ACUTE ONLY): 21 min  Charges:  $Neuromuscular Re-education: 8-22 mins                     Mabeline Caras, PT, DPT Acute Rehabilitation Services  Pager [phone] Office  [place] 07/23/2020, 5:22 PM

## 2020-07-23 NOTE — Progress Notes (Signed)
Inpatient Diabetes Program Recommendations  AACE/ADA: New Consensus Statement on Inpatient Glycemic Control   Target Ranges:  Prepandial:   less than 140 mg/dL      Peak postprandial:   less than 180 mg/dL (1-2 hours)      Critically ill patients:  140 - 180 mg/dL   Results for HARVEL, MESKILL (MRN [id number]) as of 07/23/2020 12:39  Ref. Range 07/22/2020 08:09 07/22/2020 12:29 07/22/2020 17:44 07/22/2020 21:21 07/23/2020 08:08  Glucose-Capillary Latest Ref Range: 70 - 99 mg/dL 867 (H) 672 (H) 094 (H) 239 (H) 210 (H)   Review of Glycemic Control  Diabetes history: DM2 Outpatient Diabetes medications: Glipizide 10 mg BID, Lantus 50 units QHS, NOvolog 15 units TID with meals, Metformin 1000 mg BID Current orders for Inpatient glycemic control: Levemir 5 units daily, Tradjenta 5 mg daily, Novolog 0-9 units TID with meals, Novolog 0-5 units QHS; Decadron 6 mg daily  Inpatient Diabetes Program Recommendations:    Insulin:  If steroids are continued, please consider increasing Levemir to 5 units BID and ordering Novolog 4 units TID with meals for meal coverage if patient eats at least 50% of meals.  Thanks, Orlando Penner, RN, MSN, CDE Diabetes Coordinator Inpatient Diabetes Program [phone] (Team Pager from 8am to 5pm)

## 2020-07-23 NOTE — Progress Notes (Signed)
Progress Note  Patient Name: Rodney Kane Date of Encounter: 07/23/2020  [facility] Cardiologist:  Last seen by D. Herbie [place] 2018     Subjective   Spoke with pt on phone   He denies SOB   No CP     Inpatient Medications    Scheduled Meds: . vitamin C  500 mg Oral Daily  . atorvastatin  80 mg Oral Daily  . carvedilol  12.5 mg Oral BID WC  . cholecalciferol  1,000 Units Oral Daily  . dexamethasone (DECADRON) injection  6 mg Intravenous Daily  . doxycycline  100 mg Oral Q12H  . glipiZIDE  10 mg Oral BID AC  . insulin aspart  0-5 Units Subcutaneous QHS  . insulin aspart  0-9 Units Subcutaneous TID WC  . insulin glargine  5 Units Subcutaneous Daily  . linagliptin  5 mg Oral Daily  . warfarin  7.5 mg Oral ONCE-1600  . Warfarin - Pharmacist Dosing Inpatient   Does not apply q1600  . zinc sulfate  220 mg Oral Daily   Continuous Infusions: . cefTRIAXone (ROCEPHIN)  IV 1 g (07/23/20 1456)  . heparin 1,100 Units/hr (07/23/20 1203)  . remdesivir 100 mg in NS 100 mL 100 mg (07/23/20 0907)   PRN Meds: acetaminophen   Vital Signs    Vitals:   07/22/20 2123 07/23/20 0000 07/23/20 0500 07/23/20 0618  BP: (!) 139/103   (!) 141/99  Pulse: 87 88  89  Resp: 18   20  Temp: 98 F (36.7 C)   98.8 F (37.1 C)  TempSrc:    Oral  SpO2: 97% 96%  98%  Weight:   123 kg   Height:        Intake/Output Summary (Last 24 hours) at 07/23/2020 1528 Last data filed at 07/23/2020 0000 Gross per 24 hour  Intake 580.64 ml  Output 2 ml  Net 578.64 ml   Last 3 Weights 07/23/2020 07/21/2020 07/21/2020  Weight (lbs) 271 lb 2.7 oz 265 lb 10.5 oz 265 lb 10.5 oz  Weight (kg) 123 kg 120.5 kg 120.5 kg       ECG    No new - Personally Reviewed  Physical Exam  Exam deferred with Hx of COVID to minimize exposure   Reviewed from Hospitalist   Labs    High Sensitivity Troponin:   Recent Labs  Lab 07/16/20 1634 07/16/20 2106 07/19/20 1907 07/19/20 2107  TROPONINIHS 13 12 45* 49*       Chemistry Recent Labs  Lab 07/21/20 0512 07/22/20 0208 07/23/20 0349  NA 134* 137 134*  K 3.1* 4.3 3.7  CL 97* 102 102  CO2 25 23 22   GLUCOSE 186* 244* 205*  BUN 22* 27* 22*  CREATININE 1.15 1.11 1.06  CALCIUM 8.3* 8.9 8.7*  PROT 6.5 6.6 6.2*  ALBUMIN 3.0* 3.0* 3.0*  AST 21 31 25   ALT 18 23 24   ALKPHOS 36* 56 53  BILITOT 0.9 0.4 0.7  GFRNONAA >60 >60 >60  ANIONGAP 12 12 10      Hematology Recent Labs  Lab 07/21/20 0512 07/22/20 0208 07/23/20 0349  WBC 5.3 7.2 9.1  RBC 5.34 5.20 5.17  HGB 13.5 13.5 13.0  HCT 41.3 40.5 40.3  MCV 77.3* 77.9* 77.9*  MCH 25.3* 26.0 25.1*  MCHC 32.7 33.3 32.3  RDW 13.0 13.2 13.1  PLT 263 300 302    BNP Recent Labs  Lab 07/19/20 1907  BNP 1,619.5*     DDimer  Recent Labs  Lab 07/21/20 0512 07/22/20 0208 07/23/20 0349  DDIMER 1.43* 0.71* 0.48     Radiology    No results found.  Cardiac Studies    Echo:   07/20/20  1. A small (8 mm diameter), almost spherical, slightly mobile ventricular apical thrombus is seen. Left ventricular ejection fraction, by estimation, is 20 to 25%. The left ventricle has severely decreased function. The left ventricle demonstrates global hypokinesis. The left ventricular internal cavity size was moderately dilated. Indeterminate diastolic filling due to E-A fusion. 2. Right ventricular systolic function is mildly reduced. The right ventricular size is normal. Tricuspid regurgitation signal is inadequate for assessing PA pressure. 3. Left atrial size was moderately dilated. 4. A small pericardial effusion is present. The pericardial effusion is localized near the right atrium. 5. The mitral valve is normal in structure. No evidence of mitral valve regurgitation. No evidence of mitral stenosis. 6. The aortic valve is tricuspid. Aortic valve regurgitation is not visualized. No aortic stenosis is present. 7. The inferior vena cava is normal in size with greater than 50% respiratory  variability, suggesting right atrial pressure of 3 mmHg. Comparison(s): Prior images unable to be directly viewed, comparison made by report only. The left ventricular function is worsened. A new apical LV thrombus is seen.  Patient Profile      Rodney Kane is a 45 y.o. male with a hx of uncontrolled hypertension, DM 2, nonischemic cardiomyopathy diagnosed in June 2018 and history of medication noncompliance who had cardiology consulted for the evaluation of LV thrombus and drop in EF at the request of Dr. Randol Kern.    Assessment & Plan    1  LV thrombus   New finding  Pt currently on heparin and starting warfarin   2  Systollic CHF Known CHF   Pt noncomplient with meds     LVEF 20 to 25%  Down from previous 30 to 35%  RVEF mildly reduced    Seen by D Mclean on 07/20/20  Volume OK   I/O are incomplete Agree with carvedilol    BP has been labile  Follow for now   Given CVA do nowt want to risk hypotension.   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   Felt to be embolic with mural thrombus present   4  COVID  Pt unvaccinated   Probably early in course of infection      For questions or updates, please contact [facility] Please consult [email or web address] for contact info under        Signed, Dietrich Pates, MD  07/23/2020, 3:28 PM

## 2020-07-23 NOTE — Progress Notes (Addendum)
ANTICOAGULATION CONSULT NOTE  Pharmacy Consult for Heparin and Warfarin Indication: LV thrombus with recent CVA  No Known Allergies  Patient Measurements: Height: 6\' 1"  (185.4 cm) Weight: 123 kg (271 lb 2.7 oz) IBW/kg (Calculated) : 79.9 Heparin Dosing Weight: 105.6 kg  Vital Signs: Temp: 98.8 F (37.1 C) (01/03 0618) Temp Source: Oral (01/03 0618) BP: 141/99 (01/03 0618) Pulse Rate: 89 (01/03 0618)  Labs: Recent Labs    07/21/20 0512 07/22/20 0208 07/22/20 1421 07/23/20 0349  HGB 13.5 13.5  --  13.0  HCT 41.3 40.5  --  40.3  PLT 263 300  --  302  LABPROT  --   --  15.1 14.7  INR  --   --  1.2 1.2  HEPARINUNFRC 0.67 0.68 0.65 0.30  CREATININE 1.15 1.11  --  1.06    Estimated Creatinine Clearance: 120.9 mL/min (by C-G formula based on SCr of 1.06 mg/dL).   Assessment: Patient is a 62 yom that is admitted for a CVA. Patient was also found to have a LV thrombus on evaluation and is on no prior anticoagulation. Pharmacy has been asked to bridge heparin at this time for LV thrombus. Most recent HL is at goal at 0.30. Patient will be starting warfarin and requiring education prior to discharge. Patient is currently on doxycycline which can increase the effects of warfarin. Neuro has agreed with an INR goal of 2-2.5. INR today is 1.2. No signs of bleeding per nurse.   Goal of Therapy:  Heparin level 0.3-0.5 units/ml Monitor platelets by anticoagulation protocol: Yes  INR Goal 2-2.5 per neuro   Plan:  - Continue heparin at 900units/hour to target lower end of goal  - Warfarin 7.5mg x1 today  - Monitor for s/sx of bleed  - HL and CBC daily  - Daily INR, continue heparin bridge until INR therapeutic   Mac Dowdell A. 53, PharmD, BCPS, FNKF Clinical Pharmacist [place] Please utilize Amion for appropriate phone number to reach the unit pharmacist [facility])  Addendum:  Confirmatory HL 0.22. As below range,  will increase to 1100 units/hr. HL in 6 hours  Rafeef Lau A.  [facility], PharmD, BCPS, FNKF Clinical Pharmacist [place] Please utilize Amion for appropriate phone number to reach the unit pharmacist [facility])     07/23/2020 7:35 AM

## 2020-07-23 NOTE — Progress Notes (Signed)
ANTICOAGULATION CONSULT NOTE  Pharmacy Consult for Heparin and Warfarin Indication: LV thrombus with recent CVA  No Known Allergies  Patient Measurements: Height: 6\' 1"  (185.4 cm) Weight: 123 kg (271 lb 2.7 oz) IBW/kg (Calculated) : 79.9 Heparin Dosing Weight: 105.6 kg  Vital Signs: Temp: 97.3 F (36.3 C) (01/03 1600) Temp Source: Oral (01/03 1600) BP: 141/107 (01/03 1600) Pulse Rate: 85 (01/03 1600)  Labs: Recent Labs    07/21/20 0512 07/22/20 0208 07/22/20 1421 07/23/20 0349 07/23/20 0913 07/23/20 1800  HGB 13.5 13.5  --  13.0  --   --   HCT 41.3 40.5  --  40.3  --   --   PLT 263 300  --  302  --   --   LABPROT  --   --  15.1 14.7  --   --   INR  --   --  1.2 1.2  --   --   HEPARINUNFRC 0.67 0.68 0.65 0.30 0.22* 0.26*  CREATININE 1.15 1.11  --  1.06  --   --     Estimated Creatinine Clearance: 120.9 mL/min (by C-G formula based on SCr of 1.06 mg/dL).   Assessment: Patient is a 70 yom that is admitted for a CVA. Patient was also found to have a LV thrombus on evaluation and is on no prior anticoagulation. Pharmacy has been asked to bridge heparin at this time for LV thrombus, warfarin load started 1/2.   Heparin level tonight remains subtherapeutic at 0.26 but trending up.  Goal of Therapy:  Heparin level 0.3-0.5 units/ml Monitor platelets by anticoagulation protocol: Yes  INR Goal 2-2.5 per neuro   Plan:  -Increase heparin to 1250 units/h -Recheck heparin level with am labs   53, PharmD, BCPS, [facility] Clinical Pharmacist [phone] Please check AMION for all [facility] numbers 07/23/2020

## 2020-07-23 NOTE — Progress Notes (Signed)
PROGRESS NOTE                                                                             PROGRESS NOTE                                                                                                                                                                                                             Patient Demographics:    Rodney Kane, is a 45 y.o. male, DOB - 1976/03/16, ZOX:[id number]  Outpatient Primary MD for the patient is Kallie LocksStroud, Natalie M, FNP    LOS - 4  Admit date - 07/19/2020    Chief Complaint  Patient presents with  . Altered Mental Status       Brief Narrative     HPI: Rodney PostJohnny Gilson is a 45 y.o. 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  Family reported to EMS that patient has had difficulty speaking for the last several days.  Unable to obtain any history from patient at this time due to his expressive aphasia.  No family bedside. Of note, patient was seen in the ED several days ago for chest pain and shortness of breath.  He had an unremarkable work-up at that time including CT of his chest showing no PE.  ED Course: Febrile with temperature 102.2 F.  Tachycardic with heart rate in the 140s.  Not hypotensive.  Not tachypneic or hypoxic.  SARS-CoV-2 PCR test positive.  Influenza panel negative.  WBC 6.5, hemoglobin 14.1, hematocrit 44.2, platelet count 242K.  Sodium 130, potassium 3.8, chloride 94, bicarb 21, BUN 15, creatinine 1.1, glucose 182.  LFTs normal.  Lipase normal.  Lactic acid 2.2 >2.6.  INR 1.2.  TSH normal.  Free T4 borderline elevated at 1.26.  Magnesium within normal range.  RPR pending.  Ammonia level <9.  Blood ethanol level undetectable.  UA  and urine culture pending.  UDS pending.  Blood culture x2 pending.  Initial high-sensitivity troponin 45, repeat pending.  BNP significantly elevated at 1619.  VBG with pH 7.48.  Triglycerides within normal range.  Ferritin, fibrinogen, LDH, CRP, D-dimer, procalcitonin pending.  CT head showing acute/subacute infarct in the left posterior parietal lobe.  Mild mass-effect without midline shift.  No acute intracranial hemorrhage.  ED provider discussed the case with Dr. Wilford Corner from neurology who recommended admission for stroke work-up with MRI and CTA of head and neck. CTA head and neck negative for LVO.  Brain MRI pending.  Chest x-ray showing cardiomegaly and pulmonary vascular congestion.  Given persistent tachycardia there is question for a flutter versus A. fib.  However, ED provider spoke to Dr. Mayford Knife who reviewed the patient's EKG and felt that it was showing sinus tachycardia.  Patient received Tylenol, ceftriaxone, and 1 L LR bolus initially for sepsis.  He was later given a dose of IV Lasix 40 mg for volume overload.    Subjective:    Naftali Carchi today soft denies any complaints, no significant events overnight as discussed with staff.    Assessment  & Plan :    Principal Problem:   COVID-19 virus infection Active Problems:   CHF exacerbation (HCC)   Severe sepsis (HCC)   Sinus tachycardia   Acute CVA (cerebrovascular accident) (HCC)     SpO2: 98 %  Recent Labs  Lab 07/16/20 1634 07/16/20 2229 07/19/20 1831 07/19/20 1853 07/19/20 1907 07/19/20 2053 07/19/20 2107 07/20/20 0416 07/21/20 0512 07/22/20 0208 07/22/20 1421 07/23/20 0349  WBC 4.8  --   --  6.5  --   --   --   --  5.3 7.2  --  9.1  PLT 281  --   --  242  --   --   --   --  263 300  --  302  CRP  --   --   --   --   --   --  12.4*  --  12.5* 7.6*  --  4.6*  BNP  --   --   --   --  1,610.9*  --   --   --   --   --   --   --   DDIMER  --  0.58*  --   --   --   --  1.27*  --  1.43* 0.71*  --  0.48   PROCALCITON  --   --   --   --   --   --  0.68  --   --   --   --   --   AST  --   --   --  22  --   --   --   --  21 31  --  25  ALT  --   --   --  21  --   --   --   --  18 23  --  24  ALKPHOS  --   --   --  48  --   --   --   --  36* 56  --  53  BILITOT  --   --   --  1.0  --   --   --   --  0.9 0.4  --  0.7  ALBUMIN  --   --   --  3.6  --   --   --   --  3.0* 3.0*  --  3.0*  INR  --   --   --  1.2  --   --   --   --   --   --  1.2 1.2  LATICACIDVEN  --   --   --  2.2*  --  2.6*  --  3.4*  --   --   --   --   SARSCOV2NAA  --  NEGATIVE POSITIVE*  --   --   --   --   --   --   --   --   --        ABG     Component Value Date/Time   PHART 7.458 (H) 01/05/2017 0817   PCO2ART 39.9 01/05/2017 0817   PO2ART 70.0 (L) 01/05/2017 0817   HCO3 25.6 07/19/2020 1921   TCO2 27 07/19/2020 1921   O2SAT 54.0 07/19/2020 1921     Acute CVA:  - Patient with  expressive aphasia for several days and staggering gait. -MRI brain cannot for large acute infarct in the posterior left hemisphere with petechial hemorrhage and cytotoxic edema, but no malignant hemorrhagic transformation or significant intracranial mass effect, solitary small acute cortical infarcts superimposed in the superior right parietal lobe, and chronic microhemorrhages . -  CT head showing acute/subacute infarct in the left posterior parietal lobe.  Mild mass-effect without midline shift.  No acute intracranial hemorrhage.  CTA head and neck negative for LVO. -Significant for low EF 20 to 25%, and apical thrombus, most likely the cause of his acute CVA, started on heparin drip.  He is started on warfarin as well. -Hemoglobin A1c 7.7, LDL 102 -Atorvastatin 80 mg daily -PT, OT, speech therapy    COVID-19 viral infection: -Patient is unvaccinated. -No pneumonia on imaging, no hypoxia.he is very likely early in his COVID 19 infection to have any symptoms's. - started empirically on remdesivir and steroids at this point. -Continue  to trend inflammatory markers -Procalcitonin of 0.68 will narrow from vancomycin and Zosyn to Rocephin and doxycycline.   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 -Repeat 2D echo this admission with low EF 20 to 25%, and apical thrombus, low EF and thrombus most likely in the setting of known chronic CHF, unlikely Covid at this point contributing given patient is still early in his Covid disease, with no evidence of pneumonia or significant coagulopathy yet, so low EF most likely progression of his known nonischemic cardiomyopathy with noncompliance with medications, and apical thrombus are related to that and not due to Covid. -Cardiology input greatly appreciated, on Coreg, will need Entresto, spironolactone when more stable , on heparin GTT, he is started on warfarin for his mural thrombus.    Mild troponin elevation:  -Most likely in the setting of acute CVA, and atrial thrombus .  Mild hyponatremia:  -L solved with hydration  Hypertension -Resumed home Coreg given persistent tachycardia.  Hold any other antihypertensives at this time to allow permissive hypertension in the setting of stroke which is possibly acute.  Hyperlipidemia -Continue statin, dose increased to full intensity statin given stroke  Hypokalemia - repleted  Insulin-dependent type 2 diabetes:  -A1c is 7.7. -BG remains elevated, will resume on home dose glipizide, meanwhile continue with Tradjenta, Levemir and insulin sliding scale  QT prolongation on EKG -Cardiac monitoring -Keep potassium above 4 and magnesium above 2 -Repeat EKG in a.m. -Avoid QT prolonging drugs  if possible.  Medications noncompliance -Will be counseled  Condition - Extremely Guarded  Family Communication  : D/W daughter by phone 07/21/2020, 07/22/2020  Code Status :  Full  Consults  :  Neurology, cardiology  Procedures  :   none  Disposition Plan  :    Status is: Inpatient  Remains inpatient appropriate because:IV treatments appropriate due to intensity of illness or inability to take PO   Dispo: The patient is from: Home              Anticipated d/c is to: SNF              Anticipated d/c date is: 2 days              Patient currently is not medically stable to d/c.  Remains on heparin GTT.      DVT Prophylaxis  :  Heparin GTT  Lab Results  Component Value Date   PLT 302 07/23/2020    Diet :  Diet Order            Diet Carb Modified Fluid consistency: Thin; Room service appropriate? Yes  Diet effective now                  Inpatient Medications  Scheduled Meds: . vitamin C  500 mg Oral Daily  . atorvastatin  80 mg Oral Daily  . carvedilol  12.5 mg Oral BID WC  . cholecalciferol  1,000 Units Oral Daily  . dexamethasone (DECADRON) injection  6 mg Intravenous Daily  . doxycycline  100 mg Oral Q12H  . insulin aspart  0-5 Units Subcutaneous QHS  . insulin aspart  0-9 Units Subcutaneous TID WC  . insulin glargine  5 Units Subcutaneous Daily  . linagliptin  5 mg Oral Daily  . warfarin  7.5 mg Oral ONCE-1600  . Warfarin - Pharmacist Dosing Inpatient   Does not apply q1600  . zinc sulfate  220 mg Oral Daily   Continuous Infusions: . cefTRIAXone (ROCEPHIN)  IV 1 g (07/23/20 1456)  . heparin 1,100 Units/hr (07/23/20 1203)  . remdesivir 100 mg in NS 100 mL 100 mg (07/23/20 0907)   PRN Meds:.acetaminophen  Antibiotics  :    Anti-infectives (From admission, onward)   Start     Dose/Rate Route Frequency Ordered Stop   07/21/20 1000  remdesivir 100 mg in sodium chloride 0.9 % 100 mL IVPB       "Followed by" Linked Group Details   100 mg 200 mL/hr over 30 Minutes Intravenous Daily 07/20/20 0055 07/25/20 0959   07/20/20 1800  vancomycin (VANCOREADY) IVPB 1500 mg/300 mL  Status:  Discontinued        1,500 mg 150 mL/hr over 120 Minutes Intravenous Every 12 hours 07/20/20 0628 07/20/20  1425   07/20/20 1500  cefTRIAXone (ROCEPHIN) 1 g in sodium chloride 0.9 % 100 mL IVPB        1 g 200 mL/hr over 30 Minutes Intravenous Every 24 hours 07/20/20 1425     07/20/20 1430  doxycycline (VIBRA-TABS) tablet 100 mg        100 mg Oral Every 12 hours 07/20/20 1425     07/20/20 0700  vancomycin (VANCOREADY) IVPB 2000 mg/400 mL        2,000 mg 200 mL/hr over 120 Minutes Intravenous  Once 07/20/20 0628 07/20/20 1355   07/20/20 0630  piperacillin-tazobactam (ZOSYN) IVPB 3.375 g  Status:  Discontinued        3.375 g  12.5 mL/hr over 240 Minutes Intravenous Every 8 hours 07/20/20 0628 07/20/20 1425   07/20/20 0200  remdesivir 200 mg in sodium chloride 0.9% 250 mL IVPB       "Followed by" Linked Group Details   200 mg 580 mL/hr over 30 Minutes Intravenous Once 07/20/20 0055 07/20/20 0238   07/19/20 1900  cefTRIAXone (ROCEPHIN) 2 g in sodium chloride 0.9 % 100 mL IVPB        2 g 200 mL/hr over 30 Minutes Intravenous  Once 07/19/20 1856 07/19/20 2040        Jaquavius Hudler M.D on 07/23/2020 at 2:58 PM  To page go to [email or web address]   [facility] -  Office  [phone]      Objective:   Vitals:   07/22/20 2123 07/23/20 0000 07/23/20 0500 07/23/20 0618  BP: (!) 139/103   (!) 141/99  Pulse: 87 88  89  Resp: 18   20  Temp: 98 F (36.7 C)   98.8 F (37.1 C)  TempSrc:    Oral  SpO2: 97% 96%  98%  Weight:   123 kg   Height:        Wt Readings from Last 3 Encounters:  07/23/20 123 kg  07/16/20 119 kg  02/23/20 119.7 kg     Intake/Output Summary (Last 24 hours) at 07/23/2020 1458 Last data filed at 07/23/2020 0000 Gross per 24 hour  Intake 680.64 ml  Output 2 ml  Net 678.64 ml     Physical Exam  Awake, alert, he remains with significant receptive and expressive aphasia . Symmetrical chest wall movement, Good air movement bilaterally, CTAB RRR,No Gallops,Rubs or new Murmurs, No Parasternal Heave +ve B.Sounds, Abd Soft, No tenderness, No rebound - guarding or  rigidity. No Cyanosis, Clubbing or edema, No new Rash or bruise      Data Review:    CBC Recent Labs  Lab 07/16/20 1634 07/19/20 1853 07/19/20 1921 07/21/20 0512 07/22/20 0208 07/23/20 0349  WBC 4.8 6.5  --  5.3 7.2 9.1  HGB 13.2 14.1 15.3 13.5 13.5 13.0  HCT 41.2 44.2 45.0 41.3 40.5 40.3  PLT 281 242  --  263 300 302  MCV 81.7 79.2*  --  77.3* 77.9* 77.9*  MCH 26.2 25.3*  --  25.3* 26.0 25.1*  MCHC 32.0 31.9  --  32.7 33.3 32.3  RDW 13.4 12.8  --  13.0 13.2 13.1  LYMPHSABS  --  0.8  --  1.7 1.7 1.6  MONOABS  --  0.7  --  0.6 0.7 0.6  EOSABS  --  0.0  --  0.0 0.0 0.0  BASOSABS  --  0.0  --  0.0 0.0 0.0    Recent Labs  Lab 07/16/20 1634 07/16/20 2229 07/19/20 1853 07/19/20 1855 07/19/20 1856 07/19/20 1907 07/19/20 1921 07/19/20 2053 07/19/20 2107 07/20/20 0416 07/21/20 0512 07/22/20 0208 07/22/20 1421 07/23/20 0349  NA 141  --  130*  --   --   --  133*  --   --   --  134* 137  --  134*  K 4.0  --  3.8  --   --   --  3.9  --   --   --  3.1* 4.3  --  3.7  CL 105  --  94*  --   --   --   --   --   --   --  97* 102  --  102  CO2 27  --  21*  --   --   --   --   --   --   --  25 23  --  22  GLUCOSE 141*  --  182*  --   --   --   --   --   --   --  186* 244*  --  205*  BUN 14  --  15  --   --   --   --   --   --   --  22* 27*  --  22*  CREATININE 1.02  --  1.19  --   --   --   --   --   --   --  1.15 1.11  --  1.06  CALCIUM 9.1  --  9.0  --   --   --   --   --   --   --  8.3* 8.9  --  8.7*  AST  --   --  22  --   --   --   --   --   --   --  21 31  --  25  ALT  --   --  21  --   --   --   --   --   --   --  18 23  --  24  ALKPHOS  --   --  48  --   --   --   --   --   --   --  36* 56  --  53  BILITOT  --   --  1.0  --   --   --   --   --   --   --  0.9 0.4  --  0.7  ALBUMIN  --   --  3.6  --   --   --   --   --   --   --  3.0* 3.0*  --  3.0*  MG  --   --  1.8  --   --   --   --   --   --   --   --  2.0  --   --   CRP  --   --   --   --   --   --   --   --  12.4*   --  12.5* 7.6*  --  4.6*  DDIMER  --  0.58*  --   --   --   --   --   --  1.27*  --  1.43* 0.71*  --  0.48  PROCALCITON  --   --   --   --   --   --   --   --  0.68  --   --   --   --   --   LATICACIDVEN  --   --  2.2*  --   --   --   --  2.6*  --  3.4*  --   --   --   --   INR  --   --  1.2  --   --   --   --   --   --   --   --   --  1.2 1.2  TSH  --   --   --  1.447  --   --   --   --   --   --   --   --   --   --  HGBA1C  --   --   --   --   --   --   --   --   --  7.7*  --   --   --   --   AMMONIA  --   --   --   --  <9*  --   --   --   --   --   --   --   --   --   BNP  --   --   --   --   --  1,619.5*  --   --   --   --   --   --   --   --     ------------------------------------------------------------------------------------------------------------------ No results for input(s): CHOL, HDL, LDLCALC, TRIG, CHOLHDL, LDLDIRECT in the last 72 hours.  Lab Results  Component Value Date   HGBA1C 7.7 (H) 07/20/2020   ------------------------------------------------------------------------------------------------------------------ No results for input(s): TSH, T4TOTAL, T3FREE, THYROIDAB in the last 72 hours.  Invalid input(s): FREET3  Cardiac Enzymes No results for input(s): CKMB, TROPONINI, MYOGLOBIN in the last 168 hours.  Invalid input(s): CK ------------------------------------------------------------------------------------------------------------------    Component Value Date/Time   BNP 1,619.5 (H) 07/19/2020 1907    Micro Results Recent Results (from the past 240 hour(s))  Resp Panel by RT-PCR (Flu A&B, Covid) Nasopharyngeal Swab     Status: None   Collection Time: 07/16/20 10:29 PM   Specimen: Nasopharyngeal Swab; Nasopharyngeal(NP) swabs in vial transport medium  Result Value Ref Range Status   SARS Coronavirus 2 by RT PCR NEGATIVE NEGATIVE Final    Comment: (NOTE) SARS-CoV-2 target nucleic acids are NOT DETECTED.  The SARS-CoV-2 RNA is generally detectable in  upper respiratory specimens during the acute phase of infection. The lowest concentration of SARS-CoV-2 viral copies this assay can detect is 138 copies/mL. A negative result does not preclude SARS-Cov-2 infection and should not be used as the sole basis for treatment or other patient management decisions. A negative result may occur with  improper specimen collection/handling, submission of specimen other than nasopharyngeal swab, presence of viral mutation(s) within the areas targeted by this assay, and inadequate number of viral copies(<138 copies/mL). A negative result must be combined with clinical observations, patient history, and epidemiological information. The expected result is Negative.  Fact Sheet for Patients:  BloggerCourse.com  Fact Sheet for Healthcare Providers:  SeriousBroker.it  This test is no t yet approved or cleared by the [place] FDA and  has been authorized for detection and/or diagnosis of SARS-CoV-2 by FDA under an Emergency Use Authorization (EUA). This EUA will remain  in effect (meaning this test can be used) for the duration of the COVID-19 declaration under Section 564(b)(1) of the Act, 21 U.S.C.section 360bbb-3(b)(1), unless the authorization is terminated  or revoked sooner.       Influenza A by PCR NEGATIVE NEGATIVE Final   Influenza B by PCR NEGATIVE NEGATIVE Final    Comment: (NOTE) The Xpert Xpress SARS-CoV-2/FLU/RSV plus assay is intended as an aid in the diagnosis of influenza from Nasopharyngeal swab specimens and should not be used as a sole basis for treatment. Nasal washings and aspirates are unacceptable for Xpert Xpress SARS-CoV-2/FLU/RSV testing.  Fact Sheet for Patients: BloggerCourse.com  Fact Sheet for Healthcare Providers: SeriousBroker.it  This test is not yet approved or cleared by the [place] FDA and has been  authorized for detection and/or diagnosis of SARS-CoV-2 by FDA under an Emergency Use Authorization (EUA). This EUA will  remain in effect (meaning this test can be used) for the duration of the COVID-19 declaration under Section 564(b)(1) of the Act, 21 U.S.C. section 360bbb-3(b)(1), unless the authorization is terminated or revoked.  Performed at [facility], [address], [place], [place] [address]   Resp Panel by RT-PCR (Flu A&B, Covid) Nasopharyngeal Swab     Status: Abnormal   Collection Time: 07/19/20  6:31 PM   Specimen: Nasopharyngeal Swab; Nasopharyngeal(NP) swabs in vial transport medium  Result Value Ref Range Status   SARS Coronavirus 2 by RT PCR POSITIVE (A) NEGATIVE Final    Comment: RESULT CALLED TO, READ BACK BY AND VERIFIED WITH: Rema Jasmine, RN 07/19/20 at 1947 sk  (NOTE) SARS-CoV-2 target nucleic acids are DETECTED.  The SARS-CoV-2 RNA is generally detectable in upper respiratory specimens during the acute phase of infection. Positive results are indicative of the presence of the identified virus, but do not rule out bacterial infection or co-infection with other pathogens not detected by the test. Clinical correlation with patient history and other diagnostic information is necessary to determine patient infection status. The expected result is Negative.  Fact Sheet for Patients: BloggerCourse.com  Fact Sheet for Healthcare Providers: SeriousBroker.it  This test is not yet approved or cleared by the [place] FDA and  has been authorized for detection and/or diagnosis of SARS-CoV-2 by FDA under an Emergency Use Authorization (EUA).  This EUA will remain in effect (meaning this test can  be used) for the duration of  the COVID-19 declaration under Section 564(b)(1) of the Act, 21 U.S.C. section 360bbb-3(b)(1), unless the authorization is terminated or revoked sooner.     Influenza A by PCR  NEGATIVE NEGATIVE Final   Influenza B by PCR NEGATIVE NEGATIVE Final    Comment: (NOTE) The Xpert Xpress SARS-CoV-2/FLU/RSV plus assay is intended as an aid in the diagnosis of influenza from Nasopharyngeal swab specimens and should not be used as a sole basis for treatment. Nasal washings and aspirates are unacceptable for Xpert Xpress SARS-CoV-2/FLU/RSV testing.  Fact Sheet for Patients: BloggerCourse.com  Fact Sheet for Healthcare Providers: SeriousBroker.it  This test is not yet approved or cleared by the [place] FDA and has been authorized for detection and/or diagnosis of SARS-CoV-2 by FDA under an Emergency Use Authorization (EUA). This EUA will remain in effect (meaning this test can be used) for the duration of the COVID-19 declaration under Section 564(b)(1) of the Act, 21 U.S.C. section 360bbb-3(b)(1), unless the authorization is terminated or revoked.  Performed at [facility], [address], [place], [place] [address]   Blood Culture (routine x 2)     Status: None (Preliminary result)   Collection Time: 07/19/20  6:58 PM   Specimen: BLOOD  Result Value Ref Range Status   Specimen Description BLOOD LEFT ANTECUBITAL  Final   Special Requests   Final    BOTTLES DRAWN AEROBIC AND ANAEROBIC Blood Culture adequate volume   Culture   Final    NO GROWTH 4 DAYS Performed at [facility], [address] [place], [place] [address]    Report Status PENDING  Incomplete    Radiology Reports CT Angio Head W or Wo Contrast  Result Date: 07/19/2020 CLINICAL DATA:  Stroke-like symptoms. Expressive aphasia, headache, confusion EXAM: CT ANGIOGRAPHY HEAD AND NECK TECHNIQUE: Multidetector CT imaging of the head and neck was performed using the standard protocol during bolus administration of intravenous contrast. Multiplanar CT image reconstructions and MIPs were obtained to  evaluate the vascular anatomy. Carotid  stenosis measurements (when applicable) are obtained utilizing NASCET criteria, using the distal internal carotid diameter as the denominator. CONTRAST:  84mL OMNIPAQUE IOHEXOL 350 MG/ML SOLN COMPARISON:  CT head 07/19/2020 FINDINGS: CTA NECK FINDINGS Aortic arch: Normal aortic arch and proximal great vessels. Bovine branching pattern. Right carotid system: Normal right carotid. Negative for stenosis or dissection. Left carotid system: Normal left carotid. Negative for stenosis or dissection. Vertebral arteries: Both vertebral arteries are normal and patent to the basilar. Right vertebral artery dominant. Skeleton: No acute skeletal abnormality. Other neck: Negative for mass or adenopathy in the neck. Upper chest: Small bilateral effusions. Patchy nodular airspace disease left upper lobe, not present on CT chest 07/17/2020. Possible pneumonia or edema. Review of the MIP images confirms the above findings CTA HEAD FINDINGS Anterior circulation: Normal cavernous carotid. Right anterior and middle cerebral arteries widely patent. Left anterior cerebral artery patent. Left M1 segment widely patent. Left MCA bifurcation normal. No large vessel occlusion. There is an area of hypoperfusion in the left parietal lobe corresponding to the hypodensity. This is consistent with recent infarct with distal MCA occlusion Posterior circulation: Both vertebral arteries patent to the basilar. PICA patent bilaterally. Basilar widely patent. Posterior circulation normal without stenosis or large vessel occlusion. Venous sinuses: Normal venous enhancement. Anatomic variants: None Review of the MIP images confirms the above findings IMPRESSION: 1. Normal carotid and vertebral arteries in the neck 2. No intracranial large vessel occlusion. There is a small area of hypoperfusion in the left parietal lobe consistent with subacute infarct. This area shows hypodensity on CT. Electronically Signed   By: Marlan Palau M.D.   On: 07/19/2020  20:58   DG Abd 1 View  Result Date: 07/20/2020 CLINICAL DATA:  Pre MRI EXAM: ABDOMEN - 1 VIEW COMPARISON:  CT 02/24/2020 FINDINGS: No implantable device seen. Patient's metallic belt buckle and zipper noted. Nonobstructive bowel gas pattern. No organomegaly or free air. IMPRESSION: No visible implantable device. Electronically Signed   By: Charlett Nose M.D.   On: 07/20/2020 07:35   CT Head Wo Contrast  Result Date: 07/19/2020 CLINICAL DATA:  Altered mental status. Stroke-like symptoms for 4 days. Expressive aphasia, headache, and confusion. EXAM: CT HEAD WITHOUT CONTRAST TECHNIQUE: Contiguous axial images were obtained from the base of the skull through the vertex without intravenous contrast. COMPARISON:  None. FINDINGS: Brain: Poorly defined area of low attenuation change extending to the cortical surface and involving the left posterior parietal lobe. This is consistent with acute or subacute infarct. Mild mass effect with effacement of sulci and left lateral ventricle. No midline shift. No abnormal extra-axial fluid collections. No acute intracranial hemorrhage. No ventricular dilatation. Vascular: Mild intracranial arterial vascular calcifications. Skull: Calvarium appears intact. Sinuses/Orbits: Paranasal sinuses and mastoid air cells are clear. Other: None. IMPRESSION: Low-attenuation in the left posterior parietal lobe consistent with acute or subacute infarct. Mild mass effect without midline shift. No acute intracranial hemorrhage. Electronically Signed   By: Burman Nieves M.D.   On: 07/19/2020 19:36   CT Angio Neck W and/or Wo Contrast  Result Date: 07/19/2020 CLINICAL DATA:  Stroke-like symptoms. Expressive aphasia, headache, confusion EXAM: CT ANGIOGRAPHY HEAD AND NECK TECHNIQUE: Multidetector CT imaging of the head and neck was performed using the standard protocol during bolus administration of intravenous contrast. Multiplanar CT image reconstructions and MIPs were obtained to  evaluate the vascular anatomy. Carotid stenosis measurements (when applicable) are obtained utilizing NASCET criteria, using the distal internal carotid diameter as  the denominator. CONTRAST:  98mL OMNIPAQUE IOHEXOL 350 MG/ML SOLN COMPARISON:  CT head 07/19/2020 FINDINGS: CTA NECK FINDINGS Aortic arch: Normal aortic arch and proximal great vessels. Bovine branching pattern. Right carotid system: Normal right carotid. Negative for stenosis or dissection. Left carotid system: Normal left carotid. Negative for stenosis or dissection. Vertebral arteries: Both vertebral arteries are normal and patent to the basilar. Right vertebral artery dominant. Skeleton: No acute skeletal abnormality. Other neck: Negative for mass or adenopathy in the neck. Upper chest: Small bilateral effusions. Patchy nodular airspace disease left upper lobe, not present on CT chest 07/17/2020. Possible pneumonia or edema. Review of the MIP images confirms the above findings CTA HEAD FINDINGS Anterior circulation: Normal cavernous carotid. Right anterior and middle cerebral arteries widely patent. Left anterior cerebral artery patent. Left M1 segment widely patent. Left MCA bifurcation normal. No large vessel occlusion. There is an area of hypoperfusion in the left parietal lobe corresponding to the hypodensity. This is consistent with recent infarct with distal MCA occlusion Posterior circulation: Both vertebral arteries patent to the basilar. PICA patent bilaterally. Basilar widely patent. Posterior circulation normal without stenosis or large vessel occlusion. Venous sinuses: Normal venous enhancement. Anatomic variants: None Review of the MIP images confirms the above findings IMPRESSION: 1. Normal carotid and vertebral arteries in the neck 2. No intracranial large vessel occlusion. There is a small area of hypoperfusion in the left parietal lobe consistent with subacute infarct. This area shows hypodensity on CT. Electronically Signed   By:  Marlan Palau M.D.   On: 07/19/2020 20:58   CT Angio Chest PE W and/or Wo Contrast  Result Date: 07/17/2020 CLINICAL DATA:  Dyspnea, left chest pain, productive cough EXAM: CT ANGIOGRAPHY CHEST WITH CONTRAST TECHNIQUE: Multidetector CT imaging of the chest was performed using the standard protocol during bolus administration of intravenous contrast. Multiplanar CT image reconstructions and MIPs were obtained to evaluate the vascular anatomy. CONTRAST:  59mL OMNIPAQUE IOHEXOL 350 MG/ML SOLN COMPARISON:  None FINDINGS: Cardiovascular: There is adequate opacification of the pulmonary arterial tree. There is no intraluminal filling defect identified to suggest acute pulmonary embolism. Central pulmonary arteries are of normal caliber. No significant coronary artery calcification. Global cardiac size is within normal limits. Mild left ventricular dilation, however, is noted. No pericardial effusion. The thoracic aorta is unremarkable. Mediastinum/Nodes: No enlarged mediastinal, hilar, or axillary lymph nodes. Thyroid gland, trachea, and esophagus demonstrate no significant findings. Lungs/Pleura: Small right pleural effusion is present with minimal associated right basilar atelectasis. The lungs are otherwise clear. No pneumothorax. No central obstructing lesion. Upper Abdomen: No acute abnormality. Musculoskeletal: Osseous structures are age-appropriate. Gynecomastia noted. Review of the MIP images confirms the above findings. IMPRESSION: No pulmonary embolism. Mild left ventricular dilation. This could be better assessed with echocardiography. Small right pleural effusion. Electronically Signed   By: Helyn Numbers MD   On: 07/17/2020 01:55   MR Brain Wo Contrast (neuro protocol)  Result Date: 07/20/2020 CLINICAL DATA:  45 year old male with stroke-like symptoms for 4 days. 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 COVID-19. EXAM: MRI HEAD WITHOUT CONTRAST  TECHNIQUE: Multiplanar, multiecho pulse sequences of the brain and surrounding structures were obtained without intravenous contrast. COMPARISON:  CT head, CTA head and neck yesterday. FINDINGS: Brain: Confluent restricted diffusion from the posterior temporal lobe through the lateral left occipital lobe tracking to the left occipital pole, an area of 9.5 cm (series 2, image 29) corresponding to the CT finding yesterday.  Multifocal petechial hemorrhage (series 7, image 61) and confluent cytotoxic edema but no malignant hemorrhagic transformation and only mild mass effect on the left lateral ventricle without midline shift. Superimposed small cortical restricted diffusion at the right superior parietal lobe (series 2, image 44). Mild if any associated T2 and FLAIR hyperintensity. No hemorrhage. No other restricted diffusion. No midline shift, evidence of mass lesion, ventriculomegaly, extra-axial collection. Patchy and nodular superimposed bilateral cerebral white matter T2 and FLAIR hyperintensity in a nonspecific configuration. The corpus callosum and temporal lobes are spared. No chronic cortical encephalomalacia. But there do appear to be several chronic microhemorrhages in the left lentiform nuclei (series 7, images 52 and 56). No other chronic cerebral blood products. Elsewhere the deep gray nuclei, brainstem and cerebellum are within normal limits. Cervicomedullary junction and pituitary are within normal limits. Vascular: Major intracranial vascular flow voids are preserved. Skull and upper cervical spine: Negative. Sinuses/Orbits: Disconjugate gaze otherwise negative orbits. Paranasal sinuses and mastoids are stable and well pneumatized. Other: None. IMPRESSION: 1. Large acute infarct in the posterior left hemisphere with petechial hemorrhage and cytotoxic edema, but no malignant hemorrhagic transformation or significant intracranial mass effect. 2. Solitary small acute cortical infarct superimposed in the  superior right parietal lobe. 3. Chronic micro-hemorrhages in the left lentiform nuclei. Bilateral periventricular white matter signal changes which may therefore be chronic small vessel disease related. Electronically Signed   By: Odessa Fleming M.D.   On: 07/20/2020 12:00   DG Chest Portable 1 View  Result Date: 07/19/2020 CLINICAL DATA:  Fever, altered level of consciousness EXAM: PORTABLE CHEST 1 VIEW COMPARISON:  07/17/2020 FINDINGS: Single frontal view of the chest demonstrates an enlarged cardiac silhouette. Increased central vascular congestion, with interval development of bilateral interstitial prominence. No large effusion or pneumothorax. No acute bony abnormalities. IMPRESSION: 1. Worsening volume status, with mild interstitial edema on today's exam. Electronically Signed   By: Sharlet Salina M.D.   On: 07/19/2020 18:49   DG Chest Port 1 View  Result Date: 07/16/2020 CLINICAL DATA:  Intermittent sharp left-sided chest pain, short of breath, productive cough EXAM: PORTABLE CHEST 1 VIEW COMPARISON:  05/29/2018 FINDINGS: The heart size and mediastinal contours are within normal limits. Both lungs are clear. The visualized skeletal structures are unremarkable. IMPRESSION: No active disease. Electronically Signed   By: Sharlet Salina M.D.   On: 07/16/2020 17:21   ECHOCARDIOGRAM COMPLETE  Result Date: 07/20/2020    ECHOCARDIOGRAM REPORT   Patient Name:   PHAROAH GOGGINS Date of Exam: 07/20/2020 Medical Rec #:  [id number]     Height:       73.0 in Accession #:    [id number]    Weight:       262.3 lb Date of Birth:  09-24-1975     BSA:          2.415 m Patient Age:    44 years      BP:           153/106 mmHg Patient Gender: M             HR:           108 bpm. Exam Location:  Inpatient Procedure: 2D Echo, Color Doppler, Cardiac Doppler and Intracardiac            Opacification Agent Indications:    CHF-Acute Systolic I50.21  History:        Patient has prior history of Echocardiogram examinations, most  recent 01/04/2017. Risk Factors:Hypertension, Dyslipidemia and                 Diabetes.  Sonographer:    Eulah Pont RDCS Referring Phys: [id number] VASUNDHRA RATHORE IMPRESSIONS  1. A small (8 mm diameter), almost spherical, slightly mobile ventricular apical thrombus is seen. Left ventricular ejection fraction, by estimation, is 20 to 25%. The left ventricle has severely decreased function. The left ventricle demonstrates global hypokinesis. The left ventricular internal cavity size was moderately dilated. Indeterminate diastolic filling due to E-A fusion.  2. Right ventricular systolic function is mildly reduced. The right ventricular size is normal. Tricuspid regurgitation signal is inadequate for assessing PA pressure.  3. Left atrial size was moderately dilated.  4. A small pericardial effusion is present. The pericardial effusion is localized near the right atrium.  5. The mitral valve is normal in structure. No evidence of mitral valve regurgitation. No evidence of mitral stenosis.  6. The aortic valve is tricuspid. Aortic valve regurgitation is not visualized. No aortic stenosis is present.  7. The inferior vena cava is normal in size with greater than 50% respiratory variability, suggesting right atrial pressure of 3 mmHg. Comparison(s): Prior images unable to be directly viewed, comparison made by report only. The left ventricular function is worsened. A new apical LV thrombus is seen. FINDINGS  Left Ventricle: A small (8 mm diameter), almost spherical, slightly mobile ventricular apical thrombus is seen. Left ventricular ejection fraction, by estimation, is 20 to 25%. The left ventricle has severely decreased function. The left ventricle demonstrates global hypokinesis. Definity contrast agent was given IV to delineate the left ventricular endocardial borders. The left ventricular internal cavity size was moderately dilated. There is no left ventricular hypertrophy. Indeterminate diastolic  filling due to E-A fusion. Right Ventricle: The right ventricular size is normal. No increase in right ventricular wall thickness. Right ventricular systolic function is mildly reduced. Tricuspid regurgitation signal is inadequate for assessing PA pressure. Left Atrium: Left atrial size was moderately dilated. Right Atrium: Right atrial size was normal in size. Pericardium: A small pericardial effusion is present. The pericardial effusion is localized near the right atrium. Mitral Valve: The mitral valve is normal in structure. No evidence of mitral valve regurgitation. No evidence of mitral valve stenosis. Tricuspid Valve: The tricuspid valve is normal in structure. Tricuspid valve regurgitation is not demonstrated. Aortic Valve: The aortic valve is tricuspid. Aortic valve regurgitation is not visualized. No aortic stenosis is present. Pulmonic Valve: The pulmonic valve was grossly normal. Pulmonic valve regurgitation is not visualized. Aorta: The aortic root and ascending aorta are structurally normal, with no evidence of dilitation. Venous: The inferior vena cava is normal in size with greater than 50% respiratory variability, suggesting right atrial pressure of 3 mmHg. IAS/Shunts: No atrial level shunt detected by color flow Doppler.  LEFT VENTRICLE PLAX 2D LVIDd:         6.60 cm LVIDs:         5.40 cm LV PW:         1.10 cm LV IVS:        0.90 cm LVOT diam:     2.10 cm LV SV:         37 LV SV Index:   16 LVOT Area:     3.46 cm  RIGHT VENTRICLE RV S prime:     7.89 cm/s TAPSE (M-mode): 2.0 cm LEFT ATRIUM              Index  RIGHT ATRIUM           Index LA diam:        4.20 cm  1.74 cm/m  RA Area:     17.50 cm LA Vol (A2C):   98.6 ml  40.83 ml/m RA Volume:   46.80 ml  19.38 ml/m LA Vol (A4C):   101.0 ml 41.83 ml/m LA Biplane Vol: 103.0 ml 42.66 ml/m  AORTIC VALVE LVOT Vmax:   76.50 cm/s LVOT Vmean:  54.750 cm/s LVOT VTI:    0.108 m  AORTA Ao Root diam: 3.60 cm Ao Asc diam:  3.10 cm  SHUNTS Systemic  VTI:  0.11 m Systemic Diam: 2.10 cm Rachelle Hora Croitoru MD Electronically signed by Thurmon Fair MD Signature Date/Time: 07/20/2020/1:15:21 PM    Final

## 2020-07-24 ENCOUNTER — Other Ambulatory Visit: Payer: Self-pay

## 2020-07-24 ENCOUNTER — Encounter (HOSPITAL_COMMUNITY): Payer: Self-pay | Admitting: Internal Medicine

## 2020-07-24 DIAGNOSIS — I5020 Unspecified systolic (congestive) heart failure: Secondary | ICD-10-CM | POA: Diagnosis not present

## 2020-07-24 DIAGNOSIS — U071 COVID-19: Secondary | ICD-10-CM | POA: Diagnosis not present

## 2020-07-24 LAB — CBC WITH DIFFERENTIAL/PLATELET
Abs Immature Granulocytes: 0.04 10*3/uL (ref 0.00–0.07)
Basophils Absolute: 0 10*3/uL (ref 0.0–0.1)
Basophils Relative: 0 %
Eosinophils Absolute: 0 10*3/uL (ref 0.0–0.5)
Eosinophils Relative: 0 %
HCT: 41.1 % (ref 39.0–52.0)
Hemoglobin: 13.3 g/dL (ref 13.0–17.0)
Immature Granulocytes: 0 %
Lymphocytes Relative: 21 %
Lymphs Abs: 2.2 10*3/uL (ref 0.7–4.0)
MCH: 25.3 pg — ABNORMAL LOW (ref 26.0–34.0)
MCHC: 32.4 g/dL (ref 30.0–36.0)
MCV: 78.3 fL — ABNORMAL LOW (ref 80.0–100.0)
Monocytes Absolute: 0.9 10*3/uL (ref 0.1–1.0)
Monocytes Relative: 8 %
Neutro Abs: 7.5 10*3/uL (ref 1.7–7.7)
Neutrophils Relative %: 71 %
Platelets: 301 10*3/uL (ref 150–400)
RBC: 5.25 MIL/uL (ref 4.22–5.81)
RDW: 13.1 % (ref 11.5–15.5)
WBC: 10.6 10*3/uL — ABNORMAL HIGH (ref 4.0–10.5)
nRBC: 0 % (ref 0.0–0.2)

## 2020-07-24 LAB — COMPREHENSIVE METABOLIC PANEL
ALT: 21 U/L (ref 0–44)
AST: 19 U/L (ref 15–41)
Albumin: 3 g/dL — ABNORMAL LOW (ref 3.5–5.0)
Alkaline Phosphatase: 49 U/L (ref 38–126)
Anion gap: 8 (ref 5–15)
BUN: 21 mg/dL — ABNORMAL HIGH (ref 6–20)
CO2: 24 mmol/L (ref 22–32)
Calcium: 8.8 mg/dL — ABNORMAL LOW (ref 8.9–10.3)
Chloride: 103 mmol/L (ref 98–111)
Creatinine, Ser: 0.97 mg/dL (ref 0.61–1.24)
GFR, Estimated: >60 mL/min (ref >60)
Glucose, Bld: 145 mg/dL — ABNORMAL HIGH (ref 70–99)
Potassium: 3.6 mmol/L (ref 3.5–5.1)
Sodium: 135 mmol/L (ref 135–145)
Total Bilirubin: 0.6 mg/dL (ref 0.3–1.2)
Total Protein: 6.4 g/dL — ABNORMAL LOW (ref 6.5–8.1)

## 2020-07-24 LAB — GLUCOSE, CAPILLARY
Glucose-Capillary: 166 mg/dL — ABNORMAL HIGH (ref 70–99)
Glucose-Capillary: 189 mg/dL — ABNORMAL HIGH (ref 70–99)
Glucose-Capillary: 285 mg/dL — ABNORMAL HIGH (ref 70–99)
Glucose-Capillary: 310 mg/dL — ABNORMAL HIGH (ref 70–99)

## 2020-07-24 LAB — CULTURE, BLOOD (ROUTINE X 2)
Culture: NO GROWTH
Special Requests: ADEQUATE

## 2020-07-24 LAB — PROTIME-INR
INR: 1.3 — ABNORMAL HIGH (ref 0.8–1.2)
Prothrombin Time: 15.7 seconds — ABNORMAL HIGH (ref 11.4–15.2)

## 2020-07-24 LAB — C-REACTIVE PROTEIN: CRP: 2.8 mg/dL — ABNORMAL HIGH (ref <1.0)

## 2020-07-24 LAB — HEPARIN LEVEL (UNFRACTIONATED)
Heparin Unfractionated: 0.28 IU/mL — ABNORMAL LOW (ref 0.30–0.70)
Heparin Unfractionated: 0.29 IU/mL — ABNORMAL LOW (ref 0.30–0.70)

## 2020-07-24 LAB — D-DIMER, QUANTITATIVE: D-Dimer, Quant: 0.43 ug/mL-FEU (ref 0.00–0.50)

## 2020-07-24 MED ORDER — WARFARIN SODIUM 5 MG PO TABS
10.0000 mg | ORAL_TABLET | Freq: Once | ORAL | Status: AC
  Administered 2020-07-24: 10 mg via ORAL
  Filled 2020-07-24: qty 2

## 2020-07-24 MED ORDER — PANTOPRAZOLE SODIUM 40 MG PO TBEC
40.0000 mg | DELAYED_RELEASE_TABLET | Freq: Every day | ORAL | Status: DC
  Administered 2020-07-24 – 2020-07-28 (×5): 40 mg via ORAL
  Filled 2020-07-24 (×5): qty 1

## 2020-07-24 MED ORDER — LOSARTAN POTASSIUM 50 MG PO TABS
25.0000 mg | ORAL_TABLET | Freq: Every day | ORAL | Status: DC
  Administered 2020-07-24 – 2020-07-25 (×2): 25 mg via ORAL
  Filled 2020-07-24 (×2): qty 1

## 2020-07-24 NOTE — Progress Notes (Signed)
   Patient Name: Rodney Kane Date of Encounter: 07/24/2020  Progress reviewed.  NO new recommendations from yesterday    Pt   Continues coumadin load Follow BP  Add low dose losartan if stable/high  25 mg daily     Signed, Dietrich Pates, MD  07/24/2020, 11:40 AM

## 2020-07-24 NOTE — Progress Notes (Signed)
PROGRESS NOTE                                                                             PROGRESS NOTE                                                                                                                                                                                                             Patient Demographics:    Rodney Kane, is a 45 y.o. male, DOB - [birth year], YQI:[id number]  Outpatient Primary MD for the patient is Kallie Locks, FNP    LOS - 5  Admit date - 07/19/2020    Chief Complaint  Patient presents with  . Altered Mental Status       Brief Narrative     Rodney Kane is a 45 y.o. 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  Work-up was significant for acute CVA, renal thrombosis, and COVID-19 infection.   Subjective:    Rodney Kane today soft denies any complaints, no significant events as discussed with staff overnight.    Assessment  & Plan :    Principal Problem:   COVID-19 virus infection Active Problems:   CHF exacerbation (HCC)   Severe sepsis (HCC)   Sinus tachycardia   Acute CVA (cerebrovascular accident) (HCC)     SpO2: 94 %  Recent Labs  Lab 07/19/20 1831 07/19/20 1853 07/19/20 1907 07/19/20 2053 07/19/20 2107 07/20/20 0416 07/21/20 0512 07/22/20 0208 07/22/20 1421 07/23/20 0349 07/24/20 0422  WBC  --  6.5  --   --   --   --  5.3 7.2  --  9.1 10.6*  PLT  --  242  --   --   --   --  263 300  --  302 301  CRP  --   --   --   --  12.4*  --  12.5* 7.6*  --  4.6* 2.8*  BNP  --   --  1,619.5*  --   --   --   --   --   --   --   --   DDIMER  --   --   --   --  1.27*  --  1.43* 0.71*  --  0.48 0.43  PROCALCITON   --   --   --   --  0.68  --   --   --   --   --   --   AST  --  22  --   --   --   --  21 31  --  25 19  ALT  --  21  --   --   --   --  18 23  --  24 21  ALKPHOS  --  48  --   --   --   --  36* 56  --  53 49  BILITOT  --  1.0  --   --   --   --  0.9 0.4  --  0.7 0.6  ALBUMIN  --  3.6  --   --   --   --  3.0* 3.0*  --  3.0* 3.0*  INR  --  1.2  --   --   --   --   --   --  1.2 1.2 1.3*  LATICACIDVEN  --  2.2*  --  2.6*  --  3.4*  --   --   --   --   --   SARSCOV2NAA POSITIVE*  --   --   --   --   --   --   --   --   --   --        ABG     Component Value Date/Time   PHART 7.458 (H) 01/05/2017 0817   PCO2ART 39.9 01/05/2017 0817   PO2ART 70.0 (L) 01/05/2017 0817   HCO3 25.6 07/19/2020 1921   TCO2 27 07/19/2020 1921   O2SAT 54.0 07/19/2020 1921     Acute CVA:  - Patient with  expressive aphasia for several days and staggering gait. -MRI brain cannot for large acute infarct in the posterior left hemisphere with petechial hemorrhage and cytotoxic edema, but no malignant hemorrhagic transformation or significant intracranial mass effect, solitary small acute cortical infarcts superimposed in the superior right parietal lobe, and chronic microhemorrhages . -  CT head showing acute/subacute infarct in the left posterior parietal lobe.  Mild mass-effect without midline shift.  No acute intracranial hemorrhage.  CTA head and neck negative for LVO. -Significant for low EF 20 to 25%, and apical thrombus, most likely the cause of his acute CVA, started on heparin drip.  He is started on warfarin as well. -Hemoglobin A1c 7.7, LDL 102 -Atorvastatin 80 mg daily -PT, OT, speech therapy    COVID-19 viral infection: -Patient is unvaccinated. -No pneumonia on imaging, no hypoxia.he is very likely early in his COVID 19 infection to have any symptoms's. - started empirically on remdesivir and steroids at this point.  remains stable with no hypoxia -Continue to trend inflammatory  markers -Procalcitonin of 0.68 will narrow from vancomycin and Zosyn to Rocephin and doxycycline.   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 -Repeat 2D echo this admission with low  EF 20 to 25%, and apical thrombus, low EF and thrombus most likely in the setting of known chronic CHF, unlikely Covid at this point contributing given patient is still early in his Covid disease, with no evidence of pneumonia or significant coagulopathy yet, so low EF most likely progression of his known nonischemic cardiomyopathy with noncompliance with medications, and apical thrombus are related to that and not due to Covid. -Cardiology input greatly appreciated, on Coreg, recommendation to add losartan instead of Entresto to avoid low blood pressure in the setting of acute CVA, overall blood pressure is acceptable, so I have started on losartan. - on heparin GTT, he is started on warfarin for his mural thrombus.  Target INR 2-2.5  Mild troponin elevation:  -Most likely in the setting of acute CVA, and atrial thrombus .  Mild hyponatremia:  -L solved with hydration  Hypertension -Resumed home Coreg given persistent tachycardia.  Hold any other antihypertensives at this time to allow permissive hypertension in the setting of stroke which is possibly acute.  Hyperlipidemia -Continue statin, dose increased to full intensity statin given stroke  Hypokalemia - repleted  Insulin-dependent type 2 diabetes:  -A1c is 7.7. -BG remains elevated, will resume on home dose glipizide, meanwhile continue with Tradjenta, Levemir and insulin sliding scale  QT prolongation on EKG -Cardiac monitoring -Keep potassium above 4 and magnesium above 2 -Repeat EKG in a.m. -Avoid QT prolonging drugs if possible.  Medications noncompliance -Will be counseled  Condition - Extremely Guarded  Family Communication  : D/W  daughter by phone 07/21/2020, 07/22/2020  Code Status :  Full  Consults  :  Neurology, cardiology  Procedures  :  none  Disposition Plan  :    Status is: Inpatient  Remains inpatient appropriate because:IV treatments appropriate due to intensity of illness or inability to take PO   Dispo: The patient is from: Home              Anticipated d/c is to: SNF              Anticipated d/c date is: 2 days              Patient currently is not medically stable to d/c.  Remains on heparin GTT.      DVT Prophylaxis  :  Heparin GTT  Lab Results  Component Value Date   PLT 301 07/24/2020    Diet :  Diet Order            Diet Carb Modified Fluid consistency: Thin; Room service appropriate? Yes  Diet effective now                  Inpatient Medications  Scheduled Meds: . vitamin C  500 mg Oral Daily  . atorvastatin  80 mg Oral Daily  . carvedilol  12.5 mg Oral BID WC  . cholecalciferol  1,000 Units Oral Daily  . dexamethasone (DECADRON) injection  6 mg Intravenous Daily  . doxycycline  100 mg Oral Q12H  . glipiZIDE  10 mg Oral BID AC  . insulin aspart  0-5 Units Subcutaneous QHS  . insulin aspart  0-9 Units Subcutaneous TID WC  . insulin glargine  5 Units Subcutaneous Daily  . linagliptin  5 mg Oral Daily  . pantoprazole  40 mg Oral Daily  . warfarin  10 mg Oral ONCE-1600  . Warfarin - Pharmacist Dosing Inpatient   Does not apply q1600  . zinc sulfate  220 mg Oral Daily  Continuous Infusions: . cefTRIAXone (ROCEPHIN)  IV 1 g (07/24/20 1436)  . heparin 1,400 Units/hr (07/24/20 0904)   PRN Meds:.acetaminophen  Antibiotics  :    Anti-infectives (From admission, onward)   Start     Dose/Rate Route Frequency Ordered Stop   07/21/20 1000  remdesivir 100 mg in sodium chloride 0.9 % 100 mL IVPB       "Followed by" Linked Group Details   100 mg 200 mL/hr over 30 Minutes Intravenous Daily 07/20/20 0055 07/24/20 1030   07/20/20 1800  vancomycin (VANCOREADY) IVPB 1500  mg/300 mL  Status:  Discontinued        1,500 mg 150 mL/hr over 120 Minutes Intravenous Every 12 hours 07/20/20 0628 07/20/20 1425   07/20/20 1500  cefTRIAXone (ROCEPHIN) 1 g in sodium chloride 0.9 % 100 mL IVPB        1 g 200 mL/hr over 30 Minutes Intravenous Every 24 hours 07/20/20 1425 07/24/20 2359   07/20/20 1430  doxycycline (VIBRA-TABS) tablet 100 mg        100 mg Oral Every 12 hours 07/20/20 1425 07/24/20 2359   07/20/20 0700  vancomycin (VANCOREADY) IVPB 2000 mg/400 mL        2,000 mg 200 mL/hr over 120 Minutes Intravenous  Once 07/20/20 0628 07/20/20 1355   07/20/20 0630  piperacillin-tazobactam (ZOSYN) IVPB 3.375 g  Status:  Discontinued        3.375 g 12.5 mL/hr over 240 Minutes Intravenous Every 8 hours 07/20/20 0628 07/20/20 1425   07/20/20 0200  remdesivir 200 mg in sodium chloride 0.9% 250 mL IVPB       "Followed by" Linked Group Details   200 mg 580 mL/hr over 30 Minutes Intravenous Once 07/20/20 0055 07/20/20 0238   07/19/20 1900  cefTRIAXone (ROCEPHIN) 2 g in sodium chloride 0.9 % 100 mL IVPB        2 g 200 mL/hr over 30 Minutes Intravenous  Once 07/19/20 1856 07/19/20 2040        Rodney Kane M.D on 07/24/2020 at 2:50 PM  To page go to [email or web address]   [facility] -  Office  [phone]      Objective:   Vitals:   07/23/20 2107 07/24/20 0500 07/24/20 0742 07/24/20 1320  BP: (!) 148/104   138/90  Pulse: 85   93  Resp: 16   20  Temp: 97.8 F (36.6 C)   99 F (37.2 C)  TempSrc: Axillary   Oral  SpO2: 97%  98% 94%  Weight:  122.9 kg    Height:        Wt Readings from Last 3 Encounters:  07/24/20 122.9 kg  07/16/20 119 kg  02/23/20 119.7 kg     Intake/Output Summary (Last 24 hours) at 07/24/2020 1450 Last data filed at 07/24/2020 1220 Gross per 24 hour  Intake 1487.42 ml  Output -  Net 1487.42 ml     Physical Exam  Awake Alert, patient is with significant receptive and expressive aphasia Symmetrical Chest wall movement, Good  air movement bilaterally, CTAB RRR,No Gallops,Rubs or new Murmurs, No Parasternal Heave +ve B.Sounds, Abd Soft, No tenderness, No rebound - guarding or rigidity. No Cyanosis, Clubbing or edema, No new Rash or bruise      Data Review:    CBC Recent Labs  Lab 07/19/20 1853 07/19/20 1921 07/21/20 0512 07/22/20 0208 07/23/20 0349 07/24/20 0422  WBC 6.5  --  5.3 7.2 9.1 10.6*  HGB 14.1 15.3 13.5 13.5 13.0 13.3  HCT 44.2 45.0 41.3 40.5 40.3 41.1  PLT 242  --  263 300 302 301  MCV 79.2*  --  77.3* 77.9* 77.9* 78.3*  MCH 25.3*  --  25.3* 26.0 25.1* 25.3*  MCHC 31.9  --  32.7 33.3 32.3 32.4  RDW 12.8  --  13.0 13.2 13.1 13.1  LYMPHSABS 0.8  --  1.7 1.7 1.6 2.2  MONOABS 0.7  --  0.6 0.7 0.6 0.9  EOSABS 0.0  --  0.0 0.0 0.0 0.0  BASOSABS 0.0  --  0.0 0.0 0.0 0.0    Recent Labs  Lab 07/19/20 1853 07/19/20 1855 07/19/20 1856 07/19/20 1907 07/19/20 1921 07/19/20 2053 07/19/20 2107 07/20/20 0416 07/21/20 0512 07/22/20 0208 07/22/20 1421 07/23/20 0349 07/24/20 0422  NA 130*  --   --   --  133*  --   --   --  134* 137  --  134* 135  K 3.8  --   --   --  3.9  --   --   --  3.1* 4.3  --  3.7 3.6  CL 94*  --   --   --   --   --   --   --  97* 102  --  102 103  CO2 21*  --   --   --   --   --   --   --  25 23  --  22 24  GLUCOSE 182*  --   --   --   --   --   --   --  186* 244*  --  205* 145*  BUN 15  --   --   --   --   --   --   --  22* 27*  --  22* 21*  CREATININE 1.19  --   --   --   --   --   --   --  1.15 1.11  --  1.06 0.97  CALCIUM 9.0  --   --   --   --   --   --   --  8.3* 8.9  --  8.7* 8.8*  AST 22  --   --   --   --   --   --   --  21 31  --  25 19  ALT 21  --   --   --   --   --   --   --  18 23  --  24 21  ALKPHOS 48  --   --   --   --   --   --   --  36* 56  --  53 49  BILITOT 1.0  --   --   --   --   --   --   --  0.9 0.4  --  0.7 0.6  ALBUMIN 3.6  --   --   --   --   --   --   --  3.0* 3.0*  --  3.0* 3.0*  MG 1.8  --   --   --   --   --   --   --   --  2.0  --    --   --   CRP  --   --   --   --   --   --  12.4*  --  12.5* 7.6*  --  4.6* 2.8*  DDIMER  --   --   --   --   --   --  1.27*  --  1.43* 0.71*  --  0.48 0.43  PROCALCITON  --   --   --   --   --   --  0.68  --   --   --   --   --   --   LATICACIDVEN 2.2*  --   --   --   --  2.6*  --  3.4*  --   --   --   --   --   INR 1.2  --   --   --   --   --   --   --   --   --  1.2 1.2 1.3*  TSH  --  1.447  --   --   --   --   --   --   --   --   --   --   --   HGBA1C  --   --   --   --   --   --   --  7.7*  --   --   --   --   --   AMMONIA  --   --  <9*  --   --   --   --   --   --   --   --   --   --   BNP  --   --   --  1,619.5*  --   --   --   --   --   --   --   --   --     ------------------------------------------------------------------------------------------------------------------ No results for input(s): CHOL, HDL, LDLCALC, TRIG, CHOLHDL, LDLDIRECT in the last 72 hours.  Lab Results  Component Value Date   HGBA1C 7.7 (H) 07/20/2020   ------------------------------------------------------------------------------------------------------------------ No results for input(s): TSH, T4TOTAL, T3FREE, THYROIDAB in the last 72 hours.  Invalid input(s): FREET3  Cardiac Enzymes No results for input(s): CKMB, TROPONINI, MYOGLOBIN in the last 168 hours.  Invalid input(s): CK ------------------------------------------------------------------------------------------------------------------    Component Value Date/Time   BNP 1,619.5 (H) 07/19/2020 1907    Micro Results Recent Results (from the past 240 hour(s))  Resp Panel by RT-PCR (Flu A&B, Covid) Nasopharyngeal Swab     Status: None   Collection Time: 07/16/20 10:29 PM   Specimen: Nasopharyngeal Swab; Nasopharyngeal(NP) swabs in vial transport medium  Result Value Ref Range Status   SARS Coronavirus 2 by RT PCR NEGATIVE NEGATIVE Final    Comment: (NOTE) SARS-CoV-2 target nucleic acids are NOT DETECTED.  The SARS-CoV-2 RNA is generally  detectable in upper respiratory specimens during the acute phase of infection. The lowest concentration of SARS-CoV-2 viral copies this assay can detect is 138 copies/mL. A negative result does not preclude SARS-Cov-2 infection and should not be used as the sole basis for treatment or other patient management decisions. A negative result may occur with  improper specimen collection/handling, submission of specimen other than nasopharyngeal swab, presence of viral mutation(s) within the areas targeted by this assay, and inadequate number of viral copies(<138 copies/mL). A negative result must be combined with clinical observations, patient history, and epidemiological information. The expected result is Negative.  Fact Sheet for Patients:  BloggerCourse.com  Fact Sheet for Healthcare Providers:  SeriousBroker.it  This test is no t yet approved or cleared by the [place] FDA and  has been authorized for detection and/or diagnosis of SARS-CoV-2 by FDA under an Emergency Use Authorization (EUA). This EUA will remain  in effect (  meaning this test can be used) for the duration of the COVID-19 declaration under Section 564(b)(1) of the Act, 21 U.S.C.section 360bbb-3(b)(1), unless the authorization is terminated  or revoked sooner.       Influenza A by PCR NEGATIVE NEGATIVE Final   Influenza B by PCR NEGATIVE NEGATIVE Final    Comment: (NOTE) The Xpert Xpress SARS-CoV-2/FLU/RSV plus assay is intended as an aid in the diagnosis of influenza from Nasopharyngeal swab specimens and should not be used as a sole basis for treatment. Nasal washings and aspirates are unacceptable for Xpert Xpress SARS-CoV-2/FLU/RSV testing.  Fact Sheet for Patients: BloggerCourse.com  Fact Sheet for Healthcare Providers: SeriousBroker.it  This test is not yet approved or cleared by the [place] FDA  and has been authorized for detection and/or diagnosis of SARS-CoV-2 by FDA under an Emergency Use Authorization (EUA). This EUA will remain in effect (meaning this test can be used) for the duration of the COVID-19 declaration under Section 564(b)(1) of the Act, 21 U.S.C. section 360bbb-3(b)(1), unless the authorization is terminated or revoked.  Performed at [facility], [address], [place], [place] [address]   Resp Panel by RT-PCR (Flu A&B, Covid) Nasopharyngeal Swab     Status: Abnormal   Collection Time: 07/19/20  6:31 PM   Specimen: Nasopharyngeal Swab; Nasopharyngeal(NP) swabs in vial transport medium  Result Value Ref Range Status   SARS Coronavirus 2 by RT PCR POSITIVE (A) NEGATIVE Final    Comment: RESULT CALLED TO, READ BACK BY AND VERIFIED WITH: Rema Jasmine, RN 07/19/20 at 1947 sk  (NOTE) SARS-CoV-2 target nucleic acids are DETECTED.  The SARS-CoV-2 RNA is generally detectable in upper respiratory specimens during the acute phase of infection. Positive results are indicative of the presence of the identified virus, but do not rule out bacterial infection or co-infection with other pathogens not detected by the test. Clinical correlation with patient history and other diagnostic information is necessary to determine patient infection status. The expected result is Negative.  Fact Sheet for Patients: BloggerCourse.com  Fact Sheet for Healthcare Providers: SeriousBroker.it  This test is not yet approved or cleared by the [place] FDA and  has been authorized for detection and/or diagnosis of SARS-CoV-2 by FDA under an Emergency Use Authorization (EUA).  This EUA will remain in effect (meaning this test can  be used) for the duration of  the COVID-19 declaration under Section 564(b)(1) of the Act, 21 U.S.C. section 360bbb-3(b)(1), unless the authorization is terminated or revoked sooner.     Influenza  A by PCR NEGATIVE NEGATIVE Final   Influenza B by PCR NEGATIVE NEGATIVE Final    Comment: (NOTE) The Xpert Xpress SARS-CoV-2/FLU/RSV plus assay is intended as an aid in the diagnosis of influenza from Nasopharyngeal swab specimens and should not be used as a sole basis for treatment. Nasal washings and aspirates are unacceptable for Xpert Xpress SARS-CoV-2/FLU/RSV testing.  Fact Sheet for Patients: BloggerCourse.com  Fact Sheet for Healthcare Providers: SeriousBroker.it  This test is not yet approved or cleared by the [place] FDA and has been authorized for detection and/or diagnosis of SARS-CoV-2 by FDA under an Emergency Use Authorization (EUA). This EUA will remain in effect (meaning this test can be used) for the duration of the COVID-19 declaration under Section 564(b)(1) of the Act, 21 U.S.C. section 360bbb-3(b)(1), unless the authorization is terminated or revoked.  Performed at [facility], [address] [place], [place] [address]   Blood Culture (routine x 2)  Status: None   Collection Time: 07/19/20  6:58 PM   Specimen: BLOOD  Result Value Ref Range Status   Specimen Description BLOOD LEFT ANTECUBITAL  Final   Special Requests   Final    BOTTLES DRAWN AEROBIC AND ANAEROBIC Blood Culture adequate volume   Culture   Final    NO GROWTH 5 DAYS Performed at [facility], [address], [place], [place] [address]    Report Status 07/24/2020 FINAL  Final    Radiology Reports CT Angio Head W or Wo Contrast  Result Date: 07/19/2020 CLINICAL DATA:  Stroke-like symptoms. Expressive aphasia, headache, confusion EXAM: CT ANGIOGRAPHY HEAD AND NECK TECHNIQUE: Multidetector CT imaging of the head and neck was performed using the standard protocol during bolus administration of intravenous contrast. Multiplanar CT image reconstructions and MIPs were obtained to evaluate the vascular anatomy. Carotid  stenosis measurements (when applicable) are obtained utilizing NASCET criteria, using the distal internal carotid diameter as the denominator. CONTRAST:  83mL OMNIPAQUE IOHEXOL 350 MG/ML SOLN COMPARISON:  CT head 07/19/2020 FINDINGS: CTA NECK FINDINGS Aortic arch: Normal aortic arch and proximal great vessels. Bovine branching pattern. Right carotid system: Normal right carotid. Negative for stenosis or dissection. Left carotid system: Normal left carotid. Negative for stenosis or dissection. Vertebral arteries: Both vertebral arteries are normal and patent to the basilar. Right vertebral artery dominant. Skeleton: No acute skeletal abnormality. Other neck: Negative for mass or adenopathy in the neck. Upper chest: Small bilateral effusions. Patchy nodular airspace disease left upper lobe, not present on CT chest 07/17/2020. Possible pneumonia or edema. Review of the MIP images confirms the above findings CTA HEAD FINDINGS Anterior circulation: Normal cavernous carotid. Right anterior and middle cerebral arteries widely patent. Left anterior cerebral artery patent. Left M1 segment widely patent. Left MCA bifurcation normal. No large vessel occlusion. There is an area of hypoperfusion in the left parietal lobe corresponding to the hypodensity. This is consistent with recent infarct with distal MCA occlusion Posterior circulation: Both vertebral arteries patent to the basilar. PICA patent bilaterally. Basilar widely patent. Posterior circulation normal without stenosis or large vessel occlusion. Venous sinuses: Normal venous enhancement. Anatomic variants: None Review of the MIP images confirms the above findings IMPRESSION: 1. Normal carotid and vertebral arteries in the neck 2. No intracranial large vessel occlusion. There is a small area of hypoperfusion in the left parietal lobe consistent with subacute infarct. This area shows hypodensity on CT. Electronically Signed   By: Marlan Palau M.D.   On: 07/19/2020  20:58   DG Abd 1 View  Result Date: 07/20/2020 CLINICAL DATA:  Pre MRI EXAM: ABDOMEN - 1 VIEW COMPARISON:  CT 02/24/2020 FINDINGS: No implantable device seen. Patient's metallic belt buckle and zipper noted. Nonobstructive bowel gas pattern. No organomegaly or free air. IMPRESSION: No visible implantable device. Electronically Signed   By: Charlett Nose M.D.   On: 07/20/2020 07:35   CT Head Wo Contrast  Result Date: 07/19/2020 CLINICAL DATA:  Altered mental status. Stroke-like symptoms for 4 days. Expressive aphasia, headache, and confusion. EXAM: CT HEAD WITHOUT CONTRAST TECHNIQUE: Contiguous axial images were obtained from the base of the skull through the vertex without intravenous contrast. COMPARISON:  None. FINDINGS: Brain: Poorly defined area of low attenuation change extending to the cortical surface and involving the left posterior parietal lobe. This is consistent with acute or subacute infarct. Mild mass effect with effacement of sulci and left lateral ventricle. No midline shift. No abnormal extra-axial fluid collections. No acute  intracranial hemorrhage. No ventricular dilatation. Vascular: Mild intracranial arterial vascular calcifications. Skull: Calvarium appears intact. Sinuses/Orbits: Paranasal sinuses and mastoid air cells are clear. Other: None. IMPRESSION: Low-attenuation in the left posterior parietal lobe consistent with acute or subacute infarct. Mild mass effect without midline shift. No acute intracranial hemorrhage. Electronically Signed   By: Lucienne Capers M.D.   On: 07/19/2020 19:36   CT Angio Neck W and/or Wo Contrast  Result Date: 07/19/2020 CLINICAL DATA:  Stroke-like symptoms. Expressive aphasia, headache, confusion EXAM: CT ANGIOGRAPHY HEAD AND NECK TECHNIQUE: Multidetector CT imaging of the head and neck was performed using the standard protocol during bolus administration of intravenous contrast. Multiplanar CT image reconstructions and MIPs were obtained to  evaluate the vascular anatomy. Carotid stenosis measurements (when applicable) are obtained utilizing NASCET criteria, using the distal internal carotid diameter as the denominator. CONTRAST:  8mL OMNIPAQUE IOHEXOL 350 MG/ML SOLN COMPARISON:  CT head 07/19/2020 FINDINGS: CTA NECK FINDINGS Aortic arch: Normal aortic arch and proximal great vessels. Bovine branching pattern. Right carotid system: Normal right carotid. Negative for stenosis or dissection. Left carotid system: Normal left carotid. Negative for stenosis or dissection. Vertebral arteries: Both vertebral arteries are normal and patent to the basilar. Right vertebral artery dominant. Skeleton: No acute skeletal abnormality. Other neck: Negative for mass or adenopathy in the neck. Upper chest: Small bilateral effusions. Patchy nodular airspace disease left upper lobe, not present on CT chest 07/17/2020. Possible pneumonia or edema. Review of the MIP images confirms the above findings CTA HEAD FINDINGS Anterior circulation: Normal cavernous carotid. Right anterior and middle cerebral arteries widely patent. Left anterior cerebral artery patent. Left M1 segment widely patent. Left MCA bifurcation normal. No large vessel occlusion. There is an area of hypoperfusion in the left parietal lobe corresponding to the hypodensity. This is consistent with recent infarct with distal MCA occlusion Posterior circulation: Both vertebral arteries patent to the basilar. PICA patent bilaterally. Basilar widely patent. Posterior circulation normal without stenosis or large vessel occlusion. Venous sinuses: Normal venous enhancement. Anatomic variants: None Review of the MIP images confirms the above findings IMPRESSION: 1. Normal carotid and vertebral arteries in the neck 2. No intracranial large vessel occlusion. There is a small area of hypoperfusion in the left parietal lobe consistent with subacute infarct. This area shows hypodensity on CT. Electronically Signed   By:  Franchot Gallo M.D.   On: 07/19/2020 20:58   CT Angio Chest PE W and/or Wo Contrast  Result Date: 07/17/2020 CLINICAL DATA:  Dyspnea, left chest pain, productive cough EXAM: CT ANGIOGRAPHY CHEST WITH CONTRAST TECHNIQUE: Multidetector CT imaging of the chest was performed using the standard protocol during bolus administration of intravenous contrast. Multiplanar CT image reconstructions and MIPs were obtained to evaluate the vascular anatomy. CONTRAST:  49mL OMNIPAQUE IOHEXOL 350 MG/ML SOLN COMPARISON:  None FINDINGS: Cardiovascular: There is adequate opacification of the pulmonary arterial tree. There is no intraluminal filling defect identified to suggest acute pulmonary embolism. Central pulmonary arteries are of normal caliber. No significant coronary artery calcification. Global cardiac size is within normal limits. Mild left ventricular dilation, however, is noted. No pericardial effusion. The thoracic aorta is unremarkable. Mediastinum/Nodes: No enlarged mediastinal, hilar, or axillary lymph nodes. Thyroid gland, trachea, and esophagus demonstrate no significant findings. Lungs/Pleura: Small right pleural effusion is present with minimal associated right basilar atelectasis. The lungs are otherwise clear. No pneumothorax. No central obstructing lesion. Upper Abdomen: No acute abnormality. Musculoskeletal: Osseous structures are age-appropriate. Gynecomastia noted. Review of the MIP images  confirms the above findings. IMPRESSION: No pulmonary embolism. Mild left ventricular dilation. This could be better assessed with echocardiography. Small right pleural effusion. Electronically Signed   By: Helyn Numbers MD   On: 07/17/2020 01:55   MR Brain Wo Contrast (neuro protocol)  Result Date: 07/20/2020 CLINICAL DATA:  45 year old male with stroke-like symptoms for 4 days. 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 COVID-19. EXAM: MRI HEAD WITHOUT CONTRAST  TECHNIQUE: Multiplanar, multiecho pulse sequences of the brain and surrounding structures were obtained without intravenous contrast. COMPARISON:  CT head, CTA head and neck yesterday. FINDINGS: Brain: Confluent restricted diffusion from the posterior temporal lobe through the lateral left occipital lobe tracking to the left occipital pole, an area of 9.5 cm (series 2, image 29) corresponding to the CT finding yesterday. Multifocal petechial hemorrhage (series 7, image 61) and confluent cytotoxic edema but no malignant hemorrhagic transformation and only mild mass effect on the left lateral ventricle without midline shift. Superimposed small cortical restricted diffusion at the right superior parietal lobe (series 2, image 44). Mild if any associated T2 and FLAIR hyperintensity. No hemorrhage. No other restricted diffusion. No midline shift, evidence of mass lesion, ventriculomegaly, extra-axial collection. Patchy and nodular superimposed bilateral cerebral white matter T2 and FLAIR hyperintensity in a nonspecific configuration. The corpus callosum and temporal lobes are spared. No chronic cortical encephalomalacia. But there do appear to be several chronic microhemorrhages in the left lentiform nuclei (series 7, images 52 and 56). No other chronic cerebral blood products. Elsewhere the deep gray nuclei, brainstem and cerebellum are within normal limits. Cervicomedullary junction and pituitary are within normal limits. Vascular: Major intracranial vascular flow voids are preserved. Skull and upper cervical spine: Negative. Sinuses/Orbits: Disconjugate gaze otherwise negative orbits. Paranasal sinuses and mastoids are stable and well pneumatized. Other: None. IMPRESSION: 1. Large acute infarct in the posterior left hemisphere with petechial hemorrhage and cytotoxic edema, but no malignant hemorrhagic transformation or significant intracranial mass effect. 2. Solitary small acute cortical infarct superimposed in the  superior right parietal lobe. 3. Chronic micro-hemorrhages in the left lentiform nuclei. Bilateral periventricular white matter signal changes which may therefore be chronic small vessel disease related. Electronically Signed   By: Odessa Fleming M.D.   On: 07/20/2020 12:00   DG Chest Portable 1 View  Result Date: 07/19/2020 CLINICAL DATA:  Fever, altered level of consciousness EXAM: PORTABLE CHEST 1 VIEW COMPARISON:  07/17/2020 FINDINGS: Single frontal view of the chest demonstrates an enlarged cardiac silhouette. Increased central vascular congestion, with interval development of bilateral interstitial prominence. No large effusion or pneumothorax. No acute bony abnormalities. IMPRESSION: 1. Worsening volume status, with mild interstitial edema on today's exam. Electronically Signed   By: Sharlet Salina M.D.   On: 07/19/2020 18:49   DG Chest Port 1 View  Result Date: 07/16/2020 CLINICAL DATA:  Intermittent sharp left-sided chest pain, short of breath, productive cough EXAM: PORTABLE CHEST 1 VIEW COMPARISON:  05/29/2018 FINDINGS: The heart size and mediastinal contours are within normal limits. Both lungs are clear. The visualized skeletal structures are unremarkable. IMPRESSION: No active disease. Electronically Signed   By: Sharlet Salina M.D.   On: 07/16/2020 17:21   ECHOCARDIOGRAM COMPLETE  Result Date: 07/20/2020    ECHOCARDIOGRAM REPORT   Patient Name:   COREN SAGAN Date of Exam: 07/20/2020 Medical Rec #:  [id number]     Height:       73.0 in Accession #:    [id number]  Weight:       262.3 lb Date of Birth:  01-17-1976     BSA:          2.415 m Patient Age:    44 years      BP:           153/106 mmHg Patient Gender: M             HR:           108 bpm. Exam Location:  Inpatient Procedure: 2D Echo, Color Doppler, Cardiac Doppler and Intracardiac            Opacification Agent Indications:    CHF-Acute Systolic I50.21  History:        Patient has prior history of Echocardiogram examinations, most                  recent 01/04/2017. Risk Factors:Hypertension, Dyslipidemia and                 Diabetes.  Sonographer:    Eulah Pont RDCS Referring Phys: [id number] VASUNDHRA RATHORE IMPRESSIONS  1. A small (8 mm diameter), almost spherical, slightly mobile ventricular apical thrombus is seen. Left ventricular ejection fraction, by estimation, is 20 to 25%. The left ventricle has severely decreased function. The left ventricle demonstrates global hypokinesis. The left ventricular internal cavity size was moderately dilated. Indeterminate diastolic filling due to E-A fusion.  2. Right ventricular systolic function is mildly reduced. The right ventricular size is normal. Tricuspid regurgitation signal is inadequate for assessing PA pressure.  3. Left atrial size was moderately dilated.  4. A small pericardial effusion is present. The pericardial effusion is localized near the right atrium.  5. The mitral valve is normal in structure. No evidence of mitral valve regurgitation. No evidence of mitral stenosis.  6. The aortic valve is tricuspid. Aortic valve regurgitation is not visualized. No aortic stenosis is present.  7. The inferior vena cava is normal in size with greater than 50% respiratory variability, suggesting right atrial pressure of 3 mmHg. Comparison(s): Prior images unable to be directly viewed, comparison made by report only. The left ventricular function is worsened. A new apical LV thrombus is seen. FINDINGS  Left Ventricle: A small (8 mm diameter), almost spherical, slightly mobile ventricular apical thrombus is seen. Left ventricular ejection fraction, by estimation, is 20 to 25%. The left ventricle has severely decreased function. The left ventricle demonstrates global hypokinesis. Definity contrast agent was given IV to delineate the left ventricular endocardial borders. The left ventricular internal cavity size was moderately dilated. There is no left ventricular hypertrophy. Indeterminate diastolic  filling due to E-A fusion. Right Ventricle: The right ventricular size is normal. No increase in right ventricular wall thickness. Right ventricular systolic function is mildly reduced. Tricuspid regurgitation signal is inadequate for assessing PA pressure. Left Atrium: Left atrial size was moderately dilated. Right Atrium: Right atrial size was normal in size. Pericardium: A small pericardial effusion is present. The pericardial effusion is localized near the right atrium. Mitral Valve: The mitral valve is normal in structure. No evidence of mitral valve regurgitation. No evidence of mitral valve stenosis. Tricuspid Valve: The tricuspid valve is normal in structure. Tricuspid valve regurgitation is not demonstrated. Aortic Valve: The aortic valve is tricuspid. Aortic valve regurgitation is not visualized. No aortic stenosis is present. Pulmonic Valve: The pulmonic valve was grossly normal. Pulmonic valve regurgitation is not visualized. Aorta: The aortic root and ascending aorta are structurally normal, with no evidence  of dilitation. Venous: The inferior vena cava is normal in size with greater than 50% respiratory variability, suggesting right atrial pressure of 3 mmHg. IAS/Shunts: No atrial level shunt detected by color flow Doppler.  LEFT VENTRICLE PLAX 2D LVIDd:         6.60 cm LVIDs:         5.40 cm LV PW:         1.10 cm LV IVS:        0.90 cm LVOT diam:     2.10 cm LV SV:         37 LV SV Index:   16 LVOT Area:     3.46 cm  RIGHT VENTRICLE RV S prime:     7.89 cm/s TAPSE (M-mode): 2.0 cm LEFT ATRIUM              Index       RIGHT ATRIUM           Index LA diam:        4.20 cm  1.74 cm/m  RA Area:     17.50 cm LA Vol (A2C):   98.6 ml  40.83 ml/m RA Volume:   46.80 ml  19.38 ml/m LA Vol (A4C):   101.0 ml 41.83 ml/m LA Biplane Vol: 103.0 ml 42.66 ml/m  AORTIC VALVE LVOT Vmax:   76.50 cm/s LVOT Vmean:  54.750 cm/s LVOT VTI:    0.108 m  AORTA Ao Root diam: 3.60 cm Ao Asc diam:  3.10 cm  SHUNTS Systemic  VTI:  0.11 m Systemic Diam: 2.10 cm Rachelle Hora Croitoru MD Electronically signed by Thurmon Fair MD Signature Date/Time: 07/20/2020/1:15:21 PM    Final

## 2020-07-24 NOTE — Progress Notes (Signed)
  Speech Language Pathology Treatment: Cognitive-Linquistic  Patient Details Name: Rodney Kane MRN: [id number] DOB: 1976-06-26 Today's Date: 07/24/2020 Time: 1520-1600 SLP Time Calculation (min) (ACUTE ONLY): 40 min  Assessment / Plan / Recommendation Clinical Impression  Pt was seen at bedside for skilled ST intervention targeting goals for improved receptive and expressive language. Pt was awake and alert, and was cooperative with unfamiliar therapist.   RECEPTIVE LANGUAGE: Responses to simple yes/no questions were <50% accurate. PT was able to follow 1-step verbal commands with 70% accuracy. Right/Left discrimination was difficult, likely due to those directions being more complex.  Pt was able to identify body parts without difficulty.   EXPRESSIVE LANGUAGE: Pt struggled with automatic sequences, and was noted to be perseverative. Mod cues were helpful. Pt was inconsistent with ability to repeat single words, with 50% accuracy. Responsive naming tasks were 20% accurate. Pt was able to name objects around his room with 60% accuracy. Dysfluency was noted only once (le-le-lettuce).   Continued skilled ST intervention is recommended after DC from acute care, to maximize communicative effectiveness and efficiency. ST will continue to follow pt acutely.   HPI HPI: 45 y.o. 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 CT head showing acute/subacute infarct in the left posterior parietal lobe. Pt with persistent tachycardia, Admitted for treatment of severe sepsis secondary to COVID, Acute on chronic combined systolic and diastolic CHF/nonischemic cardiomyopathy and acute/subacute L MCA CVA      SLP Plan  Continue with  current plan of care       Recommendations  Continued ST intervention at next level of care.                Follow up Recommendations: Outpatient SLP SLP Visit Diagnosis: Aphasia (R47.01) Plan: Continue with current plan of care       GO              Khing Belcher B. Murvin Natal, [facility], CCC-SLP Speech Language Pathologist Office: [phone] Pager: [phone]  Leigh Aurora 07/24/2020, 4:14 PM

## 2020-07-24 NOTE — Progress Notes (Signed)
ANTICOAGULATION CONSULT NOTE  Pharmacy Consult for Heparin and Warfarin Indication: LV thrombus with recent CVA  No Known Allergies  Patient Measurements: Height: 6\' 1"  (185.4 cm) Weight: 122.9 kg (270 lb 15.1 oz) IBW/kg (Calculated) : 79.9 Heparin Dosing Weight: 105.6 kg  Vital Signs: Temp: 97.8 F (36.6 C) (01/03 2107) Temp Source: Axillary (01/03 2107) BP: 148/104 (01/03 2107) Pulse Rate: 85 (01/03 2107)  Labs: Recent Labs    07/22/20 0208 07/22/20 1421 07/23/20 0349 07/23/20 0913 07/23/20 1800 07/24/20 0422  HGB 13.5  --  13.0  --   --  13.3  HCT 40.5  --  40.3  --   --  41.1  PLT 300  --  302  --   --  301  LABPROT  --  15.1 14.7  --   --  15.7*  INR  --  1.2 1.2  --   --  1.3*  HEPARINUNFRC 0.68 0.65 0.30 0.22* 0.26* 0.28*  CREATININE 1.11  --  1.06  --   --  0.97    Estimated Creatinine Clearance: 132.1 mL/min (by C-G formula based on SCr of 0.97 mg/dL).   Assessment: Patient is a 63 yom that is admitted for a CVA. Patient was also found to have a LV thrombus on evaluation and is on no prior anticoagulation. Pharmacy has been asked to bridge heparin at this time for LV thrombus. Heparin levels have been subtherapeutic, but trending up.     Patient has been started on warfarin and requires education prior to discharge. Patient is currently on doxycycline which can increase the effects of warfarin. Neuro has agreed with an INR goal of 2-2.5. INR today is 1.3. No signs of bleeding per nurse.   Goal of Therapy:  Heparin level 0.3-0.5 units/ml Monitor platelets by anticoagulation protocol: Yes  INR Goal 2-2.5 per neuro   Plan:  - Increase heparin to 1400 units/hour to target lower end of goal  - Warfarin 10 mg x1 today  - Monitor for s/sx of bleed  - HL and CBC daily  - Daily INR, continue heparin bridge until INR therapeutic   Ireoluwa Grant A. 53, PharmD, BCPS, FNKF Clinical Pharmacist [place] Please utilize Amion for appropriate phone number to reach  the unit pharmacist [facility])       07/24/2020 7:21 AM

## 2020-07-24 NOTE — Progress Notes (Signed)
ANTICOAGULATION CONSULT NOTE  Pharmacy Consult for Heparin and Warfarin Indication: LV thrombus with recent CVA  No Known Allergies  Patient Measurements: Height: 6\' 1"  (185.4 cm) Weight: 122.9 kg (270 lb 15.1 oz) IBW/kg (Calculated) : 79.9 Heparin Dosing Weight: 105.6 kg  Vital Signs: Temp: 99 F (37.2 C) (01/04 1320) Temp Source: Oral (01/04 1320) BP: 138/90 (01/04 1320) Pulse Rate: 93 (01/04 1320)  Labs: Recent Labs    07/22/20 0208 07/22/20 1421 07/23/20 0349 07/23/20 0913 07/23/20 1800 07/24/20 0422 07/24/20 1414  HGB 13.5  --  13.0  --   --  13.3  --   HCT 40.5  --  40.3  --   --  41.1  --   PLT 300  --  302  --   --  301  --   LABPROT  --  15.1 14.7  --   --  15.7*  --   INR  --  1.2 1.2  --   --  1.3*  --   HEPARINUNFRC 0.68 0.65 0.30   < > 0.26* 0.28* 0.29*  CREATININE 1.11  --  1.06  --   --  0.97  --    < > = values in this interval not displayed.    Estimated Creatinine Clearance: 132.1 mL/min (by C-G formula based on SCr of 0.97 mg/dL).   Assessment: Patient is a 67 yom that is admitted for a CVA. Patient was also found to have a LV thrombus on evaluation and is on no prior anticoagulation. Pharmacy has been asked to bridge heparin at this time for LV thrombus. Heparin levels have been subtherapeutic, but trending up.    Warfarin load continues. Heparin level just below goal at 0.29.   Goal of Therapy:  Heparin level 0.3-0.5 units/ml Monitor platelets by anticoagulation protocol: Yes  INR Goal 2-2.5 per neuro   Plan:  -Increase heparin to 1500 units/h -Daily heparin level and CBC  53, PharmD, BCPS, [facility] Clinical Pharmacist [phone] Please check AMION for all [facility] Pharmacy numbers 07/24/2020

## 2020-07-25 DIAGNOSIS — I513 Intracardiac thrombosis, not elsewhere classified: Secondary | ICD-10-CM

## 2020-07-25 DIAGNOSIS — E139 Other specified diabetes mellitus without complications: Secondary | ICD-10-CM

## 2020-07-25 DIAGNOSIS — I5041 Acute combined systolic (congestive) and diastolic (congestive) heart failure: Secondary | ICD-10-CM | POA: Diagnosis not present

## 2020-07-25 DIAGNOSIS — U071 COVID-19: Secondary | ICD-10-CM | POA: Diagnosis not present

## 2020-07-25 DIAGNOSIS — R9431 Abnormal electrocardiogram [ECG] [EKG]: Secondary | ICD-10-CM

## 2020-07-25 LAB — CBC WITH DIFFERENTIAL/PLATELET
Abs Immature Granulocytes: 0.06 10*3/uL (ref 0.00–0.07)
Basophils Absolute: 0 10*3/uL (ref 0.0–0.1)
Basophils Relative: 0 %
Eosinophils Absolute: 0 10*3/uL (ref 0.0–0.5)
Eosinophils Relative: 0 %
HCT: 42.2 % (ref 39.0–52.0)
Hemoglobin: 14.2 g/dL (ref 13.0–17.0)
Immature Granulocytes: 1 %
Lymphocytes Relative: 13 %
Lymphs Abs: 1.5 10*3/uL (ref 0.7–4.0)
MCH: 25.9 pg — ABNORMAL LOW (ref 26.0–34.0)
MCHC: 33.6 g/dL (ref 30.0–36.0)
MCV: 76.9 fL — ABNORMAL LOW (ref 80.0–100.0)
Monocytes Absolute: 0.7 10*3/uL (ref 0.1–1.0)
Monocytes Relative: 6 %
Neutro Abs: 9.6 10*3/uL — ABNORMAL HIGH (ref 1.7–7.7)
Neutrophils Relative %: 80 %
Platelets: 325 10*3/uL (ref 150–400)
RBC: 5.49 MIL/uL (ref 4.22–5.81)
RDW: 13.2 % (ref 11.5–15.5)
WBC: 11.8 10*3/uL — ABNORMAL HIGH (ref 4.0–10.5)
nRBC: 0 % (ref 0.0–0.2)

## 2020-07-25 LAB — COMPREHENSIVE METABOLIC PANEL
ALT: 25 U/L (ref 0–44)
AST: 18 U/L (ref 15–41)
Albumin: 3 g/dL — ABNORMAL LOW (ref 3.5–5.0)
Alkaline Phosphatase: 52 U/L (ref 38–126)
Anion gap: 10 (ref 5–15)
BUN: 21 mg/dL — ABNORMAL HIGH (ref 6–20)
CO2: 25 mmol/L (ref 22–32)
Calcium: 9 mg/dL (ref 8.9–10.3)
Chloride: 100 mmol/L (ref 98–111)
Creatinine, Ser: 0.99 mg/dL (ref 0.61–1.24)
GFR, Estimated: >60 mL/min (ref >60)
Glucose, Bld: 264 mg/dL — ABNORMAL HIGH (ref 70–99)
Potassium: 3.9 mmol/L (ref 3.5–5.1)
Sodium: 135 mmol/L (ref 135–145)
Total Bilirubin: 0.7 mg/dL (ref 0.3–1.2)
Total Protein: 6.5 g/dL (ref 6.5–8.1)

## 2020-07-25 LAB — PROTIME-INR
INR: 1.7 — ABNORMAL HIGH (ref 0.8–1.2)
Prothrombin Time: 19.7 seconds — ABNORMAL HIGH (ref 11.4–15.2)

## 2020-07-25 LAB — GLUCOSE, CAPILLARY
Glucose-Capillary: 198 mg/dL — ABNORMAL HIGH (ref 70–99)
Glucose-Capillary: 245 mg/dL — ABNORMAL HIGH (ref 70–99)
Glucose-Capillary: 255 mg/dL — ABNORMAL HIGH (ref 70–99)
Glucose-Capillary: 284 mg/dL — ABNORMAL HIGH (ref 70–99)

## 2020-07-25 LAB — HEPARIN LEVEL (UNFRACTIONATED)
Heparin Unfractionated: 0.95 IU/mL — ABNORMAL HIGH (ref 0.30–0.70)
Heparin Unfractionated: 1.03 IU/mL — ABNORMAL HIGH (ref 0.30–0.70)
Heparin Unfractionated: 0.53 IU/mL (ref 0.30–0.70)

## 2020-07-25 LAB — D-DIMER, QUANTITATIVE: D-Dimer, Quant: 0.44 ug/mL-FEU (ref 0.00–0.50)

## 2020-07-25 LAB — C-REACTIVE PROTEIN: CRP: 1.9 mg/dL — ABNORMAL HIGH (ref <1.0)

## 2020-07-25 MED ORDER — INSULIN ASPART 100 UNIT/ML ~~LOC~~ SOLN
3.0000 [IU] | Freq: Three times a day (TID) | SUBCUTANEOUS | Status: DC
  Administered 2020-07-26 – 2020-07-28 (×7): 3 [IU] via SUBCUTANEOUS

## 2020-07-25 MED ORDER — LOSARTAN POTASSIUM 50 MG PO TABS
25.0000 mg | ORAL_TABLET | Freq: Two times a day (BID) | ORAL | Status: DC
  Administered 2020-07-25 – 2020-07-28 (×6): 25 mg via ORAL
  Filled 2020-07-25 (×6): qty 1

## 2020-07-25 MED ORDER — PREDNISONE 20 MG PO TABS
40.0000 mg | ORAL_TABLET | Freq: Every day | ORAL | Status: DC
  Administered 2020-07-26 – 2020-07-28 (×3): 40 mg via ORAL
  Filled 2020-07-25 (×3): qty 2

## 2020-07-25 MED ORDER — WARFARIN SODIUM 7.5 MG PO TABS
7.5000 mg | ORAL_TABLET | Freq: Once | ORAL | Status: AC
  Administered 2020-07-25: 7.5 mg via ORAL
  Filled 2020-07-25: qty 1

## 2020-07-25 NOTE — Progress Notes (Signed)
Inpatient Diabetes Program Recommendations  AACE/ADA: New Consensus Statement on Inpatient Glycemic Control   Target Ranges:  Prepandial:   less than 140 mg/dL      Peak postprandial:   less than 180 mg/dL (1-2 hours)      Critically ill patients:  140 - 180 mg/dL   Results for Rodney Kane, Rodney Kane (MRN [id number]) as of 07/25/2020 10:33  Ref. Range 07/24/2020 07:48 07/24/2020 12:07 07/24/2020 17:36 07/24/2020 21:23 07/25/2020 07:24  Glucose-Capillary Latest Ref Range: 70 - 99 mg/dL 213 (H) 086 (H) 578 (H) 310 (H) 198 (H)   Review of Glycemic Control  Diabetes history: DM2 Outpatient Diabetes medications: Glipizide 10 mg BID, Lantus 50 units QHS, NOvolog 15 units TID with meals, Metformin 1000 mg BID Current orders for Inpatient glycemic control: Levemir 5 units daily, Tradjenta 5 mg daily, Novolog 0-9 units TID with meals, Novolog 0-5 units QHS, Glipizide 10 mg BID; Decadron 6 mg daily  Inpatient Diabetes Program Recommendations:    Insulin:  If steroids are continued, please consider ordering Novolog 3 units TID with meals for meal coverage if patient eats at least 50% of meals.  Thanks, Orlando Penner, RN, MSN, CDE Diabetes Coordinator Inpatient Diabetes Program [phone] (Team Pager from 8am to 5pm)

## 2020-07-25 NOTE — Progress Notes (Signed)
Received call from lab that heparin level tube that was collect had insufficient blood and needed to be redrawn. Order for heparin level placed.

## 2020-07-25 NOTE — Hospital Course (Addendum)
45 year old man PMH chronic combined systolic and diastolic CHF, nonischemic cardiomyopathy, diabetes, noncompliance presented with expressive aphasia and staggering gait.  Admitted for acute CVA, sepsis secondary to COVID-19, acute on chronic CHF, acute stroke, seen by neurology and cardiology.  Responded well to treatment for Covid, which has resolved at this point.  Followed by neurology with recommendation for warfarin.  Found to have left ventricular thrombus, hospitalization prolonged by need to bridge with heparin to therapeutic warfarin. Seen by therapy w/ recommendations for PT,OT,SLP after discharge.  Stable for discharge, only barrier is finding HH that can draw INR. Cannot go to Coumadin clinic until 1/20.  Acute CVA with expressive aphasia and staggering gait on admission. --Seen by neurology --recommendation was warfarin and outpatient follow-up.  Continue statin.   --PT, OT, SLP to continue after discharge  Left ventricular thrombus --treated w/ heparin bridge to therapeutic warfarin. --now therapeutic 2 days (goal INR 2-2.5) --INR per [facility] until able to go to warfarin clinic 08/09/20  COVID-19, sepsis considered on admission, ruled out. --resolved, no hypoxia. Treated with remdesivir and steroids. --Treated with empiric antibiotics based on procalcitonin, no apparent bacterial infection noted.  Acute on chronic combined systolic, diastolic CHF, nonischemic cardiomyopathy --appears euvolemic, will continue losartan per cardiology, continue carvedilol  Essential hypertension --stable  Diabetes mellitus type 2 hemoglobin A1c 7.7 --CBG remains stable --continue Lantus, meal coverage, linagliptin, glipizide as outpatient --stop metformin  Prolonged QT

## 2020-07-25 NOTE — Progress Notes (Signed)
ANTICOAGULATION CONSULT NOTE  Pharmacy Consult for Heparin and Warfarin Indication: LV thrombus with recent CVA  No Known Allergies  Patient Measurements: Height: 6\' 1"  (185.4 cm) Weight: 123.1 kg (271 lb 6.2 oz) IBW/kg (Calculated) : 79.9 Heparin Dosing Weight: 105.6 kg  Vital Signs: Temp: 98.1 F (36.7 C) (01/05 0539) Temp Source: Axillary (01/05 0539) BP: 163/115 (01/05 0539) Pulse Rate: 89 (01/05 0539)  Labs: Recent Labs    07/23/20 0349 07/23/20 0913 07/24/20 0422 07/24/20 1414 07/25/20 0207 07/25/20 0623  HGB 13.0  --  13.3  --  14.2  --   HCT 40.3  --  41.1  --  42.2  --   PLT 302  --  301  --  325  --   LABPROT 14.7  --  15.7*  --  19.7*  --   INR 1.2  --  1.3*  --  1.7*  --   HEPARINUNFRC 0.30   < > 0.28* 0.29* 0.95* 1.03*  CREATININE 1.06  --  0.97  --  0.99  --    < > = values in this interval not displayed.    Estimated Creatinine Clearance: 129.5 mL/min (by C-G formula based on SCr of 0.99 mg/dL).   Assessment: Patient is a 11 yom that is admitted for a CVA. Patient was also found to have a LV thrombus on evaluation and is on no prior anticoagulation. Pharmacy has been asked to bridge heparin at this time for LV thrombus. Heparin levels now supratherapetuic suddenly at 0.95 and confirmed at 1.03. Will hold drip for 1 hour and decrease rate.    Patient has been started on warfarin and requires education prior to discharge. Patient previously on doxycycline which can increase the effects of warfarin. Neuro has agreed with an INR goal of 2-2.5. INR 1.3>>1.7. No signs of bleeding per nurse.   Goal of Therapy:  Heparin level 0.3-0.5 units/ml Monitor platelets by anticoagulation protocol: Yes  INR Goal 2-2.5 per neuro   Plan:  - Hold heparin for 1 hour and then decrease rate to 1300 units/hour to target lower end of goal  - Warfarin 7.5 mg x1 today  - Monitor for s/sx of bleed  - HL and CBC daily  - Daily INR, continue heparin bridge until INR  therapeutic   Dane Bloch A. 53, PharmD, BCPS, FNKF Clinical Pharmacist [place] Please utilize Amion for appropriate phone number to reach the unit pharmacist [facility])       07/25/2020 7:27 AM

## 2020-07-25 NOTE — Plan of Care (Signed)
  Problem: Education: Goal: Knowledge of General Education information will improve Description: Including pain rating scale, medication(s)/side effects and non-pharmacologic comfort measures Outcome: Progressing   Problem: Health Behavior/Discharge Planning: Goal: Ability to manage health-related needs will improve Outcome: Progressing   Problem: Clinical Measurements: Goal: Ability to maintain clinical measurements within normal limits will improve Outcome: Progressing Goal: Will remain free from infection Outcome: Progressing Goal: Diagnostic test results will improve Outcome: Progressing Goal: Respiratory complications will improve Outcome: Progressing Goal: Cardiovascular complication will be avoided Outcome: Progressing   Problem: Activity: Goal: Risk for activity intolerance will decrease Outcome: Progressing   Problem: Nutrition: Goal: Adequate nutrition will be maintained Outcome: Progressing   Problem: Coping: Goal: Level of anxiety will decrease Outcome: Progressing   Problem: Elimination: Goal: Will not experience complications related to bowel motility Outcome: Progressing Goal: Will not experience complications related to urinary retention Outcome: Progressing   Problem: Pain Managment: Goal: General experience of comfort will improve Outcome: Progressing   Problem: Safety: Goal: Ability to remain free from injury will improve Outcome: Progressing   Problem: Skin Integrity: Goal: Risk for impaired skin integrity will decrease Outcome: Progressing   Problem: Education: Goal: Knowledge of risk factors and measures for prevention of condition will improve Outcome: Progressing   Problem: Coping: Goal: Psychosocial and spiritual needs will be supported Outcome: Progressing   Problem: Respiratory: Goal: Will maintain a patent airway Outcome: Progressing Goal: Complications related to the disease process, condition or treatment will be avoided or  minimized Outcome: Progressing   Problem: Education: Goal: Ability to describe self-care measures that may prevent or decrease complications (Diabetes Survival Skills Education) will improve Outcome: Progressing Goal: Individualized Educational Video(s) Outcome: Progressing   Problem: Coping: Goal: Ability to adjust to condition or change in health will improve Outcome: Progressing   Problem: Fluid Volume: Goal: Ability to maintain a balanced intake and output will improve Outcome: Progressing   Problem: Health Behavior/Discharge Planning: Goal: Ability to identify and utilize available resources and services will improve Outcome: Progressing Goal: Ability to manage health-related needs will improve Outcome: Progressing   Problem: Metabolic: Goal: Ability to maintain appropriate glucose levels will improve Outcome: Progressing   Problem: Nutritional: Goal: Maintenance of adequate nutrition will improve Outcome: Progressing Goal: Progress toward achieving an optimal weight will improve Outcome: Progressing   Problem: Skin Integrity: Goal: Risk for impaired skin integrity will decrease Outcome: Progressing   Problem: Tissue Perfusion: Goal: Adequacy of tissue perfusion will improve Outcome: Progressing

## 2020-07-25 NOTE — Progress Notes (Signed)
Progress Note  Patient Name: Rodney Kane Date of Encounter: 07/25/2020  [facility] Cardiologist:  Last seen by D. Herbie [place] 2018     Subjective   Pt reports that his breathing is good   He deniees CP    Inpatient Medications    Scheduled Meds: . vitamin C  500 mg Oral Daily  . atorvastatin  80 mg Oral Daily  . carvedilol  12.5 mg Oral BID WC  . cholecalciferol  1,000 Units Oral Daily  . dexamethasone (DECADRON) injection  6 mg Intravenous Daily  . glipiZIDE  10 mg Oral BID AC  . insulin aspart  0-5 Units Subcutaneous QHS  . insulin aspart  0-9 Units Subcutaneous TID WC  . insulin glargine  5 Units Subcutaneous Daily  . linagliptin  5 mg Oral Daily  . losartan  25 mg Oral Daily  . pantoprazole  40 mg Oral Daily  . warfarin  7.5 mg Oral ONCE-1600  . Warfarin - Pharmacist Dosing Inpatient   Does not apply q1600  . zinc sulfate  220 mg Oral Daily   Continuous Infusions: . heparin 1,300 Units/hr (07/25/20 0829)   PRN Meds: acetaminophen   Vital Signs    Vitals:   07/24/20 2125 07/25/20 0529 07/25/20 0539 07/25/20 0800  BP: (!) 151/109  (!) 163/115 (!) 160/116  Pulse: 84  89 92  Resp: (!) 22  13 (!) 21  Temp: 98.2 F (36.8 C)  98.1 F (36.7 C)   TempSrc: Oral  Axillary   SpO2: 98% 97% 96% 96%  Weight:   123.1 kg   Height:        Intake/Output Summary (Last 24 hours) at 07/25/2020 1332 Last data filed at 07/25/2020 1000 Gross per 24 hour  Intake 1433.5 ml  Output --  Net 1433.5 ml   Last 3 Weights 07/25/2020 07/24/2020 07/23/2020  Weight (lbs) 271 lb 6.2 oz 270 lb 15.1 oz 271 lb 2.7 oz  Weight (kg) 123.1 kg 122.9 kg 123 kg       ECG    No new - Personally Reviewed  Physical Exam  Exam deferred with Hx of COVID to minimize exposure   Reviewed from Hospitalist   Labs    High Sensitivity Troponin:   Recent Labs  Lab 07/16/20 1634 07/16/20 2106 07/19/20 1907 07/19/20 2107  TROPONINIHS 13 12 45* 49*      Chemistry Recent Labs  Lab 07/23/20 0349  07/24/20 0422 07/25/20 0207  NA 134* 135 135  K 3.7 3.6 3.9  CL 102 103 100  CO2 22 24 25   GLUCOSE 205* 145* 264*  BUN 22* 21* 21*  CREATININE 1.06 0.97 0.99  CALCIUM 8.7* 8.8* 9.0  PROT 6.2* 6.4* 6.5  ALBUMIN 3.0* 3.0* 3.0*  AST 25 19 18   ALT 24 21 25   ALKPHOS 53 49 52  BILITOT 0.7 0.6 0.7  GFRNONAA >60 >60 >60  ANIONGAP 10 8 10      Hematology Recent Labs  Lab 07/23/20 0349 07/24/20 0422 07/25/20 0207  WBC 9.1 10.6* 11.8*  RBC 5.17 5.25 5.49  HGB 13.0 13.3 14.2  HCT 40.3 41.1 42.2  MCV 77.9* 78.3* 76.9*  MCH 25.1* 25.3* 25.9*  MCHC 32.3 32.4 33.6  RDW 13.1 13.1 13.2  PLT 302 301 325    BNP Recent Labs  Lab 07/19/20 1907  BNP 1,619.5*     DDimer  Recent Labs  Lab 07/23/20 0349 07/24/20 0422 07/25/20 0207  DDIMER 0.48 0.43 0.44  Radiology    No results found.  Cardiac Studies    Echo:   07/20/20  1. A small (8 mm diameter), almost spherical, slightly mobile ventricular apical thrombus is seen. Left ventricular ejection fraction, by estimation, is 20 to 25%. The left ventricle has severely decreased function. The left ventricle demonstrates global hypokinesis. The left ventricular internal cavity size was moderately dilated. Indeterminate diastolic filling due to E-A fusion. 2. Right ventricular systolic function is mildly reduced. The right ventricular size is normal. Tricuspid regurgitation signal is inadequate for assessing PA pressure. 3. Left atrial size was moderately dilated. 4. A small pericardial effusion is present. The pericardial effusion is localized near the right atrium. 5. The mitral valve is normal in structure. No evidence of mitral valve regurgitation. No evidence of mitral stenosis. 6. The aortic valve is tricuspid. Aortic valve regurgitation is not visualized. No aortic stenosis is present. 7. The inferior vena cava is normal in size with greater than 50% respiratory variability, suggesting right atrial pressure of 3  mmHg. Comparison(s): Prior images unable to be directly viewed, comparison made by report only. The left ventricular function is worsened. A new apical LV thrombus is seen.  Patient Profile      Rodney Kane is a 45 y.o. male with a hx of uncontrolled hypertension, DM 2, nonischemic cardiomyopathy diagnosed in June 2018 and history of medication noncompliance who had cardiology consulted for the evaluation of LV thrombus and drop in EF at the request of Dr. Randol Kern.    Assessment & Plan    1  LV thrombus  Being anticoagulated.  INR 1.7 today  Continues load.    2  Systollic CHF Known CHF   Pt noncomplient with meds     LVEF 20 to 25%  Down from previous 30 to 35%  RVEF mildly reduced     Pt comfortable per report  On carvedilol and losartan   I will increase losartan to 25 bid    Follow BP   3  CVA  Large L MCA and punctate R frontparietal infarcts     Some phemorrhagic transformation.   Felt to be embolic with mural thrombus present   4  COVID  Pt unvaccinated      For questions or updates, please contact [facility] Please consult [email or web address] for contact info under        Signed, Dietrich Pates, MD  07/25/2020, 1:32 PM

## 2020-07-25 NOTE — Progress Notes (Signed)
ANTICOAGULATION CONSULT NOTE  Pharmacy Consult for Heparin and Warfarin Indication: LV thrombus with recent CVA  No Known Allergies  Patient Measurements: Height: 6\' 1"  (185.4 cm) Weight: 123.1 kg (271 lb 6.2 oz) IBW/kg (Calculated) : 79.9 Heparin Dosing Weight: 105.6 kg  Vital Signs: Temp: 98.3 F (36.8 C) (01/05 2053) Temp Source: Axillary (01/05 2053) BP: 137/101 (01/05 2053) Pulse Rate: 88 (01/05 2053)  Labs: Recent Labs    07/23/20 0349 07/23/20 0913 07/24/20 0422 07/24/20 1414 07/25/20 0207 07/25/20 0623 07/25/20 1859  HGB 13.0  --  13.3  --  14.2  --   --   HCT 40.3  --  41.1  --  42.2  --   --   PLT 302  --  301  --  325  --   --   LABPROT 14.7  --  15.7*  --  19.7*  --   --   INR 1.2  --  1.3*  --  1.7*  --   --   HEPARINUNFRC 0.30   < > 0.28*   < > 0.95* 1.03* 0.53  CREATININE 1.06  --  0.97  --  0.99  --   --    < > = values in this interval not displayed.    Estimated Creatinine Clearance: 129.5 mL/min (by C-G formula based on SCr of 0.99 mg/dL).   Assessment: Patient is a 62 yom that is admitted for a CVA. Patient was also found to have a LV thrombus on evaluation and is on no prior anticoagulation. Pharmacy has been asked to bridge heparin at this time for LV thrombus.   Heparin now 0.53 on reduced rate, just slightly above goal.  Goal of Therapy:  Heparin level 0.3-0.5 units/ml Monitor platelets by anticoagulation protocol: Yes  INR Goal 2-2.5 per neuro   Plan:  -Reduce heparin to 1250 units/h -Repeat heparin level with am labs   54, PharmD, BCPS, [facility] Clinical Pharmacist [phone] Please check AMION for all [facility] Pharmacy numbers 07/25/2020

## 2020-07-25 NOTE — Progress Notes (Signed)
Wrong patient

## 2020-07-25 NOTE — Progress Notes (Signed)
PROGRESS NOTE  Rodney Kane SWN:[id number] DOB: 1975-12-26 DOA: 07/19/2020 PCP: Kallie Locks, FNP  Brief History   45 year old man PMH chronic combined systolic and diastolic CHF, nonischemic cardiomyopathy, diabetes, noncompliance presented with expressive aphasia and staggering gait.  Admitted for acute CVA, sepsis secondary to COVID-19, acute on chronic CHF, acute stroke, seen by neurology and cardiology.  Acute CVA with expressive aphasia and staggering gait on admission. --Seen by neurology with recommendation for warfarin, outpatient follow-up.  Continue statin.  Outpatient PT, OT  Left ventricular thrombus --Heparin infusion, bridged to therapeutic warfarin.Target INR 2-2.5  COVID-19, sepsis considered on admission, ruled out. --Stable, no hypoxia, treated with remdesivir and steroids. --Treated with empiric antibiotics based on procalcitonin, no apparent bacterial infection noted.  Acute on chronic combined systolic, diastolic CHF, nonischemic cardiomyopathy --Started on losartan per cardiology, continue carvedilol  Essential hypertension --Stable, cardiology adjusting medications  Diabetes mellitus type 2 hemoglobin A1c 7.7 --CBG stable, continue Lantus, Tradgenta and glipizide  Prolonged QT --check EKG in AM  Disposition Plan:  Discussion: stable for d/c when INR therapeutic  Status is: Inpatient  Remains inpatient appropriate because:IV treatments appropriate due to intensity of illness or inability to take PO   Dispo: The patient is from: Home              Anticipated d/c is to: Home              Anticipated d/c date is: 2 days              Patient currently is not medically stable to d/c.  DVT prophylaxis: Place and maintain sequential compression device Start: 07/20/20 0014   Code Status: Full Code Family Communication: none  Brendia Sacks, MD  [facility] Direct contact: see [email or web address] (further directions at bottom of note if  needed) 7PM-7AM contact night coverage as at bottom of note 07/25/2020, 7:06 PM  LOS: 6 days    Consults:  . Cardiology . neurology   Interval History/Subjective  CC: f/u stroke  Feels fine, no complaints.  Objective   Vitals:  Vitals:   07/25/20 0800 07/25/20 1710  BP: (!) 160/116 (!) 157/116  Pulse: 92 91  Resp: (!) 21 (!) 22  Temp:  98.4 F (36.9 C)  SpO2: 96% 97%    Exam:  Constitutional:   . Appears calm and comfortable ENMT:  . grossly normal hearing  Respiratory:  . CTA bilaterally, no w/r/r.  . Respiratory effort normal.  Cardiovascular:  . RRR, no m/r/g Psychiatric:  . Mental status o Mood, affect appropriate  I have personally reviewed the following:   Today's Data  . CBG stable . CMP, CBC noted . INR 1.7  Scheduled Meds: . vitamin C  500 mg Oral Daily  . atorvastatin  80 mg Oral Daily  . carvedilol  12.5 mg Oral BID WC  . cholecalciferol  1,000 Units Oral Daily  . glipiZIDE  10 mg Oral BID AC  . insulin aspart  0-5 Units Subcutaneous QHS  . insulin aspart  0-9 Units Subcutaneous TID WC  . [START ON 07/26/2020] insulin aspart  3 Units Subcutaneous TID WC  . insulin glargine  5 Units Subcutaneous Daily  . linagliptin  5 mg Oral Daily  . losartan  25 mg Oral BID  . pantoprazole  40 mg Oral Daily  . [START ON 07/26/2020] predniSONE  40 mg Oral Q breakfast  . Warfarin - Pharmacist Dosing Inpatient   Does not apply q1600  . zinc sulfate  220 mg Oral Daily   Continuous Infusions: . heparin 1,300 Units/hr (07/25/20 0829)    Principal Problem:   Acute CVA (cerebrovascular accident) (HCC) Active Problems:   Diabetes (HCC)   Acute combined systolic and diastolic heart failure (HCC)   COVID-19 virus infection   Sinus tachycardia   Left ventricular thrombosis   Prolonged QT interval   LOS: 6 days   How to contact the [facility] Attending or Consulting provider 7A - 7P or covering provider during after hours 7P -7A, for this patient?  1. Check the  care team in [facility] and look for a) attending/consulting [facility] provider listed and b) the [facility] team listed 2. Log into [email or web address] and use [place]'s universal password to access. If you do not have the password, please contact the hospital operator. 3. Locate the [facility] provider you are looking for under [facility] and page to a number that you can be directly reached. 4. If you still have difficulty reaching the provider, please page the [facility] (Director on Call) for the Hospitalists listed on amion for assistance.

## 2020-07-26 DIAGNOSIS — I513 Intracardiac thrombosis, not elsewhere classified: Secondary | ICD-10-CM | POA: Diagnosis not present

## 2020-07-26 DIAGNOSIS — R9431 Abnormal electrocardiogram [ECG] [EKG]: Secondary | ICD-10-CM

## 2020-07-26 LAB — CBC
HCT: 46 % (ref 39.0–52.0)
Hemoglobin: 14.8 g/dL (ref 13.0–17.0)
MCH: 25.2 pg — ABNORMAL LOW (ref 26.0–34.0)
MCHC: 32.2 g/dL (ref 30.0–36.0)
MCV: 78.4 fL — ABNORMAL LOW (ref 80.0–100.0)
Platelets: 361 10*3/uL (ref 150–400)
RBC: 5.87 MIL/uL — ABNORMAL HIGH (ref 4.22–5.81)
RDW: 13.3 % (ref 11.5–15.5)
WBC: 16 10*3/uL — ABNORMAL HIGH (ref 4.0–10.5)
nRBC: 0 % (ref 0.0–0.2)

## 2020-07-26 LAB — GLUCOSE, CAPILLARY
Glucose-Capillary: 225 mg/dL — ABNORMAL HIGH (ref 70–99)
Glucose-Capillary: 256 mg/dL — ABNORMAL HIGH (ref 70–99)
Glucose-Capillary: 214 mg/dL — ABNORMAL HIGH (ref 70–99)
Glucose-Capillary: 189 mg/dL — ABNORMAL HIGH (ref 70–99)

## 2020-07-26 LAB — PROTIME-INR
INR: 2.6 — ABNORMAL HIGH (ref 0.8–1.2)
Prothrombin Time: 26.6 seconds — ABNORMAL HIGH (ref 11.4–15.2)

## 2020-07-26 LAB — HEPARIN LEVEL (UNFRACTIONATED)
Heparin Unfractionated: 0.65 IU/mL (ref 0.30–0.70)
Heparin Unfractionated: 0.73 IU/mL — ABNORMAL HIGH (ref 0.30–0.70)

## 2020-07-26 NOTE — Progress Notes (Addendum)
ANTICOAGULATION CONSULT NOTE  Pharmacy Consult for Heparin and Warfarin Indication: LV thrombus with recent CVA  No Known Allergies  Patient Measurements: Height: 6\' 1"  (185.4 cm) Weight: 121.6 kg (268 lb 1.3 oz) IBW/kg (Calculated) : 79.9 Heparin Dosing Weight: 105.6 kg  Vital Signs: Temp: 97.9 F (36.6 C) (01/06 1646) Temp Source: Oral (01/06 1646) BP: 126/84 (01/06 1646) Pulse Rate: 79 (01/06 1646)  Labs: Recent Labs    07/24/20 0422 07/24/20 1414 07/25/20 0207 07/25/20 0623 07/25/20 1859 07/26/20 0155 07/26/20 1624 07/26/20 1724  HGB 13.3  --  14.2  --   --   --   --  14.8  HCT 41.1  --  42.2  --   --   --   --  46.0  PLT 301  --  325  --   --   --   --  361  LABPROT 15.7*  --  19.7*  --   --  26.6*  --   --   INR 1.3*  --  1.7*  --   --  2.6*  --   --   HEPARINUNFRC 0.28*   < > 0.95*   < > 0.53 0.65 0.73*  --   CREATININE 0.97  --  0.99  --   --   --   --   --    < > = values in this interval not displayed.    Estimated Creatinine Clearance: 128.7 mL/min (by C-G formula based on SCr of 0.99 mg/dL).   Assessment: 45 yr old male admitted for a CVA. Patient was also found to have a LV thrombus on evaluation and was on no prior anticoagulation. Pharmacy was consulted to bridge heparin to warfarin for LV thrombus.   INR is 2.6 today, increased from 1.7 yesterday. Goal INR 2-2.5 per Neuro. Warfarin on hold today due to significant increase in INR and above goal of 2-2.5.   Heparin level 7.5 hrs after heparin infusion was decreased to 1150 units/hr was 0.73 units/ml, which remains above the goal for this pt. H/H 14.8/46.0, platelets 361 (CBC stable). Per RN, no issues with IV or bleeding observed.  Goal of Therapy:  Heparin level 0.3-0.5 units/ml Monitor platelets by anticoagulation protocol: Yes  INR goal 2-2.5, per neuro   Plan:  Reduce heparin infusion to 950 units/hr Check heparin level in 6 hrs Monitor daily heparin level, INR, CBC Monitor for  signs/symptoms of bleeding Hold warfarin today  05-11-1982, PharmD, BCPS, [facility] Clinical Pharmacist 07/26/2020

## 2020-07-26 NOTE — Progress Notes (Signed)
ANTICOAGULATION CONSULT NOTE  Pharmacy Consult for Heparin and Warfarin Indication: LV thrombus with recent CVA  No Known Allergies  Patient Measurements: Height: 6\' 1"  (185.4 cm) Weight: 121.6 kg (268 lb 1.3 oz) IBW/kg (Calculated) : 79.9 Heparin Dosing Weight: 105.6 kg  Vital Signs: Temp: 98.3 F (36.8 C) (01/06 0549) Temp Source: Axillary (01/06 0549) BP: 158/113 (01/06 0549) Pulse Rate: 81 (01/06 0549)  Labs: Recent Labs    07/24/20 0422 07/24/20 1414 07/25/20 0207 07/25/20 0623 07/25/20 1859 07/26/20 0155  HGB 13.3  --  14.2  --   --   --   HCT 41.1  --  42.2  --   --   --   PLT 301  --  325  --   --   --   LABPROT 15.7*  --  19.7*  --   --  26.6*  INR 1.3*  --  1.7*  --   --  2.6*  HEPARINUNFRC 0.28*   < > 0.95* 1.03* 0.53 0.65  CREATININE 0.97  --  0.99  --   --   --    < > = values in this interval not displayed.    Estimated Creatinine Clearance: 128.7 mL/min (by C-G formula based on SCr of 0.99 mg/dL).   Assessment: Patient is a 79 yom that is admitted for a CVA. Patient was also found to have a LV thrombus on evaluation and is on no prior anticoagulation. Pharmacy has been asked to bridge heparin at this time for LV thrombus.   Heparin now 0.65 on reduced rate, just above goal. INR is 2.6, increased from 1.7 yesterday. Goal INR 2-2.5 per Neuro.  No CBC done this AM.   No bleeding reported.  Will hold warfarin today due to significant increase in INR and above goal 2-2.5.   Goal of Therapy:  Heparin level 0.3-0.5 units/ml Monitor platelets by anticoagulation protocol: Yes  INR Goal 2-2.5 per neuro   Plan:  -Reduce heparin to 1150 units/h Check 6 hour heparin level Daily  HL , PT/INR and CBC Hold warfarin today.     54, RPh Clinical Pharmacist [phone] Please check AMION for all [facility] Pharmacy numbers 07/26/2020

## 2020-07-26 NOTE — Progress Notes (Signed)
Occupational Therapy Treatment Patient Details Name: Rodney Kane MRN: [id number] DOB: [birth year] Today's Date: 07/26/2020    History of present illness Pt is a 45 y.o. male admitted 07/19/20 with expressive difficulties and staggering gait; head CT with acute/subacute infarct in L posterior parietal lobe. MRI with large L MCA infarct with petechial hemorrhagic transporation, punctate R parietal cortical infarct. Pt also with sepsi, secondary to COVID, CHF/nonischemic cardiomyopathy. PMH includes CHF/nonischemic cardiomyopathy (EF 30-35%), HTN, DM2, medication noncompliance.   OT comments  Pt is able to perform ADLs at mod I - supervision level.  Cognition appears [facility] for familiar ADL tasks and he was able to perform path finding task without cues.  He was able to scan his environment adequately to avoid running into obstacles in hallway and to locate needed ADL items in room.  Communication deficits makes assessment of higher level cognitive deficits challenging as those will be best assessed during familiar tasks.  Recommend OPOT, that he have 24 hour supervision initially to ensure safety in his home environment, and no driving and no working until cleared by OPOT and SLP as well as MD.  Will follow   Follow Up Recommendations  Outpatient OT;Other (comment)    Equipment Recommendations  None recommended by OT    Recommendations for Other Services      Precautions / Restrictions Precautions Precautions: None       Mobility Bed Mobility                  Transfers Overall transfer level: Independent                    Balance Overall balance assessment: Mild deficits observed, not formally tested                                         ADL either performed or assessed with clinical judgement   ADL Overall ADL's : Needs assistance/impaired Eating/Feeding: Set up;Sitting   Grooming: Wash/dry hands;Wash/dry face;Oral care;Brushing  hair;Supervision/safety;Standing   Upper Body Bathing: Set up;Sitting   Lower Body Bathing: Supervison/ safety;Sit to/from stand   Upper Body Dressing : Set up;Sitting   Lower Body Dressing: Supervision/safety;Sit to/from stand   Toilet Transfer: Supervision/safety;Ambulation;Comfort height toilet   Toileting- Clothing Manipulation and Hygiene: Supervision/safety;Sit to/from stand       Functional mobility during ADLs: Supervision/safety General ADL Comments: pt able to locate all needed items without cues     Vision   Additional Comments: pt negotiated obstacles in hallway without cues, and located all needed items for ADL task.   Perception     Praxis      Cognition Arousal/Alertness: Awake/alert Behavior During Therapy: WFL for tasks assessed/performed Overall Cognitive Status: Difficult to assess                                 General Comments: Cognition intact for basic ADLs, and he was able to perform simple path finding around unit without cues.  He was able to recall his room number without cuing.  Due to communication deficits, higher level cognition will be best assessed during familiar functional activities        Exercises     Shoulder Instructions       General Comments VSS    Pertinent Vitals/ Pain  Pain Assessment: No/denies pain  Home Living                                          Prior Functioning/Environment              Frequency  Min 3X/week        Progress Toward Goals  OT Goals(current goals can now be found in the care plan section)  Progress towards OT goals: Progressing toward goals     Plan Discharge plan remains appropriate    Co-evaluation                 AM-PAC OT "6 Clicks" Daily Activity     Outcome Measure   Help from another person eating meals?: None Help from another person taking care of personal grooming?: None Help from another person toileting, which  includes using toliet, bedpan, or urinal?: A Little Help from another person bathing (including washing, rinsing, drying)?: A Little Help from another person to put on and taking off regular upper body clothing?: None Help from another person to put on and taking off regular lower body clothing?: A Little 6 Click Score: 21    End of Session    OT Visit Diagnosis: Unsteadiness on feet (R26.81);Other abnormalities of gait and mobility (R26.89);Cognitive communication deficit (R41.841);Muscle weakness (generalized) (M62.81) Symptoms and signs involving cognitive functions: Cerebral infarction   Activity Tolerance Patient tolerated treatment well   Patient Left Other (comment) (in BR - RN aware)   Nurse Communication Mobility status        Time: 4536-4680 OT Time Calculation (min): 22 min  Charges: OT General Charges $OT Visit: 1 Visit OT Treatments $Therapeutic Activity: 8-22 mins  Eber Jones., OTR/L Acute Rehabilitation Services Pager [phone] Office [phone]    Jeani Hawking M 07/26/2020, 6:09 PM

## 2020-07-26 NOTE — Progress Notes (Signed)
PROGRESS NOTE  Rodney Kane CBJ:[id number] DOB: 06/08/1976 DOA: 07/19/2020 PCP: Kallie Locks, FNP  Brief History   45 year old man PMH chronic combined systolic and diastolic CHF, nonischemic cardiomyopathy, diabetes, noncompliance presented with expressive aphasia and staggering gait.  Admitted for acute CVA, sepsis secondary to COVID-19, acute on chronic CHF, acute stroke, seen by neurology and cardiology.  Acute CVA with expressive aphasia and staggering gait on admission. --Seen by neurology with recommendation for warfarin, outpatient follow-up.  Continue statin.  Outpatient PT, OT  Left ventricular thrombus --Heparin infusion, bridged to therapeutic warfarin.Target INR 2-2.5  COVID-19, sepsis considered on admission, ruled out. --Stable, no hypoxia, treated with remdesivir and steroids. --Treated with empiric antibiotics based on procalcitonin, no apparent bacterial infection noted.  Acute on chronic combined systolic, diastolic CHF, nonischemic cardiomyopathy --Started on losartan per cardiology, continue carvedilol  Essential hypertension --Stable, cardiology adjusting medications  Diabetes mellitus type 2 hemoglobin A1c 7.7 --CBG stable, continue Lantus, Tradgenta and glipizide  Prolonged QT --check EKG in AM  Disposition Plan:  Discussion: Stable, if INR within range tomorrow, discharge home so long as can arrange for INR checks  Status is: Inpatient  Remains inpatient appropriate because:IV treatments appropriate due to intensity of illness or inability to take PO   Dispo: The patient is from: Home              Anticipated d/c is to: Home              Anticipated d/c date is: 2 days              Patient currently is not medically stable to d/c.  DVT prophylaxis: Place and maintain sequential compression device Start: 07/20/20 0014   Code Status: Full Code Family Communication: none  Brendia Sacks, MD  [facility] Direct contact: see [email or web address]  (further directions at bottom of note if needed) 7PM-7AM contact night coverage as at bottom of note 07/26/2020, 5:05 PM  LOS: 7 days    Consults:  . Cardiology . neurology   Interval History/Subjective  CC: f/u stroke  No new issues, feels fine, no complaints.  Objective   Vitals:  Vitals:   07/26/20 0800 07/26/20 1646  BP:  126/84  Pulse:  79  Resp: 20 (!) 28  Temp:  97.9 F (36.6 C)  SpO2: 97% 97%    Exam:  Constitutional:   . Appears calm and comfortable ENMT:  . grossly normal hearing  Respiratory:  . CTA bilaterally, no w/r/r.  . Respiratory effort normal.  Cardiovascular:  . RRR, no m/r/g Psychiatric:  . Mental status o Mood, affect appropriate  I have personally reviewed the following:   Today's Data  . CBG stable . INR up to 2.6  Scheduled Meds: . vitamin C  500 mg Oral Daily  . atorvastatin  80 mg Oral Daily  . carvedilol  12.5 mg Oral BID WC  . cholecalciferol  1,000 Units Oral Daily  . glipiZIDE  10 mg Oral BID AC  . insulin aspart  0-5 Units Subcutaneous QHS  . insulin aspart  0-9 Units Subcutaneous TID WC  . insulin aspart  3 Units Subcutaneous TID WC  . insulin glargine  5 Units Subcutaneous Daily  . linagliptin  5 mg Oral Daily  . losartan  25 mg Oral BID  . pantoprazole  40 mg Oral Daily  . predniSONE  40 mg Oral Q breakfast  . Warfarin - Pharmacist Dosing Inpatient   Does not apply q1600  . zinc  sulfate  220 mg Oral Daily   Continuous Infusions: . heparin 1,150 Units/hr (07/26/20 1355)    Principal Problem:   Acute CVA (cerebrovascular accident) (HCC) Active Problems:   Diabetes (HCC)   Acute combined systolic and diastolic heart failure (HCC)   COVID-19 virus infection   Sinus tachycardia   Left ventricular thrombosis   Prolonged QT interval   LOS: 7 days   How to contact the [facility] Attending or Consulting provider 7A - 7P or covering provider during after hours 7P -7A, for this patient?  1. Check the care team in [facility] and  look for a) attending/consulting TRH provider listed and b) the [facility] team listed 2. Log into [email or web address] and use [place]'s universal password to access. If you do not have the password, please contact the hospital operator. 3. Locate the [facility] provider you are looking for under [facility] and page to a number that you can be directly reached. 4. If you still have difficulty reaching the provider, please page the [facility] (Director on Call) for the Hospitalists listed on amion for assistance.

## 2020-07-26 NOTE — Progress Notes (Signed)
      Case reviewed    INR therapeutic   No new recommendations    Will make sure he hs f/u as outpt.  Dietrich Pates MD

## 2020-07-26 NOTE — Consult Note (Signed)
   [facility] CM Inpatient Consult   07/26/2020  Rodney Kane 25-Jan-1976 [id number]  [facility] [[facility]]  Accountable Care Organization [ACO] Patient: [name]   Patient screened for ED visits and hospitalization to check if potential [facility] Customer [facility] manager  [[facility]] Care Management service needs.  Review of patient's medical record reveals patient is being recommended for outpatient rehab per PT notes, diagnosed with CVA and COVID noted.  Primary Care Provider is  Kallie Locks, FNP this provider is listed to provide the transition of care [TOC] for post hospital follow up.   Plan: *Patient is being recommended for Outpatient Rehab or PT/OT/SLP. Continue to follow progress and disposition to assess for post hospital care management needs.    Please place a [facility] Care Management consult as appropriate and for questions contact:   Charlesetta Shanks, RN BSN CCM [facility]  [phone] business mobile phone Toll free office [phone]  Fax number: [phone] [email or web address]

## 2020-07-26 NOTE — TOC Initial Note (Signed)
Transition of Care [facility]) - Initial/Assessment Note    Patient Details  Name: Rodney Kane MRN: [id number] Date of Birth: 1975-12-10  Transition of Care [facility]) CM/SW Contact:    Lockie Pares, RN Phone Number: 07/26/2020, 5:57 PM  Clinical Narrative:                 Admitted for Sepsis, CVA  Patient will need INR post hospitalization, Lives in [facility], is not eligible for Remote, dur to not being in [place]. Will  Make appointment with Dr Bradly Chris for follow up.  Expected Discharge Plan: Home w Home Health Services     Patient Goals and CMS Choice        Expected Discharge Plan and Services Expected Discharge Plan: Home w Home Health Services                                              Prior Living Arrangements/Services                       Activities of Daily Living Home Assistive Devices/Equipment: None ADL Screening (condition at time of admission) Patient's cognitive ability adequate to safely complete daily activities?: No Is the patient deaf or have difficulty hearing?: No Does the patient have difficulty seeing, even when wearing glasses/contacts?: No Does the patient have difficulty concentrating, remembering, or making decisions?: Yes Patient able to express need for assistance with ADLs?: No Does the patient have difficulty dressing or bathing?: No Independently performs ADLs?: Yes (appropriate for developmental age) Does the patient have difficulty walking or climbing stairs?: No Weakness of Legs: None Weakness of Arms/Hands: None  Permission Sought/Granted                  Emotional Assessment              Admission diagnosis:  MRI contraindicated due to metal implant [Z53.09] Altered mental status, unspecified altered mental status type [R41.82] Cerebrovascular accident (CVA), unspecified mechanism (HCC) [I63.9] COVID-19 virus infection [U07.1] COVID-19 [U07.1] Patient Active Problem List   Diagnosis  Date Noted  . 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 Prolonged QT interval 07/25/2020  . Sinus tachycardia 07/20/2020  . Acute CVA (cerebrovascular accident) (HCC) 07/20/2020  . COVID-19 virus infection 07/19/2020  . Noncompliance with medication regimen 01/04/2020  . Hyperglycemia 01/04/2020  . Urine ketones 01/04/2020  . Essential hypertension 01/04/2020  . NICM (nonischemic cardiomyopathy) (HCC) 01/16/2017  . 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 Acute combined systolic and diastolic heart failure (HCC)   . Elevated troponin   . 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 Diabetes (HCC) 01/02/2017   PCP:  Kallie Locks, FNP Pharmacy:   [facility] [address] [place]), [place] - [address] [place] [address] Phone: [phone] Fax: [phone]     Social Determinants of Health (SDOH) Interventions    Readmission Risk Interventions No flowsheet data found.

## 2020-07-27 DIAGNOSIS — E118 Type 2 diabetes mellitus with unspecified complications: Secondary | ICD-10-CM

## 2020-07-27 DIAGNOSIS — I5041 Acute combined systolic (congestive) and diastolic (congestive) heart failure: Secondary | ICD-10-CM | POA: Diagnosis not present

## 2020-07-27 DIAGNOSIS — I513 Intracardiac thrombosis, not elsewhere classified: Secondary | ICD-10-CM | POA: Diagnosis not present

## 2020-07-27 LAB — CBC
HCT: 41.1 % (ref 39.0–52.0)
Hemoglobin: 14 g/dL (ref 13.0–17.0)
MCH: 26.2 pg (ref 26.0–34.0)
MCHC: 34.1 g/dL (ref 30.0–36.0)
MCV: 76.8 fL — ABNORMAL LOW (ref 80.0–100.0)
Platelets: 338 10*3/uL (ref 150–400)
RBC: 5.35 MIL/uL (ref 4.22–5.81)
RDW: 13.7 % (ref 11.5–15.5)
WBC: 15.4 10*3/uL — ABNORMAL HIGH (ref 4.0–10.5)
nRBC: 0 % (ref 0.0–0.2)

## 2020-07-27 LAB — GLUCOSE, CAPILLARY
Glucose-Capillary: 154 mg/dL — ABNORMAL HIGH (ref 70–99)
Glucose-Capillary: 216 mg/dL — ABNORMAL HIGH (ref 70–99)
Glucose-Capillary: 253 mg/dL — ABNORMAL HIGH (ref 70–99)
Glucose-Capillary: 249 mg/dL — ABNORMAL HIGH (ref 70–99)

## 2020-07-27 LAB — HEPARIN LEVEL (UNFRACTIONATED): Heparin Unfractionated: 0.54 IU/mL (ref 0.30–0.70)

## 2020-07-27 LAB — PROTIME-INR
INR: 2.6 — ABNORMAL HIGH (ref 0.8–1.2)
Prothrombin Time: 27.2 seconds — ABNORMAL HIGH (ref 11.4–15.2)

## 2020-07-27 MED ORDER — WARFARIN SODIUM 5 MG PO TABS
5.0000 mg | ORAL_TABLET | Freq: Once | ORAL | Status: AC
  Administered 2020-07-27: 5 mg via ORAL
  Filled 2020-07-27: qty 1

## 2020-07-27 NOTE — TOC Transition Note (Addendum)
Transition of Care [facility]) - CM/SW Discharge Note   Patient Details  Name: Denorris Reust MRN: [id number] Date of Birth: 1976-01-02  Transition of Care [facility]) CM/SW Contact:  Lockie Pares, RN Phone Number: 07/27/2020, 2:30 PM   Clinical Narrative:     Secured home health [facility] for patient PT OT SLPMD aware They will have RN come out to draw PT and INR on Tuesday let MD know.  Patient will reside with son upon discharge [address] [place]. Called son to verify his address, he will transport him hiome when discharged [phone]   Final next level of care: Home w Home Health Services Barriers to Discharge: No Home Care Agency will accept this patient   Patient Goals and CMS Choice        Discharge Placement                       Discharge Plan and Services                                  Representative spoke with at [facility] Agency: have called [facility], [facility], [facility] declined  Social Determinants of Health (SDOH) Interventions     Readmission Risk Interventions No flowsheet data found.

## 2020-07-27 NOTE — Progress Notes (Signed)
ANTICOAGULATION CONSULT NOTE  Pharmacy Consult for Heparin and Warfarin Indication: LV thrombus with recent CVA  No Known Allergies  Patient Measurements: Height: 6\' 1"  (185.4 cm) Weight: 121.2 kg (267 lb 3.2 oz) IBW/kg (Calculated) : 79.9 Heparin Dosing Weight: 105.6 kg  Vital Signs: Temp: 98 F (36.7 C) (01/07 0400) Temp Source: Oral (01/07 0400) BP: 126/85 (01/07 0400) Pulse Rate: 77 (01/07 0400)  Labs: Recent Labs    07/25/20 0207 07/25/20 0623 07/26/20 0155 07/26/20 1624 07/26/20 1724 07/27/20 0215  HGB 14.2  --   --   --  14.8 14.0  HCT 42.2  --   --   --  46.0 41.1  PLT 325  --   --   --  361 338  LABPROT 19.7*  --  26.6*  --   --  27.2*  INR 1.7*  --  2.6*  --   --  2.6*  HEPARINUNFRC 0.95*   < > 0.65 0.73*  --  0.54  CREATININE 0.99  --   --   --   --   --    < > = values in this interval not displayed.    Estimated Creatinine Clearance: 128.5 mL/min (by C-G formula based on SCr of 0.99 mg/dL).   Assessment: 45 yr old male admitted for a CVA. Patient was also found to have a LV thrombus on evaluation and was on no prior anticoagulation.   INR has been 2.6 x 2 days  Continues on heparin bridge  Goal of Therapy:  Monitor platelets by anticoagulation protocol: Yes  INR goal 2-2.5, per neuro   Plan:  DC heparin Warfarin 5 mg x 1 Daily INR  54, PharmD, BCPS, BCCCP Clinical Pharmacist  Please check AMION for all [facility] Pharmacy numbers  07/27/2020 11:28 AM

## 2020-07-27 NOTE — Progress Notes (Signed)
      Pt will need follow up for coumadin as outpt. Unfortunately cannot come to our coumadin clinic until 08/09/20  We can review INRs remotely but his will need to be organized for draws with [facility]    Signed, Dietrich Pates, MD  07/27/2020, 8:29 AM

## 2020-07-27 NOTE — Progress Notes (Signed)
ANTICOAGULATION CONSULT NOTE  Pharmacy Consult for Heparin and Warfarin Indication: LV thrombus with recent CVA  No Known Allergies  Patient Measurements: Height: 6\' 1"  (185.4 cm) Weight: 121.6 kg (268 lb 1.3 oz) IBW/kg (Calculated) : 79.9 Heparin Dosing Weight: 105.6 kg  Vital Signs: Temp: 97.9 F (36.6 C) (01/06 1646) Temp Source: Oral (01/06 1646) BP: 126/84 (01/06 1646) Pulse Rate: 79 (01/06 1646)  Labs: Recent Labs    07/24/20 0422 07/24/20 1414 07/25/20 0207 07/25/20 0623 07/26/20 0155 07/26/20 1624 07/26/20 1724 07/27/20 0215  HGB 13.3  --  14.2  --   --   --  14.8 14.0  HCT 41.1  --  42.2  --   --   --  46.0 41.1  PLT 301  --  325  --   --   --  361 338  LABPROT 15.7*  --  19.7*  --  26.6*  --   --   --   INR 1.3*  --  1.7*  --  2.6*  --   --   --   HEPARINUNFRC 0.28*   < > 0.95*   < > 0.65 0.73*  --  0.54  CREATININE 0.97  --  0.99  --   --   --   --   --    < > = values in this interval not displayed.    Estimated Creatinine Clearance: 128.7 mL/min (by C-G formula based on SCr of 0.99 mg/dL).   Assessment: 45 yr old male admitted for a CVA. Patient was also found to have a LV thrombus on evaluation and was on no prior anticoagulation. Pharmacy was consulted to bridge heparin to warfarin for LV thrombus.   Heparin level supratherapeutic (0.54) on gtt at 950 units/hr. No bleeding noted.  Goal of Therapy:  Heparin level 0.3-0.5 units/ml Monitor platelets by anticoagulation protocol: Yes  INR goal 2-2.5, per neuro   Plan:  Reduce heparin infusion to 850 units/hr Check heparin level in 6 hours  54, PharmD, BCPS Please see amion for complete clinical pharmacist phone list 07/27/2020

## 2020-07-27 NOTE — Discharge Summary (Addendum)
Physician Discharge Summary  Jyron Turman YKD:[id number] DOB: 02-Apr-1976 DOA: 07/19/2020  PCP: Azzie Glatter, FNP  Admit date: 07/19/2020 Discharge date: 07/28/2020  Recommendations for Outpatient Follow-up:   Acute CVA with expressive aphasia and staggering gait on admission.  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 INR check 1/11 per [facility], HH to coordinate results and management with [facility] warfarin clinic.  Chronic combined systolic, diastolic CHF, nonischemic cardiomyopathy  Cannot drive or work until cleared by outpt therapy     Follow-up Information    Rodney Kane, Krista M., PA-C Follow up on 08/10/2020.   Specialties: Physician Assistant, Cardiology Why: Please arrive 15 minutes early for your 2:15pm post-hospital cardiology appointment Contact information: [address] [place] [address] [place] Follow up.   Why: go on MOnday               Discharge Diagnoses: Principal diagnosis is #1 Principal Problem:   Acute CVA (cerebrovascular accident) ([place]) Active Problems:   Diabetes ([place])   Acute combined systolic and diastolic heart failure ([place])   COVID-19 virus infection   Sinus tachycardia   Left ventricular thrombosis   Prolonged QT interval   Discharge Condition: improved Disposition: home w/ HHPT, OT, SLP, RN   Diet recommendation:  Diet Orders (From admission, onward)    Start     Ordered   07/28/20 0000  Diet - low sodium heart healthy        07/28/20 0952   07/20/20 1113  Diet Carb Modified Fluid consistency: Thin; Room service appropriate? Yes  Diet effective now       Question Answer Comment  Diet-HS Snack? Nothing   Calorie Level Medium 1600-2000   Fluid consistency: Thin   Room service appropriate? Yes      07/20/20 1112           Filed Weights   07/26/20 0500 07/27/20 0500 07/28/20 0456  Weight: 121.6 kg 121.2 kg 120.9 kg    HPI/Hospital Course:    45 year old man PMH chronic combined systolic and diastolic CHF, nonischemic cardiomyopathy, diabetes, noncompliance presented with expressive aphasia and staggering gait.  Admitted for acute CVA, sepsis secondary to COVID-19, acute on chronic CHF, acute stroke, seen by neurology and cardiology.  Responded well to treatment for Covid, which has resolved at this point.  Followed by neurology with recommendation for warfarin.  Found to have left ventricular thrombus, hospitalization prolonged by need to bridge with heparin to therapeutic warfarin. Seen by therapy w/ recommendations for PT,OT,SLP,RN after discharge.  Only barrier was finding HH that can draw INR. This was secured, will have [facility] draw on Tuesday 1/11. Cannot go to Coumadin clinic until 1/20.  Acute CVA with expressive aphasia and staggering gait on admission. --Seen by neurology --recommendation was warfarin and outpatient follow-up.  Continue statin.   --PT, OT, SLP to continue after discharge  Left ventricular thrombus --treated w/ heparin bridge to therapeutic warfarin. --now therapeutic 3 days (goal INR 2-2.5) --INR per [facility] until able to go to warfarin clinic 08/09/20 --will d/c on warfarin as recommended by pharmacy, 63m QHS, [facility].  COVID-19, sepsis considered on admission, ruled out. --resolved, no hypoxia. Treated with remdesivir and steroids. --Treated with empiric antibiotics based on procalcitonin, no apparent bacterial infection noted.  Acute on chronic combined systolic, diastolic CHF, nonischemic cardiomyopathy --appears euvolemic, will continue losartan per cardiology, continue carvedilol, has  not required diuretic. Weight stable. I/O not accurate.  Essential hypertension --stable  Diabetes mellitus type 2 hemoglobin A1c 7.7 --CBG remains stable --continue Lantus at lower dose and titrate up back to previous dose as per PCP guidance, started on  linagliptin, continue glipizide as outpatient --stop metformin  Prolonged QT --resolved. EKG 1/7 reviewed independently, normal QTc. Did not cross over to Epic.  Consults:   Cardiology  neurology   Today's assessment: S: CC: stroke  Feels fine, no complaints.  O: Vitals:  Vitals:   07/27/20 2026 07/28/20 0456  BP: (!) 134/101 132/89  Pulse: 85 84  Resp: 18 18  Temp: 98.7 F (37.1 C) 98.5 F (36.9 C)  SpO2: 100% 98%    Constitutional:  . Appears calm and comfortable ENMT:  . grossly normal hearing  Respiratory:  . CTA bilaterally, no w/r/r.  . Respiratory effort normal. Cardiovascular:  . RRR, no m/r/g . No LE extremity edema   Psychiatric:  . Mental status o Mood, affect appropriate  CBG stable INR 2.2 CBC stable  Discharge Instructions  Discharge Instructions    Ambulatory referral to Neurology   Complete by: As directed    An appointment is requested in approximately: 4 weeks   Diet - low sodium heart healthy   Complete by: As directed    Discharge instructions   Complete by: As directed    Call your physician or seek immediate medical attention for weakness, numbness, difficulty walking, speaking or swallowing, fever or worsening of condition. Self-isolate through 1/19. No working or driving until cleared by outpatient therapist.   Increase activity slowly   Complete by: As directed      Allergies as of 07/28/2020   No Known Allergies     Medication List    STOP taking these medications   amLODipine 10 MG tablet Commonly known as: NORVASC   aspirin 81 MG EC tablet   DAYQUIL PO   metFORMIN 1000 MG tablet Commonly known as: GLUCOPHAGE   omeprazole 40 MG capsule Commonly known as: PRILOSEC     TAKE these medications   ascorbic acid 500 MG tablet Commonly known as: VITAMIN C Take 1 tablet (500 mg total) by mouth daily.   atorvastatin 10 MG tablet Commonly known as: LIPITOR Take 1 tablet (10 mg total) by mouth daily.   blood  glucose meter kit and supplies Dispense based on patient and insurance preference. Use up to four times daily for blood glucose readings greater than 125.   carvedilol 12.5 MG tablet Commonly known as: COREG Take 1 tablet (12.5 mg total) by mouth 2 (two) times daily with a meal.   glipiZIDE 10 MG tablet Commonly known as: GLUCOTROL Take 1 tablet (10 mg total) by mouth 2 (two) times daily before a meal.   insulin aspart 100 UNIT/ML injection Commonly known as: NovoLOG Inject 5 Units into the skin 3 (three) times daily with meals. What changed: how much to take   insulin glargine 100 unit/mL Sopn Commonly known as: LANTUS Inject 10 Units into the skin at bedtime. What changed: how much to take   linagliptin 5 MG Tabs tablet Commonly known as: TRADJENTA Take 1 tablet (5 mg total) by mouth daily.   losartan 25 MG tablet Commonly known as: COZAAR Take 1 tablet (25 mg total) by mouth in the morning and at bedtime. What changed: when to take this   potassium chloride SA 20 MEQ tablet Commonly known as: KLOR-CON Take 1 tablet (20 mEq total) by mouth  daily.   Vitamin D (Ergocalciferol) 1.25 MG (50000 UNIT) Caps capsule Commonly known as: DRISDOL Take 1 capsule (50,000 Units total) by mouth every 7 (seven) days. What changed: when to take this   warfarin 5 MG tablet Commonly known as: COUMADIN Take 1 tablet (5 mg total) by mouth daily at 4 PM.   zinc sulfate 220 (50 Zn) MG capsule Take 1 capsule (220 mg total) by mouth daily.      No Known Allergies  The results of significant diagnostics from this hospitalization (including imaging, microbiology, ancillary and laboratory) are listed below for reference.    Significant Diagnostic Studies: CT Angio Head W or Wo Contrast  Result Date: 07/19/2020 CLINICAL DATA:  Stroke-like symptoms. Expressive aphasia, headache, confusion EXAM: CT ANGIOGRAPHY HEAD AND NECK TECHNIQUE: Multidetector CT imaging of the head and neck was  performed using the Kane protocol during bolus administration of intravenous contrast. Multiplanar CT image reconstructions and MIPs were obtained to evaluate the vascular anatomy. Carotid stenosis measurements (when applicable) are obtained utilizing NASCET criteria, using the distal internal carotid diameter as the denominator. CONTRAST:  39mL OMNIPAQUE IOHEXOL 350 MG/ML SOLN COMPARISON:  CT head 07/19/2020 FINDINGS: CTA NECK FINDINGS Aortic arch: Normal aortic arch and proximal great vessels. Bovine branching pattern. Right carotid system: Normal right carotid. Negative for stenosis or dissection. Left carotid system: Normal left carotid. Negative for stenosis or dissection. Vertebral arteries: Both vertebral arteries are normal and patent to the basilar. Right vertebral artery dominant. Skeleton: No acute skeletal abnormality. Other neck: Negative for mass or adenopathy in the neck. Upper chest: Small bilateral effusions. Patchy nodular airspace disease left upper lobe, not present on CT chest 07/17/2020. Possible pneumonia or edema. Review of the MIP images confirms the above findings CTA HEAD FINDINGS Anterior circulation: Normal cavernous carotid. Right anterior and middle cerebral arteries widely patent. Left anterior cerebral artery patent. Left M1 segment widely patent. Left MCA bifurcation normal. No large vessel occlusion. There is an area of hypoperfusion in the left parietal lobe corresponding to the hypodensity. This is consistent with recent infarct with distal MCA occlusion Posterior circulation: Both vertebral arteries patent to the basilar. PICA patent bilaterally. Basilar widely patent. Posterior circulation normal without stenosis or large vessel occlusion. Venous sinuses: Normal venous enhancement. Anatomic variants: None Review of the MIP images confirms the above findings IMPRESSION: 1. Normal carotid and vertebral arteries in the neck 2. No intracranial large vessel occlusion. There is a  small area of hypoperfusion in the left parietal lobe consistent with subacute infarct. This area shows hypodensity on CT. Electronically Signed   By: Marlan Palau M.D.   On: 07/19/2020 20:58   DG Abd 1 View  Result Date: 07/20/2020 CLINICAL DATA:  Pre MRI EXAM: ABDOMEN - 1 VIEW COMPARISON:  CT 02/24/2020 FINDINGS: No implantable device seen. Patient's metallic belt buckle and zipper noted. Nonobstructive bowel gas pattern. No organomegaly or free air. IMPRESSION: No visible implantable device. Electronically Signed   By: Charlett Nose M.D.   On: 07/20/2020 07:35   CT Head Wo Contrast  Result Date: 07/19/2020 CLINICAL DATA:  Altered mental status. Stroke-like symptoms for 4 days. Expressive aphasia, headache, and confusion. EXAM: CT HEAD WITHOUT CONTRAST TECHNIQUE: Contiguous axial images were obtained from the base of the skull through the vertex without intravenous contrast. COMPARISON:  None. FINDINGS: Brain: Poorly defined area of low attenuation change extending to the cortical surface and involving the left posterior parietal lobe. This is consistent with acute or subacute infarct. Mild  mass effect with effacement of sulci and left lateral ventricle. No midline shift. No abnormal extra-axial fluid collections. No acute intracranial hemorrhage. No ventricular dilatation. Vascular: Mild intracranial arterial vascular calcifications. Skull: Calvarium appears intact. Sinuses/Orbits: Paranasal sinuses and mastoid air cells are clear. Other: None. IMPRESSION: Low-attenuation in the left posterior parietal lobe consistent with acute or subacute infarct. Mild mass effect without midline shift. No acute intracranial hemorrhage. Electronically Signed   By: Burman Nieves M.D.   On: 07/19/2020 19:36   CT Angio Neck W and/or Wo Contrast  Result Date: 07/19/2020 CLINICAL DATA:  Stroke-like symptoms. Expressive aphasia, headache, confusion EXAM: CT ANGIOGRAPHY HEAD AND NECK TECHNIQUE: Multidetector CT  imaging of the head and neck was performed using the Kane protocol during bolus administration of intravenous contrast. Multiplanar CT image reconstructions and MIPs were obtained to evaluate the vascular anatomy. Carotid stenosis measurements (when applicable) are obtained utilizing NASCET criteria, using the distal internal carotid diameter as the denominator. CONTRAST:  37mL OMNIPAQUE IOHEXOL 350 MG/ML SOLN COMPARISON:  CT head 07/19/2020 FINDINGS: CTA NECK FINDINGS Aortic arch: Normal aortic arch and proximal great vessels. Bovine branching pattern. Right carotid system: Normal right carotid. Negative for stenosis or dissection. Left carotid system: Normal left carotid. Negative for stenosis or dissection. Vertebral arteries: Both vertebral arteries are normal and patent to the basilar. Right vertebral artery dominant. Skeleton: No acute skeletal abnormality. Other neck: Negative for mass or adenopathy in the neck. Upper chest: Small bilateral effusions. Patchy nodular airspace disease left upper lobe, not present on CT chest 07/17/2020. Possible pneumonia or edema. Review of the MIP images confirms the above findings CTA HEAD FINDINGS Anterior circulation: Normal cavernous carotid. Right anterior and middle cerebral arteries widely patent. Left anterior cerebral artery patent. Left M1 segment widely patent. Left MCA bifurcation normal. No large vessel occlusion. There is an area of hypoperfusion in the left parietal lobe corresponding to the hypodensity. This is consistent with recent infarct with distal MCA occlusion Posterior circulation: Both vertebral arteries patent to the basilar. PICA patent bilaterally. Basilar widely patent. Posterior circulation normal without stenosis or large vessel occlusion. Venous sinuses: Normal venous enhancement. Anatomic variants: None Review of the MIP images confirms the above findings IMPRESSION: 1. Normal carotid and vertebral arteries in the neck 2. No intracranial  large vessel occlusion. There is a small area of hypoperfusion in the left parietal lobe consistent with subacute infarct. This area shows hypodensity on CT. Electronically Signed   By: Marlan Palau M.D.   On: 07/19/2020 20:58   CT Angio Chest PE W and/or Wo Contrast  Result Date: 07/17/2020 CLINICAL DATA:  Dyspnea, left chest pain, productive cough EXAM: CT ANGIOGRAPHY CHEST WITH CONTRAST TECHNIQUE: Multidetector CT imaging of the chest was performed using the Kane protocol during bolus administration of intravenous contrast. Multiplanar CT image reconstructions and MIPs were obtained to evaluate the vascular anatomy. CONTRAST:  88mL OMNIPAQUE IOHEXOL 350 MG/ML SOLN COMPARISON:  None FINDINGS: Cardiovascular: There is adequate opacification of the pulmonary arterial tree. There is no intraluminal filling defect identified to suggest acute pulmonary embolism. Central pulmonary arteries are of normal caliber. No significant coronary artery calcification. Global cardiac size is within normal limits. Mild left ventricular dilation, however, is noted. No pericardial effusion. The thoracic aorta is unremarkable. Mediastinum/Nodes: No enlarged mediastinal, hilar, or axillary lymph nodes. Thyroid gland, trachea, and esophagus demonstrate no significant findings. Lungs/Pleura: Small right pleural effusion is present with minimal associated right basilar atelectasis. The lungs are otherwise clear. No pneumothorax. No  central obstructing lesion. Upper Abdomen: No acute abnormality. Musculoskeletal: Osseous structures are age-appropriate. Gynecomastia noted. Review of the MIP images confirms the above findings. IMPRESSION: No pulmonary embolism. Mild left ventricular dilation. This could be better assessed with echocardiography. Small right pleural effusion. Electronically Signed   By: Fidela Salisbury MD   On: 07/17/2020 01:55   MR Brain Wo Contrast (neuro protocol)  Result Date: 07/20/2020 CLINICAL DATA:   45 year old male with stroke-like symptoms for 4 days. 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 COVID-19. EXAM: MRI HEAD WITHOUT CONTRAST TECHNIQUE: Multiplanar, multiecho pulse sequences of the brain and surrounding structures were obtained without intravenous contrast. COMPARISON:  CT head, CTA head and neck yesterday. FINDINGS: Brain: Confluent restricted diffusion from the posterior temporal lobe through the lateral left occipital lobe tracking to the left occipital pole, an area of 9.5 cm (series 2, image 29) corresponding to the CT finding yesterday. Multifocal petechial hemorrhage (series 7, image 61) and confluent cytotoxic edema but no malignant hemorrhagic transformation and only mild mass effect on the left lateral ventricle without midline shift. Superimposed small cortical restricted diffusion at the right superior parietal lobe (series 2, image 44). Mild if any associated T2 and FLAIR hyperintensity. No hemorrhage. No other restricted diffusion. No midline shift, evidence of mass lesion, ventriculomegaly, extra-axial collection. Patchy and nodular superimposed bilateral cerebral white matter T2 and FLAIR hyperintensity in a nonspecific configuration. The corpus callosum and temporal lobes are spared. No chronic cortical encephalomalacia. But there do appear to be several chronic microhemorrhages in the left lentiform nuclei (series 7, images 52 and 56). No other chronic cerebral blood products. Elsewhere the deep gray nuclei, brainstem and cerebellum are within normal limits. Cervicomedullary junction and pituitary are within normal limits. Vascular: Major intracranial vascular flow voids are preserved. Skull and upper cervical spine: Negative. Sinuses/Orbits: Disconjugate gaze otherwise negative orbits. Paranasal sinuses and mastoids are stable and well pneumatized. Other: None. IMPRESSION: 1. Large acute infarct in the posterior left hemisphere  with petechial hemorrhage and cytotoxic edema, but no malignant hemorrhagic transformation or significant intracranial mass effect. 2. Solitary small acute cortical infarct superimposed in the superior right parietal lobe. 3. Chronic micro-hemorrhages in the left lentiform nuclei. Bilateral periventricular white matter signal changes which may therefore be chronic small vessel disease related. Electronically Signed   By: Genevie Ann M.D.   On: 07/20/2020 12:00   DG Chest Portable 1 View  Result Date: 07/19/2020 CLINICAL DATA:  Fever, altered level of consciousness EXAM: PORTABLE CHEST 1 VIEW COMPARISON:  07/17/2020 FINDINGS: Single frontal view of the chest demonstrates an enlarged cardiac silhouette. Increased central vascular congestion, with interval development of bilateral interstitial prominence. No large effusion or pneumothorax. No acute bony abnormalities. IMPRESSION: 1. Worsening volume status, with mild interstitial edema on today's exam. Electronically Signed   By: Randa Ngo M.D.   On: 07/19/2020 18:49   DG Chest Port 1 View  Result Date: 07/16/2020 CLINICAL DATA:  Intermittent sharp left-sided chest pain, short of breath, productive cough EXAM: PORTABLE CHEST 1 VIEW COMPARISON:  05/29/2018 FINDINGS: The heart size and mediastinal contours are within normal limits. Both lungs are clear. The visualized skeletal structures are unremarkable. IMPRESSION: No active disease. Electronically Signed   By: Randa Ngo M.D.   On: 07/16/2020 17:21   ECHOCARDIOGRAM COMPLETE  Result Date: 07/20/2020    ECHOCARDIOGRAM REPORT   Patient Name:   Rodney Kane Date of Exam: 07/20/2020 Medical Rec #:  [id number]  Height:       73.0 in Accession #:    [id number]    Weight:       262.3 lb Date of Birth:  25-Sep-1975     BSA:          2.415 m Patient Age:    45 years      BP:           153/106 mmHg Patient Gender: M             HR:           108 bpm. Exam Location:  Inpatient Procedure: 2D Echo, Color  Doppler, Cardiac Doppler and Intracardiac            Opacification Agent Indications:    CHF-Acute Systolic N56.21  History:        Patient has prior history of Echocardiogram examinations, most                 recent 01/04/2017. Risk Factors:Hypertension, Dyslipidemia and                 Diabetes.  Sonographer:    Bernadene Person RDCS Referring Phys: [id number] [place]  1. A small (8 mm diameter), almost spherical, slightly mobile ventricular apical thrombus is seen. Left ventricular ejection fraction, by estimation, is 20 to 25%. The left ventricle has severely decreased function. The left ventricle demonstrates global hypokinesis. The left ventricular internal cavity size was moderately dilated. Indeterminate diastolic filling due to E-A fusion.  2. Right ventricular systolic function is mildly reduced. The right ventricular size is normal. Tricuspid regurgitation signal is inadequate for assessing PA pressure.  3. Left atrial size was moderately dilated.  4. A small pericardial effusion is present. The pericardial effusion is localized near the right atrium.  5. The mitral valve is normal in structure. No evidence of mitral valve regurgitation. No evidence of mitral stenosis.  6. The aortic valve is tricuspid. Aortic valve regurgitation is not visualized. No aortic stenosis is present.  7. The inferior vena cava is normal in size with greater than 50% respiratory variability, suggesting right atrial pressure of 3 mmHg. Comparison(s): Prior images unable to be directly viewed, comparison made by report only. The left ventricular function is worsened. A new apical LV thrombus is seen. FINDINGS  Left Ventricle: A small (8 mm diameter), almost spherical, slightly mobile ventricular apical thrombus is seen. Left ventricular ejection fraction, by estimation, is 20 to 25%. The left ventricle has severely decreased function. The left ventricle demonstrates global hypokinesis. Definity contrast agent was  given IV to delineate the left ventricular endocardial borders. The left ventricular internal cavity size was moderately dilated. There is no left ventricular hypertrophy. Indeterminate diastolic filling due to E-A fusion. Right Ventricle: The right ventricular size is normal. No increase in right ventricular wall thickness. Right ventricular systolic function is mildly reduced. Tricuspid regurgitation signal is inadequate for assessing PA pressure. Left Atrium: Left atrial size was moderately dilated. Right Atrium: Right atrial size was normal in size. Pericardium: A small pericardial effusion is present. The pericardial effusion is localized near the right atrium. Mitral Valve: The mitral valve is normal in structure. No evidence of mitral valve regurgitation. No evidence of mitral valve stenosis. Tricuspid Valve: The tricuspid valve is normal in structure. Tricuspid valve regurgitation is not demonstrated. Aortic Valve: The aortic valve is tricuspid. Aortic valve regurgitation is not visualized. No aortic stenosis is present. Pulmonic Valve: The pulmonic valve was grossly normal. Pulmonic  valve regurgitation is not visualized. Aorta: The aortic root and ascending aorta are structurally normal, with no evidence of dilitation. Venous: The inferior vena cava is normal in size with greater than 50% respiratory variability, suggesting right atrial pressure of 3 mmHg. IAS/Shunts: No atrial level shunt detected by color flow Doppler.  LEFT VENTRICLE PLAX 2D LVIDd:         6.60 cm LVIDs:         5.40 cm LV PW:         1.10 cm LV IVS:        0.90 cm LVOT diam:     2.10 cm LV SV:         37 LV SV Index:   16 LVOT Area:     3.46 cm  RIGHT VENTRICLE RV S prime:     7.89 cm/s TAPSE (M-mode): 2.0 cm LEFT ATRIUM              Index       RIGHT ATRIUM           Index LA diam:        4.20 cm  1.74 cm/m  RA Area:     17.50 cm LA Vol (A2C):   98.6 ml  40.83 ml/m RA Volume:   46.80 ml  19.38 ml/m LA Vol (A4C):   101.0 ml 41.83  ml/m LA Biplane Vol: 103.0 ml 42.66 ml/m  AORTIC VALVE LVOT Vmax:   76.50 cm/s LVOT Vmean:  54.750 cm/s LVOT VTI:    0.108 m  AORTA Ao Root diam: 3.60 cm Ao Asc diam:  3.10 cm  SHUNTS Systemic VTI:  0.11 m Systemic Diam: 2.10 cm Dani Gobble Croitoru MD Electronically signed by Sanda Klein MD Signature Date/Time: 07/20/2020/1:15:21 PM    Final     Microbiology: Recent Results (from the past 240 hour(s))  Resp Panel by RT-PCR (Flu A&B, Covid) Nasopharyngeal Swab     Status: Abnormal   Collection Time: 07/19/20  6:31 PM   Specimen: Nasopharyngeal Swab; Nasopharyngeal(NP) swabs in vial transport medium  Result Value Ref Range Status   SARS Coronavirus 2 by RT PCR POSITIVE (A) NEGATIVE Final    Comment: RESULT CALLED TO, READ BACK BY AND VERIFIED WITH: Angelita Ingles, RN 07/19/20 at 1947 sk  (NOTE) SARS-CoV-2 target nucleic acids are DETECTED.  The SARS-CoV-2 RNA is generally detectable in upper respiratory specimens during the acute phase of infection. Positive results are indicative of the presence of the identified virus, but do not rule out bacterial infection or co-infection with other pathogens not detected by the test. Clinical correlation with patient history and other diagnostic information is necessary to determine patient infection status. The expected result is Negative.  Fact Sheet for Patients: EntrepreneurPulse.com.au  Fact Sheet for Healthcare Providers: IncredibleEmployment.be  This test is not yet approved or cleared by the [place] FDA and  has been authorized for detection and/or diagnosis of SARS-CoV-2 by FDA under an Emergency Use Authorization (EUA).  This EUA will remain in effect (meaning this test can  be used) for the duration of  the COVID-19 declaration under Section 564(b)(1) of the Act, 21 U.S.C. section 360bbb-3(b)(1), unless the authorization is terminated or revoked sooner.     Influenza A by PCR NEGATIVE NEGATIVE  Final   Influenza B by PCR NEGATIVE NEGATIVE Final    Comment: (NOTE) The Xpert Xpress SARS-CoV-2/FLU/RSV plus assay is intended as an aid in the diagnosis of influenza from Nasopharyngeal swab specimens and should  not be used as a sole basis for treatment. Nasal washings and aspirates are unacceptable for Xpert Xpress SARS-CoV-2/FLU/RSV testing.  Fact Sheet for Patients: EntrepreneurPulse.com.au  Fact Sheet for Healthcare Providers: IncredibleEmployment.be  This test is not yet approved or cleared by the [place] FDA and has been authorized for detection and/or diagnosis of SARS-CoV-2 by FDA under an Emergency Use Authorization (EUA). This EUA will remain in effect (meaning this test can be used) for the duration of the COVID-19 declaration under Section 564(b)(1) of the Act, 21 U.S.C. section 360bbb-3(b)(1), unless the authorization is terminated or revoked.  Performed at [facility], [place] [address] [place], [place] [address]   Blood Culture (routine x 2)     Status: None   Collection Time: 07/19/20  6:58 PM   Specimen: BLOOD  Result Value Ref Range Status   Specimen Description BLOOD LEFT ANTECUBITAL  Final   Special Requests   Final    BOTTLES DRAWN AEROBIC AND ANAEROBIC Blood Culture adequate volume   Culture   Final    NO GROWTH 5 DAYS Performed at [facility], [place] [address] [place],  [address]    Report Status 07/24/2020 FINAL  Final     Labs: Basic Metabolic Panel: Recent Labs  Lab 07/22/20 0208 07/23/20 0349 07/24/20 0422 07/25/20 0207  NA 137 134* 135 135  K 4.3 3.7 3.6 3.9  CL 102 102 103 100  CO2 _0 GLUCOSE 244* 205* 145* 264*  BUN 27* 22* 21* 21*  CREATININE 1.11 1.06 0.97 0.99  CALCIUM 8.9 8.7* 8.8* 9.0  MG 2.0  --   --   --    Liver Function Tests: Recent Labs  Lab 07/22/20 0208 07/23/20 0349 07/24/20 0422 07/25/20 0207  AST _1 ALT _2 ALKPHOS  56 53 49 52  BILITOT 0.4 0.7 0.6 0.7  PROT 6.6 6.2* 6.4* 6.5  ALBUMIN 3.0* 3.0* 3.0* 3.0*   CBC: Recent Labs  Lab 07/22/20 0208 07/23/20 0349 07/24/20 0422 07/25/20 0207 07/26/20 1724 07/27/20 0215 07/28/20 0515  WBC 7.2 9.1 10.6* 11.8* 16.0* 15.4* 10.5  NEUTROABS 4.8 6.8 7.5 9.6*  --   --   --   HGB 13.5 13.0 13.3 14.2 14.8 14.0 14.2  HCT 40.5 40.3 41.1 42.2 46.0 41.1 42.2  MCV 77.9* 77.9* 78.3* 76.9* 78.4* 76.8* 77.7*  PLT 300 302 301 325 361 338 327    Recent Labs    07/19/20 1907  BNP 1,619.5*   CBG: Recent Labs  Lab 07/27/20 0749 07/27/20 1156 07/27/20 1648 07/27/20 2120 07/28/20 0724  GLUCAP 154* 216* 253* 249* 206*    Principal Problem:   Acute CVA (cerebrovascular accident) ([place]) Active Problems:   Diabetes ([place])   Acute combined systolic and diastolic heart failure (HCC)   COVID-19 virus infection   Sinus tachycardia   Left ventricular thrombosis   Prolonged QT interval   Time coordinating discharge: 40 minutes  Signed:  Murray Hodgkins, MD  [facility]  07/28/2020, 9:53 AM

## 2020-07-27 NOTE — Progress Notes (Signed)
PROGRESS NOTE  Rodney Kane WUJ:[id number] DOB: Apr 24, 1976 DOA: 07/19/2020 PCP: Kallie Locks, FNP  Brief History   45 year old man PMH chronic combined systolic and diastolic CHF, nonischemic cardiomyopathy, diabetes, noncompliance presented with expressive aphasia and staggering gait.  Admitted for acute CVA, sepsis secondary to COVID-19, acute on chronic CHF, acute stroke, seen by neurology and cardiology.  Responded well to treatment for Covid, which has resolved at this point.  Followed by neurology with recommendation for warfarin.  Found to have left ventricular thrombus, hospitalization prolonged by need to bridge with heparin to therapeutic warfarin. Seen by therapy w/ recommendations for PT,OT,SLP after discharge.  Stable for discharge, only barrier is finding HH that can draw INR. Cannot go to Coumadin clinic until 1/20.  Acute CVA with expressive aphasia and staggering gait on admission. --Seen by neurology --recommendation was warfarin and outpatient follow-up.  Continue statin.   --PT, OT, SLP to continue after discharge  Left ventricular thrombus --treated w/ heparin bridge to therapeutic warfarin. --now therapeutic 2 days (goal INR 2-2.5) --INR per [facility] until able to go to warfarin clinic 08/09/20  COVID-19, sepsis considered on admission, ruled out. --resolved, no hypoxia. Treated with remdesivir and steroids. --Treated with empiric antibiotics based on procalcitonin, no apparent bacterial infection noted.  Acute on chronic combined systolic, diastolic CHF, nonischemic cardiomyopathy --appears euvolemic, will continue losartan per cardiology, continue carvedilol  Essential hypertension --stable  Diabetes mellitus type 2 hemoglobin A1c 7.7 --CBG remains stable --continue Lantus, meal coverage, linagliptin, glipizide as outpatient --stop metformin  Prolonged QT  Disposition Plan:  Discussion: Patient will be going home with son.  Home health has been arranged  after great difficulty, first INR check 1/11 4 days from now.  Will observe overnight, check an INR in a.m. and discharged on Coumadin with PT/INR check in 3 days.  Status is: Inpatient  Remains inpatient appropriate because:IV treatments appropriate due to intensity of illness or inability to take PO   Dispo: The patient is from: Home              Anticipated d/c is to: Home              Anticipated d/c date is: 2 days              Patient currently is not medically stable to d/c.  DVT prophylaxis: Place and maintain sequential compression device Start: 07/20/20 0014   Code Status: Full Code Family Communication: Son by telephone  Brendia Sacks, MD  [facility] Direct contact: see [email or web address] (further directions at bottom of note if needed) 7PM-7AM contact night coverage as at bottom of note 07/27/2020, 5:35 PM  LOS: 8 days    Consults:   Cardiology  neurology   Interval History/Subjective  CC: f/u stroke  Feels fine, no complaints  Objective   Vitals:  Vitals:   07/27/20 0400 07/27/20 0800  BP: 126/85   Pulse: 77   Resp: 19 20  Temp: 98 F (36.7 C)   SpO2: 97% 96%    Exam:  Constitutional:    Appears calm and comfortable ENMT:   grossly normal hearing  Respiratory:   CTA bilaterally, no w/r/r.   Respiratory effort normal. Cardiovascular:   RRR, no m/r/g  No LE extremity edema   Psychiatric:   Mental status o Mood, affect appropriate  I have personally reviewed the following:   Today's Data   CBG labile  INR w/o change, 2.6  Scheduled Meds:  vitamin C  500 mg Oral  Daily   atorvastatin  80 mg Oral Daily   carvedilol  12.5 mg Oral BID WC   cholecalciferol  1,000 Units Oral Daily   glipiZIDE  10 mg Oral BID AC   insulin aspart  0-5 Units Subcutaneous QHS   insulin aspart  0-9 Units Subcutaneous TID WC   insulin aspart  3 Units Subcutaneous TID WC   insulin glargine  5 Units Subcutaneous Daily   linagliptin  5 mg Oral  Daily   losartan  25 mg Oral BID   pantoprazole  40 mg Oral Daily   predniSONE  40 mg Oral Q breakfast   Warfarin - Pharmacist Dosing Inpatient   Does not apply q1600   zinc sulfate  220 mg Oral Daily   Continuous Infusions:   Principal Problem:   Acute CVA (cerebrovascular accident) (HCC) Active Problems:   Diabetes (HCC)   Acute combined systolic and diastolic heart failure (HCC)   COVID-19 virus infection   Sinus tachycardia   Left ventricular thrombosis   Prolonged QT interval   LOS: 8 days   How to contact the [facility] Attending or Consulting provider 7A - 7P or covering provider during after hours 7P -7A, for this patient?  1. Check the care team in [facility] and look for a) attending/consulting TRH provider listed and b) the [facility] team listed 2. Log into [email or web address] and use [place]'s universal password to access. If you do not have the password, please contact the hospital operator. 3. Locate the [facility] provider you are looking for under [facility] and page to a number that you can be directly reached. 4. If you still have difficulty reaching the provider, please page the [facility] (Director on Call) for the Hospitalists listed on amion for assistance.

## 2020-07-27 NOTE — TOC Transition Note (Signed)
Transition of Care [facility]) - CM/SW Discharge Note   Patient Details  Name: Rodney Kane MRN: [id number] Date of Birth: 09-16-1975  Transition of Care [facility]) CM/SW Contact:  Lockie Pares, RN Phone Number: 07/27/2020, 10:46 AM   Clinical Narrative:     [facility] for home health, cannot do it, reached out to [facility] for [facility] coverage.         Patient Goals and CMS Choice        Discharge Placement                       Discharge Plan and Services                                     Social Determinants of Health (SDOH) Interventions     Readmission Risk Interventions No flowsheet data found.

## 2020-07-27 NOTE — Progress Notes (Signed)
    Message sent to [facility] for assistance with follow up. Awaiting response.   Georgie Chard NP-C [facility] Pager: [phone]

## 2020-07-28 LAB — CBC
HCT: 42.2 % (ref 39.0–52.0)
Hemoglobin: 14.2 g/dL (ref 13.0–17.0)
MCH: 26.2 pg (ref 26.0–34.0)
MCHC: 33.6 g/dL (ref 30.0–36.0)
MCV: 77.7 fL — ABNORMAL LOW (ref 80.0–100.0)
Platelets: 327 10*3/uL (ref 150–400)
RBC: 5.43 MIL/uL (ref 4.22–5.81)
RDW: 14 % (ref 11.5–15.5)
WBC: 10.5 10*3/uL (ref 4.0–10.5)
nRBC: 0 % (ref 0.0–0.2)

## 2020-07-28 LAB — PROTIME-INR
INR: 2.2 — ABNORMAL HIGH (ref 0.8–1.2)
Prothrombin Time: 23.5 seconds — ABNORMAL HIGH (ref 11.4–15.2)

## 2020-07-28 LAB — GLUCOSE, CAPILLARY: Glucose-Capillary: 206 mg/dL — ABNORMAL HIGH (ref 70–99)

## 2020-07-28 MED ORDER — LOSARTAN POTASSIUM 25 MG PO TABS: 25.0000 mg | ORAL_TABLET | Freq: Two times a day (BID) | ORAL | 2 refills | Status: DC

## 2020-07-28 MED ORDER — INSULIN GLARGINE 100 UNITS/ML SOLOSTAR PEN: 10.0000 [IU] | PEN_INJECTOR | Freq: Every day | SUBCUTANEOUS | Status: DC

## 2020-07-28 MED ORDER — ASCORBIC ACID 500 MG PO TABS: 500.0000 mg | ORAL_TABLET | Freq: Every day | ORAL | Status: DC

## 2020-07-28 MED ORDER — WARFARIN SODIUM 5 MG PO TABS: 5.0000 mg | ORAL_TABLET | Freq: Once | ORAL | Status: DC

## 2020-07-28 MED ORDER — ZINC SULFATE 220 (50 ZN) MG PO CAPS: 220.0000 mg | ORAL_CAPSULE | Freq: Every day | ORAL | Status: DC

## 2020-07-28 MED ORDER — WARFARIN SODIUM 5 MG PO TABS: 5.0000 mg | ORAL_TABLET | Freq: Every day | ORAL | 1 refills | Status: DC

## 2020-07-28 MED ORDER — INSULIN ASPART 100 UNIT/ML ~~LOC~~ SOLN: 5.0000 [IU] | Freq: Three times a day (TID) | SUBCUTANEOUS | Status: DC

## 2020-07-28 MED ORDER — LINAGLIPTIN 5 MG PO TABS: 5.0000 mg | ORAL_TABLET | Freq: Every day | ORAL | 2 refills | Status: DC

## 2020-07-28 NOTE — Plan of Care (Signed)
  Problem: Clinical Measurements: Goal: Will remain free from infection Outcome: Progressing Goal: Diagnostic test results will improve Outcome: Progressing   Problem: Safety: Goal: Ability to remain free from injury will improve Outcome: Progressing

## 2020-07-28 NOTE — Progress Notes (Signed)
ANTICOAGULATION CONSULT NOTE  Pharmacy Consult for Heparin and Warfarin Indication: LV thrombus with recent CVA  No Known Allergies  Patient Measurements: Height: 6\' 1"  (185.4 cm) Weight: 120.9 kg (266 lb 8.6 oz) IBW/kg (Calculated) : 79.9 Heparin Dosing Weight: 105.6 kg  Vital Signs: Temp: 98.5 F (36.9 C) (01/08 0456) Temp Source: Oral (01/08 0456) BP: 132/89 (01/08 0456) Pulse Rate: 84 (01/08 0456)  Labs: Recent Labs    07/26/20 0155 07/26/20 1624 07/26/20 1724 07/26/20 1724 07/27/20 0215 07/28/20 0515  HGB  --   --  14.8   < > 14.0 14.2  HCT  --   --  46.0  --  41.1 42.2  PLT  --   --  361  --  338 327  LABPROT 26.6*  --   --   --  27.2* 23.5*  INR 2.6*  --   --   --  2.6* 2.2*  HEPARINUNFRC 0.65 0.73*  --   --  0.54  --    < > = values in this interval not displayed.    Estimated Creatinine Clearance: 128.3 mL/min (by C-G formula based on SCr of 0.99 mg/dL).   Assessment: 45 yr old male admitted for a CVA. Patient was also found to have a LV thrombus on evaluation and was on no prior anticoagulation.    INR 2.2 (therapeutic) today  CBC WNL Hgb 14.2, Plt 327  Goal of Therapy:  Monitor platelets by anticoagulation protocol: Yes  INR goal 2-2.5, per neuro   Plan:  Warfarin 5 mg x 1 Daily INR, CBC   54, PharmD PGY1 Pharmacy Resident 07/28/2020 7:40 AM  Please check AMION for all [facility] Pharmacy numbers

## 2020-07-28 NOTE — Plan of Care (Signed)
Discussed d/c instructions with patient.  He acknowledges and says "yes" that he understands including:  Signs of Stroke, recovering from Covid, medications and follow up medical appointments.  He is dressed an awaiting his son to take him home.

## 2020-07-31 ENCOUNTER — Other Ambulatory Visit: Payer: Self-pay | Admitting: *Deleted

## 2020-07-31 NOTE — Patient Outreach (Signed)
[facility]) Care Management  07/31/2020  Rodney Kane 1976-03-17 [id number]    RED ON EMMI ALERT - Stroke Day # 1 Date: 07/30/2020 Red Alert Reason: - Feeling worse overall and New problems walking/talking/seeing/speaking   Outreach attempt #1, unsuccessful, unable to leave voice message.     Plan: RN CM will send unsuccessful outreach letter and follow up within the next 3-4 business days.  [name], [place], MSN [facility] Care Management  [facility] Manager [phone]

## 2020-08-03 ENCOUNTER — Telehealth: Payer: Self-pay | Admitting: Pharmacist Clinician (PhC)/ Clinical Pharmacy Specialist

## 2020-08-03 ENCOUNTER — Other Ambulatory Visit: Payer: Self-pay | Admitting: *Deleted

## 2020-08-03 NOTE — Patient Outreach (Signed)
[facility]) Care Management  08/03/2020  Koltin Wehmeyer [birth year] [id number]   RED ON EMMI ALERT - Stroke Day # 1 Date: 07/30/2020 Red Alert Reason: - Feeling worse overall and New problems walking/talking/seeing/speaking   Outreach attempt #2, unsuccessful, unable to leave voice message.     Plan: RN CM will follow up within the next 3-4 business days.   [name], [place], MSN [facility] Care Management  [facility] Manager [phone]

## 2020-08-03 NOTE — Telephone Encounter (Signed)
Spoke with Elnita Maxwell at [facility].  Patient was d/c from [facility] on 1/8 after acute CVA and LVT, on warfarin 5 mg daily.  Hospital d/c asked for first INR to be drawn Tuesday 1-11 by [facility], as patient was positive for COVID and unable to come into office.  INR has not been drawn.  HH RN reports that they were not able to get insurance approval for home visits until Wednesday.  They have reached out to patient daily but has not responded.  Called patient number, daughter number and mother number.  No response on daily basis from any number.  They will continue trying and if are able to see patient will call INR to main number on call provider over the weekend

## 2020-08-06 ENCOUNTER — Other Ambulatory Visit: Payer: Self-pay | Admitting: *Deleted

## 2020-08-06 NOTE — Patient Outreach (Signed)
[facility]) Care Management  08/06/2020  Kmarion Rawl [birth year] [id number]    RED ON EMMI ALERT- Stroke Day #1 Date:07/30/2020 Red Alert Reason:- Feeling worse overall and New problems walking/talking/seeing/speaking   RED ON EMMI ALERT - Stroke Day # 6 Date: 08/04/2020 Red Alert Reason: Feeling worse overall   Outreach attempt #3, unsuccessful, unable to leave voice message.   Plan: RN CM will follow up within the next 3-4 business days.  [name], [place], MSN [facility] Care Management  [facility] Manager [phone]

## 2020-08-09 ENCOUNTER — Other Ambulatory Visit: Payer: Self-pay | Admitting: *Deleted

## 2020-08-09 DIAGNOSIS — I639 Cerebral infarction, unspecified: Secondary | ICD-10-CM

## 2020-08-09 NOTE — Patient Outreach (Signed)
[facility]) Care Management  [place]  08/12/2020   Rodney Kane 1975-08-27 [id number]   RED ON EMMI ALERT- Stroke Day #1 Date:07/30/2020 Red Alert Reason:- Feeling worse overall and New problems walking/talking/seeing/speaking   RED ON EMMI ALERT - Stroke Day # 6 Date: 08/04/2020 Red Alert Reason: Feeling worse overall  Outreach attempt #4, successful.  Identity verified.  This care manager introduced self and stated purpose of call.  [facility] care management services explained.     Social: Member report that he has a home in [place] but living with his son in [place] since discharge ([address]).  He is independent in all ADL's, does have deficits in speech.  Able to understand, having trouble expressing himself.  Noted per chart that he had home health ordered through [facility] but wasn't started.  Call placed to agency, they report that services weren't started due to member having a copay.  He has a deductible because they are out of network.  Call was also placed to Neuro office to request order be placed to company that is within [facility] network, awaiting call back.  Conditions: Per chart, has history of HTN, CHF, DM (A1C 7.7 on 12/31), CKD, and Covid 19.  He does not monitor blood pressure or blood sugars, state he does not have any equipment to do so.  Will request glucose monitor from PCP, will send BP monitor from [facility].  Medications: Attempted to review with member, state he is only taking Warfarin and Losartan currently.  Report does not have other medications, some are too costly.  Agrees to work with pharmacy team for reconciliation.   Appointments: Had follow up with cardiology on 1/21, does not have transportation.  Conference call placed to office to reschedule for next week, referral placed to Care Guide to aide in transportation.  Also has appointments with neurology on 3/23, will help member schedule visit with PCP within  the next 2 weeks.   Encounter Medications:  Outpatient Encounter Medications as of 08/09/2020  Medication Sig  . losartan (COZAAR) 25 MG tablet Take 1 tablet (25 mg total) by mouth in the morning and at bedtime.  Marland Kitchen warfarin (COUMADIN) 5 MG tablet Take 1 tablet (5 mg total) by mouth daily at 4 PM.  . ascorbic acid (VITAMIN C) 500 MG tablet Take 1 tablet (500 mg total) by mouth daily.  Marland Kitchen atorvastatin (LIPITOR) 10 MG tablet Take 1 tablet (10 mg total) by mouth daily.  . blood glucose meter kit and supplies Dispense based on patient and insurance preference. Use up to four times daily for blood glucose readings greater than 125.  . carvedilol (COREG) 12.5 MG tablet Take 1 tablet (12.5 mg total) by mouth 2 (two) times daily with a meal.  . glipiZIDE (GLUCOTROL) 10 MG tablet Take 1 tablet (10 mg total) by mouth 2 (two) times daily before a meal.  . insulin aspart (NOVOLOG) 100 UNIT/ML injection Inject 5 Units into the skin 3 (three) times daily with meals.  . insulin glargine (LANTUS) 100 unit/mL SOPN Inject 10 Units into the skin at bedtime.  Marland Kitchen linagliptin (TRADJENTA) 5 MG TABS tablet Take 1 tablet (5 mg total) by mouth daily.  . potassium chloride SA (K-DUR,KLOR-CON) 20 MEQ tablet Take 1 tablet (20 mEq total) by mouth daily.  . Vitamin D, Ergocalciferol, (DRISDOL) 1.25 MG (50000 UNIT) CAPS capsule Take 1 capsule (50,000 Units total) by mouth every 7 (seven) days. (Patient taking differently: Take 50,000 Units by mouth  every Saturday.)  . zinc sulfate 220 (50 Zn) MG capsule Take 1 capsule (220 mg total) by mouth daily.   No facility-administered encounter medications on file as of 08/09/2020.    Functional Status:  In your present state of health, do you have any difficulty performing the following activities: 07/23/2020 07/21/2020  Hearing? N N  Vision? N N  Difficulty concentrating or making decisions? Tempie Donning  Walking or climbing stairs? N N  Dressing or bathing? N N  Doing errands, shopping? N -   Some recent data might be hidden    Fall/Depression Screening: Fall Risk  06/29/2018 01/29/2017  Falls in the past year? 0 No  Number falls in past yr: 0 -  Injury with Fall? 0 -   PHQ 2/9 Scores 08/09/2020 01/04/2020 06/29/2018 01/29/2017  PHQ - 2 Score 1 0 0 0  Exception Documentation (No Data) - - -    Assessment:  Goals Addressed            This Visit's Progress   . [facility] - Find Help in My Community       Timeframe:  Short-Term Goal Priority:  High Start Date:       08/09/2020                      Expected End Date:       09/09/2020                Follow Up Date 1/25   - follow-up on any referrals for help I am given - think ahead to make sure my need does not become an emergency    Why is this important?    Knowing how and where to find help for yourself or family in your neighborhood and community is an important skill.   You will want to take some steps to learn how.    Notes:   1/20 - Referral placed to Remote Health    . [facility] - Make and Keep All Appointments       Timeframe:  Short-Term Goal Priority:  Medium Start Date:          08/09/2020                   Expected End Date:       09/09/2020                Follow Up Date 1/25    - ask family or friend for a ride - call to cancel if needed - keep a calendar with appointment dates    Why is this important?    Part of staying healthy is seeing the doctor for follow-up care.   If you forget your appointments, there are some things you can do to stay on track.    Notes:     . [facility] - Track and Manage My Blood Pressure-Hypertension       Timeframe:  Long-Range Goal Priority:  Medium Start Date:         08/09/2020                    Expected End Date:  10/07/2020                     Follow Up Date 1/25   - check blood pressure daily - write blood pressure results in a log or diary    Why is this important?  You won't feel high blood pressure, but it can still hurt your blood vessels.   High blood  pressure can cause heart or kidney problems. It can also cause a stroke.   Making lifestyle changes like losing a little weight or eating less salt will help.   Checking your blood pressure at home and at different times of the day can help to control blood pressure.   If the doctor prescribes medicine remember to take it the way the doctor ordered.   Call the office if you cannot afford the medicine or if there are questions about it.     Notes:        Plan:  Follow-up:  Patient agrees to Care Plan and Follow-up. Will notify PCP of [facility] involvement.  Will send member education on stroke and BP management.  Will place referral to pharmacy team and Remote Health.  Will call provider office to request home heath for speech therapy.  Will place referral to care guide for transportation assistance.   Valente David, [place], MSN [place] [phone]

## 2020-08-10 ENCOUNTER — Ambulatory Visit: Payer: BC Managed Care – PPO | Admitting: Medical

## 2020-08-10 ENCOUNTER — Telehealth: Payer: Self-pay

## 2020-08-10 ENCOUNTER — Telehealth: Payer: Self-pay | Admitting: Family Medicine

## 2020-08-10 NOTE — Telephone Encounter (Signed)
   Richmond Coldren DOB: Feb 24, 1976 MRN: [id number]   RIDER WAIVER AND RELEASE OF LIABILITY  For purposes of improving physical access to our facilities, [facility] is pleased to partner with third parties to provide [place] patients or other authorized individuals the option of convenient, on-demand ground transportation services (the [employer]") through use of the technology service that enables users to request on-demand ground transportation from independent third-party providers.  By opting to use and accept these [employer], I, the undersigned, hereby agree on behalf of myself, and on behalf of any minor child using the [employer] for whom I am the parent or legal guardian, as follows:  1. Science writer provided to me are provided by independent third-party transportation providers who are not [employer] or employees and who are unaffiliated with [employer]. 2. [facility] is neither a transportation carrier nor a common or public carrier. 3. [facility] has no control over the quality or safety of the transportation that occurs as a result of the [employer]. 4. [facility] cannot guarantee that any third-party transportation provider will complete any arranged transportation service. 5. [facility] makes no representation, warranty, or guarantee regarding the reliability, timeliness, quality, safety, suitability, or availability of any of the Transport Services or that they will be error free. 6. I fully understand that traveling by vehicle involves risks and dangers of serious bodily injury, including permanent disability, paralysis, and death. I agree, on behalf of myself and on behalf of any minor child using the Transport Services for whom I am the parent or legal guardian, that the entire risk arising out of my use of the [employer] remains solely with me, to the maximum extent permitted under applicable law. 7. The [employer] are provided "as is" and "as available." [place] [facility] disclaims all representations and warranties, express, implied or statutory, not expressly set out in these terms, including the implied warranties of merchantability and fitness for a particular purpose. 8. I hereby waive and release [place], its agents, employees, officers, directors, representatives, insurers, attorneys, assigns, successors, subsidiaries, and affiliates from any and all past, present, or future claims, demands, liabilities, actions, causes of action, or suits of any kind directly or indirectly arising from acceptance and use of the [employer]. 9. I further waive and release [place] and its affiliates from all present and future liability and responsibility for any injury or death to persons or damages to property caused by or related to the use of the [employer]. 10. I have read this Waiver and Release of Liability, and I understand the terms used in it and their legal significance. This Waiver is freely and voluntarily given with the understanding that my right (as well as the right of any minor child for whom I am the parent or legal guardian using the [employer]) to legal recourse against [facility] in connection with the [employer] is knowingly surrendered in return for use of these services.   I attest that I read the consent document to Lenon Curt, gave Mr. Dismuke the opportunity to ask questions and answered the questions asked (if any). I affirm that Lenon Curt then provided consent for he's participation in this program.     Launa Grill

## 2020-08-10 NOTE — Telephone Encounter (Signed)
° °  Telephone encounter was:  Successful.  08/10/2020 Name: Rodney Kane MRN: [id number] DOB: Jan 09, 1976  Rodney Kane is a 45 y.o. year old male who is a primary care patient of Kallie Locks, FNP . The community resource team was consulted for assistance with Transportation Needs   Care guide performed the following interventions: Patient provided with information about care guide support team and interviewed to confirm resource needs Placed referral to [employer] via email and phone. [facility] spoke with Dena Billet at [facility] and emailed request, spoke with patient to let him know to expect a call from transportation. Received emailed confirmation from transportation. They will call the patient and complete his enrollement..  Follow Up Plan:  I will contact patient again today to ensure [facility] [employer] has called the patient to confirm request.  Jolynn Bajorek, AAS Paralegal, [facility] Care Guide  Embedded Care Coordination [facility]   Care Management  [address] [place], [place] [address] ??[email or web address]   ?? [phone]   [email or web address]

## 2020-08-10 NOTE — Telephone Encounter (Signed)
° °  Telephone encounter was:  Successful.  08/10/2020 Name: Rodney Kane MRN: [id number] DOB: 03-02-1976  Rodney Kane is a 45 y.o. year old male who is a primary care patient of Kallie Locks, FNP . The community resource team was consulted for assistance with Transportation Needs   Care guide performed the following interventions: Received email confirmation from [employer], [employer]. Patient has been contacted and enrolled, ride has been sent to the dispatcher who will call the patient later today to let hi know of pick up. Patient is aware that he will be called later. .  Follow Up Plan:  No further follow up planned at this time. The patient has been provided with needed resources.  Ihsan Nomura, AAS Paralegal, [facility] Care Guide  Embedded Care Coordination [place]   Care Management  [address] [place], [place] [address] ??[email or web address]   ?? [phone]   [email or web address]

## 2020-08-10 NOTE — Patient Outreach (Signed)
[facility]) Care Management  08/10/2020  Derek Laughter 19-Jun-1976 [id number]   Referral for medication assistance for [name], RN sent to [facility].  Baruch Gouty [facility] Management Assistant [phone]

## 2020-08-12 ENCOUNTER — Encounter: Payer: Self-pay | Admitting: *Deleted

## 2020-08-13 ENCOUNTER — Ambulatory Visit: Payer: BC Managed Care – PPO | Admitting: Physician Assistant

## 2020-08-13 ENCOUNTER — Other Ambulatory Visit: Payer: Self-pay

## 2020-08-13 ENCOUNTER — Other Ambulatory Visit: Payer: Self-pay | Admitting: *Deleted

## 2020-08-13 ENCOUNTER — Ambulatory Visit (INDEPENDENT_AMBULATORY_CARE_PROVIDER_SITE_OTHER): Payer: BC Managed Care – PPO

## 2020-08-13 DIAGNOSIS — I513 Intracardiac thrombosis, not elsewhere classified: Secondary | ICD-10-CM | POA: Diagnosis not present

## 2020-08-13 DIAGNOSIS — Z5181 Encounter for therapeutic drug level monitoring: Secondary | ICD-10-CM

## 2020-08-13 DIAGNOSIS — Z7901 Long term (current) use of anticoagulants: Secondary | ICD-10-CM | POA: Insufficient documentation

## 2020-08-13 DIAGNOSIS — I639 Cerebral infarction, unspecified: Secondary | ICD-10-CM

## 2020-08-13 DIAGNOSIS — I4891 Unspecified atrial fibrillation: Secondary | ICD-10-CM | POA: Insufficient documentation

## 2020-08-13 DIAGNOSIS — I480 Paroxysmal atrial fibrillation: Secondary | ICD-10-CM | POA: Insufficient documentation

## 2020-08-13 DIAGNOSIS — I824Y9 Acute embolism and thrombosis of unspecified deep veins of unspecified proximal lower extremity: Secondary | ICD-10-CM | POA: Insufficient documentation

## 2020-08-13 LAB — POCT INR: INR: 1.4 — AB (ref 2.0–3.0)

## 2020-08-13 NOTE — Patient Outreach (Addendum)
[facility]) Care Management  08/13/2020  Rodney Kane [birth year] [id number]   Call placed to member to complete initial assessment, no answer, unable to leave message.  Will follow up within the next 3-4 business days.    Incoming call received from [facility], member does not qualify for program as he is not 64 or order.  Collaboration with [facility] pharmacist, noted that member does not qualify for Extra Help but may benefit from coupons.  She will contact PCP directly for medication management.    Outgoing call placed to PCP, follow up appointment made for 2/1 at 1120.  Notified that member will need outpatient speech therapy referral as no agency has been able to accept his insurance.  Note made to have MD address during visit.  [name], [place], MSN [facility] Care Management  [facility] Manager [phone]

## 2020-08-13 NOTE — Patient Instructions (Addendum)
Take 1 tablet Daily.  Missed 2 days last week.  INR in 1 week  A full discussion of the nature of anticoagulants has been carried out.  A benefit risk analysis has been presented to the patient, so that they understand the justification for choosing anticoagulation at this time. The need for frequent and regular monitoring, precise dosage adjustment and compliance is stressed.  Side effects of potential bleeding are discussed.  The patient should avoid any OTC items containing aspirin or ibuprofen, and should avoid great swings in general diet.  Avoid alcohol consumption.  Call if any signs of abnormal bleeding.  [phone]

## 2020-08-13 NOTE — Patient Instructions (Signed)
Blood Pressure Record Sheet To take your blood pressure, you will need a blood pressure machine. You can buy a blood pressure machine (blood pressure monitor) at your clinic, drug store, or online. When choosing one, consider:  An automatic monitor that has an arm cuff.  A cuff that wraps snugly around your upper arm. You should be able to fit only one finger between your arm and the cuff.  A device that stores blood pressure reading results.  Do not choose a monitor that measures your blood pressure from your wrist or finger. Follow your health care provider's instructions for how to take your blood pressure. To use this form:  Get one reading in the morning (a.m.) before you take any medicines.  Get one reading in the evening (p.m.) before supper.  Take at least 2 readings with each blood pressure check. This makes sure the results are correct. Wait 1-2 minutes between measurements.  Write down the results in the spaces on this form.  Repeat this once a week, or as told by your health care provider.  Make a follow-up appointment with your health care provider to discuss the results. Blood pressure log Date: _______________________  a.m. _____________________(1st reading) _____________________(2nd reading)  p.m. _____________________(1st reading) _____________________(2nd reading) Date: _______________________  a.m. _____________________(1st reading) _____________________(2nd reading)  p.m. _____________________(1st reading) _____________________(2nd reading) Date: _______________________  a.m. _____________________(1st reading) _____________________(2nd reading)  p.m. _____________________(1st reading) _____________________(2nd reading) Date: _______________________  a.m. _____________________(1st reading) _____________________(2nd reading)  p.m. _____________________(1st reading) _____________________(2nd reading) Date: _______________________  a.m.  _____________________(1st reading) _____________________(2nd reading)  p.m. _____________________(1st reading) _____________________(2nd reading) This information is not intended to replace advice given to you by your health care provider. Make sure you discuss any questions you have with your health care provider. Document Revised: 10/26/2019 Document Reviewed: 10/26/2019 Elsevier Patient Education  2021 Elsevier Inc. Stroke Prevention Some medical conditions and lifestyle choices can lead to a higher risk for a stroke. You can help to prevent a stroke by eating healthy foods and exercising. It also helps to not smoke and to manage any health problems you may have. How can this condition affect me? A stroke is an emergency. It should be treated right away. A stroke can lead to brain damage or threaten your life. There is a better chance of surviving and getting better after a stroke if you get medical help right away. What can increase my risk? The following medical conditions may increase your risk of a stroke:  Diseases of the heart and blood vessels (cardiovascular disease).  High blood pressure (hypertension).  Diabetes.  High cholesterol.  Sickle cell disease.  Problems with blood clotting.  Being very overweight.  Sleeping problems (obstructivesleep apnea). Other risk factors include:  Being older than age 31.  A history of blood clots, stroke, or mini-stroke (TIA).  Race, ethnic background, or a family history of stroke.  Smoking or using tobacco products.  Taking birth control pills, especially if you smoke.  Heavy alcohol and drug use.  Not being active. What actions can I take to prevent this? Manage your health conditions  High cholesterol. ? Eat a healthy diet. If this is not enough to manage your cholesterol, you may need to take medicines. ? Take medicines as told by your doctor.  High blood pressure. ? Try to keep your blood pressure below  130/80. ? If your blood pressure cannot be managed through a healthy diet and regular exercise, you may need to take medicines. ? Take medicines as told by  your doctor. ? Ask your doctor if you should check your blood pressure at home. ? Have your blood pressure checked every year.  Diabetes. ? Eat a healthy diet and get regular exercise. If your blood sugar (glucose) cannot be managed through diet and exercise, you may need to take medicines. ? Take medicines as told by your doctor.  Talk to your doctor about getting checked for sleeping problems. Signs of a problem can include: ? Snoring a lot. ? Feeling very tired.  Make sure that you manage any other conditions you have. Nutrition  Follow instructions from your doctor about what to eat or drink. You may be told to: ? Eat and drink fewer calories each day. ? Limit how much salt (sodium) you use to 1,500 milligrams (mg) each day. ? Use only healthy fats for cooking, such as olive oil, canola oil, and sunflower oil. ? Eat healthy foods. To do this:  Choose foods that are high in fiber. These include whole grains, and fresh fruits and vegetables.  Eat at least 5 servings of fruits and vegetables a day. Try to fill one-half of your plate with fruits and vegetables at each meal.  Choose low-fat (lean) proteins. These include low-fat cuts of meat, chicken without skin, fish, tofu, beans, and nuts.  Eat low-fat dairy products. ? Avoid foods that:  Are high in salt.  Have saturated fat.  Have trans fat.  Have cholesterol.  Are processed or pre-made. ? Count how many carbohydrates you eat and drink each day.   Lifestyle  If you drink alcohol: ? Limit how much you have to:  0-1 drink a day for women who are not pregnant.  0-2 drinks a day for men. ? Know how much alcohol is in your drink. In the [place] one drink equals one 12 oz bottle of beer ( ), one 5 oz glass of wine ( ), or one 1 oz glass of hard liquor  (34mL).  Do not smoke or use any products that have nicotine or tobacco. If you need help quitting, ask your doctor.  Avoid secondhand smoke.  Do not use drugs. Activity  Try to stay at a healthy weight.  Get at least 30 minutes of exercise on most days, such as: ? Fast walking. ? Biking. ? Swimming.   Medicines  Take over-the-counter and prescription medicines only as told by your doctor.  Avoid taking birth control pills. Talk to your doctor about the risks of taking birth control pills if: ? You are over 81 years old. ? You smoke. ? You get very bad headaches. ? You have had a blood clot. Where to find more information  American Stroke Association: www.strokeassociation.org Get help right away if:  You or a loved one has any signs of a stroke. "BE FAST" is an easy way to remember the warning signs: ? B - Balance. Dizziness, sudden trouble walking, or loss of balance. ? E - Eyes. Trouble seeing or a change in how you see. ? F - Face. Sudden weakness or loss of feeling of the face. The face or eyelid may droop on one side. ? A - Arms. Weakness or loss of feeling in an arm. This happens all of a sudden and most often on one side of the body. ? S - Speech. Sudden trouble speaking, slurred speech, or trouble understanding what people say. ? T - Time. Time to call emergency services. Write down what time symptoms started.  You or a loved one has other  signs of a stroke, such as: ? A sudden, very bad headache with no known cause. ? Feeling like you may vomit (nausea). ? Vomiting. ? A seizure. These symptoms may be an emergency. Get help right away. Call your local emergency services (911 in the [place].S[place]  Do not wait to see if the symptoms will go away.  Do not drive yourself to the hospital. Summary  You can help to prevent a stroke by eating healthy, exercising, and not smoking. It also helps to manage any health problems you have.  Do not smoke or use any products that  contain nicotine or tobacco.  Get help right away if you or a loved one has any signs of a stroke. This information is not intended to replace advice given to you by your health care provider. Make sure you discuss any questions you have with your health care provider. Document Revised: 02/06/2020 Document Reviewed: 02/06/2020 Elsevier Patient Education  2021 Elsevier Inc.      Goals Addressed            This Visit's Progress   . [facility] - Find Help in My Community       Timeframe:  Short-Term Goal Priority:  High Start Date:       08/09/2020                      Expected End Date:       09/09/2020                Follow Up Date 1/25   - follow-up on any referrals for help I am given - think ahead to make sure my need does not become an emergency    Why is this important?    Knowing how and where to find help for yourself or family in your neighborhood and community is an important skill.   You will want to take some steps to learn how.    Notes:   1/20 - Referral placed to [facility]    . [facility] - Make and Keep All Appointments       Timeframe:  Short-Term Goal Priority:  Medium Start Date:          08/09/2020                   Expected End Date:       09/09/2020                Follow Up Date 1/25    - ask family or friend for a ride - call to cancel if needed - keep a calendar with appointment dates    Why is this important?    Part of staying healthy is seeing the doctor for follow-up care.   If you forget your appointments, there are some things you can do to stay on track.    Notes:     . [facility] - Track and Manage My Blood Pressure-Hypertension       Timeframe:  Long-Range Goal Priority:  Medium Start Date:         08/09/2020                    Expected End Date:  10/07/2020                     Follow Up Date 1/25   - check blood pressure daily - write blood pressure results in a log or  diary    Why is this important?    You won't feel high blood  pressure, but it can still hurt your blood vessels.   High blood pressure can cause heart or kidney problems. It can also cause a stroke.   Making lifestyle changes like losing a little weight or eating less salt will help.   Checking your blood pressure at home and at different times of the day can help to control blood pressure.   If the doctor prescribes medicine remember to take it the way the doctor ordered.   Call the office if you cannot afford the medicine or if there are questions about it.     Notes:

## 2020-08-14 ENCOUNTER — Ambulatory Visit: Payer: Self-pay | Admitting: *Deleted

## 2020-08-16 ENCOUNTER — Ambulatory Visit: Payer: BC Managed Care – PPO | Admitting: Cardiology

## 2020-08-17 ENCOUNTER — Other Ambulatory Visit: Payer: Self-pay | Admitting: *Deleted

## 2020-08-17 ENCOUNTER — Other Ambulatory Visit: Payer: Self-pay | Admitting: Family Medicine

## 2020-08-17 DIAGNOSIS — E118 Type 2 diabetes mellitus with unspecified complications: Secondary | ICD-10-CM

## 2020-08-17 MED ORDER — LINAGLIPTIN 5 MG PO TABS: 5.0000 mg | ORAL_TABLET | Freq: Every day | ORAL | 3 refills | Status: DC

## 2020-08-17 NOTE — Patient Outreach (Signed)
[facility]) Care Management  08/17/2020  Rodney Kane 11/11/1975 [id number]   Outreach attempt #2, successful.  He report he is still recovering from stroke, physically remains able to do for self, communication issues still lingers.  He is made aware of office visit with PCP on 2/1, provider will discuss speech therapy that time.  Noted that member missed scheduled office visit with cardiologist yesterday, state he didn't realize he had one.  He has Coumadin clinic visit on 1/31, conference call placed to cardiology, office visit rescheduled for 2/16.  State he has transportation to 1/31 visit, will call to get transportation for 2/1 and 2/16 visits.    Confirms that he has been in contact with [facility] pharmacist, advised that Tradjenta was not covered under his insurance but Januvia was ordered in it's placed.  Advised to obtain from pharmacy.  Also advised to take all medications he has to PCP visit in order to reconcile and reorder what's needed.  He does not have glucose meter to monitor blood sugars, will request one to be ordered.  Does have blood pressure monitor, will start monitoring blood pressure daily and share trends with PCP.  Denies any urgent concerns, encouraged to contact this care manager with questions.  Agrees to follow up within the next 2 weeks.  Goals Addressed            This Visit's Progress   . [facility] - Find Help in My Community   On track    Timeframe:  Short-Term Goal Priority:  High Start Date:       08/09/2020                      Expected End Date:       09/09/2020                Follow Up Date 2/8   - follow-up on any referrals for help I am given - think ahead to make sure my need does not become an emergency    Why is this important?    Knowing how and where to find help for yourself or family in your neighborhood and community is an important skill.   You will want to take some steps to learn how.    Notes:   1/28 - Confirmed  member has been in contact with [facility] pharmacist  1/20 - Referral placed to [facility]    . [facility] - Make and Keep All Appointments   On track    Timeframe:  Short-Term Goal Priority:  Medium Start Date:          08/09/2020                   Expected End Date:       09/09/2020                Follow Up Date 2/8    - ask family or friend for a ride - call to cancel if needed - keep a calendar with appointment dates    Why is this important?    Part of staying healthy is seeing the doctor for follow-up care.   If you forget your appointments, there are some things you can do to stay on track.    Notes:   1/28 - Call placed to reschedule cardiology visit    . [facility] - Track and Manage My Blood Pressure-Hypertension   Not on track    Timeframe:  Long-Range Goal Priority:  Medium Start Date:         08/09/2020                    Expected End Date:  10/07/2020                     Follow Up Date 2/8   - check blood pressure daily - write blood pressure results in a log or diary    Why is this important?    You won't feel high blood pressure, but it can still hurt your blood vessels.   High blood pressure can cause heart or kidney problems. It can also cause a stroke.   Making lifestyle changes like losing a little weight or eating less salt will help.   Checking your blood pressure at home and at different times of the day can help to control blood pressure.   If the doctor prescribes medicine remember to take it the way the doctor ordered.   Call the office if you cannot afford the medicine or if there are questions about it.     Notes:   1/28 - Reminded to monitor blood pressure and record daily       [name], Charity fundraiser, MSN [facility] Care Management  [facility] Manager [phone]

## 2020-08-20 ENCOUNTER — Ambulatory Visit (INDEPENDENT_AMBULATORY_CARE_PROVIDER_SITE_OTHER): Payer: BC Managed Care – PPO

## 2020-08-20 ENCOUNTER — Other Ambulatory Visit: Payer: Self-pay

## 2020-08-20 DIAGNOSIS — I513 Intracardiac thrombosis, not elsewhere classified: Secondary | ICD-10-CM | POA: Diagnosis not present

## 2020-08-20 DIAGNOSIS — Z7901 Long term (current) use of anticoagulants: Secondary | ICD-10-CM

## 2020-08-20 DIAGNOSIS — I639 Cerebral infarction, unspecified: Secondary | ICD-10-CM | POA: Diagnosis not present

## 2020-08-20 LAB — POCT INR: INR: 2.4 (ref 2.0–3.0)

## 2020-08-20 NOTE — Patient Instructions (Signed)
Take 1 tablet Daily.  INR to be done in 1 week.

## 2020-08-21 ENCOUNTER — Encounter: Payer: Self-pay | Admitting: Family Medicine

## 2020-08-21 ENCOUNTER — Ambulatory Visit (INDEPENDENT_AMBULATORY_CARE_PROVIDER_SITE_OTHER): Payer: BC Managed Care – PPO | Admitting: Family Medicine

## 2020-08-21 VITALS — BP 151/101 | HR 126 | Temp 98.4°F | Ht 73.0 in | Wt 274.6 lb

## 2020-08-21 DIAGNOSIS — I10 Essential (primary) hypertension: Secondary | ICD-10-CM | POA: Diagnosis not present

## 2020-08-21 DIAGNOSIS — I160 Hypertensive urgency: Secondary | ICD-10-CM | POA: Diagnosis not present

## 2020-08-21 DIAGNOSIS — R7309 Other abnormal glucose: Secondary | ICD-10-CM

## 2020-08-21 DIAGNOSIS — R739 Hyperglycemia, unspecified: Secondary | ICD-10-CM

## 2020-08-21 DIAGNOSIS — Z09 Encounter for follow-up examination after completed treatment for conditions other than malignant neoplasm: Secondary | ICD-10-CM

## 2020-08-21 DIAGNOSIS — Z91148 Patient's other noncompliance with medication regimen for other reason: Secondary | ICD-10-CM

## 2020-08-21 DIAGNOSIS — Z760 Encounter for issue of repeat prescription: Secondary | ICD-10-CM

## 2020-08-21 DIAGNOSIS — Z9114 Patient's other noncompliance with medication regimen: Secondary | ICD-10-CM

## 2020-08-21 DIAGNOSIS — E118 Type 2 diabetes mellitus with unspecified complications: Secondary | ICD-10-CM

## 2020-08-21 MED ORDER — LOSARTAN POTASSIUM 25 MG PO TABS: 25.0000 mg | ORAL_TABLET | Freq: Two times a day (BID) | ORAL | 3 refills | Status: DC

## 2020-08-21 MED ORDER — CARVEDILOL 12.5 MG PO TABS: 12.5000 mg | ORAL_TABLET | Freq: Two times a day (BID) | ORAL | 3 refills | Status: DC

## 2020-08-21 NOTE — Progress Notes (Signed)
Patient [facility] Follow Up  Subjective:  Patient ID: Rodney Kane, male    DOB: 1975/12/09  Age: 45 y.o. MRN: [id number]  CC:  Chief Complaint  Patient presents with  . discuss get referral    Getting a referral to  speech therapy     HPI Rodney Kane is a 45 year old male who presents for Hospital Follow Up today.  Patient Active Problem List   Diagnosis Date Noted  . Atrial fibrillation ([place]) 08/13/2020  . Acute venous embolism and thrombosis of deep vessels of proximal lower extremity ([place]) 08/13/2020  . Long term (current) use of anticoagulants 08/13/2020  . 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 Prolonged QT interval 07/25/2020  . Sinus tachycardia 07/20/2020  . Acute CVA (cerebrovascular accident) ([place]) 07/20/2020  . COVID-19 virus infection 07/19/2020  . Noncompliance with medication regimen 01/04/2020  . Hyperglycemia 01/04/2020  . Urine ketones 01/04/2020  . Essential hypertension 01/04/2020  . NICM (nonischemic cardiomyopathy) ([place]) 01/16/2017  . 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 Acute combined systolic and diastolic heart failure ([place])   . Elevated troponin   . 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 Diabetes ([place]) 01/02/2017   Current Status: Since his last office visit, he has had a Hospital Admission on 07/19/2020- 07/28/2020 for CVA and Coronavirius. He has residual side effects include shortness of breath and difficultly with speech. His blood pressures are elevated today. He states that he recently ate sausage which he believes increased his blood pressure. He has follow up with Cardiologist on 08/27/2020.Marland Kitchen He denies visual changes, chest pain, cough, shortness of breath, heart palpitations, and falls. He has occasional headaches and dizziness with position changes. Denies severe headaches, confusion, seizures, double vision, and blurred vision, nausea and  vomiting.   He has not been monitoring his blood glucose levels lately.  He denies fatigue, frequent urination, blurred vision, excessive hunger, excessive thirst, weight gain, weight loss, and poor wound healing. He continues to check his feet regularly. He denies fevers, chills, fatigue, recent infections, weight loss, and night sweats. Denies GI problems such as diarrhea, and constipation. He has no reports of blood in stools, dysuria and hematuria. No depression or anxiety reported today. He is taking all medications as prescribed. He denies pain today.   Past Medical History:  Diagnosis Date  . Coronavirus infection 06/2020  . Diabetes mellitus without complication ([place])   . Hemoglobin A1C greater than 9%, indicating poor diabetic control 12/2019  . Hyperglycemia 12/2019  . Hyperlipidemia 12/2019  . Hypertension   . Hypertensive urgency 12/2019  . Noncompliance with medication regimen 12/2019  . Stroke ([place])   . Urine ketones 12/2019  . Vitamin D deficiency 12/2019    Past Surgical History:  Procedure Laterality Date  . left knee surgery    . RIGHT/LEFT HEART CATH AND CORONARY ANGIOGRAPHY N/A 01/05/2017   Procedure: Right/Left Heart Cath and Coronary Angiography;  Surgeon: Leonie Man, MD;  Location: [facility];  Service: Cardiovascular;  Laterality: N/A;    Family History  Problem Relation Age of Onset  . Multiple sclerosis Mother   . Diabetic kidney disease Mother   . Hypertension Mother     Social History   Socioeconomic History  . Marital status: Divorced    Spouse name: Not on file  . Number of children: 3  . Years of education: Not on file  . Highest education level: Not  on file  Occupational History  . Not on file  Tobacco Use  . Smoking status: Never Smoker  . Smokeless tobacco: Never Used  Vaping Use  . Vaping Use: Never used  Substance and Sexual Activity  . Alcohol use: No  . Drug use: No  . Sexual activity: Yes  Other Topics Concern   . Not on file  Social History Narrative  . Not on file   Social Determinants of Health   Financial Resource Strain: Not on file  Food Insecurity: No Food Insecurity  . Worried About Charity fundraiser in the Last Year: Never true  . Ran Out of Food in the Last Year: Never true  Transportation Needs: Unmet Transportation Needs  . Lack of Transportation (Medical): Yes  . Lack of Transportation (Non-Medical): Yes  Physical Activity: Not on file  Stress: Not on file  Social Connections: Not on file  Intimate Partner Violence: Not on file    Outpatient Medications Prior to Visit  Medication Sig Dispense Refill  . ascorbic acid (VITAMIN C) 500 MG tablet Take 1 tablet (500 mg total) by mouth daily.    Marland Kitchen atorvastatin (LIPITOR) 10 MG tablet Take 1 tablet (10 mg total) by mouth daily. 90 tablet 3  . blood glucose meter kit and supplies Dispense based on patient and insurance preference. Use up to four times daily for blood glucose readings greater than 125. 1 each 0  . glipiZIDE (GLUCOTROL) 10 MG tablet Take 1 tablet (10 mg total) by mouth 2 (two) times daily before a meal. 180 tablet 3  . insulin aspart (NOVOLOG) 100 UNIT/ML injection Inject 5 Units into the skin 3 (three) times daily with meals.    . insulin glargine (LANTUS) 100 unit/mL SOPN Inject 10 Units into the skin at bedtime.    Marland Kitchen linagliptin (TRADJENTA) 5 MG TABS tablet Take 1 tablet (5 mg total) by mouth daily. 90 tablet 3  . potassium chloride SA (K-DUR,KLOR-CON) 20 MEQ tablet Take 1 tablet (20 mEq total) by mouth daily. 30 tablet 3  . Vitamin D, Ergocalciferol, (DRISDOL) 1.25 MG (50000 UNIT) CAPS capsule Take 1 capsule (50,000 Units total) by mouth every 7 (seven) days. (Patient taking differently: Take 50,000 Units by mouth every Saturday.) 5 capsule 5  . warfarin (COUMADIN) 5 MG tablet Take 1 tablet (5 mg total) by mouth daily at 4 PM. 30 tablet 1  . zinc sulfate 220 (50 Zn) MG capsule Take 1 capsule (220 mg total) by mouth  daily.    . carvedilol (COREG) 12.5 MG tablet Take 1 tablet (12.5 mg total) by mouth 2 (two) times daily with a meal. 180 tablet 3  . losartan (COZAAR) 25 MG tablet Take 1 tablet (25 mg total) by mouth in the morning and at bedtime. 60 tablet 2   No facility-administered medications prior to visit.    No Known Allergies  ROS Review of Systems  Constitutional: Negative.   HENT: Negative.   Eyes: Negative.   Respiratory: Negative.   Cardiovascular: Negative.   Gastrointestinal: Positive for abdominal distention (obese).  Endocrine: Negative.   Genitourinary: Negative.   Musculoskeletal: Positive for arthralgias (generalized joint pain).  Skin: Negative.   Allergic/Immunologic: Negative.   Neurological: Positive for dizziness (occasional ) and headaches (occasional ).  Hematological: Negative.   Psychiatric/Behavioral: Negative.       Objective:    Physical Exam Vitals and nursing note reviewed.  Constitutional:      Appearance: Normal appearance.  HENT:  Head: Normocephalic and atraumatic.     Nose: Nose normal.     Mouth/Throat:     Mouth: Mucous membranes are moist.     Pharynx: Oropharynx is clear.  Cardiovascular:     Rate and Rhythm: Normal rate and regular rhythm.     Pulses: Normal pulses.     Heart sounds: Normal heart sounds.  Pulmonary:     Effort: Pulmonary effort is normal.     Breath sounds: Normal breath sounds.  Abdominal:     General: Bowel sounds are normal. There is distension (obese).     Palpations: Abdomen is soft.  Musculoskeletal:        General: Normal range of motion.  Skin:    General: Skin is warm and dry.  Neurological:     General: No focal deficit present.     Mental Status: He is alert and oriented to person, place, and time.  Psychiatric:        Mood and Affect: Mood normal.        Behavior: Behavior normal.        Thought Content: Thought content normal.        Judgment: Judgment normal.     BP (!) 151/101   Pulse (!)  126   Temp 98.4 F (36.9 C) (Temporal)   Ht 6' 1"  (1.854 m)   Wt 274 lb 9.6 oz (124.6 kg)   SpO2 98%   BMI 36.23 kg/m  Wt Readings from Last 3 Encounters:  08/21/20 274 lb 9.6 oz (124.6 kg)  07/28/20 266 lb 8.6 oz (120.9 kg)  07/16/20 262 lb 5.6 oz (119 kg)     Health Maintenance Due  Topic Date Due  . Hepatitis C Screening  Never done  . PNEUMOCOCCAL POLYSACCHARIDE VACCINE AGE 64-64 HIGH RISK  Never done  . COVID-19 Vaccine (1) Never done  . OPHTHALMOLOGY EXAM  Never done  . FOOT EXAM  06/30/2019  . INFLUENZA VACCINE  Never done  . COLONOSCOPY (Pts 45-1yr Insurance coverage will need to be confirmed)  Never done    There are no preventive care reminders to display for this patient.  Lab Results  Component Value Date   TSH 1.447 07/19/2020   Lab Results  Component Value Date   WBC 10.5 07/28/2020   HGB 14.2 07/28/2020   HCT 42.2 07/28/2020   MCV 77.7 (L) 07/28/2020   PLT 327 07/28/2020   Lab Results  Component Value Date   NA 135 07/25/2020   K 3.9 07/25/2020   CO2 25 07/25/2020   GLUCOSE 264 (H) 07/25/2020   BUN 21 (H) 07/25/2020   CREATININE 0.99 07/25/2020   BILITOT 0.7 07/25/2020   ALKPHOS 52 07/25/2020   AST 18 07/25/2020   ALT 25 07/25/2020   PROT 6.5 07/25/2020   ALBUMIN 3.0 (L) 07/25/2020   CALCIUM 9.0 07/25/2020   ANIONGAP 10 07/25/2020   Lab Results  Component Value Date   CHOL 145 07/20/2020   Lab Results  Component Value Date   HDL 31 (L) 07/20/2020   Lab Results  Component Value Date   LDLCALC 102 (H) 07/20/2020   Lab Results  Component Value Date   TRIG 62 07/20/2020   Lab Results  Component Value Date   CHOLHDL 4.7 07/20/2020   Lab Results  Component Value Date   HGBA1C 7.7 (H) 07/20/2020      Assessment & Plan:   1. Hypertensive urgency Blood pressures are elevated today. After recheck, blood pressures remain elevated.  We referred him to ED via ambulance at this time. Patient refused and signed AMA form at  discharge. He denies severe headaches, confusion, seizures, double vision, and blurred vision, nausea and vomiting. He will report to ED if he experiences these symptoms. Patient verbalized understanding.   - losartan (COZAAR) 25 MG tablet; Take 1 tablet (25 mg total) by mouth in the morning and at bedtime.  Dispense: 180 tablet; Refill: 3 - carvedilol (COREG) 12.5 MG tablet; Take 1 tablet (12.5 mg total) by mouth 2 (two) times daily with a meal.  Dispense: 180 tablet; Refill: 3  2. Hypertension, unspecified type - losartan (COZAAR) 25 MG tablet; Take 1 tablet (25 mg total) by mouth in the morning and at bedtime.  Dispense: 180 tablet; Refill: 3 - carvedilol (COREG) 12.5 MG tablet; Take 1 tablet (12.5 mg total) by mouth 2 (two) times daily with a meal.  Dispense: 180 tablet; Refill: 3  3. Type 2 diabetes mellitus with complication, without long-term current use of insulin ([place]) He will continue medication as prescribed, to decrease foods/beverages high in sugars and carbs and follow Heart Healthy or DASH diet. Increase physical activity to at least 30 minutes cardio exercise daily.   4. Hemoglobin A1C greater than 9%, indicating poor diabetic control  5. Hyperglycemia  6. Noncompliance with medication regimen  7. Medication refill - losartan (COZAAR) 25 MG tablet; Take 1 tablet (25 mg total) by mouth in the morning and at bedtime.  Dispense: 180 tablet; Refill: 3 - carvedilol (COREG) 12.5 MG tablet; Take 1 tablet (12.5 mg total) by mouth 2 (two) times daily with a meal.  Dispense: 180 tablet; Refill: 3  8. Follow up He will follow up for blood pressure check in 1 week. He will follow up for Office Visit in 1 month.   Meds ordered this encounter  Medications  . losartan (COZAAR) 25 MG tablet    Sig: Take 1 tablet (25 mg total) by mouth in the morning and at bedtime.    Dispense:  180 tablet    Refill:  3  . carvedilol (COREG) 12.5 MG tablet    Sig: Take 1 tablet (12.5 mg total) by  mouth 2 (two) times daily with a meal.    Dispense:  180 tablet    Refill:  3    No orders of the defined types were placed in this encounter.   Referral Orders  No referral(s) requested today    Kathe Becton, MSN, ANE, FNP-BC [facility] [address] [place], [place] [address] [phone] fax   Problem List Items Addressed This Visit      Other   Hyperglycemia   Noncompliance with medication regimen    Other Visit Diagnoses    Hypertensive urgency    -  Primary   Relevant Medications   losartan (COZAAR) 25 MG tablet   carvedilol (COREG) 12.5 MG tablet   Hypertension, unspecified type       Relevant Medications   losartan (COZAAR) 25 MG tablet   carvedilol (COREG) 12.5 MG tablet   Type 2 diabetes mellitus with complication, without long-term current use of insulin (HCC)       Relevant Medications   losartan (COZAAR) 25 MG tablet   Hemoglobin A1C greater than 9%, indicating poor diabetic control       Relevant Medications   losartan (COZAAR) 25 MG tablet   Medication refill  Relevant Medications   losartan (COZAAR) 25 MG tablet   carvedilol (COREG) 12.5 MG tablet   Follow up          Meds ordered this encounter  Medications  . losartan (COZAAR) 25 MG tablet    Sig: Take 1 tablet (25 mg total) by mouth in the morning and at bedtime.    Dispense:  180 tablet    Refill:  3  . carvedilol (COREG) 12.5 MG tablet    Sig: Take 1 tablet (12.5 mg total) by mouth 2 (two) times daily with a meal.    Dispense:  180 tablet    Refill:  3    Follow-up: No follow-ups on file.    Azzie Glatter, FNP

## 2020-08-24 ENCOUNTER — Other Ambulatory Visit: Payer: Self-pay | Admitting: Family Medicine

## 2020-08-24 DIAGNOSIS — R7309 Other abnormal glucose: Secondary | ICD-10-CM

## 2020-08-24 DIAGNOSIS — E118 Type 2 diabetes mellitus with unspecified complications: Secondary | ICD-10-CM

## 2020-08-24 DIAGNOSIS — R739 Hyperglycemia, unspecified: Secondary | ICD-10-CM

## 2020-08-28 ENCOUNTER — Other Ambulatory Visit: Payer: Self-pay | Admitting: *Deleted

## 2020-08-28 ENCOUNTER — Other Ambulatory Visit: Payer: Self-pay | Admitting: Family Medicine

## 2020-08-28 DIAGNOSIS — E118 Type 2 diabetes mellitus with unspecified complications: Secondary | ICD-10-CM

## 2020-08-28 DIAGNOSIS — R739 Hyperglycemia, unspecified: Secondary | ICD-10-CM

## 2020-08-28 DIAGNOSIS — R7309 Other abnormal glucose: Secondary | ICD-10-CM

## 2020-08-28 MED ORDER — INSULIN ASPART 100 UNIT/ML ~~LOC~~ SOLN: 5.0000 [IU] | Freq: Three times a day (TID) | SUBCUTANEOUS | 3 refills | Status: DC

## 2020-08-28 MED ORDER — LANTUS SOLOSTAR 100 UNIT/ML ~~LOC~~ SOPN: 10.0000 [IU] | PEN_INJECTOR | Freq: Every day | SUBCUTANEOUS | 3 refills | Status: DC

## 2020-08-28 NOTE — Patient Outreach (Signed)
[facility]) Care Management  08/28/2020  Rodney Kane 03/16/1976 [id number]   Outgoing call placed to member, no answer, unable to leave voice message.  Will follow up within the next 3-4 business days.  [name], [place], MSN [facility] Care Management  [facility] Manager [phone]

## 2020-08-29 ENCOUNTER — Telehealth: Payer: Self-pay | Admitting: Family Medicine

## 2020-08-29 ENCOUNTER — Other Ambulatory Visit: Payer: Self-pay | Admitting: Family Medicine

## 2020-08-29 DIAGNOSIS — K5900 Constipation, unspecified: Secondary | ICD-10-CM

## 2020-08-29 MED ORDER — POLYETHYLENE GLYCOL 3350 17 GM/SCOOP PO POWD: 17.0000 g | Freq: Every day | ORAL | 1 refills | Status: DC

## 2020-08-29 NOTE — Telephone Encounter (Signed)
Done

## 2020-08-31 ENCOUNTER — Other Ambulatory Visit: Payer: Self-pay | Admitting: *Deleted

## 2020-08-31 NOTE — Patient Outreach (Signed)
[facility]) Care Management  08/31/2020  Andoni Busch October 12, 1975 [id number]   Outreach attempt #2, unsuccessful, unable to leave voice message.  Will send outreach letter and follow up within the next 3-4 business days.  [name], [place], MSN [facility] Care Management  [facility] Manager [phone]

## 2020-09-03 ENCOUNTER — Other Ambulatory Visit: Payer: Self-pay | Admitting: Family Medicine

## 2020-09-03 DIAGNOSIS — R7309 Other abnormal glucose: Secondary | ICD-10-CM

## 2020-09-03 DIAGNOSIS — R739 Hyperglycemia, unspecified: Secondary | ICD-10-CM

## 2020-09-03 DIAGNOSIS — E118 Type 2 diabetes mellitus with unspecified complications: Secondary | ICD-10-CM

## 2020-09-03 MED ORDER — SITAGLIPTIN PHOSPHATE 25 MG PO TABS: 25.0000 mg | ORAL_TABLET | Freq: Every day | ORAL | 3 refills | Status: DC

## 2020-09-03 MED ORDER — INSULIN DETEMIR 100 UNIT/ML ~~LOC~~ SOLN: 10.0000 [IU] | Freq: Every day | SUBCUTANEOUS | 11 refills | Status: DC

## 2020-09-05 ENCOUNTER — Telehealth: Payer: Self-pay | Admitting: Licensed Clinical Social Worker

## 2020-09-05 ENCOUNTER — Ambulatory Visit (INDEPENDENT_AMBULATORY_CARE_PROVIDER_SITE_OTHER): Payer: BC Managed Care – PPO

## 2020-09-05 ENCOUNTER — Encounter: Payer: Self-pay | Admitting: Cardiology

## 2020-09-05 ENCOUNTER — Other Ambulatory Visit: Payer: Self-pay

## 2020-09-05 ENCOUNTER — Ambulatory Visit (INDEPENDENT_AMBULATORY_CARE_PROVIDER_SITE_OTHER): Payer: BC Managed Care – PPO | Admitting: Cardiology

## 2020-09-05 VITALS — BP 140/104 | HR 94 | Ht 73.0 in | Wt 301.4 lb

## 2020-09-05 DIAGNOSIS — I513 Intracardiac thrombosis, not elsewhere classified: Secondary | ICD-10-CM

## 2020-09-05 DIAGNOSIS — I5041 Acute combined systolic (congestive) and diastolic (congestive) heart failure: Secondary | ICD-10-CM

## 2020-09-05 DIAGNOSIS — I10 Essential (primary) hypertension: Secondary | ICD-10-CM

## 2020-09-05 DIAGNOSIS — Z9114 Patient's other noncompliance with medication regimen: Secondary | ICD-10-CM

## 2020-09-05 DIAGNOSIS — I639 Cerebral infarction, unspecified: Secondary | ICD-10-CM

## 2020-09-05 DIAGNOSIS — I428 Other cardiomyopathies: Secondary | ICD-10-CM

## 2020-09-05 DIAGNOSIS — Z7901 Long term (current) use of anticoagulants: Secondary | ICD-10-CM

## 2020-09-05 LAB — POCT INR: INR: 4.3 — AB (ref 2.0–3.0)

## 2020-09-05 MED ORDER — FUROSEMIDE 40 MG PO TABS: 40.0000 mg | ORAL_TABLET | Freq: Every day | ORAL | 3 refills | Status: DC

## 2020-09-05 MED ORDER — POTASSIUM CHLORIDE CRYS ER 20 MEQ PO TBCR: 20.0000 meq | EXTENDED_RELEASE_TABLET | Freq: Every day | ORAL | 3 refills | Status: DC

## 2020-09-05 NOTE — Patient Instructions (Signed)
Hold tonight and tomorrow night and then decrease to 1 tablet Daily except 0.5 tablet on Monday, Wednesday and Friday.  INR to be done in 1 week.

## 2020-09-05 NOTE — Patient Instructions (Signed)
Medication Instructions:  START- Furosemide(Lasix) 40 mg take 80 mg (2 tablets) by mouth for three(3) days then 40 mg by mouth daily RESTART- Potassium 20 meq by mouth daily  *If you need a refill on your cardiac medications before your next appointment, please call your pharmacy*   Lab Work: None Ordered  Testing/Procedures: None Ordered   Follow-Up: At [facility], you and your health needs are our priority.  As part of our continuing mission to provide you with exceptional heart care, we have created designated Provider Care Teams.  These Care Teams include your primary Cardiologist (physician) and Advanced Practice Providers (APPs -  Physician Assistants and Nurse Practitioners) who all work together to provide you with the care you need, when you need it.  We recommend signing up for the patient portal called "MyChart".  Sign up information is provided on this After Visit Summary.  MyChart is used to connect with patients for Virtual Visits (Telemedicine).  Patients are able to view lab/test results, encounter notes, upcoming appointments, etc.  Non-urgent messages can be sent to your provider as well.   To learn more about what you can do with MyChart, go to [email or web address].    Your next appointment:   Friday February 25 th @ 10:40 am  The format for your next appointment:   In Person  Provider:   You will see one of the following Advanced Practice Providers on your designated Care Team:    Corine Shelter, [place]

## 2020-09-05 NOTE — Progress Notes (Signed)
Heart and Vascular Care Navigation  09/05/2020  Rodney Kane June 06, 1976 [id number]  Reason for Referral: Can't afford insulin                                                                                                     Assessment:        LCSW was able to reach pt on my third call today. Introduced self, role, reason for call. His number is a landline [phone]). Pt confirms that his listed address is his mailing address but he has been residing w/ his son in [place] at [address] [place]. He [facility] and that he utilizes a [facility] pharmacy currently. He confirms BCBS coverage and states he is on FMLA from his job at [employer] currently. If pt cannot be reached at his phone he gives me permission to speak with his daughter [name] at [phone]. Pt son currently is paying for most of his bills including rent/utilities. He is having difficulty with affording his medications related to his diabetes. I shared that unfortunately there are not assistance programs for insulin since he currently has [employer], but that it may be more affordable to get those medications filled at the [facility] near her PCP. He currently is able to get food but is also interested in applying for SNAP to assist with his current food costs. I explained that [facility] (a local partner non profit) would be able to accept referral and talk to pt about his eligibility. Pt gives permission for me to start that referral form. Pt has upcoming appointment next week and I will meet with him again then. Pt has been driving himself to his appointments but also has been enrolled in transportation services should he need a ride.                             HRT/VAS Care Coordination    Patients Home Cardiology Office [facility]   Outpatient Care Team Social Worker   Social Worker Name: Esmeralda Links Bradley,  [phone]   Living arrangements for the past 2 months Apartment   Lives with: Adult Children   Patient Current Insurance Scientist, water quality   Patient Has Concern With Paying Medical Bills No   Does Patient Have Prescription Coverage? Yes   Home Assistive Devices/Equipment None   DME Agency NA   [facility] Agency [facility]      Social History:                                                                             SDOH Screenings   Alcohol Screen: Not on file  Depression (PHQ2-9): Low Risk   .  PHQ-2 Score: 1  Financial Resource Strain: Not on file  Food Insecurity: No Food Insecurity  . Worried About Programme researcher, broadcasting/film/video in the Last Year: Never true  . Ran Out of Food in the Last Year: Never true  Housing: Low Risk   . Last Housing Risk Score: 0  Physical Activity: Inactive  . Days of Exercise per Week: 0 days  . Minutes of Exercise per Session: 0 min  Social Connections: Not on file  Stress: Stress Concern Present  . Feeling of Stress : To some extent  Tobacco Use: Low Risk   . Smoking Tobacco Use: Never Smoker  . Smokeless Tobacco Use: Never Used  Transportation Needs: No Transportation Needs  . Lack of Transportation (Medical): No  . Lack of Transportation (Non-Medical): No    SDOH Interventions: Financial Resources:  Financial Strain Interventions: Other (Comment) (referral to LCSW at [facility]; SNAP referral to [facility])  Food Insecurity:  Food Insecurity Interventions: Assist with [employer] (referral to [employer])  Housing Insecurity:  Housing Interventions: Intervention Not Indicated  Transportation:   Transportation Interventions: Retail banker    Follow-up plan:   I will start referral forms for SNAP- I have contacted CSW at his PCP office in case there is additional assistance they may be able to provide. I plan to meet with pt during his upcoming appt on 2/25.

## 2020-09-05 NOTE — Progress Notes (Signed)
Cardiology Office Note:    Date:  09/05/2020   ID:  Rodney Kane, [name] Dec 12, 1975, MRN [id number]  PCP:  Kallie Locks, FNP  Cardiologist:  No primary care provider on file.  Electrophysiologist:  None   Referring MD: Kallie Locks, FNP   CC: edema  History of Present Illness:    Rodney Kane is a 45 y.o. male with a hx of nonischemic cardiomyopathy, poorly controlled hypertension, insulin-dependent diabetes which is not controlled secondary to noncompliance, and a recent stroke December 2021.  At that time he was found to have an EF of 20 to 25% with an apical thrombus.  He had normal coronaries in 2018.  Patient was put on medical therapy and discharge.  He did follow-up with his primary care provider August 21, 2020.  He reported compliance with his medications.  His blood pressure was markedly elevated at that visit and they actually encouraged him to go to the emergency room.  He declined and signed the AMA form.  His medications were adjusted, his losartan is 25 mg twice daily and Coreg 12.5 mg twice daily.  He presents to the office today for follow-up.  We have not seen him since he was discharged.  His discharge weight was 266 pounds, his weight today is 301 pounds.  He is noted increasing lower extremity edema.  He has not on a diuretic currently.  He does not appear to be unusually short of breath at rest.  Past Medical History:  Diagnosis Date  . Coronavirus infection 06/2020  . Diabetes mellitus without complication (HCC)   . Hemoglobin A1C greater than 9%, indicating poor diabetic control 12/2019  . Hyperglycemia 12/2019  . Hyperlipidemia 12/2019  . Hypertension   . Hypertensive urgency 12/2019  . Noncompliance with medication regimen 12/2019  . Stroke (HCC)   . Urine ketones 12/2019  . Vitamin D deficiency 12/2019    Past Surgical History:  Procedure Laterality Date  . left knee surgery    . RIGHT/LEFT HEART CATH AND CORONARY ANGIOGRAPHY  N/A 01/05/2017   Procedure: Right/Left Heart Cath and Coronary Angiography;  Surgeon: [name], MD;  Location: [facility] INVASIVE CV LAB;  Service: Cardiovascular;  Laterality: N/A;    Current Medications: No outpatient medications have been marked as taking for the 09/05/20 encounter (Office Visit) with Rodney Derrick, PA-C.     Allergies:   Patient has no known allergies.   Social History   Socioeconomic History  . Marital status: Divorced    Spouse name: Not on file  . Number of children: 3  . Years of education: Not on file  . Highest education level: Not on file  Occupational History  . Not on file  Tobacco Use  . Smoking status: Never Smoker  . Smokeless tobacco: Never Used  Vaping Use  . Vaping Use: Never used  Substance and Sexual Activity  . Alcohol use: No  . Drug use: No  . Sexual activity: Yes  Other Topics Concern  . Not on file  Social History Narrative  . Not on file   Social Determinants of Health   Financial Resource Strain: Not on file  Food Insecurity: No Food Insecurity  . Worried About Programme researcher, broadcasting/film/video in the Last Year: Never true  . Ran Out of Food in the Last Year: Never true  Transportation Needs: Unmet Transportation Needs  . Lack of Transportation (Medical): Yes  . Lack of Transportation (Non-Medical): Yes  Physical Activity: Not  on file  Stress: Not on file  Social Connections: Not on file     Family History: The patient's family history includes Diabetic kidney disease in his mother; Hypertension in his mother; Multiple sclerosis in his mother.  ROS:   Please see the history of present illness.  Pt stutters- worse after his stroke    All other systems reviewed and are negative.  EKGs/Labs/Other Studies Reviewed:    The following studies were reviewed today: Echo 07/20/2020- IMPRESSIONS    1. A small (8 mm diameter), almost spherical, slightly mobile ventricular  apical thrombus is seen. Left ventricular ejection fraction,  by  estimation, is 20 to 25%. The left ventricle has severely decreased  function. The left ventricle demonstrates  global hypokinesis. The left ventricular internal cavity size was  moderately dilated. Indeterminate diastolic filling due to E-A fusion.  2. Right ventricular systolic function is mildly reduced. The right  ventricular size is normal. Tricuspid regurgitation signal is inadequate  for assessing PA pressure.  3. Left atrial size was moderately dilated.  4. A small pericardial effusion is present. The pericardial effusion is  localized near the right atrium.  5. The mitral valve is normal in structure. No evidence of mitral valve  regurgitation. No evidence of mitral stenosis.  6. The aortic valve is tricuspid. Aortic valve regurgitation is not  visualized. No aortic stenosis is present.  7. The inferior vena cava is normal in size with greater than 50%  respiratory variability, suggesting right atrial pressure of 3 mmHg.   Comparison(s): Prior images unable to be directly viewed, comparison made  by report only. The left ventricular function is worsened. A new apical LV  thrombus is seen.   EKG:  EKG is ordered today.  The ekg ordered today demonstrates NSR, poor anterior RW, HR 92  Recent Labs: 07/19/2020: B Natriuretic Peptide 1,619.5; TSH 1.447 07/22/2020: Magnesium 2.0 07/25/2020: ALT 25; BUN 21; Creatinine, Ser 0.99; Potassium 3.9; Sodium 135 07/28/2020: Hemoglobin 14.2; Platelets 327  Recent Lipid Panel    Component Value Date/Time   CHOL 145 07/20/2020 0416   TRIG 62 07/20/2020 0416   HDL 31 (L) 07/20/2020 0416   CHOLHDL 4.7 07/20/2020 0416   VLDL 12 07/20/2020 0416   LDLCALC 102 (H) 07/20/2020 0416    Physical Exam:    VS:  BP (!) 140/104   Pulse 94   Ht 6\' 1"  (1.854 m)   Wt (!) 301 lb 6.4 oz (136.7 kg)   SpO2 99%   BMI 39.76 kg/m     Wt Readings from Last 3 Encounters:  09/05/20 (!) 301 lb 6.4 oz (136.7 kg)  08/21/20 274 lb 9.6 oz (124.6 kg)   07/28/20 266 lb 8.6 oz (120.9 kg)     GEN: morbidly obese AA male, well developed in no acute distress HEENT: Normal NECK: No JVD; No carotid bruits CARDIAC: RRR, no murmurs, rubs, gallops RESPIRATORY:  Clear to auscultation without rales, wheezing or rhonchi  ABDOMEN: Soft, non-tender, non-distended MUSCULOSKELETAL:  1+ edema in both LE; No deformity  SKIN: Warm and dry NEUROLOGIC:  Alert and oriented x 3- stuttering speech PSYCHIATRIC:  Normal affect   ASSESSMENT:    Acute systolic CHF- Weight up 35 lbs since discharge- he is not currently on a diuretic- add Lasix 80 mg QD x 3 then 40 mg daily.  F/U in the office in 7-10 days, check labs then.  Uncontrolled HTN- repeat B/P by me 128/90 with large cuff   LV thrombus  New finding  Pt currently on warfarin - check INR  NICM-    LVEF 20 to 25%  Down from previous 30 to 35%  EF in 2018.   He may be a candidate for Entresto if he can demonstrate compliance.   CVA- dec 2021- Large L MCA and punctate R frontparietal infarcts     Some phemorrhagic transformation.   Felt to be embolic with mural thrombus present   IDDM- uncontrolled- he can't afford insulin  Noncompliance-   PLAN:    Add lasix 80 mg QD x 3, then 40 mg daily.  K+ 20 MEq daily.  F/U office visit 7-10 days.  Check INR today.    Medication Adjustments/Labs and Tests Ordered: Current medicines are reviewed at length with the patient today.  Concerns regarding medicines are outlined above.  No orders of the defined types were placed in this encounter.  No orders of the defined types were placed in this encounter.   There are no Patient Instructions on file for this visit.   Jolene Provost, PA-C  09/05/2020 10:24 AM    [facility]

## 2020-09-05 NOTE — Telephone Encounter (Signed)
Referral from CMA Tiwan for assistance w/ insulin/diabetes medications.  Confirmed w/ Annice Pih, team lead that pt commercially insured and it is not a cardiology medication; therefore we are limited in being able to financially assist. I will refer pt to [name] he [facility] followed for primary care and who manages his diabetes medications. I will also f/u in case there are additional needs that we may be able to assist pt with connecting w/ resources for at this time. Have made Tiwan aware of the above.   Octavio Graves, MSW, LCSW [facility] Heart/Vascular Care Navigation  [phone]

## 2020-09-05 NOTE — Telephone Encounter (Signed)
Attempted to reach pt at listed number [phone], no answer and no voicemail.  Will reattempt again later this afternoon. No DPR on file at this time.  Octavio Graves, MSW, LCSW [facility] Heart/Vascular Care Navigation  [phone]

## 2020-09-06 ENCOUNTER — Other Ambulatory Visit: Payer: Self-pay | Admitting: *Deleted

## 2020-09-06 ENCOUNTER — Encounter: Payer: Self-pay | Admitting: *Deleted

## 2020-09-06 NOTE — Patient Outreach (Signed)
[facility]) Care Management  09/06/2020  Rodney Kane [birth year] [id number]   Outreach attempt #3, successful.  Member continues to have expressive aphasia and stuttering, difficult to understand at times.  Noted that PCP office note on 2/1 mentioned speech therapy, unclear if referral was sent, will follow up.  He was seen in cardiology office yesterday, blood pressure and weight were elevated, placed to Lasix and antihypertensives were adjusted.  He reports taking them as instructed, state blood pressure is now down although he is unable to report a specific reading.  Confirms he does have blood pressure machine, state he will start using daily.  He does not have scale or blood sugar meter for daily monitoring.  Will send scale from [facility], request made to PCP for prescription for blood sugar meter.    Report he has all medications on active list with the exception of insulins due to cost.  [facility] pharmacist has been working with member and PCP to change prescriptions to meds that were covered by [employer]  Medications were changed to Levemir, Novolog, and Januvia however member still state he is unable to afford.  Call was placed to [facility], they have all of the previously mentioned diabetic medications in the store, Novolog will cost $84, Levemir $89, and Januvia $100.  Will provide update to pharmacist.  Member has follow up appointments with coumadin clinic on 2/25, cardiology 2/25, PCP on 3/2, and neurology on 3/23.  Will collaborate with pharmacist.  Will call PCP office to follow up on speech therapy and CBG meter.  Will follow up with member within the next week.  Goals Addressed            This Visit's Progress   . [facility] - Find Help in My Community   On track    Timeframe:  Short-Term Goal Priority:  High Start Date:       08/09/2020                      Expected End Date:       09/09/2020                Follow Up Date 2/8   - follow-up on any  referrals for help I am given - think ahead to make sure my need does not become an emergency    Why is this important?    Knowing how and where to find help for yourself or family in your neighborhood and community is an important skill.   You will want to take some steps to learn how.    Notes:   1/28 - Confirmed member has been in contact with [facility] pharmacist  1/20 - Referral placed to [facility] - not eligible due to age  61/17 - Call placed to PCP office to request referral to outpatient speech therapy    . [facility] - Make and Keep All Appointments   On track    Timeframe:  Short-Term Goal Priority:  Medium Start Date:          08/09/2020                   Expected End Date:       09/09/2020                Follow Up Date 2/24    - ask family or friend for a ride - call to cancel if needed - keep a calendar with appointment  dates    Why is this important?    Part of staying healthy is seeing the doctor for follow-up care.   If you forget your appointments, there are some things you can do to stay on track.    Notes:   1/28 - Call placed to reschedule cardiology visit  2/17 - Upcoming appointments reviewed with member    . [facility] - Track and Manage My Blood Pressure-Hypertension   On track    Timeframe:  Long-Range Goal Priority:  Medium Start Date:         08/09/2020                    Expected End Date:  10/07/2020                     Follow Up Date 2/24   - check blood pressure daily - write blood pressure results in a log or diary    Why is this important?    You won't feel high blood pressure, but it can still hurt your blood vessels.   High blood pressure can cause heart or kidney problems. It can also cause a stroke.   Making lifestyle changes like losing a little weight or eating less salt will help.   Checking your blood pressure at home and at different times of the day can help to control blood pressure.   If the doctor prescribes medicine remember to  take it the way the doctor ordered.   Call the office if you cannot afford the medicine or if there are questions about it.     Notes:   1/28 - Reminded to monitor blood pressure and record daily  2/17 - Confirmed member has BP monitor, will send scale for daily weights      [name], RN, MSN [facility] Care Management  [facility] Manager [phone]

## 2020-09-07 ENCOUNTER — Telehealth: Payer: Self-pay | Admitting: Licensed Clinical Social Worker

## 2020-09-07 NOTE — Telephone Encounter (Signed)
Spoke with pt this morning, he is aware I have started his application for SNAP and will meet with him during his appointments on 2/25 to sign and go over any additional potential resources. Pt aware and will call if any additional questions or concerns beforehand.   Rodney Kane, MSW, LCSW [facility] Heart/Vascular Care Navigation  [phone]

## 2020-09-13 ENCOUNTER — Other Ambulatory Visit: Payer: Self-pay | Admitting: *Deleted

## 2020-09-13 NOTE — Patient Outreach (Signed)
[facility]) Care Management  09/13/2020  Rodney Kane 10-12-1975 [id number]   Outgoing call placed to member to remind him of upcoming appointment with PCP on 3/2.  Advised that this care manager will accompany him to visit for care coordination needs.  Also advised to take all medications with him for review.  Verbalizes understanding.  [name], [place], MSN [facility] Care Management  [facility] Manager [phone]

## 2020-09-14 ENCOUNTER — Other Ambulatory Visit: Payer: Self-pay

## 2020-09-14 ENCOUNTER — Ambulatory Visit (INDEPENDENT_AMBULATORY_CARE_PROVIDER_SITE_OTHER): Payer: BC Managed Care – PPO | Admitting: Cardiology

## 2020-09-14 ENCOUNTER — Ambulatory Visit (INDEPENDENT_AMBULATORY_CARE_PROVIDER_SITE_OTHER): Payer: BC Managed Care – PPO

## 2020-09-14 ENCOUNTER — Telehealth: Payer: Self-pay | Admitting: Licensed Clinical Social Worker

## 2020-09-14 VITALS — BP 130/100 | HR 90 | Ht 73.0 in | Wt 299.2 lb

## 2020-09-14 DIAGNOSIS — I5041 Acute combined systolic (congestive) and diastolic (congestive) heart failure: Secondary | ICD-10-CM | POA: Diagnosis not present

## 2020-09-14 DIAGNOSIS — I10 Essential (primary) hypertension: Secondary | ICD-10-CM | POA: Diagnosis not present

## 2020-09-14 DIAGNOSIS — I639 Cerebral infarction, unspecified: Secondary | ICD-10-CM

## 2020-09-14 DIAGNOSIS — Z7901 Long term (current) use of anticoagulants: Secondary | ICD-10-CM

## 2020-09-14 DIAGNOSIS — Z9114 Patient's other noncompliance with medication regimen: Secondary | ICD-10-CM

## 2020-09-14 DIAGNOSIS — I513 Intracardiac thrombosis, not elsewhere classified: Secondary | ICD-10-CM

## 2020-09-14 DIAGNOSIS — I428 Other cardiomyopathies: Secondary | ICD-10-CM

## 2020-09-14 DIAGNOSIS — I480 Paroxysmal atrial fibrillation: Secondary | ICD-10-CM

## 2020-09-14 LAB — POCT INR: INR: 1.5 — AB (ref 2.0–3.0)

## 2020-09-14 MED ORDER — FUROSEMIDE 40 MG PO TABS: ORAL_TABLET | ORAL | 2 refills | Status: DC

## 2020-09-14 MED ORDER — SPIRONOLACTONE 25 MG PO TABS: 25.0000 mg | ORAL_TABLET | Freq: Every day | ORAL | 3 refills | Status: DC

## 2020-09-14 MED ORDER — LOSARTAN POTASSIUM 50 MG PO TABS: 50.0000 mg | ORAL_TABLET | Freq: Two times a day (BID) | ORAL | 2 refills | Status: DC

## 2020-09-14 NOTE — Progress Notes (Signed)
Primary Care Provider: Kallie LocksStroud, Natalie M, FNP Cardiologist: Bryan Lemmaavid Daishon Chui, MD Electrophysiologist: None  Clinic Note: Chief Complaint  Patient presents with  . Follow-up  . Congestive Heart Failure    Not much response to Lasix    ===================================  ASSESSMENT/PLAN   Problem List Items Addressed This Visit    Nonischemic cardiomyopathy (HCC) - Primary    Most consistent with hypertensive cardiomyopathy with normal coronary arteries.  Persistently reduced EF now with LV thrombus. One major issue is medical adherence.  His blood pressure is 130/100.  He has moderate dose carvedilol and low-dose losartan listed along with spironolactone.  Apparently has not taken spironolactone.  Plan: Need labs today with us to adjust medications,  Titrate losartan to 50 mg, [facility].  We also have room to probably increase carvedilol up to 12.5 mg twice daily  Make sure that he is taking spironolactone (want to make sure we see what his potassium level is).  He needs more aggressive diuresis-we will increase to 80 mg daily for a week and then we will then convert to alternating 80 mg 1 day and 40 mg a day.       Relevant Medications   losartan (COZAAR) 50 MG tablet   furosemide (LASIX) 40 MG tablet   Noncompliance with medication regimen    I admonished him on the importance of staying on medications.  His heart will only get worse if he does not take medicines correctly.      Essential hypertension    Not as well-controlled as well as we would like.  Plan:   Increase losartan to 50 mg daily.  Cost assessment evaluation for [facility].  Ensure that he is taking spironolactone 25 mg daily (pending potassium).  Continue current dose of carvedilol.  Once he is on Entresto, can titrate further.       Relevant Medications   losartan (COZAAR) 50 MG tablet   furosemide (LASIX) 40 MG tablet   Other Relevant  Orders   EKG 12-Lead (Completed)   Comprehensive metabolic panel (Completed)   Brain natriuretic peptide (Completed)   Acute CVA (cerebrovascular accident) (HCC)   Relevant Medications   losartan (COZAAR) 50 MG tablet   furosemide (LASIX) 40 MG tablet   Other Relevant Orders   EKG 12-Lead (Completed)   Comprehensive metabolic panel (Completed)   Brain natriuretic peptide (Completed)   Long term (current) use of anticoagulants    On warfarin long-term based on LV apical thrombus.      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  Would like to finally get him on Entresto, but need to cost evaluation for that.  We will set this up for CVRR with BP follow-up.  Plan: Increase losartan to 50 mg daily Go back to taking 80 mg Lasix daily for 1 week.  Following that every other day take 80 mg and 40 mg.   Monitor daily weights.  Would like to have him follow-up with CVRR in order to potentially switch to [facility]resto and then potentially add [place]xiga or Jardiance.      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  Due to have labs checked today.      Relevant Medications   losartan (COZAAR) 50 MG tablet   furosemide (LASIX) 40 MG tablet   Atrial fibrillation (HCC) (Chronic)    This is a listed diagnosis, rate seems controlled.  He is on beta-blocker. Also on warfarin.   This patients CHA2DS2-VASc Score and unadjusted Ischemic Stroke Rate (% per year) is equal to 3.2 % stroke rate/year from a score of 3  Above score calculated as 1 point each if present [CHF, HTN, DM, Vascular=MI/PAD/Aortic Plaque, Age if 65-74, or Male] Above score  calculated as 2 points each if present [Age > 75, or Stroke/TIA/TE]        Relevant Medications   losartan (COZAAR) 50 MG tablet   furosemide (LASIX) 40 MG tablet     ===================================  HPI:    Rodney Kane is a 45 y.o. male with a PMH notable for NONISCHEMIC CARDIOMYOPATHY-EF 20-25% (HYPERTENSIVE HEART DISEASE), poorly controlled DM-2 on Insulin, CVA in December 2021-diagnosed with LV APICAL THROMBUS -shortly after COVID 19 infection, (Medical Noncompliance) who presents today for 2-week follow-up.  Rodney Kane was originally evaluated in June 2018 when he presented with significant dyspnea and aCcelerated Hypertension/Hypertensive Urgency (Blood Pressure over 200 mmHg.  For Salary tachycardic with rates in the 110s to 120s.  He had mild troponin elevation and severely reduced EF on echo of roughly 30 to 35% with diffuse HK.  [facility] indicated nonischemic cardiomyopathy (likely hypertensive cardiomyopathy) with normal diuresis from 357 down to 348 pounds. => He only went to 1 follow-up visit on January 15, 2017-where he saw Azalee Course, [place].  He appeared relatively euvolemic at that time, but blood pressure elevated because he did not take his medications on time to come to the visit.  He was started on low-dose losartan along with carvedilol and hydralazine. => He was essentially lost to follow-up until his hospitalization in December 2021.  Recent Hospitalizations:   [facility], ER 12/27-28/2021-presented with increasing chest pain and exertional dyspnea.  Apparently he was taking Tylenol with phenylephrine.  Noted be extremely tachycardic.  PE protocol negative for CT  07/19/2020-07/28/2020-admitted with acute CVA-expressive aphasia, staggering gait. => Diagnosed with severe cardiomyopathy with LV apical thrombus, started on warfarin.  He was discharged home with home health to draw warfarin levels.  Felt to be euvolemic on exam.  Not discharged on diuretic.  Was  on losartan and carvedilol.  Rodney Kane was last seen on September 05, 2020 by Mr. Diona Fanti.  This was his first cardiology follow-up since the hospitalization.  He did report at that time complaints of medications, however he is weight was up to 301 pounds from the 266 pounds he weighed on discharge.  He noted significantly increased lower extremity edema and was not currently taking a diuretic.  Did not necessarily seem to be all that short of breath. -> With 30 pound weight gain, Lasix 80 mg daily for 3 days followed by 40 mg daily was started with planned 7-day follow-up. -> Repeat blood pressure showed 120/90.  Consider the possibility of switching to [facility], depending on compliance.   --> He has been following with [facility]  Reviewed  CV studies:    The following studies were reviewed today: (if available, images/films reviewed: From Epic Chart or Care Everywhere) . TTE 01/04/2017: EF 30 to 35%.  Moderate LVH.  Moderate severely reduced EF.  Diffuse HK.  Normal valves. . R&LHC 01/05/2017: Angiographic normal coronary arteries.  Normal LVEDP.  Normal PCWP. Right Heart Pressures PA pressure 27/9/19 mmHg - normal pressures, No Pulm HTN PCWP: 15 mmHg LV EDP is normal. LVP/EDP: 120/4/9 mmHg AoP/MAP: 127/93/107 mmHg AoSat: 95%, PA Sat 69% Cardiac Output / Index: 7.84 / 2.87  Right Atrium Right atrial pressure is normal. RAP/mean: 8/4/4 mmHg  Right Ventricle The right ventricular size is normal. The systolic function is normal. RVP/EDP: 32/2/7 mmHg   . TTE 07/20/2020: Severely reduced LV function-EF 20 to 25%, global HK.  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  Mildly reduced cardiac function.  Unable to assess PA pressures.  Moderate LA dilation.  Small pericardial effusion.  Relatively normal mitral valve and tricuspid valve.  Normal RA pressures.  Interval History:   Rodney Kane is here today for  the first time I have seen him since 2018.  He still has signif (icant baseline dyspnea and edema.  He says that he responded well after initially taking the higher dose of Lasix, but is now pretty much back again.  He has swelling up to his knees.  He does have orthopnea, and PND but that has improved some.  Also notes exertional dyspnea with minimal exertion.  He apparently has not been taking spironolactone, but says he is taking his other medications.  He is having his INR checked here today.  CV Review of Symptoms (Summary) Cardiovascular ROS: positive for - dyspnea on exertion, edema, irregular heartbeat, orthopnea, paroxysmal nocturnal dyspnea, shortness of breath and Not that much different than when he saw Franky Macho a couple weeks ago.  Initially felt better, but now back to where he was. negative for - chest pain, palpitations, rapid heart rate or Although he has had some lightheadedness and dizziness, no syncope or near syncope, no TIA or amaurosis fugax.  No claudication.  Not walking that much.  The patient does not have symptoms concerning for COVID-19 infection (fever, chills, cough, or new shortness of breath).   REVIEWED OF SYSTEMS   Review of Systems  Constitutional: Positive for malaise/fatigue. Negative for weight loss.  Respiratory: Positive for cough and shortness of breath.   Cardiovascular: Positive for leg swelling.  Gastrointestinal: Negative for abdominal pain, blood in stool and constipation.       Increase abdominal fullness  Genitourinary: Negative for dysuria and frequency.       Not responding as well to the Lasix now.  Musculoskeletal: Positive for back pain and joint pain.  Neurological: Positive for dizziness (Sometimes if he moves too fast.) and headaches. Negative for weakness.  Psychiatric/Behavioral: Positive for memory loss. The patient is not nervous/anxious (Although he thinks he can get anxious) and does not have insomnia.     I have reviewed and (if  needed) personally updated the patient's problem list, medications, allergies, past medical and surgical history, social and family history.   PAST MEDICAL HISTORY   Past Medical History:  Diagnosis Date  . Coronavirus infection 06/2020  . COVID 06/2020  . Diabetes mellitus without complication (HCC)   . Hemoglobin A1C greater than 9%, indicating poor diabetic control 12/2019  . Hyperglycemia 12/2019  . Hyperlipidemia 12/2019  . Hypertension   . Hypertensive urgency 12/2019  . Noncompliance with medication regimen 12/2019  . Nonischemic (hypertensive) dilated cardiomyopathy (HCC) 12/2016   EF has been 30 to 35%, recently lower.  . Stroke (HCC)   . Urine ketones 12/2019  . Vitamin D deficiency 12/2019  PAST SURGICAL HISTORY   Past Surgical History:  Procedure Laterality Date  . left knee surgery    . RIGHT/LEFT HEART CATH AND CORONARY ANGIOGRAPHY N/A 01/05/2017   Procedure: Right/Left Heart Cath and Coronary Angiography;  Surgeon: [name], MD;  Location: [facility] INVASIVE CV LAB;  Service: Cardiovascular; angiograph normal coronaries.  PAP mean 19 million mercury, PCWP 15 mmHg.  LVEDP 9 mmHg.  Cardiac output/index 7.84/2.7.  RAP mean 4 mmHg.  Marland Kitchen TRANSTHORACIC ECHOCARDIOGRAM  01/04/2017   EF 30 to 35%.  Moderate LVH.  Moderate severely reduced EF.  Diffuse HK.  Normal valves.  . TRANSTHORACIC ECHOCARDIOGRAM  07/20/2020    Severely reduced LV function-EF 20 to 25%, global HK.  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  Mildly reduced cardiac function.  Unable to assess PA pressures.  Moderate LA dilation.  Small pericardial effusion.  Relatively normal mitral valve and tricuspid valve.  Normal RA pressures.    Immunization History  Administered Date(s) Administered  . Tdap 12/01/2017    MEDICATIONS/ALLERGIES   Current Meds  Medication Sig  . atorvastatin (LIPITOR) 10 MG tablet Take 1 tablet (10 mg total) by mouth daily.  . carvedilol (COREG)  12.5 MG tablet Take 1 tablet (12.5 mg total) by mouth 2 (two) times daily with a meal.  . losartan (COZAAR) 50 MG tablet Take 1 tablet (50 mg total) by mouth 2 (two) times daily.  . potassium chloride SA (KLOR-CON) 20 MEQ tablet Take 1 tablet (20 mEq total) by mouth daily.  . Vitamin D, Ergocalciferol, (DRISDOL) 1.25 MG (50000 UNIT) CAPS capsule Take 1 capsule (50,000 Units total) by mouth every 7 (seven) days. (Patient taking differently: Take 50,000 Units by mouth every 7 (seven) days. Mondays)  . warfarin (COUMADIN) 5 MG tablet Take 1 tablet (5 mg total) by mouth daily at 4 PM.  . zinc sulfate 220 (50 Zn) MG capsule Take 1 capsule (220 mg total) by mouth daily. (Patient not taking: No sig reported)  . [DISCONTINUED] furosemide (LASIX) 40 MG tablet Take 1 tablet (40 mg total) by mouth daily.  . [DISCONTINUED] glipiZIDE (GLUCOTROL) 10 MG tablet Take 1 tablet (10 mg total) by mouth 2 (two) times daily before a meal.  . [DISCONTINUED] losartan (COZAAR) 25 MG tablet Take 1 tablet (25 mg total) by mouth in the morning and at bedtime.  . [DISCONTINUED] spironolactone (ALDACTONE) 25 MG tablet Take 1 tablet (25 mg total) by mouth daily. (Patient not taking: No sig reported)  -> He was taking 25 mg losartan today.  Dose above was titrated up today. Was not taking spironolactone  No Known Allergies  SOCIAL HISTORY/FAMILY HISTORY   Reviewed in Epic:  Pertinent findings:  Social History   Tobacco Use  . Smoking status: Never Smoker  . Smokeless tobacco: Never Used  Vaping Use  . Vaping Use: Never used  Substance Use Topics  . Alcohol use: No  . Drug use: No   Social History   Social History Narrative  . Not on file    OBJCTIVE -PE, EKG, labs   Wt Readings from Last 3 Encounters:  09/27/20 292 lb 15.9 oz (132.9 kg)  09/19/20 293 lb (132.9 kg)  09/14/20 299 lb 3.2 oz (135.7 kg)    Physical Exam: BP (!) 130/100 (BP Location: Left Arm, Patient Position: Sitting, Cuff Size: Large)    Pulse 90   Ht 6\' 1"  (1.854 m)   Wt 299 lb 3.2 oz (135.7 kg)   BMI  39.47 kg/m  Physical Exam Vitals reviewed.  Constitutional:      General: He is not in acute distress.    Appearance: He is ill-appearing. He is not toxic-appearing.     Comments: Morbidly obese gentleman.  Relatively well-groomed.  HENT:     Head: Normocephalic and atraumatic.  Neck:     Vascular: Hepatojugular reflux and JVD (8-10 cm water.) present. No carotid bruit.  Cardiovascular:     Rate and Rhythm: Normal rate and regular rhythm.  No extrasystoles are present.    Chest Wall: PMI is not displaced.     Heart sounds: S1 normal. Heart sounds are distant. No murmur heard. No friction rub. Gallop present. S4 sounds present.   Pulmonary:     Effort: Pulmonary effort is normal. No respiratory distress.     Breath sounds: Rhonchi present. No wheezing or rales.     Comments: Mild diffuse interstitial sounds. Chest:     Chest wall: No tenderness.  Abdominal:     General: Bowel sounds are normal. There is no distension.     Palpations: Abdomen is soft.     Comments: Obese, unable to assess HSM  Musculoskeletal:     Cervical back: Normal range of motion and neck supple.     Right lower leg: Edema (2) present.     Left lower leg: Edema (2) present.  Neurological:     General: No focal deficit present.     Mental Status: He is alert and oriented to person, place, and time.  Psychiatric:        Mood and Affect: Mood normal.     Comments: Questionable judgment.  He is not sure what medicines he is actually taking.  He is not weighing himself.  I wonder if he truly understands the severity of his condition.     Adult ECG Report  Rate: 90;  Rhythm: normal sinus rhythm and Likely left atrial enlargement.  Septal infarct, age undetermined.;   Narrative Interpretation: Stable  Recent Labs: Reviewed Lab Results  Component Value Date   CHOL  147  07/20/2020   HDL  31    LDLCALC  102    TRIG  62    Drawn  today Lab Results  Component Value Date   CREATININE  1.10 09/14/2020   BUN  13    NA  143    K 4.1    CL 106    CO2 20     07/28/2020: WBC 10.5, H/H 14.2/42.2.  PLT 327.  Lab Results  Component Value Date   TSH 1.447 07/19/2020    ==================================================  COVID-19 Education: The signs and symptoms of COVID-19 were discussed with the patient and how to seek care for testing (follow up with PCP or arrange E-visit).   The importance of social distancing and COVID-19 vaccination was discussed today. The patient is practicing social distancing & Masking.   I spent a total of with the patient spent in direct patient consultation.  Additional time spent with chart review  / charting (studies, outside notes, etc): 32 min Total Time: 61 min   Current medicines are reviewed at length with the patient today.  (+/- concerns) none  This visit occurred during the SARS-CoV-2 public health emergency.  Safety protocols were in place, including screening questions prior to the visit, additional usage of staff PPE, and extensive cleaning of exam room while observing appropriate contact time as indicated for disinfecting solutions.  Notice: This dictation was prepared with  Dragon dictation along with smaller Lobbyist. Any transcriptional errors that result from this process are unintentional and may not be corrected upon review.  Patient Instructions / Medication Changes & Studies & Tests Ordered   Patient Instructions  Medication Instructions:   increase to Losartan 50 mg twice a day    for one week take 80 mg lasix  Every day ( 2 tablets after the week 80 mg  Every other day and 40 mg the opposite days  *If you need a refill on your cardiac medications before your next appointment, please call your pharmacy*   Lab Work:  cmp bnp If you have labs (blood work) drawn today and your tests are completely normal, you will receive your results only  by: Marland Kitchen MyChart Message (if you have MyChart) OR . A paper copy in the mail If you have any lab test that is abnormal or we need to change your treatment, we will call you to review the results.   Testing/Procedures: Not needed   Follow-Up: At [facility], you and your health needs are our priority.  As part of our continuing mission to provide you with exceptional heart care, we have created designated Provider Care Teams.  These Care Teams include your primary Cardiologist (physician) and Advanced Practice Providers (APPs -  Physician Assistants and Nurse Practitioners) who all work together to provide you with the care you need, when you need it.  We recommend signing up for the patient portal called "MyChart".  Sign up information is provided on this After Visit Summary.  MyChart is used to connect with patients for Virtual Visits (Telemedicine).  Patients are able to view lab/test results, encounter notes, upcoming appointments, etc.  Non-urgent messages can be sent to your provider as well.   To learn more about what you can do with MyChart, go to [email or web address].    Your next appointment:   3 week(s)  The format for your next appointment:   In Person  Provider:   You may see  [name] MD or one of the following Advanced Practice Providers on your designated Care Team:    Theodore Demark, PA-C  Joni Reining, DNP, ANP    Other Instructions You have been referred to social work or pharmacist  to help with medications - starting  Valla Leaver or farxiga     Studies Ordered:   Orders Placed This Encounter  Procedures  . Comprehensive metabolic panel  . Brain natriuretic peptide  . EKG 12-Lead     [name], M.D., M.S. Interventional Cardiologist   Pager # [phone] Phone # [phone] [address] [place], [place] [address]   Thank you for choosing Heartcare at [facility]!!

## 2020-09-14 NOTE — Telephone Encounter (Signed)
Met w/ pt regarding SNAP application. Pt signed paperwork and was provided w/ this writer's business card for any additional questions/concerns.   LCSW securely sent referral to [facility], pt aware they will f/u with him likely next week but to let me know if he doesn't hear from them by Wednesday/Thursday.   Westley Hummer, MSW, [place]  [phone]

## 2020-09-14 NOTE — Patient Instructions (Signed)
Medication Instructions:   increase to Losartan 50 mg twice a day    for one week take 80 mg lasix  Every day ( 2 tablets after the week 80 mg  Every other day and 40 mg the opposite days  *If you need a refill on your cardiac medications before your next appointment, please call your pharmacy*   Lab Work:  cmp bnp If you have labs (blood work) drawn today and your tests are completely normal, you will receive your results only by: Marland Kitchen MyChart Message (if you have MyChart) OR . A paper copy in the mail If you have any lab test that is abnormal or we need to change your treatment, we will call you to review the results.   Testing/Procedures: Not needed   Follow-Up: At [facility], you and your health needs are our priority.  As part of our continuing mission to provide you with exceptional heart care, we have created designated Provider Care Teams.  These Care Teams include your primary Cardiologist (physician) and Advanced Practice Providers (APPs -  Physician Assistants and Nurse Practitioners) who all work together to provide you with the care you need, when you need it.  We recommend signing up for the patient portal called "MyChart".  Sign up information is provided on this After Visit Summary.  MyChart is used to connect with patients for Virtual Visits (Telemedicine).  Patients are able to view lab/test results, encounter notes, upcoming appointments, etc.  Non-urgent messages can be sent to your provider as well.   To learn more about what you can do with MyChart, go to [email or web address].    Your next appointment:   3 week(s)  The format for your next appointment:   In Person  Provider:   You may see  [name] MD or one of the following Advanced Practice Providers on your designated Care Team:    Theodore Demark, PA-C  Joni Reining, DNP, ANP    Other Instructions You have been referred to social work or pharmacist  to help with medications - starting   Valla Leaver or farxiga

## 2020-09-14 NOTE — Patient Instructions (Signed)
Take 1 tablet tonight only and then continue 1 tablet Daily except 0.5 tablet on Monday, Wednesday and Friday.  INR to be done in 1 week.

## 2020-09-15 LAB — COMPREHENSIVE METABOLIC PANEL
ALT: 205 IU/L — ABNORMAL HIGH (ref 0–44)
AST: 82 IU/L — ABNORMAL HIGH (ref 0–40)
Albumin/Globulin Ratio: 1.2 (ref 1.2–2.2)
Albumin: 3.6 g/dL — ABNORMAL LOW (ref 4.0–5.0)
Alkaline Phosphatase: 141 IU/L — ABNORMAL HIGH (ref 44–121)
BUN/Creatinine Ratio: 12 (ref 9–20)
BUN: 13 mg/dL (ref 6–24)
Bilirubin Total: 0.9 mg/dL (ref 0.0–1.2)
CO2: 20 mmol/L (ref 20–29)
Calcium: 9.1 mg/dL (ref 8.7–10.2)
Chloride: 106 mmol/L (ref 96–106)
Creatinine, Ser: 1.1 mg/dL (ref 0.76–1.27)
GFR calc Af Amer: 93 mL/min/{1.73_m2} (ref >59)
GFR calc non Af Amer: 81 mL/min/{1.73_m2} (ref >59)
Globulin, Total: 2.9 g/dL (ref 1.5–4.5)
Glucose: 123 mg/dL — ABNORMAL HIGH (ref 65–99)
Potassium: 4.1 mmol/L (ref 3.5–5.2)
Sodium: 143 mmol/L (ref 134–144)
Total Protein: 6.5 g/dL (ref 6.0–8.5)

## 2020-09-15 LAB — BRAIN NATRIURETIC PEPTIDE: BNP: 2592.3 pg/mL — ABNORMAL HIGH (ref 0.0–100.0)

## 2020-09-17 MED ORDER — GLIPIZIDE 10 MG PO TABS: 10.0000 mg | ORAL_TABLET | Freq: Two times a day (BID) | ORAL | 3 refills | Status: DC

## 2020-09-17 MED ORDER — SITAGLIPTIN PHOSPHATE 25 MG PO TABS
25.0000 mg | ORAL_TABLET | Freq: Every day | ORAL | 3 refills | Status: DC
Start: 2020-09-17 — End: 2021-05-10

## 2020-09-17 MED ORDER — LANTUS SOLOSTAR 100 UNIT/ML ~~LOC~~ SOPN: 10.0000 [IU] | PEN_INJECTOR | Freq: Every day | SUBCUTANEOUS | 99 refills | Status: DC

## 2020-09-18 ENCOUNTER — Ambulatory Visit: Payer: Self-pay | Admitting: Family Medicine

## 2020-09-18 ENCOUNTER — Telehealth: Payer: Self-pay

## 2020-09-18 ENCOUNTER — Other Ambulatory Visit: Payer: Self-pay | Admitting: Family Medicine

## 2020-09-18 NOTE — Telephone Encounter (Signed)
Lantus solosstar requires prior auth.  Would alternative be sufficient or initiate prior auth?  Alternatives include: Insulin lispro, soliqua, zultophy, tresiba, basaglar [employer], levemir flextouch, novolog flexpen, levemir, ozempic, trulicity, victoza, farxiga, toujeo, novolog mix 70/30 flexpen.  [employer] Key [id number] if prior auth needed.  Please advise.

## 2020-09-18 NOTE — Telephone Encounter (Signed)
Sent alternate prescription for Levemir on 09/03/2020

## 2020-09-19 ENCOUNTER — Other Ambulatory Visit: Payer: Self-pay | Admitting: *Deleted

## 2020-09-19 ENCOUNTER — Other Ambulatory Visit: Payer: Self-pay

## 2020-09-19 ENCOUNTER — Ambulatory Visit (INDEPENDENT_AMBULATORY_CARE_PROVIDER_SITE_OTHER): Payer: BC Managed Care – PPO | Admitting: Family Medicine

## 2020-09-19 ENCOUNTER — Encounter: Payer: Self-pay | Admitting: Family Medicine

## 2020-09-19 VITALS — BP 137/95 | HR 94 | Temp 97.7°F | Ht 73.0 in | Wt 293.0 lb

## 2020-09-19 DIAGNOSIS — R0602 Shortness of breath: Secondary | ICD-10-CM

## 2020-09-19 DIAGNOSIS — I4891 Unspecified atrial fibrillation: Secondary | ICD-10-CM

## 2020-09-19 DIAGNOSIS — Z09 Encounter for follow-up examination after completed treatment for conditions other than malignant neoplasm: Secondary | ICD-10-CM

## 2020-09-19 DIAGNOSIS — I10 Essential (primary) hypertension: Secondary | ICD-10-CM

## 2020-09-19 DIAGNOSIS — Z7901 Long term (current) use of anticoagulants: Secondary | ICD-10-CM

## 2020-09-19 DIAGNOSIS — R479 Unspecified speech disturbances: Secondary | ICD-10-CM | POA: Diagnosis not present

## 2020-09-19 DIAGNOSIS — E118 Type 2 diabetes mellitus with unspecified complications: Secondary | ICD-10-CM

## 2020-09-19 DIAGNOSIS — J302 Other seasonal allergic rhinitis: Secondary | ICD-10-CM

## 2020-09-19 DIAGNOSIS — Z8673 Personal history of transient ischemic attack (TIA), and cerebral infarction without residual deficits: Secondary | ICD-10-CM | POA: Diagnosis not present

## 2020-09-19 DIAGNOSIS — L299 Pruritus, unspecified: Secondary | ICD-10-CM

## 2020-09-19 DIAGNOSIS — R739 Hyperglycemia, unspecified: Secondary | ICD-10-CM

## 2020-09-19 DIAGNOSIS — R7309 Other abnormal glucose: Secondary | ICD-10-CM

## 2020-09-19 MED ORDER — ALBUTEROL SULFATE HFA 108 (90 BASE) MCG/ACT IN AERS
2.0000 | INHALATION_SPRAY | Freq: Four times a day (QID) | RESPIRATORY_TRACT | 11 refills | Status: DC | PRN
Start: 2020-09-19 — End: 2021-05-10

## 2020-09-19 MED ORDER — HYDROCORTISONE 1 % EX CREA: 1.0000 "application " | TOPICAL_CREAM | Freq: Two times a day (BID) | CUTANEOUS | 6 refills | Status: DC

## 2020-09-19 MED ORDER — BLOOD GLUCOSE METER KIT: PACK | 0 refills | Status: DC

## 2020-09-19 MED ORDER — INSULIN DETEMIR 100 UNIT/ML ~~LOC~~ SOLN: 10.0000 [IU] | Freq: Every day | SUBCUTANEOUS | 11 refills | Status: DC

## 2020-09-19 MED ORDER — CETIRIZINE HCL 10 MG PO TABS: 10.0000 mg | ORAL_TABLET | Freq: Every day | ORAL | 12 refills | Status: DC

## 2020-09-19 MED ORDER — HYDROXYZINE HCL 10 MG PO TABS: 10.0000 mg | ORAL_TABLET | Freq: Three times a day (TID) | ORAL | 11 refills | Status: DC | PRN

## 2020-09-19 NOTE — Patient Instructions (Addendum)
Vitamin K Foods and Warfarin Warfarin is a blood thinner (anticoagulant). Anticoagulant medicines help prevent the formation of blood clots. Warfarin works by blocking the activity of vitamin K, which promotes normal blood clotting. When you take warfarin, problems can occur from suddenly increasing or decreasing the amount of vitamin K that you eat from one day to the next. These problems can occur due to varying levels of warfarin in your blood. Problems may include:  Blood clots.  Bleeding. What are tips for eating the right amount of vitamin K? To avoid problems when taking warfarin:  Eat a balanced diet that includes: ? Fresh fruits and vegetables. ? Whole grains. ? Low-fat dairy products. ? Lean proteins, such as fish, eggs, and lean cuts of meat.  Keep your intake of vitamin K consistent from day to day. To do this: ? Avoid eating large amounts of vitamin K one day and low amounts of vitamin K the next day. ? If you take a multivitamin that contains vitamin K, be sure to take it every day. ? Know which foods contain vitamin K. Read food labels. Use the lists below to understand serving sizes and the amount of vitamin K in one serving.  Avoid major changes in your diet. If you are going to change your diet, talk with your health care provider before making changes.  Work with a Financial planner (dietitian) to develop a meal plan that works best for you.   What foods are high in vitamin K? Foods that are high in vitamin K contain more than 100 mcg (micrograms) per serving. These include:  Broccoli (cooked from fresh) -  cup (78 g) has 110 mcg.  Brussels sprouts (cooked from fresh) -  cup (78 g) has 109 mcg.  Greens, beet (cooked from fresh) -  cup (72 g) has 350 mcg.  Greens, collard (cooked from fresh) -  cup (66 g) has 263 mcg.  Greens, turnip (cooked from fresh) -  cup (72 g) has 265 mcg.  Green onions or scallions -  cup (50 g) has 105 mcg.  Kale (cooked from  fresh) -  cup (68 g) has 536 mcg.  Parsley (raw) - 10 sprigs (10 g) has 164 mcg.  Spinach (cooked from fresh) -  cup (90 g) has 444 mcg.  Swiss chard (cooked from fresh) -  cup (88 g) has 287 mcg.   What foods have a moderate amount of vitamin K? Foods that have a moderate amount of vitamin K contain 25-100 mcg per serving. These include:  Asparagus (cooked from fresh) - 4 spears (60 g) have 30 mcg.  Black-eyed peas (dried) -  cup (85 g) has 32 mcg.  Cabbage (cooked from fresh) -  cup (78 g) has 84 mcg.  Cabbage (raw) -  cup (35 g) has 26 mcg.  Kiwi fruit - 1 medium (69 g) has 27 mcg.  Lettuce (raw) - 1 cup (36 g) has 45 mcg.  Okra (cooked from fresh) -  cup (80 g) has 32 mcg.  Prunes (dried) - 5 prunes (47 g) have 25 mcg.  Watercress (raw) - 1 cup (34 g) has 85 mcg. What foods are low in vitamin K? Foods low in vitamin K contain less than 25 mcg per serving. These include:  Artichoke - 1 medium (128 g) has 18 mcg.  Avocado - 1 oz (21 g) has 6 mcg.  Blueberries -  cup (73 g) has 14 mcg.  Carrots (cooked from fresh) -  cup (  78 g) has 11 mcg.  Cauliflower (raw) -  cup (54 g) has 8 mcg.  Cucumber with peel (raw) -  cup (52 g) has 9 mcg.  Grapes -  cup (76 g) has 12 mcg.  Mango - 1 medium (207 g) has 9 mcg.  Mixed nuts - 1 cup (142 g) has 17 mcg.  Pear - 1 medium (178 g) has 8 mcg.  Peas (cooked from fresh) -  cup (80 g) has 20 mcg.  Pickled cucumber - 1 spear (65 g) has 11 mcg.  Sauerkraut (canned) -  cup (118 g) has 16 mcg.  Soybeans (cooked from fresh) -  cup (86 g) has 16 mcg.  Tomato (raw) - 1 medium (123 g) has 10 mcg.  Tomato sauce (raw) -  cup (123 g) has 17 mcg.   What foods do not have vitamin K? If a food contains less than 5 mcg per serving, it is considered to have no vitamin K. These foods include:  Bread and cereal products.  Cheese.  Eggs.  Fish and shellfish.  Meat and poultry.  Milk and dairy products.  Seeds,  such as sunflower or pumpkin seeds. The items listed above may not be a complete list of foods that have high, moderate, and low amounts of vitamin K or do not have vitamin K. Actual amounts of vitamin K may differ depending on processing. Contact a dietitian for more information. Summary  Warfarin is an anticoagulant that prevents blood clots by blocking the activity of vitamin K. It is important to monitor the content of vitamin K in your foods and to be consistent with the amount of vitamin K you take in each day.  Avoid major changes in your diet. If you are going to change your diet, talk with your health care provider before making changes.  Pruritus Pruritus is an itchy feeling on the skin. One of the most common causes is dry skin, but many different things can cause itching. Most cases of itching do not require medical attention. Sometimes itchy skin can turn into a rash. Follow these instructions at home: Skin care  Apply moisturizing lotion to your skin as needed. Lotion that contains petroleum jelly is best.  Take medicines or apply medicated creams only as told by your health care provider. This may include: ? Corticosteroid cream. ? Anti-itch lotions. ? Oral antihistamines.  Apply a cool, wet cloth (cool compress) to the affected areas.  Take baths with one of the following: ? Epsom salts. You can get these at your local pharmacy or grocery store. Follow the instructions on the packaging. ? Baking soda. Pour a small amount into the bath as told by your health care provider. ? Colloidal oatmeal. You can get this at your local pharmacy or grocery store. Follow the instructions on the packaging.  Apply baking soda paste to your skin. To make the paste, stir water into a small amount of baking soda until it reaches a paste-like consistency.  Do not scratch your skin.  Do not take hot showers or baths, which can make itching worse. A cool shower may help with itching as long  as you apply moisturizing lotion after the shower.  Do not use scented soaps, detergents, perfumes, and cosmetic products. Instead, use gentle, unscented versions of these items.   General instructions  Avoid wearing tight clothes.  Keep a journal to help find out what is causing your itching. Write down: ? What you eat and drink. ? What  cosmetic products you use. ? What soaps or detergents you use. ? What you wear, including jewelry.  Use a humidifier. This keeps the air moist, which helps to prevent dry skin.  Be aware of any changes in your itchiness. Contact a health care provider if:  The itching does not go away after several days.  You are unusually thirsty or urinating more than normal.  Your skin tingles or feels numb.  Your skin or the white parts of your eyes turn yellow (jaundice).  You feel weak.  You have any of the following: ? Night sweats. ? Tiredness (fatigue). ? Weight loss. ? Abdominal pain. Summary  Pruritus is an itchy feeling on the skin. One of the most common causes is dry skin, but many different conditions and factors can cause itching.  Apply moisturizing lotion to your skin as needed. Lotion that contains petroleum jelly is best.  Take medicines or apply medicated creams only as told by your health care provider.  Do not take hot showers or baths. Do not use scented soaps, detergents, perfumes, or cosmetic products. This information is not intended to replace advice given to you by your health care provider. Make sure you discuss any questions you have with your health care provider. Document Revised: 07/21/2017 Document Reviewed: 07/21/2017 Elsevier Patient Education  2021 [place]. Hydrocortisone Cream, Lotion, Ointment, or Solution What is this medicine? HYDROCORTISONE (hye droe KOR ti sone) is a steroid. It is used on the skin to reduce swelling, redness, itching, and allergic reactions. This medicine may be used for other  purposes; ask your health care provider or pharmacist if you have questions. COMMON BRAND NAME(S): Ala-Cort, Ala-Scalp, Anusol HC, Aqua Glycolic HC, Balneol for Her, Caldecort, Cetacort, Cortaid, Cortaid Advanced, Cortaid Intensive Therapy, Cortaid Sensitive Skin, CortAlo, Corticaine, Corticool, Cortizone, Cortizone-10, Cortizone-10 Cooling Relief, Cortizone-10 Intensive Healing, Cortizone-10 Plus, Dermarest Dricort, Dermarest Eczema, DERMASORB HC Complete, Gly-Cort, Hycort, Hydro Skin, Hydrocortisone in Absorbase, Hydroskin, Hytone, Instacort, Lacticare HC, Locoid, Locoid Lipocream, MiCort-HC, Monistat Complete Care Instant Itch Relief Cream, Neosporin Eczema, NuCort, Nutracort, NuZon, Pandel, Pediaderm HC, Penecort, Preparation H Hydrocortisone, Procto-Kit, Procto-Med HC, Procto-Pak, Proctocort, Proctocream-HC, Proctosol-HC, Proctozone-HC, Rederm, Sarnol-HC, Nurse, adult, Engineer, site, [place], Tucks HC, Vagisil Anti-Itch, [facility] Intensive Healing, Human resources officer What should I tell my health care provider before I take this medicine? They need to know if you have any of these conditions:  large areas of burned or damaged skin  skin infection  taking steroids such as dexamethasone or prednisone  using steroid cream, lotions, or inhalers  an unusual or allergic reaction to hydrocortisone, steroids, other medicines, foods, dyes, or preservatives  pregnant or trying to get pregnant  breast-feeding How should I use this medicine? This medicine is for external use only. Do not take by mouth. Follow the directions on the prescription label. Wash your hands before and after use. Apply a thin film of medicine to the affected area. Do not cover with a bandage or dressing unless your doctor or health care professional tells you to. Do not use on healthy skin or over large areas of skin. Do not get this medicine in your eyes. If you do, rinse out with plenty of cool tap water. Do not to use more medicine  than prescribed. Do not use your medicine more often than directed or for more than 14 days. Talk to your pediatrician regarding the use of this medicine in children. Special care may be needed. While this drug may be prescribed for children as young as  3 years of age for selected conditions, precautions do apply. Do not use this medicine for the treatment of diaper rash unless directed to do so by your doctor or health care professional. If applying this medicine to the diaper area of a child, do not cover with tight-fitting diapers or plastic pants. This may increase the amount of medicine that passes through the skin and increase the risk of serious side effects. Elderly patients are more likely to have damaged skin through aging, and this may increase side effects. This medicine should only be used for brief periods and infrequently in older patients. Overdosage: If you think you have taken too much of this medicine contact a poison control center or emergency room at once. NOTE: This medicine is only for you. Do not share this medicine with others. What if I miss a dose? If you miss a dose, use it as soon as you can. If it is almost time for your next dose, use only that dose. Do not use double or extra doses. What may interact with this medicine? Interactions are not expected. Do not use any other skin products on the affected area without asking your doctor or health care professional. This list may not describe all possible interactions. Give your health care provider a list of all the medicines, herbs, non-prescription drugs, or dietary supplements you use. Also tell them if you smoke, drink alcohol, or use illegal drugs. Some items may interact with your medicine. What should I watch for while using this medicine? Tell your doctor or health care professional if your symptoms do not start to get better within 7 days or if they get worse. Tell your doctor or health care professional if you are  exposed to anyone with measles or chickenpox, or if you develop sores or blisters that do not heal properly. What side effects may I notice from receiving this medicine? Side effects that you should report to your doctor or health care professional as soon as possible:  allergic reactions like skin rash, itching or hives, swelling of the face, lips, or tongue  burning feeling on the skin  dark red spots on the skin  infection  lack of healing of skin condition  painful, red, pus filled blisters in hair follicles  thinning of the skin Side effects that usually do not require medical attention (report to your doctor or health care professional if they continue or are bothersome):  dry skin, irritation  unusual increased growth of hair on the face or body This list may not describe all possible side effects. Call your doctor for medical advice about side effects. You may report side effects to FDA at 1-800-FDA-1088. Where should I keep my medicine? Keep out of the reach of children. Store at room temperature between 15 and 30 degrees C (59 and 86 degrees F). Do not freeze. Throw away any unused medicine after the expiration date. NOTE: This sheet is a summary. It may not cover all possible information. If you have questions about this medicine, talk to your doctor, pharmacist, or health care provider.  2021 Elsevier/Gold Standard (2020-05-17 11:34:23) Hydroxyzine capsules or tablets What is this medicine? HYDROXYZINE (hye DROX i zeen) is an antihistamine. This medicine is used to treat allergy symptoms. It is also used to treat anxiety and tension. This medicine can be used with other medicines to induce sleep before surgery. This medicine may be used for other purposes; ask your health care provider or pharmacist if you have questions.  COMMON BRAND NAME(S): ANX, Atarax, Rezine, Vistaril What should I tell my health care provider before I take this medicine? They need to know if you  have any of these conditions:  glaucoma  heart disease  history of irregular heartbeat  kidney disease  liver disease  lung or breathing disease, like asthma  stomach or intestine problems  thyroid disease  trouble passing urine  an unusual or allergic reaction to hydroxyzine, cetirizine, other medicines, foods, dyes or preservatives  pregnant or trying to get pregnant  breast-feeding How should I use this medicine? Take this medicine by mouth with a full glass of water. Follow the directions on the prescription label. You may take this medicine with food or on an empty stomach. Take your medicine at regular intervals. Do not take your medicine more often than directed. Talk to your pediatrician regarding the use of this medicine in children. Special care may be needed. While this drug may be prescribed for children as young as 4 years of age for selected conditions, precautions do apply. Patients over 27 years old may have a stronger reaction and need a smaller dose. Overdosage: If you think you have taken too much of this medicine contact a poison control center or emergency room at once. NOTE: This medicine is only for you. Do not share this medicine with others. What if I miss a dose? If you miss a dose, take it as soon as you can. If it is almost time for your next dose, take only that dose. Do not take double or extra doses. What may interact with this medicine? Do not take this medicine with any of the following medications:  cisapride  dronedarone  pimozide  thioridazine This medicine may also interact with the following medications:  alcohol  antihistamines for allergy, cough, and cold  atropine  barbiturate medicines for sleep or seizures, like phenobarbital  certain antibiotics like erythromycin or clarithromycin  certain medicines for anxiety or sleep  certain medicines for bladder problems like oxybutynin, tolterodine  certain medicines for  depression or psychotic disturbances  certain medicines for irregular heart beat  certain medicines for Parkinson's disease like benztropine, trihexyphenidyl  certain medicines for seizures like phenobarbital, primidone  certain medicines for stomach problems like dicyclomine, hyoscyamine  certain medicines for travel sickness like scopolamine  ipratropium  narcotic medicines for pain  other medicines that prolong the QT interval (an abnormal heart rhythm) like dofetilide This list may not describe all possible interactions. Give your health care provider a list of all the medicines, herbs, non-prescription drugs, or dietary supplements you use. Also tell them if you smoke, drink alcohol, or use illegal drugs. Some items may interact with your medicine. What should I watch for while using this medicine? Tell your doctor or health care professional if your symptoms do not improve. You may get drowsy or dizzy. Do not drive, use machinery, or do anything that needs mental alertness until you know how this medicine affects you. Do not stand or sit up quickly, especially if you are an older patient. This reduces the risk of dizzy or fainting spells. Alcohol may interfere with the effect of this medicine. Avoid alcoholic drinks. Your mouth may get dry. Chewing sugarless gum or sucking hard candy, and drinking plenty of water may help. Contact your doctor if the problem does not go away or is severe. This medicine may cause dry eyes and blurred vision. If you wear contact lenses you may feel some discomfort. Lubricating drops may  help. See your eye doctor if the problem does not go away or is severe. If you are receiving skin tests for allergies, tell your doctor you are using this medicine. What side effects may I notice from receiving this medicine? Side effects that you should report to your doctor or health care professional as soon as possible:  allergic reactions like skin rash, itching or  hives, swelling of the face, lips, or tongue  changes in vision  confusion  fast, irregular heartbeat  seizures  tremor  trouble passing urine or change in the amount of urine Side effects that usually do not require medical attention (report to your doctor or health care professional if they continue or are bothersome):  constipation  drowsiness  dry mouth  headache  tiredness This list may not describe all possible side effects. Call your doctor for medical advice about side effects. You may report side effects to FDA at 1-800-FDA-1088. Where should I keep my medicine? Keep out of the reach of children. Store at room temperature between 15 and 30 degrees C (59 and 86 degrees F). Keep container tightly closed. Throw away any unused medicine after the expiration date. NOTE: This sheet is a summary. It may not cover all possible information. If you have questions about this medicine, talk to your doctor, pharmacist, or health care provider.  2021 Elsevier/Gold Standard (2018-06-28 13:19:55) This information is not intended to replace advice given to you by your health care provider. Make sure you discuss any questions you have with your health care provider. Document Revised: 04/26/2019 Document Reviewed: 04/26/2019 Elsevier Patient Education  [place].

## 2020-09-19 NOTE — Patient Outreach (Signed)
[facility]) Care Management  09/19/2020  Rodney Kane 05-26-1976 [id number]   Accompanied member to MD visit, member denies any chest discomfort or paint.  Does report intermittent shortness of breath and itching since having Covid.  New prescriptions for Albuterol, Hydrocortisone, and Atarax given.  He continues to have aphasia and stuttering, referral for outpatient speech therapy placed.  He admits he has not been consistent with monitoring blood pressure, but will start daily monitoring and recording.  Today's vitals - 293 pounds, temp 97.7, 152/108 (left arm) -  137/95 (right arm, HR 92, O2 sats 98%.  Confirms that he still does not have a glucose meter, prescription sent to pharmacy.  Member admits that he still does not have any insulin or Januvia, but report taking all other medications.    Call placed to pharmacy to follow up on cost of all medications.  Notified that member has a total of 10 items available for a total cost of $244.  This includes all medications prescribed today and glucose meter.  Januvia alone cost $100, Novolog is $84.  There is a prescription for Lantus, waiting for prior authorization.  They do not have prescription for Levemir, will notify PCP as this was to replace the Lantus due to no cost.  Januvia was also to replace Tradjenta, insurance does not cover.  Member notified of total cost, he is unable to pay the cost despite medications being changed to cheaper meds.  He will try to get as much as he can and will get the others as his finances will allow.  Encouraged to obtain glucose meter (cost $24) especially of he is not able to get Januvia and insulin.  Denies any urgent concerns, encouraged to contact this care manager with questions.  Agrees to follow up within the next 3 weeks.  Goals Addressed            This Visit's Progress   . [facility] - Find Help in My Community   On track    Timeframe:  Short-Term Goal Priority:  High Start  Date:       3/2                 Expected End Date:       4/2  Follow Up Date 3/22   - follow-up on any referrals for help I am given - make a list of family or friends that I can call    Why is this important?    Knowing how and where to find help for yourself or family in your neighborhood and community is an important skill.   You will want to take some steps to learn how.    Notes:   1/28 - Confirmed member has been in contact with [facility] pharmacist  1/20 - Referral placed to [facility] - not eligible due to age  2/17 - Call placed to PCP office to request referral to outpatient speech therapy  3/2 - accompanied member to PCP office, referral received for ST    . [facility] - Make and Keep All Appointments   On track    Timeframe:  Short-Term Goal Priority:  Medium Start Date:          3/2           Expected End Date:       4/2                Follow Up Date 3/22   - ask  family or friend for a ride - keep a calendar with appointment dates    Why is this important?    Part of staying healthy is seeing the doctor for follow-up care.   If you forget your appointments, there are some things you can do to stay on track.    Notes:   1/28 - Call placed to reschedule cardiology visit  2/17 - Upcoming appointments reviewed with member  3/2 - Reviewed upcoming appointments, assessed need for transportation resources    . [facility] - Track and Manage My Blood Pressure-Hypertension   On track    Timeframe:  Long-Range Goal Priority:  Medium Start Date:         08/09/2020                    Expected End Date:  10/07/2020                     Follow Up Date 4/4   - check blood pressure daily - write blood pressure results in a log or diary    Why is this important?    You won't feel high blood pressure, but it can still hurt your blood vessels.   High blood pressure can cause heart or kidney problems. It can also cause a stroke.   Making lifestyle changes like losing a little  weight or eating less salt will help.   Checking your blood pressure at home and at different times of the day can help to control blood pressure.   If the doctor prescribes medicine remember to take it the way the doctor ordered.   Call the office if you cannot afford the medicine or if there are questions about it.     Notes:   1/28 - Reminded to monitor blood pressure and record daily  2/17 - Confirmed member has BP monitor, will send scale for daily weights  3/2 - Reviewed medication changes, reminded to monitor and record      [name], Charity fundraiser, MSN [facility] Care Management  [facility] Manager [phone]

## 2020-09-19 NOTE — Progress Notes (Signed)
Patient [facility]   Established Patient Office Visit  Subjective:  Patient ID: Rodney Kane, male    DOB: [birth year]  Age: 45 y.o. MRN: [id number]  CC:  Chief Complaint  Patient presents with  . Follow-up  . Asthma    Follow up , having swelling in both legs and feet and breathing, pt is  elevating legs and feet , stay the same with elevating, pt can't afford out patient speech therapy and wants to if can do  home  speech therapy if would cheaper for him.    HPI Rodney Kane is a 45 year old male who presents for Follow Up today.    Patient Active Problem List   Diagnosis Date Noted  . Atrial fibrillation ([place]) 08/13/2020  . Acute venous embolism and thrombosis of deep vessels of proximal lower extremity ([place]) 08/13/2020  . Long term (current) use of anticoagulants 08/13/2020  . 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 Prolonged QT interval 07/25/2020  . Sinus tachycardia 07/20/2020  . Acute CVA (cerebrovascular accident) ([place]) 07/20/2020  . COVID-19 virus infection 07/19/2020  . Noncompliance with medication regimen 01/04/2020  . Hyperglycemia 01/04/2020  . Urine ketones 01/04/2020  . Essential hypertension 01/04/2020  . NICM (nonischemic cardiomyopathy) ([place]) 01/16/2017  . 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 Acute combined systolic and diastolic heart failure ([place])   . Elevated troponin   . 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 Diabetes ([place]) 01/02/2017   Current Status: Since his last office visit, he is doing well with no complaints.  He continues to follow up with Cardiology as needed. He states that his home blood pressures are usually 140's/98-114's. He denies visual changes, chest pain, cough, shortness of breath, heart palpitations, and falls. He has occasional headaches and dizziness with position changes. Denies severe headaches, confusion, seizures, double vision, and blurred  vision, nausea and vomiting. He has not been monitoring his blood glucose levels regularly lately. He denies fatigue, frequent urination, blurred vision, excessive hunger, excessive thirst, weight gain, weight loss, and poor wound healing. He continues to check his feet regularly. He denies fevers, chills, fatigue, recent infections, weight loss, and night sweats.  Denies GI problems such as diarrhea, and constipation. He has no reports of blood in stools, dysuria and hematuria. No depression or anxiety reported today. He is taking all medications as prescribed. He denies pain today.   Past Medical History:  Diagnosis Date  . Coronavirus infection 06/2020  . COVID 06/2020  . Diabetes mellitus without complication ([place])   . Hemoglobin A1C greater than 9%, indicating poor diabetic control 12/2019  . Hyperglycemia 12/2019  . Hyperlipidemia 12/2019  . Hypertension   . Hypertensive urgency 12/2019  . Noncompliance with medication regimen 12/2019  . Stroke ([place])   . Urine ketones 12/2019  . Vitamin D deficiency 12/2019    Past Surgical History:  Procedure Laterality Date  . left knee surgery    . RIGHT/LEFT HEART CATH AND CORONARY ANGIOGRAPHY N/A 01/05/2017   Procedure: Right/Left Heart Cath and Coronary Angiography;  Surgeon: Leonie Man, MD;  Location: [facility];  Service: Cardiovascular;  Laterality: N/A;    Family History  Problem Relation Age of Onset  . Multiple sclerosis Mother   . Diabetic kidney disease Mother   . Hypertension Mother     Social History   Socioeconomic History  . Marital status: Divorced    Spouse name: Not on file  .  Number of children: 3  . Years of education: Not on file  . Highest education level: Not on file  Occupational History  . Not on file  Tobacco Use  . Smoking status: Never Smoker  . Smokeless tobacco: Never Used  Vaping Use  . Vaping Use: Never used  Substance and Sexual Activity  . Alcohol use: No  . Drug use: No  .  Sexual activity: Yes  Other Topics Concern  . Not on file  Social History Narrative  . Not on file   Social Determinants of Health   Financial Resource Strain: Not on file  Food Insecurity: No Food Insecurity  . Worried About Charity fundraiser in the Last Year: Never true  . Ran Out of Food in the Last Year: Never true  Transportation Needs: No Transportation Needs  . Lack of Transportation (Medical): No  . Lack of Transportation (Non-Medical): No  Physical Activity: Inactive  . Days of Exercise per Week: 0 days  . Minutes of Exercise per Session: 0 min  Stress: Stress Concern Present  . Feeling of Stress : To some extent  Social Connections: Not on file  Intimate Partner Violence: Not on file    Outpatient Medications Prior to Visit  Medication Sig Dispense Refill  . atorvastatin (LIPITOR) 10 MG tablet Take 1 tablet (10 mg total) by mouth daily. 90 tablet 3  . carvedilol (COREG) 12.5 MG tablet Take 1 tablet (12.5 mg total) by mouth 2 (two) times daily with a meal. 180 tablet 3  . furosemide (LASIX) 40 MG tablet Take 80 mg  ( 2 tablets of 40 mg) every other day with 40 mg (1 tablet) the opposite day 180 tablet 2  . glipiZIDE (GLUCOTROL) 10 MG tablet Take 1 tablet (10 mg total) by mouth 2 (two) times daily before a meal. 180 tablet 3  . losartan (COZAAR) 50 MG tablet Take 1 tablet (50 mg total) by mouth 2 (two) times daily. 180 tablet 2  . polyethylene glycol powder (GLYCOLAX/MIRALAX) 17 GM/SCOOP powder Take 17 g by mouth daily. 3350 g 1  . potassium chloride SA (KLOR-CON) 20 MEQ tablet Take 1 tablet (20 mEq total) by mouth daily. 90 tablet 3  . Vitamin D, Ergocalciferol, (DRISDOL) 1.25 MG (50000 UNIT) CAPS capsule Take 1 capsule (50,000 Units total) by mouth every 7 (seven) days. (Patient taking differently: Take 50,000 Units by mouth every Saturday.) 5 capsule 5  . warfarin (COUMADIN) 5 MG tablet Take 1 tablet (5 mg total) by mouth daily at 4 PM. 30 tablet 1  . insulin aspart  (NOVOLOG) 100 UNIT/ML injection Inject 5 Units into the skin 3 (three) times daily with meals. (Patient not taking: Reported on 09/19/2020) 30 mL 3  . insulin detemir (LEVEMIR) 100 UNIT/ML injection Inject 0.1 mLs (10 Units total) into the skin daily. (Patient not taking: Reported on 09/19/2020) 10 mL 11  . sitaGLIPtin (JANUVIA) 25 MG tablet Take 1 tablet (25 mg total) by mouth daily. (Patient not taking: Reported on 09/19/2020) 90 tablet 3  . spironolactone (ALDACTONE) 25 MG tablet Take 1 tablet (25 mg total) by mouth daily. (Patient not taking: Reported on 09/19/2020) 90 tablet 3  . zinc sulfate 220 (50 Zn) MG capsule Take 1 capsule (220 mg total) by mouth daily. (Patient not taking: Reported on 09/19/2020)    . blood glucose meter kit and supplies Dispense based on patient and insurance preference. Use up to four times daily for blood glucose readings greater than 125. (  Patient not taking: Reported on 09/19/2020) 1 each 0   No facility-administered medications prior to visit.    No Known Allergies  ROS Review of Systems  Constitutional: Negative.   HENT: Negative.   Eyes: Negative.   Respiratory: Positive for cough (occasional ) and shortness of breath (occasional ).   Cardiovascular: Negative.   Gastrointestinal: Positive for abdominal distention (obese).  Endocrine: Negative.   Genitourinary: Negative.   Musculoskeletal: Negative.   Allergic/Immunologic: Negative.   Neurological: Positive for dizziness (occasional ) and headaches (occasional ).  Hematological: Negative.   Psychiatric/Behavioral: Negative.       Objective:    Physical Exam Vitals and nursing note reviewed.  Constitutional:      Appearance: Normal appearance. He is obese.  HENT:     Head: Normocephalic and atraumatic.     Nose: Nose normal.     Mouth/Throat:     Mouth: Mucous membranes are moist.     Pharynx: Oropharynx is clear.  Cardiovascular:     Rate and Rhythm: Normal rate and regular rhythm.     Pulses:  Normal pulses.     Heart sounds: Normal heart sounds.  Pulmonary:     Effort: Pulmonary effort is normal.     Breath sounds: Normal breath sounds.  Abdominal:     General: Bowel sounds are normal. There is distension (occasional ).     Palpations: Abdomen is soft.  Musculoskeletal:        General: Normal range of motion.     Cervical back: Normal range of motion and neck supple.  Skin:    General: Skin is warm and dry.  Neurological:     General: No focal deficit present.     Mental Status: He is alert and oriented to person, place, and time.  Psychiatric:        Mood and Affect: Mood normal.        Behavior: Behavior normal.        Thought Content: Thought content normal.        Judgment: Judgment normal.     BP (!) 137/95 (BP Location: Right Arm)   Pulse 94   Temp 97.7 F (36.5 C) (Temporal)   Ht _0  (1.854 m)   Wt 293 lb (132.9 kg)   SpO2 99%   BMI 38.66 kg/m  Wt Readings from Last 3 Encounters:  09/19/20 293 lb (132.9 kg)  09/14/20 299 lb 3.2 oz (135.7 kg)  09/05/20 (!) 301 lb 6.4 oz (136.7 kg)     Health Maintenance Due  Topic Date Due  . Hepatitis C Screening  Never done  . PNEUMOCOCCAL POLYSACCHARIDE VACCINE AGE 98-64 HIGH RISK  Never done  . COVID-19 Vaccine (1) Never done  . OPHTHALMOLOGY EXAM  Never done  . FOOT EXAM  06/30/2019  . INFLUENZA VACCINE  Never done  . COLONOSCOPY (Pts 45-20yr Insurance coverage will need to be confirmed)  Never done    There are no preventive care reminders to display for this patient.  Lab Results  Component Value Date   TSH 1.447 07/19/2020   Lab Results  Component Value Date   WBC 10.5 07/28/2020   HGB 14.2 07/28/2020   HCT 42.2 07/28/2020   MCV 77.7 (L) 07/28/2020   PLT 327 07/28/2020   Lab Results  Component Value Date   NA 143 09/14/2020   K 4.1 09/14/2020   CO2 20 09/14/2020   GLUCOSE 123 (H) 09/14/2020   BUN 13 09/14/2020   CREATININE  1.10 09/14/2020   BILITOT 0.9 09/14/2020   ALKPHOS 141 (H)  09/14/2020   AST 82 (H) 09/14/2020   ALT 205 (H) 09/14/2020   PROT 6.5 09/14/2020   ALBUMIN 3.6 (L) 09/14/2020   CALCIUM 9.1 09/14/2020   ANIONGAP 10 07/25/2020   Lab Results  Component Value Date   CHOL 145 07/20/2020   Lab Results  Component Value Date   HDL 31 (L) 07/20/2020   Lab Results  Component Value Date   LDLCALC 102 (H) 07/20/2020   Lab Results  Component Value Date   TRIG 62 07/20/2020   Lab Results  Component Value Date   CHOLHDL 4.7 07/20/2020   Lab Results  Component Value Date   HGBA1C 7.7 (H) 07/20/2020    Assessment & Plan:   1. History of CVA (cerebrovascular accident) Stable. No signs or symptoms of recurrence noted or reported. Monitor.  - Ambulatory referral to Speech Therapy  2. Atrial fibrillation, unspecified type [facility]) He will continue Coumadin therapy and follow ups with Cardiology.   3. Speech impediment - Ambulatory referral to Speech Therapy  4. Hypertension, unspecified type He will continue to take medications as prescribed, to decrease high sodium intake, excessive alcohol intake, increase potassium intake, smoking cessation, and increase physical activity of at least 30 minutes of cardio activity daily. He will continue to follow Heart Healthy or DASH diet.  5. Long term (current) use of anticoagulants  6. Type 2 diabetes mellitus with complication, without long-term current use of insulin ([place]) He will continue medication as prescribed, to decrease foods/beverages high in sugars and carbs and follow Heart Healthy or DASH diet. Increase physical activity to at least 30 minutes cardio exercise daily.  - blood glucose meter kit and supplies; Dispense based on patient and insurance preference. Use up to four times daily for blood glucose readings greater than 125.  Dispense: 1 each; Refill: 0  7. Hyperglycemia  8. Hemoglobin A1c between 7.0% and 9.0% Hgb A1c is stable at 7.2. Monitor.   9. Itchy skin - hydrOXYzine  (ATARAX/VISTARIL) 10 MG tablet; Take 1 tablet (10 mg total) by mouth 3 (three) times daily as needed.  Dispense: 90 tablet; Refill: 11 - hydrocortisone cream 1 %; Apply 1 application topically 2 (two) times daily.  Dispense: 30 g; Refill: 6  10. Seasonal allergies - cetirizine (ZYRTEC ALLERGY) 10 MG tablet; Take 1 tablet (10 mg total) by mouth daily.  Dispense: 30 tablet; Refill: 12  11. Shortness of breath Stable. No signs or symptoms of respiratory distress noted or reported today.  - albuterol (VENTOLIN HFA) 108 (90 Base) MCG/ACT inhaler; Inhale 2 puffs into the lungs every 6 (six) hours as needed for wheezing or shortness of breath.  Dispense: 8 g; Refill: 11  12. Follow up He will follow up in 6 months .  Meds ordered this encounter  Medications  . hydrOXYzine (ATARAX/VISTARIL) 10 MG tablet    Sig: Take 1 tablet (10 mg total) by mouth 3 (three) times daily as needed.    Dispense:  90 tablet    Refill:  11  . hydrocortisone cream 1 %    Sig: Apply 1 application topically 2 (two) times daily.    Dispense:  30 g    Refill:  6  . blood glucose meter kit and supplies    Sig: Dispense based on patient and insurance preference. Use up to four times daily for blood glucose readings greater than 125.    Dispense:  1 each  Refill:  0    Order Specific Question:   Number of strips    Answer:   100    Order Specific Question:   Number of lancets    Answer:   100  . cetirizine (ZYRTEC ALLERGY) 10 MG tablet    Sig: Take 1 tablet (10 mg total) by mouth daily.    Dispense:  30 tablet    Refill:  12  . albuterol (VENTOLIN HFA) 108 (90 Base) MCG/ACT inhaler    Sig: Inhale 2 puffs into the lungs every 6 (six) hours as needed for wheezing or shortness of breath.    Dispense:  8 g    Refill:  11    Orders Placed This Encounter  Procedures  . Ambulatory referral to Speech Therapy     .   Problem List Items Addressed This Visit      Cardiovascular and Mediastinum   Atrial  fibrillation ([place]) - Primary     Other   Hyperglycemia   Long term (current) use of anticoagulants    Other Visit Diagnoses    History of CVA (cerebrovascular accident)       Relevant Orders   Ambulatory referral to Speech Therapy   Speech impediment       Relevant Orders   Ambulatory referral to Speech Therapy   Hypertension, unspecified type       Type 2 diabetes mellitus with complication, without long-term current use of insulin (HCC)       Relevant Medications   blood glucose meter kit and supplies   Hemoglobin A1c between 7.0% and 9.0%       Itchy skin       Relevant Medications   hydrOXYzine (ATARAX/VISTARIL) 10 MG tablet   hydrocortisone cream 1 %   Seasonal allergies       Relevant Medications   cetirizine (ZYRTEC ALLERGY) 10 MG tablet   Shortness of breath       Relevant Medications   albuterol (VENTOLIN HFA) 108 (90 Base) MCG/ACT inhaler   Follow up          Meds ordered this encounter  Medications  . hydrOXYzine (ATARAX/VISTARIL) 10 MG tablet    Sig: Take 1 tablet (10 mg total) by mouth 3 (three) times daily as needed.    Dispense:  90 tablet    Refill:  11  . hydrocortisone cream 1 %    Sig: Apply 1 application topically 2 (two) times daily.    Dispense:  30 g    Refill:  6  . blood glucose meter kit and supplies    Sig: Dispense based on patient and insurance preference. Use up to four times daily for blood glucose readings greater than 125.    Dispense:  1 each    Refill:  0    Order Specific Question:   Number of strips    Answer:   100    Order Specific Question:   Number of lancets    Answer:   100  . cetirizine (ZYRTEC ALLERGY) 10 MG tablet    Sig: Take 1 tablet (10 mg total) by mouth daily.    Dispense:  30 tablet    Refill:  12  . albuterol (VENTOLIN HFA) 108 (90 Base) MCG/ACT inhaler    Sig: Inhale 2 puffs into the lungs every 6 (six) hours as needed for wheezing or shortness of breath.    Dispense:  8 g    Refill:  11    Follow-up: No  follow-ups on file.    Azzie Glatter, FNP

## 2020-09-20 ENCOUNTER — Telehealth: Payer: Self-pay | Admitting: Licensed Clinical Social Worker

## 2020-09-20 ENCOUNTER — Telehealth: Payer: Self-pay

## 2020-09-20 NOTE — Progress Notes (Signed)
(  KeyDoristine Devoid) Rx #: [id number] Levemir 100UNIT/ML solution   Form [employer] Electronic PA Form (2017 NCPDP) Created 16 hours ago Sent to Plan 2 minutes ago Plan Response less than a minute ago Submit Clinical Questions Determination N/A Message from Plan Your PA has been resolved, no additional PA is required. For further inquiries please contact the number on the back of the member prescription card. (Message 1005)

## 2020-09-20 NOTE — Telephone Encounter (Signed)
R/s appt pt voiced understanding

## 2020-09-20 NOTE — Progress Notes (Signed)
Heart and Vascular Care Navigation  09/20/2020  Ison Wichmann 1976/02/27 [id number]  Reason for Referral:  SNAP application; appt Friday                                                                                                  Assessment:                           LCSW received a call from pt on 3/2, pt shares that he has not yet heard from the [facility] regarding SNAP application yet at this time. I let pt know that I would f/u with pt after speaking w/ [facility]. Pt also expressed concern regarding cost of Friday's appointment scheduled w/ pharmacy to get established w/ coumadin clinic, he shared that he was thinking of cancelling this visit. I encouraged him to keep the appointment. LCSW will also see if I can get any clarity regarding that visit and call pt back.    I was able to speak w/ Laurin Coder at [facility] who shares that she has finally been able to get in touch w/ pt (she had previously attempted multiple times but was limited by pt lack of voicemail). Pt application will be submitted this week for SNAP (food stamps). I also was able to speak w/ pharmacy and front desk, pt needs to come and have this visit to assess medication mgmt moving forward. Per front desk pt copay will vary as pharmacy copays are generally not the same as the specialty visit copay. They would be able to bill this to pt.   I attempted to reach pt to tell him, no answer and unable to leave a voicemail.              HRT/VAS Care Coordination    Patients Home Cardiology Office Loc [facility]   Outpatient Care Team Social Worker   Social Worker Name: Esmeralda Links [place], [phone]   Living arrangements for the past 2 months Apartment   Lives with: Adult Children   Patient Current Insurance Scientist, water quality   Patient Has Concern With Paying Medical Bills No   Does Patient Have Prescription Coverage? Yes   Home Assistive Devices/Equipment None    DME Agency NA   [facility] Agency [facility]      Social History:                                                                             SDOH Screenings   Alcohol Screen: Not on file  Depression (PHQ2-9): Low Risk   . PHQ-2 Score: 0  Financial Resource Strain: Not on file  Food Insecurity: No Food Insecurity  . Worried About Programme researcher, broadcasting/film/video in the Last Year: Never true  .  Ran Out of Food in the Last Year: Never true  Housing: Low Risk   . Last Housing Risk Score: 0  Physical Activity: Inactive  . Days of Exercise per Week: 0 days  . Minutes of Exercise per Session: 0 min  Social Connections: Not on file  Stress: Stress Concern Present  . Feeling of Stress : To some extent  Tobacco Use: Low Risk   . Smoking Tobacco Use: Never Smoker  . Smokeless Tobacco Use: Never Used  Transportation Needs: No Transportation Needs  . Lack of Transportation (Medical): No  . Lack of Transportation (Non-Medical): No    SDOH Interventions: Food Insecurity:  Food Insecurity Interventions: Assist with SNAP Application (f/u w. [facility])    Other Care Navigation Interventions:     Patient Referred to: [facility]   Follow-up plan:   LCSW will reattempt to reach pt via telephone.

## 2020-09-24 ENCOUNTER — Encounter: Payer: Self-pay | Admitting: Family Medicine

## 2020-09-25 ENCOUNTER — Emergency Department (HOSPITAL_COMMUNITY): Payer: BC Managed Care – PPO

## 2020-09-25 ENCOUNTER — Encounter (HOSPITAL_COMMUNITY): Payer: Self-pay

## 2020-09-25 ENCOUNTER — Other Ambulatory Visit: Payer: Self-pay

## 2020-09-25 ENCOUNTER — Inpatient Hospital Stay (HOSPITAL_COMMUNITY)
Admission: EM | Admit: 2020-09-25 | Discharge: 2020-09-27 | DRG: 065 | Disposition: A | Discharge status: Home Health | Payer: BC Managed Care – PPO | Attending: Internal Medicine | Admitting: Internal Medicine

## 2020-09-25 DIAGNOSIS — Z91120 Patient's intentional underdosing of medication regimen due to financial hardship: Secondary | ICD-10-CM

## 2020-09-25 DIAGNOSIS — G8321 Monoplegia of upper limb affecting right dominant side: Secondary | ICD-10-CM | POA: Diagnosis present

## 2020-09-25 DIAGNOSIS — Z79899 Other long term (current) drug therapy: Secondary | ICD-10-CM

## 2020-09-25 DIAGNOSIS — R29810 Facial weakness: Secondary | ICD-10-CM | POA: Diagnosis present

## 2020-09-25 DIAGNOSIS — R791 Abnormal coagulation profile: Secondary | ICD-10-CM | POA: Diagnosis present

## 2020-09-25 DIAGNOSIS — I69320 Aphasia following cerebral infarction: Secondary | ICD-10-CM | POA: Diagnosis not present

## 2020-09-25 DIAGNOSIS — R29704 NIHSS score 4: Secondary | ICD-10-CM | POA: Diagnosis not present

## 2020-09-25 DIAGNOSIS — I428 Other cardiomyopathies: Secondary | ICD-10-CM | POA: Diagnosis present

## 2020-09-25 DIAGNOSIS — I5042 Chronic combined systolic (congestive) and diastolic (congestive) heart failure: Secondary | ICD-10-CM | POA: Diagnosis present

## 2020-09-25 DIAGNOSIS — T45516A Underdosing of anticoagulants, initial encounter: Secondary | ICD-10-CM | POA: Diagnosis present

## 2020-09-25 DIAGNOSIS — E669 Obesity, unspecified: Secondary | ICD-10-CM | POA: Diagnosis not present

## 2020-09-25 DIAGNOSIS — Z9114 Patient's other noncompliance with medication regimen: Secondary | ICD-10-CM

## 2020-09-25 DIAGNOSIS — Z7984 Long term (current) use of oral hypoglycemic drugs: Secondary | ICD-10-CM | POA: Diagnosis not present

## 2020-09-25 DIAGNOSIS — E1165 Type 2 diabetes mellitus with hyperglycemia: Secondary | ICD-10-CM | POA: Diagnosis not present

## 2020-09-25 DIAGNOSIS — R29818 Other symptoms and signs involving the nervous system: Secondary | ICD-10-CM | POA: Diagnosis not present

## 2020-09-25 DIAGNOSIS — I6389 Other cerebral infarction: Secondary | ICD-10-CM | POA: Diagnosis not present

## 2020-09-25 DIAGNOSIS — I639 Cerebral infarction, unspecified: Secondary | ICD-10-CM | POA: Diagnosis not present

## 2020-09-25 DIAGNOSIS — Z8616 Personal history of COVID-19: Secondary | ICD-10-CM | POA: Diagnosis not present

## 2020-09-25 DIAGNOSIS — Z794 Long term (current) use of insulin: Secondary | ICD-10-CM

## 2020-09-25 DIAGNOSIS — I63442 Cerebral infarction due to embolism of left cerebellar artery: Principal | ICD-10-CM | POA: Diagnosis present

## 2020-09-25 DIAGNOSIS — E559 Vitamin D deficiency, unspecified: Secondary | ICD-10-CM | POA: Diagnosis not present

## 2020-09-25 DIAGNOSIS — Z8249 Family history of ischemic heart disease and other diseases of the circulatory system: Secondary | ICD-10-CM

## 2020-09-25 DIAGNOSIS — Z86718 Personal history of other venous thrombosis and embolism: Secondary | ICD-10-CM | POA: Diagnosis not present

## 2020-09-25 DIAGNOSIS — R17 Unspecified jaundice: Secondary | ICD-10-CM | POA: Diagnosis present

## 2020-09-25 DIAGNOSIS — Z841 Family history of disorders of kidney and ureter: Secondary | ICD-10-CM

## 2020-09-25 DIAGNOSIS — E785 Hyperlipidemia, unspecified: Secondary | ICD-10-CM | POA: Diagnosis not present

## 2020-09-25 DIAGNOSIS — Z7901 Long term (current) use of anticoagulants: Secondary | ICD-10-CM | POA: Diagnosis not present

## 2020-09-25 DIAGNOSIS — R053 Chronic cough: Secondary | ICD-10-CM | POA: Diagnosis present

## 2020-09-25 DIAGNOSIS — I6300 Cerebral infarction due to thrombosis of unspecified precerebral artery: Secondary | ICD-10-CM | POA: Diagnosis not present

## 2020-09-25 DIAGNOSIS — U071 COVID-19: Secondary | ICD-10-CM | POA: Diagnosis not present

## 2020-09-25 DIAGNOSIS — R531 Weakness: Secondary | ICD-10-CM | POA: Diagnosis not present

## 2020-09-25 DIAGNOSIS — G9389 Other specified disorders of brain: Secondary | ICD-10-CM | POA: Diagnosis not present

## 2020-09-25 DIAGNOSIS — I513 Intracardiac thrombosis, not elsewhere classified: Secondary | ICD-10-CM | POA: Diagnosis not present

## 2020-09-25 DIAGNOSIS — E1169 Type 2 diabetes mellitus with other specified complication: Secondary | ICD-10-CM

## 2020-09-25 DIAGNOSIS — Z6838 Body mass index (BMI) 38.0-38.9, adult: Secondary | ICD-10-CM

## 2020-09-25 DIAGNOSIS — I110 Hypertensive heart disease with heart failure: Secondary | ICD-10-CM | POA: Diagnosis not present

## 2020-09-25 LAB — COMPREHENSIVE METABOLIC PANEL
ALT: 53 U/L — ABNORMAL HIGH (ref 0–44)
AST: 99 U/L — ABNORMAL HIGH (ref 15–41)
Albumin: 3.4 g/dL — ABNORMAL LOW (ref 3.5–5.0)
Alkaline Phosphatase: 119 U/L (ref 38–126)
Anion gap: 8 (ref 5–15)
BUN: 17 mg/dL (ref 6–20)
CO2: 27 mmol/L (ref 22–32)
Calcium: 9.4 mg/dL (ref 8.9–10.3)
Chloride: 101 mmol/L (ref 98–111)
Creatinine, Ser: 1.31 mg/dL — ABNORMAL HIGH (ref 0.61–1.24)
GFR, Estimated: >60 mL/min (ref >60)
Glucose, Bld: 152 mg/dL — ABNORMAL HIGH (ref 70–99)
Potassium: 4.2 mmol/L (ref 3.5–5.1)
Sodium: 136 mmol/L (ref 135–145)
Total Bilirubin: 1.1 mg/dL (ref 0.3–1.2)
Total Protein: 6.9 g/dL (ref 6.5–8.1)

## 2020-09-25 LAB — I-STAT CHEM 8, ED
BUN: 21 mg/dL — ABNORMAL HIGH (ref 6–20)
Calcium, Ion: 1.16 mmol/L (ref 1.15–1.40)
Chloride: 100 mmol/L (ref 98–111)
Creatinine, Ser: 1.3 mg/dL — ABNORMAL HIGH (ref 0.61–1.24)
Glucose, Bld: 151 mg/dL — ABNORMAL HIGH (ref 70–99)
HCT: 50 % (ref 39.0–52.0)
Hemoglobin: 17 g/dL (ref 13.0–17.0)
Potassium: 4.2 mmol/L (ref 3.5–5.1)
Sodium: 140 mmol/L (ref 135–145)
TCO2: 28 mmol/L (ref 22–32)

## 2020-09-25 LAB — DIFFERENTIAL
Abs Immature Granulocytes: 0.02 10*3/uL (ref 0.00–0.07)
Basophils Absolute: 0.1 10*3/uL (ref 0.0–0.1)
Basophils Relative: 1 %
Eosinophils Absolute: 0.1 10*3/uL (ref 0.0–0.5)
Eosinophils Relative: 2 %
Immature Granulocytes: 0 %
Lymphocytes Relative: 44 %
Lymphs Abs: 2.8 10*3/uL (ref 0.7–4.0)
Monocytes Absolute: 0.6 10*3/uL (ref 0.1–1.0)
Monocytes Relative: 9 %
Neutro Abs: 2.8 10*3/uL (ref 1.7–7.7)
Neutrophils Relative %: 44 %

## 2020-09-25 LAB — CBG MONITORING, ED
Glucose-Capillary: 148 mg/dL — ABNORMAL HIGH (ref 70–99)
Glucose-Capillary: 123 mg/dL — ABNORMAL HIGH (ref 70–99)
Glucose-Capillary: 127 mg/dL — ABNORMAL HIGH (ref 70–99)

## 2020-09-25 LAB — LIPID PANEL
Cholesterol: 99 mg/dL (ref 0–200)
HDL: 29 mg/dL — ABNORMAL LOW (ref >40)
LDL Cholesterol: 59 mg/dL (ref 0–99)
Total CHOL/HDL Ratio: 3.4 RATIO
Triglycerides: 57 mg/dL (ref <150)
VLDL: 11 mg/dL (ref 0–40)

## 2020-09-25 LAB — ECHOCARDIOGRAM LIMITED
Height: 73 in
S' Lateral: 5.7 cm
Weight: 4688 oz

## 2020-09-25 LAB — GLUCOSE, CAPILLARY: Glucose-Capillary: 131 mg/dL — ABNORMAL HIGH (ref 70–99)

## 2020-09-25 LAB — PROTIME-INR
INR: 1.3 — ABNORMAL HIGH (ref 0.8–1.2)
Prothrombin Time: 15.9 seconds — ABNORMAL HIGH (ref 11.4–15.2)

## 2020-09-25 LAB — HEPARIN LEVEL (UNFRACTIONATED): Heparin Unfractionated: <0.1 IU/mL — ABNORMAL LOW (ref 0.30–0.70)

## 2020-09-25 LAB — CBC
HCT: 50.2 % (ref 39.0–52.0)
Hemoglobin: 15.5 g/dL (ref 13.0–17.0)
MCH: 25.6 pg — ABNORMAL LOW (ref 26.0–34.0)
MCHC: 30.9 g/dL (ref 30.0–36.0)
MCV: 83 fL (ref 80.0–100.0)
Platelets: 264 10*3/uL (ref 150–400)
RBC: 6.05 MIL/uL — ABNORMAL HIGH (ref 4.22–5.81)
RDW: 15.9 % — ABNORMAL HIGH (ref 11.5–15.5)
WBC: 6.3 10*3/uL (ref 4.0–10.5)
nRBC: 0 % (ref 0.0–0.2)

## 2020-09-25 LAB — HEMOGLOBIN A1C
Hgb A1c MFr Bld: 7.9 % — ABNORMAL HIGH (ref 4.8–5.6)
Mean Plasma Glucose: 180.03 mg/dL

## 2020-09-25 LAB — RESP PANEL BY RT-PCR (FLU A&B, COVID) ARPGX2
Influenza A by PCR: NEGATIVE
Influenza B by PCR: NEGATIVE
SARS Coronavirus 2 by RT PCR: POSITIVE — AB

## 2020-09-25 LAB — APTT: aPTT: 31 seconds (ref 24–36)

## 2020-09-25 MED ORDER — ACETAMINOPHEN 325 MG PO TABS: 650.0000 mg | ORAL_TABLET | Freq: Four times a day (QID) | ORAL | Status: DC | PRN

## 2020-09-25 MED ORDER — CARVEDILOL 12.5 MG PO TABS
12.5000 mg | ORAL_TABLET | Freq: Two times a day (BID) | ORAL | Status: DC
  Administered 2020-09-25 – 2020-09-27 (×4): 12.5 mg via ORAL
  Filled 2020-09-25 (×4): qty 1

## 2020-09-25 MED ORDER — LOSARTAN POTASSIUM 25 MG PO TABS
25.0000 mg | ORAL_TABLET | Freq: Every day | ORAL | Status: DC
  Administered 2020-09-26 – 2020-09-27 (×2): 25 mg via ORAL
  Filled 2020-09-25 (×2): qty 1

## 2020-09-25 MED ORDER — IOHEXOL 350 MG/ML SOLN
100.0000 mL | Freq: Once | INTRAVENOUS | Status: AC | PRN
  Administered 2020-09-25: 100 mL via INTRAVENOUS

## 2020-09-25 MED ORDER — ACETAMINOPHEN 650 MG RE SUPP: 650.0000 mg | Freq: Four times a day (QID) | RECTAL | Status: DC | PRN

## 2020-09-25 MED ORDER — ATORVASTATIN CALCIUM 40 MG PO TABS
40.0000 mg | ORAL_TABLET | Freq: Every day | ORAL | Status: DC
  Administered 2020-09-26 – 2020-09-27 (×2): 40 mg via ORAL
  Filled 2020-09-25 (×2): qty 1

## 2020-09-25 MED ORDER — ATORVASTATIN CALCIUM 10 MG PO TABS
10.0000 mg | ORAL_TABLET | Freq: Every day | ORAL | Status: DC
  Administered 2020-09-25: 10 mg via ORAL
  Filled 2020-09-25: qty 1

## 2020-09-25 MED ORDER — SPIRONOLACTONE 12.5 MG HALF TABLET
12.5000 mg | ORAL_TABLET | Freq: Every day | ORAL | Status: DC
  Administered 2020-09-25 – 2020-09-27 (×3): 12.5 mg via ORAL
  Filled 2020-09-25 (×3): qty 1

## 2020-09-25 MED ORDER — HEPARIN (PORCINE) 25000 UT/250ML-% IV SOLN
1800.0000 [IU]/h | INTRAVENOUS | Status: AC
  Administered 2020-09-25: 1500 [IU]/h via INTRAVENOUS
  Administered 2020-09-26: 1800 [IU]/h via INTRAVENOUS
  Filled 2020-09-25 (×2): qty 250

## 2020-09-25 MED ORDER — INSULIN ASPART 100 UNIT/ML ~~LOC~~ SOLN
0.0000 [IU] | Freq: Three times a day (TID) | SUBCUTANEOUS | Status: DC
  Administered 2020-09-25: 2 [IU] via SUBCUTANEOUS
  Administered 2020-09-26: 3 [IU] via SUBCUTANEOUS
  Administered 2020-09-26 – 2020-09-27 (×3): 2 [IU] via SUBCUTANEOUS

## 2020-09-25 MED ORDER — SODIUM CHLORIDE 0.9% FLUSH
3.0000 mL | Freq: Once | INTRAVENOUS | Status: AC
  Administered 2020-09-25: 3 mL via INTRAVENOUS

## 2020-09-25 NOTE — ED Notes (Signed)
Echo at bedside

## 2020-09-25 NOTE — Progress Notes (Signed)
ANTICOAGULATION CONSULT NOTE - Initial Consult  Pharmacy Consult for Warfarin Indication: persistent LV thrombus  No Known Allergies  Patient Measurements: Height: 6\' 1"  (185.4 cm) Weight: 132.9 kg (293 lb) IBW/kg (Calculated) : 79.9 Heparin Dosing Weight: 109.8 kg  Vital Signs: Temp: 97.5 F (36.4 C) (03/08 0646) Temp Source: Oral (03/08 0646) BP: 135/95 (03/08 1137) Pulse Rate: 100 (03/08 1137)  Labs: Recent Labs    09/25/20 0655 09/25/20 0700  HGB 15.5 17.0  HCT 50.2 50.0  PLT 264  --   APTT 31  --   LABPROT 15.9*  --   INR 1.3*  --   CREATININE 1.31* 1.30*    Estimated Creatinine Clearance: 102.6 mL/min (A) (by C-G formula based on SCr of 1.3 mg/dL (H)).   Medical History: Past Medical History:  Diagnosis Date  . Coronavirus infection 06/2020  . COVID 06/2020  . Diabetes mellitus without complication (HCC)   . Hemoglobin A1C greater than 9%, indicating poor diabetic control 12/2019  . Hyperglycemia 12/2019  . Hyperlipidemia 12/2019  . Hypertension   . Hypertensive urgency 12/2019  . Noncompliance with medication regimen 12/2019  . Stroke (HCC)   . Urine ketones 12/2019  . Vitamin D deficiency 12/2019    Medications:  See medication history  Assessment: Pt is a 44 yom with a hx of LV thrombus (on warfarin 5 mg daily PTA w/ INR goal 2-2.5) presenting with new acute strokes on MRI. INR on admission subtherapeutic at 1.3 and pt admits to missing at least one dose. Hgb/HCT and platelets are WNL but infarctions noted on MRI. Will omit bolus, conservatively dose heparin drip and utilize a lower goal of therapy pending further cardiology and neuro workup.    Goal of Therapy:  Heparin level 0.3-0.5 units/ml Monitor platelets by anticoagulation protocol: Yes   Plan:  Start heparin infusion at 1500 units/hr.  Check Heparin Level in 6 hours.  Daily Heparin Level, INR, and CBC while on heparin.  Continue to monitor H&H and platelets Follow up transition to  oral anticoagulation per cardiology/neuro  01/2020 PharmD Candidate 2022 09/25/2020 12:11 PM

## 2020-09-25 NOTE — ED Notes (Signed)
Patient transported to MRI 

## 2020-09-25 NOTE — H&P (Cosign Needed Addendum)
Date: 09/25/2020               Patient Name:  Rodney Kane MRN: [id number]  DOB: 1975/08/14 Age / Sex: 45 y.o., male   PCP: Azzie Glatter, FNP         Medical Service: Internal Medicine Teaching Service         Attending Physician: Dr. Sid Falcon, MD    First Contact: Dr. Johnney Ou Pager: [phone]  Second Contact: Dr. Marianna Payment Pager: [phone]       After Hours (After 5p/  First Contact Pager: [phone]  weekends / holidays): Second Contact Pager: [phone]   Chief Complaint: Right arm weakness  History of Present Illness: This is a 45 year old male with a history of recent CVA with residual aphasia, hx of apical thrombus, hypertension, hyperlipidemia, diabetes mellitus, combined systolic and diastolic heart failure, COVID 19 (Dec 2021) and nonischemic cardiomyopathy who is presenting with right arm weakness.  Patient reports that around 556-5:58 AM this morning he was using his arms to stand up and noticed that his right arm was weaker than his left, he held his hands out in front of him and noticed that his right arm drift downwards, he mentioned this to his son and they decided to come into the ER for evaluation.  While he has been here he reports improvement in his weakness, states that his weakness has resolved at this time.  He does report that he thinks he had a fever yesterday.  He reports his speech is the same as when he last left the hospital, still has difficulty finding words and expressing them.  He denies any numbness, tingling, blurry vision, worsening speech difficulties, or difficulty with walking.  He denies any chest pain, shortness of breath, nausea, vomiting, abdominal pain, lightheadedness, dizziness, or other symptoms.  He does report that he will occasionally miss his medications.  He was admitted from 12/30-07/28/20 for expressive aphasia and staggering gait, noted to have an acute CVA, sepsis secondary to Covid, and acute on chronic heart failure  exacerbation.  He was continued on his statin and discharged with warfarin and followed up with the Coumadin clinic.  On chart review it appears that he has had difficulty with compliance to his Coumadin and has been subtherapeutic.  In the ED code stroke was called.  His last known well was unclear due to his aphasia TPA was not given due to a large stroke within the last 3 months.  CT head showed large left posterior chronic MCA, stable without acute intracranial process.  MRI brain obtained which showed multiple acute punctate strokes.  Neurology was on board. Consulted cardiology to discuss if patient may be DOAC candidate, ordered echocardiogram, A1c, and lipid panel.  Blood pressure goal normotension.  Patient admitted to IMTS.  Meds:  Current Meds  Medication Sig  . albuterol (VENTOLIN HFA) 108 (90 Base) MCG/ACT inhaler Inhale 2 puffs into the lungs every 6 (six) hours as needed for wheezing or shortness of breath.  Marland Kitchen atorvastatin (LIPITOR) 10 MG tablet Take 1 tablet (10 mg total) by mouth daily.  . blood glucose meter kit and supplies Dispense based on patient and insurance preference. Use up to four times daily for blood glucose readings greater than 125.  . carvedilol (COREG) 12.5 MG tablet Take 1 tablet (12.5 mg total) by mouth 2 (two) times daily with a meal.  . furosemide (LASIX) 40 MG tablet Take 80 mg  ( 2 tablets  of 40 mg) every other day with 40 mg (1 tablet) the opposite day  . glipiZIDE (GLUCOTROL) 10 MG tablet Take 1 tablet (10 mg total) by mouth 2 (two) times daily before a meal.  . hydrocortisone cream 1 % Apply 1 application topically 2 (two) times daily.  Marland Kitchen losartan (COZAAR) 50 MG tablet Take 1 tablet (50 mg total) by mouth 2 (two) times daily.  . polyethylene glycol powder (GLYCOLAX/MIRALAX) 17 GM/SCOOP powder Take 17 g by mouth daily.  . potassium chloride SA (KLOR-CON) 20 MEQ tablet Take 1 tablet (20 mEq total) by mouth daily.  . sitaGLIPtin (JANUVIA) 25 MG tablet Take 1  tablet (25 mg total) by mouth daily.  . Vitamin D, Ergocalciferol, (DRISDOL) 1.25 MG (50000 UNIT) CAPS capsule Take 1 capsule (50,000 Units total) by mouth every 7 (seven) days. (Patient taking differently: Take 50,000 Units by mouth every 7 (seven) days. Mondays)  . warfarin (COUMADIN) 5 MG tablet Take 1 tablet (5 mg total) by mouth daily at 4 PM.     Allergies: Allergies as of 09/25/2020  . (No Known Allergies)   Past Medical History:  Diagnosis Date  . Coronavirus infection 06/2020  . COVID 06/2020  . Diabetes mellitus without complication ([place])   . Hemoglobin A1C greater than 9%, indicating poor diabetic control 12/2019  . Hyperglycemia 12/2019  . Hyperlipidemia 12/2019  . Hypertension   . Hypertensive urgency 12/2019  . Noncompliance with medication regimen 12/2019  . Stroke ([place])   . Urine ketones 12/2019  . Vitamin D deficiency 12/2019    Family History:  Family History  Problem Relation Age of Onset  . Multiple sclerosis Mother   . Diabetic kidney disease Mother   . Hypertension Mother     Social History: Denies smoking, alcohol, or drug use.  Currently lives with his son.  Works at a [employer].  Review of Systems: A complete ROS was negative except as per HPI.   Physical Exam: Blood pressure (!) 130/91, pulse 95, temperature (!) 97.5 F (36.4 C), temperature source Oral, resp. rate (!) 28, height _0  (1.854 m), weight 132.9 kg, SpO2 99 %. Physical Exam Constitutional:      Appearance: Normal appearance.  HENT:     Head: Normocephalic and atraumatic.     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     Pharynx: Oropharynx is clear.  Eyes:     Extraocular Movements: Extraocular movements intact.     Conjunctiva/sclera: Conjunctivae normal.     Pupils: Pupils are equal, round, and reactive to light.  Cardiovascular:     Rate and Rhythm: Normal rate and regular rhythm.     Pulses: Normal pulses.     Heart  sounds: Normal heart sounds.  Pulmonary:     Effort: Pulmonary effort is normal.     Breath sounds: Normal breath sounds.  Abdominal:     General: Abdomen is flat. Bowel sounds are normal.     Palpations: Abdomen is soft.     Tenderness: There is no abdominal tenderness.  Musculoskeletal:        General: Normal range of motion.     Cervical back: Normal range of motion and neck supple.  Skin:    General: Skin is warm and dry.     Capillary Refill: Capillary refill takes less than 2 seconds.  Neurological:     Mental Status: He is alert.     Sensory:  Sensation is intact.     Motor: No weakness or atrophy.     Coordination: Coordination is intact.     Comments: Expressive aphasia  Psychiatric:        Mood and Affect: Mood normal.        Behavior: Behavior normal.    CT head without contrast: IMPRESSION: 1. Expected CT appearance of the left hemisphere infarct since December with encephalomalacia. No acute intracranial abnormality identified. ASPECTS 10. 2. These results were communicated to Dr. Curly Shores at 7:11 am on 09/25/2020 by text page via the [facility] messaging system.   CTA head and neck: CT cerebral perfusion contrast: IMPRESSION: 1. Suboptimal CTA and CTP exams related to contrast timing. No emergent large vessel occlusion identified. No infarct core detected. Erroneously ischemic penumbra on CTP seems to directly correspond to the December left hemisphere infarct.  2. Stable CTA Head and Neck since December when allowing for decreased arterial detail today.  3. Small bilateral layering pleural effusions also similar to the December CTA.   MRI brain without contrast: IMPRESSION: Small acute infarct left cerebellum. Possible acute infarct left parietal cortex  Chronic hemorrhagic infarct left occipital parietal lobe  Chronic microhemorrhage left basal ganglia. Correlate with hypertension history  EKG: personally reviewed my interpretation is regular  rhythm, tachycardia, heart rate around 100, low voltage, no acute ST changes  Echocardiogram:  Assessment & Plan by Problem: Active Problems:   CVA (cerebral vascular accident) ([place])  Acute infarct of left cerebellum, and left parietal cortex: Patient presenting with right upper extremity weakness, and persistent chronic expressive aphasia.  Right weakness has resolved.  Patient has recently been diagnosed with a left posterior MCA infarct in December 2021 with residual expressive aphasia.  He was noted to have a apical thrombus on echocardiogram and he had been started on warfarin.  He has had issues maintaining INR in therapeutic range, as well as some medication noncompliance.  His repeat echocardiogram continues to show a large apical thrombus.  He currently is on IV heparin drip for this.  Cardiology has been consulted to see if patient may be a DOAC candidate.  -Neurology following, appreciate recommendations -Follow-up on A1c and lipid panel -Cardiology consulted, appreciate recommendations -Continue on heparin drip -Neurology does not feel PT/OT is needed at this time -SLP or bedside swallow -Normotensive blood pressure goal per neurology  LV thrombus: Noted on echocardiogram.  Currently on heparin drip.  Did not appear to do well outpatient on his warfarin therapy.  Patient may benefit from a NOAC.  -Cardiology consulted, appreciate recommendations -Continue heparin drip  Diabetes mellitus: Patient is on glipizide 10 mg twice daily at home.    -Sliding scale insulin -Frequent CBGs  Hypertension: Patient is on carvedilol 12.5 mg twice daily and losartan 25 mg twice daily at home.  He does report occasionally missing his medications.  His blood pressure has been around 130s over 100s.  Goal blood pressure is normotensive per neurology.  -Continue carvedilol 12.5 mg twice daily -Continue losartan 25 mg daily  Hyperlipidemia: Patient is on atorvastatin 10 mg daily at  home. -Follow-up lipid panel -Continue home atorvastatin  Dispo: Admit patient to Inpatient with expected length of stay greater than 2 midnights.  Signed: Asencion Noble, MD 09/25/2020, 2:03 PM  Pager: [phone] After 5pm on weekdays and 1pm on weekends: On Call pager: [phone]

## 2020-09-25 NOTE — ED Notes (Signed)
Pt remains in MRI, no V/S updated at this time.

## 2020-09-25 NOTE — ED Notes (Signed)
Neurology at bedside reporting to this RN to stop Code stroke, EDP Dykstra aware. MRI is still planned.

## 2020-09-25 NOTE — Consult Note (Signed)
Neurology Consultation Reason for Consult: Code stroke  Requesting Physician: Marianna Fuss  CC:   History is obtained from: Patient and chart review   HPI: Rodney Kane Pacer is a 45 y.o. 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  He reports he was in his usual state of health when he began to have difficulty using his right arm.  Last known well is extremely difficult to characterize given his aphasia; he reports he lives with his son but could not give me the full 7 digit phone number for his son, and reported that his son was sleeping when his symptoms started and he only let his son know when he was leaving for the hospital.  What I can ascertain is that at some point this morning he began to have numbness and weakness of his right arm perhaps around 6 AM.  This was improving but not fully resolved at the time of my initial evaluation, and then fully resolved by the time my full examination was completed.  He denies any shaking of the arm.  Regarding his warfarin compliance he reports him "missed one dose recently"  Regarding his recent stroke, he initially presented with altered mental status and shortness of breath, found to be COVID-19 positive, with initial aphasia that worsened to global aphasia over the course of his hospitalization.  He was outside the window for TPA and there was no large vessel occlusion.  He was found to have an LV thrombus as his etiology and was started on warfarin per cardiology.  On review of INR trends he has had issues with compliance and staying within his goal of 2-2.5.  His hospital course was also complicated by severe sepsis and he was treated with remdesivir and steroids  LKW: Not  able to obtain reliably secondary to aphasia tPA given?: No, due to large stroke within the last 3 months IA performed?: No, symptoms resolved and no LVO identified Premorbid modified rankin scale:      2 - Slight disability. Able to look after own affairs without assistance, but unable to carry out all previous activities.  ROS: All other review of systems was negative except as noted in the HPI, with the caveat that he does have some aphasia Past Medical History:  Diagnosis Date  . Coronavirus infection 06/2020  . COVID 06/2020  . Diabetes mellitus without complication ([facility])   . Hemoglobin A1C greater than 9%, indicating poor diabetic control 12/2019  . Hyperglycemia 12/2019  . Hyperlipidemia 12/2019  . Hypertension   . Hypertensive urgency 12/2019  . Noncompliance with medication regimen 12/2019  . Stroke ([facility])   . Urine ketones 12/2019  . Vitamin D deficiency 12/2019     Family History  Problem Relation Age of Onset  . Multiple sclerosis Mother   . Diabetic kidney disease Mother   . Hypertension Mother      Social History:  reports that he has never smoked. He has never used smokeless tobacco. He reports that he does not drink alcohol and does not use drugs.   Exam: Current vital signs: BP (!) 143/104   Pulse (!) 102   Temp (!) 97.5 F (36.4 C) (Oral)   Resp 12   Ht 6\' 1"  (1.854 m)   Wt 132.9 kg   SpO2 100%  BMI 38.66 kg/m  Vital signs in last 24 hours: Temp:  [97.5 F (36.4 C)] 97.5 F (36.4 C) (03/08 0646) Pulse Rate:  [101-110] 102 (03/08 0745) Resp:  [12-35] 12 (03/08 0745) BP: (135-143)/(103-108) 143/104 (03/08 0745) SpO2:  [100 %] 100 % (03/08 0745) Weight:  [132.9 kg] 132.9 kg (03/08 0756)   Physical Exam  Constitutional: Appears well-developed and well-nourished.  Psych: Affect appropriate to situation, mildly anxious/frustrated given communication difficulties Eyes: No scleral injection HENT: No oropharyngeal obstruction.  MSK: no joint  deformities.  Cardiovascular: Normal rate and regular rhythm.  Respiratory: Effort normal, non-labored breathing GI: Soft.  No distension. There is no tenderness.  Obese and protuberant Skin: Warm dry and intact visible skin  Neuro: Mental Status: Patient is awake, alert, oriented to person, place, month, year, day of the week and situation. Patient is able to give some history but has a great deal of difficulty reporting the time of onset and indicating when he was last able to use his right arm normally. Disfluent speech.  Difficulty with reporting numbers, at some point saying symptoms started at 545, 645, 7, 6 AM etc. Able to name simple objects such as pen.  Poor repetition Cranial Nerves: II: Visual Fields are full. Pupils are equal, round, and reactive to light.   III,IV, VI: EOMI without ptosis or diploplia.  V: Facial sensation is symmetric to temperature VII: Facial movement initially notable for mild right facial droop, then symmetric VIII: hearing is intact to voice XII: tongue is midline without atrophy or fasciculations.  Motor: Tone is normal. Bulk is normal.  Initially had some mild right hand pronator drift which resolved on subsequent testing.  At end of evaluation, no pronator drift, and no drift of the bilateral lower extremities.  Equal grip strength 5/5 bilaterally Sensory: Sensation is symmetric to light touch in both arms without extinction to double sided stimulation Deep Tendon Reflexes: 2+ and symmetric in the biceps and patellae.  Plantars: Toes are downgoing bilaterally.  Cerebellar: FNF and HKS are intact bilaterally  NIHSS total 4 Score breakdown:  1 point for facial paresis, 1 point for right arm drift, 1 point for right arm sensory loss, 1 point for mild to moderate aphasia  I have reviewed labs in epic and the results pertinent to this consultation are:  Creatinine 1.3  Lab Results  Component Value Date   INR 1.3 (H) 09/25/2020   INR 1.5 (A)  09/14/2020   INR 4.3 (A) 09/05/2020    I have reviewed the images obtained: Head CT personally reviewed, large left posterior chronic MCA stroke, stable without any acute intracranial process CTA/CTP personally reviewed, suboptimal given his low EF, but there is no acute large vessel occlusion or tissue at risk, area identified by RAPID software corresponds to old stroke  Impression:   Recommendations: - MRI brain w/o contrast to determine when it is safe to resume anticoagulation       - personally reviewed, several punctate strokes - Reach out to cardiology to discuss if patient may be DOAC candidate given persistent subtherapeutic INRs - Repeat ECHO to evaluate EF and LV thrombus at this time - heparin drip given high index of suspicion for persistent LV thrombus with new acute strokes on MRI that are punctate and therefore low risk of hemorrhagic conversion despite acuity; risks are outweighed by benefit - A1c and lipid panel repeat  - hold antiplatelets given on anticoagulation - No need for PT/OT at this time give patient at baseline  -  NPO until passes bedside swallow eval - BP goal normotension from neurology perspective, appreciate cardiology comment on narrow pulse pressure - Stroke team to follow       Lab Results  Component Value Date   HGBA1C 7.7 (H) 07/20/2020   Lab Results  Component Value Date   CHOL 145 07/20/2020   HDL 31 (L) 07/20/2020   LDLCALC 102 (H) 07/20/2020   TRIG 62 07/20/2020   CHOLHDL 4.7 07/20/2020    Brooke Dare MD-PhD [facility] [phone]

## 2020-09-25 NOTE — ED Triage Notes (Signed)
Pt states LSN at 6 am began to have R sided arm weakness and drift, hx of stroke in the past

## 2020-09-25 NOTE — Progress Notes (Signed)
ANTICOAGULATION CONSULT NOTE  Pharmacy Consult:  Heparin Indication: persistent LV thrombus  No Known Allergies  Patient Measurements: Height: 6\' 1"  (185.4 cm) Weight: 132.9 kg (293 lb) IBW/kg (Calculated) : 79.9 Heparin Dosing Weight: 109.8 kg  Vital Signs: Temp: 97.8 F (36.6 C) (03/08 1822) Temp Source: Oral (03/08 1822) BP: 141/106 (03/08 1822) Pulse Rate: 100 (03/08 1822)  Labs: Recent Labs    09/25/20 0655 09/25/20 0700 09/25/20 1851  HGB 15.5 17.0  --   HCT 50.2 50.0  --   PLT 264  --   --   APTT 31  --   --   LABPROT 15.9*  --   --   INR 1.3*  --   --   HEPARINUNFRC  --   --  <0.10*  CREATININE 1.31* 1.30*  --     Estimated Creatinine Clearance: 102.6 mL/min (A) (by C-G formula based on SCr of 1.3 mg/dL (H)).   Assessment: Pt is a 44 yom with a hx of LV thrombus (on warfarin 5 mg daily PTA w/ INR goal 2-2.5) presenting with new acute strokes on MRI.  INR on admission subtherapeutic at 1.3 and pt admits to missing at least one dose.  Infarctions noted on MRI. Pharmacy consulted for IV heparin dosing.  Initial heparin level is sub-therapeutic.  No issue with heparin infusion, IV site nor bleeding per RN. Spoke to phlebotomist, no reason to question integrity of lab collection.  Hesitant to increase heparin aggressively given trend from last admission (ranging [phone] units/hr and supra-therapeutic on 1500 units/hr).  Goal of Therapy:  Heparin level 0.3-0.5 units/ml Monitor platelets by anticoagulation protocol: Yes   Plan:  Increase heparin infusion to 1600 units/hr Check 6 hr heparin level  Zi Newbury D. 11/25/20, PharmD, BCPS, BCCCP 09/25/2020, 8:08 PM

## 2020-09-25 NOTE — Hospital Course (Addendum)
Vit d Losartan 25 daily K chloride 20 daily Coreg 12.5 BID Lasix 40 daily Warfarin 5 daily A\torvastatn 40 Glipizide 10 bid

## 2020-09-25 NOTE — ED Provider Notes (Signed)
[facility] EMERGENCY DEPARTMENT Provider Note   CSN: [id number] Arrival date & time: 09/25/20  2536     History Chief Complaint  Patient presents with  . Code Stroke    Rodney Kane is a 45 y.o. male.  Presenting to ER with concern for right-sided weakness.  Initial last seen normal 6 AM.  History was challenging secondary to patient's baseline expressive aphasia.  Patient reporting that symptoms started around 6 AM however unable to confirm when he woke up this morning and if symptoms were present when he initially awoke.  Unsure what time went to bed.  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  Also has history of heart failure, nonischemic cardiomyopathy.  HPI     Past Medical History:  Diagnosis Date  . Coronavirus infection 06/2020  . COVID 06/2020  . Diabetes mellitus without complication ([place])   . Hemoglobin A1C greater than 9%, indicating poor diabetic control 12/2019  . Hyperglycemia 12/2019  . Hyperlipidemia 12/2019  . Hypertension   . Hypertensive urgency 12/2019  . Noncompliance with medication regimen 12/2019  . Stroke ([place])   . Urine ketones 12/2019  . Vitamin D deficiency 12/2019    Patient Active Problem List   Diagnosis Date Noted  . Atrial fibrillation ([place]) 08/13/2020  . Acute venous embolism and thrombosis of deep vessels of proximal lower extremity ([place]) 08/13/2020  . Long term (current) use of anticoagulants 08/13/2020  . 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 Prolonged QT interval 07/25/2020  . Sinus tachycardia 07/20/2020  . Acute CVA (cerebrovascular accident) ([place]) 07/20/2020  . COVID-19 virus infection 07/19/2020  . Noncompliance with medication regimen 01/04/2020  . Hyperglycemia 01/04/2020  . Urine ketones 01/04/2020  . Essential hypertension 01/04/2020  . NICM (nonischemic cardiomyopathy) ([place]) 01/16/2017  . 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 Acute combined systolic and diastolic heart failure ([place])   . Elevated troponin   . 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 Diabetes ([place]) 01/02/2017    Past Surgical History:  Procedure Laterality Date  . left knee surgery    . RIGHT/LEFT HEART CATH AND CORONARY ANGIOGRAPHY N/A 01/05/2017   Procedure: Right/Left Heart Cath and Coronary Angiography;  Surgeon: Leonie Man, MD;  Location: [facility];  Service: Cardiovascular;  Laterality: N/A;       Family History  Problem Relation Age of Onset  . Multiple sclerosis Mother   . Diabetic kidney disease Mother   . Hypertension Mother     Social History   Tobacco Use  . Smoking status: Never Smoker  . Smokeless tobacco: Never Used  Vaping Use  . Vaping Use: Never used  Substance Use Topics  . Alcohol use: No  . Drug use: No    Home Medications Prior to Admission medications   Medication Sig Start Date End Date Taking? Authorizing Provider  albuterol (VENTOLIN HFA) 108 (90 Base) MCG/ACT inhaler Inhale 2 puffs into the lungs every 6 (six) hours as needed for wheezing or shortness of breath. 09/19/20   Azzie Glatter, FNP  atorvastatin (LIPITOR) 10 MG tablet Take 1 tablet (10 mg total) by mouth daily. 01/05/20   Azzie Glatter, FNP  blood glucose meter kit and supplies Dispense based on patient and insurance preference. Use up to four times daily for blood glucose readings greater than 125. 09/19/20   Azzie Glatter, FNP  carvedilol (COREG) 12.5 MG tablet Take  1 tablet (12.5 mg total) by mouth 2 (two) times daily with a meal. 08/21/20   Azzie Glatter, FNP  cetirizine (ZYRTEC ALLERGY) 10 MG tablet Take 1 tablet (10 mg total) by mouth daily. 09/19/20   Azzie Glatter, FNP  furosemide (LASIX) 40 MG tablet Take 80 mg  ( 2 tablets of 40 mg) every other day with 40 mg (1 tablet) the opposite day 09/14/20   Leonie Man, MD  glipiZIDE (GLUCOTROL) 10 MG tablet Take 1 tablet (10 mg total) by  mouth 2 (two) times daily before a meal. 09/17/20   Azzie Glatter, FNP  hydrocortisone cream 1 % Apply 1 application topically 2 (two) times daily. 09/19/20   Azzie Glatter, FNP  hydrOXYzine (ATARAX/VISTARIL) 10 MG tablet Take 1 tablet (10 mg total) by mouth 3 (three) times daily as needed. 09/19/20   Azzie Glatter, FNP  insulin aspart (NOVOLOG) 100 UNIT/ML injection Inject 5 Units into the skin 3 (three) times daily with meals. Patient not taking: Reported on 09/19/2020 08/28/20   Azzie Glatter, FNP  insulin detemir (LEVEMIR) 100 UNIT/ML injection Inject 0.1 mLs (10 Units total) into the skin daily. 09/19/20   Azzie Glatter, FNP  losartan (COZAAR) 50 MG tablet Take 1 tablet (50 mg total) by mouth 2 (two) times daily. 09/14/20 12/13/20  Leonie Man, MD  polyethylene glycol powder [facility]) 17 GM/SCOOP powder Take 17 g by mouth daily. 08/29/20   Azzie Glatter, FNP  potassium chloride SA (KLOR-CON) 20 MEQ tablet Take 1 tablet (20 mEq total) by mouth daily. 09/05/20   Erlene Quan, PA-C  sitaGLIPtin (JANUVIA) 25 MG tablet Take 1 tablet (25 mg total) by mouth daily. Patient not taking: Reported on 09/19/2020 09/17/20   Azzie Glatter, FNP  spironolactone (ALDACTONE) 25 MG tablet Take 1 tablet (25 mg total) by mouth daily. Patient not taking: Reported on 09/19/2020 09/14/20 12/13/20  Leonie Man, MD  Vitamin D, Ergocalciferol, (DRISDOL) 1.25 MG (50000 UNIT) CAPS capsule Take 1 capsule (50,000 Units total) by mouth every 7 (seven) days. Patient taking differently: Take 50,000 Units by mouth every Saturday. 01/05/20   Azzie Glatter, FNP  warfarin (COUMADIN) 5 MG tablet Take 1 tablet (5 mg total) by mouth daily at 4 PM. 07/28/20   Samuella Cota, MD  zinc sulfate 220 (50 Zn) MG capsule Take 1 capsule (220 mg total) by mouth daily. Patient not taking: Reported on 09/19/2020 07/28/20   Samuella Cota, MD    Allergies    Patient has no known allergies.  Review of Systems    Review of Systems  Unable to perform ROS: Acuity of condition    Physical Exam Updated Vital Signs BP (!) 141/108 (BP Location: Left Arm)   Pulse (!) 110   Temp (!) 97.5 F (36.4 C) (Oral)   Resp 16   SpO2 100%   Physical Exam Vitals and nursing note reviewed.  Constitutional:      Appearance: He is well-developed and well-nourished.  HENT:     Head: Normocephalic and atraumatic.  Eyes:     Conjunctiva/sclera: Conjunctivae normal.  Cardiovascular:     Rate and Rhythm: Normal rate and regular rhythm.     Heart sounds: No murmur heard.   Pulmonary:     Effort: Pulmonary effort is normal. No respiratory distress.     Breath sounds: Normal breath sounds.  Abdominal:     Palpations: Abdomen is soft.  Tenderness: There is no abdominal tenderness.  Musculoskeletal:        General: No edema.     Cervical back: Neck supple.  Skin:    General: Skin is warm and dry.  Neurological:     Mental Status: He is alert.     Comments: Alert, oriented  Expressive aphasia noted  Cranial nerves II through XII grossly intact  Strength mildly decreased on right arm but sensation intact in all 4 extremities  Psychiatric:        Mood and Affect: Mood and affect normal.     ED Results / Procedures / Treatments   Labs (all labs ordered are listed, but only abnormal results are displayed) Labs Reviewed  PROTIME-INR - Abnormal; Notable for the following components:      Result Value   Prothrombin Time 15.9 (*)    INR 1.3 (*)    All other components within normal limits  CBC - Abnormal; Notable for the following components:   RBC 6.05 (*)    MCH 25.6 (*)    RDW 15.9 (*)    All other components within normal limits  CBG MONITORING, ED - Abnormal; Notable for the following components:   Glucose-Capillary 148 (*)    All other components within normal limits  I-STAT CHEM 8, ED - Abnormal; Notable for the following components:   BUN 21 (*)    Creatinine, Ser 1.30 (*)     Glucose, Bld 151 (*)    All other components within normal limits  RESP PANEL BY RT-PCR (FLU A&B, COVID) ARPGX2  APTT  DIFFERENTIAL  COMPREHENSIVE METABOLIC PANEL  CBG MONITORING, ED    EKG None  Radiology CT HEAD CODE STROKE WO CONTRAST  Result Date: 09/25/2020 CLINICAL DATA:  Code stroke. 45 year old male status post posterior left MCA/PCA watershed area infarct in December. Acute onset right arm weakness and drift this morning. EXAM: CT HEAD WITHOUT CONTRAST TECHNIQUE: Contiguous axial images were obtained from the base of the skull through the vertex without intravenous contrast. COMPARISON:  Brain MRI 07/20/2020.  Head CT 07/19/2020. FINDINGS: Brain: Confluent hypodensity in the posterior left temporal and lateral occipital lobes corresponds to diffusion abnormality in December and is compatible with post ischemic encephalomalacia. Mild asymmetry of the lateral ventricles is stable, with no midline shift or intracranial mass effect evident. Gray-white matter differentiation elsewhere is stable. No acute intracranial hemorrhage identified. No acute cortically based infarct identified. Vascular: No suspicious intracranial vascular hyperdensity. Skull: No acute osseous abnormality identified. Sinuses/Orbits: Visualized paranasal sinuses and mastoids are stable and well pneumatized. Other: Stable orbit and scalp soft tissues. ASPECTS [facility] Stroke Program Early CT Score) Total score (0-10 with 10 being normal): 10 (chronic left hemisphere encephalomalacia) IMPRESSION: 1. Expected CT appearance of the left hemisphere infarct since December with encephalomalacia. No acute intracranial abnormality identified. ASPECTS 10. 2. These results were communicated to Dr. Curly Shores at 7:11 am on 09/25/2020 by text page via the [facility] messaging system. Electronically Signed   By: Genevie Ann M.D.   On: 09/25/2020 07:12    Procedures .Critical Care Performed by: Lucrezia Starch, MD Authorized by: Lucrezia Starch, MD   Critical care provider statement:    Critical care time (minutes):  43   Critical care was necessary to treat or prevent imminent or life-threatening deterioration of the following conditions:  CNS failure or compromise   Critical care was time spent personally by me on the following activities:  Discussions with consultants, evaluation of patient's  response to treatment, examination of patient, ordering and performing treatments and interventions, ordering and review of laboratory studies, ordering and review of radiographic studies, pulse oximetry, re-evaluation of patient's condition, obtaining history from patient or surrogate and review of old charts     Medications Ordered in ED Medications  sodium chloride flush (NS) 0.9 % injection 3 mL (has no administration in time range)    ED Course  I have reviewed the triage vital signs and the nursing notes.  Pertinent labs & imaging results that were available during my care of the patient were reviewed by me and considered in my medical decision making (see chart for details).  Clinical Course as of 09/26/20 0731  Tue Sep 25, 2020  1357 CT HEAD CODE STROKE WO CONTRAST [AC]    Clinical Course User Index [AC] Lawanna Kobus   MDM Rules/Calculators/A&P                          45 year old male presenting to ER with concern for right-sided weakness.  Recent admission for large stroke, managed on Coumadin.  In ER, symptoms resolving.  He was a stroke alert, taken emergently to CT with neurology.  Deemed not a TPA candidate given resolving symptoms, large stroke within last 3 months.  INR subtherapeutic.  No LVO.  MRI demonstrated small acute infarct of the left cerebellum and possible acute infarct of the left parietal cortex.  Started on heparin, admitted to medicine.  Consulted cardiology given history of LV thrombus, repeat echo.   Final Clinical Impression(s) / ED Diagnoses Final diagnoses:  Cerebrovascular  accident (CVA), unspecified mechanism [facility])    Rx / DC Orders ED Discharge Orders    None       Lucrezia Starch, MD 09/26/20 [phone]

## 2020-09-25 NOTE — ED Notes (Signed)
cbg 148

## 2020-09-25 NOTE — ED Notes (Signed)
Attempted report x1. 

## 2020-09-25 NOTE — Progress Notes (Signed)
  Echocardiogram 2D Echocardiogram has been performed.  Delcie Roch 09/25/2020, 1:11 PM

## 2020-09-25 NOTE — Consult Note (Addendum)
Cardiology Consultation:   Patient ID: Rodney Kane MRN: [id number]; DOB: 03-20-1976  Admit date: 09/25/2020 Date of Consult: 09/25/2020  PCP:  Azzie Glatter, [facility]  Cardiologist:   Dr. Ellyn Hack Electrophysiologist:  None [id number]    Patient Profile:   Rodney Kane is a 45 y.o. male with a hx of COVID-19 with resultant severe LV function, CVA and thrombus in December 2021 who is being seen today for the evaluation of recurrent CVA in the setting of warfarin anticoagulation at the request of Dr. Sherry Ruffing  History of Present Illness:   [name] has a history of hypertension, diabetes mellitus, and suffered COVID-19 infection in December 2021.  Hospital course was complicated by severe sepsis and he was treated with read them severe and steroids.  At the time he was found to have an EF of 20 to 25% with a small 8 mm diameter apical thrombus.  He was started on warfarin anticoagulation subsequently was started on losartan titrated to 25 mg twice a day and carvedilol 12.5 mg twice a day.  At time of his stroke on December 30 he was admitted for expressive aphasia, staggering gait.  There are issues with potential compliance although the patient states he has been fairly regular and using his warfarin and recently his level was high leading to dose reduction.  He was seen by Kerin Ransom, PA in our office on September 05, 2020 and apparently was also seen by Dr. Ellyn Hack on for every 25 2022.  Anticoagulation visit February 25 INR was subtherapeutic at 1.5.  Today the patient awakened and noticed more right arm weakness than previously.  He was admitted by the internal medicine service.  He was evaluated by neurology today.  Head CT showed large left posterior chronic MCA stroke which was stable without any acute intracranial process.  MRI of the brain showed multiple acute punctate strokes.  A 2D echo Doppler study day shows severe LV dysfunction  with EF estimated at < 20%, with grade 3 diastolic dysfunction consistent with restrictive physiology.  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  Presently, patient denies chest pain.  He has variance exertional dyspnea.  He is unaware of any tachycardia dysrhythmias presyncope or syncope.   Past Medical History:  Diagnosis Date  . Coronavirus infection 06/2020  . COVID 06/2020  . Diabetes mellitus without complication ([place])   . Hemoglobin A1C greater than 9%, indicating poor diabetic control 12/2019  . Hyperglycemia 12/2019  . Hyperlipidemia 12/2019  . Hypertension   . Hypertensive urgency 12/2019  . Noncompliance with medication regimen 12/2019  . Stroke ([place])   . Urine ketones 12/2019  . Vitamin D deficiency 12/2019    Past Surgical History:  Procedure Laterality Date  . left knee surgery    . RIGHT/LEFT HEART CATH AND CORONARY ANGIOGRAPHY N/A 01/05/2017   Procedure: Right/Left Heart Cath and Coronary Angiography;  Surgeon: Leonie Man, MD;  Location: [facility];  Service: Cardiovascular;  Laterality: N/A;     Home Medications:  Prior to Admission medications   Medication Sig Start Date End Date Taking? Authorizing Provider  albuterol (VENTOLIN HFA) 108 (90 Base) MCG/ACT inhaler Inhale 2 puffs into the lungs every 6 (six) hours as needed for wheezing or shortness of breath. 09/19/20  Yes Azzie Glatter, FNP  atorvastatin (LIPITOR) 10 MG tablet Take 1 tablet (10 mg total) by mouth daily.  01/05/20  Yes Azzie Glatter, FNP  blood glucose meter kit and supplies Dispense based on patient and insurance preference. Use up to four times daily for blood glucose readings greater than 125. 09/19/20  Yes Azzie Glatter, FNP  carvedilol (COREG) 12.5 MG tablet Take 1 tablet (12.5 mg total) by mouth 2 (two) times daily with a meal. 08/21/20  Yes Azzie Glatter, FNP  furosemide (LASIX) 40 MG tablet Take 80 mg  ( 2 tablets of 40  mg) every other day with 40 mg (1 tablet) the opposite day 09/14/20  Yes Leonie Man, MD  glipiZIDE (GLUCOTROL) 10 MG tablet Take 1 tablet (10 mg total) by mouth 2 (two) times daily before a meal. 09/17/20  Yes Azzie Glatter, FNP  hydrocortisone cream 1 % Apply 1 application topically 2 (two) times daily. 09/19/20  Yes Azzie Glatter, FNP  losartan (COZAAR) 50 MG tablet Take 1 tablet (50 mg total) by mouth 2 (two) times daily. 09/14/20 12/13/20 Yes Leonie Man, MD  polyethylene glycol powder [facility]) 17 GM/SCOOP powder Take 17 g by mouth daily. 08/29/20  Yes Azzie Glatter, FNP  potassium chloride SA (KLOR-CON) 20 MEQ tablet Take 1 tablet (20 mEq total) by mouth daily. 09/05/20  Yes Kilroy, Luke K, PA-C  sitaGLIPtin (JANUVIA) 25 MG tablet Take 1 tablet (25 mg total) by mouth daily. 09/17/20  Yes Azzie Glatter, FNP  Vitamin D, Ergocalciferol, (DRISDOL) 1.25 MG (50000 UNIT) CAPS capsule Take 1 capsule (50,000 Units total) by mouth every 7 (seven) days. Patient taking differently: Take 50,000 Units by mouth every 7 (seven) days. Mondays 01/05/20  Yes Azzie Glatter, FNP  warfarin (COUMADIN) 5 MG tablet Take 1 tablet (5 mg total) by mouth daily at 4 PM. 07/28/20  Yes Samuella Cota, MD  cetirizine (ZYRTEC ALLERGY) 10 MG tablet Take 1 tablet (10 mg total) by mouth daily. Patient not taking: Reported on 09/25/2020 09/19/20   Azzie Glatter, FNP  hydrOXYzine (ATARAX/VISTARIL) 10 MG tablet Take 1 tablet (10 mg total) by mouth 3 (three) times daily as needed. Patient not taking: Reported on 09/25/2020 09/19/20   Azzie Glatter, FNP  insulin aspart (NOVOLOG) 100 UNIT/ML injection Inject 5 Units into the skin 3 (three) times daily with meals. Patient not taking: No sig reported 08/28/20   Azzie Glatter, FNP  insulin detemir (LEVEMIR) 100 UNIT/ML injection Inject 0.1 mLs (10 Units total) into the skin daily. Patient not taking: Reported on 09/25/2020 09/19/20   Azzie Glatter, FNP   spironolactone (ALDACTONE) 25 MG tablet Take 1 tablet (25 mg total) by mouth daily. Patient not taking: No sig reported 09/14/20 12/13/20  Leonie Man, MD  zinc sulfate 220 (50 Zn) MG capsule Take 1 capsule (220 mg total) by mouth daily. Patient not taking: No sig reported 07/28/20   Samuella Cota, MD    Inpatient Medications: Scheduled Meds: . atorvastatin  10 mg Oral Daily  . carvedilol  12.5 mg Oral BID WC  . insulin aspart  0-15 Units Subcutaneous TID WC   Continuous Infusions: . heparin 1,500 Units/hr (09/25/20 1304)   PRN Meds: acetaminophen **OR** acetaminophen  Allergies:   No Known Allergies  Social History:   Social History   Socioeconomic History  . Marital status: Divorced    Spouse name: Not on file  . Number of children: 3  . Years of education: Not on file  . Highest education level: Not on file  Occupational  History  . Not on file  Tobacco Use  . Smoking status: Never Smoker  . Smokeless tobacco: Never Used  Vaping Use  . Vaping Use: Never used  Substance and Sexual Activity  . Alcohol use: No  . Drug use: No  . Sexual activity: Yes  Other Topics Concern  . Not on file  Social History Narrative  . Not on file   Social Determinants of Health   Financial Resource Strain: Not on file  Food Insecurity: No Food Insecurity  . Worried About Charity fundraiser in the Last Year: Never true  . Ran Out of Food in the Last Year: Never true  Transportation Needs: No Transportation Needs  . Lack of Transportation (Medical): No  . Lack of Transportation (Non-Medical): No  Physical Activity: Inactive  . Days of Exercise per Week: 0 days  . Minutes of Exercise per Session: 0 min  Stress: Stress Concern Present  . Feeling of Stress : To some extent  Social Connections: Not on file  Intimate Partner Violence: Not on file    Family History:    Family History  Problem Relation Age of Onset  . Multiple sclerosis Mother   . Diabetic kidney disease  Mother   . Hypertension Mother      ROS:  Please see the history of present illness.  Positive for expressive aphasia No chest Mild shortness of breath with activity Residual weakness from stroke No chest pain All other ROS reviewed and negative.     Physical Exam/Data:   Vitals:   09/25/20 1345 09/25/20 1500 09/25/20 1622 09/25/20 1622  BP: (!) 151/118 (!) 136/97  (!) 138/100  Pulse: 99 (!) 101 95   Resp: (!) 27 (!) 28 16   Temp:      TempSrc:      SpO2: 97% 100% 100%   Weight:      Height:       No intake or output data in the 24 hours ending 09/25/20 1818 Last 3 Weights 09/25/2020 09/19/2020 09/14/2020  Weight (lbs) 293 lb 293 lb 299 lb 3.2 oz  Weight (kg) 132.904 kg 132.904 kg 135.716 kg     Body mass index is 38.66 kg/m.  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  No S3 gallop.  No rub Lungs:  clear to auscultation bilaterally, no wheezing, rhonchi or rales  Abd: soft, nontender, no hepatomegaly  Ext: no edema Musculoskeletal:  No deformities, BUE and BLE strength normal and equal Skin: warm and dry  Neuro: Expressive aphasia mild right arm weakness Psych:  Normal affect   EKG:  The EKG was personally reviewed and demonstrates: Cardiac 1 to 2 bpm.  QS complex anteroseptally, and in lead I and aVL.  Nonspecific T wave inversion V5 and V6. Telemetry:  Telemetry was personally reviewed and demonstrates: Sinus rhythm  Relevant CV Studies:  ECHO 09/25/2020 IMPRESSIONS  1. 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 The apical thrombus  was confirmed with Definity contrast.   . Left ventricular ejection fraction, by estimation, is <20%. The left  ventricle has severely decreased function. The left ventricle demonstrates   regional wall motion abnormalities (see scoring diagram/findings for  description). Left ventricular  diastolic parameters are consistent with Grade III  diastolic dysfunction  (restrictive). There is severe akinesis of the left ventricular,  mid-apical lateral wall, anteroseptal wall and apical segment.  2. The mitral valve is normal in structure. Trivial mitral valve  regurgitation. No evidence of mitral stenosis.  3. The aortic valve is normal in structure. Aortic valve regurgitation is  not visualized. No aortic stenosis is present.  4. There is mildly elevated pulmonary artery systolic pressure.  Laboratory Data:  High Sensitivity Troponin:  No results for input(s): TROPONINIHS in the last 720 hours.   Chemistry Recent Labs  Lab 09/25/20 0655 09/25/20 0700  NA 136 140  K 4.2 4.2  CL 101 100  CO2 27  --   GLUCOSE 152* 151*  BUN 17 21*  CREATININE 1.31* 1.30*  CALCIUM 9.4  --   GFRNONAA >60  --   ANIONGAP 8  --     Recent Labs  Lab 09/25/20 0655  PROT 6.9  ALBUMIN 3.4*  AST 99*  ALT 53*  ALKPHOS 119  BILITOT 1.1   Hematology Recent Labs  Lab 09/25/20 0655 09/25/20 0700  WBC 6.3  --   RBC 6.05*  --   HGB 15.5 17.0  HCT 50.2 50.0  MCV 83.0  --   MCH 25.6*  --   MCHC 30.9  --   RDW 15.9*  --   PLT 264  --    BNPNo results for input(s): BNP, PROBNP in the last 168 hours.  DDimer No results for input(s): DDIMER in the last 168 hours.   Radiology/Studies:  CT Code Stroke CTA Head W/WO contrast  Result Date: 09/25/2020 CLINICAL DATA:  Code stroke. 45 year old male status post posterior left MCA/PCA watershed area infarct in December. Acute onset right arm weakness and drift this morning. EXAM: CT ANGIOGRAPHY HEAD AND NECK CT PERFUSION BRAIN TECHNIQUE: Multidetector CT imaging of the head and neck was performed using the standard protocol during bolus administration of intravenous contrast. Multiplanar CT image reconstructions and MIPs were obtained to  evaluate the vascular anatomy. Carotid stenosis measurements (when applicable) are obtained utilizing NASCET criteria, using the distal internal carotid diameter as the denominator. Multiphase CT imaging of the brain was performed following IV bolus contrast injection. Subsequent parametric perfusion maps were calculated using RAPID software. CONTRAST:  129m OMNIPAQUE IOHEXOL 350 MG/ML SOLN COMPARISON:  Plain head CT 0701 hours today. Brain MRI 07/20/2020 and earlier. CTA head and neck 07/19/2020. FINDINGS: CT Brain Perfusion Findings: ASPECTS: 10 (left hemisphere encephalomalacia). CBF (<30%) Volume: None Perfusion (Tmax>6.0s) volume: 276m but corresponding to the area of encephalomalacia in the posterior left hemisphere, and therefore felt to be artifactual Mismatch Volume: None suspected to be genuine (as above). Other findings: Suboptimal exam, with incompletely included arterial and venous outflow phase (series 701, image 9). CTA NECK Skeleton: No acute osseous abnormality identified. Upper chest: Small bilateral layering pleural effusions similar to the December appearance. Negative visible lung parenchyma, superior mediastinum. Incidental venous contrast reflux at the left thoracic inlet. Other neck: Paravertebral venous contrast reflux greater on the left and nearly extending to the skull base. Negative neck soft tissue contours. Aortic arch: Suboptimal arterial timing today. Three vessel arch configuration with no arch atherosclerosis. Right carotid system: Suboptimal contrast timing, but otherwise stable and negative compared to December. Left carotid system: Suboptimal contrast timing, and obscured left CCA at the thoracic inlet due to dense venous contrast reflux today. But otherwise stable compared to December and negative. Vertebral arteries: Suboptimal contrast timing today. Proximal subclavian arteries and vertebral artery  origins seem to remain normal. Paravertebral venous contrast reflux also  limits V2 segment detail today a especially on the left. Mildly dominant right vertebral artery as before. No stenosis or occlusion is evident in the neck. CTA HEAD Posterior circulation: Suboptimal contrast timing. Distal vertebral arteries and basilar appear patent and stable, mildly dominant right V4. SCA and PCA origins appear stable. Bilateral PCA branches appear stable since December with mild irregularity more apparent in the right P2 segment. Anterior circulation: Both ICA siphons remain patent, with decreased siphon detail today due to contrast timing. Patent carotid termini, MCA and ACA origins. ACA branches are stable and within normal limits. Normal anterior communicating artery. Right MCA M1 segment and bifurcation are patent without stenosis. Right MCA branches are stable. Left MCA M1 segment and bifurcation remain patent. Proximal left MCA branches appear stable since December when allowing for suboptimal contrast timing today. No acute M2 branch occlusion is identified. Venous sinuses: Early contrast timing. Anatomic variants: Mildly dominant right vertebral artery. Review of the MIP images confirms the above findings IMPRESSION: 1. Suboptimal CTA and CTP exams related to contrast timing. No emergent large vessel occlusion identified. No infarct core detected. Erroneously ischemic penumbra on CTP seems to directly correspond to the December left hemisphere infarct. 2. Stable CTA Head and Neck since December when allowing for decreased arterial detail today. 3. Small bilateral layering pleural effusions also similar to the December CTA. Electronically Signed   By: Genevie Ann M.D.   On: 09/25/2020 07:59   CT Code Stroke CTA Neck W/WO contrast  Result Date: 09/25/2020 CLINICAL DATA:  Code stroke. 45 year old male status post posterior left MCA/PCA watershed area infarct in December. Acute onset right arm weakness and drift this morning. EXAM: CT ANGIOGRAPHY HEAD AND NECK CT PERFUSION BRAIN TECHNIQUE:  Multidetector CT imaging of the head and neck was performed using the standard protocol during bolus administration of intravenous contrast. Multiplanar CT image reconstructions and MIPs were obtained to evaluate the vascular anatomy. Carotid stenosis measurements (when applicable) are obtained utilizing NASCET criteria, using the distal internal carotid diameter as the denominator. Multiphase CT imaging of the brain was performed following IV bolus contrast injection. Subsequent parametric perfusion maps were calculated using RAPID software. CONTRAST:  156m OMNIPAQUE IOHEXOL 350 MG/ML SOLN COMPARISON:  Plain head CT 0701 hours today. Brain MRI 07/20/2020 and earlier. CTA head and neck 07/19/2020. FINDINGS: CT Brain Perfusion Findings: ASPECTS: 10 (left hemisphere encephalomalacia). CBF (<30%) Volume: None Perfusion (Tmax>6.0s) volume: 25m but corresponding to the area of encephalomalacia in the posterior left hemisphere, and therefore felt to be artifactual Mismatch Volume: None suspected to be genuine (as above). Other findings: Suboptimal exam, with incompletely included arterial and venous outflow phase (series 701, image 9). CTA NECK Skeleton: No acute osseous abnormality identified. Upper chest: Small bilateral layering pleural effusions similar to the December appearance. Negative visible lung parenchyma, superior mediastinum. Incidental venous contrast reflux at the left thoracic inlet. Other neck: Paravertebral venous contrast reflux greater on the left and nearly extending to the skull base. Negative neck soft tissue contours. Aortic arch: Suboptimal arterial timing today. Three vessel arch configuration with no arch atherosclerosis. Right carotid system: Suboptimal contrast timing, but otherwise stable and negative compared to December. Left carotid system: Suboptimal contrast timing, and obscured left CCA at the thoracic inlet due to dense venous contrast reflux today. But otherwise stable compared to  December and negative. Vertebral arteries: Suboptimal contrast timing today. Proximal subclavian arteries and vertebral artery origins seem to remain  normal. Paravertebral venous contrast reflux also limits V2 segment detail today a especially on the left. Mildly dominant right vertebral artery as before. No stenosis or occlusion is evident in the neck. CTA HEAD Posterior circulation: Suboptimal contrast timing. Distal vertebral arteries and basilar appear patent and stable, mildly dominant right V4. SCA and PCA origins appear stable. Bilateral PCA branches appear stable since December with mild irregularity more apparent in the right P2 segment. Anterior circulation: Both ICA siphons remain patent, with decreased siphon detail today due to contrast timing. Patent carotid termini, MCA and ACA origins. ACA branches are stable and within normal limits. Normal anterior communicating artery. Right MCA M1 segment and bifurcation are patent without stenosis. Right MCA branches are stable. Left MCA M1 segment and bifurcation remain patent. Proximal left MCA branches appear stable since December when allowing for suboptimal contrast timing today. No acute M2 branch occlusion is identified. Venous sinuses: Early contrast timing. Anatomic variants: Mildly dominant right vertebral artery. Review of the MIP images confirms the above findings IMPRESSION: 1. Suboptimal CTA and CTP exams related to contrast timing. No emergent large vessel occlusion identified. No infarct core detected. Erroneously ischemic penumbra on CTP seems to directly correspond to the December left hemisphere infarct. 2. Stable CTA Head and Neck since December when allowing for decreased arterial detail today. 3. Small bilateral layering pleural effusions also similar to the December CTA. Electronically Signed   By: Genevie Ann M.D.   On: 09/25/2020 07:59   MR BRAIN WO CONTRAST  Result Date: 09/25/2020 CLINICAL DATA:  Acute neuro deficit.  Right arm weakness  and drift. EXAM: MRI HEAD WITHOUT CONTRAST TECHNIQUE: Multiplanar, multiecho pulse sequences of the brain and surrounding structures were obtained without intravenous contrast. COMPARISON:  MRI head 07/20/2020 FINDINGS: Brain: 5 mm focus of restricted diffusion left cerebellum compatible with acute infarct. Small area of diffusion hyperintensity in the left parietal cortex possibly acute infarct. Chronic hemorrhagic infarct in the left occipital parietal lobe. This showed restricted diffusion on the prior study. Mild white matter changes bilaterally. Chronic microhemorrhage in the left basal ganglia. Correlate with hypertension history. Ventricle size normal. Vascular: Normal arterial flow voids Skull and upper cervical spine: No focal skeletal abnormality. Sinuses/Orbits: Paranasal sinuses clear.  Negative orbit Other: None IMPRESSION: Small acute infarct left cerebellum. Possible acute infarct left parietal cortex Chronic hemorrhagic infarct left occipital parietal lobe Chronic microhemorrhage left basal ganglia. Correlate with hypertension history Electronically Signed   By: Franchot Gallo M.D.   On: 09/25/2020 11:35   CT Code Stroke Cerebral Perfusion with contrast  Result Date: 09/25/2020 CLINICAL DATA:  Code stroke. 45 year old male status post posterior left MCA/PCA watershed area infarct in December. Acute onset right arm weakness and drift this morning. EXAM: CT ANGIOGRAPHY HEAD AND NECK CT PERFUSION BRAIN TECHNIQUE: Multidetector CT imaging of the head and neck was performed using the standard protocol during bolus administration of intravenous contrast. Multiplanar CT image reconstructions and MIPs were obtained to evaluate the vascular anatomy. Carotid stenosis measurements (when applicable) are obtained utilizing NASCET criteria, using the distal internal carotid diameter as the denominator. Multiphase CT imaging of the brain was performed following IV bolus contrast injection. Subsequent parametric  perfusion maps were calculated using RAPID software. CONTRAST:  171m OMNIPAQUE IOHEXOL 350 MG/ML SOLN COMPARISON:  Plain head CT 0701 hours today. Brain MRI 07/20/2020 and earlier. CTA head and neck 07/19/2020. FINDINGS: CT Brain Perfusion Findings: ASPECTS: 10 (left hemisphere encephalomalacia). CBF (<30%) Volume: None Perfusion (Tmax>6.0s) volume: 21m but corresponding  to the area of encephalomalacia in the posterior left hemisphere, and therefore felt to be artifactual Mismatch Volume: None suspected to be genuine (as above). Other findings: Suboptimal exam, with incompletely included arterial and venous outflow phase (series 701, image 9). CTA NECK Skeleton: No acute osseous abnormality identified. Upper chest: Small bilateral layering pleural effusions similar to the December appearance. Negative visible lung parenchyma, superior mediastinum. Incidental venous contrast reflux at the left thoracic inlet. Other neck: Paravertebral venous contrast reflux greater on the left and nearly extending to the skull base. Negative neck soft tissue contours. Aortic arch: Suboptimal arterial timing today. Three vessel arch configuration with no arch atherosclerosis. Right carotid system: Suboptimal contrast timing, but otherwise stable and negative compared to December. Left carotid system: Suboptimal contrast timing, and obscured left CCA at the thoracic inlet due to dense venous contrast reflux today. But otherwise stable compared to December and negative. Vertebral arteries: Suboptimal contrast timing today. Proximal subclavian arteries and vertebral artery origins seem to remain normal. Paravertebral venous contrast reflux also limits V2 segment detail today a especially on the left. Mildly dominant right vertebral artery as before. No stenosis or occlusion is evident in the neck. CTA HEAD Posterior circulation: Suboptimal contrast timing. Distal vertebral arteries and basilar appear patent and stable, mildly dominant  right V4. SCA and PCA origins appear stable. Bilateral PCA branches appear stable since December with mild irregularity more apparent in the right P2 segment. Anterior circulation: Both ICA siphons remain patent, with decreased siphon detail today due to contrast timing. Patent carotid termini, MCA and ACA origins. ACA branches are stable and within normal limits. Normal anterior communicating artery. Right MCA M1 segment and bifurcation are patent without stenosis. Right MCA branches are stable. Left MCA M1 segment and bifurcation remain patent. Proximal left MCA branches appear stable since December when allowing for suboptimal contrast timing today. No acute M2 branch occlusion is identified. Venous sinuses: Early contrast timing. Anatomic variants: Mildly dominant right vertebral artery. Review of the MIP images confirms the above findings IMPRESSION: 1. Suboptimal CTA and CTP exams related to contrast timing. No emergent large vessel occlusion identified. No infarct core detected. Erroneously ischemic penumbra on CTP seems to directly correspond to the December left hemisphere infarct. 2. Stable CTA Head and Neck since December when allowing for decreased arterial detail today. 3. Small bilateral layering pleural effusions also similar to the December CTA. Electronically Signed   By: Genevie Ann M.D.   On: 09/25/2020 07:59   CT HEAD CODE STROKE WO CONTRAST  Result Date: 09/25/2020 CLINICAL DATA:  Code stroke. 46 year old male status post posterior left MCA/PCA watershed area infarct in December. Acute onset right arm weakness and drift this morning. EXAM: CT HEAD WITHOUT CONTRAST TECHNIQUE: Contiguous axial images were obtained from the base of the skull through the vertex without intravenous contrast. COMPARISON:  Brain MRI 07/20/2020.  Head CT 07/19/2020. FINDINGS: Brain: Confluent hypodensity in the posterior left temporal and lateral occipital lobes corresponds to diffusion abnormality in December and is  compatible with post ischemic encephalomalacia. Mild asymmetry of the lateral ventricles is stable, with no midline shift or intracranial mass effect evident. Gray-white matter differentiation elsewhere is stable. No acute intracranial hemorrhage identified. No acute cortically based infarct identified. Vascular: No suspicious intracranial vascular hyperdensity. Skull: No acute osseous abnormality identified. Sinuses/Orbits: Visualized paranasal sinuses and mastoids are stable and well pneumatized. Other: Stable orbit and scalp soft tissues. ASPECTS [facility] Stroke Program Early CT Score) Total score (0-10 with 10 being normal):  10 (chronic left hemisphere encephalomalacia) IMPRESSION: 1. Expected CT appearance of the left hemisphere infarct since December with encephalomalacia. No acute intracranial abnormality identified. ASPECTS 10. 2. These results were communicated to Dr. Curly Shores at 7:11 am on 09/25/2020 by text page via the [facility] messaging system. Electronically Signed   By: Genevie Ann M.D.   On: 09/25/2020 07:12   ECHOCARDIOGRAM LIMITED  Result Date: 09/25/2020    ECHOCARDIOGRAM LIMITED REPORT   Patient Name:   JES COSTALES Date of Exam: 09/25/2020 Medical Rec #:  [id number]             Height:       73.0 in Accession #:    [id number]            Weight:       293.0 lb Date of Birth:  [birth year]             BSA:          2.531 m Patient Age:    89 years              BP:           131/110 mmHg Patient Gender: M                     HR:           98 bpm. Exam Location:  Inpatient Procedure: Limited Echo, Color Doppler, Cardiac Doppler and Intracardiac            Opacification Agent STAT ECHO Indications:    Stroke  History:        Patient has prior history of Echocardiogram examinations, most                 recent 07/20/2020. Covid Positive; Risk Factors:Hypertension,                 Dyslipidemia and Diabetes.  Sonographer:    Johny Chess Referring Phys: [id number] [place]  1. There is  a large apical mural thrombus in the LV . The apical thrombus was confirmed with Definity contrast.     . Left ventricular ejection fraction, by estimation, is <20%. The left ventricle has severely decreased function. The left ventricle demonstrates regional wall motion abnormalities (see scoring diagram/findings for description). Left ventricular diastolic parameters are consistent with Grade III diastolic dysfunction (restrictive). There is severe akinesis of the left ventricular, mid-apical lateral wall, anteroseptal wall and apical segment.  2. The mitral valve is normal in structure. Trivial mitral valve regurgitation. No evidence of mitral stenosis.  3. The aortic valve is normal in structure. Aortic valve regurgitation is not visualized. No aortic stenosis is present.  4. There is mildly elevated pulmonary artery systolic pressure. 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 The apical thrombus was confirmed with Definity contrast. Left ventricular ejection fraction, by estimation, is <20%. The left ventricle has severely decreased function. The left ventricle demonstrates regional wall motion abnormalities. Severe akinesis of the left ventricular, mid-apical lateral wall, anteroseptal wall and apical segment. Definity contrast agent was given IV to delineate the left ventricular endocardial borders. Left ventricular diastolic parameters are consistent with Grade III diastolic dysfunction (restrictive). Right Ventricle: There is mildly elevated pulmonary artery systolic pressure. The tricuspid regurgitant velocity is 2.61 m/s, and with an assumed right atrial pressure of 15 mmHg, the estimated right ventricular systolic pressure is 38.7 mmHg. Mitral Valve: The mitral valve  is normal in structure. Trivial mitral valve regurgitation. No evidence of mitral valve stenosis. Tricuspid Valve: The tricuspid valve is normal in structure. Tricuspid valve regurgitation is mild. Aortic  Valve: The aortic valve is normal in structure. Aortic valve regurgitation is not visualized. No aortic stenosis is present. Pulmonic Valve: The pulmonic valve was normal in structure. Pulmonic valve regurgitation is not visualized. LEFT VENTRICLE PLAX 2D LVIDd:         6.80 cm LVIDs:         5.70 cm LV PW:         1.10 cm LV IVS:        1.00 cm LVOT diam:     2.30 cm LV SV:         28 LV SV Index:   11 LVOT Area:     4.15 cm  IVC IVC diam: 2.80 cm LEFT ATRIUM         Index LA diam:    4.40 cm 1.74 cm/m  AORTIC VALVE LVOT Vmax:   47.00 cm/s LVOT Vmean:  31.200 cm/s LVOT VTI:    0.067 m TRICUSPID VALVE TR Peak grad:   27.2 mmHg TR Vmax:        261.00 cm/s  SHUNTS Systemic VTI:  0.07 m Systemic Diam: 2.30 cm Mertie Moores MD Electronically signed by Mertie Moores MD Signature Date/Time: 09/25/2020/1:23:51 PM    Final      Assessment and Plan:   1. Severe nonischemic cardiomyopathy: Echo Doppler study today shows EF less than 20%.  There is grade 3 diastolic dysfunction consistent with restrictive physiology; mild to moderately elevaated right ventricular systolic pressure at 42 mm. There is evidence for a large apical layered thrombus.  With patient severe LV dysfunction, will continue losartan 25 mg daily but plan to transition to [facility] 24/26 and ultimately titrate up to maximum dosing if at all possible, will add spironolactone 12.5 mg daily, and continue carvedilol 12.5 mg twice a day. Will check BNP I am. 2. Apical thrombus: Patient states he has been taking Coumadin regularly and rarely missed dose.  When recently seen in anticoagulation clinic, INR was subtherapeutic at 1.5.  At present we will continue heparin therapy.  I will discuss with pharmacist regarding DOAC which at present is undergoing clinical investigation for thrombus but are no definitive data. 3. CVA: Followed by neurology with new punctate lesions today and large left posterior chronic MCA, stable without acute intracranial  process. 4. Diabetes mellitus 5. Hyperlipidemia: Currently on atorvastatin.  Target LDL less than 70.  Recheck lipid panel in a.m.    For questions or updates, please contact Catarina Please consult [email or web address] for contact info under    Signed, Shelva Majestic, MD, [facility] 09/25/2020 6:18 PM

## 2020-09-26 ENCOUNTER — Other Ambulatory Visit: Payer: Self-pay | Admitting: Internal Medicine

## 2020-09-26 DIAGNOSIS — I6300 Cerebral infarction due to thrombosis of unspecified precerebral artery: Secondary | ICD-10-CM

## 2020-09-26 DIAGNOSIS — I513 Intracardiac thrombosis, not elsewhere classified: Secondary | ICD-10-CM

## 2020-09-26 DIAGNOSIS — I639 Cerebral infarction, unspecified: Secondary | ICD-10-CM

## 2020-09-26 DIAGNOSIS — I428 Other cardiomyopathies: Secondary | ICD-10-CM

## 2020-09-26 DIAGNOSIS — E785 Hyperlipidemia, unspecified: Secondary | ICD-10-CM

## 2020-09-26 LAB — BASIC METABOLIC PANEL
Anion gap: 13 (ref 5–15)
BUN: 15 mg/dL (ref 6–20)
CO2: 22 mmol/L (ref 22–32)
Calcium: 9.3 mg/dL (ref 8.9–10.3)
Chloride: 102 mmol/L (ref 98–111)
Creatinine, Ser: 1.17 mg/dL (ref 0.61–1.24)
GFR, Estimated: >60 mL/min (ref >60)
Glucose, Bld: 112 mg/dL — ABNORMAL HIGH (ref 70–99)
Potassium: 4.4 mmol/L (ref 3.5–5.1)
Sodium: 137 mmol/L (ref 135–145)

## 2020-09-26 LAB — CBC
HCT: 46.7 % (ref 39.0–52.0)
Hemoglobin: 15.1 g/dL (ref 13.0–17.0)
MCH: 26.2 pg (ref 26.0–34.0)
MCHC: 32.3 g/dL (ref 30.0–36.0)
MCV: 81.1 fL (ref 80.0–100.0)
Platelets: 237 10*3/uL (ref 150–400)
RBC: 5.76 MIL/uL (ref 4.22–5.81)
RDW: 15.9 % — ABNORMAL HIGH (ref 11.5–15.5)
WBC: 6.1 10*3/uL (ref 4.0–10.5)
nRBC: 0 % (ref 0.0–0.2)

## 2020-09-26 LAB — LIPID PANEL
Cholesterol: 87 mg/dL (ref 0–200)
HDL: 29 mg/dL — ABNORMAL LOW (ref >40)
LDL Cholesterol: 50 mg/dL (ref 0–99)
Total CHOL/HDL Ratio: 3 RATIO
Triglycerides: 40 mg/dL (ref <150)
VLDL: 8 mg/dL (ref 0–40)

## 2020-09-26 LAB — GLUCOSE, CAPILLARY
Glucose-Capillary: 99 mg/dL (ref 70–99)
Glucose-Capillary: 153 mg/dL — ABNORMAL HIGH (ref 70–99)
Glucose-Capillary: 144 mg/dL — ABNORMAL HIGH (ref 70–99)
Glucose-Capillary: 172 mg/dL — ABNORMAL HIGH (ref 70–99)

## 2020-09-26 LAB — HEPARIN LEVEL (UNFRACTIONATED)
Heparin Unfractionated: <0.1 IU/mL — ABNORMAL LOW (ref 0.30–0.70)
Heparin Unfractionated: <0.1 IU/mL — ABNORMAL LOW (ref 0.30–0.70)

## 2020-09-26 LAB — PROTIME-INR
INR: 1.5 — ABNORMAL HIGH (ref 0.8–1.2)
Prothrombin Time: 17.3 seconds — ABNORMAL HIGH (ref 11.4–15.2)

## 2020-09-26 LAB — BRAIN NATRIURETIC PEPTIDE: B Natriuretic Peptide: 1497.8 pg/mL — ABNORMAL HIGH (ref 0.0–100.0)

## 2020-09-26 MED ORDER — RIVAROXABAN 20 MG PO TABS: 20.0000 mg | ORAL_TABLET | Freq: Every day | ORAL | 30 refills | Status: DC

## 2020-09-26 MED ORDER — RIVAROXABAN 20 MG PO TABS
20.0000 mg | ORAL_TABLET | Freq: Every day | ORAL | Status: DC
  Administered 2020-09-26: 20 mg via ORAL
  Filled 2020-09-26: qty 1

## 2020-09-26 MED ORDER — APIXABAN 5 MG PO TABS: 5.0000 mg | ORAL_TABLET | Freq: Two times a day (BID) | ORAL | 0 refills | Status: DC

## 2020-09-26 MED FILL — XARELTO 20 MG TABLET: 20 | 30 days supply | Qty: 30 | Fill #0

## 2020-09-26 NOTE — Progress Notes (Addendum)
Per d/w pharmD Georgina Pillion, care management consult in place to investigate options for Eliquis affordability which will help finalize plans for transition to [facility] tomorrow. She had discussed with neurology (Dr. Wilford Corner) who is on board with DOAC and okay to make that transition whenever.   Addendum: see updated pharmD note above. Eliquis too costly. D/w Dr. Anne Fu. OK to use Xarelto instead.

## 2020-09-26 NOTE — Progress Notes (Addendum)
Xarelto Copay Card - given to patient

## 2020-09-26 NOTE — Progress Notes (Signed)
SLP Cancellation Note  Patient Details Name: Llewyn Heap MRN: [id number] DOB: Mar 29, 1976   Cancelled treatment:       Reason Eval/Treat Not Completed: RN screened swallow with [facility] protocol, no needs identified, will sign off.    Kylene Zamarron, Riley Nearing 09/26/2020, 7:49 AM

## 2020-09-26 NOTE — Progress Notes (Addendum)
STROKE TEAM PROGRESS NOTE   INTERVAL HISTORY No acute events overnight  Denies new concerns. Reports right arm weakness has resolved. He feels his speech is getting better over time.  We discussed stroke diagnosis and plan of care. He shared that he has had so many medical appointments he could not keep them straight and did forget to take his warfarin recently. He lives with his 46 y.o. son who he believes will be willing to assist in helping him remember to get to appts and take his medications. We discussed setting alarm on his smart phone for taking stroke prevention medication. He agrees to try this.    Reports missing at least 2 doses of Coumadin.  Vitals:   09/25/20 2022 09/26/20 0010 09/26/20 0427 09/26/20 1629  BP: (!) 142/102 (!) 138/100 (!) 136/104 118/89  Pulse: 100 96 97 89  Resp: 18 19 20 16   Temp: 97.9 F (36.6 C) 98.4 F (36.9 C) 98.7 F (37.1 C) 98.4 F (36.9 C)  TempSrc: Oral Oral Oral Oral  SpO2: 100% 98% 100% 100%  Weight:      Height:       CBC:  Recent Labs  Lab 09/25/20 0655 09/25/20 0700 09/26/20 0327  WBC 6.3  --  6.1  NEUTROABS 2.8  --   --   HGB 15.5 17.0 15.1  HCT 50.2 50.0 46.7  MCV 83.0  --  81.1  PLT 264  --  237   Basic Metabolic Panel:  Recent Labs  Lab 09/25/20 0655 09/25/20 0700 09/26/20 0327  NA 136 140 137  K 4.2 4.2 4.4  CL 101 100 102  CO2 27  --  22  GLUCOSE 152* 151* 112*  BUN 17 21* 15  CREATININE 1.31* 1.30* 1.17  CALCIUM 9.4  --  9.3   Lipid Panel:  Recent Labs  Lab 09/26/20 0327  CHOL 87  TRIG 40  HDL 29*  CHOLHDL 3.0  VLDL 8  LDLCALC 50   HgbA1c:  Recent Labs  Lab 09/25/20 1426  HGBA1C 7.9*   Urine Drug Screen: No results for input(s): LABOPIA, COCAINSCRNUR, LABBENZ, AMPHETMU, THCU, LABBARB in the last 168 hours.  Alcohol Level No results for input(s): ETH in the last 168 hours.  IMAGING past 24 hours No results found.  PHYSICAL EXAM General: Obese male lying in bed in NAD Psych: Calm and  cooperative Resp: No extra work of breathing  Mental Status: Patient is awake, alert, oriented to person, place, month, year, day of the week and situation. Patient is able to give history within speech related limitations. Speaks slowly with halting speech that is repetitive/perseverating at times. Clearly spoken.  Able to repeat 3 word phrase, able to repeat 5 word sentence with 3 tries. Names 3/3 without difficulty.  Cranial Nerves: II: Visual Fields are full. Pupils are equal, round, and reactive to light.   III,IV, VI: EOMI without ptosis or diploplia.  V: Facial sensation is symmetric to light touch VII: Facial movement initially notable for mild right facial droop, then symmetric VIII: hearing is intact to voice XII: tongue is midline without atrophy or fasciculations.  Motor: Tone is normal. Bulk is normal. Bilateral UE full strength throughout. No pronator drift.  Sensory: Sensation is symmetric to light touch in bilat UE and LE  Plantars: Toes are downgoing bilaterally.  Cerebellar: RAM of bilat UE  and HKS are intact bilaterally  ASSESSMENT/PLAN Rodney Kane is a 45 y.o. 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 Compliance issuesand financial difficulties with affording medications have been documented. He presented with numbness and weakness of his right arm perhaps around 6 AM on 3/8. He reported missing one warfarin dose recently.  To the attending, reported missing couple of doses.  Stroke Small acute infarct left cerebellum. Possible acute infarct left parietal cortex.Chronic hemorrhagic infarct left occipital parietal lobe. Chronic microhemorrhage left basal ganglia due to  cardioembolic source in setting of known apical mural thrombus with subtherapeutic INR on warfarin with severe nonischemic cardiomyopathy. Possible medication noncompliance as well.  Head CT showed large left posterior chronic MCA stroke, stable without any acute intracranial process.  CTA/CTP suboptimal given his low EF, but there is no acute large vessel occlusion or tissue at risk, area identified by RAPID software corresponds to old stroke  MRI: Small acute infarct left cerebellum. Possible acute infarct left parietal cortex. Chronic hemorrhagic infarct left occipital parietal lobe. Chronic microhemorrhage left basal ganglia.   - ECHO shows large apical mural thrombus in the LV. Left ventricular ejection fraction, by estimation, is <20% The left ventricle has severely decreased function. The left ventricle demonstrates regional wall motion abnormalities. Left ventricular  diastolic parameters are consistent with Grade III diastolic dysfunction. There is severe akinesis of the left ventricular, mid-apical lateral wall, anteroseptal wall and apical segment.     LDL 50  HgbA1c 7.9  VTE prophylaxis - on heparin gtt    Diet   Diet heart healthy/carb modified Room service appropriate? Yes; Fluid consistency: Thin   On warfarin prior to admission.   Anticoagulation is recommended: Heparin drip given high index of suspicion for persistent LV thrombus with new acute strokes on MRI that are punctate and therefore low risk of hemorrhagic conversion despite acuity; risks are outweighed by benefit. Cardiology consult and pharmacy assistance appreciated re: DOAC candidacy given persistent subtherapeutic INRs. His plan does not cover Eliquis but will cover Xarelto with a $100 co-pay. Case management following to address affordability issues. Will need close follow up with PCP to assess compliance with discharge regimen.    Therapy recommendations:  Pending   Disposition:   TBD  Hypertension . Blood pressure goal is normotension, no permissive hypertension . Management per primary team  Severe non-ischemic cardiomyopathy . EF less than 20% on repeat Echo  . Management per primary team with cardiology assistance  Hyperlipidemia  LDL 50 is at goal < 70  Lipitor 40mg  on board   Continue statin at discharge  Diabetes type II Uncontrolled   HgbA1c 7.9, goal < 7.0  CBGs Recent Labs    09/26/20 0611 09/26/20 1241 09/26/20 1628  GLUCAP 99 153* 144*      SSI  Management per primary team  Close follow up with PCP   Other Stroke Risk Factors  Obesity, Body mass index is 38.66 kg/m., BMI >/= 30 associated with increased stroke risk, recommend weight loss, diet and exercise as appropriate   Hx stroke/TIA  Congestive heart failure  Other problems: Covid + on admission. This is considered a holdover positivity from prior infection.  Hospital day # 1   To contact Stroke Continuity provider, please refer to 11/26/20. After hours, contact General Neurology   Attending addendum Patient seen and examined independently Imaging reviewed personally Punctate embolic-looking infarcts in the setting of LV thrombus  and subtherapeutic INR. Ideally should be on Coumadin but given noncompliance, would benefit from a DOAC.  Eliquis was recommended but cost prohibitive for the patient.  In conjunction with cardiology consultation, he will be on Xarelto, which still has a pretty expensive co-pay-need to ensure that he is going to be taking this. Repeat echocardiogram with similar findings to the last echocardiogram with possibly some worsening-related to poor outpatient compliance. From a structural pension standpoint, essentially, this patient needs to be compliant to his anticoagulation.  In the absence of consistent anticoagulation, he would be at high risk for embolic events in the future as well. This was discussed in detail with the  patient. Blood pressure parameters above Stressed medication compliance  Anticoagulation timing-usually we would wait a few days before anticoagulating but given his strokes being a result of the LV thrombus and noncompliance to anticoagulation, would recommend anticoagulation right away.  His heparin drip can be switched to the DOAC whenever okay with the primary team.  Discussed the plan with cardiology over secure chat  Stroke neurology will sign off.  We will be available as needed.   -- Milon Dikes, MD Stroke Neurology Pager: [phone]

## 2020-09-26 NOTE — Plan of Care (Signed)

## 2020-09-26 NOTE — Progress Notes (Signed)
ANTICOAGULATION CONSULT NOTE  Pharmacy Consult:  Heparin>>xarelto Indication: persistent LV thrombus   Labs: Recent Labs    09/25/20 0655 09/25/20 0700 09/25/20 1851 09/26/20 0327 09/26/20 1043  HGB 15.5 17.0  --  15.1  --   HCT 50.2 50.0  --  46.7  --   PLT 264  --   --  237  --   APTT 31  --   --   --   --   LABPROT 15.9*  --   --  17.3*  --   INR 1.3*  --   --  1.5*  --   HEPARINUNFRC  --   --  <0.10* <0.10* <0.10*  CREATININE 1.31* 1.30*  --  1.17  --     Assessment: Pt is a 44 yom with a hx of LV thrombus (on warfarin 5 mg daily PTA w/ INR goal 2-2.5) presenting with new acute strokes on MRI.  INR on admission subtherapeutic at 1.3 and pt admits to missing at least one dose.  Infarctions noted on MRI. Pharmacy consulted for IV heparin dosing.  Heparin level remains <0.1 units/ml.  No issues noted with infusion per RN.  Heparin level been an issue with being subtherapeutic possibly due to lines. After discussing the [facility] pharmacy, it was determined that his plan doesn't cover apixaban but rather Xarelto. The copay would be $100/mo but he does have commercial insurance so he can use the Copay Card to reduce it to $10/month. D/w Ronie Spies from cards and Dr. Anne Fu is ok with transition to PO Xarelto.  Goal of Therapy:  Heparin level 0.3-0.5 units/ml Monitor platelets by anticoagulation protocol: Yes   Plan:  Dc heparin Xarelto 20mg  PO qday  , PharmD, [place], AAHIVP, CPP Infectious Disease Pharmacist 09/26/2020 2:58 PM

## 2020-09-26 NOTE — TOC Benefit Eligibility Note (Signed)
Transition of Care [facility]) Benefit Eligibility Note    Patient Details  Name: Rodney Kane MRN: [id number] Date of Birth: 1975/11/03   Medication/Dose: Everlene Balls  5 MG BID  Covered?: No     Prescription Coverage [facility]  Spoke with Person/Company/Phone Number:: HISELA  @ [facility] RX # [phone]     Prior Approval: Yes (# [id number])     Additional Notes: PREFERRED : WARFARIN 5 MG BID COVER- YES CO-PAY-  $ 3.90  and XARELTO 15 MG BID COVER-YES  CO-PAY- $100.00  P/A-NO    Mardene Sayer Phone Number: 09/26/2020, 4:07 PM

## 2020-09-26 NOTE — Progress Notes (Signed)
ANTICOAGULATION CONSULT NOTE  Pharmacy Consult:  Heparin Indication: persistent LV thrombus   Labs: Recent Labs    09/25/20 0655 09/25/20 0700 09/25/20 1851 09/26/20 0327  HGB 15.5 17.0  --   --   HCT 50.2 50.0  --   --   PLT 264  --   --   --   APTT 31  --   --   --   LABPROT 15.9*  --   --  17.3*  INR 1.3*  --   --  1.5*  HEPARINUNFRC  --   --  <0.10* <0.10*  CREATININE 1.31* 1.30*  --  1.17    Assessment: Pt is a 44 yom with a hx of LV thrombus (on warfarin 5 mg daily PTA w/ INR goal 2-2.5) presenting with new acute strokes on MRI.  INR on admission subtherapeutic at 1.3 and pt admits to missing at least one dose.  Infarctions noted on MRI. Pharmacy consulted for IV heparin dosing.  Heparin level remains <0.1 units/ml.  No issues noted with infusion per RN.  Goal of Therapy:  Heparin level 0.3-0.5 units/ml Monitor platelets by anticoagulation protocol: Yes   Plan:  Increase heparin infusion to 1800 units/hr Check 6 hr heparin level  Thanks for allowing pharmacy to be a part of this patient's care.  Talbert Cage, PharmD Clinical Pharmacist  09/26/2020, 4:43 AM

## 2020-09-26 NOTE — Progress Notes (Addendum)
Progress Note  Patient Name: Rodney Kane Date of Encounter: 09/26/2020  Primary Cardiologist: [name], MD  Subjective   Denies chest pain, SOB, palpitations, feeling well this morning. Reports taking cardiac meds prior to admission. Some word finding difficulty noted which apparently is a holdover from last stroke. He reports he's had a cough since Covid in December.  Noted to be Covid + on admission. IM notes pending. Per d/w nurse and resident team, this is considered a holdover positivity from prior infection.  Inpatient Medications    Scheduled Meds: . atorvastatin  40 mg Oral Daily  . carvedilol  12.5 mg Oral BID WC  . insulin aspart  0-15 Units Subcutaneous TID WC  . losartan  25 mg Oral Daily  . spironolactone  12.5 mg Oral Daily   Continuous Infusions: . heparin 1,800 Units/hr (09/26/20 0452)   PRN Meds: acetaminophen **OR** acetaminophen   Vital Signs    Vitals:   09/25/20 1822 09/25/20 2022 09/26/20 0010 09/26/20 0427  BP: (!) 141/106 (!) 142/102 (!) 138/100 (!) 136/104  Pulse: 100 100 96 97  Resp: 18 18 19 20   Temp: 97.8 F (36.6 C) 97.9 F (36.6 C) 98.4 F (36.9 C) 98.7 F (37.1 C)  TempSrc: Oral Oral Oral Oral  SpO2: 99% 100% 98% 100%  Weight:      Height:        Intake/Output Summary (Last 24 hours) at 09/26/2020 1218 Last data filed at 09/26/2020 0600 Gross per 24 hour  Intake 262.22 ml  Output 200 ml  Net 62.22 ml   Last 3 Weights 09/25/2020 09/19/2020 09/14/2020  Weight (lbs) 293 lb 293 lb 299 lb 3.2 oz  Weight (kg) 132.904 kg 132.904 kg 135.716 kg     Telemetry    NSR, rare ectopy - difficult to discern given 2nd lead off during these times- Personally Reviewed   Physical Exam   GEN: No acute distress.  HEENT: Normocephalic, atraumatic, sclera non-icteric. Neck: No JVD or bruits. Cardiac: RRR no murmurs, rubs, or gallops.  Respiratory: Clear to auscultation bilaterally. Breathing is unlabored. GI: Soft, nontender,  non-distended, BS +x 4. MS: no deformity. Extremities: No clubbing or cyanosis. No edema. Distal pedal pulses are 2+ and equal bilaterally. Neuro:  AAOx3. Mild word finding difficulty. Psych:  Responds to questions appropriately with a normal affect.  Labs    High Sensitivity Troponin:  No results for input(s): TROPONINIHS in the last 720 hours.    Cardiac EnzymesNo results for input(s): TROPONINI in the last 168 hours. No results for input(s): TROPIPOC in the last 168 hours.   Chemistry Recent Labs  Lab 09/25/20 0655 09/25/20 0700 09/26/20 0327  NA 136 140 137  K 4.2 4.2 4.4  CL 101 100 102  CO2 27  --  22  GLUCOSE 152* 151* 112*  BUN 17 21* 15  CREATININE 1.31* 1.30* 1.17  CALCIUM 9.4  --  9.3  PROT 6.9  --   --   ALBUMIN 3.4*  --   --   AST 99*  --   --   ALT 53*  --   --   ALKPHOS 119  --   --   BILITOT 1.1  --   --   GFRNONAA >60  --  >60  ANIONGAP 8  --  13     Hematology Recent Labs  Lab 09/25/20 0655 09/25/20 0700 09/26/20 0327  WBC 6.3  --  6.1  RBC 6.05*  --  5.76  HGB 15.5 17.0 15.1  HCT 50.2 50.0 46.7  MCV 83.0  --  81.1  MCH 25.6*  --  26.2  MCHC 30.9  --  32.3  RDW 15.9*  --  15.9*  PLT 264  --  237    BNPNo results for input(s): BNP, PROBNP in the last 168 hours.   DDimer No results for input(s): DDIMER in the last 168 hours.   Radiology    CT Code Stroke CTA Head W/WO contrast  Result Date: 09/25/2020 CLINICAL DATA:  Code stroke. 45 year old male status post posterior left MCA/PCA watershed area infarct in December. Acute onset right arm weakness and drift this morning. EXAM: CT ANGIOGRAPHY HEAD AND NECK CT PERFUSION BRAIN TECHNIQUE: Multidetector CT imaging of the head and neck was performed using the standard protocol during bolus administration of intravenous contrast. Multiplanar CT image reconstructions and MIPs were obtained to evaluate the vascular anatomy. Carotid stenosis measurements (when applicable) are obtained utilizing NASCET  criteria, using the distal internal carotid diameter as the denominator. Multiphase CT imaging of the brain was performed following IV bolus contrast injection. Subsequent parametric perfusion maps were calculated using RAPID software. CONTRAST:  OMNIPAQUE IOHEXOL 350 MG/ML SOLN COMPARISON:  Plain head CT 0701 hours today. Brain MRI 07/20/2020 and earlier. CTA head and neck 07/19/2020. FINDINGS: CT Brain Perfusion Findings: ASPECTS: 10 (left hemisphere encephalomalacia). CBF (<30%) Volume: None Perfusion (Tmax>6.0s) volume: 6mL, but corresponding to the area of encephalomalacia in the posterior left hemisphere, and therefore felt to be artifactual Mismatch Volume: None suspected to be genuine (as above). Other findings: Suboptimal exam, with incompletely included arterial and venous outflow phase (series 701, image 9). CTA NECK Skeleton: No acute osseous abnormality identified. Upper chest: Small bilateral layering pleural effusions similar to the December appearance. Negative visible lung parenchyma, superior mediastinum. Incidental venous contrast reflux at the left thoracic inlet. Other neck: Paravertebral venous contrast reflux greater on the left and nearly extending to the skull base. Negative neck soft tissue contours. Aortic arch: Suboptimal arterial timing today. Three vessel arch configuration with no arch atherosclerosis. Right carotid system: Suboptimal contrast timing, but otherwise stable and negative compared to December. Left carotid system: Suboptimal contrast timing, and obscured left CCA at the thoracic inlet due to dense venous contrast reflux today. But otherwise stable compared to December and negative. Vertebral arteries: Suboptimal contrast timing today. Proximal subclavian arteries and vertebral artery origins seem to remain normal. Paravertebral venous contrast reflux also limits V2 segment detail today a especially on the left. Mildly dominant right vertebral artery as before. No  stenosis or occlusion is evident in the neck. CTA HEAD Posterior circulation: Suboptimal contrast timing. Distal vertebral arteries and basilar appear patent and stable, mildly dominant right V4. SCA and PCA origins appear stable. Bilateral PCA branches appear stable since December with mild irregularity more apparent in the right P2 segment. Anterior circulation: Both ICA siphons remain patent, with decreased siphon detail today due to contrast timing. Patent carotid termini, MCA and ACA origins. ACA branches are stable and within normal limits. Normal anterior communicating artery. Right MCA M1 segment and bifurcation are patent without stenosis. Right MCA branches are stable. Left MCA M1 segment and bifurcation remain patent. Proximal left MCA branches appear stable since December when allowing for suboptimal contrast timing today. No acute M2 branch occlusion is identified. Venous sinuses: Early contrast timing. Anatomic variants: Mildly dominant right vertebral artery. Review of the MIP images confirms the above findings IMPRESSION: 1. Suboptimal CTA and CTP exams  related to contrast timing. No emergent large vessel occlusion identified. No infarct core detected. Erroneously ischemic penumbra on CTP seems to directly correspond to the December left hemisphere infarct. 2. Stable CTA Head and Neck since December when allowing for decreased arterial detail today. 3. Small bilateral layering pleural effusions also similar to the December CTA. Electronically Signed   By: Odessa Fleming M.D.   On: 09/25/2020 07:59   CT Code Stroke CTA Neck W/WO contrast  Result Date: 09/25/2020 CLINICAL DATA:  Code stroke. 45 year old male status post posterior left MCA/PCA watershed area infarct in December. Acute onset right arm weakness and drift this morning. EXAM: CT ANGIOGRAPHY HEAD AND NECK CT PERFUSION BRAIN TECHNIQUE: Multidetector CT imaging of the head and neck was performed using the standard protocol during bolus  administration of intravenous contrast. Multiplanar CT image reconstructions and MIPs were obtained to evaluate the vascular anatomy. Carotid stenosis measurements (when applicable) are obtained utilizing NASCET criteria, using the distal internal carotid diameter as the denominator. Multiphase CT imaging of the brain was performed following IV bolus contrast injection. Subsequent parametric perfusion maps were calculated using RAPID software. CONTRAST:  OMNIPAQUE IOHEXOL 350 MG/ML SOLN COMPARISON:  Plain head CT 0701 hours today. Brain MRI 07/20/2020 and earlier. CTA head and neck 07/19/2020. FINDINGS: CT Brain Perfusion Findings: ASPECTS: 10 (left hemisphere encephalomalacia). CBF (<30%) Volume: None Perfusion (Tmax>6.0s) volume: 59mL, but corresponding to the area of encephalomalacia in the posterior left hemisphere, and therefore felt to be artifactual Mismatch Volume: None suspected to be genuine (as above). Other findings: Suboptimal exam, with incompletely included arterial and venous outflow phase (series 701, image 9). CTA NECK Skeleton: No acute osseous abnormality identified. Upper chest: Small bilateral layering pleural effusions similar to the December appearance. Negative visible lung parenchyma, superior mediastinum. Incidental venous contrast reflux at the left thoracic inlet. Other neck: Paravertebral venous contrast reflux greater on the left and nearly extending to the skull base. Negative neck soft tissue contours. Aortic arch: Suboptimal arterial timing today. Three vessel arch configuration with no arch atherosclerosis. Right carotid system: Suboptimal contrast timing, but otherwise stable and negative compared to December. Left carotid system: Suboptimal contrast timing, and obscured left CCA at the thoracic inlet due to dense venous contrast reflux today. But otherwise stable compared to December and negative. Vertebral arteries: Suboptimal contrast timing today. Proximal subclavian  arteries and vertebral artery origins seem to remain normal. Paravertebral venous contrast reflux also limits V2 segment detail today a especially on the left. Mildly dominant right vertebral artery as before. No stenosis or occlusion is evident in the neck. CTA HEAD Posterior circulation: Suboptimal contrast timing. Distal vertebral arteries and basilar appear patent and stable, mildly dominant right V4. SCA and PCA origins appear stable. Bilateral PCA branches appear stable since December with mild irregularity more apparent in the right P2 segment. Anterior circulation: Both ICA siphons remain patent, with decreased siphon detail today due to contrast timing. Patent carotid termini, MCA and ACA origins. ACA branches are stable and within normal limits. Normal anterior communicating artery. Right MCA M1 segment and bifurcation are patent without stenosis. Right MCA branches are stable. Left MCA M1 segment and bifurcation remain patent. Proximal left MCA branches appear stable since December when allowing for suboptimal contrast timing today. No acute M2 branch occlusion is identified. Venous sinuses: Early contrast timing. Anatomic variants: Mildly dominant right vertebral artery. Review of the MIP images confirms the above findings IMPRESSION: 1. Suboptimal CTA and CTP exams related to contrast timing. No  emergent large vessel occlusion identified. No infarct core detected. Erroneously ischemic penumbra on CTP seems to directly correspond to the December left hemisphere infarct. 2. Stable CTA Head and Neck since December when allowing for decreased arterial detail today. 3. Small bilateral layering pleural effusions also similar to the December CTA. Electronically Signed   By: Odessa FlemingH  Hall M.D.   On: 09/25/2020 07:59   MR BRAIN WO CONTRAST  Result Date: 09/25/2020 CLINICAL DATA:  Acute neuro deficit.  Right arm weakness and drift. EXAM: MRI HEAD WITHOUT CONTRAST TECHNIQUE: Multiplanar, multiecho pulse sequences of  the brain and surrounding structures were obtained without intravenous contrast. COMPARISON:  MRI head 07/20/2020 FINDINGS: Brain: 5 mm focus of restricted diffusion left cerebellum compatible with acute infarct. Small area of diffusion hyperintensity in the left parietal cortex possibly acute infarct. Chronic hemorrhagic infarct in the left occipital parietal lobe. This showed restricted diffusion on the prior study. Mild white matter changes bilaterally. Chronic microhemorrhage in the left basal ganglia. Correlate with hypertension history. Ventricle size normal. Vascular: Normal arterial flow voids Skull and upper cervical spine: No focal skeletal abnormality. Sinuses/Orbits: Paranasal sinuses clear.  Negative orbit Other: None IMPRESSION: Small acute infarct left cerebellum. Possible acute infarct left parietal cortex Chronic hemorrhagic infarct left occipital parietal lobe Chronic microhemorrhage left basal ganglia. Correlate with hypertension history Electronically Signed   By: Marlan Palauharles  Clark M.D.   On: 09/25/2020 11:35   CT Code Stroke Cerebral Perfusion with contrast  Result Date: 09/25/2020 CLINICAL DATA:  Code stroke. 45 year old male status post posterior left MCA/PCA watershed area infarct in December. Acute onset right arm weakness and drift this morning. EXAM: CT ANGIOGRAPHY HEAD AND NECK CT PERFUSION BRAIN TECHNIQUE: Multidetector CT imaging of the head and neck was performed using the standard protocol during bolus administration of intravenous contrast. Multiplanar CT image reconstructions and MIPs were obtained to evaluate the vascular anatomy. Carotid stenosis measurements (when applicable) are obtained utilizing NASCET criteria, using the distal internal carotid diameter as the denominator. Multiphase CT imaging of the brain was performed following IV bolus contrast injection. Subsequent parametric perfusion maps were calculated using RAPID software. CONTRAST:  100mL OMNIPAQUE IOHEXOL 350  MG/ML SOLN COMPARISON:  Plain head CT 0701 hours today. Brain MRI 07/20/2020 and earlier. CTA head and neck 07/19/2020. FINDINGS: CT Brain Perfusion Findings: ASPECTS: 10 (left hemisphere encephalomalacia). CBF (<30%) Volume: None Perfusion (Tmax>6.0s) volume: 24mL, but corresponding to the area of encephalomalacia in the posterior left hemisphere, and therefore felt to be artifactual Mismatch Volume: None suspected to be genuine (as above). Other findings: Suboptimal exam, with incompletely included arterial and venous outflow phase (series 701, image 9). CTA NECK Skeleton: No acute osseous abnormality identified. Upper chest: Small bilateral layering pleural effusions similar to the December appearance. Negative visible lung parenchyma, superior mediastinum. Incidental venous contrast reflux at the left thoracic inlet. Other neck: Paravertebral venous contrast reflux greater on the left and nearly extending to the skull base. Negative neck soft tissue contours. Aortic arch: Suboptimal arterial timing today. Three vessel arch configuration with no arch atherosclerosis. Right carotid system: Suboptimal contrast timing, but otherwise stable and negative compared to December. Left carotid system: Suboptimal contrast timing, and obscured left CCA at the thoracic inlet due to dense venous contrast reflux today. But otherwise stable compared to December and negative. Vertebral arteries: Suboptimal contrast timing today. Proximal subclavian arteries and vertebral artery origins seem to remain normal. Paravertebral venous contrast reflux also limits V2 segment detail today a especially on the left. Mildly  dominant right vertebral artery as before. No stenosis or occlusion is evident in the neck. CTA HEAD Posterior circulation: Suboptimal contrast timing. Distal vertebral arteries and basilar appear patent and stable, mildly dominant right V4. SCA and PCA origins appear stable. Bilateral PCA branches appear stable since  December with mild irregularity more apparent in the right P2 segment. Anterior circulation: Both ICA siphons remain patent, with decreased siphon detail today due to contrast timing. Patent carotid termini, MCA and ACA origins. ACA branches are stable and within normal limits. Normal anterior communicating artery. Right MCA M1 segment and bifurcation are patent without stenosis. Right MCA branches are stable. Left MCA M1 segment and bifurcation remain patent. Proximal left MCA branches appear stable since December when allowing for suboptimal contrast timing today. No acute M2 branch occlusion is identified. Venous sinuses: Early contrast timing. Anatomic variants: Mildly dominant right vertebral artery. Review of the MIP images confirms the above findings IMPRESSION: 1. Suboptimal CTA and CTP exams related to contrast timing. No emergent large vessel occlusion identified. No infarct core detected. Erroneously ischemic penumbra on CTP seems to directly correspond to the December left hemisphere infarct. 2. Stable CTA Head and Neck since December when allowing for decreased arterial detail today. 3. Small bilateral layering pleural effusions also similar to the December CTA. Electronically Signed   By: Odessa Fleming M.D.   On: 09/25/2020 07:59   CT HEAD CODE STROKE WO CONTRAST  Result Date: 09/25/2020 CLINICAL DATA:  Code stroke. 45 year old male status post posterior left MCA/PCA watershed area infarct in December. Acute onset right arm weakness and drift this morning. EXAM: CT HEAD WITHOUT CONTRAST TECHNIQUE: Contiguous axial images were obtained from the base of the skull through the vertex without intravenous contrast. COMPARISON:  Brain MRI 07/20/2020.  Head CT 07/19/2020. FINDINGS: Brain: Confluent hypodensity in the posterior left temporal and lateral occipital lobes corresponds to diffusion abnormality in December and is compatible with post ischemic encephalomalacia. Mild asymmetry of the lateral ventricles is  stable, with no midline shift or intracranial mass effect evident. Gray-white matter differentiation elsewhere is stable. No acute intracranial hemorrhage identified. No acute cortically based infarct identified. Vascular: No suspicious intracranial vascular hyperdensity. Skull: No acute osseous abnormality identified. Sinuses/Orbits: Visualized paranasal sinuses and mastoids are stable and well pneumatized. Other: Stable orbit and scalp soft tissues. ASPECTS [facility] Stroke Program Early CT Score) Total score (0-10 with 10 being normal): 10 (chronic left hemisphere encephalomalacia) IMPRESSION: 1. Expected CT appearance of the left hemisphere infarct since December with encephalomalacia. No acute intracranial abnormality identified. ASPECTS 10. 2. These results were communicated to Dr. Iver Nestle at 7:11 am on 09/25/2020 by text page via the [facility] messaging system. Electronically Signed   By: Odessa Fleming M.D.   On: 09/25/2020 07:12   ECHOCARDIOGRAM LIMITED  Result Date: 09/25/2020    ECHOCARDIOGRAM LIMITED REPORT   Patient Name:   JANOS SHAMPINE Date of Exam: 09/25/2020 Medical Rec #:  [id number]             Height:       73.0 in Accession #:    [id number]            Weight:       293.0 lb Date of Birth:  11/04/1975             BSA:          2.531 m Patient Age:    44 years  BP:           131/110 mmHg Patient Gender: M                     HR:           98 bpm. Exam Location:  Inpatient Procedure: Limited Echo, Color Doppler, Cardiac Doppler and Intracardiac            Opacification Agent STAT ECHO Indications:    Stroke  History:        Patient has prior history of Echocardiogram examinations, most                 recent 07/20/2020. Covid Positive; Risk Factors:Hypertension,                 Dyslipidemia and Diabetes.  Sonographer:    Delcie Roch Referring Phys: [id number] SRISHTI L BHAGAT IMPRESSIONS  1. 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 The apical thrombus was confirmed with Definity  contrast.     . Left ventricular ejection fraction, by estimation, is <20%. The left ventricle has severely decreased function. The left ventricle demonstrates regional wall motion abnormalities (see scoring diagram/findings for description). Left ventricular diastolic parameters are consistent with Grade III diastolic dysfunction (restrictive). There is severe akinesis of the left ventricular, mid-apical lateral wall, anteroseptal wall and apical segment.  2. The mitral valve is normal in structure. Trivial mitral valve regurgitation. No evidence of mitral stenosis.  3. The aortic valve is normal in structure. Aortic valve regurgitation is not visualized. No aortic stenosis is present.  4. There is mildly elevated pulmonary artery systolic pressure. 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 The apical thrombus was confirmed with Definity contrast. Left ventricular ejection fraction, by estimation, is <20%. The left ventricle has severely decreased function. The left ventricle demonstrates regional wall motion abnormalities. Severe akinesis of the left ventricular, mid-apical lateral wall, anteroseptal wall and apical segment. Definity contrast agent was given IV to delineate the left ventricular endocardial borders. Left ventricular diastolic parameters are consistent with Grade III diastolic dysfunction (restrictive). Right Ventricle: There is mildly elevated pulmonary artery systolic pressure. The tricuspid regurgitant velocity is 2.61 m/s, and with an assumed right atrial pressure of 15 mmHg, the estimated right ventricular systolic pressure is 42.2 mmHg. Mitral Valve: The mitral valve is normal in structure. Trivial mitral valve regurgitation. No evidence of mitral valve stenosis. Tricuspid Valve: The tricuspid valve is normal in structure. Tricuspid valve regurgitation is mild. Aortic Valve: The aortic valve is normal in structure. Aortic valve regurgitation is not  visualized. No aortic stenosis is present. Pulmonic Valve: The pulmonic valve was normal in structure. Pulmonic valve regurgitation is not visualized. LEFT VENTRICLE PLAX 2D LVIDd:         6.80 cm LVIDs:         5.70 cm LV PW:         1.10 cm LV IVS:        1.00 cm LVOT diam:     2.30 cm LV SV:         28 LV SV Index:   11 LVOT Area:     4.15 cm  IVC IVC diam: 2.80 cm LEFT ATRIUM         Index LA diam:    4.40 cm 1.74 cm/m  AORTIC VALVE LVOT Vmax:   47.00 cm/s LVOT Vmean:  31.200 cm/s LVOT VTI:  0.067 m TRICUSPID VALVE TR Peak grad:   27.2 mmHg TR Vmax:        261.00 cm/s  SHUNTS Systemic VTI:  0.07 m Systemic Diam: 2.30 cm Kristeen Miss MD Electronically signed by Kristeen Miss MD Signature Date/Time: 09/25/2020/1:23:51 PM    Final     Cardiac Studies   2D echo 09/25/20 1. 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 The apical thrombus  was confirmed with Definity contrast.   . Left ventricular ejection fraction, by estimation, is <20%. The left  ventricle has severely decreased function. The left ventricle demonstrates  regional wall motion abnormalities (see scoring diagram/findings for  description). Left ventricular  diastolic parameters are consistent with Grade III diastolic dysfunction  (restrictive). There is severe akinesis of the left ventricular,  mid-apical lateral wall, anteroseptal wall and apical segment.  2. The mitral valve is normal in structure. Trivial mitral valve  regurgitation. No evidence of mitral stenosis.  3. The aortic valve is normal in structure. Aortic valve regurgitation is  not visualized. No aortic stenosis is present.  4. There is mildly elevated pulmonary artery systolic pressure.   Patient Profile     45 y.o. male with DM, HTN, NICM (dx 2018 in context of uncontrolled HTN, preceded by URI symptoms, normal coronaries by cath) who was lost to cardiology follow-up until seen again in 06/2020 when he was admitted with Covid-19 (unvaccinated),  stroke, LV thrombus and had further decline in LVEF to 20-25% in setting of noncompliance with medications. He was treated medically. Some outpatient compliance issues noted (leaving AMA from primary care office with severely elevated BP, cost concerns with OVs). He was readmitted 09/25/2020 with recurrent stroke in the context of subtherapeutic INR. Cardiology consulted for LV thrombus and continued severe LV dysfunction.  Assessment & Plan    1. Recurrent stroke - neuro following  2. 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 Tresa Endo told me this morning he had spoken with pharmD about consideration for potential use of Eliquis in this scenario where compliance is a concern - difficult given off-label use but one study did suggest may be viable option. On the other hand, INR allows for more transparent surveillance of adequate anticoagulation/compliance  3. Severe nonischemic cardiomyopathy - continue medical therapy for now with BB, spironolactone, losartan with eye toward titration of regimen as BP allow (typically managed with permissive HTN post-stroke) - would benefit from f/u in the [facility] clinic after discharge for social work support for medication  4. HTN - follow in context of above  5. DM - poorly controlled with A1C 7.9  6. Covid positive on 3/8 - unclear if this is residual from prior infection or new infection - denies fever/chills, reports chronic cough since December infection - per IM  For questions or updates, please contact [facility] Please consult [email or web address] for contact info under Cardiology/STEMI.  Signed, Laurann Montana, PA-C 09/26/2020, 12:18 PM    Personally seen and examined. Agree with above.   CVA/LVthrombus/ subtx INR  - agree. Makes sense  to use DOAC in this situation to provide better therapeutic levels of anticoagulation.   Donato Schultz, MD.

## 2020-09-26 NOTE — Progress Notes (Addendum)
HD#1 Subjective:  Overnight Events: Admitted yesterday  Rodney Kane notes that he is hungry and very frustrated he has not yet received food since being in the hospital.  We discussed how with any new stroke, you are made n.p.o. until evaluated for your swallowing.  He endorses frustration in not being able to communicate as effectively as he would like and continues to endorse mild weakness and tingling of his right hand, the latter of which he states has been chronic since his previous stroke.  Denies CP, palpitations, new numbness or weakness or any other acute symptoms.   When asked about prior medication adherence, patient states that he was unable to afford some of the medications.  He also has not yet attended a speech therapy appointment, his first visit was supposed to be yesterday.  Objective:  Vital signs in last 24 hours: Vitals:   09/25/20 1822 09/25/20 2022 09/26/20 0010 09/26/20 0427  BP: (!) 141/106 (!) 142/102 (!) 138/100 (!) 136/104  Pulse: 100 100 96 97  Resp: 18 18 19 20   Temp: 97.8 F (36.6 C) 97.9 F (36.6 C) 98.4 F (36.9 C) 98.7 F (37.1 C)  TempSrc: Oral Oral Oral Oral  SpO2: 99% 100% 98% 100%  Weight:      Height:       Supplemental O2: Room Air SpO2: 100 %   Physical Exam:  Constitutional: Well appearing, resting in bed comfortably HENT: normocephalic atraumatic, mucous membranes moist Eyes: conjunctiva non-erythematous Neck: supple Cardiovascular: regular rate and rhythm, no m/r/g Pulmonary/Chest: normal work of breathing on room air, lungs clear to auscultation bilaterally Abdominal: soft, non-tender, non-distended MSK: normal bulk and tone Neurological: alert & oriented x 3, no facial asymmetry, EOMI, PERRL, no uvular deviation, no tongue fasciculations, upper extremity strength right 4/5, left 5/5.  Normal finger-to-nose bilaterally.  No pronator drift.  Lower extremity strength 5/5 bilaterally.  Normal heel-to-shin bilaterally.  Normal  sensation throughout.  Patient with expressive aphasia. Skin: warm and dry Psych: Normal thought process, frustrated at times  [facility]   09/25/20 0756  Weight: 132.9 kg     Intake/Output Summary (Last 24 hours) at 09/26/2020 0617 Last data filed at 09/25/2020 2218 Gross per 24 hour  Intake --  Output 200 ml  Net -200 ml   Net IO Since Admission: -200 mL [09/26/20 0617]  Pertinent Labs: CBC Latest Ref Rng & Units 09/26/2020 09/25/2020 09/25/2020  WBC 4.0 - 10.5 K/uL 6.1 - 6.3  Hemoglobin 13.0 - 17.0 g/dL 11/25/2020 69.6 78.9  Hematocrit 39.0 - 52.0 % 46.7 50.0 50.2  Platelets 150 - 400 K/uL 237 - 264    CMP Latest Ref Rng & Units 09/26/2020 09/25/2020 09/25/2020  Glucose 70 - 99 mg/dL 11/25/2020) 017(P) 102(H)  BUN 6 - 20 mg/dL 15 852(D) 17  Creatinine 0.61 - 1.24 mg/dL 78(E 4.23) 5.36(R)  Sodium 135 - 145 mmol/L 137 140 136  Potassium 3.5 - 5.1 mmol/L 4.4 4.2 4.2  Chloride 98 - 111 mmol/L 102 100 101  CO2 22 - 32 mmol/L 22 - 27  Calcium 8.9 - 10.3 mg/dL 9.3 - 9.4  Total Protein 6.5 - 8.1 g/dL - - 6.9  Total Bilirubin 0.3 - 1.2 mg/dL - - 1.1  Alkaline Phos 38 - 126 U/L - - 119  AST 15 - 41 U/L - - 99(H)  ALT 0 - 44 U/L - - 53(H)    Imaging: CT Code Stroke CTA Head W/WO contrast  Result Date: 09/25/2020 CLINICAL DATA:  Code stroke. 45 year old male status post posterior left MCA/PCA watershed area infarct in December. Acute onset right arm weakness and drift this morning. EXAM: CT ANGIOGRAPHY HEAD AND NECK CT PERFUSION BRAIN TECHNIQUE: Multidetector CT imaging of the head and neck was performed using the standard protocol during bolus administration of intravenous contrast. Multiplanar CT image reconstructions and MIPs were obtained to evaluate the vascular anatomy. Carotid stenosis measurements (when applicable) are obtained utilizing NASCET criteria, using the distal internal carotid diameter as the denominator. Multiphase CT imaging of the brain was performed following IV bolus contrast  injection. Subsequent parametric perfusion maps were calculated using RAPID software. CONTRAST:  OMNIPAQUE IOHEXOL 350 MG/ML SOLN COMPARISON:  Plain head CT 0701 hours today. Brain MRI 07/20/2020 and earlier. CTA head and neck 07/19/2020. FINDINGS: CT Brain Perfusion Findings: ASPECTS: 10 (left hemisphere encephalomalacia). CBF (<30%) Volume: None Perfusion (Tmax>6.0s) volume: 25mL, but corresponding to the area of encephalomalacia in the posterior left hemisphere, and therefore felt to be artifactual Mismatch Volume: None suspected to be genuine (as above). Other findings: Suboptimal exam, with incompletely included arterial and venous outflow phase (series 701, image 9). CTA NECK Skeleton: No acute osseous abnormality identified. Upper chest: Small bilateral layering pleural effusions similar to the December appearance. Negative visible lung parenchyma, superior mediastinum. Incidental venous contrast reflux at the left thoracic inlet. Other neck: Paravertebral venous contrast reflux greater on the left and nearly extending to the skull base. Negative neck soft tissue contours. Aortic arch: Suboptimal arterial timing today. Three vessel arch configuration with no arch atherosclerosis. Right carotid system: Suboptimal contrast timing, but otherwise stable and negative compared to December. Left carotid system: Suboptimal contrast timing, and obscured left CCA at the thoracic inlet due to dense venous contrast reflux today. But otherwise stable compared to December and negative. Vertebral arteries: Suboptimal contrast timing today. Proximal subclavian arteries and vertebral artery origins seem to remain normal. Paravertebral venous contrast reflux also limits V2 segment detail today a especially on the left. Mildly dominant right vertebral artery as before. No stenosis or occlusion is evident in the neck. CTA HEAD Posterior circulation: Suboptimal contrast timing. Distal vertebral arteries and basilar appear  patent and stable, mildly dominant right V4. SCA and PCA origins appear stable. Bilateral PCA branches appear stable since December with mild irregularity more apparent in the right P2 segment. Anterior circulation: Both ICA siphons remain patent, with decreased siphon detail today due to contrast timing. Patent carotid termini, MCA and ACA origins. ACA branches are stable and within normal limits. Normal anterior communicating artery. Right MCA M1 segment and bifurcation are patent without stenosis. Right MCA branches are stable. Left MCA M1 segment and bifurcation remain patent. Proximal left MCA branches appear stable since December when allowing for suboptimal contrast timing today. No acute M2 branch occlusion is identified. Venous sinuses: Early contrast timing. Anatomic variants: Mildly dominant right vertebral artery. Review of the MIP images confirms the above findings IMPRESSION: 1. Suboptimal CTA and CTP exams related to contrast timing. No emergent large vessel occlusion identified. No infarct core detected. Erroneously ischemic penumbra on CTP seems to directly correspond to the December left hemisphere infarct. 2. Stable CTA Head and Neck since December when allowing for decreased arterial detail today. 3. Small bilateral layering pleural effusions also similar to the December CTA. Electronically Signed   By: Odessa Fleming M.D.   On: 09/25/2020 07:59   CT Code Stroke CTA Neck W/WO contrast  Result Date: 09/25/2020 CLINICAL DATA:  Code stroke. 45 year old male status  post posterior left MCA/PCA watershed area infarct in December. Acute onset right arm weakness and drift this morning. EXAM: CT ANGIOGRAPHY HEAD AND NECK CT PERFUSION BRAIN TECHNIQUE: Multidetector CT imaging of the head and neck was performed using the standard protocol during bolus administration of intravenous contrast. Multiplanar CT image reconstructions and MIPs were obtained to evaluate the vascular anatomy. Carotid stenosis  measurements (when applicable) are obtained utilizing NASCET criteria, using the distal internal carotid diameter as the denominator. Multiphase CT imaging of the brain was performed following IV bolus contrast injection. Subsequent parametric perfusion maps were calculated using RAPID software. CONTRAST:  OMNIPAQUE IOHEXOL 350 MG/ML SOLN COMPARISON:  Plain head CT 0701 hours today. Brain MRI 07/20/2020 and earlier. CTA head and neck 07/19/2020. FINDINGS: CT Brain Perfusion Findings: ASPECTS: 10 (left hemisphere encephalomalacia). CBF (<30%) Volume: None Perfusion (Tmax>6.0s) volume: 77mL, but corresponding to the area of encephalomalacia in the posterior left hemisphere, and therefore felt to be artifactual Mismatch Volume: None suspected to be genuine (as above). Other findings: Suboptimal exam, with incompletely included arterial and venous outflow phase (series 701, image 9). CTA NECK Skeleton: No acute osseous abnormality identified. Upper chest: Small bilateral layering pleural effusions similar to the December appearance. Negative visible lung parenchyma, superior mediastinum. Incidental venous contrast reflux at the left thoracic inlet. Other neck: Paravertebral venous contrast reflux greater on the left and nearly extending to the skull base. Negative neck soft tissue contours. Aortic arch: Suboptimal arterial timing today. Three vessel arch configuration with no arch atherosclerosis. Right carotid system: Suboptimal contrast timing, but otherwise stable and negative compared to December. Left carotid system: Suboptimal contrast timing, and obscured left CCA at the thoracic inlet due to dense venous contrast reflux today. But otherwise stable compared to December and negative. Vertebral arteries: Suboptimal contrast timing today. Proximal subclavian arteries and vertebral artery origins seem to remain normal. Paravertebral venous contrast reflux also limits V2 segment detail today a especially on the  left. Mildly dominant right vertebral artery as before. No stenosis or occlusion is evident in the neck. CTA HEAD Posterior circulation: Suboptimal contrast timing. Distal vertebral arteries and basilar appear patent and stable, mildly dominant right V4. SCA and PCA origins appear stable. Bilateral PCA branches appear stable since December with mild irregularity more apparent in the right P2 segment. Anterior circulation: Both ICA siphons remain patent, with decreased siphon detail today due to contrast timing. Patent carotid termini, MCA and ACA origins. ACA branches are stable and within normal limits. Normal anterior communicating artery. Right MCA M1 segment and bifurcation are patent without stenosis. Right MCA branches are stable. Left MCA M1 segment and bifurcation remain patent. Proximal left MCA branches appear stable since December when allowing for suboptimal contrast timing today. No acute M2 branch occlusion is identified. Venous sinuses: Early contrast timing. Anatomic variants: Mildly dominant right vertebral artery. Review of the MIP images confirms the above findings IMPRESSION: 1. Suboptimal CTA and CTP exams related to contrast timing. No emergent large vessel occlusion identified. No infarct core detected. Erroneously ischemic penumbra on CTP seems to directly correspond to the December left hemisphere infarct. 2. Stable CTA Head and Neck since December when allowing for decreased arterial detail today. 3. Small bilateral layering pleural effusions also similar to the December CTA. Electronically Signed   By: Odessa Fleming M.D.   On: 09/25/2020 07:59   MR BRAIN WO CONTRAST  Result Date: 09/25/2020 CLINICAL DATA:  Acute neuro deficit.  Right arm weakness and drift. EXAM: MRI HEAD WITHOUT  CONTRAST TECHNIQUE: Multiplanar, multiecho pulse sequences of the brain and surrounding structures were obtained without intravenous contrast. COMPARISON:  MRI head 07/20/2020 FINDINGS: Brain: 5 mm focus of  restricted diffusion left cerebellum compatible with acute infarct. Small area of diffusion hyperintensity in the left parietal cortex possibly acute infarct. Chronic hemorrhagic infarct in the left occipital parietal lobe. This showed restricted diffusion on the prior study. Mild white matter changes bilaterally. Chronic microhemorrhage in the left basal ganglia. Correlate with hypertension history. Ventricle size normal. Vascular: Normal arterial flow voids Skull and upper cervical spine: No focal skeletal abnormality. Sinuses/Orbits: Paranasal sinuses clear.  Negative orbit Other: None IMPRESSION: Small acute infarct left cerebellum. Possible acute infarct left parietal cortex Chronic hemorrhagic infarct left occipital parietal lobe Chronic microhemorrhage left basal ganglia. Correlate with hypertension history Electronically Signed   By: Marlan Palau M.D.   On: 09/25/2020 11:35   CT Code Stroke Cerebral Perfusion with contrast  Result Date: 09/25/2020 CLINICAL DATA:  Code stroke. 45 year old male status post posterior left MCA/PCA watershed area infarct in December. Acute onset right arm weakness and drift this morning. EXAM: CT ANGIOGRAPHY HEAD AND NECK CT PERFUSION BRAIN TECHNIQUE: Multidetector CT imaging of the head and neck was performed using the standard protocol during bolus administration of intravenous contrast. Multiplanar CT image reconstructions and MIPs were obtained to evaluate the vascular anatomy. Carotid stenosis measurements (when applicable) are obtained utilizing NASCET criteria, using the distal internal carotid diameter as the denominator. Multiphase CT imaging of the brain was performed following IV bolus contrast injection. Subsequent parametric perfusion maps were calculated using RAPID software. CONTRAST:  OMNIPAQUE IOHEXOL 350 MG/ML SOLN COMPARISON:  Plain head CT 0701 hours today. Brain MRI 07/20/2020 and earlier. CTA head and neck 07/19/2020. FINDINGS: CT Brain Perfusion  Findings: ASPECTS: 10 (left hemisphere encephalomalacia). CBF (<30%) Volume: None Perfusion (Tmax>6.0s) volume: 24mL, but corresponding to the area of encephalomalacia in the posterior left hemisphere, and therefore felt to be artifactual Mismatch Volume: None suspected to be genuine (as above). Other findings: Suboptimal exam, with incompletely included arterial and venous outflow phase (series 701, image 9). CTA NECK Skeleton: No acute osseous abnormality identified. Upper chest: Small bilateral layering pleural effusions similar to the December appearance. Negative visible lung parenchyma, superior mediastinum. Incidental venous contrast reflux at the left thoracic inlet. Other neck: Paravertebral venous contrast reflux greater on the left and nearly extending to the skull base. Negative neck soft tissue contours. Aortic arch: Suboptimal arterial timing today. Three vessel arch configuration with no arch atherosclerosis. Right carotid system: Suboptimal contrast timing, but otherwise stable and negative compared to December. Left carotid system: Suboptimal contrast timing, and obscured left CCA at the thoracic inlet due to dense venous contrast reflux today. But otherwise stable compared to December and negative. Vertebral arteries: Suboptimal contrast timing today. Proximal subclavian arteries and vertebral artery origins seem to remain normal. Paravertebral venous contrast reflux also limits V2 segment detail today a especially on the left. Mildly dominant right vertebral artery as before. No stenosis or occlusion is evident in the neck. CTA HEAD Posterior circulation: Suboptimal contrast timing. Distal vertebral arteries and basilar appear patent and stable, mildly dominant right V4. SCA and PCA origins appear stable. Bilateral PCA branches appear stable since December with mild irregularity more apparent in the right P2 segment. Anterior circulation: Both ICA siphons remain patent, with decreased siphon  detail today due to contrast timing. Patent carotid termini, MCA and ACA origins. ACA branches are stable and within normal limits. Normal  anterior communicating artery. Right MCA M1 segment and bifurcation are patent without stenosis. Right MCA branches are stable. Left MCA M1 segment and bifurcation remain patent. Proximal left MCA branches appear stable since December when allowing for suboptimal contrast timing today. No acute M2 branch occlusion is identified. Venous sinuses: Early contrast timing. Anatomic variants: Mildly dominant right vertebral artery. Review of the MIP images confirms the above findings IMPRESSION: 1. Suboptimal CTA and CTP exams related to contrast timing. No emergent large vessel occlusion identified. No infarct core detected. Erroneously ischemic penumbra on CTP seems to directly correspond to the December left hemisphere infarct. 2. Stable CTA Head and Neck since December when allowing for decreased arterial detail today. 3. Small bilateral layering pleural effusions also similar to the December CTA. Electronically Signed   By: Odessa Fleming M.D.   On: 09/25/2020 07:59   CT HEAD CODE STROKE WO CONTRAST  Result Date: 09/25/2020 CLINICAL DATA:  Code stroke. 45 year old male status post posterior left MCA/PCA watershed area infarct in December. Acute onset right arm weakness and drift this morning. EXAM: CT HEAD WITHOUT CONTRAST TECHNIQUE: Contiguous axial images were obtained from the base of the skull through the vertex without intravenous contrast. COMPARISON:  Brain MRI 07/20/2020.  Head CT 07/19/2020. FINDINGS: Brain: Confluent hypodensity in the posterior left temporal and lateral occipital lobes corresponds to diffusion abnormality in December and is compatible with post ischemic encephalomalacia. Mild asymmetry of the lateral ventricles is stable, with no midline shift or intracranial mass effect evident. Gray-white matter differentiation elsewhere is stable. No acute intracranial  hemorrhage identified. No acute cortically based infarct identified. Vascular: No suspicious intracranial vascular hyperdensity. Skull: No acute osseous abnormality identified. Sinuses/Orbits: Visualized paranasal sinuses and mastoids are stable and well pneumatized. Other: Stable orbit and scalp soft tissues. ASPECTS [facility] Stroke Program Early CT Score) Total score (0-10 with 10 being normal): 10 (chronic left hemisphere encephalomalacia) IMPRESSION: 1. Expected CT appearance of the left hemisphere infarct since December with encephalomalacia. No acute intracranial abnormality identified. ASPECTS 10. 2. These results were communicated to Dr. Iver Nestle at 7:11 am on 09/25/2020 by text page via the [facility] messaging system. Electronically Signed   By: Odessa Fleming M.D.   On: 09/25/2020 07:12   ECHOCARDIOGRAM LIMITED  Result Date: 09/25/2020    ECHOCARDIOGRAM LIMITED REPORT   Patient Name:   Rodney Kane Date of Exam: 09/25/2020 Medical Rec #:  [id number]             Height:       73.0 in Accession #:    [id number]            Weight:       293.0 lb Date of Birth:  [birth year]             BSA:          2.531 m Patient Age:    44 years              BP:           131/110 mmHg Patient Gender: M                     HR:           98 bpm. Exam Location:  Inpatient Procedure: Limited Echo, Color Doppler, Cardiac Doppler and Intracardiac            Opacification Agent STAT ECHO Indications:    Stroke  History:  Patient has prior history of Echocardiogram examinations, most                 recent 07/20/2020. Covid Positive; Risk Factors:Hypertension,                 Dyslipidemia and Diabetes.  Sonographer:    Delcie RochLauren Pennington Referring Phys: [id number] SRISHTI L BHAGAT IMPRESSIONS  1. 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 The apical thrombus was confirmed with Definity contrast.     . Left ventricular ejection fraction, by estimation, is <20%. The left ventricle has severely decreased function. The left  ventricle demonstrates regional wall motion abnormalities (see scoring diagram/findings for description). Left ventricular diastolic parameters are consistent with Grade III diastolic dysfunction (restrictive). There is severe akinesis of the left ventricular, mid-apical lateral wall, anteroseptal wall and apical segment.  2. The mitral valve is normal in structure. Trivial mitral valve regurgitation. No evidence of mitral stenosis.  3. The aortic valve is normal in structure. Aortic valve regurgitation is not visualized. No aortic stenosis is present.  4. There is mildly elevated pulmonary artery systolic pressure. 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 The apical thrombus was confirmed with Definity contrast. Left ventricular ejection fraction, by estimation, is <20%. The left ventricle has severely decreased function. The left ventricle demonstrates regional wall motion abnormalities. Severe akinesis of the left ventricular, mid-apical lateral wall, anteroseptal wall and apical segment. Definity contrast agent was given IV to delineate the left ventricular endocardial borders. Left ventricular diastolic parameters are consistent with Grade III diastolic dysfunction (restrictive). Right Ventricle: There is mildly elevated pulmonary artery systolic pressure. The tricuspid regurgitant velocity is 2.61 m/s, and with an assumed right atrial pressure of 15 mmHg, the estimated right ventricular systolic pressure is 42.2 mmHg. Mitral Valve: The mitral valve is normal in structure. Trivial mitral valve regurgitation. No evidence of mitral valve stenosis. Tricuspid Valve: The tricuspid valve is normal in structure. Tricuspid valve regurgitation is mild. Aortic Valve: The aortic valve is normal in structure. Aortic valve regurgitation is not visualized. No aortic stenosis is present. Pulmonic Valve: The pulmonic valve was normal in structure. Pulmonic valve regurgitation is not  visualized. LEFT VENTRICLE PLAX 2D LVIDd:         6.80 cm LVIDs:         5.70 cm LV PW:         1.10 cm LV IVS:        1.00 cm LVOT diam:     2.30 cm LV SV:         28 LV SV Index:   11 LVOT Area:     4.15 cm  IVC IVC diam: 2.80 cm LEFT ATRIUM         Index LA diam:    4.40 cm 1.74 cm/m  AORTIC VALVE LVOT Vmax:   47.00 cm/s LVOT Vmean:  31.200 cm/s LVOT VTI:    0.067 m TRICUSPID VALVE TR Peak grad:   27.2 mmHg TR Vmax:        261.00 cm/s  SHUNTS Systemic VTI:  0.07 m Systemic Diam: 2.30 cm Kristeen MissPhilip Nahser MD Electronically signed by Kristeen MissPhilip Nahser MD Signature Date/Time: 09/25/2020/1:23:51 PM    Final     Assessment/Plan:   Active Problems:   Nonischemic cardiomyopathy (HCC)   LV (left ventricular) mural thrombus   CVA (cerebral vascular accident) (HCC)   Hyperlipidemia   Patient Summary: Rodney Kane is  a 45 y.o. 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   Acute infarct of left cerebellum, and left parietal cortex Subtherapeutic anticoagulation MRI brain without contrast revealed several punctate strokes.  Per neurology, will continue with anticoagulation.  Will not start antiplatelets.  Cardiology to reach out to pharmacy to discuss DOAC treatment, currently on heparin drip.  Highly concern with starting DOAC treatment is patient's affordability of the medication.  He endorsed difficulty with adherence to warfarin because of the cost.  Patient to work with Pharm.D. to investigate affordability of Eliquis. -Neurology/cardiology following, appreciate recommendations -Continue on heparin drip -A1c of 7.9.  On glipizide twice daily and Januvia. -Diet  ordered yesterday evening, passed bedside swallow eval.  SLP to evaluate as well. -Per neurology, BP goal of normotension.  LV thrombus: Currently on heparin drip.  See plan as per above -Cardiology consulted, appreciate recommendations  Severe nonischemic cardiomyopathy EF less than 20% Plan Per cardiology, patient may also benefit from SGLT2/GLP-1 -Continue losartan with plan to transition to [facility] -Continue carvedilol and add spironolactone -BNP pending  Hypertension: Per neurology goal is normotension. -Continue carvedilol, losartan, spironolactone  Diabetes mellitus type 2. A1c of 7.9 Home medication of glipizide and Januvia.  Patient may benefit from transitioning to SGLT2/GLP-1.   -SSI while admitted  History of COVID-19 infection Patient tested positive for Covid on 12/30.  Patient is outside of precautions window, he will continue to test positive via PCR for up to 3 months after his diagnosis.  He continues to fall within this 48-month timeframe, we will discontinue precautions.  Diet: Heart Healthy IVF: None,None VTE: Heparin Code: Full PT/OT recs: None TOC recs: None  Dispo: Anticipated discharge to Home in 3-4 days days pending further treatment.   Thalia Bloodgood DO Internal Medicine Resident PGY-1 Pager [phone] Please contact the on call pager after 5 pm and on weekends at [phone].

## 2020-09-27 ENCOUNTER — Other Ambulatory Visit: Payer: Self-pay | Admitting: Student

## 2020-09-27 ENCOUNTER — Other Ambulatory Visit: Payer: Self-pay | Admitting: *Deleted

## 2020-09-27 ENCOUNTER — Encounter: Payer: Self-pay | Admitting: Cardiology

## 2020-09-27 LAB — CBC
HCT: 44.1 % (ref 39.0–52.0)
Hemoglobin: 13.8 g/dL (ref 13.0–17.0)
MCH: 25.4 pg — ABNORMAL LOW (ref 26.0–34.0)
MCHC: 31.3 g/dL (ref 30.0–36.0)
MCV: 81.1 fL (ref 80.0–100.0)
Platelets: 221 10*3/uL (ref 150–400)
RBC: 5.44 MIL/uL (ref 4.22–5.81)
RDW: 15.6 % — ABNORMAL HIGH (ref 11.5–15.5)
WBC: 5.8 10*3/uL (ref 4.0–10.5)
nRBC: 0 % (ref 0.0–0.2)

## 2020-09-27 LAB — GLUCOSE, CAPILLARY
Glucose-Capillary: 128 mg/dL — ABNORMAL HIGH (ref 70–99)
Glucose-Capillary: 146 mg/dL — ABNORMAL HIGH (ref 70–99)
Glucose-Capillary: 129 mg/dL — ABNORMAL HIGH (ref 70–99)

## 2020-09-27 LAB — HEPATIC FUNCTION PANEL
ALT: 41 U/L (ref 0–44)
AST: 63 U/L — ABNORMAL HIGH (ref 15–41)
Albumin: 3 g/dL — ABNORMAL LOW (ref 3.5–5.0)
Alkaline Phosphatase: 89 U/L (ref 38–126)
Bilirubin, Direct: 0.4 mg/dL — ABNORMAL HIGH (ref 0.0–0.2)
Indirect Bilirubin: 1.1 mg/dL — ABNORMAL HIGH (ref 0.3–0.9)
Total Bilirubin: 1.5 mg/dL — ABNORMAL HIGH (ref 0.3–1.2)
Total Protein: 6 g/dL — ABNORMAL LOW (ref 6.5–8.1)

## 2020-09-27 LAB — BASIC METABOLIC PANEL
Anion gap: 10 (ref 5–15)
BUN: 18 mg/dL (ref 6–20)
CO2: 24 mmol/L (ref 22–32)
Calcium: 8.9 mg/dL (ref 8.9–10.3)
Chloride: 102 mmol/L (ref 98–111)
Creatinine, Ser: 1.29 mg/dL — ABNORMAL HIGH (ref 0.61–1.24)
GFR, Estimated: >60 mL/min (ref >60)
Glucose, Bld: 126 mg/dL — ABNORMAL HIGH (ref 70–99)
Potassium: 4.1 mmol/L (ref 3.5–5.1)
Sodium: 136 mmol/L (ref 135–145)

## 2020-09-27 MED ORDER — SPIRONOLACTONE 25 MG PO TABS: 12.5000 mg | ORAL_TABLET | Freq: Every day | ORAL | 3 refills | Status: DC

## 2020-09-27 MED ORDER — POLYETHYLENE GLYCOL 3350 17 G PO PACK
17.0000 g | PACK | Freq: Every day | ORAL | Status: DC
  Administered 2020-09-27: 17 g via ORAL
  Filled 2020-09-27: qty 1

## 2020-09-27 MED ORDER — GUAIFENESIN ER 600 MG PO TB12: 600.0000 mg | ORAL_TABLET | Freq: Two times a day (BID) | ORAL | Status: DC | PRN

## 2020-09-27 MED FILL — SPIRONOLACTONE 25 MG TABLET: 25 | 30 days supply | Qty: 15 | Fill #0

## 2020-09-27 NOTE — Assessment & Plan Note (Signed)
This is a listed diagnosis, rate seems controlled.  He is on beta-blocker. Also on warfarin.   This patients CHA2DS2-VASc Score and unadjusted Ischemic Stroke Rate (% per year) is equal to 3.2 % stroke rate/year from a score of 3  Above score calculated as 1 point each if present [CHF, HTN, DM, Vascular=MI/PAD/Aortic Plaque, Age if 65-74, or Male] Above score calculated as 2 points each if present [Age > 75, or Stroke/TIA/TE]

## 2020-09-27 NOTE — Progress Notes (Signed)
   Xarelto. Spironolactone, carvedilol, losartan.  Please see note from yesterday.  No new recommendations at this time.  Please let us know if we can be of further assistance.  Donato Schultz, MD

## 2020-09-27 NOTE — Discharge Summary (Signed)
Name: Rodney Kane MRN: [id number] DOB: [birth year] 45 y.o. PCP: Azzie Glatter, FNP  Date of Admission: 09/25/2020  6:46 AM Date of Discharge: 09/27/2020 Attending Physician: Dr. Daryll Drown  Discharge Diagnosis: Active Problems:   Nonischemic cardiomyopathy ([place])   LV (left ventricular) mural thrombus   CVA (cerebral vascular accident) [name])   Hyperlipidemia    Discharge Medications: Allergies as of 09/27/2020   No Known Allergies     Medication List    STOP taking these medications   blood glucose meter kit and supplies   insulin aspart 100 UNIT/ML injection Commonly known as: NovoLOG   insulin detemir 100 UNIT/ML injection Commonly known as: Levemir   warfarin 5 MG tablet Commonly known as: COUMADIN     TAKE these medications   albuterol 108 (90 Base) MCG/ACT inhaler Commonly known as: VENTOLIN HFA Inhale 2 puffs into the lungs every 6 (six) hours as needed for wheezing or shortness of breath.   atorvastatin 10 MG tablet Commonly known as: LIPITOR Take 1 tablet (10 mg total) by mouth daily.   carvedilol 12.5 MG tablet Commonly known as: COREG Take 1 tablet (12.5 mg total) by mouth 2 (two) times daily with a meal.   cetirizine 10 MG tablet Commonly known as: ZyrTEC Allergy Take 1 tablet (10 mg total) by mouth daily.   furosemide 40 MG tablet Commonly known as: LASIX Take 80 mg  ( 2 tablets of 40 mg) every other day with 40 mg (1 tablet) the opposite day   glipiZIDE 10 MG tablet Commonly known as: GLUCOTROL Take 1 tablet (10 mg total) by mouth 2 (two) times daily before a meal.   hydrocortisone cream 1 % Apply 1 application topically 2 (two) times daily.   hydrOXYzine 10 MG tablet Commonly known as: ATARAX/VISTARIL Take 1 tablet (10 mg total) by mouth 3 (three) times daily as needed.   losartan 50 MG tablet Commonly known as: COZAAR Take 1 tablet (50 mg total) by mouth 2 (two) times daily.   polyethylene glycol powder 17 GM/SCOOP  powder Commonly known as: GLYCOLAX/MIRALAX Take 17 g by mouth daily.   potassium chloride SA 20 MEQ tablet Commonly known as: KLOR-CON Take 1 tablet (20 mEq total) by mouth daily.   rivaroxaban 20 MG Tabs tablet Commonly known as: XARELTO Take 1 tablet (20 mg total) by mouth daily with supper.   sitaGLIPtin 25 MG tablet Commonly known as: Januvia Take 1 tablet (25 mg total) by mouth daily.   spironolactone 25 MG tablet Commonly known as: ALDACTONE Take 0.5 tablets (12.5 mg total) by mouth daily. What changed: how much to take   Vitamin D (Ergocalciferol) 1.25 MG (50000 UNIT) Caps capsule Commonly known as: DRISDOL Take 1 capsule (50,000 Units total) by mouth every 7 (seven) days. What changed: additional instructions   zinc sulfate 220 (50 Zn) MG capsule Take 1 capsule (220 mg total) by mouth daily.       Disposition and follow-up:   Rodney Kane was discharged from [facility] in Stable condition.  At the hospital follow up visit please address:  1.  Follow-up:  A.  Acute CVA-patient to follow-up with neurology and PCP    B.  LV thrombus-patient to follow-up with cardiology as well as PCP.   C.  Diabetes-follow-up with primary care provider for medication   D.  Severe nonischemic cardiomyopathy-follow-up with cardiology consider        SGLT2/GLP-1   E.  Electrolyte abnormalities-repeat BMP  2.  Labs / imaging needed at time of follow-up: BMP, liver enzymes, discharge  3.  Pending labs/ test needing follow-up: None  4.  Medication Changes  Started: Xarelto 15 mgs BID, spironolactone 12.5 mg daily  Stopped: Warfarin  Changed: None  Abx -none   Follow-up Appointments:  Follow-up Information    Azzie Glatter, FNP. Schedule an appointment as soon as possible for a visit.   Specialty: Family Medicine Why: Please send any FMLA/Short Term Disability paperwork to office fax at [phone] (no emails accepted). Paperwork should be  completed in 7-14 days, call if any additional questions! Contact information: [place] [address] [phone]        Leonie Man, MD. Schedule an appointment as soon as possible for a visit.   Specialty: Cardiology Contact information: [address] [place] [address] [place]. Schedule an appointment as soon as possible for a visit.   Contact information: [address]     [address] [place] [id number] [place] Hospital Course by problem list:   Acute infarct of left cerebellum, and left parietal cortex Subtherapeutic anticoagulation Patient presented with right arm weakness.  He did endorse nonadherence to his anticoagulation regimen secondary to cost.  His weakness improved on admission MRI revealed new strokes in multiple areas consistent with embolic pathology.  Recent INR showed subtherapeutic levels.  Cardiology and neurology were both consulted.  On exam patient was found to have 4/5 right upper extremity weakness.  Also noted to have right-sided facial droop that resolved during the exam.  Echocardiogram revealed large apical mural thrombus in the LV.  LDL of 50 and hemoglobin A1c of 7.9.  Patient will need to be adherent to medication regimen in order to prevent further emboli.  Discussions had with all specialists involved including pharmacist who recommended Xarelto.  Patient had discussions with transitions of care about affordability, and will be charged $10 a month for Xarelto.  Also need optimization of his diabetes as per below.  Plan to follow-up with cardiology as well as neurology and PCP on outpatient basis.  Emphasized patient importance of staying on his Xarelto, given feels like he is unable to afford this medication or difficulty taking it, I instructed him to follow-up with primary care provider  LV thrombus Patient with large  left-ventricular apical thrombus.  INR subtherapeutic.  He was started on IV heparin and transition to Xarelto.  Will have patient follow-up with cardiology.  Severe nonischemic cardiomyopathy EF less than 20% Hypertension History of cardiomyopathy with EF less than 20%.  Cardiology continued on losartan, carvedilol, and added spironolactone.  Discussed with him adherence to low-salt diet will help with preventing acute exacerbations as well as worsening of heart failure.  Patient to follow-up with cardiology outpatient basis.  He will need repeat BMP on outpatient basis to assess electrolytes.  Diabetes mellitus type 2. A1c of 7.9 current regimen of glipizide as well as Januvia.  In setting of heart failure would recommend SGLT2/GLP-1 if patient able to afford.  Continued on these medications once discharged and will follow up with primary care provider   Discharge Subjective:  Rodney Kane states he has tightness in his epigastric area and a new headache. His headache is on his right frontal region and doesn't radiate. Wonders if this is related to frequent spitting. Denies any troubles with  vision, new numbness or weakness. He says his epigastric tightness is new, and feels as if it is swollen with fluid, although denies history of this in the past. He states his stools are smaller volume than usual although brown. Denies history of gallstones, dark or bloody stools. Wonders what foods to eat to make his haert stronger. Continues to have frustration with some difficulty coming up with his words. He feels he can afford Xarelto in the outpatient setting. He states he has some but not all of his medications at home and is waiting on his insurance to go through.   Pharmacy assisting with medication reconciliation of the medications he has here.  Discharge Exam:   BP (!) 110/91 (BP Location: Right Arm)   Pulse 88   Temp (!) 97.3 F (36.3 C) (Oral)   Resp 18   Ht _0  (1.854 m)   Wt 132.9 kg    SpO2 100%   BMI 38.66 kg/m  Constitutional: well-appearing, resting in bed HENT: normocephalic atraumatic, PERRL, EOMI Eyes: conjunctiva non-erythematous Neck: supple Cardiovascular: regular rate and rhythm, no m/r/g Pulmonary/Chest: normal work of breathing on room air Abdominal: soft, non-tender, non-distended. + Bowel sounds MSK: normal bulk and tone Neurological: alert & oriented x 3, initial right-sided facial droop that resolved.  Consistent with yesterday's exam.  Patient without uvular deviation or tongue fasciculations.  Upper extremity strength, right 4/5, left 5/5.  Normal finger-to-nose bilaterally.  No pronator drift.  Lower extremity strength 5/5 bilaterally.  Normal heel-to-shin bilaterally.  Normal sensation throughout.  Patient with expressive aphasia. Skin: warm and dry Psych: Normal mood  Pertinent Labs, Studies, and Procedures:  CBC Latest Ref Rng & Units 09/27/2020 09/26/2020 09/25/2020  WBC 4.0 - 10.5 K/uL 5.8 6.1 -  Hemoglobin 13.0 - 17.0 g/dL 13.8 15.1 17.0  Hematocrit 39.0 - 52.0 % 44.1 46.7 50.0  Platelets 150 - 400 K/uL 221 237 -    CMP Latest Ref Rng & Units 09/27/2020 09/26/2020 09/25/2020  Glucose 70 - 99 mg/dL 126(H) 112(H) 151(H)  BUN 6 - 20 mg/dL 18 15 21(H)  Creatinine 0.61 - 1.24 mg/dL 1.29(H) 1.17 1.30(H)  Sodium 135 - 145 mmol/L 136 137 140  Potassium 3.5 - 5.1 mmol/L 4.1 4.4 4.2  Chloride 98 - 111 mmol/L 102 102 100  CO2 22 - 32 mmol/L 24 22 -  Calcium 8.9 - 10.3 mg/dL 8.9 9.3 -  Total Protein 6.5 - 8.1 g/dL 6.0(L) - -  Total Bilirubin 0.3 - 1.2 mg/dL 1.5(H) - -  Alkaline Phos 38 - 126 U/L 89 - -  AST 15 - 41 U/L 63(H) - -  ALT 0 - 44 U/L 41 - -    CT Code Stroke CTA Head W/WO contrast  Result Date: 09/25/2020 CLINICAL DATA:  Code stroke. 45 year old male status post posterior left MCA/PCA watershed area infarct in December. Acute onset right arm weakness and drift this morning. EXAM: CT ANGIOGRAPHY HEAD AND NECK CT PERFUSION BRAIN TECHNIQUE:  Multidetector CT imaging of the head and neck was performed using the standard protocol during bolus administration of intravenous contrast. Multiplanar CT image reconstructions and MIPs were obtained to evaluate the vascular anatomy. Carotid stenosis measurements (when applicable) are obtained utilizing NASCET criteria, using the distal internal carotid diameter as the denominator. Multiphase CT imaging of the brain was performed following IV bolus contrast injection. Subsequent parametric perfusion maps were calculated using RAPID software. CONTRAST:  185m OMNIPAQUE IOHEXOL 350 MG/ML SOLN COMPARISON:  Plain head CT 0701 hours  today. Brain MRI 07/20/2020 and earlier. CTA head and neck 07/19/2020. FINDINGS: CT Brain Perfusion Findings: ASPECTS: 10 (left hemisphere encephalomalacia). CBF (<30%) Volume: None Perfusion (Tmax>6.0s) volume: 99m, but corresponding to the area of encephalomalacia in the posterior left hemisphere, and therefore felt to be artifactual Mismatch Volume: None suspected to be genuine (as above). Other findings: Suboptimal exam, with incompletely included arterial and venous outflow phase (series 701, image 9). CTA NECK Skeleton: No acute osseous abnormality identified. Upper chest: Small bilateral layering pleural effusions similar to the December appearance. Negative visible lung parenchyma, superior mediastinum. Incidental venous contrast reflux at the left thoracic inlet. Other neck: Paravertebral venous contrast reflux greater on the left and nearly extending to the skull base. Negative neck soft tissue contours. Aortic arch: Suboptimal arterial timing today. Three vessel arch configuration with no arch atherosclerosis. Right carotid system: Suboptimal contrast timing, but otherwise stable and negative compared to December. Left carotid system: Suboptimal contrast timing, and obscured left CCA at the thoracic inlet due to dense venous contrast reflux today. But otherwise stable compared to  December and negative. Vertebral arteries: Suboptimal contrast timing today. Proximal subclavian arteries and vertebral artery origins seem to remain normal. Paravertebral venous contrast reflux also limits V2 segment detail today a especially on the left. Mildly dominant right vertebral artery as before. No stenosis or occlusion is evident in the neck. CTA HEAD Posterior circulation: Suboptimal contrast timing. Distal vertebral arteries and basilar appear patent and stable, mildly dominant right V4. SCA and PCA origins appear stable. Bilateral PCA branches appear stable since December with mild irregularity more apparent in the right P2 segment. Anterior circulation: Both ICA siphons remain patent, with decreased siphon detail today due to contrast timing. Patent carotid termini, MCA and ACA origins. ACA branches are stable and within normal limits. Normal anterior communicating artery. Right MCA M1 segment and bifurcation are patent without stenosis. Right MCA branches are stable. Left MCA M1 segment and bifurcation remain patent. Proximal left MCA branches appear stable since December when allowing for suboptimal contrast timing today. No acute M2 branch occlusion is identified. Venous sinuses: Early contrast timing. Anatomic variants: Mildly dominant right vertebral artery. Review of the MIP images confirms the above findings IMPRESSION: 1. Suboptimal CTA and CTP exams related to contrast timing. No emergent large vessel occlusion identified. No infarct core detected. Erroneously ischemic penumbra on CTP seems to directly correspond to the December left hemisphere infarct. 2. Stable CTA Head and Neck since December when allowing for decreased arterial detail today. 3. Small bilateral layering pleural effusions also similar to the December CTA. Electronically Signed   By: HGenevie AnnM.D.   On: 09/25/2020 07:59   CT Code Stroke CTA Neck W/WO contrast  Result Date: 09/25/2020 CLINICAL DATA:  Code stroke.  45year old male status post posterior left MCA/PCA watershed area infarct in December. Acute onset right arm weakness and drift this morning. EXAM: CT ANGIOGRAPHY HEAD AND NECK CT PERFUSION BRAIN TECHNIQUE: Multidetector CT imaging of the head and neck was performed using the standard protocol during bolus administration of intravenous contrast. Multiplanar CT image reconstructions and MIPs were obtained to evaluate the vascular anatomy. Carotid stenosis measurements (when applicable) are obtained utilizing NASCET criteria, using the distal internal carotid diameter as the denominator. Multiphase CT imaging of the brain was performed following IV bolus contrast injection. Subsequent parametric perfusion maps were calculated using RAPID software. CONTRAST:  1051mOMNIPAQUE IOHEXOL 350 MG/ML SOLN COMPARISON:  Plain head CT 0701 hours today. Brain MRI 07/20/2020 and  earlier. CTA head and neck 07/19/2020. FINDINGS: CT Brain Perfusion Findings: ASPECTS: 10 (left hemisphere encephalomalacia). CBF (<30%) Volume: None Perfusion (Tmax>6.0s) volume: 78m, but corresponding to the area of encephalomalacia in the posterior left hemisphere, and therefore felt to be artifactual Mismatch Volume: None suspected to be genuine (as above). Other findings: Suboptimal exam, with incompletely included arterial and venous outflow phase (series 701, image 9). CTA NECK Skeleton: No acute osseous abnormality identified. Upper chest: Small bilateral layering pleural effusions similar to the December appearance. Negative visible lung parenchyma, superior mediastinum. Incidental venous contrast reflux at the left thoracic inlet. Other neck: Paravertebral venous contrast reflux greater on the left and nearly extending to the skull base. Negative neck soft tissue contours. Aortic arch: Suboptimal arterial timing today. Three vessel arch configuration with no arch atherosclerosis. Right carotid system: Suboptimal contrast timing, but otherwise  stable and negative compared to December. Left carotid system: Suboptimal contrast timing, and obscured left CCA at the thoracic inlet due to dense venous contrast reflux today. But otherwise stable compared to December and negative. Vertebral arteries: Suboptimal contrast timing today. Proximal subclavian arteries and vertebral artery origins seem to remain normal. Paravertebral venous contrast reflux also limits V2 segment detail today a especially on the left. Mildly dominant right vertebral artery as before. No stenosis or occlusion is evident in the neck. CTA HEAD Posterior circulation: Suboptimal contrast timing. Distal vertebral arteries and basilar appear patent and stable, mildly dominant right V4. SCA and PCA origins appear stable. Bilateral PCA branches appear stable since December with mild irregularity more apparent in the right P2 segment. Anterior circulation: Both ICA siphons remain patent, with decreased siphon detail today due to contrast timing. Patent carotid termini, MCA and ACA origins. ACA branches are stable and within normal limits. Normal anterior communicating artery. Right MCA M1 segment and bifurcation are patent without stenosis. Right MCA branches are stable. Left MCA M1 segment and bifurcation remain patent. Proximal left MCA branches appear stable since December when allowing for suboptimal contrast timing today. No acute M2 branch occlusion is identified. Venous sinuses: Early contrast timing. Anatomic variants: Mildly dominant right vertebral artery. Review of the MIP images confirms the above findings IMPRESSION: 1. Suboptimal CTA and CTP exams related to contrast timing. No emergent large vessel occlusion identified. No infarct core detected. Erroneously ischemic penumbra on CTP seems to directly correspond to the December left hemisphere infarct. 2. Stable CTA Head and Neck since December when allowing for decreased arterial detail today. 3. Small bilateral layering pleural  effusions also similar to the December CTA. Electronically Signed   By: HGenevie AnnM.D.   On: 09/25/2020 07:59   MR BRAIN WO CONTRAST  Result Date: 09/25/2020 CLINICAL DATA:  Acute neuro deficit.  Right arm weakness and drift. EXAM: MRI HEAD WITHOUT CONTRAST TECHNIQUE: Multiplanar, multiecho pulse sequences of the brain and surrounding structures were obtained without intravenous contrast. COMPARISON:  MRI head 07/20/2020 FINDINGS: Brain: 5 mm focus of restricted diffusion left cerebellum compatible with acute infarct. Small area of diffusion hyperintensity in the left parietal cortex possibly acute infarct. Chronic hemorrhagic infarct in the left occipital parietal lobe. This showed restricted diffusion on the prior study. Mild white matter changes bilaterally. Chronic microhemorrhage in the left basal ganglia. Correlate with hypertension history. Ventricle size normal. Vascular: Normal arterial flow voids Skull and upper cervical spine: No focal skeletal abnormality. Sinuses/Orbits: Paranasal sinuses clear.  Negative orbit Other: None IMPRESSION: Small acute infarct left cerebellum. Possible acute infarct left parietal cortex Chronic hemorrhagic  infarct left occipital parietal lobe Chronic microhemorrhage left basal ganglia. Correlate with hypertension history Electronically Signed   By: Franchot Gallo M.D.   On: 09/25/2020 11:35   CT Code Stroke Cerebral Perfusion with contrast  Result Date: 09/25/2020 CLINICAL DATA:  Code stroke. 45 year old male status post posterior left MCA/PCA watershed area infarct in December. Acute onset right arm weakness and drift this morning. EXAM: CT ANGIOGRAPHY HEAD AND NECK CT PERFUSION BRAIN TECHNIQUE: Multidetector CT imaging of the head and neck was performed using the standard protocol during bolus administration of intravenous contrast. Multiplanar CT image reconstructions and MIPs were obtained to evaluate the vascular anatomy. Carotid stenosis measurements (when  applicable) are obtained utilizing NASCET criteria, using the distal internal carotid diameter as the denominator. Multiphase CT imaging of the brain was performed following IV bolus contrast injection. Subsequent parametric perfusion maps were calculated using RAPID software. CONTRAST:  185m OMNIPAQUE IOHEXOL 350 MG/ML SOLN COMPARISON:  Plain head CT 0701 hours today. Brain MRI 07/20/2020 and earlier. CTA head and neck 07/19/2020. FINDINGS: CT Brain Perfusion Findings: ASPECTS: 10 (left hemisphere encephalomalacia). CBF (<30%) Volume: None Perfusion (Tmax>6.0s) volume: 255m but corresponding to the area of encephalomalacia in the posterior left hemisphere, and therefore felt to be artifactual Mismatch Volume: None suspected to be genuine (as above). Other findings: Suboptimal exam, with incompletely included arterial and venous outflow phase (series 701, image 9). CTA NECK Skeleton: No acute osseous abnormality identified. Upper chest: Small bilateral layering pleural effusions similar to the December appearance. Negative visible lung parenchyma, superior mediastinum. Incidental venous contrast reflux at the left thoracic inlet. Other neck: Paravertebral venous contrast reflux greater on the left and nearly extending to the skull base. Negative neck soft tissue contours. Aortic arch: Suboptimal arterial timing today. Three vessel arch configuration with no arch atherosclerosis. Right carotid system: Suboptimal contrast timing, but otherwise stable and negative compared to December. Left carotid system: Suboptimal contrast timing, and obscured left CCA at the thoracic inlet due to dense venous contrast reflux today. But otherwise stable compared to December and negative. Vertebral arteries: Suboptimal contrast timing today. Proximal subclavian arteries and vertebral artery origins seem to remain normal. Paravertebral venous contrast reflux also limits V2 segment detail today a especially on the left. Mildly  dominant right vertebral artery as before. No stenosis or occlusion is evident in the neck. CTA HEAD Posterior circulation: Suboptimal contrast timing. Distal vertebral arteries and basilar appear patent and stable, mildly dominant right V4. SCA and PCA origins appear stable. Bilateral PCA branches appear stable since December with mild irregularity more apparent in the right P2 segment. Anterior circulation: Both ICA siphons remain patent, with decreased siphon detail today due to contrast timing. Patent carotid termini, MCA and ACA origins. ACA branches are stable and within normal limits. Normal anterior communicating artery. Right MCA M1 segment and bifurcation are patent without stenosis. Right MCA branches are stable. Left MCA M1 segment and bifurcation remain patent. Proximal left MCA branches appear stable since December when allowing for suboptimal contrast timing today. No acute M2 branch occlusion is identified. Venous sinuses: Early contrast timing. Anatomic variants: Mildly dominant right vertebral artery. Review of the MIP images confirms the above findings IMPRESSION: 1. Suboptimal CTA and CTP exams related to contrast timing. No emergent large vessel occlusion identified. No infarct core detected. Erroneously ischemic penumbra on CTP seems to directly correspond to the December left hemisphere infarct. 2. Stable CTA Head and Neck since December when allowing for decreased arterial detail today. 3. Small bilateral  layering pleural effusions also similar to the December CTA. Electronically Signed   By: Genevie Ann M.D.   On: 09/25/2020 07:59   CT HEAD CODE STROKE WO CONTRAST  Result Date: 09/25/2020 CLINICAL DATA:  Code stroke. 45 year old male status post posterior left MCA/PCA watershed area infarct in December. Acute onset right arm weakness and drift this morning. EXAM: CT HEAD WITHOUT CONTRAST TECHNIQUE: Contiguous axial images were obtained from the base of the skull through the vertex without  intravenous contrast. COMPARISON:  Brain MRI 07/20/2020.  Head CT 07/19/2020. FINDINGS: Brain: Confluent hypodensity in the posterior left temporal and lateral occipital lobes corresponds to diffusion abnormality in December and is compatible with post ischemic encephalomalacia. Mild asymmetry of the lateral ventricles is stable, with no midline shift or intracranial mass effect evident. Gray-white matter differentiation elsewhere is stable. No acute intracranial hemorrhage identified. No acute cortically based infarct identified. Vascular: No suspicious intracranial vascular hyperdensity. Skull: No acute osseous abnormality identified. Sinuses/Orbits: Visualized paranasal sinuses and mastoids are stable and well pneumatized. Other: Stable orbit and scalp soft tissues. ASPECTS [facility] Stroke Program Early CT Score) Total score (0-10 with 10 being normal): 10 (chronic left hemisphere encephalomalacia) IMPRESSION: 1. Expected CT appearance of the left hemisphere infarct since December with encephalomalacia. No acute intracranial abnormality identified. ASPECTS 10. 2. These results were communicated to Dr. Curly Shores at 7:11 am on 09/25/2020 by text page via the [facility] messaging system. Electronically Signed   By: Genevie Ann M.D.   On: 09/25/2020 07:12   ECHOCARDIOGRAM LIMITED  Result Date: 09/25/2020    ECHOCARDIOGRAM LIMITED REPORT   Patient Name:   ALEXANDER AUMENT Date of Exam: 09/25/2020 Medical Rec #:  [id number]             Height:       73.0 in Accession #:    [id number]            Weight:       293.0 lb Date of Birth:  Jan 12, 1976             BSA:          2.531 m Patient Age:    29 years              BP:           131/110 mmHg Patient Gender: M                     HR:           98 bpm. Exam Location:  Inpatient Procedure: Limited Echo, Color Doppler, Cardiac Doppler and Intracardiac            Opacification Agent STAT ECHO Indications:    Stroke  History:        Patient has prior history of Echocardiogram  examinations, most                 recent 07/20/2020. Covid Positive; Risk Factors:Hypertension,                 Dyslipidemia and Diabetes.  Sonographer:    Johny Chess Referring Phys: [id number] [place]  1. 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 The apical thrombus was confirmed with Definity contrast.     . Left ventricular ejection fraction, by estimation, is <20%. The left ventricle has severely decreased function. The left ventricle demonstrates regional wall motion abnormalities (see scoring diagram/findings for description). Left ventricular diastolic parameters  are consistent with Grade III diastolic dysfunction (restrictive). There is severe akinesis of the left ventricular, mid-apical lateral wall, anteroseptal wall and apical segment.  2. The mitral valve is normal in structure. Trivial mitral valve regurgitation. No evidence of mitral stenosis.  3. The aortic valve is normal in structure. Aortic valve regurgitation is not visualized. No aortic stenosis is present.  4. There is mildly elevated pulmonary artery systolic pressure. 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 The apical thrombus was confirmed with Definity contrast. Left ventricular ejection fraction, by estimation, is <20%. The left ventricle has severely decreased function. The left ventricle demonstrates regional wall motion abnormalities. Severe akinesis of the left ventricular, mid-apical lateral wall, anteroseptal wall and apical segment. Definity contrast agent was given IV to delineate the left ventricular endocardial borders. Left ventricular diastolic parameters are consistent with Grade III diastolic dysfunction (restrictive). Right Ventricle: There is mildly elevated pulmonary artery systolic pressure. The tricuspid regurgitant velocity is 2.61 m/s, and with an assumed right atrial pressure of 15 mmHg, the estimated right ventricular systolic pressure is 44.0  mmHg. Mitral Valve: The mitral valve is normal in structure. Trivial mitral valve regurgitation. No evidence of mitral valve stenosis. Tricuspid Valve: The tricuspid valve is normal in structure. Tricuspid valve regurgitation is mild. Aortic Valve: The aortic valve is normal in structure. Aortic valve regurgitation is not visualized. No aortic stenosis is present. Pulmonic Valve: The pulmonic valve was normal in structure. Pulmonic valve regurgitation is not visualized. LEFT VENTRICLE PLAX 2D LVIDd:         6.80 cm LVIDs:         5.70 cm LV PW:         1.10 cm LV IVS:        1.00 cm LVOT diam:     2.30 cm LV SV:         28 LV SV Index:   11 LVOT Area:     4.15 cm  IVC IVC diam: 2.80 cm LEFT ATRIUM         Index LA diam:    4.40 cm 1.74 cm/m  AORTIC VALVE LVOT Vmax:   47.00 cm/s LVOT Vmean:  31.200 cm/s LVOT VTI:    0.067 m TRICUSPID VALVE TR Peak grad:   27.2 mmHg TR Vmax:        261.00 cm/s  SHUNTS Systemic VTI:  0.07 m Systemic Diam: 2.30 cm Mertie Moores MD Electronically signed by Mertie Moores MD Signature Date/Time: 09/25/2020/1:23:51 PM    Final      Discharge Instructions: Discharge Instructions    Call MD for:  extreme fatigue   Complete by: As directed    Call MD for:  persistant dizziness or light-headedness   Complete by: As directed    Call MD for:  persistant nausea and vomiting   Complete by: As directed    Call MD for:  temperature >100.4   Complete by: As directed    Diet - low sodium heart healthy   Complete by: As directed    Discharge instructions   Complete by: As directed    Rodney Kane thank you for allowing [place] to care for you today.  It appears as though you had another stroke, secondary to the blood clot that is in your left ventricle of your heart.  It is of better importance that you continue to take your Xarelto.  If you ever feel like you cannot afford this medication  please please call your primary care provider.  This will be absolutely important for you to stay on this  medication to prevent further strokes.  Please follow-up with your primary care provider as well as your cardiologist and the neurologist.  I have put their information in your discharge packet.  Please call these offices and schedule appointments to be seen.  If you notice any other new signs of weakness or feel as though you are having a medical emergency, please call 911 or go to the closest emergency department.  Otherwise please continue to take all of your medications and follow-up with your primary care provider within a week   Increase activity slowly   Complete by: As directed       Signed: Riesa Pope, MD 09/27/2020, 3:20 PM   Pager: [phone]

## 2020-09-27 NOTE — Assessment & Plan Note (Signed)
Continue warfarin.  Due to have labs checked today.

## 2020-09-27 NOTE — Discharge Instructions (Signed)
Xarelto Copay CardJohnney Kane: [id number] GU:[phone] Group: [id number]  Information on my medicine - XARELTO (Rivaroxaban)  This medication education was reviewed with me or my healthcare representative as part of my discharge preparation.  The pharmacist that spoke with me during my hospital stay was:  Ulyses Southward, RPH-CPP  Why was Xarelto prescribed for you? Xarelto was prescribed for you to reduce the risk of a blood clot forming that can cause a stroke if you have a medical condition called atrial fibrillation (a type of irregular heartbeat).  What do you need to know about xarelto ? Take your Xarelto ONCE DAILY at the same time every day with your evening meal. If you have difficulty swallowing the tablet whole, you may crush it and mix in applesauce just prior to taking your dose.  Take Xarelto exactly as prescribed by your doctor and DO NOT stop taking Xarelto without talking to the doctor who prescribed the medication.  Stopping without other stroke prevention medication to take the place of Xarelto may increase your risk of developing a clot that causes a stroke.  Refill your prescription before you run out.  After discharge, you should have regular check-up appointments with your healthcare provider that is prescribing your Xarelto.  In the future your dose may need to be changed if your kidney function or weight changes by a significant amount.  What do you do if you miss a dose? If you are taking Xarelto ONCE DAILY and you miss a dose, take it as soon as you remember on the same day then continue your regularly scheduled once daily regimen the next day. Do not take two doses of Xarelto at the same time or on the same day.   Important Safety Information A possible side effect of Xarelto is bleeding. You should call your healthcare provider right away if you experience any of the following: ? Bleeding from an injury or your nose that does not stop. ? Unusual colored urine  (red or dark brown) or unusual colored stools (red or black). ? Unusual bruising for unknown reasons. ? A serious fall or if you hit your head (even if there is no bleeding).  Some medicines may interact with Xarelto and might increase your risk of bleeding while on Xarelto. To help avoid this, consult your healthcare provider or pharmacist prior to using any new prescription or non-prescription medications, including herbals, vitamins, non-steroidal anti-inflammatory drugs (NSAIDs) and supplements.  This website has more information on Xarelto: VisitDestination.com.br.

## 2020-09-27 NOTE — Assessment & Plan Note (Signed)
Not as well-controlled as well as we would like.  Plan:   Increase losartan to 50 mg daily.  Cost assessment evaluation for switching to [facility].  Ensure that he is taking spironolactone 25 mg daily (pending potassium).  Continue current dose of carvedilol.  Once he is on Entresto, can titrate further.

## 2020-09-27 NOTE — Plan of Care (Signed)
  Problem: Education: Goal: Knowledge of disease or condition will improve Outcome: Progressing Goal: Knowledge of secondary prevention will improve Outcome: Progressing Goal: Knowledge of patient specific risk factors addressed and post discharge goals established will improve Outcome: Progressing Goal: Individualized Educational Video(s) Outcome: Progressing   Problem: Coping: Goal: Will verbalize positive feelings about self Outcome: Progressing Goal: Will identify appropriate support needs Outcome: Progressing   Problem: Health Behavior/Discharge Planning: Goal: Ability to manage health-related needs will improve Outcome: Progressing   Problem: Self-Care: Goal: Ability to participate in self-care as condition permits will improve Outcome: Progressing Goal: Verbalization of feelings and concerns over difficulty with self-care will improve Outcome: Progressing Goal: Ability to communicate needs accurately will improve Outcome: Progressing   Problem: Nutrition: Goal: Risk of aspiration will decrease Outcome: Progressing   Problem: Ischemic Stroke/TIA Tissue Perfusion: Goal: Complications of ischemic stroke/TIA will be minimized Outcome: Progressing   

## 2020-09-27 NOTE — Assessment & Plan Note (Signed)
I admonished him on the importance of staying on medications.  His heart will only get worse if he does not take medicines correctly.

## 2020-09-27 NOTE — Assessment & Plan Note (Signed)
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

## 2020-09-27 NOTE — Assessment & Plan Note (Addendum)
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  Persistently reduced EF now with LV thrombus. One major issue is medical adherence.  His blood pressure is 130/100.  He has moderate dose carvedilol and low-dose losartan listed along with spironolactone.  Apparently has not taken spironolactone.  Plan: Need labs today with [place] to adjust medications,  Titrate losartan to 50 mg, would hope to convert to [facility] if cost TAVR July.  We also have room to probably increase carvedilol up to 12.5 mg twice daily  Make sure that he is taking spironolactone (want to make sure we see what his potassium level is).  He needs more aggressive diuresis-we will increase to 80 mg daily for a week and then we will then convert to alternating 80 mg 1 day and 40 mg a day.

## 2020-09-27 NOTE — Discharge Summary (Signed)
Name: Rodney Kane MRN: [id number] DOB: [birth year] 45 y.o. PCP: Azzie Glatter, FNP  Date of Admission: 09/25/2020  6:46 AM Date of Discharge: 09/27/2020 Attending Physician: Dr. Daryll Drown  Discharge Diagnosis: Active Problems:   Nonischemic cardiomyopathy ([place])   LV (left ventricular) mural thrombus   CVA (cerebral vascular accident) [name])   Hyperlipidemia    Discharge Medications: Allergies as of 09/27/2020   No Known Allergies     Medication List    STOP taking these medications   blood glucose meter kit and supplies   insulin aspart 100 UNIT/ML injection Commonly known as: NovoLOG   insulin detemir 100 UNIT/ML injection Commonly known as: Levemir   warfarin 5 MG tablet Commonly known as: COUMADIN     TAKE these medications   albuterol 108 (90 Base) MCG/ACT inhaler Commonly known as: VENTOLIN HFA Inhale 2 puffs into the lungs every 6 (six) hours as needed for wheezing or shortness of breath.   atorvastatin 10 MG tablet Commonly known as: LIPITOR Take 1 tablet (10 mg total) by mouth daily.   carvedilol 12.5 MG tablet Commonly known as: COREG Take 1 tablet (12.5 mg total) by mouth 2 (two) times daily with a meal.   cetirizine 10 MG tablet Commonly known as: ZyrTEC Allergy Take 1 tablet (10 mg total) by mouth daily.   furosemide 40 MG tablet Commonly known as: LASIX Take 80 mg  ( 2 tablets of 40 mg) every other day with 40 mg (1 tablet) the opposite day   glipiZIDE 10 MG tablet Commonly known as: GLUCOTROL Take 1 tablet (10 mg total) by mouth 2 (two) times daily before a meal.   hydrocortisone cream 1 % Apply 1 application topically 2 (two) times daily.   hydrOXYzine 10 MG tablet Commonly known as: ATARAX/VISTARIL Take 1 tablet (10 mg total) by mouth 3 (three) times daily as needed.   losartan 50 MG tablet Commonly known as: COZAAR Take 1 tablet (50 mg total) by mouth 2 (two) times daily.   polyethylene glycol powder 17 GM/SCOOP  powder Commonly known as: GLYCOLAX/MIRALAX Take 17 g by mouth daily.   potassium chloride SA 20 MEQ tablet Commonly known as: KLOR-CON Take 1 tablet (20 mEq total) by mouth daily.   rivaroxaban 20 MG Tabs tablet Commonly known as: XARELTO Take 1 tablet (20 mg total) by mouth daily with supper.   sitaGLIPtin 25 MG tablet Commonly known as: Januvia Take 1 tablet (25 mg total) by mouth daily.   spironolactone 25 MG tablet Commonly known as: ALDACTONE Take 0.5 tablets (12.5 mg total) by mouth daily. What changed: how much to take   Vitamin D (Ergocalciferol) 1.25 MG (50000 UNIT) Caps capsule Commonly known as: DRISDOL Take 1 capsule (50,000 Units total) by mouth every 7 (seven) days. What changed: additional instructions   zinc sulfate 220 (50 Zn) MG capsule Take 1 capsule (220 mg total) by mouth daily.       Disposition and follow-up:   Rodney Kane was discharged from [facility] in Stable condition.  At the hospital follow up visit please address:  1.  Follow-up:  A.  Acute CVA-patient to follow-up with neurology and PCP    B.  LV thrombus-patient to follow-up with cardiology as well as PCP.   C.  Diabetes-follow-up with primary care provider for medication   D.  Severe nonischemic cardiomyopathy-follow-up with cardiology consider        SGLT2/GLP-1   E.  Electrolyte abnormalities-repeat BMP  2.  Labs / imaging needed at time of follow-up: BMP, liver enzymes, discharge, hep C, viral hepatitides.  3.  Pending labs/ test needing follow-up: None  4.  Medication Changes  Started: Xarelto 15 mgs BID, spironolactone 12.5 mg daily  Stopped: Warfarin  Changed: None  Abx -none   Follow-up Appointments:  Follow-up Information    Azzie Glatter, FNP. Schedule an appointment as soon as possible for a visit.   Specialty: Family Medicine Why: Please send any FMLA/Short Term Disability paperwork to office fax at [phone] (no emails  accepted). Paperwork should be completed in 7-14 days, call if any additional questions! Contact information: [place] [address] [phone]        Leonie Man, MD. Schedule an appointment as soon as possible for a visit.   Specialty: Cardiology Contact information: [address] [place] [address] [place]. Schedule an appointment as soon as possible for a visit.   Contact information: [address]     [address] [place] [id number] [place] [facility] Course by problem list:   Acute infarct of left cerebellum, and left parietal cortex Subtherapeutic anticoagulation Patient presented with right arm weakness.  He did endorse nonadherence to his anticoagulation regimen secondary to cost.  His weakness improved on admission MRI revealed new strokes in multiple areas consistent with embolic pathology.  Recent INR showed subtherapeutic levels.  Cardiology and neurology were both consulted.  On exam patient was found to have 4/5 right upper extremity weakness.  Also noted to have right-sided facial droop that resolved during the exam.  Echocardiogram revealed large apical mural thrombus in the LV.  LDL of 50 and hemoglobin A1c of 7.9.  Patient will need to be adherent to medication regimen in order to prevent further emboli.  Discussions had with all specialists involved including pharmacist who recommended Xarelto.  Patient had discussions with transitions of care about affordability, and will be charged $10 a month for Xarelto.  Also need optimization of his diabetes as per below.  Plan to follow-up with cardiology as well as neurology and PCP on outpatient basis.  Emphasized patient importance of staying on his Xarelto, given feels like he is unable to afford this medication or difficulty taking it, I instructed him to follow-up with primary care provider  LV  thrombus Patient with large left-ventricular apical thrombus.  INR subtherapeutic.  He was started on IV heparin and transition to Xarelto.  Will have patient follow-up with cardiology.  Severe nonischemic cardiomyopathy EF less than 20% Hypertension History of cardiomyopathy with EF less than 20%.  Cardiology continued on losartan, carvedilol, and added spironolactone.  Discussed with him adherence to low-salt diet will help with preventing acute exacerbations as well as worsening of heart failure.  Patient to follow-up with cardiology outpatient basis.  He will need repeat BMP on outpatient basis to assess electrolytes.  Diabetes mellitus type 2. A1c of 7.9 current regimen of glipizide as well as Januvia.  In setting of heart failure would recommend SGLT2/GLP-1 if patient able to afford.  Continued on these medications once discharged and will follow up with primary care provider  Hyperbilirubinemia Patient endorses intermittent right upper quadrant abdominal pain.  He notes that the pain is worse after meals, he feels a bloating sensation.  On my exam he has nontender.  On initial presentation he did  have elevated AST and ALT that have resolved.  He does have elevated total bilirubin as well as direct, indirect.  Recommend patient follow-up with primary care provider and have hepatitis C screening as well as viral hepatitides.  Other possible etiology is from his heart failure.   Discharge Subjective:  Rodney Kane states he has tightness in his epigastric area and a new headache. His headache is on his right frontal region and doesn't radiate. Wonders if this is related to frequent spitting. Denies any troubles with vision, new numbness or weakness. He says his epigastric tightness is new, and feels as if it is swollen with fluid, although denies history of this in the past. He states his stools are smaller volume than usual although brown. Denies history of gallstones, dark or bloody stools.  Wonders what foods to eat to make his haert stronger. Continues to have frustration with some difficulty coming up with his words. He feels he can afford Xarelto in the outpatient setting. He states he has some but not all of his medications at home and is waiting on his insurance to go through.   Pharmacy assisting with medication reconciliation of the medications he has here.  Discharge Exam:   BP (!) 110/91 (BP Location: Right Arm)   Pulse 88   Temp (!) 97.3 F (36.3 C) (Oral)   Resp 18   Ht $R'6\' 1"'Oo$  (1.854 m)   Wt 132.9 kg   SpO2 100%   BMI 38.66 kg/m  Constitutional: well-appearing, resting in bed HENT: normocephalic atraumatic, PERRL, EOMI Eyes: conjunctiva non-erythematous Neck: supple Cardiovascular: regular rate and rhythm, no m/r/g Pulmonary/Chest: normal work of breathing on room air Abdominal: soft, non-tender, non-distended. + Bowel sounds MSK: normal bulk and tone Neurological: alert & oriented x 3, initial right-sided facial droop that resolved.  Consistent with yesterday's exam.  Patient without uvular deviation or tongue fasciculations.  Upper extremity strength, right 4/5, left 5/5.  Normal finger-to-nose bilaterally.  No pronator drift.  Lower extremity strength 5/5 bilaterally.  Normal heel-to-shin bilaterally.  Normal sensation throughout.  Patient with expressive aphasia. Skin: warm and dry Psych: Normal mood  Pertinent Labs, Studies, and Procedures:  CBC Latest Ref Rng & Units 09/27/2020 09/26/2020 09/25/2020  WBC 4.0 - 10.5 K/uL 5.8 6.1 -  Hemoglobin 13.0 - 17.0 g/dL 13.8 15.1 17.0  Hematocrit 39.0 - 52.0 % 44.1 46.7 50.0  Platelets 150 - 400 K/uL 221 237 -    CMP Latest Ref Rng & Units 09/27/2020 09/26/2020 09/25/2020  Glucose 70 - 99 mg/dL 126(H) 112(H) 151(H)  BUN 6 - 20 mg/dL 18 15 21(H)  Creatinine 0.61 - 1.24 mg/dL 1.29(H) 1.17 1.30(H)  Sodium 135 - 145 mmol/L 136 137 140  Potassium 3.5 - 5.1 mmol/L 4.1 4.4 4.2  Chloride 98 - 111 mmol/L 102 102 100  CO2 22 -  32 mmol/L 24 22 -  Calcium 8.9 - 10.3 mg/dL 8.9 9.3 -  Total Protein 6.5 - 8.1 g/dL 6.0(L) - -  Total Bilirubin 0.3 - 1.2 mg/dL 1.5(H) - -  Alkaline Phos 38 - 126 U/L 89 - -  AST 15 - 41 U/L 63(H) - -  ALT 0 - 44 U/L 41 - -    CT Code Stroke CTA Head W/WO contrast  Result Date: 09/25/2020 CLINICAL DATA:  Code stroke. 45 year old male status post posterior left MCA/PCA watershed area infarct in December. Acute onset right arm weakness and drift this morning. EXAM: CT ANGIOGRAPHY HEAD AND NECK CT PERFUSION BRAIN TECHNIQUE:  Multidetector CT imaging of the head and neck was performed using the standard protocol during bolus administration of intravenous contrast. Multiplanar CT image reconstructions and MIPs were obtained to evaluate the vascular anatomy. Carotid stenosis measurements (when applicable) are obtained utilizing NASCET criteria, using the distal internal carotid diameter as the denominator. Multiphase CT imaging of the brain was performed following IV bolus contrast injection. Subsequent parametric perfusion maps were calculated using RAPID software. CONTRAST:  122mL OMNIPAQUE IOHEXOL 350 MG/ML SOLN COMPARISON:  Plain head CT 0701 hours today. Brain MRI 07/20/2020 and earlier. CTA head and neck 07/19/2020. FINDINGS: CT Brain Perfusion Findings: ASPECTS: 10 (left hemisphere encephalomalacia). CBF (<30%) Volume: None Perfusion (Tmax>6.0s) volume: 47mL, but corresponding to the area of encephalomalacia in the posterior left hemisphere, and therefore felt to be artifactual Mismatch Volume: None suspected to be genuine (as above). Other findings: Suboptimal exam, with incompletely included arterial and venous outflow phase (series 701, image 9). CTA NECK Skeleton: No acute osseous abnormality identified. Upper chest: Small bilateral layering pleural effusions similar to the December appearance. Negative visible lung parenchyma, superior mediastinum. Incidental venous contrast reflux at the left  thoracic inlet. Other neck: Paravertebral venous contrast reflux greater on the left and nearly extending to the skull base. Negative neck soft tissue contours. Aortic arch: Suboptimal arterial timing today. Three vessel arch configuration with no arch atherosclerosis. Right carotid system: Suboptimal contrast timing, but otherwise stable and negative compared to December. Left carotid system: Suboptimal contrast timing, and obscured left CCA at the thoracic inlet due to dense venous contrast reflux today. But otherwise stable compared to December and negative. Vertebral arteries: Suboptimal contrast timing today. Proximal subclavian arteries and vertebral artery origins seem to remain normal. Paravertebral venous contrast reflux also limits V2 segment detail today a especially on the left. Mildly dominant right vertebral artery as before. No stenosis or occlusion is evident in the neck. CTA HEAD Posterior circulation: Suboptimal contrast timing. Distal vertebral arteries and basilar appear patent and stable, mildly dominant right V4. SCA and PCA origins appear stable. Bilateral PCA branches appear stable since December with mild irregularity more apparent in the right P2 segment. Anterior circulation: Both ICA siphons remain patent, with decreased siphon detail today due to contrast timing. Patent carotid termini, MCA and ACA origins. ACA branches are stable and within normal limits. Normal anterior communicating artery. Right MCA M1 segment and bifurcation are patent without stenosis. Right MCA branches are stable. Left MCA M1 segment and bifurcation remain patent. Proximal left MCA branches appear stable since December when allowing for suboptimal contrast timing today. No acute M2 branch occlusion is identified. Venous sinuses: Early contrast timing. Anatomic variants: Mildly dominant right vertebral artery. Review of the MIP images confirms the above findings IMPRESSION: 1. Suboptimal CTA and CTP exams related  to contrast timing. No emergent large vessel occlusion identified. No infarct core detected. Erroneously ischemic penumbra on CTP seems to directly correspond to the December left hemisphere infarct. 2. Stable CTA Head and Neck since December when allowing for decreased arterial detail today. 3. Small bilateral layering pleural effusions also similar to the December CTA. Electronically Signed   By: Genevie Ann M.D.   On: 09/25/2020 07:59   CT Code Stroke CTA Neck W/WO contrast  Result Date: 09/25/2020 CLINICAL DATA:  Code stroke. 45 year old male status post posterior left MCA/PCA watershed area infarct in December. Acute onset right arm weakness and drift this morning. EXAM: CT ANGIOGRAPHY HEAD AND NECK CT PERFUSION BRAIN TECHNIQUE: Multidetector CT imaging of the  head and neck was performed using the standard protocol during bolus administration of intravenous contrast. Multiplanar CT image reconstructions and MIPs were obtained to evaluate the vascular anatomy. Carotid stenosis measurements (when applicable) are obtained utilizing NASCET criteria, using the distal internal carotid diameter as the denominator. Multiphase CT imaging of the brain was performed following IV bolus contrast injection. Subsequent parametric perfusion maps were calculated using RAPID software. CONTRAST:  143mL OMNIPAQUE IOHEXOL 350 MG/ML SOLN COMPARISON:  Plain head CT 0701 hours today. Brain MRI 07/20/2020 and earlier. CTA head and neck 07/19/2020. FINDINGS: CT Brain Perfusion Findings: ASPECTS: 10 (left hemisphere encephalomalacia). CBF (<30%) Volume: None Perfusion (Tmax>6.0s) volume: 3mL, but corresponding to the area of encephalomalacia in the posterior left hemisphere, and therefore felt to be artifactual Mismatch Volume: None suspected to be genuine (as above). Other findings: Suboptimal exam, with incompletely included arterial and venous outflow phase (series 701, image 9). CTA NECK Skeleton: No acute osseous abnormality  identified. Upper chest: Small bilateral layering pleural effusions similar to the December appearance. Negative visible lung parenchyma, superior mediastinum. Incidental venous contrast reflux at the left thoracic inlet. Other neck: Paravertebral venous contrast reflux greater on the left and nearly extending to the skull base. Negative neck soft tissue contours. Aortic arch: Suboptimal arterial timing today. Three vessel arch configuration with no arch atherosclerosis. Right carotid system: Suboptimal contrast timing, but otherwise stable and negative compared to December. Left carotid system: Suboptimal contrast timing, and obscured left CCA at the thoracic inlet due to dense venous contrast reflux today. But otherwise stable compared to December and negative. Vertebral arteries: Suboptimal contrast timing today. Proximal subclavian arteries and vertebral artery origins seem to remain normal. Paravertebral venous contrast reflux also limits V2 segment detail today a especially on the left. Mildly dominant right vertebral artery as before. No stenosis or occlusion is evident in the neck. CTA HEAD Posterior circulation: Suboptimal contrast timing. Distal vertebral arteries and basilar appear patent and stable, mildly dominant right V4. SCA and PCA origins appear stable. Bilateral PCA branches appear stable since December with mild irregularity more apparent in the right P2 segment. Anterior circulation: Both ICA siphons remain patent, with decreased siphon detail today due to contrast timing. Patent carotid termini, MCA and ACA origins. ACA branches are stable and within normal limits. Normal anterior communicating artery. Right MCA M1 segment and bifurcation are patent without stenosis. Right MCA branches are stable. Left MCA M1 segment and bifurcation remain patent. Proximal left MCA branches appear stable since December when allowing for suboptimal contrast timing today. No acute M2 branch occlusion is  identified. Venous sinuses: Early contrast timing. Anatomic variants: Mildly dominant right vertebral artery. Review of the MIP images confirms the above findings IMPRESSION: 1. Suboptimal CTA and CTP exams related to contrast timing. No emergent large vessel occlusion identified. No infarct core detected. Erroneously ischemic penumbra on CTP seems to directly correspond to the December left hemisphere infarct. 2. Stable CTA Head and Neck since December when allowing for decreased arterial detail today. 3. Small bilateral layering pleural effusions also similar to the December CTA. Electronically Signed   By: Genevie Ann M.D.   On: 09/25/2020 07:59   MR BRAIN WO CONTRAST  Result Date: 09/25/2020 CLINICAL DATA:  Acute neuro deficit.  Right arm weakness and drift. EXAM: MRI HEAD WITHOUT CONTRAST TECHNIQUE: Multiplanar, multiecho pulse sequences of the brain and surrounding structures were obtained without intravenous contrast. COMPARISON:  MRI head 07/20/2020 FINDINGS: Brain: 5 mm focus of restricted diffusion left cerebellum compatible  with acute infarct. Small area of diffusion hyperintensity in the left parietal cortex possibly acute infarct. Chronic hemorrhagic infarct in the left occipital parietal lobe. This showed restricted diffusion on the prior study. Mild white matter changes bilaterally. Chronic microhemorrhage in the left basal ganglia. Correlate with hypertension history. Ventricle size normal. Vascular: Normal arterial flow voids Skull and upper cervical spine: No focal skeletal abnormality. Sinuses/Orbits: Paranasal sinuses clear.  Negative orbit Other: None IMPRESSION: Small acute infarct left cerebellum. Possible acute infarct left parietal cortex Chronic hemorrhagic infarct left occipital parietal lobe Chronic microhemorrhage left basal ganglia. Correlate with hypertension history Electronically Signed   By: Franchot Gallo M.D.   On: 09/25/2020 11:35   CT Code Stroke Cerebral Perfusion with  contrast  Result Date: 09/25/2020 CLINICAL DATA:  Code stroke. 45 year old male status post posterior left MCA/PCA watershed area infarct in December. Acute onset right arm weakness and drift this morning. EXAM: CT ANGIOGRAPHY HEAD AND NECK CT PERFUSION BRAIN TECHNIQUE: Multidetector CT imaging of the head and neck was performed using the standard protocol during bolus administration of intravenous contrast. Multiplanar CT image reconstructions and MIPs were obtained to evaluate the vascular anatomy. Carotid stenosis measurements (when applicable) are obtained utilizing NASCET criteria, using the distal internal carotid diameter as the denominator. Multiphase CT imaging of the brain was performed following IV bolus contrast injection. Subsequent parametric perfusion maps were calculated using RAPID software. CONTRAST:  163mL OMNIPAQUE IOHEXOL 350 MG/ML SOLN COMPARISON:  Plain head CT 0701 hours today. Brain MRI 07/20/2020 and earlier. CTA head and neck 07/19/2020. FINDINGS: CT Brain Perfusion Findings: ASPECTS: 10 (left hemisphere encephalomalacia). CBF (<30%) Volume: None Perfusion (Tmax>6.0s) volume: 59mL, but corresponding to the area of encephalomalacia in the posterior left hemisphere, and therefore felt to be artifactual Mismatch Volume: None suspected to be genuine (as above). Other findings: Suboptimal exam, with incompletely included arterial and venous outflow phase (series 701, image 9). CTA NECK Skeleton: No acute osseous abnormality identified. Upper chest: Small bilateral layering pleural effusions similar to the December appearance. Negative visible lung parenchyma, superior mediastinum. Incidental venous contrast reflux at the left thoracic inlet. Other neck: Paravertebral venous contrast reflux greater on the left and nearly extending to the skull base. Negative neck soft tissue contours. Aortic arch: Suboptimal arterial timing today. Three vessel arch configuration with no arch atherosclerosis.  Right carotid system: Suboptimal contrast timing, but otherwise stable and negative compared to December. Left carotid system: Suboptimal contrast timing, and obscured left CCA at the thoracic inlet due to dense venous contrast reflux today. But otherwise stable compared to December and negative. Vertebral arteries: Suboptimal contrast timing today. Proximal subclavian arteries and vertebral artery origins seem to remain normal. Paravertebral venous contrast reflux also limits V2 segment detail today a especially on the left. Mildly dominant right vertebral artery as before. No stenosis or occlusion is evident in the neck. CTA HEAD Posterior circulation: Suboptimal contrast timing. Distal vertebral arteries and basilar appear patent and stable, mildly dominant right V4. SCA and PCA origins appear stable. Bilateral PCA branches appear stable since December with mild irregularity more apparent in the right P2 segment. Anterior circulation: Both ICA siphons remain patent, with decreased siphon detail today due to contrast timing. Patent carotid termini, MCA and ACA origins. ACA branches are stable and within normal limits. Normal anterior communicating artery. Right MCA M1 segment and bifurcation are patent without stenosis. Right MCA branches are stable. Left MCA M1 segment and bifurcation remain patent. Proximal left MCA branches appear stable since December  when allowing for suboptimal contrast timing today. No acute M2 branch occlusion is identified. Venous sinuses: Early contrast timing. Anatomic variants: Mildly dominant right vertebral artery. Review of the MIP images confirms the above findings IMPRESSION: 1. Suboptimal CTA and CTP exams related to contrast timing. No emergent large vessel occlusion identified. No infarct core detected. Erroneously ischemic penumbra on CTP seems to directly correspond to the December left hemisphere infarct. 2. Stable CTA Head and Neck since December when allowing for decreased  arterial detail today. 3. Small bilateral layering pleural effusions also similar to the December CTA. Electronically Signed   By: Odessa Fleming M.D.   On: 09/25/2020 07:59   CT HEAD CODE STROKE WO CONTRAST  Result Date: 09/25/2020 CLINICAL DATA:  Code stroke. 45 year old male status post posterior left MCA/PCA watershed area infarct in December. Acute onset right arm weakness and drift this morning. EXAM: CT HEAD WITHOUT CONTRAST TECHNIQUE: Contiguous axial images were obtained from the base of the skull through the vertex without intravenous contrast. COMPARISON:  Brain MRI 07/20/2020.  Head CT 07/19/2020. FINDINGS: Brain: Confluent hypodensity in the posterior left temporal and lateral occipital lobes corresponds to diffusion abnormality in December and is compatible with post ischemic encephalomalacia. Mild asymmetry of the lateral ventricles is stable, with no midline shift or intracranial mass effect evident. Gray-white matter differentiation elsewhere is stable. No acute intracranial hemorrhage identified. No acute cortically based infarct identified. Vascular: No suspicious intracranial vascular hyperdensity. Skull: No acute osseous abnormality identified. Sinuses/Orbits: Visualized paranasal sinuses and mastoids are stable and well pneumatized. Other: Stable orbit and scalp soft tissues. ASPECTS [facility] Stroke Program Early CT Score) Total score (0-10 with 10 being normal): 10 (chronic left hemisphere encephalomalacia) IMPRESSION: 1. Expected CT appearance of the left hemisphere infarct since December with encephalomalacia. No acute intracranial abnormality identified. ASPECTS 10. 2. These results were communicated to Dr. Iver Nestle at 7:11 am on 09/25/2020 by text page via the [facility] messaging system. Electronically Signed   By: Odessa Fleming M.D.   On: 09/25/2020 07:12   ECHOCARDIOGRAM LIMITED  Result Date: 09/25/2020    ECHOCARDIOGRAM LIMITED REPORT   Patient Name:   JUANLUIS GUASTELLA Date of Exam: 09/25/2020  Medical Rec #:  [id number]             Height:       73.0 in Accession #:    [id number]            Weight:       293.0 lb Date of Birth:  August 12, 1975             BSA:          2.531 m Patient Age:    44 years              BP:           131/110 mmHg Patient Gender: M                     HR:           98 bpm. Exam Location:  Inpatient Procedure: Limited Echo, Color Doppler, Cardiac Doppler and Intracardiac            Opacification Agent STAT ECHO Indications:    Stroke  History:        Patient has prior history of Echocardiogram examinations, most                 recent 07/20/2020. Covid Positive; Risk Factors:Hypertension,  Dyslipidemia and Diabetes.  Sonographer:    Delcie Roch Referring Phys: [id number] SRISHTI L BHAGAT IMPRESSIONS  1. 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 The apical thrombus was confirmed with Definity contrast.     . Left ventricular ejection fraction, by estimation, is <20%. The left ventricle has severely decreased function. The left ventricle demonstrates regional wall motion abnormalities (see scoring diagram/findings for description). Left ventricular diastolic parameters are consistent with Grade III diastolic dysfunction (restrictive). There is severe akinesis of the left ventricular, mid-apical lateral wall, anteroseptal wall and apical segment.  2. The mitral valve is normal in structure. Trivial mitral valve regurgitation. No evidence of mitral stenosis.  3. The aortic valve is normal in structure. Aortic valve regurgitation is not visualized. No aortic stenosis is present.  4. There is mildly elevated pulmonary artery systolic pressure. 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 The apical thrombus was confirmed with Definity contrast. Left ventricular ejection fraction, by estimation, is <20%. The left ventricle has severely decreased function. The left ventricle demonstrates regional wall motion abnormalities. Severe  akinesis of the left ventricular, mid-apical lateral wall, anteroseptal wall and apical segment. Definity contrast agent was given IV to delineate the left ventricular endocardial borders. Left ventricular diastolic parameters are consistent with Grade III diastolic dysfunction (restrictive). Right Ventricle: There is mildly elevated pulmonary artery systolic pressure. The tricuspid regurgitant velocity is 2.61 m/s, and with an assumed right atrial pressure of 15 mmHg, the estimated right ventricular systolic pressure is 42.2 mmHg. Mitral Valve: The mitral valve is normal in structure. Trivial mitral valve regurgitation. No evidence of mitral valve stenosis. Tricuspid Valve: The tricuspid valve is normal in structure. Tricuspid valve regurgitation is mild. Aortic Valve: The aortic valve is normal in structure. Aortic valve regurgitation is not visualized. No aortic stenosis is present. Pulmonic Valve: The pulmonic valve was normal in structure. Pulmonic valve regurgitation is not visualized. LEFT VENTRICLE PLAX 2D LVIDd:         6.80 cm LVIDs:         5.70 cm LV PW:         1.10 cm LV IVS:        1.00 cm LVOT diam:     2.30 cm LV SV:         28 LV SV Index:   11 LVOT Area:     4.15 cm  IVC IVC diam: 2.80 cm LEFT ATRIUM         Index LA diam:    4.40 cm 1.74 cm/m  AORTIC VALVE LVOT Vmax:   47.00 cm/s LVOT Vmean:  31.200 cm/s LVOT VTI:    0.067 m TRICUSPID VALVE TR Peak grad:   27.2 mmHg TR Vmax:        261.00 cm/s  SHUNTS Systemic VTI:  0.07 m Systemic Diam: 2.30 cm Kristeen Miss MD Electronically signed by Kristeen Miss MD Signature Date/Time: 09/25/2020/1:23:51 PM    Final      Discharge Instructions: Discharge Instructions    Call MD for:  extreme fatigue   Complete by: As directed    Call MD for:  persistant dizziness or light-headedness   Complete by: As directed    Call MD for:  persistant nausea and vomiting   Complete by: As directed    Call MD for:  temperature >100.4   Complete by: As directed     Diet - low sodium heart healthy   Complete  by: As directed    Discharge instructions   Complete by: As directed    Rodney Kane thank you for allowing [place] to care for you today.  It appears as though you had another stroke, secondary to the blood clot that is in your left ventricle of your heart.  It is of better importance that you continue to take your Xarelto.  If you ever feel like you cannot afford this medication please please call your primary care provider.  This will be absolutely important for you to stay on this medication to prevent further strokes.  Please follow-up with your primary care provider as well as your cardiologist and the neurologist.  I have put their information in your discharge packet.  Please call these offices and schedule appointments to be seen.  If you notice any other new signs of weakness or feel as though you are having a medical emergency, please call 911 or go to the closest emergency department.  Otherwise please continue to take all of your medications and follow-up with your primary care provider within a week   Increase activity slowly   Complete by: As directed       Signed: Riesa Pope, MD 09/27/2020, 3:29 PM   Pager: [phone]

## 2020-09-27 NOTE — Plan of Care (Signed)
  Problem: Education: Goal: Knowledge of General Education information will improve Description: Including pain rating scale, medication(s)/side effects and non-pharmacologic comfort measures Outcome: Adequate for Discharge   Problem: Health Behavior/Discharge Planning: Goal: Ability to manage health-related needs will improve Outcome: Adequate for Discharge   Problem: Clinical Measurements: Goal: Ability to maintain clinical measurements within normal limits will improve Outcome: Adequate for Discharge Goal: Will remain free from infection Outcome: Adequate for Discharge Goal: Diagnostic test results will improve Outcome: Adequate for Discharge Goal: Respiratory complications will improve Outcome: Adequate for Discharge Goal: Cardiovascular complication will be avoided Outcome: Adequate for Discharge   Problem: Activity: Goal: Risk for activity intolerance will decrease Outcome: Adequate for Discharge   Problem: Nutrition: Goal: Adequate nutrition will be maintained Outcome: Adequate for Discharge   Problem: Coping: Goal: Level of anxiety will decrease Outcome: Adequate for Discharge   Problem: Elimination: Goal: Will not experience complications related to bowel motility Outcome: Adequate for Discharge Goal: Will not experience complications related to urinary retention Outcome: Adequate for Discharge   Problem: Pain Managment: Goal: General experience of comfort will improve Outcome: Adequate for Discharge   Problem: Safety: Goal: Ability to remain free from injury will improve Outcome: Adequate for Discharge   Problem: Skin Integrity: Goal: Risk for impaired skin integrity will decrease Outcome: Adequate for Discharge   Problem: Education: Goal: Knowledge of disease or condition will improve Outcome: Adequate for Discharge Goal: Knowledge of secondary prevention will improve Outcome: Adequate for Discharge Goal: Knowledge of patient specific risk factors  addressed and post discharge goals established will improve Outcome: Adequate for Discharge Goal: Individualized Educational Video(s) Outcome: Adequate for Discharge   Problem: Coping: Goal: Will verbalize positive feelings about self Outcome: Adequate for Discharge Goal: Will identify appropriate support needs Outcome: Adequate for Discharge   Problem: Health Behavior/Discharge Planning: Goal: Ability to manage health-related needs will improve Outcome: Adequate for Discharge   Problem: Self-Care: Goal: Ability to participate in self-care as condition permits will improve Outcome: Adequate for Discharge Goal: Verbalization of feelings and concerns over difficulty with self-care will improve Outcome: Adequate for Discharge Goal: Ability to communicate needs accurately will improve Outcome: Adequate for Discharge   Problem: Nutrition: Goal: Risk of aspiration will decrease Outcome: Adequate for Discharge   Problem: Ischemic Stroke/TIA Tissue Perfusion: Goal: Complications of ischemic stroke/TIA will be minimized Outcome: Adequate for Discharge   

## 2020-09-27 NOTE — TOC Transition Note (Signed)
Transition of Care [facility]) - CM/SW Discharge Note   Patient Details  Name: Rodney Kane MRN: [id number] Date of Birth: 1975-08-13  Transition of Care [facility]) CM/SW Contact:  Pollie Friar, RN Phone Number: 09/27/2020, 4:40 PM   Clinical Narrative:    Patient discharging home with son. Pt has home meds at the bedside. CM called his [facility] and they have refills on his medications waiting. TOC pharmacy delivered new medications to the room. CM provided co pay card for Xarelto and explained its use to pt and son. Pt was suppose to start [place] at home but has a large co pay. He has not met his out of pocket deductible as of yet. Once this is met he will have a $0 copay. Pt and son aware. HH to re-run his insurance to see if the out of pocket has been met.  CM also entered him for outpatient therapy with pt, son and MD permission to see if the co pays would be less. The outpatient will call the patient for first appt and let him know the co pay cost. Son providing transport home.   Final next level of care: Home w Home Health Services Barriers to Discharge: No Barriers Identified   Patient Goals and CMS Choice     Choice offered to / list presented to : Patient  Discharge Placement                       Discharge Plan and Services                          HH Arranged: Speech Therapy [place] Agency: [place] Date [place]: 09/27/20   Representative spoke with at [place]: [place] ([place]) Interventions     Readmission Risk Interventions No flowsheet data found.

## 2020-09-27 NOTE — Assessment & Plan Note (Addendum)
I think he still volume up based on weights compared to hospital discharge.  Would like to finally get him on Entresto, but need to cost evaluation for that.  We will set this up for CVRR with BP follow-up.  Plan: Increase losartan to 50 mg daily Go back to taking 80 mg Lasix daily for 1 week.  Following that every other day take 80 mg and 40 mg.   Monitor daily weights.  Would like to have him follow-up with CVRR in order to potentially switch to [place] and then potentially add [place] or Jardiance.

## 2020-09-27 NOTE — Patient Outreach (Signed)
[facility]) Care Management  09/27/2020  Rodney Kane 07/01/1976 [id number]   Hospital liaisons notified that member has been admitted to hospital again for stroke.  Will follow up with member pending disposition.  [name], [place], MSN [facility] Care Management  [facility] Manager [phone]

## 2020-09-28 ENCOUNTER — Other Ambulatory Visit: Payer: Self-pay | Admitting: *Deleted

## 2020-09-28 ENCOUNTER — Telehealth: Payer: Self-pay | Admitting: Licensed Clinical Social Worker

## 2020-09-28 NOTE — Progress Notes (Signed)
Heart and Vascular Care Navigation  09/28/2020  Rodney Kane 02/22/1976 [id number]  Reason for Referral:   f/u on SNAP Application; f/u on paperwork questions                                                                                                  Assessment:  LCSW spoke with pt at [phone]; pt home from the hospital, states he has been doing okay since discharge. I shared that [facility] was in need of additional documentation for his SNAP application. I also shared that inpatient [facility] colleagues had let me know that pt in need of additional documentation from PCP office. I have spoken to [facility] and they can have any paperwork that needs to be completed faxed to them at [phone].   I attempted to provide pt with those numbers but unfortunately pt not able to understand and appropriately notate numbers at this time. Pt requested that I send those numbers to his daughters phone at [phone]. I have done so.                                   HRT/VAS Care Coordination    Patients Home Cardiology Office [facility]   Outpatient Care Team Social Worker   Social Worker Name: Esmeralda Links [place], [phone]   Living arrangements for the past 2 months Apartment   Lives with: Adult Children   Patient Current Insurance Scientist, water quality   Patient Has Concern With Paying Medical Bills No   Does Patient Have Prescription Coverage? Yes   Home Assistive Devices/Equipment None   DME Agency NA   [facility] Agency [employer] [facility]      Social History:                                                                             SDOH Screenings   Alcohol Screen: Not on file  Depression (PHQ2-9): Low Risk   . PHQ-2 Score: 0  Financial Resource Strain: Not on file  Food Insecurity: No Food Insecurity  . Worried About Programme researcher, broadcasting/film/video in the Last Year: Never true  . Ran Out of Food in the Last Year:  Never true  Housing: Low Risk   . Last Housing Risk Score: 0  Physical Activity: Inactive  . Days of Exercise per Week: 0 days  . Minutes of Exercise per Session: 0 min  Social Connections: Not on file  Stress: Stress Concern Present  . Feeling of Stress : To some extent  Tobacco Use: Low Risk   . Smoking Tobacco Use: Never Smoker  . Smokeless Tobacco Use: Never Used  Transportation Needs: No Transportation Needs  . Lack of Transportation (  Medical): No  . Lack of Transportation (Non-Medical): No    SDOH Interventions: Food Insecurity:  Food Insecurity Interventions: Assist with SNAP Application   Follow-up plan:   I will f/u next week to see if pt has been able to contact those resources/for any additional care needs.

## 2020-09-28 NOTE — Consult Note (Signed)
   [facility] CM Inpatient Consult   09/28/2020  Markies Mowatt 1975/11/05 [id number]  Late entry for 09/27/20 10:00 am  This writer was made aware that this patient was admitted and had been active for post hospital stroke follow up with a [facility] RN Care Management Coordinator.  Plan:  Following for any additional needs.  Charlesetta Shanks, RN BSN CCM [facility]  [phone] business mobile phone Toll free office [phone]  Fax number: [phone] [email or web address]

## 2020-09-28 NOTE — Patient Outreach (Signed)
[facility]) Care Management  09/28/2020  Rodney Kane [birth year] [id number]   Member discharged home yesterday.  Call placed to follow up on recent stroke, no answer, unable to leave voice message.  Will follow up within the next 3-4 business days.  [name], [place], MSN [facility] Care Management  [facility] Manager [phone]

## 2020-10-01 ENCOUNTER — Telehealth: Payer: Self-pay

## 2020-10-01 ENCOUNTER — Telehealth: Payer: Self-pay | Admitting: Licensed Clinical Social Worker

## 2020-10-01 ENCOUNTER — Telehealth: Payer: Self-pay | Admitting: Pharmacist

## 2020-10-01 DIAGNOSIS — Z9114 Patient's other noncompliance with medication regimen: Secondary | ICD-10-CM

## 2020-10-01 DIAGNOSIS — Z596 Low income: Secondary | ICD-10-CM

## 2020-10-01 NOTE — Telephone Encounter (Signed)
Pt called this Clinical research associate inquiring about paperwork; clarified that I had sent the appropriate contact numbers for SNAP application and FMLA/Short Term Disability paperwork to pt daughter [name] as requested on 3/11. Pt confirmed he will reach out to pt daughter to ensure she has received the numbers (she had confirmed this with this Clinical research associate). Pt to call back if they were not received/any additional questions.   Rodney Kane, MSW, LCSW [facility] Heart/Vascular Care Navigation  [phone]

## 2020-10-01 NOTE — Progress Notes (Addendum)
[facility])  [facility] Quality Pharmacy Team    10/01/2020  Rodney Kane 01-02-1976 [id number]  Reason for referral: Medication Review  Referral source: [facility] RN Current insurance: [facility]  PMHx includes but not limited to:  CVA with aphasia, HTN, HLD, type 2 diabetes and recent COVID-19 infection. Patient was recently hospitalized for another CVA secondary due to subtherapeutic INR (suspected warfarin non-adherence).   Outreach:  Successful telephone call with patient.  HIPAA identifiers verified.    No Known Allergies   Current Outpatient Medications:  .  albuterol (VENTOLIN HFA) 108 (90 Base) MCG/ACT inhaler, Inhale 2 puffs into the lungs every 6 (six) hours as needed for wheezing or shortness of breath., Disp: 8 g, Rfl: 11 .  atorvastatin (LIPITOR) 10 MG tablet, Take 1 tablet (10 mg total) by mouth daily., Disp: 90 tablet, Rfl: 3 .  carvedilol (COREG) 12.5 MG tablet, Take 1 tablet (12.5 mg total) by mouth 2 (two) times daily with a meal., Disp: 180 tablet, Rfl: 3 .  cetirizine (ZYRTEC ALLERGY) 10 MG tablet, Take 1 tablet (10 mg total) by mouth daily. (Patient not taking: Reported on 09/25/2020), Disp: 30 tablet, Rfl: 12 .  furosemide (LASIX) 40 MG tablet, Take 80 mg  ( 2 tablets of 40 mg) every other day with 40 mg (1 tablet) the opposite day, Disp: 180 tablet, Rfl: 2 .  glipiZIDE (GLUCOTROL) 10 MG tablet, Take 1 tablet (10 mg total) by mouth 2 (two) times daily before a meal., Disp: 180 tablet, Rfl: 3 .  hydrocortisone cream 1 %, Apply 1 application topically 2 (two) times daily., Disp: 30 g, Rfl: 6 .  hydrOXYzine (ATARAX/VISTARIL) 10 MG tablet, Take 1 tablet (10 mg total) by mouth 3 (three) times daily as needed. (Patient not taking: Reported on 09/25/2020), Disp: 90 tablet, Rfl: 11 .  losartan (COZAAR) 50 MG tablet, Take 1 tablet (50 mg total) by mouth 2 (two) times daily., Disp: 180 tablet, Rfl: 2 .  polyethylene glycol powder (GLYCOLAX/MIRALAX)  17 GM/SCOOP powder, Take 17 g by mouth daily., Disp: 3350 g, Rfl: 1 .  potassium chloride SA (KLOR-CON) 20 MEQ tablet, Take 1 tablet (20 mEq total) by mouth daily., Disp: 90 tablet, Rfl: 3 .  rivaroxaban (XARELTO) 20 MG TABS tablet, Take 1 tablet (20 mg total) by mouth daily with supper., Disp: 30 tablet, Rfl: 30 .  sitaGLIPtin (JANUVIA) 25 MG tablet, Take 1 tablet (25 mg total) by mouth daily., Disp: 90 tablet, Rfl: 3 .  spironolactone (ALDACTONE) 25 MG tablet, Take 0.5 tablets (12.5 mg total) by mouth daily., Disp: 30 tablet, Rfl: 3 .  Vitamin D, Ergocalciferol, (DRISDOL) 1.25 MG (50000 UNIT) CAPS capsule, Take 1 capsule (50,000 Units total) by mouth every 7 (seven) days. (Patient taking differently: Take 50,000 Units by mouth every 7 (seven) days. Mondays), Disp: 5 capsule, Rfl: 5 .  zinc sulfate 220 (50 Zn) MG capsule, Take 1 capsule (220 mg total) by mouth daily. (Patient not taking: No sig reported), Disp: , Rfl:    Assessment: Medication Review Findings:  . Levemir, Novolog, Warfarin, and Blood Glucose testing supplies were discontinued at discharge. Patient communicated understanding and said he had all of his prescribed medications.    Medication Adherence Findings: Adherence Review  []  Excellent (no doses missed/week)     []  Good (no more than 1 dose missed/week)     []  Partial (2-3 doses missed/week) [x]  Poor (>3 doses missed/week)  Patient with good understanding of regimen and  good understanding of indications.     Medication Assistance Findings:  Medication assistance needs identified: Xarelto--patient reported coupon was given Januvia-coupon sent to patient two weeks ago. He reported being able to get the Januvia with the coupon. He also reported getting insulin. All insulins were discontinued at discharge.  Patient communicted understanding and said he would place the insulin in the refrigerator just in case they were going to be restarted.   Patient reported the Xarelto  coupon was good for the first fill and subsequent refills.  Extra Help:  Not eligible for Extra Help Low Income Subsidy based on reported income and assets--He has Nurse, learning disability.  Plan: Follow up with patient 1-2 weeks.   Beecher Mcardle, PharmD, BCACP [facility] Clinical Pharmacist [phone]

## 2020-10-03 ENCOUNTER — Other Ambulatory Visit: Payer: Self-pay | Admitting: *Deleted

## 2020-10-03 NOTE — Patient Outreach (Signed)
[facility]) Care Management  10/03/2020  Kien Mirsky [birth year] [id number]   Outreach attempt #2, unsuccessful, unable to leave voice message.  Will send outreach letter and follow up within the next 3-4 business days.  [name], [place], MSN [facility] Care Management  [facility] Manager [phone]

## 2020-10-03 NOTE — Progress Notes (Deleted)
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  Past Medical History    Past Medical History:  Diagnosis Date  . Coronavirus infection 06/2020  . COVID 06/2020  . Diabetes mellitus without complication (HCC)   . Hemoglobin A1C greater than 9%, indicating poor diabetic control 12/2019  . Hyperglycemia 12/2019  . Hyperlipidemia 12/2019  . Hypertension   . Hypertensive urgency 12/2019  . Noncompliance with medication regimen 12/2019  . Nonischemic (hypertensive) dilated cardiomyopathy (HCC) 12/2016   EF has been 30 to 35%, recently lower.  . Stroke (HCC)   . Urine ketones 12/2019  . Vitamin D deficiency 12/2019   Past Surgical History:  Procedure Laterality Date  . left knee surgery    . RIGHT/LEFT HEART CATH AND CORONARY ANGIOGRAPHY N/A 01/05/2017   Procedure: Right/Left Heart Cath and Coronary Angiography;  Surgeon: [name], MD;  Location: [facility] INVASIVE CV LAB;  Service: Cardiovascular; angiograph normal coronaries.  PAP mean 19 million mercury, PCWP 15 mmHg.  LVEDP 9 mmHg.  Cardiac output/index 7.84/2.7.  RAP mean 4 mmHg.  Marland Kitchen TRANSTHORACIC ECHOCARDIOGRAM  01/04/2017   EF 30 to 35%.  Moderate LVH.  Moderate severely reduced EF.  Diffuse HK.  Normal valves.  . TRANSTHORACIC ECHOCARDIOGRAM  07/20/2020    Severely reduced LV function-EF 20 to 25%, global HK.  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  Mildly reduced cardiac function.  Unable to assess PA pressures.  Moderate LA dilation.  Small pericardial effusion.  Relatively normal mitral valve and tricuspid valve.  Normal RA pressures.    Allergies  No Known Allergies  History of  Present Illness    Rodney Kane has a PMH of diabetes, HTN, nonischemic cardiomyopathy-diagnosed 2018 in the setting of uncontrolled hypertension, preceded by URI symptoms, normal coronary arteries by cardiac catheterization, he was lost to cardiology follow-up and was not seen again until 12/21.  He was admitted for COVID-19.  He was noted to have CVA, LV thrombus, and had further decreased LVEF to 20-25%.  This was in the setting of noncompliance with his medications.  Medical management was recommended.  He was also noted to have some outpatient noncompliance issues.  He left AMA from his primary care office with severely elevated blood pressure.  He was readmitted 09/25/2020 with recurrent CVA in the context of subtherapeutic INR.  Cardiology was consulted for LV thrombus and continued severe LV dysfunction.  His echocardiogram 09/25/2020 showed LVEF less than 20%, G3 DD.  He was discharged on 09/27/2020 on Xarelto, losartan, carvedilol, and spironolactone.  He presents the clinic today for follow-up evaluation states***  *** denies chest pain, shortness of breath, lower extremity edema, fatigue, palpitations, melena, hematuria, hemoptysis, diaphoresis, weakness, presyncope, syncope, orthopnea, and PND.       Home Medications    Prior to Admission medications   Medication Sig Start Date End Date Taking? Authorizing Provider  albuterol (VENTOLIN HFA) 108 (90 Base) MCG/ACT inhaler Inhale 2 puffs into the lungs every 6 (six) hours as needed for wheezing or shortness of breath. 09/19/20   Kallie Locks, FNP  atorvastatin (LIPITOR) 10 MG tablet Take 1 tablet (10 mg total) by mouth daily. 01/05/20   Bradly Chris,  Rolm Gala, FNP  carvedilol (COREG) 12.5 MG tablet Take 1 tablet (12.5 mg total) by mouth 2 (two) times daily with a meal. 08/21/20   Kallie Locks, FNP  cetirizine (ZYRTEC ALLERGY) 10 MG tablet Take 1 tablet (10 mg total) by mouth daily. Patient not taking: Reported on 09/25/2020 09/19/20   Kallie Locks, FNP  furosemide (LASIX) 40 MG tablet Take 80 mg  ( 2 tablets of 40 mg) every other day with 40 mg (1 tablet) the opposite day 09/14/20   [name], MD  glipiZIDE (GLUCOTROL) 10 MG tablet Take 1 tablet (10 mg total) by mouth 2 (two) times daily before a meal. 09/17/20   Kallie Locks, FNP  hydrocortisone cream 1 % Apply 1 application topically 2 (two) times daily. 09/19/20   Kallie Locks, FNP  hydrOXYzine (ATARAX/VISTARIL) 10 MG tablet Take 1 tablet (10 mg total) by mouth 3 (three) times daily as needed. Patient not taking: Reported on 09/25/2020 09/19/20   Kallie Locks, FNP  losartan (COZAAR) 50 MG tablet Take 1 tablet (50 mg total) by mouth 2 (two) times daily. 09/14/20 12/13/20  [name], MD  polyethylene glycol powder [facility]) 17 GM/SCOOP powder Take 17 g by mouth daily. 08/29/20   Kallie Locks, FNP  potassium chloride SA (KLOR-CON) 20 MEQ tablet Take 1 tablet (20 mEq total) by mouth daily. 09/05/20   [name], PA-C  rivaroxaban (XARELTO) 20 MG TABS tablet Take 1 tablet (20 mg total) by mouth daily with supper. 09/26/20   Inez Catalina, MD  sitaGLIPtin (JANUVIA) 25 MG tablet Take 1 tablet (25 mg total) by mouth daily. 09/17/20   Kallie Locks, FNP  spironolactone (ALDACTONE) 25 MG tablet Take 0.5 tablets (12.5 mg total) by mouth daily. 09/27/20 12/26/20  Belva Agee, MD  Vitamin D, Ergocalciferol, (DRISDOL) 1.25 MG (50000 UNIT) CAPS capsule Take 1 capsule (50,000 Units total) by mouth every 7 (seven) days. Patient taking differently: Take 50,000 Units by mouth every 7 (seven) days. Mondays 01/05/20   Kallie Locks, FNP  zinc sulfate 220 (50 Zn) MG capsule Take 1 capsule (220 mg total) by mouth daily. Patient not taking: No sig reported 07/28/20   Standley Brooking, MD    Family History    Family History  Problem Relation Age of Onset  . Multiple sclerosis Mother   . Diabetic kidney disease Mother   . Hypertension Mother    He  indicated that his mother is alive. He indicated that his father is alive. He indicated that his maternal grandmother is deceased. He indicated that his maternal grandfather is deceased. He indicated that his paternal grandmother is deceased. He indicated that his paternal grandfather is deceased.  Social History    Social History   Socioeconomic History  . Marital status: Divorced    Spouse name: Not on file  . Number of children: 3  . Years of education: Not on file  . 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 Smoking status: Never Smoker  . Smokeless tobacco: Never Used  Vaping Use  . Vaping Use: Never used  Substance and Sexual Activity  . Alcohol use: No  . Drug use: No  . Sexual activity: Yes  Other Topics Concern  . Not on file  Social History Narrative  . Not on file   Social Determinants of Health   Financial Resource  Strain: Not on file  Food Insecurity: No Food Insecurity  . Worried About Programme researcher, broadcasting/film/video in the Last Year: Never true  . Ran Out of Food in the Last Year: Never true  Transportation Needs: No Transportation Needs  . Lack of Transportation (Medical): No  . Lack of Transportation (Non-Medical): No  Physical Activity: Inactive  . Days of Exercise per Week: 0 days  . Minutes of Exercise per Session: 0 min  Stress: Stress Concern Present  . Feeling of Stress : To some extent  Social Connections: Not on file  Intimate Partner Violence: Not on file     Review of Systems    General:  No chills, fever, night sweats or weight changes.  Cardiovascular:  No chest pain, dyspnea on exertion, edema, orthopnea, palpitations, paroxysmal nocturnal dyspnea. Dermatological: No rash, lesions/masses Respiratory: No cough, dyspnea Urologic: No hematuria, dysuria Abdominal:   No nausea, vomiting, diarrhea, bright red blood per rectum, melena, or hematemesis Neurologic:  No visual changes,  wkns, changes in mental status. All other systems reviewed and are otherwise negative except as noted above.  Physical Exam    VS:  There were no vitals taken for this visit. , BMI There is no height or weight on file to calculate BMI. GEN: Well nourished, well developed, in no acute distress. HEENT: normal. Neck: Supple, no JVD, carotid bruits, or masses. Cardiac: RRR, no murmurs, rubs, or gallops. No clubbing, cyanosis, edema.  Radials/DP/PT 2+ and equal bilaterally.  Respiratory:  Respirations regular and unlabored, clear to auscultation bilaterally. GI: Soft, nontender, nondistended, BS + x 4. MS: no deformity or atrophy. Skin: warm and dry, no rash. Neuro:  Strength and sensation are intact. Psych: Normal affect.  Accessory Clinical Findings    Recent Labs: 07/19/2020: TSH 1.447 07/22/2020: Magnesium 2.0 09/26/2020: B Natriuretic Peptide 1,497.8 09/27/2020: ALT 41; BUN 18; Creatinine, Ser 1.29; Hemoglobin 13.8; Platelets 221; Potassium 4.1; Sodium 136   Recent Lipid Panel    Component Value Date/Time   CHOL 87 09/26/2020 0327   TRIG 40 09/26/2020 0327   HDL 29 (L) 09/26/2020 0327   CHOLHDL 3.0 09/26/2020 0327   VLDL 8 09/26/2020 0327   LDLCALC 50 09/26/2020 0327    ECG personally reviewed by me today- *** - No acute changes  Echocardiogram 09/25/2020 IMPRESSIONS    1. 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 The apical thrombus  was confirmed with Definity contrast.   . Left ventricular ejection fraction, by estimation, is <20%. The left  ventricle has severely decreased function. The left ventricle demonstrates  regional wall motion abnormalities (see scoring diagram/findings for  description). Left ventricular  diastolic parameters are consistent with Grade III diastolic dysfunction  (restrictive). There is severe akinesis of the left ventricular,  mid-apical lateral wall, anteroseptal wall and apical segment.  2. The mitral valve is normal in  structure. Trivial mitral valve  regurgitation. No evidence of mitral stenosis.  3. The aortic valve is normal in structure. Aortic valve regurgitation is  not visualized. No aortic stenosis is present.  4. There is mildly elevated pulmonary artery systolic pressure.  Assessment & Plan   1.  LV thrombus-reports compliance with Xarelto.  Denies bleeding issues.  Denies shortness of breath. Continue Xarelto Heart healthy low-sodium diet-salty 6 given Increase physical activity as tolerated  Combined systolic and diastolic/NICM CHF-no increased activity intolerance or DOE.  Remains physically active. Continue carvedilol, furosemide, losartan, potassium, Januvia, spironolactone Heart healthy low-sodium diet-salty 6 given  Increase physical activity as tolerated Increase losartan BMP in 1 week  Hyperlipidemia-09/26/2020: Cholesterol 87; HDL 29; LDL Cholesterol 50; Triglycerides 40; VLDL 8 Continue atorvastatin Heart healthy low-sodium high-fiber diet Increase physical activity as tolerated   Essential hypertension-BP today***.  Reports well-controlled at home. Continue carvedilol, furosemide, losartan, spironolactone Heart healthy low-sodium diet-salty 6 given Increase physical activity as tolerated  Diabetes mellitus-A1c 7.9 on 09/25/2020. Continue Januvia Heart healthy low-sodium carb modified diet Follows with PCP  Disposition: Follow-up with Dr. Herbie Baltimore 3 months.  Thomasene Ripple. Cleaver NP-C    10/03/2020, 6:40 AM [facility] [address] Office [phone] Fax [phone]  Notice: This dictation was prepared with Dragon dictation along with smaller phrase technology. Any transcriptional errors that result from this process are unintentional and may not be corrected upon review.  I spent***minutes examining this patient, reviewing medications, and using patient centered shared decision making involving her cardiac care.  Prior to her  visit I spent greater than 20 minutes reviewing her past medical history,  medications, and prior cardiac tests.

## 2020-10-04 ENCOUNTER — Ambulatory Visit: Payer: BC Managed Care – PPO | Admitting: General Practice

## 2020-10-04 NOTE — Telephone Encounter (Signed)
error 

## 2020-10-09 ENCOUNTER — Other Ambulatory Visit: Payer: Self-pay | Admitting: *Deleted

## 2020-10-09 ENCOUNTER — Ambulatory Visit: Payer: BC Managed Care – PPO | Admitting: *Deleted

## 2020-10-09 NOTE — Patient Outreach (Signed)
[facility]) Care Management  10/09/2020  Rodney Kane 01/23/1976 [id number]   Outreach attempt #3, successful.    He report he has been doing well, does not have any physical limitations from most recent stroke.  However still having expressive aphasia and stuttering.  Per chart, recommended to have home health with [facility] or outpatient therapy, whichever is cheaper.  Denies being contacted by either of them yet.    State he has still been checking his blood pressure daily, yesterday was 121/98.  He is not checking blood sugar, did not pick up glucose meter from pharmacy.  Encouraged to do so and monitor as he is currently not taking any insulin.  He verbalizes understanding.  Has follow up appointments this week with neurology, PCP, and cardiology.  Report he is able to attend all appointments independently.    Denies any urgent concerns, encouraged to contact this care manager with questions.  Agrees to follow up within the next 2 weeks.  Goals Addressed            This Visit's Progress   . [facility] - Find Help in My Community   On track    Timeframe:  Short-Term Goal Priority:  High Start Date:       3/2                 Expected End Date:       4/2  Follow Up Date 3/22   - follow-up on any referrals for help I am given - make a list of family or friends that I can call    Why is this important?    Knowing how and where to find help for yourself or family in your neighborhood and community is an important skill.   You will want to take some steps to learn how.    Notes:   1/28 - Confirmed member has been in contact with [facility] pharmacist  1/20 - Referral placed to Remote Health - not eligible due to age  79/17 - Call placed to PCP office to request referral to outpatient speech therapy  3/2 - accompanied member to PCP office, referral received for ST    . [facility] - Make and Keep All Appointments   On track    Timeframe:  Short-Term Goal Priority:   Medium Start Date:          3/2           Expected End Date:       4/2                Follow Up Date 3/22   - ask family or friend for a ride - keep a calendar with appointment dates    Why is this important?    Part of staying healthy is seeing the doctor for follow-up care.   If you forget your appointments, there are some things you can do to stay on track.    Notes:   1/28 - Call placed to reschedule cardiology visit  2/17 - Upcoming appointments reviewed with member  3/2 - Reviewed upcoming appointments, assessed need for transportation resources    . [facility] - Track and Manage My Blood Pressure-Hypertension   On track    Timeframe:  Long-Range Goal Priority:  Medium Start Date:         08/09/2020                    Expected End Date:  10/07/2020                     Follow Up Date 4/4   - check blood pressure daily - write blood pressure results in a log or diary    Why is this important?    You won't feel high blood pressure, but it can still hurt your blood vessels.   High blood pressure can cause heart or kidney problems. It can also cause a stroke.   Making lifestyle changes like losing a little weight or eating less salt will help.   Checking your blood pressure at home and at different times of the day can help to control blood pressure.   If the doctor prescribes medicine remember to take it the way the doctor ordered.   Call the office if you cannot afford the medicine or if there are questions about it.     Notes:   1/28 - Reminded to monitor blood pressure and record daily  2/17 - Confirmed member has BP monitor, will send scale for daily weights  3/2 - Reviewed medication changes, reminded to monitor and record      [name], Charity fundraiser, MSN [facility] Care Management  [place] [facility] Manager [phone]

## 2020-10-10 ENCOUNTER — Ambulatory Visit: Payer: BC Managed Care – PPO | Admitting: Neurology

## 2020-10-10 ENCOUNTER — Encounter: Payer: Self-pay | Admitting: Neurology

## 2020-10-10 ENCOUNTER — Encounter: Payer: Self-pay | Admitting: Speech Pathology

## 2020-10-10 ENCOUNTER — Ambulatory Visit: Payer: BC Managed Care – PPO | Attending: Internal Medicine | Admitting: Speech Pathology

## 2020-10-10 ENCOUNTER — Other Ambulatory Visit: Payer: Self-pay

## 2020-10-10 VITALS — BP 102/79 | HR 105 | Ht 73.0 in | Wt 266.8 lb

## 2020-10-10 DIAGNOSIS — I6340 Cerebral infarction due to embolism of unspecified cerebral artery: Secondary | ICD-10-CM | POA: Diagnosis not present

## 2020-10-10 DIAGNOSIS — R482 Apraxia: Secondary | ICD-10-CM | POA: Diagnosis not present

## 2020-10-10 DIAGNOSIS — R4701 Aphasia: Secondary | ICD-10-CM | POA: Diagnosis not present

## 2020-10-10 DIAGNOSIS — I513 Intracardiac thrombosis, not elsewhere classified: Secondary | ICD-10-CM

## 2020-10-10 DIAGNOSIS — Z9189 Other specified personal risk factors, not elsewhere classified: Secondary | ICD-10-CM

## 2020-10-10 NOTE — Progress Notes (Signed)
[facility] [address] [name]. 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  Initially presented to [facility] emergency room on 07/19/2020 for evaluation for confusion and altered mental status for several days.  He also had shortness of breath and chest pain and initially negative Covid test and negative chest CT for pulmonary embolism.  He was found to be aphasic and CT scan of the head was obtained which showed left MCA subacute infarct.  He is however subsequently tested Covid positive during hospitalization.  BNP was elevated at 1600.  2D echo showed diminished ejection fraction of 20% with left ventricular large mural thrombus.  CT angiogram showed no large vessel extracranial intracranial stenosis.  LDL cholesterol 102 mg percent and hemoglobin A1c 7.7.  Patient was started on IV heparin initially and subsequently switched to warfarin with target INR goal of between 2 and 2.5.  His D-dimer is elevated 1.27 and fibrinogen level was 608.  He was treated with remdesivir and steroids.  Patient subsequently made good recovery from his COVID as well as with aphasia but had mild residual expressive aphasia.  He was able to handle his own affairs.  He returned back to the ER on 09/25/2020 with difficulty using his right arm.  He did he describe numbness as well as weakness which happened in the morning  when he woke up from sleep.  He was supposed to be on warfarin but admitted to not being very compliant and INR on admission was low at 1.3.  MRI scan of the brain was obtained which showed recent subacute left MCA branch hemorrhagic infarct as well as tiny punctate infarct in the left cerebellum which was acute.  Repeat echocardiogram showed persistent large apical mural thrombus in the left ventricle with ejection fraction less than 20%.  LDL cholesterol this time was satisfactory at 50 and A1c was yet elevated at 7.9.  After discussion with cardiology and pharmacy it was decided to switch the patient from warfarin to Xarelto because of compliance issues of monitoring INR and patient living alone making it difficult for him to go for blood checks and appointments.  Patient states is done well since discharge.  He had no further recurrent TIA or stroke symptoms and right upper extremity numbness and balance has fully resolved.  He continues to expressive language and speech difficulties with nonfluent speech and word hesitancy.  He is living now with his son.  Is still responsible for his own medications and is pretty much independent in most activities of daily living.  He states his sugars are doing better now he does not check it every day.  He is tolerating Lipitor well without muscle aches and pains.  His blood pressure is good and today it is 102/79.  Patient has not been tested for sleep apnea before.  ROS:   14 system review of systems is positive for speech hesitancy, word finding difficulty, weakness, numbness all other systems negative  PMH:  Past Medical History:  Diagnosis Date  . Coronavirus infection 06/2020  . COVID 06/2020  . Diabetes mellitus without complication (HCC)   . Hemoglobin A1C greater than 9%, indicating poor diabetic control 12/2019  . Hyperglycemia 12/2019  . Hyperlipidemia 12/2019  . Hypertension   . Hypertensive urgency 12/2019  . Noncompliance with medication  regimen 12/2019  . Nonischemic (hypertensive) dilated cardiomyopathy (HCC) 12/2016   EF has been 30 to 35%, recently lower.  . Stroke (HCC)   . Urine ketones 12/2019  . Vitamin D deficiency 12/2019    Social History:  Social History   Socioeconomic History  . Marital status: Divorced    Spouse name: Not on file  . Number of children: 3  . Years of education: Not on file  . 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 Smoking status: Never Smoker  . Smokeless tobacco: Never Used  Vaping Use  . Vaping Use: Never used  Substance and Sexual Activity  . Alcohol use: No  . Drug use: No  . Sexual activity: Yes  Other Topics Concern  . Not on file  Social History Narrative  . Not on file   Social Determinants of Health   Financial Resource Strain: Not on file  Food Insecurity: No Food Insecurity  . Worried About Programme researcher, broadcasting/film/video in the Last Year: Never true  . Ran Out of Food in the Last Year: Never true  Transportation Needs: No Transportation Needs  . Lack of Transportation (Medical): No  . Lack of Transportation (Non-Medical): No  Physical Activity: Inactive  . Days of Exercise per Week: 0 days  . Minutes of Exercise per Session: 0 min  Stress: Stress Concern Present  . Feeling of Stress : To some extent  Social Connections: Not on file  Intimate Partner Violence: Not on file    Medications:   Current Outpatient Medications on File Prior to Visit  Medication Sig Dispense Refill  . albuterol (VENTOLIN HFA) 108 (90 Base) MCG/ACT inhaler Inhale 2 puffs into the lungs every 6 (six) hours as needed for wheezing or shortness of breath. 8 g 11  . atorvastatin (LIPITOR) 10 MG tablet Take 1 tablet (10 mg total) by mouth daily. 90 tablet 3  . carvedilol (COREG) 12.5 MG tablet Take 1 tablet (12.5 mg total) by mouth 2 (two) times daily with a meal. 180 tablet 3  . cetirizine (ZYRTEC ALLERGY) 10 MG tablet Take  1 tablet (10 mg total) by mouth daily. 30 tablet 12  . furosemide (LASIX) 40 MG tablet Take 80 mg  ( 2 tablets of 40 mg) every other day with 40 mg (1 tablet) the opposite day 180 tablet 2  . glipiZIDE (GLUCOTROL) 10 MG tablet Take 1 tablet (10 mg total) by mouth 2 (two) times daily before a meal. 180 tablet 3  . hydrocortisone cream 1 % Apply 1 application topically 2 (two) times daily. 30 g 6  . hydrOXYzine (ATARAX/VISTARIL) 10 MG tablet Take 1 tablet (10 mg total) by mouth 3 (three) times daily as needed. 90 tablet 11  . losartan (COZAAR) 50 MG tablet Take 1 tablet (50 mg total) by mouth 2 (two) times daily. 180 tablet 2  . polyethylene glycol powder (GLYCOLAX/MIRALAX) 17 GM/SCOOP powder Take 17 g by mouth daily. 3350 g 1  . potassium chloride SA (KLOR-CON) 20 MEQ tablet Take 1 tablet (20 mEq total) by mouth daily. 90  tablet 3  . rivaroxaban (XARELTO) 20 MG TABS tablet Take 1 tablet (20 mg total) by mouth daily with supper. 30 tablet 30  . sitaGLIPtin (JANUVIA) 25 MG tablet Take 1 tablet (25 mg total) by mouth daily. 90 tablet 3  . spironolactone (ALDACTONE) 25 MG tablet Take 0.5 tablets (12.5 mg total) by mouth daily. 30 tablet 3  . Vitamin D, Ergocalciferol, (DRISDOL) 1.25 MG (50000 UNIT) CAPS capsule Take 1 capsule (50,000 Units total) by mouth every 7 (seven) days. (Patient taking differently: Take 50,000 Units by mouth every 7 (seven) days. Mondays) 5 capsule 5  . zinc sulfate 220 (50 Zn) MG capsule Take 1 capsule (220 mg total) by mouth daily.     No current facility-administered medications on file prior to visit.    Allergies:  No Known Allergies  Physical Exam General: Obese middle-aged African-American male, seated, in no evident distress Head: head normocephalic and atraumatic.   Neck: supple with no carotid or supraclavicular bruits Cardiovascular: regular rate and rhythm, no murmurs Musculoskeletal: no deformity Skin:  no rash/petichiae .prominent keloid on the right  cheek Vascular:  Normal pulses all extremities  Neurologic Exam Mental Status: Awake and fully alert. Oriented to place and time. Recent and remote memory intact. Attention span, concentration and fund of knowledge appropriate. Mood and affect appropriate.  Mild expressive aphasia with nonfluent speech and word finding difficulties and hesitancy.  No paraphasic errors.  Good comprehension.  Able to name and repeat.  Mild dysarthria intermittently. Cranial Nerves: Fundoscopic exam reveals sharp disc margins. Pupils equal, briskly reactive to light. Extraocular movements full without nystagmus. Visual fields full to confrontation. Hearing intact. Facial sensation intact.  Right lower facial asymmetry., tongue, palate moves normally and symmetrically.  Motor: Normal bulk and tone. Normal strength in all tested extremity muscles.  Diminished fine finger movements on the right and orbits left over right upper extremity. Sensory.: intact to touch , pinprick , position and vibratory sensation.  Coordination: Rapid alternating movements normal in all extremities. Finger-to-nose and heel-to-shin performed accurately bilaterally. Gait and Station: Arises from chair without difficulty. Stance is normal. Gait demonstrates normal stride length and balance . Able to heel, toe and tandem walk with mild difficulty.  Reflexes: 1+ and symmetric. Toes downgoing.   NIHSS  3 Modified Rankin  2   ASSESSMENT: 45 year old African-American male with embolic left MCA branch infarct in December 2021 in the setting of acute Covid infection as well as small left cerebellar embolic infarct in March 2022 secondary to left ventricular mural thrombus and noncompliance with warfarin.  Vascular risk factors of left ventricular clot, cardiomyopathy, obesity, hypertension, hyperlipidemia, diabetes and at risk for sleep apnea     PLAN:  I had a long d/w patient about his recent strokes,left ventricular clot, risk for recurrent  stroke/TIAs, personally independently reviewed imaging studies and stroke evaluation results and answered questions.Continue Xarelto (rivaroxaban) daily  for secondary stroke prevention and maintain strict control of hypertension with blood pressure goal below 130/90, diabetes with hemoglobin A1c goal below 6.5% and lipids with LDL cholesterol goal below 70 mg/dL. I also advised the patient to eat a healthy diet with plenty of whole grains, cereals, fruits and vegetables, exercise regularly and maintain ideal body weight.  Check polysomnogram for sleep apnea.  Followup in the future with my nurse practitioner Shanda Bumps in 3 months or call earlier if necessary.  Greater than 50% time during this 45-minute consultation visit was spent on counseling and coordination of care about his embolic  strokes and left ventricular clot and answering questions Delia Heady, MD Note: This document was prepared with digital dictation and possible smart phrase technology. Any transcriptional errors that result from this process are unintentional.

## 2020-10-10 NOTE — Patient Instructions (Signed)
I had a long d/w patient about his recent strokes,left ventricular clot, risk for recurrent stroke/TIAs, personally independently reviewed imaging studies and stroke evaluation results and answered questions.Continue Xarelto (rivaroxaban) daily  for secondary stroke prevention and maintain strict control of hypertension with blood pressure goal below 130/90, diabetes with hemoglobin A1c goal below 6.5% and lipids with LDL cholesterol goal below 70 mg/dL. I also advised the patient to eat a healthy diet with plenty of whole grains, cereals, fruits and vegetables, exercise regularly and maintain ideal body weight.  Check polysomnogram for sleep apnea.  Followup in the future with my nurse practitioner Shanda Bumps in 3 months or call earlier if necessary.  Stroke Prevention Some medical conditions and behaviors are associated with a higher chance of having a stroke. You can help prevent a stroke by making nutrition, lifestyle, and other changes, including managing any medical conditions you may have. What nutrition changes can be made?  Eat healthy foods. You can do this by: ? Choosing foods high in fiber, such as fresh fruits and vegetables and whole grains. ? Eating at least 5 or more servings of fruits and vegetables a day. Try to fill half of your plate at each meal with fruits and vegetables. ? Choosing lean protein foods, such as lean cuts of meat, poultry without skin, fish, tofu, beans, and nuts. ? Eating low-fat dairy products. ? Avoiding foods that are high in salt (sodium). This can help lower blood pressure. ? Avoiding foods that have saturated fat, trans fat, and cholesterol. This can help prevent high cholesterol. ? Avoiding processed and premade foods.  Follow your health care provider's specific guidelines for losing weight, controlling high blood pressure (hypertension), lowering high cholesterol, and managing diabetes. These may include: ? Reducing your daily calorie intake. ? Limiting  your daily sodium intake to 1,500 milligrams (mg). ? Using only healthy fats for cooking, such as olive oil, canola oil, or sunflower oil. ? Counting your daily carbohydrate intake.   What lifestyle changes can be made?  Maintain a healthy weight. Talk to your health care provider about your ideal weight.  Get at least 30 minutes of moderate physical activity at least 5 days a week. Moderate activity includes brisk walking, biking, and swimming.  Do not use any products that contain nicotine or tobacco, such as cigarettes and e-cigarettes. If you need help quitting, ask your health care provider. It may also be helpful to avoid exposure to secondhand smoke.  Limit alcohol intake to no more than 1 drink a day for nonpregnant women and 2 drinks a day for men. One drink equals 12 oz of beer, 5 oz of wine, or 1 oz of hard liquor.  Stop any illegal drug use.  Avoid taking birth control pills. Talk to your health care provider about the risks of taking birth control pills if: ? You are over 28 years old. ? You smoke. ? You get migraines. ? You have ever had a blood clot. What other changes can be made?  Manage your cholesterol levels. ? Eating a healthy diet is important for preventing high cholesterol. If cholesterol cannot be managed through diet alone, you may also need to take medicines. ? Take any prescribed medicines to control your cholesterol as told by your health care provider.  Manage your diabetes. ? Eating a healthy diet and exercising regularly are important parts of managing your blood sugar. If your blood sugar cannot be managed through diet and exercise, you may need to take medicines. ?  Take any prescribed medicines to control your diabetes as told by your health care provider.  Control your hypertension. ? To reduce your risk of stroke, try to keep your blood pressure below 130/80. ? Eating a healthy diet and exercising regularly are an important part of controlling your  blood pressure. If your blood pressure cannot be managed through diet and exercise, you may need to take medicines. ? Take any prescribed medicines to control hypertension as told by your health care provider. ? Ask your health care provider if you should monitor your blood pressure at home. ? Have your blood pressure checked every year, even if your blood pressure is normal. Blood pressure increases with age and some medical conditions.  Get evaluated for sleep disorders (sleep apnea). Talk to your health care provider about getting a sleep evaluation if you snore a lot or have excessive sleepiness.  Take over-the-counter and prescription medicines only as told by your health care provider. Aspirin or blood thinners (antiplatelets or anticoagulants) may be recommended to reduce your risk of forming blood clots that can lead to stroke.  Make sure that any other medical conditions you have, such as atrial fibrillation or atherosclerosis, are managed. What are the warning signs of a stroke? The warning signs of a stroke can be easily remembered as BEFAST.  B is for balance. Signs include: ? Dizziness. ? Loss of balance or coordination. ? Sudden trouble walking.  E is for eyes. Signs include: ? A sudden change in vision. ? Trouble seeing.  F is for face. Signs include: ? Sudden weakness or numbness of the face. ? The face or eyelid drooping to one side.  A is for arms. Signs include: ? Sudden weakness or numbness of the arm, usually on one side of the body.  S is for speech. Signs include: ? Trouble speaking (aphasia). ? Trouble understanding.  T is for time. ? These symptoms may represent a serious problem that is an emergency. Do not wait to see if the symptoms will go away. Get medical help right away. Call your local emergency services (911 in the [place] Do not drive yourself to the hospital.  Other signs of stroke may include: ? A sudden, severe headache with no known  cause. ? Nausea or vomiting. ? Seizure. Where to find more information For more information, visit:  American Stroke Association: www.strokeassociation.org  National Stroke Association: www.stroke.org Summary  You can prevent a stroke by eating healthy, exercising, not smoking, limiting alcohol intake, and managing any medical conditions you may have.  Do not use any products that contain nicotine or tobacco, such as cigarettes and e-cigarettes. If you need help quitting, ask your health care provider. It may also be helpful to avoid exposure to secondhand smoke.  Remember BEFAST for warning signs of stroke. Get help right away if you or a loved one has any of these signs. This information is not intended to replace advice given to you by your health care provider. Make sure you discuss any questions you have with your health care provider. Document Revised: 06/19/2017 Document Reviewed: 08/12/2016 Elsevier Patient Education  2021 [employer].

## 2020-10-10 NOTE — Therapy (Signed)
[facility] [address] [place], [place], [address] Phone: [phone]   Fax:  [phone]  Speech Language Pathology Treatment  Patient Details  Name: Rodney Kane MRN: [id number] Date of Birth: 1975/09/28 Referring Provider (SLP): Dr. Delia Heady   Encounter Date: 10/10/2020   End of Session - 10/10/20 1534    Visit Number 1    Number of Visits 17    Date for SLP Re-Evaluation 12/05/20    SLP Start Time 1315    SLP Stop Time  1358    SLP Time Calculation (min) 43 min    Activity Tolerance Patient tolerated treatment well           Past Medical History:  Diagnosis Date  . Coronavirus infection 06/2020  . COVID 06/2020  . Diabetes mellitus without complication (HCC)   . Hemoglobin A1C greater than 9%, indicating poor diabetic control 12/2019  . Hyperglycemia 12/2019  . Hyperlipidemia 12/2019  . Hypertension   . Hypertensive urgency 12/2019  . Noncompliance with medication regimen 12/2019  . Nonischemic (hypertensive) dilated cardiomyopathy (HCC) 12/2016   EF has been 30 to 35%, recently lower.  . Stroke (HCC)   . Urine ketones 12/2019  . Vitamin D deficiency 12/2019    Past Surgical History:  Procedure Laterality Date  . left knee surgery    . RIGHT/LEFT HEART CATH AND CORONARY ANGIOGRAPHY N/A 01/05/2017   Procedure: Right/Left Heart Cath and Coronary Angiography;  Surgeon: [name], MD;  Location: [facility] INVASIVE CV LAB;  Service: Cardiovascular; angiograph normal coronaries.  PAP mean 19 million mercury, PCWP 15 mmHg.  LVEDP 9 mmHg.  Cardiac output/index 7.84/2.7.  RAP mean 4 mmHg.  Marland Kitchen TRANSTHORACIC ECHOCARDIOGRAM  01/04/2017   EF 30 to 35%.  Moderate LVH.  Moderate severely reduced EF.  Diffuse HK.  Normal valves.  . TRANSTHORACIC ECHOCARDIOGRAM  07/20/2020    Severely reduced LV function-EF 20 to 25%, global HK.  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   Mildly reduced cardiac function.  Unable to assess PA pressures.  Moderate LA dilation.  Small pericardial effusion.  Relatively normal mitral valve and tricuspid valve.  Normal RA pressures.    There were no vitals filed for this visit.   Subjective Assessment - 10/10/20 1322    Subjective "I I I ha ha had a stroke"    Currently in Pain? No/denies             SLP Evaluation [facility] - 10/10/20 1322      SLP Visit Information   SLP Received On 10/10/20    Referring Provider (SLP) Dr. 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  Initially presented to [facility] emergency room on 07/19/2020 for evaluation for confusion and altered mental status for several days.  He also had shortness of breath and chest pain and initially negative Covid test and negative chest CT for pulmonary embolism.  He was  found to be aphasic and CT scan of the head was obtained which showed left MCA subacute infarct.  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  Initially presented to [facility] emergency room on  07/19/2020 for evaluation for confusion and altered mental status for several days.  He also had shortness of breath and chest pain and initially negative Covid test and negative chest CT for pulmonary embolism.  He was found to be aphasic and CT scan of the head was obtained which showed left MCA subacute infarct.    Mobility Status walks independently      Balance Screen   Has the patient fallen in the past 6 months No    Has the patient had a decrease in activity level because of a fear of falling?  No    Is the patient reluctant to leave their home because of a fear of falling?  No      Prior Functional Status   Cognitive/Linguistic Baseline Within functional limits    Type of Home Apartment     Lives With Son    Available Support Family    Vocation Full time employment   Pharmacist, hospital, cut Scientist, clinical (histocompatibility and immunogenetics)   Overall Cognitive Status Within Functional Limits for tasks assessed      Auditory Comprehension   Overall Auditory Comprehension Impaired    Yes/No Questions Impaired    Basic Immediate Environment Questions 75-100% accurate    Complex Questions 25-49% accurate    Commands Impaired    Two Step Basic Commands 75-100% accurate    Multistep Basic Commands 50-74% accurate    Complex Commands 50-74% accurate    Conversation Simple    EffectiveTechniques Extra processing time;Pausing;Repetition;Slowed speech;Visual/Gestural cues      Reading Comprehension   Reading Status Impaired    Word level 76-100% accurate    Sentence Level 76-100% accurate    Paragraph Level 51-75% accurate      Expression   Primary Mode of Expression Verbal      Verbal Expression   Overall Verbal Expression Impaired    Initiation Impaired    Automatic Speech Counting;Day of week   stuttering, halting, perseveration   Level of Generative/Spontaneous Verbalization Sentence    Repetition Impaired    Level of Impairment Sentence level    Naming Impairment    Responsive Not tested     Confrontation 75-100% accurate    Convergent Not tested    Divergent 25-49% accurate    Verbal Errors Perseveration;Phonemic paraphasias;Aware of errors    Pragmatics No impairment    Effective Techniques Phonemic cues;Sentence completion;Written cues      Written Expression   Dominant Hand Left    Written Expression Exceptions to [facility]    Dictation Ability Phrase    Self Formulation Ability Phrase      Oral Motor/Sensory Function   Overall Oral Motor/Sensory Function Appears within functional limits for tasks assessed      Motor Speech   Overall Motor Speech Impaired    Respiration Within functional limits    Level of Impairment Phrase    Intelligibility Intelligible    Word 75-100% accurate    Phrase 75-100% accurate  Sentence 75-100% accurate    Conversation 75-100% accurate    Motor Planning Impaired    Level of Impairment Phrase    Motor Speech Errors Groping for words;Aware   stutter, perseveration   Effective Techniques Slow rate;Pause      Standardized Assessments   Standardized Assessments  --   Quick Aphasia Battery 5.89/10 moderate aphasia               SLP Education - 10/10/20 1533    Education Details goals, impariment, introduced compensations    Person(s) Educated Patient    Methods Explanation;Demonstration    Comprehension Verbal cues required;Need further instruction            SLP Short Term Goals - 10/10/20 1745      SLP SHORT TERM GOAL #1   Title Pt will carryover compensations for verbal apraxia in structured speech task 18/20 sentences    Time 4    Period Weeks    Status New      SLP SHORT TERM GOAL #2   Title Pt will name 10 items for a given cateory with rare min A    Time 4    Period Weeks    Status New      SLP SHORT TERM GOAL #3   Title Pt will write/text at sentence level IDing and correcting errors with occasional min A    Time 4    Period Weeks    Status New      SLP SHORT TERM GOAL #4   Title Pt will use verbal  compensations for aphasia in structured tasks with occasional min A 8/10 opportunities    Time 4    Period Weeks    Status New      SLP SHORT TERM GOAL #5   Title Administer PRO for aphasia & modifey LTGs as needed    Time 2    Period Weeks    Status New            SLP Long Term Goals - 10/10/20 1748      SLP LONG TERM GOAL #1   Title Pt will report making business type phone calls and ordering his own food with compensatory strategies (internal or external) with occasional min A from family over 1 week    Time 8    Period Weeks    Status New      SLP LONG TERM GOAL #2   Title Pt will increase score on PRO for aphasia by 4 points indicating improved life participation    Time 8    Period Weeks    Status New      SLP LONG TERM GOAL #3   Title Pt will carryover compensations for verbal apraxia to report 25% improved fluency (subjectively) during 15 minute conversations over 2 sessions    Time 8    Period Weeks    Status New      SLP LONG TERM GOAL #4   Title Pt will ID errors in written communication (text, emails, sentences etc) and correct them 8/10 opportunities with occasional min A    Time 8    Period Weeks    Status New            Plan - 10/10/20 1543    Clinical Impression Statement Mr. Leo Fray is referred for oupt ST due to ongoing aphasia from CVA 07/16/20. He did not received ST at this time, but was hosptialized 09/26/20 for other CVA, and is  referred to ST. 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 Picture description and conversation have reduced speech rate, MLU and agrammatism.  When describing his son: "He's uh uh uh hmm he uh he's quiet." Filler's, halting and word repetition affect his ability to accurately convey his message. Auditory comprehension at word level and simple yes/no questions in intact, but impaired at  complex yes/no and commands. Confrontation naming is impaired for mid to lower frerquency words and divergent naming pt named 5 animals in 1 minute (20 is WNL). Repetition intact to 2 syllable words and reading aloud is intact to 1 syllable words. Reading comprehension intact for 2 step commands. Automatic speech with whole word repetition/preseveration and phonemic paraphasias. Verbal agility for multisyllabic words is impaired, irregular and halting. Avant lives with his adult son. At this time his son is making business calls for [employer] and is ordereing Alhassan's food at restaurants due to aphasia/verbal apraxia. Caydyn is not texting due to aphasia. Written expression at phrase and sentence level revealed aphasic errors in grammar and spelling. Larsen was unable to ID or self correct these. Maximillion denies cogntive changes and is managing meds, cooking and cleaning and managing finances independently. Kelan endorses he can't return to work with the severity of his aphasia and verbal apraxia. I recommend skilled ST to maximize communication for safety, independence and possible return to work (if cleared medically)    Speech Therapy Frequency 2x / week    Duration 8 weeks   17 visits   Treatment/Interventions Cueing hierarchy;Environmental controls;Language facilitation;SLP instruction and feedback;Compensatory strategies;Functional tasks;Compensatory techniques;Cognitive reorganization;Patient/family education;Multimodal communcation approach;Internal/external aids    Potential to Achieve Goals Good    Potential Considerations Severity of impairments           Patient will benefit from skilled therapeutic intervention in order to improve the following deficits and impairments:   Aphasia  Verbal apraxia    Problem List Patient Active Problem List   Diagnosis Date Noted  . CVA (cerebral vascular accident) (HCC) 09/25/2020  . Hyperlipidemia   . Atrial fibrillation (HCC) 08/13/2020  . Acute  venous embolism and thrombosis of deep vessels of proximal lower extremity (HCC) 08/13/2020  . Long term (current) use of anticoagulants 08/13/2020  . LV (left ventricular) mural thrombus 07/25/2020  . Prolonged QT interval 07/25/2020  . Sinus tachycardia 07/20/2020  . Acute CVA (cerebrovascular accident) (HCC) 07/20/2020  . COVID-19 virus infection 07/19/2020  . Noncompliance with medication regimen 01/04/2020  . Hyperglycemia 01/04/2020  . Urine ketones 01/04/2020  . Essential hypertension 01/04/2020  . Nonischemic cardiomyopathy (HCC) 01/16/2017  . 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 Acute combined systolic and diastolic heart failure (HCC)   . Elevated troponin   . 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 Diabetes (HCC) 01/02/2017    Gevon Markus, Radene Journey MS, CCC-SLP 10/10/2020, 5:54 PM  [facility] [address] [place], [place], [address] Phone: [phone]   Fax:  [phone]   Name: Derian Dimalanta MRN: [id number] Date of Birth: [birth year]

## 2020-10-11 DIAGNOSIS — I509 Heart failure, unspecified: Secondary | ICD-10-CM | POA: Diagnosis not present

## 2020-10-12 ENCOUNTER — Ambulatory Visit: Payer: BC Managed Care – PPO

## 2020-10-12 ENCOUNTER — Inpatient Hospital Stay: Payer: Self-pay | Admitting: Family Medicine

## 2020-10-15 ENCOUNTER — Encounter: Payer: Self-pay | Admitting: Speech Pathology

## 2020-10-15 ENCOUNTER — Ambulatory Visit: Payer: BC Managed Care – PPO | Admitting: Speech Pathology

## 2020-10-15 ENCOUNTER — Other Ambulatory Visit: Payer: Self-pay

## 2020-10-15 DIAGNOSIS — R482 Apraxia: Secondary | ICD-10-CM | POA: Diagnosis not present

## 2020-10-15 DIAGNOSIS — R4701 Aphasia: Secondary | ICD-10-CM

## 2020-10-15 NOTE — Patient Instructions (Addendum)
  Aphasia affects all of language: verbal expression, written expression, reading comprehension and listening comprehension  You also have verbal apraxia which affects how your mouth muscles obey the brain (stuttering, halting, groping and repeating the same word (getting stuck)  Read aloud slowly like you did in therapy today  - so slow you feel a little silly 5 minutes twice a day  Slow you rate of speech when you are talking   To help Rodney Kane communicate:  Get the persons attention before you speak  Use eye contact and face the person you are speaking to  Be in close proximity to the person you are speaking to  Turn down any noise in the environment such as the TV, walk away from loud appliances, air conditioners, fans, dish washers etc  Speak to him in a slower, more relaxed manner (his speech with be affected if you are stressed, excited or in a hurry)  Give him extra time to respond and get his words out and try not to interrupt him  Group conversations may be harder to participate in for now - let family and friends know you have something to say by raising your finger or hand to get in the conversation if needed

## 2020-10-15 NOTE — Therapy (Signed)
[facility] [address] [place], [place], [address] Phone: [phone]   Fax:  [phone]  Speech Language Pathology Treatment  Patient Details  Name: Rodney Kane MRN: [id number] Date of Birth: Nov 27, 1975 Referring Provider (SLP): Dr. Delia Heady   Encounter Date: 10/15/2020   End of Session - 10/15/20 1603    Visit Number 2    Number of Visits 17    Date for SLP Re-Evaluation 12/05/20    SLP Start Time 1446    SLP Stop Time  1534    SLP Time Calculation (min) 48 min           Past Medical History:  Diagnosis Date  . Coronavirus infection 06/2020  . COVID 06/2020  . Diabetes mellitus without complication (HCC)   . Hemoglobin A1C greater than 9%, indicating poor diabetic control 12/2019  . Hyperglycemia 12/2019  . Hyperlipidemia 12/2019  . Hypertension   . Hypertensive urgency 12/2019  . Noncompliance with medication regimen 12/2019  . Nonischemic (hypertensive) dilated cardiomyopathy (HCC) 12/2016   EF has been 30 to 35%, recently lower.  . Stroke (HCC)   . Urine ketones 12/2019  . Vitamin D deficiency 12/2019    Past Surgical History:  Procedure Laterality Date  . left knee surgery    . RIGHT/LEFT HEART CATH AND CORONARY ANGIOGRAPHY N/A 01/05/2017   Procedure: Right/Left Heart Cath and Coronary Angiography;  Surgeon: [name], MD;  Location: [facility] INVASIVE CV LAB;  Service: Cardiovascular; angiograph normal coronaries.  PAP mean 19 million mercury, PCWP 15 mmHg.  LVEDP 9 mmHg.  Cardiac output/index 7.84/2.7.  RAP mean 4 mmHg.  Marland Kitchen TRANSTHORACIC ECHOCARDIOGRAM  01/04/2017   EF 30 to 35%.  Moderate LVH.  Moderate severely reduced EF.  Diffuse HK.  Normal valves.  . TRANSTHORACIC ECHOCARDIOGRAM  07/20/2020    Severely reduced LV function-EF 20 to 25%, global HK.  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  Mildly reduced cardiac function.  Unable to assess PA  pressures.  Moderate LA dilation.  Small pericardial effusion.  Relatively normal mitral valve and tricuspid valve.  Normal RA pressures.    There were no vitals filed for this visit.   Subjective Assessment - 10/15/20 1452    Subjective "They they uh they tell me to uh slow, slow down"    Currently in Pain? No/denies                 ADULT SLP TREATMENT - 10/15/20 1454      General Information   Behavior/Cognition Alert;Cooperative;Pleasant mood      Treatment Provided   Treatment provided Cognitive-Linquistic      Cognitive-Linquistic Treatment   Treatment focused on Apraxia;Aphasia;Patient/family/caregiver education    Skilled Treatment Initiated training in compensations for verbal apraxia with slow rate in oral reading short and longer sentences. In 10+ word sentences, dysfluency occurred 3/10 sentences 1x per sentence. Greatly reduced from dysfluency every 2-3 words in conversation. With modeling and verbal cues, Kennedy was able to maintain 90% fluency at sentence level in sentence generation task of describing objects with occasional min verbal cues and modeling. This task also trained for compensations for aphasia. Tyquavious generated accurate descriptions of simple objects that ST guessed 6/6 trials. Instructed him to use verbal descriptions when he encounters word finding difficulty. Administered The Communicative Participation Item Bank - Short form. Niel sccored a 26 (Higher points for less difficulty) He rated communicating when he wants to say something  quickly, communicating in a fast moving conversation and communicating out in the community as most difficult. Goal added to raise this score. Educated pt re: ways family can help him communicate, improve auditory comprehension and aphasia and verbal apraxia ed. 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      Assessment / Recommendations / Plan   Plan Continue with current  plan of care;Goals updated   added goal for pt reported outcome     Progression Toward Goals   Progression toward goals Progressing toward goals            SLP Education - 10/15/20 1554    Education Details aphasia, verbal apraxia, compensations, environmental compensations    Person(s) Educated Patient    Methods Explanation;Verbal cues;Handout;Demonstration    Comprehension Verbalized understanding;Returned demonstration;Verbal cues required;Need further instruction            SLP Short Term Goals - 10/15/20 1601      SLP SHORT TERM GOAL #1   Title Pt will carryover compensations for verbal apraxia in structured speech task 18/20 sentences    Time 4    Period Weeks    Status On-going      SLP SHORT TERM GOAL #2   Title Pt will name 10 items for a given cateory with rare min A    Time 4    Period Weeks    Status On-going      SLP SHORT TERM GOAL #3   Title Pt will write/text at sentence level IDing and correcting errors with occasional min A    Time 4    Period Weeks    Status On-going      SLP SHORT TERM GOAL #4   Title Pt will use verbal compensations for aphasia in structured tasks with occasional min A 8/10 opportunities    Time 4    Period Weeks    Status On-going      SLP SHORT TERM GOAL #5   Title Administer PRO for aphasia & modifey LTGs as needed    Time 2    Period Weeks    Status Achieved            SLP Long Term Goals - 10/15/20 1602      SLP LONG TERM GOAL #1   Title Pt will report making business type phone calls and ordering his own food with compensatory strategies (internal or external) with occasional min A from family over 1 week    Time 8    Period Weeks    Status On-going      SLP LONG TERM GOAL #2   Title Pt will increase score on PRO for aphasia by 4 points indicating improved life participation    Time 8    Period Weeks    Status On-going      SLP LONG TERM GOAL #3   Title Pt will carryover compensations for verbal apraxia  to report 25% improved fluency (subjectively) during 15 minute conversations over 2 sessions    Time 8    Period Weeks    Status On-going      SLP LONG TERM GOAL #4   Title Pt will ID errors in written communication (text, emails, sentences etc) and correct them 8/10 opportunities with occasional min A    Time 8    Period Weeks    Status On-going      SLP LONG TERM GOAL #5   Title  Pt will score 27 or higher on The Commnunicative Participation Item Bank Short Form    Time 8    Period Weeks    Status New            Plan - 10/15/20 1555    Clinical Impression Statement Tell 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 Reduced rate of speech improves fluency - ongoing training to generalize this to community. Training for compensations for aphasia, both written and verbal ongoing. Continue skilled ST to maximize communciation and intelligilbity for independence and possible return to work if cleared medically.    Speech Therapy Frequency 2x / week    Duration 8 weeks   17 visists   Treatment/Interventions Cueing hierarchy;Environmental controls;Language facilitation;SLP instruction and feedback;Compensatory strategies;Functional tasks;Compensatory techniques;Cognitive reorganization;Patient/family education;Multimodal communcation approach;Internal/external aids    Potential to Achieve Goals Good           Patient will benefit from skilled therapeutic intervention in order to improve the following deficits and impairments:   Aphasia  Verbal apraxia    Problem List Patient Active Problem List   Diagnosis Date Noted  . CVA (cerebral vascular accident) (HCC) 09/25/2020  . Hyperlipidemia   . Atrial fibrillation (HCC) 08/13/2020  . Acute venous embolism and thrombosis of deep vessels of proximal lower extremity (HCC) 08/13/2020  . LV (left ventricular) mural thrombus 07/25/2020  . Prolonged QT interval 07/25/2020  . Sinus tachycardia 07/20/2020   . Acute CVA (cerebrovascular accident) (HCC) 07/20/2020  . COVID-19 virus infection 07/19/2020  . Noncompliance with medication regimen 01/04/2020  . Hyperglycemia 01/04/2020  . Urine ketones 01/04/2020  . Essential hypertension 01/04/2020  . Nonischemic cardiomyopathy (HCC) 01/16/2017  . 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 Acute combined systolic and diastolic heart failure (HCC)   . Elevated troponin   . 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 Diabetes (HCC) 01/02/2017    Bryahna Lesko, Radene Journey MS, CCC-SLP 10/15/2020, 4:03 PM  [facility] [name] [place], [place], [address] Phone: [phone]   Fax:  [phone]   Name: Rosa Gambale MRN: [id number] Date of Birth: November 18, 1975

## 2020-10-17 ENCOUNTER — Ambulatory Visit: Payer: BC Managed Care – PPO | Admitting: Speech Pathology

## 2020-10-22 ENCOUNTER — Ambulatory Visit: Payer: BC Managed Care – PPO | Admitting: Speech Pathology

## 2020-10-22 ENCOUNTER — Telehealth: Payer: Self-pay | Admitting: Licensed Clinical Social Worker

## 2020-10-22 ENCOUNTER — Telehealth: Payer: Self-pay | Admitting: Speech Pathology

## 2020-10-22 NOTE — Telephone Encounter (Signed)
Spoke with Rodney Kane re: no show for 2 ST appointments. Rodney Kane reported his insurance has been cancelled and he is trying to get in touch with disability office. I provided him the phone number for our financial assistance/patient customer service. Explained we will cancel next 2 weeks appointments as he works on filing for disability.

## 2020-10-22 NOTE — Progress Notes (Signed)
Heart and Vascular Care Navigation  10/22/2020  Rodney Kane [birth year] [id number]  Reason for Referral:    Engaged with patient by telephone for follow up visit for Heart and Vascular Care Coordination.                                                                                                   Assessment:             Received an email from [facility] Disability Specialist Victorino Dike. She shares that she spoke with pt this afternoon and he mentioned disability application. They are willing to assist pt with this application at this time but need formal referral.   I call and was able to reach pt at [phone]. Pt shares that he has lost his insurance from prior employer. It is unclear if he ever contacted his PCP office regarding paperwork for Short Term Disability or FMLA. Pt states understanding that I have sent CAFA application and the release of information for [facility] to help him with the Disability filing. I encouraged him to call his HR department from his previous job and clarify if he is eligible for COBRA or any additional coverage from that job. Pt encouraged to come to appointments and request that they bill him/work on a payment plan as we would like to help pt continue to get consistent coverage and avoid a hospital admission/larger bill down the road from not maintaining appointments. Pt states understanding.    HRT/VAS Care Coordination    Patients Home Cardiology Office [facility]   Outpatient Care Team Social Worker   Social Worker Name: Esmeralda Links [place], [phone]   Living arrangements for the past 2 months Apartment   Lives with: Adult Children   Patient Current Insurance Scientist, water quality   Patient Has Concern With Paying Medical Bills No   Does Patient Have Prescription Coverage? Yes   Home Assistive Devices/Equipment None   DME Agency NA   [facility] Agency [employer] [facility]       Social History:                                                                             SDOH Screenings   Alcohol Screen: Not on file  Depression (PHQ2-9): Low Risk   . PHQ-2 Score: 0  Financial Resource Strain: High Risk  . Difficulty of Paying Living Expenses: Hard  Food Insecurity: No Food Insecurity  . Worried About Programme researcher, broadcasting/film/video in the Last Year: Never true  . Ran Out of Food in the Last Year: Never true  Housing: Low Risk   . Last Housing Risk Score: 0  Physical Activity: Inactive  . Days of Exercise per Week: 0 days  . Minutes of Exercise per Session: 0 min  Social Connections: Not on file  Stress: Stress Concern Present  . Feeling of Stress : To some extent  Tobacco Use: Low Risk   . Smoking Tobacco Use: Never Smoker  . Smokeless Tobacco Use: Never Used  Transportation Needs: No Transportation Needs  . Lack of Transportation (Medical): No  . Lack of Transportation (Non-Medical): No    SDOH Interventions: Financial Resources:  Financial Strain Interventions: Other (Comment) (already working w/ [employer] for [employer]; mailed CAFA and Disability release papework)   Food Insecurity:   already working w/ [employer] on [employer] Insecurity:   lives w/ son  Transportation:    can drive self, also has been enrolled in Higher education careers adviser.    Follow-up plan:   Mailed documents through to pt, encouraged him to call me Friday if he does not receive them. Once he has signed the release of information for disability he is aware he should bring it by [facility] office and I can send through formal referral for disability application process to [facility]  Pt has my number for any questions/concerns before that time.

## 2020-10-22 NOTE — Telephone Encounter (Deleted)
Received an email from [facility] Disability Specialist Victorino Dike. She shares that she spoke with pt this afternoon and he mentioned disability application. They are willing to assist pt with this application at this time but need formal referral.   I call and was able to reach pt at [phone]. Pt shares that he has lost his insurance from prior employer. It is unclear if he ever contacted his PCP office regarding paperwork for Short Term Disability or FMLA. Pt states understanding that I have sent CAFA application and the release of information for [facility] to help him with the Disability filing. I encouraged him to call his HR department from his previous job and clarify if he is eligible for COBRA or any additional coverage from that job. Pt encouraged to come to appointments and request that they bill him/work on a payment plan as we would like to help pt continue to get consistent coverage and avoid a hospital admission/larger bill down the road from not maintaining appointments. Pt states understanding.   Octavio Graves, MSW, LCSW [facility] Health Heart/Vascular Care Navigation  [phone]

## 2020-10-22 NOTE — Telephone Encounter (Signed)
Pt has been enrolled in [employer] and has previously been given my information should he have any challenges but has cancelled/no show the past three appointments w/ our pharmacy team. Noted that he has an eligible outstanding balance to apply for CAFA, I have mailed him information about the application and my contact information to remind him I remain available as needed. Remain available as needed for pt moving forward.    Rodney Kane, MSW, LCSW [facility] Health Heart/Vascular Care Navigation  [phone]

## 2020-10-23 ENCOUNTER — Other Ambulatory Visit: Payer: Self-pay | Admitting: *Deleted

## 2020-10-23 NOTE — Patient Outreach (Signed)
[facility]) Care Management  10/23/2020  Rodney Kane 11-10-1975 [id number]   Outgoing call placed to member to follow up on stroke recovery.  State he has been monitoring blood pressure and blood sugar, both stable (172 and 112/89 today).  He had started speech therapy but this has been put on pause due to lapse of insurance.  He has LCSW with cardiology already working on disability paperwork and insurance to restart services.  He report he has been taking medications, insulin remains on hold.  He has been able to afford all meds currently, denies any questions/concerns.  Will close case at this time as member no longer eligible for services due to insurance.  He will contact this care manager with questions.  Goals Addressed            This Visit's Progress   . COMPLETED: [facility] - Find Help in My Community       Timeframe:  Short-Term Goal Priority:  High Start Date:       3/2                 Expected End Date:       4/2  Follow Up Date 3/22   - follow-up on any referrals for help I am given - make a list of family or friends that I can call    Why is this important?    Knowing how and where to find help for yourself or family in your neighborhood and community is an important skill.   You will want to take some steps to learn how.    Notes:   1/28 - Confirmed member has been in contact with [facility] pharmacist  1/20 - Referral placed to Remote Health - not eligible due to age  20/17 - Call placed to PCP office to request referral to outpatient speech therapy  3/2 - accompanied member to PCP office, referral received for ST    . COMPLETED: [facility] - Make and Keep All Appointments       Timeframe:  Short-Term Goal Priority:  Medium Start Date:          3/2           Expected End Date:       4/2                Follow Up Date 3/22   - ask family or friend for a ride - keep a calendar with appointment dates    Why is this important?    Part of  staying healthy is seeing the doctor for follow-up care.   If you forget your appointments, there are some things you can do to stay on track.    Notes:   1/28 - Call placed to reschedule cardiology visit  2/17 - Upcoming appointments reviewed with member  3/2 - Reviewed upcoming appointments, assessed need for transportation resources  4/5 - Case closed, member no longer eligible for services    . COMPLETED: [facility] - Track and Manage My Blood Pressure-Hypertension       Timeframe:  Long-Range Goal Priority:  Medium Start Date:         08/09/2020                    Expected End Date:  10/07/2020                     Follow Up Date 4/4   - check  blood pressure daily - write blood pressure results in a log or diary    Why is this important?    You won't feel high blood pressure, but it can still hurt your blood vessels.   High blood pressure can cause heart or kidney problems. It can also cause a stroke.   Making lifestyle changes like losing a little weight or eating less salt will help.   Checking your blood pressure at home and at different times of the day can help to control blood pressure.   If the doctor prescribes medicine remember to take it the way the doctor ordered.   Call the office if you cannot afford the medicine or if there are questions about it.     Notes:   1/28 - Reminded to monitor blood pressure and record daily  2/17 - Confirmed member has BP monitor, will send scale for daily weights  3/2 - Reviewed medication changes, reminded to monitor and record  4/5 - Case closed, member no longer eligible for services      [name], RN, MSN [facility] Care Management  [facility] Manager [phone]

## 2020-10-24 ENCOUNTER — Ambulatory Visit: Payer: BC Managed Care – PPO | Admitting: Speech Pathology

## 2020-10-26 ENCOUNTER — Telehealth: Payer: Self-pay | Admitting: Licensed Clinical Social Worker

## 2020-10-26 NOTE — Telephone Encounter (Signed)
LCSW attempted to reach pt regarding paperwork sent to formally refer pt for disability application to [facility]. No answer at [phone], phone rings and then disconnects as usual. Will reattempt Monday to see if pt has received paperwork/can bring it to [facility] office or mail back.   Octavio Graves, MSW, LCSW [facility] Health Heart/Vascular Care Navigation  [phone]

## 2020-10-29 ENCOUNTER — Other Ambulatory Visit: Payer: Self-pay

## 2020-10-29 ENCOUNTER — Other Ambulatory Visit (HOSPITAL_COMMUNITY): Payer: Self-pay

## 2020-10-29 ENCOUNTER — Telehealth: Payer: Self-pay | Admitting: Licensed Clinical Social Worker

## 2020-10-29 ENCOUNTER — Ambulatory Visit: Payer: BC Managed Care – PPO | Admitting: Speech Pathology

## 2020-10-29 NOTE — Telephone Encounter (Signed)
Called to f/u on letter sent to pt for him to sign to intiate the process for disability w/ [employer]. Pt shares he needs his son to check the mail since he has the key. He will call me if he hasnt gotten a letter by Wednesday.   Rodney Kane, MSW, LCSW [facility] Health Heart/Vascular Care Navigation  [phone]

## 2020-10-30 ENCOUNTER — Telehealth: Payer: Self-pay | Admitting: Licensed Clinical Social Worker

## 2020-10-30 NOTE — Telephone Encounter (Signed)
Pt called this Clinical research associate, shares he mistakenly took his documents to [facility]. Since it is difficult for pt to follow/complete things over the phone I have inquired if he could come to office to go over assistance applications/meet with me.   Pt shares he should be able to come, he will call me tomorrow morning and schedule a time after 11am to meet. LCSW made a note of this and will f/u if I do not hear from him. I plan to encourage him to have his children work on applications with him/come to appointments if able to ensure understanding of what is needed/ongoing care needs.   Rodney Kane, MSW, LCSW [facility] Health Heart/Vascular Care Navigation  [phone]

## 2020-10-30 NOTE — Telephone Encounter (Signed)
Pt called back regarding assistance applications- but unable to reach him x3 via telephone. Phone rings and then disconnects. Will reattempt this afternoon.   Octavio Graves, MSW, LCSW [facility] Health Heart/Vascular Care Navigation  [phone]

## 2020-10-31 ENCOUNTER — Telehealth: Payer: Self-pay | Admitting: Licensed Clinical Social Worker

## 2020-10-31 ENCOUNTER — Ambulatory Visit: Payer: BC Managed Care – PPO | Attending: Internal Medicine

## 2020-10-31 DIAGNOSIS — R4701 Aphasia: Secondary | ICD-10-CM | POA: Insufficient documentation

## 2020-10-31 DIAGNOSIS — R482 Apraxia: Secondary | ICD-10-CM | POA: Insufficient documentation

## 2020-10-31 NOTE — Progress Notes (Signed)
Heart and Vascular Care Navigation  10/31/2020  Rodney Kane Jul 16, 1976 [id number]  Reason for Referral:  Engaged with patient face to face for follow up visit for Heart and Vascular Care Coordination.  Assistance applications; disability referral                                                                                                   Assessment: LCSW met with pt at [facility] office. I provided pt with printed instructions of what documents we had completed and what documents he needed to collect/complete before submission.   LCSW assisted pt with the following: - Xarelto PAP application - Disability referral to [place] [facility]ist application                                       HRT/VAS Care Coordination    Patients Home Cardiology Office [place] Team Social Worker   Social Worker Name: Margarito Liner [place], [phone]   Living arrangements for the past 2 months Apartment   Lives with: Adult Children   Patient Current Librarian, academic   Patient Has Concern With [place] No   Does Patient Have Prescription Coverage? Yes   Home Assistive Devices/Equipment None   DME Agency NA   [place]      Social History:                                                                             SDOH Screenings   Alcohol Screen: Not on file  Depression (PHQ2-9): Low Risk   . PHQ-2 Score: 0  Financial Resource Strain: High Risk  . Difficulty of Paying Living Expenses: Hard  Food Insecurity: No Food Insecurity  . Worried About Charity fundraiser in the Last Year: Never true  . Ran Out of Food in the Last Year: Never true  Housing: Low Risk   . Last Housing Risk Score: 0  Physical Activity: Inactive  . Days of Exercise per Week: 0 days  . Minutes of Exercise per Session: 0 min  Social  Connections: Not on file  Stress: Stress Concern Present  . Feeling of Stress : To some extent  Tobacco Use: Low Risk   . Smoking Tobacco Use: Never Smoker  . Smokeless Tobacco Use: Never Used  Transportation Needs: No Transportation Needs  . Lack of Transportation (Medical): No  . Lack of Transportation (Non-Medical): No    SDOH Interventions: Financial Resources:  Financial Strain Interventions: Other (Comment) (started CAFA and [employer]; disability referral to [employer])   Food Insecurity:  Food Insecurity Interventions:  Other (Comment) (provided Sales promotion account executive; confirmed pt has started recieving SNAP)  Housing Insecurity:   lives w/ son who currently covers expenses  Transportation:    pt able to drive, can utilize Chiropractor as needed   Other Care Navigation Interventions:     Provided Pharmacy assistance resources  Pt completed his portion of Xarelto application, will leave for provider to complete his portion.    Follow-up plan:   LCSW has sent in [id number] & form 882 for pt to receive a letter of nonfiling. Pt working on collecting additional documentation needed. LCSW also sent referral to [facility]. Once documents collected will assist with sending in application. Pt has provided me his "boss man"'s number and given me permission to contact him as there is some confusion/difficulty understanding pt current employment status. LCSW also assisted pt with rescheduling missed appointment w/ our clinic and have reached out to [facility] [place] where pt PCP has left but he was a pt.

## 2020-11-01 ENCOUNTER — Telehealth: Payer: Self-pay | Admitting: Licensed Clinical Social Worker

## 2020-11-01 NOTE — Telephone Encounter (Signed)
LCSW reached out to [place] at [employer] to better understand pt current employment status for assistance applications. Pt shared with this Clinical research associate yesterday that he doesn't think he has insurance anymore, that he's still employed but is not clear about what his current benefits are (FMLA vs Short Term Disability). 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 [name] was unable to reach company benefits department but took my name and number down and will reach out to me. LCSW also called BCBS provider line and was able to verify that pt still covered under that plan which started 07/21/2020 through current. LCSW awaits clarity from [employer] to complete applications with pt.   Rodney Kane, MSW, LCSW [facility] Health Heart/Vascular Care Navigation  [phone]

## 2020-11-05 ENCOUNTER — Ambulatory Visit: Payer: BC Managed Care – PPO

## 2020-11-06 ENCOUNTER — Telehealth: Payer: Self-pay | Admitting: Licensed Clinical Social Worker

## 2020-11-06 NOTE — Telephone Encounter (Signed)
LCSW attempted to reach pt again via telephone. Not able to reach. [employer] got back in touch with me and let me know the following: pt still eligible for health insurance and currently does not have an end date for his coverage. They have been sending pt request for partial payment and let me know that pt can call for additional information.   Pt was not approved for FMLA or Short Term Disability currently according to the representative since he did not get paperwork back into their office.    I sent pt a letter with the above informaiton as well as contact information from his company should he want to apply for benefits. [employer] (short term disability)- [phone]; [employer]ML[employer] Source (FMLA)- [phone].  Because pt currently does still have health insurance coverage at this time, I am not going to be able to submit for the [facility] Medassist (for medication assistance) or the [employer]. I let him know in the letter that we can still work on the [employer] if he is able to gather the needed documents.   Will reattempt again to reach pt as able.   Octavio Graves, MSW, LCSW [facility] Health Heart/Vascular Care Navigation  [phone]

## 2020-11-06 NOTE — Telephone Encounter (Signed)
Attempted to reach pt to discuss insurance and assistance applications reviewed last week. No answer, no voicemail. Will re-attempt as able.   Rodney Kane, MSW, LCSW [facility] Health Heart/Vascular Care Navigation  [phone]

## 2020-11-07 ENCOUNTER — Ambulatory Visit: Payer: BC Managed Care – PPO

## 2020-11-07 ENCOUNTER — Telehealth: Payer: Self-pay | Admitting: Licensed Clinical Social Worker

## 2020-11-07 NOTE — Telephone Encounter (Signed)
Pt called this writer back, he has just come back in town and thinks he has gathered all needed documents for financial assistance applications but the letter of SNAP. LCSW shared what I had learned from speaking with the benefits specialist at his job and let him know that I had sent him information for him to f/u with them regarding ongoing insurance coverage and any additional programs he may be eligible for through his employer. Once all documents gathered I encouraged pt to call me to bring them to office for me to look over and if complete we can send in application for CAFA as pt current outstanding billing makes him eligible even though he does have insurance coverage at this time.    Octavio Graves, MSW, LCSW [facility] Health Heart/Vascular Care Navigation  [phone]

## 2020-11-12 ENCOUNTER — Telehealth: Payer: Self-pay | Admitting: Licensed Clinical Social Worker

## 2020-11-12 ENCOUNTER — Ambulatory Visit: Payer: BC Managed Care – PPO | Admitting: Speech Pathology

## 2020-11-12 ENCOUNTER — Telehealth: Payer: Self-pay | Admitting: Speech Pathology

## 2020-11-12 NOTE — Progress Notes (Signed)
Heart and Vascular Care Navigation  11/12/2020  Rodney Kane 1976/01/04 [id number]  Reason for Referral:  Engaged with patient face to face for follow up visit for Heart and Vascular Care Coordination.                                                                                                   Assessment:         Pt came to office in person w/ documents. He has gathered all needed documents and will keep his eyes open for the letter from the IRS with his letter of non-filing. LCSW made copies of both CAFA and [employer]. I shared that pt still is insured and we cannot submit the [employer] or [employer]. He can still submit the CAFA application as his outstanding balance is >$5,000. Pt has my number for any ongoing questions/concerns.                                HRT/VAS Care Coordination    Patients Home Cardiology Office [facility]   Outpatient Care Team Social Worker   Social Worker Name: Esmeralda Links [place], [phone]   Living arrangements for the past 2 months Apartment   Lives with: Adult Children   Patient Current Insurance Scientist, water quality   Patient Has Concern With Paying Medical Bills Yes   Patient Concerns With Medical Bills multiple inpatient visits w/ outstanding balance   Medical Bill Referrals: pt had started CAFA   Does Patient Have Prescription Coverage? Yes   Home Assistive Devices/Equipment None   DME Agency NA   [facility] Agency [employer] [facility]      Social History:                                                                             SDOH Screenings   Alcohol Screen: Not on file  Depression (PHQ2-9): Low Risk   . PHQ-2 Score: 0  Financial Resource Strain: High Risk  . Difficulty of Paying Living Expenses: Hard  Food Insecurity: No Food Insecurity  . Worried About Programme researcher, broadcasting/film/video in the Last Year: Never true  . Ran Out of Food in the Last Year: Never  true  Housing: Low Risk   . Last Housing Risk Score: 0  Physical Activity: Inactive  . Days of Exercise per Week: 0 days  . Minutes of Exercise per Session: 0 min  Social Connections: Not on file  Stress: Stress Concern Present  . Feeling of Stress : To some extent  Tobacco Use: Low Risk   . Smoking Tobacco Use: Never Smoker  . Smokeless Tobacco Use: Never Used  Transportation Needs: No Transportation Needs  . Lack of Transportation (Medical):  No  . Lack of Transportation (Non-Medical): No    SDOH Interventions: Financial Resources:  Financial Strain Interventions: Other (Comment) (f/u on CAFA/assistance applications)     Follow-up plan:   I will f/u with pt later this week as it was noted after he left that the utility bill he brought me was a notice of disconnection from [employer]. Pt has my number if any additional questions/concerns may arise.

## 2020-11-12 NOTE — Telephone Encounter (Signed)
Pt called sharing that he has gathered needed documentation but was having challenges w/ getting the [facility] letter. LCSW shared that he could go to the DSS office directly to get that letter, provided him with the appropriate address. Pt states understanding, shares he will come by the office with applications/information when received. He has not received my information that was sent to him w/ his employers contact information to review benefits.   Octavio Graves, MSW, LCSW [facility] Health Heart/Vascular Care Navigation  [phone]

## 2020-11-12 NOTE — Telephone Encounter (Signed)
Attempted to call Mr. Varnell re: missed ST appointment today. No answer. Next appointment Wed 11/27 with me. If no show, will cancel remaining appointments.  Clinton Sawyer MS, CCC-SLP

## 2020-11-14 ENCOUNTER — Ambulatory Visit: Payer: BC Managed Care – PPO | Admitting: Speech Pathology

## 2020-11-14 ENCOUNTER — Telehealth: Payer: Self-pay | Admitting: Licensed Clinical Social Worker

## 2020-11-14 ENCOUNTER — Other Ambulatory Visit: Payer: Self-pay

## 2020-11-14 ENCOUNTER — Encounter: Payer: Self-pay | Admitting: Speech Pathology

## 2020-11-14 DIAGNOSIS — R4701 Aphasia: Secondary | ICD-10-CM | POA: Diagnosis not present

## 2020-11-14 DIAGNOSIS — R482 Apraxia: Secondary | ICD-10-CM | POA: Diagnosis not present

## 2020-11-14 NOTE — Telephone Encounter (Signed)
LCSW received call back from pt [phone]). He shares that he is aware and scheduled to go to [facility] for interview for disability next week. I recommended he let us know if he cant make this appointment as I can alert Victorino Dike if it needs to change.   LCSW also shared that I noted that pt prior bill has a disconnection notice at the top. Inquired if pt was aware, pt shares he was not. LCSW recommended he call [employer] and make them aware of current financial situation and request a payment plan. Pt states he has already done so, however it is unclear if this has been done based on bill. I also encouraged pt to go to DSS with notice and speak with them about financial assistance programs if any. Once this has been done I encouraged pt to call me back to see if we can financially assist to ensuring power does not get cut off.   Octavio Graves, MSW, LCSW [facility] Health Heart/Vascular Care Navigation  [phone]

## 2020-11-14 NOTE — Therapy (Signed)
[facility] [address] [place], [place], [address] Phone: [phone]   Fax:  [phone]  Speech Language Pathology Treatment  Patient Details  Name: Rodney Kane MRN: [id number] Date of Birth: [birth year] Referring Provider (SLP): Dr. Delia Heady   Encounter Date: 11/14/2020   End of Session - 11/14/20 1513    Visit Number 3    Number of Visits 17    Date for SLP Re-Evaluation 12/28/20   extended as pt missed 3 weeks of ST   SLP Start Time 1415    SLP Stop Time  1500    SLP Time Calculation (min) 45 min    Activity Tolerance Patient tolerated treatment well           Past Medical History:  Diagnosis Date  . Coronavirus infection 06/2020  . COVID 06/2020  . Diabetes mellitus without complication (HCC)   . Hemoglobin A1C greater than 9%, indicating poor diabetic control 12/2019  . Hyperglycemia 12/2019  . Hyperlipidemia 12/2019  . Hypertension   . Hypertensive urgency 12/2019  . Noncompliance with medication regimen 12/2019  . Nonischemic (hypertensive) dilated cardiomyopathy (HCC) 12/2016   EF has been 30 to 35%, recently lower.  . Stroke (HCC)   . Urine ketones 12/2019  . Vitamin D deficiency 12/2019    Past Surgical History:  Procedure Laterality Date  . left knee surgery    . RIGHT/LEFT HEART CATH AND CORONARY ANGIOGRAPHY N/A 01/05/2017   Procedure: Right/Left Heart Cath and Coronary Angiography;  Surgeon: [name], MD;  Location: [facility] INVASIVE CV LAB;  Service: Cardiovascular; angiograph normal coronaries.  PAP mean 19 million mercury, PCWP 15 mmHg.  LVEDP 9 mmHg.  Cardiac output/index 7.84/2.7.  RAP mean 4 mmHg.  Marland Kitchen TRANSTHORACIC ECHOCARDIOGRAM  01/04/2017   EF 30 to 35%.  Moderate LVH.  Moderate severely reduced EF.  Diffuse HK.  Normal valves.  . TRANSTHORACIC ECHOCARDIOGRAM  07/20/2020    Severely reduced LV function-EF 20 to 25%, global HK.  Relatively small (8 mm) spherical  slightly mobile LV thrombus noted.  Mildly reduced cardiac function.  Unable to assess PA pressures.  Moderate LA dilation.  Small pericardial effusion.  Relatively normal mitral valve and tricuspid valve.  Normal RA pressures.    There were no vitals filed for this visit.   Subjective Assessment - 11/14/20 1419    Subjective "I thought it was cancelled, but they said it is not" re: insurance    Currently in Pain? No/denies                 ADULT SLP TREATMENT - 11/14/20 1419      General Information   Behavior/Cognition Alert;Cooperative;Pleasant mood      Treatment Provided   Treatment provided Cognitive-Linquistic      Cognitive-Linquistic Treatment   Treatment focused on Apraxia;Aphasia;Patient/family/caregiver education    Skilled Treatment Rodney Kane has missed 3 weeks of ST due to confusion re: lapse of insurance. He enters room dysfluent with reduced intelligilbily. Oral reading task to reduce rate of speech, Rodney Kane maintained 90% fluent speech. He endorses fatigue and emotions worsen his speech. In Strucutured speech task with tri-syllabic words, Rodney Kane required occasional min to reduce rate for improved fluency. Provided an aphasia ID card. In conversation, Rodney Kane required frequent cues and modeling to reduce rate. Educated Rodney Kane that stress/emotions affect speech/language. He endorses worsen speech/language with buisiness calls. Trained in strategies of writing down questions, key words/points, practicing before call and using written cues durin  phone call to reduce pressure/stress. Rodney Kane required frequent quetioning cues to use compensations for aphasia today      Assessment / Recommendations / Plan   Plan Continue with current plan of care;Goals updated      Progression Toward Goals   Progression toward goals Progressing toward goals            SLP Education - 11/14/20 1507    Education Details compensations for verbal apraxia, compensations with business calls,  provided aphasia ID care    Person(s) Educated Patient    Methods Explanation;Demonstration;Verbal cues;Handout    Comprehension Returned demonstration;Verbal cues required;Need further instruction;Verbalized understanding            SLP Short Term Goals - 11/14/20 1512      SLP SHORT TERM GOAL #1   Title Pt will carryover compensations for verbal apraxia in structured speech task 18/20 sentences    Time 3    Period Weeks    Status On-going      SLP SHORT TERM GOAL #2   Title Pt will name 10 items for a given cateory with rare min A    Time 3    Period Weeks    Status On-going      SLP SHORT TERM GOAL #3   Title Pt will write/text at sentence level IDing and correcting errors with occasional min A    Time 3    Period Weeks    Status On-going      SLP SHORT TERM GOAL #4   Title Pt will use verbal compensations for aphasia in structured tasks with occasional min A 8/10 opportunities    Time 3    Period Weeks    Status On-going      SLP SHORT TERM GOAL #5   Title Administer PRO for aphasia & modifey LTGs as needed    Time 1    Period Weeks    Status Achieved            SLP Long Term Goals - 11/14/20 1512      SLP LONG TERM GOAL #1   Title Pt will report making business type phone calls and ordering his own food with compensatory strategies (internal or external) with occasional min A from family over 1 week    Time 7    Period Weeks    Status On-going      SLP LONG TERM GOAL #2   Title Pt will increase score on PRO for aphasia by 4 points indicating improved life participation    Time 7    Period Weeks    Status On-going      SLP LONG TERM GOAL #3   Title Pt will carryover compensations for verbal apraxia to report 25% improved fluency (subjectively) during 15 minute conversations over 2 sessions    Time 7    Period Weeks    Status On-going      SLP LONG TERM GOAL #4   Title Pt will ID errors in written communication (text, emails, sentences etc) and  correct them 8/10 opportunities with occasional min A    Time 8    Period Weeks    Status On-going      SLP LONG TERM GOAL #5   Title Pt will score 27 or higher on [employer] Particip[employer] Short Form    Time 7    Period Weeks    Status On-going            Plan -  11/14/20 1511    Clinical Impression Statement Rodney Kane 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 Reduced rate of speech improves fluency - ongoing training to generalize this to community. Training for compensations for aphasia, both written and verbal ongoing. Continue skilled ST to maximize communciation and intelligilbity for independence and possible return to work if cleared medically.    Speech Therapy Frequency 2x / week    Duration 8 weeks   17 visits   Treatment/Interventions Cueing hierarchy;Environmental controls;Language facilitation;SLP instruction and feedback;Compensatory strategies;Functional tasks;Compensatory techniques;Cognitive reorganization;Patient/family education;Multimodal communcation approach;Internal/external aids    Potential to Achieve Goals Good    Potential Considerations Severity of impairments           Patient will benefit from skilled therapeutic intervention in order to improve the following deficits and impairments:   Aphasia  Verbal apraxia    Problem List Patient Active Problem List   Diagnosis Date Noted  . CVA (cerebral vascular accident) (HCC) 09/25/2020  . Hyperlipidemia   . Atrial fibrillation (HCC) 08/13/2020  . Acute venous embolism and thrombosis of deep vessels of proximal lower extremity (HCC) 08/13/2020  . LV (left ventricular) mural thrombus 07/25/2020  . Prolonged QT interval 07/25/2020  . Sinus tachycardia 07/20/2020  . Acute CVA (cerebrovascular accident) (HCC) 07/20/2020  . COVID-19 virus infection 07/19/2020  . Noncompliance with medication regimen 01/04/2020  . Hyperglycemia 01/04/2020  . Urine ketones  01/04/2020  . Essential hypertension 01/04/2020  . Nonischemic cardiomyopathy (HCC) 01/16/2017  . 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 Acute combined systolic and diastolic heart failure (HCC)   . Elevated troponin   . 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 Diabetes (HCC) 01/02/2017    Rendell Kane, Radene Journey MS, CCC-SLP 11/14/2020, 3:14 PM  [facility] [address] [place], [place], [address] Phone: [phone]   Fax:  [phone]   Name: Maximillion Gill MRN: [id number] Date of Birth: 1976/01/08

## 2020-11-14 NOTE — Telephone Encounter (Signed)
Attempted to reach pt via telephone [phone] to discuss disconnection notice that was noted on documents received by this Clinical research associate. No answer, no voicemail set up. Will reattempt as able.   Octavio Graves, MSW, LCSW [facility] Heart/Vascular Care Navigation  [phone]

## 2020-11-14 NOTE — Patient Instructions (Addendum)
  Make sure you call here if you have to miss another appointment. We allow 3 no shows and you are near that. [phone]   With verbal apraxia, automatic speech comes out (cursing, counting, greeting etc)  Things you have to think about saying is when the dysfluency and halting occur. This is normal.  Emotions, stress , anger all make speech and word finding worse  Daily: Read 5 minutes very slowly twice a day  When you have to make a phone call - take a couple of minutes and read slowly to get your speech slowed down  Write down some things that you want to say or ask prior to a business call or conversation and practice it several times before you make the call  Let the other person know you 've had a stroke and your speech and language has been affected

## 2020-11-19 ENCOUNTER — Ambulatory Visit: Payer: BC Managed Care – PPO | Attending: Internal Medicine | Admitting: Speech Pathology

## 2020-11-21 ENCOUNTER — Ambulatory Visit: Payer: BC Managed Care – PPO | Admitting: Speech Pathology

## 2020-11-21 ENCOUNTER — Telehealth: Payer: Self-pay | Admitting: Speech Pathology

## 2020-11-21 NOTE — Telephone Encounter (Signed)
[place] has no showed for 2 ST appointments this week. Attempted to call, however no answer. Unfortunately, will have to cancel his remaining appointments due to multiple no shows. I will place him on hold. He will need to call back to reschedule ST appointments when he can attend consistently.  Clinton Sawyer MS, CCC-SLP

## 2020-11-22 ENCOUNTER — Telehealth: Payer: Self-pay | Admitting: Licensed Clinical Social Worker

## 2020-11-22 NOTE — Telephone Encounter (Signed)
LCSW followed up with [facility] and confirmed that pt was able to make his appointment with them yesterday. They are working on formally filing for disability and should have things submitted by next week.   Octavio Graves, MSW, LCSW [facility] Heart/Vascular Care Navigation  [phone]

## 2020-11-26 ENCOUNTER — Encounter: Payer: BC Managed Care – PPO | Admitting: Speech Pathology

## 2020-11-28 ENCOUNTER — Encounter: Payer: BC Managed Care – PPO | Admitting: Speech Pathology

## 2020-12-03 ENCOUNTER — Encounter: Payer: BC Managed Care – PPO | Admitting: Speech Pathology

## 2020-12-03 NOTE — Progress Notes (Signed)
Cardiology Clinic Note   Patient Name: Rodney Kane Date of Encounter: 12/04/2020  Primary Care Provider:  No primary care provider on file. 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  Past Medical History    Past Medical History:  Diagnosis Date  . Coronavirus infection 06/2020  . COVID 06/2020  . Diabetes mellitus without complication (HCC)   . Hemoglobin A1C greater than 9%, indicating poor diabetic control 12/2019  . Hyperglycemia 12/2019  . Hyperlipidemia 12/2019  . Hypertension   . Hypertensive urgency 12/2019  . Noncompliance with medication regimen 12/2019  . Nonischemic (hypertensive) dilated cardiomyopathy (HCC) 12/2016   EF has been 30 to 35%, recently lower.  . Stroke (HCC)   . Urine ketones 12/2019  . Vitamin D deficiency 12/2019   Past Surgical History:  Procedure Laterality Date  . left knee surgery    . RIGHT/LEFT HEART CATH AND CORONARY ANGIOGRAPHY N/A 01/05/2017   Procedure: Right/Left Heart Cath and Coronary Angiography;  Surgeon: [name], MD;  Location: [facility] INVASIVE CV LAB;  Service: Cardiovascular; angiograph normal coronaries.  PAP mean 19 million mercury, PCWP 15 mmHg.  LVEDP 9 mmHg.  Cardiac output/index 7.84/2.7.  RAP mean 4 mmHg.  Marland Kitchen TRANSTHORACIC ECHOCARDIOGRAM  01/04/2017   EF 30 to 35%.  Moderate LVH.  Moderate severely reduced EF.  Diffuse HK.  Normal valves.  . TRANSTHORACIC ECHOCARDIOGRAM  07/20/2020    Severely reduced LV function-EF 20 to 25%, global HK.  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  Mildly reduced cardiac function.  Unable to assess PA pressures.  Moderate LA dilation.  Small pericardial effusion.  Relatively normal mitral valve and tricuspid valve.  Normal RA pressures.    Allergies  No Known  Allergies  History of Present Illness    Rodney Kane has a PMH of diabetes, HTN, nonischemic cardiomyopathy-diagnosed 2018 in the setting of uncontrolled hypertension, preceded by URI symptoms, normal coronary arteries by cardiac catheterization, he was lost to cardiology follow-up and was not seen again until 12/21.  He was admitted for COVID-19.  He was noted to have CVA, LV thrombus, and had further decreased LVEF to 20-25%.  This was in the setting of noncompliance with his medications.  Medical management was recommended.  He was also noted to have some outpatient noncompliance issues.  He left AMA from his primary care office with severely elevated blood pressure.  He was readmitted 09/25/2020 with recurrent CVA in the context of subtherapeutic INR.  Cardiology was consulted for LV thrombus and continued severe LV dysfunction.  His echocardiogram 09/25/2020 showed LVEF less than 20%, G3 DD.  He was discharged on 09/27/2020 on Xarelto, losartan, carvedilol, and spironolactone.  He presents the clinic today for follow-up evaluation states over the last few weeks he has noticed some increased lower extremity swelling.  He presents to the clinic with his daughter today.  We reviewed his diet and he reports that he eats fresh and processed food.  We discussed low-sodium food options and the importance of not adding salt to his meals.  He reports compliance with his Xarelto and we reviewed his LV thrombus.  I will give him the [place] support stocking sheet.  I will titrate his losartan, order a BMP in 1 week, give him salty 6 diet sheet, have him continue to monitor  his weights and follow-up in 1 month.  Today he denies chest pain, shortness of breath, lower extremity edema, fatigue, palpitations, melena, hematuria, hemoptysis, diaphoresis, weakness, presyncope, syncope, orthopnea, and PND.  Home Medications    Prior to Admission medications   Medication Sig Start Date End Date Taking? Authorizing  Provider  albuterol (VENTOLIN HFA) 108 (90 Base) MCG/ACT inhaler Inhale 2 puffs into the lungs every 6 (six) hours as needed for wheezing or shortness of breath. 09/19/20   Kallie Locks, FNP  atorvastatin (LIPITOR) 10 MG tablet Take 1 tablet (10 mg total) by mouth daily. 01/05/20   Kallie Locks, FNP  carvedilol (COREG) 12.5 MG tablet Take 1 tablet (12.5 mg total) by mouth 2 (two) times daily with a meal. 08/21/20   Kallie Locks, FNP  cetirizine (ZYRTEC ALLERGY) 10 MG tablet Take 1 tablet (10 mg total) by mouth daily. 09/19/20   Kallie Locks, FNP  furosemide (LASIX) 40 MG tablet Take 80 mg  ( 2 tablets of 40 mg) every other day with 40 mg (1 tablet) the opposite day 09/14/20   [name], MD  glipiZIDE (GLUCOTROL) 10 MG tablet Take 1 tablet (10 mg total) by mouth 2 (two) times daily before a meal. 09/17/20   Kallie Locks, FNP  hydrocortisone cream 1 % Apply 1 application topically 2 (two) times daily. 09/19/20   Kallie Locks, FNP  hydrOXYzine (ATARAX/VISTARIL) 10 MG tablet Take 1 tablet (10 mg total) by mouth 3 (three) times daily as needed. 09/19/20   Kallie Locks, FNP  losartan (COZAAR) 50 MG tablet Take 1 tablet (50 mg total) by mouth 2 (two) times daily. 09/14/20 12/13/20  [name], MD  polyethylene glycol powder [facility]) 17 GM/SCOOP powder Take 17 g by mouth daily. 08/29/20   Kallie Locks, FNP  potassium chloride SA (KLOR-CON) 20 MEQ tablet Take 1 tablet (20 mEq total) by mouth daily. 09/05/20   [name], PA-C  rivaroxaban (XARELTO) 20 MG TABS tablet Take 1 tablet (20 mg total) by mouth daily with supper. 09/26/20   Inez Catalina, MD  rivaroxaban (XARELTO) 20 MG TABS tablet TAKE 1 TABLET (20 MG TOTAL) BY MOUTH DAILY WITH SUPPER. 09/26/20 09/26/21  Inez Catalina, MD  sitaGLIPtin (JANUVIA) 25 MG tablet Take 1 tablet (25 mg total) by mouth daily. 09/17/20   Kallie Locks, FNP  spironolactone (ALDACTONE) 25 MG tablet Take 0.5 tablets (12.5 mg total)  by mouth daily. 09/27/20 12/26/20  Belva Agee, MD  spironolactone (ALDACTONE) 25 MG tablet TAKE 1/2 TABLET (12.5 MG TOTAL) BY MOUTH DAILY. 09/27/20 09/27/21  Belva Agee, MD  Vitamin D, Ergocalciferol, (DRISDOL) 1.25 MG (50000 UNIT) CAPS capsule Take 1 capsule (50,000 Units total) by mouth every 7 (seven) days. Patient taking differently: Take 50,000 Units by mouth every 7 (seven) days. Mondays 01/05/20   Kallie Locks, FNP  zinc sulfate 220 (50 Zn) MG capsule Take 1 capsule (220 mg total) by mouth daily. 07/28/20   Standley Brooking, MD    Family History    Family History  Problem Relation Age of Onset  . Multiple sclerosis Mother   . Diabetic kidney disease Mother   . Hypertension Mother    He indicated that his mother is alive. He indicated that his father is alive. He indicated that his maternal grandmother is deceased. He indicated that his maternal grandfather is deceased. He indicated that his paternal grandmother is deceased. He indicated that his paternal  grandfather is deceased.  Social History    Social History   Socioeconomic History  . Marital status: Divorced    Spouse name: Not on file  . Number of children: 3  . Years of education: Not on file  . 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 Smoking status: Never Smoker  . Smokeless tobacco: Never Used  Vaping Use  . Vaping Use: Never used  Substance and Sexual Activity  . Alcohol use: No  . Drug use: No  . Sexual activity: Yes  Other Topics Concern  . Not on file  Social History Narrative  . Not on file   Social Determinants of Health   Financial Resource Strain: High Risk  . Difficulty of Paying Living Expenses: Hard  Food Insecurity: No Food Insecurity  . Worried About Programme researcher, broadcasting/film/video in the Last Year: Never true  . Ran Out of Food in the Last Year: Never true  Transportation Needs: No Transportation Needs  .  Lack of Transportation (Medical): No  . Lack of Transportation (Non-Medical): No  Physical Activity: Inactive  . Days of Exercise per Week: 0 days  . Minutes of Exercise per Session: 0 Kane  Stress: Stress Concern Present  . Feeling of Stress : To some extent  Social Connections: Not on file  Intimate Partner Violence: Not on file     Review of Systems    General:  No chills, fever, night sweats or weight changes.  Cardiovascular:  No chest pain, dyspnea on exertion, edema, orthopnea, palpitations, paroxysmal nocturnal dyspnea. Dermatological: No rash, lesions/masses Respiratory: No cough, dyspnea Urologic: No hematuria, dysuria Abdominal:   No nausea, vomiting, diarrhea, bright red blood per rectum, melena, or hematemesis Neurologic:  No visual changes, wkns, changes in mental status. All other systems reviewed and are otherwise negative except as noted above.  Physical Exam    VS:  BP 116/86   Pulse (!) 105   Ht 6\' 1"  (1.854 m)   Wt 284 lb 6.4 oz (129 kg)   SpO2 99%   BMI 37.52 kg/m  , BMI Body mass index is 37.52 kg/m. GEN: Well nourished, well developed, in no acute distress. HEENT: normal. Neck: Supple, no JVD, carotid bruits, or masses. Cardiac: RRR, no murmurs, rubs, or gallops. No clubbing, cyanosis, bilateral lower extremity generalized nonpitting edema.  Radials/DP/PT 2+ and equal bilaterally.  Respiratory:  Respirations regular and unlabored, clear to auscultation bilaterally. GI: Soft, nontender, nondistended, BS + x 4. MS: no deformity or atrophy. Skin: warm and dry, no rash. Neuro:  Strength and sensation are intact. Psych: Normal affect.  Accessory Clinical Findings    Recent Labs: 07/19/2020: TSH 1.447 07/22/2020: Magnesium 2.0 09/26/2020: B Natriuretic Peptide 1,497.8 09/27/2020: ALT 41; BUN 18; Creatinine, Ser 1.29; Hemoglobin 13.8; Platelets 221; Potassium 4.1; Sodium 136   Recent Lipid Panel    Component Value Date/Time   CHOL 87 09/26/2020 0327    TRIG 40 09/26/2020 0327   HDL 29 (L) 09/26/2020 0327   CHOLHDL 3.0 09/26/2020 0327   VLDL 8 09/26/2020 0327   LDLCALC 50 09/26/2020 0327    ECG personally reviewed by me today-none today.  Echocardiogram 09/25/2020 IMPRESSIONS     1. 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 The apical thrombus  was confirmed with Definity contrast.      . Left ventricular ejection fraction, by estimation, is <20%. The left  ventricle has severely decreased function. The left ventricle demonstrates  regional wall motion abnormalities (see scoring diagram/findings for  description). Left ventricular  diastolic parameters are consistent with Grade III diastolic dysfunction  (restrictive). There is severe akinesis of the left ventricular,  mid-apical lateral wall, anteroseptal wall and apical segment.   2. The mitral valve is normal in structure. Trivial mitral valve  regurgitation. No evidence of mitral stenosis.   3. The aortic valve is normal in structure. Aortic valve regurgitation is  not visualized. No aortic stenosis is present.   4. There is mildly elevated pulmonary artery systolic pressure.  Assessment & Plan   1.  LV thrombus-reports compliance with Xarelto.  Denies bleeding issues.  Denies shortness of breath. Continue Xarelto Heart healthy low-sodium diet-salty 6 given Increase physical activity as tolerated  Combined systolic and diastolic/NICM CHF-no increased activity intolerance or DOE.  Remains physically active. Continue carvedilol, furosemide, losartan, potassium, Januvia, spironolactone Heart healthy low-sodium diet-salty 6 given Increase physical activity as tolerated Daily weights-contact office with a weight increase of 3 pounds overnight or 5 pounds in 1 week.  lower extremity support stocking sheet given Elevate lower extremities when not Increase losartan  BMP in 1 week  Hyperlipidemia-09/26/2020: Cholesterol 87; HDL 29; LDL Cholesterol 50;  Triglycerides 40; VLDL 8 Continue atorvastatin Heart healthy low-sodium high-fiber diet Increase physical activity as tolerated   Essential hypertension-BP today 116/86.  Reports well-controlled at home. Continue carvedilol, furosemide, losartan, spironolactone Heart healthy low-sodium diet-salty 6 given Increase physical activity as tolerated  Diabetes mellitus-A1c 7.9 on 09/25/2020. Continue Januvia Heart healthy low-sodium carb modified diet Follows with PCP  Disposition: Follow-up with Dr. Herbie Baltimore 1 months.   Thomasene Ripple. Chelisa Hennen NP-C    12/04/2020, 4:10 PM [facility] [address] Office [phone] Fax [phone]  Notice: This dictation was prepared with Dragon dictation along with smaller phrase technology. Any transcriptional errors that result from this process are unintentional and may not be corrected upon review.  I spent 15 minutes examining this patient, reviewing medications, and using patient centered shared decision making involving her cardiac care.  Prior to her visit I spent greater than 20 minutes reviewing her past medical history,  medications, and prior cardiac tests.

## 2020-12-04 ENCOUNTER — Encounter: Payer: Self-pay | Admitting: General Practice

## 2020-12-04 ENCOUNTER — Other Ambulatory Visit: Payer: Self-pay

## 2020-12-04 ENCOUNTER — Ambulatory Visit (INDEPENDENT_AMBULATORY_CARE_PROVIDER_SITE_OTHER): Payer: BC Managed Care – PPO | Admitting: General Practice

## 2020-12-04 VITALS — BP 116/86 | HR 105 | Ht 73.0 in | Wt 284.4 lb

## 2020-12-04 DIAGNOSIS — E7800 Pure hypercholesterolemia, unspecified: Secondary | ICD-10-CM | POA: Diagnosis not present

## 2020-12-04 DIAGNOSIS — I513 Intracardiac thrombosis, not elsewhere classified: Secondary | ICD-10-CM

## 2020-12-04 DIAGNOSIS — Z79899 Other long term (current) drug therapy: Secondary | ICD-10-CM

## 2020-12-04 DIAGNOSIS — I428 Other cardiomyopathies: Secondary | ICD-10-CM

## 2020-12-04 DIAGNOSIS — I10 Essential (primary) hypertension: Secondary | ICD-10-CM

## 2020-12-04 MED ORDER — LOSARTAN POTASSIUM 50 MG PO TABS: 75.0000 mg | ORAL_TABLET | Freq: Two times a day (BID) | ORAL | 3 refills | Status: DC

## 2020-12-04 NOTE — Patient Instructions (Signed)
Medication Instructions:  INCREASE LOSARTAN 75MG  TWICE DAILY *If you need a refill on your cardiac medications before your next appointment, please call your pharmacy*  Lab Work: BMET IN 1 WEEK If you have labs (blood work) drawn today and your tests are completely normal, you will receive your results only by:  MyChart Message (if you have MyChart) OR A paper copy in the mail.  If you have any lab test that is abnormal or we need to change your treatment, we will call you to review the results. You may go to any [facility] that is convenient for you however, we do have a lab in our office that is able to assist you. You DO NOT need an appointment for our lab. The lab is open 8:00am and closes at 4:00pm. Lunch 12:45 - 1:45pm.  Special Instructions TAKE AND LOG YOUR WEIGHT DAILY  PLEASE READ AND FOLLOW SALTY 6-ATTACHED-1,800 mg daily  PLEASE PURCHASE AND WEAR COMPRESSION STOCKINGS DAILY AND TAKE OFF AT BEDTIME. Compression stockings are elastic socks that squeeze the legs. They help to increase blood flow to the legs and to decrease swelling in the legs from fluid retention, and reduce the chance of developing blood clots in the lower legs. Please put on in the AM when dressing and off at night when dressing for bed.  LET THEM KNOW THAT YOU NEED KNEE HIGH'S WITH COMPRESSION OF 15-20 mmhg.  [employer]  [address] ([phone]); [place], [place]; [phone]  EMAIL   [email or web address].  PLEASE MAKE SURE TO ELEVATE YOUR FEET & LEGS WHILE SITTING, THIS WILL HELP WITH THE SWELLING ALSO.   Follow-Up: Your next appointment:  1 month(s) In Person with [phone], MD OR IF UNAVAILABLE [name], FNP-C  At [facility], you and your health needs are our priority.  As part of our continuing mission to provide you with exceptional heart care, we have created designated Provider Care Teams.  These Care Teams include your primary Cardiologist (physician) and  Advanced Practice Providers (APPs -  Physician Assistants and Nurse Practitioners) who all work together to provide you with the care you need, when you need it.  We recommend signing up for the patient portal called "MyChart".  Sign up information is provided on this After Visit Summary.  MyChart is used to connect with patients for Virtual Visits (Telemedicine).  Patients are able to view lab/test results, encounter notes, upcoming appointments, etc.  Non-urgent messages can be sent to your provider as well.   To learn more about what you can do with MyChart, go to [facility].

## 2020-12-05 ENCOUNTER — Encounter: Payer: BC Managed Care – PPO | Admitting: Speech Pathology

## 2020-12-21 ENCOUNTER — Ambulatory Visit: Payer: Self-pay | Admitting: Nurse Practitioner

## 2020-12-21 ENCOUNTER — Ambulatory Visit: Payer: Self-pay | Admitting: Family Medicine

## 2021-01-07 NOTE — Progress Notes (Deleted)
Cardiology Clinic Note   Patient Name: Rodney Kane Date of Encounter: 01/07/2021  Primary Care Provider:  No primary care provider on file. 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  Past Medical History    Past Medical History:  Diagnosis Date   Coronavirus infection 06/2020   COVID 06/2020   Diabetes mellitus without complication (HCC)    Hemoglobin A1C greater than 9%, indicating poor diabetic control 12/2019   Hyperglycemia 12/2019   Hyperlipidemia 12/2019   Hypertension    Hypertensive urgency 12/2019   Noncompliance with medication regimen 12/2019   Nonischemic (hypertensive) dilated cardiomyopathy (HCC) 12/2016   EF has been 30 to 35%, recently lower.   Stroke [facility])    Urine ketones 12/2019   Vitamin D deficiency 12/2019   Past Surgical History:  Procedure Laterality Date   left knee surgery     RIGHT/LEFT HEART CATH AND CORONARY ANGIOGRAPHY N/A 01/05/2017   Procedure: Right/Left Heart Cath and Coronary Angiography;  Surgeon: [name], MD;  Location: [facility] INVASIVE CV LAB;  Service: Cardiovascular; angiograph normal coronaries.  PAP mean 19 million mercury, PCWP 15 mmHg.  LVEDP 9 mmHg.  Cardiac output/index 7.84/2.7.  RAP mean 4 mmHg.   TRANSTHORACIC ECHOCARDIOGRAM  01/04/2017   EF 30 to 35%.  Moderate LVH.  Moderate severely reduced EF.  Diffuse HK.  Normal valves.   TRANSTHORACIC ECHOCARDIOGRAM  07/20/2020    Severely reduced LV function-EF 20 to 25%, global HK.  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  Mildly reduced cardiac function.  Unable to assess PA pressures.  Moderate LA dilation.  Small pericardial effusion.  Relatively normal mitral valve and tricuspid valve.  Normal RA pressures.    Allergies  No Known Allergies  History of  Present Illness    Rodney Kane has a PMH of diabetes, HTN, nonischemic cardiomyopathy-diagnosed 2018 in the setting of uncontrolled hypertension, preceded by URI symptoms, normal coronary arteries by cardiac catheterization, he was lost to cardiology follow-up and was not seen again until 12/21.  He was admitted for COVID-19.  He was noted to have CVA, LV thrombus, and had further decreased LVEF to 20-25%.  This was in the setting of noncompliance with his medications.  Medical management was recommended.  He was also noted to have some outpatient noncompliance issues.  He left AMA from his primary care office with severely elevated blood pressure.  He was readmitted 09/25/2020 with recurrent CVA in the context of subtherapeutic INR.   Cardiology was consulted for LV thrombus and continued severe LV dysfunction.  His echocardiogram 09/25/2020 showed LVEF less than 20%, G3 DD.  He was discharged on 09/27/2020 on Xarelto, losartan, carvedilol, and spironolactone.   He presented to the clinic 12/04/20 for follow-up evaluation stated over the last few weeks he had noticed some increased lower extremity swelling.  He presented to the clinic with his daughter.  We reviewed his diet and he reported that he ate fresh and processed food.  We discussed low-sodium food options and the importance of not adding salt to his meals.  He reported compliance with his Xarelto and we reviewed his LV thrombus.  I  gave him the [place] support stocking sheet.  I  titrated his losartan, ordered a BMP , gave him salty 6 diet sheet, had him continue to monitor  his weights and planned follow-up in 1 month.   He presents the clinic today for follow-up evaluation states***  Today he denies chest pain, shortness of breath, lower extremity edema, fatigue, palpitations, melena, hematuria, hemoptysis, diaphoresis, weakness, presyncope, syncope, orthopnea, and PND.  Home Medications    Prior to Admission medications   Medication Sig Start  Date End Date Taking? Authorizing Provider  albuterol (VENTOLIN HFA) 108 (90 Base) MCG/ACT inhaler Inhale 2 puffs into the lungs every 6 (six) hours as needed for wheezing or shortness of breath. 09/19/20   Kallie Locks, FNP  atorvastatin (LIPITOR) 10 MG tablet Take 1 tablet (10 mg total) by mouth daily. 01/05/20   Kallie Locks, FNP  carvedilol (COREG) 12.5 MG tablet Take 1 tablet (12.5 mg total) by mouth 2 (two) times daily with a meal. 08/21/20   Kallie Locks, FNP  cetirizine (ZYRTEC ALLERGY) 10 MG tablet Take 1 tablet (10 mg total) by mouth daily. 09/19/20   Kallie Locks, FNP  furosemide (LASIX) 40 MG tablet Take 80 mg  ( 2 tablets of 40 mg) every other day with 40 mg (1 tablet) the opposite day 09/14/20   [name], MD  glipiZIDE (GLUCOTROL) 10 MG tablet Take 1 tablet (10 mg total) by mouth 2 (two) times daily before a meal. 09/17/20   Kallie Locks, FNP  hydrocortisone cream 1 % Apply 1 application topically 2 (two) times daily. 09/19/20   Kallie Locks, FNP  hydrOXYzine (ATARAX/VISTARIL) 10 MG tablet Take 1 tablet (10 mg total) by mouth 3 (three) times daily as needed. 09/19/20   Kallie Locks, FNP  losartan (COZAAR) 50 MG tablet Take 1.5 tablets (75 mg total) by mouth 2 (two) times daily. 12/04/20 03/04/21  Ronney Asters, NP  polyethylene glycol powder (GLYCOLAX/MIRALAX) 17 GM/SCOOP powder Take 17 g by mouth daily. 08/29/20   Kallie Locks, FNP  potassium chloride SA (KLOR-CON) 20 MEQ tablet Take 1 tablet (20 mEq total) by mouth daily. 09/05/20   [name], PA-C  rivaroxaban (XARELTO) 20 MG TABS tablet TAKE 1 TABLET (20 MG TOTAL) BY MOUTH DAILY WITH SUPPER. 09/26/20 09/26/21  Inez Catalina, MD  sitaGLIPtin (JANUVIA) 25 MG tablet Take 1 tablet (25 mg total) by mouth daily. 09/17/20   Kallie Locks, FNP  spironolactone (ALDACTONE) 25 MG tablet TAKE 1/2 TABLET (12.5 MG TOTAL) BY MOUTH DAILY. 09/27/20 09/27/21  Belva Agee, MD  Vitamin D, Ergocalciferol,  (DRISDOL) 1.25 MG (50000 UNIT) CAPS capsule Take 1 capsule (50,000 Units total) by mouth every 7 (seven) days. 01/05/20   Kallie Locks, FNP    Family History    Family History  Problem Relation Age of Onset   Multiple sclerosis Mother    Diabetic kidney disease Mother    Hypertension Mother    He indicated that his mother is alive. He indicated that his father is alive. He indicated that his maternal grandmother is deceased. He indicated that his maternal grandfather is deceased. He indicated that his paternal grandmother is deceased. He indicated that his paternal grandfather is deceased.  Social History    Social History   Socioeconomic History   Marital status: Divorced    Spouse name: Not on file   Number of children: 3   Years of education: Not on file   Highest education level: Not on file  Occupational History    Employer: [employer]  Tobacco Use   Smoking status: Never  Smokeless tobacco: Never  Vaping Use   Vaping Use: Never used  Substance and Sexual Activity   Alcohol use: No   Drug use: No   Sexual activity: Yes  Other Topics Concern   Not on file  Social History Narrative   Not on file   Social Determinants of Health   Financial Resource Strain: High Risk   Difficulty of Paying Living Expenses: Hard  Food Insecurity: No Food Insecurity   Worried About Running Out of Food in the Last Year: Never true   Ran Out of Food in the Last Year: Never true  Transportation Needs: No Transportation Needs   Lack of Transportation (Medical): No   Lack of Transportation (Non-Medical): No  Physical Activity: Inactive   Days of Exercise per Week: 0 days   Minutes of Exercise per Session: 0 min  Stress: Stress Concern Present   Feeling of Stress : To some extent  Social Connections: Not on file  Intimate Partner Violence: Not on file     Review of Systems    General:  No chills, fever, night sweats or weight changes.  Cardiovascular:  No chest  pain, dyspnea on exertion, edema, orthopnea, palpitations, paroxysmal nocturnal dyspnea. Dermatological: No rash, lesions/masses Respiratory: No cough, dyspnea Urologic: No hematuria, dysuria Abdominal:   No nausea, vomiting, diarrhea, bright red blood per rectum, melena, or hematemesis Neurologic:  No visual changes, wkns, changes in mental status. All other systems reviewed and are otherwise negative except as noted above.  Physical Exam    VS:  There were no vitals taken for this visit. , BMI There is no height or weight on file to calculate BMI. GEN: Well nourished, well developed, in no acute distress. HEENT: normal. Neck: Supple, no JVD, carotid bruits, or masses. Cardiac: RRR, no murmurs, rubs, or gallops. No clubbing, cyanosis, edema.  Radials/DP/PT 2+ and equal bilaterally.  Respiratory:  Respirations regular and unlabored, clear to auscultation bilaterally. GI: Soft, nontender, nondistended, BS + x 4. MS: no deformity or atrophy. Skin: warm and dry, no rash. Neuro:  Strength and sensation are intact. Psych: Normal affect.  Accessory Clinical Findings    Recent Labs: 07/19/2020: TSH 1.447 07/22/2020: Magnesium 2.0 09/26/2020: B Natriuretic Peptide 1,497.8 09/27/2020: ALT 41; BUN 18; Creatinine, Ser 1.29; Hemoglobin 13.8; Platelets 221; Potassium 4.1; Sodium 136   Recent Lipid Panel    Component Value Date/Time   CHOL 87 09/26/2020 0327   TRIG 40 09/26/2020 0327   HDL 29 (L) 09/26/2020 0327   CHOLHDL 3.0 09/26/2020 0327   VLDL 8 09/26/2020 0327   LDLCALC 50 09/26/2020 0327    ECG personally reviewed by me today- *** - No acute changes  Echocardiogram 09/25/2020  IMPRESSIONS     1. 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 The apical thrombus  was confirmed with Definity contrast.      . Left ventricular ejection fraction, by estimation, is <20%. The left  ventricle has severely decreased function. The left ventricle demonstrates  regional wall motion  abnormalities (see scoring diagram/findings for  description). Left ventricular  diastolic parameters are consistent with Grade III diastolic dysfunction  (restrictive). There is severe akinesis of the left ventricular,  mid-apical lateral wall, anteroseptal wall and apical segment.   2. The mitral valve is normal in structure. Trivial mitral valve  regurgitation. No evidence of mitral stenosis.   3. The aortic valve is normal in structure. Aortic valve regurgitation is  not visualized. No aortic stenosis is  present.   4. There is mildly elevated pulmonary artery systolic pressure  Assessment & Plan   1.  Combined systolic and diastolic/NICM CHF-no increased activity intolerance or DOE.  Remains physically active. Continue carvedilol, furosemide, losartan, potassium, Januvia, spironolactone Heart healthy low-sodium diet-salty 6 given Increase physical activity as tolerated Daily weights-contact office with a weight increase of 3 pounds overnight or 5 pounds in 1 week. [place] lower extremity support stocking sheet given Elevate lower extremities when not Order BMP  Repeat echocardiogram in 1 month.  Hyperlipidemia-09/26/2020: Cholesterol 87; HDL 29; LDL Cholesterol 50; Triglycerides 40; VLDL 8 Continue atorvastatin Heart healthy low-sodium high-fiber diet Increase physical activity as tolerated     Essential hypertension-BP today ***116/86.  Reports well-controlled at home. 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  Denies bleeding issues.  Denies shortness of breath. Continue Xarelto Heart healthy low-sodium diet-salty 6 given Increase physical activity as tolerated  Diabetes mellitus-A1c 7.9 on 09/25/2020. Continue Januvia Heart healthy low-sodium carb modified diet Follows with PCP   Disposition: Follow-up with Dr. Herbie Baltimore or me  in 3 months.   Thomasene Ripple. Anayeli Arel NP-C    01/07/2021, 12:40 PM [facility] [address] Office [phone] Fax [phone]  Notice: This dictation was prepared with Dragon dictation along with smaller phrase technology. Any transcriptional errors that result from this process are unintentional and may not be corrected upon review.  I spent***minutes examining this patient, reviewing medications, and using patient centered shared decision making involving her cardiac care.  Prior to her visit I spent greater than 20 minutes reviewing her past medical history,  medications, and prior cardiac tests.

## 2021-01-08 ENCOUNTER — Ambulatory Visit: Payer: BC Managed Care – PPO | Admitting: General Practice

## 2021-01-10 ENCOUNTER — Ambulatory Visit (INDEPENDENT_AMBULATORY_CARE_PROVIDER_SITE_OTHER): Payer: BC Managed Care – PPO | Admitting: Adult Health

## 2021-01-10 ENCOUNTER — Encounter: Payer: Self-pay | Admitting: Adult Health

## 2021-01-10 VITALS — BP 138/98 | HR 101 | Ht 73.0 in | Wt 291.0 lb

## 2021-01-10 DIAGNOSIS — E1142 Type 2 diabetes mellitus with diabetic polyneuropathy: Secondary | ICD-10-CM

## 2021-01-10 DIAGNOSIS — E785 Hyperlipidemia, unspecified: Secondary | ICD-10-CM

## 2021-01-10 DIAGNOSIS — R4701 Aphasia: Secondary | ICD-10-CM | POA: Diagnosis not present

## 2021-01-10 DIAGNOSIS — I6340 Cerebral infarction due to embolism of unspecified cerebral artery: Secondary | ICD-10-CM | POA: Diagnosis not present

## 2021-01-10 DIAGNOSIS — I513 Intracardiac thrombosis, not elsewhere classified: Secondary | ICD-10-CM

## 2021-01-10 DIAGNOSIS — Z5181 Encounter for therapeutic drug level monitoring: Secondary | ICD-10-CM | POA: Diagnosis not present

## 2021-01-10 DIAGNOSIS — G5793 Unspecified mononeuropathy of bilateral lower limbs: Secondary | ICD-10-CM

## 2021-01-10 DIAGNOSIS — Z9189 Other specified personal risk factors, not elsewhere classified: Secondary | ICD-10-CM

## 2021-01-10 NOTE — Progress Notes (Signed)
[facility] [address] [place] [place] [address] [phone]       OFFICE FOLLOW UP NOTE    [name] Date of Birth:  [birth year] Medical Record Number:  [id number]   Primary neurologist: Dr. Pearlean Brownie Primary care provider: Pcp, No   Reason for visit: Stroke follow-up  Chief Complaint  Patient presents with   Follow-up    RM 14 alone Pt is well and stable, no new complications      HPI:   Today, 01/10/2021, [name] returns for 20-month stroke follow-up unaccompanied.  He has been stable since prior visit without new stroke/TIA symptoms.  Reports residual word finding difficulty but overall improving.  He is able to maintain all ADLs and majority of IADLs independently. Reports compliance on Xarelto and atorvastatin without associated side effects.  Blood pressure today 138/98.  He has not yet underwent recommended sleep study.  He does not routinely check glucose levels at home.  He does report slowly progressive bilateral lower extremity L>R painful paresthesias which are worse at night and difficulty tolerating even the sheet touching his feet.  He has not had recent lab work.  No further concerns at this time.    History provided for reference purposes only Initial consult visit 10/10/2020 Dr. Pearlean Brownie: 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  Initially presented to [facility] emergency room on 07/19/2020 for evaluation for confusion and altered mental status for several days.  He also had shortness of breath and chest pain and initially negative Covid test and negative chest  CT for pulmonary embolism.  He was found to be aphasic and CT scan of the head was obtained which showed left MCA subacute infarct.  He is however subsequently tested Covid positive during hospitalization.  BNP was elevated at 1600.  2D echo showed diminished ejection fraction of 20% with left ventricular large mural thrombus.  CT angiogram showed no large vessel extracranial intracranial stenosis.  LDL cholesterol 102 mg percent and hemoglobin A1c 7.7.  Patient was started on IV heparin initially and subsequently switched to warfarin with target INR goal of between 2 and 2.5.  His D-dimer is elevated 1.27 and fibrinogen level was 608.  He was treated with remdesivir and steroids.  Patient subsequently made good recovery from his COVID as well as with aphasia but had mild residual expressive aphasia.  He was able to handle his own affairs.  He returned back to the ER on 09/25/2020 with difficulty using his right arm.  He did he describe numbness as well as weakness which happened in the morning when he woke up from sleep.  He was supposed to be on warfarin but admitted to not being very compliant and INR on admission was low at 1.3.  MRI scan of the brain was obtained which showed recent subacute left MCA branch hemorrhagic infarct as well as tiny punctate infarct in the left cerebellum which was acute.  Repeat echocardiogram showed persistent large apical mural thrombus in the left ventricle with ejection fraction less than 20%.  LDL cholesterol this time was satisfactory at 50 and A1c was yet elevated at 7.9.  After discussion with cardiology and pharmacy it was decided to  switch the patient from warfarin to Xarelto because of compliance issues of monitoring INR and patient living alone making it difficult for him to go for blood checks and appointments.  Patient states is done well since discharge.  He had no further recurrent TIA or stroke symptoms and right upper extremity numbness and balance has fully resolved.   He continues to expressive language and speech difficulties with nonfluent speech and word hesitancy.  He is living now with his son.  Is still responsible for his own medications and is pretty much independent in most activities of daily living.  He states his sugars are doing better now he does not check it every day.  He is tolerating Lipitor well without muscle aches and pains.  His blood pressure is good and today it is 102/79.  Patient has not been tested for sleep apnea before.  ROS:   14 system review of systems is positive for those listed in HPI all other systems negative  PMH:  Past Medical History:  Diagnosis Date   Coronavirus infection 06/2020   COVID 06/2020   Diabetes mellitus without complication (HCC)    Hemoglobin A1C greater than 9%, indicating poor diabetic control 12/2019   Hyperglycemia 12/2019   Hyperlipidemia 12/2019   Hypertension    Hypertensive urgency 12/2019   Noncompliance with medication regimen 12/2019   Nonischemic (hypertensive) dilated cardiomyopathy (HCC) 12/2016   EF has been 30 to 35%, recently lower.   Stroke [facility])    Urine ketones 12/2019   Vitamin D deficiency 12/2019    Social History:  Social History   Socioeconomic History   Marital status: Divorced    Spouse name: Not on file   Number of children: 3   Years of education: Not on file   Highest education level: Not on file  Occupational History    Employer: [employer] ([employer])  Tobacco Use   Smoking status: Never   Smokeless tobacco: Never  Vaping Use   Vaping Use: Never used  Substance and Sexual Activity   Alcohol use: No   Drug use: No   Sexual activity: Yes  Other Topics Concern   Not on file  Social History Narrative   Not on file   Social Determinants of Health   Financial Resource Strain: High Risk   Difficulty of Paying Living Expenses: Hard  Food Insecurity: No Food Insecurity   Worried About Running Out of Food in the Last Year: Never true   Ran Out of  Food in the Last Year: Never true  Transportation Needs: No Transportation Needs   Lack of Transportation (Medical): No   Lack of Transportation (Non-Medical): No  Physical Activity: Inactive   Days of Exercise per Week: 0 days   Minutes of Exercise per Session: 0 min  Stress: Stress Concern Present   Feeling of Stress : To some extent  Social Connections: Not on file  Intimate Partner Violence: Not on file    Medications:   Current Outpatient Medications on File Prior to Visit  Medication Sig Dispense Refill   albuterol (VENTOLIN HFA) 108 (90 Base) MCG/ACT inhaler Inhale 2 puffs into the lungs every 6 (six) hours as needed for wheezing or shortness of breath. 8 g 11   atorvastatin (LIPITOR) 10 MG tablet Take 1 tablet (10 mg total) by mouth daily. 90 tablet 3   carvedilol (COREG) 12.5 MG tablet Take 1 tablet (12.5 mg total) by mouth 2 (two) times daily with a meal. 180 tablet 3  cetirizine (ZYRTEC ALLERGY) 10 MG tablet Take 1 tablet (10 mg total) by mouth daily. 30 tablet 12   furosemide (LASIX) 40 MG tablet Take 80 mg  ( 2 tablets of 40 mg) every other day with 40 mg (1 tablet) the opposite day 180 tablet 2   glipiZIDE (GLUCOTROL) 10 MG tablet Take 1 tablet (10 mg total) by mouth 2 (two) times daily before a meal. 180 tablet 3   hydrocortisone cream 1 % Apply 1 application topically 2 (two) times daily. 30 g 6   hydrOXYzine (ATARAX/VISTARIL) 10 MG tablet Take 1 tablet (10 mg total) by mouth 3 (three) times daily as needed. 90 tablet 11   losartan (COZAAR) 50 MG tablet Take 1.5 tablets (75 mg total) by mouth 2 (two) times daily. 60 tablet 3   polyethylene glycol powder (GLYCOLAX/MIRALAX) 17 GM/SCOOP powder Take 17 g by mouth daily. 3350 g 1   potassium chloride SA (KLOR-CON) 20 MEQ tablet Take 1 tablet (20 mEq total) by mouth daily. 90 tablet 3   rivaroxaban (XARELTO) 20 MG TABS tablet TAKE 1 TABLET (20 MG TOTAL) BY MOUTH DAILY WITH SUPPER. 30 tablet 30   sitaGLIPtin (JANUVIA) 25 MG  tablet Take 1 tablet (25 mg total) by mouth daily. 90 tablet 3   spironolactone (ALDACTONE) 25 MG tablet TAKE 1/2 TABLET (12.5 MG TOTAL) BY MOUTH DAILY. 30 tablet 3   Vitamin D, Ergocalciferol, (DRISDOL) 1.25 MG (50000 UNIT) CAPS capsule Take 1 capsule (50,000 Units total) by mouth every 7 (seven) days. 5 capsule 5   No current facility-administered medications on file prior to visit.    Allergies:  No Known Allergies  Physical Exam Today's Vitals   01/10/21 1107  BP: (!) 138/98  Pulse: (!) 101  Weight: 291 lb (132 kg)  Height: 6\' 1"  (1.854 m)   Body mass index is 38.39 kg/m.   General: Obese very pleasant middle-aged African-American male, seated, in no evident distress Head: head normocephalic and atraumatic.   Neck: supple with no carotid or supraclavicular bruits Cardiovascular: regular rate and rhythm, no murmurs Musculoskeletal: no deformity Skin:  no rash/petichiae .prominent keloid on the right cheek Vascular:  Normal pulses all extremities  Neurologic Exam Mental Status: Awake and fully alert. Oriented to place and time. Recent and remote memory intact. Attention span, concentration and fund of knowledge appropriate. Mood and affect appropriate.  Mild expressive aphasia with nonfluent speech and word finding difficulties and hesitancy.  No paraphasic errors.  Able to name and repeat without difficulty Cranial Nerves: Pupils equal, briskly reactive to light. Extraocular movements full without nystagmus. Visual fields full to confrontation. Hearing intact. Facial sensation intact.  Right lower facial asymmetry., tongue, palate moves normally and symmetrically.  Motor: Normal bulk and tone. Normal strength in all tested extremity muscles.  Sensory.: intact to touch , pinprick , position and vibratory sensation BUE.  Decreased light touch and vibratory sensation bilateral lower extremities distally Coordination: Rapid alternating movements normal in all extremities.  Finger-to-nose and heel-to-shin performed accurately bilaterally. Gait and Station: Arises from chair without difficulty. Stance is normal. Gait demonstrates normal stride length and balance . Able to heel, toe and tandem walk with mild difficulty.  Reflexes: 1+ and symmetric. Toes downgoing.      ASSESSMENT: 45 year old African-American male with embolic left MCA branch infarct in December 2021 in the setting of acute Covid infection as well as small left cerebellar embolic infarct in March 2022 secondary to left ventricular mural thrombus and noncompliance with warfarin.  Vascular risk factors of left ventricular clot, cardiomyopathy, combined systolic and diastolic CHF, obesity, hypertension, hyperlipidemia, diabetes and at risk for sleep apnea     PLAN:  1.  Left MCA stroke -Continue Xarelto and atorvastatin 10 mg daily for secondary stroke prevention -Discussed secondary stroke prevention measures and importance of close PCP follow-up for aggressive stroke risk factor management  2.  HTN -BP goal<130/90 -Stable on current regimen  3.  HLD -LDL goal<70 -Prior LDL 50 11/275 on atorvastatin 10 mg daily -Repeat lipid panel today  4.  DM -A1c goal<7 -prior A1c 7.9 09/2020 on glipizide and sitagliptin per PCP -Questional start of diabetic neuropathy -recheck A1c today as well as B12 and TSH rule out other causes.  Referral placed to podiatry to establish care as well as evaluation of possible left foot plantar cyst.  Trial gabapentin 300 mg 3 times daily.  May consider EMG/NCV in the future if symptoms persist  5.  At risk for sleep apnea -[facility] sleep clinic unable to contact patient to schedule initial evaluation - will reach out to referrals to assist with scheduling patient  6.  LV mural thrombus -Continue Xarelto managed and monitored by cardiology and continue routine follow-up     Follow-up in 4 months or call earlier if needed  CC:  [facility] provider: Dr. Pearlean Brownie Pcp, No     I spent 43 minutes of face-to-face and non-face-to-face time with patient.  This included previsit chart review, lab review, study review, order entry, electronic health record documentation, and patient education and discussion regarding history of prior stroke with residual deficits as well as secondary stroke prevention education and importance of managing stroke risk factors, importance of sleep apnea testing and possible new onset of diabetic neuropathy with further evaluation and treatment options and answered all other questions to patient satisfaction  Ihor Austin, AGNP-BC  [facility] [address] [place], [place] [address]  Phone [phone] Fax [phone] Note: This document was prepared with digital dictation and possible smart phrase technology. Any transcriptional errors that result from this process are unintentional.

## 2021-01-10 NOTE — Patient Instructions (Addendum)
We will check your cholesterol levels and A1c today as well as comprehensive metabolic panel, vitamin B12 level and thyroid level  Referral placed to podiatry for further evaluation of feet pain  Trial gabapentin 300 mg 3 times daily for nerve pain -would recommend starting just 1 capsule at nighttime and then slowly increase if needed  You will be called to schedule a sleep evaluation for concern of possible sleep apnea -if you do not hear from them by end of next week, please call our office back to schedule  Continue Xarelto (rivaroxaban) daily  and atorvastatin for secondary stroke prevention  Continue to follow up with PCP regarding cholesterol, blood pressure and diabetes management  Maintain strict control of hypertension with blood pressure goal below 130/90, diabetes with hemoglobin A1c goal below 7 % and cholesterol with LDL cholesterol (bad cholesterol) goal below 70 mg/dL.      Followup in the future with me in 4 months or call earlier if needed       Thank you for coming to see [place] at [facility]. I hope we have been able to provide you high quality care today.  You may receive a patient satisfaction survey over the next few weeks. We would appreciate your feedback and comments so that we may continue to improve ourselves and the health of our patients.

## 2021-01-11 LAB — LIPID PANEL
Chol/HDL Ratio: 2.9 ratio (ref 0.0–5.0)
Cholesterol, Total: 163 mg/dL (ref 100–199)
HDL: 57 mg/dL (ref >39)
LDL Chol Calc (NIH): 94 mg/dL (ref 0–99)
Triglycerides: 63 mg/dL (ref 0–149)
VLDL Cholesterol Cal: 12 mg/dL (ref 5–40)

## 2021-01-11 LAB — VITAMIN B12: Vitamin B-12: 384 pg/mL (ref 232–1245)

## 2021-01-11 LAB — COMPREHENSIVE METABOLIC PANEL
ALT: 14 IU/L (ref 0–44)
AST: 13 IU/L (ref 0–40)
Albumin/Globulin Ratio: 1.7 (ref 1.2–2.2)
Albumin: 4.2 g/dL (ref 4.0–5.0)
Alkaline Phosphatase: 67 IU/L (ref 44–121)
BUN/Creatinine Ratio: 10 (ref 9–20)
BUN: 12 mg/dL (ref 6–24)
Bilirubin Total: 0.8 mg/dL (ref 0.0–1.2)
CO2: 25 mmol/L (ref 20–29)
Calcium: 9.7 mg/dL (ref 8.7–10.2)
Chloride: 102 mmol/L (ref 96–106)
Creatinine, Ser: 1.19 mg/dL (ref 0.76–1.27)
Globulin, Total: 2.5 g/dL (ref 1.5–4.5)
Glucose: 93 mg/dL (ref 65–99)
Potassium: 5.4 mmol/L — ABNORMAL HIGH (ref 3.5–5.2)
Sodium: 143 mmol/L (ref 134–144)
Total Protein: 6.7 g/dL (ref 6.0–8.5)
eGFR: 77 mL/min/{1.73_m2} (ref >59)

## 2021-01-11 LAB — TSH: TSH: 1.52 u[IU]/mL (ref 0.450–4.500)

## 2021-01-11 LAB — HEMOGLOBIN A1C
Est. average glucose Bld gHb Est-mCnc: 160 mg/dL
Hgb A1c MFr Bld: 7.2 % — ABNORMAL HIGH (ref 4.8–5.6)

## 2021-01-14 ENCOUNTER — Telehealth: Payer: Self-pay

## 2021-01-14 ENCOUNTER — Other Ambulatory Visit: Payer: Self-pay | Admitting: Adult Health

## 2021-01-14 DIAGNOSIS — E118 Type 2 diabetes mellitus with unspecified complications: Secondary | ICD-10-CM

## 2021-01-14 DIAGNOSIS — E785 Hyperlipidemia, unspecified: Secondary | ICD-10-CM

## 2021-01-14 MED ORDER — ATORVASTATIN CALCIUM 40 MG PO TABS: 40.0000 mg | ORAL_TABLET | Freq: Every day | ORAL | 3 refills | Status: DC

## 2021-01-14 NOTE — Telephone Encounter (Signed)
Contact pt to inform him that recent lipid panel showed elevated LDL or bad cholesterol at 94 with goal of less than 70 -currently on atorvastatin 10 mg daily and recommend increasing to 40 mg daily -a new order will be placed.  A1c 7.2 which shows improvement from prior levels  B12 level within normal limits but on the lower side at 384 -would recommend starting vitamin D supplement 1000 mcg daily which can be purchasedover-the-counter. Advised t call the office with questions as he had none at the time, he understood,

## 2021-01-14 NOTE — Progress Notes (Signed)
See telephone note 01/14/21

## 2021-01-18 NOTE — Progress Notes (Signed)
I agree with the above plan 

## 2021-01-30 ENCOUNTER — Ambulatory Visit: Payer: BC Managed Care – PPO | Admitting: Podiatry

## 2021-02-02 ENCOUNTER — Encounter (HOSPITAL_COMMUNITY): Payer: Self-pay | Admitting: Emergency Medicine

## 2021-02-02 ENCOUNTER — Emergency Department (HOSPITAL_COMMUNITY): Payer: BC Managed Care – PPO

## 2021-02-02 ENCOUNTER — Emergency Department (HOSPITAL_COMMUNITY)
Admission: EM | Admit: 2021-02-02 | Discharge: 2021-02-02 | Disposition: A | Discharge status: Home / Self Care | Payer: BC Managed Care – PPO | Attending: Emergency Medicine | Admitting: Emergency Medicine

## 2021-02-02 ENCOUNTER — Other Ambulatory Visit: Payer: Self-pay

## 2021-02-02 DIAGNOSIS — Z7901 Long term (current) use of anticoagulants: Secondary | ICD-10-CM | POA: Insufficient documentation

## 2021-02-02 DIAGNOSIS — Z8616 Personal history of COVID-19: Secondary | ICD-10-CM | POA: Insufficient documentation

## 2021-02-02 DIAGNOSIS — Z955 Presence of coronary angioplasty implant and graft: Secondary | ICD-10-CM | POA: Insufficient documentation

## 2021-02-02 DIAGNOSIS — R059 Cough, unspecified: Secondary | ICD-10-CM | POA: Insufficient documentation

## 2021-02-02 DIAGNOSIS — E119 Type 2 diabetes mellitus without complications: Secondary | ICD-10-CM | POA: Diagnosis not present

## 2021-02-02 DIAGNOSIS — I130 Hypertensive heart and chronic kidney disease with heart failure and stage 1 through stage 4 chronic kidney disease, or unspecified chronic kidney disease: Secondary | ICD-10-CM | POA: Insufficient documentation

## 2021-02-02 DIAGNOSIS — R600 Localized edema: Secondary | ICD-10-CM | POA: Insufficient documentation

## 2021-02-02 DIAGNOSIS — N182 Chronic kidney disease, stage 2 (mild): Secondary | ICD-10-CM | POA: Insufficient documentation

## 2021-02-02 DIAGNOSIS — I4891 Unspecified atrial fibrillation: Secondary | ICD-10-CM | POA: Diagnosis not present

## 2021-02-02 DIAGNOSIS — I5041 Acute combined systolic (congestive) and diastolic (congestive) heart failure: Secondary | ICD-10-CM | POA: Diagnosis not present

## 2021-02-02 DIAGNOSIS — I5043 Acute on chronic combined systolic (congestive) and diastolic (congestive) heart failure: Secondary | ICD-10-CM | POA: Diagnosis not present

## 2021-02-02 DIAGNOSIS — Z20822 Contact with and (suspected) exposure to covid-19: Secondary | ICD-10-CM | POA: Insufficient documentation

## 2021-02-02 DIAGNOSIS — R0602 Shortness of breath: Secondary | ICD-10-CM | POA: Diagnosis not present

## 2021-02-02 DIAGNOSIS — Z7984 Long term (current) use of oral hypoglycemic drugs: Secondary | ICD-10-CM | POA: Diagnosis not present

## 2021-02-02 DIAGNOSIS — I10 Essential (primary) hypertension: Secondary | ICD-10-CM

## 2021-02-02 DIAGNOSIS — I110 Hypertensive heart disease with heart failure: Secondary | ICD-10-CM | POA: Diagnosis not present

## 2021-02-02 DIAGNOSIS — Z79899 Other long term (current) drug therapy: Secondary | ICD-10-CM | POA: Insufficient documentation

## 2021-02-02 DIAGNOSIS — I517 Cardiomegaly: Secondary | ICD-10-CM | POA: Diagnosis not present

## 2021-02-02 LAB — CBC
HCT: 48.4 % (ref 39.0–52.0)
Hemoglobin: 15.4 g/dL (ref 13.0–17.0)
MCH: 26.1 pg (ref 26.0–34.0)
MCHC: 31.8 g/dL (ref 30.0–36.0)
MCV: 81.9 fL (ref 80.0–100.0)
Platelets: 204 10*3/uL (ref 150–400)
RBC: 5.91 MIL/uL — ABNORMAL HIGH (ref 4.22–5.81)
RDW: 21 % — ABNORMAL HIGH (ref 11.5–15.5)
WBC: 7.1 10*3/uL (ref 4.0–10.5)
nRBC: 0.3 % — ABNORMAL HIGH (ref 0.0–0.2)

## 2021-02-02 LAB — BASIC METABOLIC PANEL
Anion gap: 10 (ref 5–15)
BUN: 17 mg/dL (ref 6–20)
CO2: 24 mmol/L (ref 22–32)
Calcium: 9.2 mg/dL (ref 8.9–10.3)
Chloride: 104 mmol/L (ref 98–111)
Creatinine, Ser: 1.49 mg/dL — ABNORMAL HIGH (ref 0.61–1.24)
GFR, Estimated: 59 mL/min — ABNORMAL LOW (ref >60)
Glucose, Bld: 135 mg/dL — ABNORMAL HIGH (ref 70–99)
Potassium: 4.6 mmol/L (ref 3.5–5.1)
Sodium: 138 mmol/L (ref 135–145)

## 2021-02-02 LAB — BRAIN NATRIURETIC PEPTIDE: B Natriuretic Peptide: 1620.5 pg/mL — ABNORMAL HIGH (ref 0.0–100.0)

## 2021-02-02 MED ORDER — NITROGLYCERIN 2 % TD OINT
1.0000 [in_us] | TOPICAL_OINTMENT | Freq: Once | TRANSDERMAL | Status: AC
  Administered 2021-02-02: 1 [in_us] via TOPICAL
  Filled 2021-02-02: qty 1

## 2021-02-02 MED ORDER — CARVEDILOL 12.5 MG PO TABS
12.5000 mg | ORAL_TABLET | Freq: Once | ORAL | Status: AC
  Administered 2021-02-02: 12.5 mg via ORAL
  Filled 2021-02-02: qty 1

## 2021-02-02 MED ORDER — HYDRALAZINE HCL 25 MG PO TABS
50.0000 mg | ORAL_TABLET | Freq: Once | ORAL | Status: AC
  Administered 2021-02-02: 50 mg via ORAL
  Filled 2021-02-02: qty 2

## 2021-02-02 MED ORDER — FUROSEMIDE 10 MG/ML IJ SOLN
60.0000 mg | Freq: Once | INTRAMUSCULAR | Status: AC
  Administered 2021-02-02: 60 mg via INTRAVENOUS
  Filled 2021-02-02: qty 6

## 2021-02-02 MED ORDER — LOSARTAN POTASSIUM 50 MG PO TABS
50.0000 mg | ORAL_TABLET | Freq: Once | ORAL | Status: AC
  Administered 2021-02-02: 50 mg via ORAL
  Filled 2021-02-02: qty 1

## 2021-02-02 NOTE — ED Provider Notes (Signed)
[facility] EMERGENCY DEPARTMENT Provider Note   CSN: [id number] Arrival date & time: 02/02/21  9093     History Chief Complaint  Patient presents with   Shortness of Breath   Leg Swelling    Rodney Kane is a 45 y.o. male.  Patieit with hx non ischemic cardiomyopathy, htn, dm, presents with sob in past 1-2 weeks. Symptoms gradual onset, mild-mod, constant, persistent. States take lasix but feels it may not be working. Is urinating normal amounts. States mild increase in bilateral foot, ankle, lower leg edema. No chest pain or discomfort. +non prod cough. No sore throat. No fever or chills. No specific known ill contacts. Indicates compliant w normal home meds, denies recent change.   The history is provided by the patient and a relative.  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   Prolonged QT interval 07/25/2020   Sinus tachycardia 07/20/2020   Acute CVA  (cerebrovascular accident) (HCC) 07/20/2020   COVID-19 virus infection 07/19/2020   Noncompliance with medication regimen 01/04/2020   Hyperglycemia 01/04/2020   Urine ketones 01/04/2020   Essential hypertension 01/04/2020   Nonischemic cardiomyopathy (HCC) 01/16/2017   CKD (chronic kidney disease), 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  PAP mean 19 million mercury, PCWP 15 mmHg.  LVEDP 9 mmHg.  Cardiac output/index 7.84/2.7.  RAP mean 4 mmHg.   TRANSTHORACIC ECHOCARDIOGRAM  01/04/2017   EF 30 to 35%.  Moderate LVH.  Moderate severely reduced EF.  Diffuse HK.  Normal valves.   TRANSTHORACIC ECHOCARDIOGRAM  07/20/2020    Severely reduced LV function-EF 20 to 25%, global HK.  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  Mildly reduced cardiac function.  Unable to assess PA pressures.  Moderate LA dilation.  Small pericardial effusion.  Relatively normal mitral valve and tricuspid valve.  Normal RA pressures.       Family History  Problem Relation Age of Onset   Multiple sclerosis Mother    Diabetic kidney disease Mother  Hypertension Mother     Social History   Tobacco Use   Smoking status: Never   Smokeless tobacco: Never  Vaping Use   Vaping Use: Never used  Substance Use Topics   Alcohol use: No   Drug use: No    Home Medications Prior to Admission medications   Medication Sig Start Date End Date Taking? Authorizing Provider  albuterol (VENTOLIN HFA) 108 (90 Base) MCG/ACT inhaler Inhale 2 puffs  into the lungs every 6 (six) hours as needed for wheezing or shortness of breath. 09/19/20   Kallie Locks, FNP  atorvastatin (LIPITOR) 40 MG tablet Take 1 tablet (40 mg total) by mouth daily. 01/14/21   Ihor Austin, NP  carvedilol (COREG) 12.5 MG tablet Take 1 tablet (12.5 mg total) by mouth 2 (two) times daily with a meal. 08/21/20   Kallie Locks, FNP  cetirizine (ZYRTEC ALLERGY) 10 MG tablet Take 1 tablet (10 mg total) by mouth daily. 09/19/20   Kallie Locks, FNP  furosemide (LASIX) 40 MG tablet Take 80 mg  ( 2 tablets of 40 mg) every other day with 40 mg (1 tablet) the opposite day 09/14/20   [name], MD  glipiZIDE (GLUCOTROL) 10 MG tablet Take 1 tablet (10 mg total) by mouth 2 (two) times daily before a meal. 09/17/20   Kallie Locks, FNP  hydrocortisone cream 1 % Apply 1 application topically 2 (two) times daily. 09/19/20   Kallie Locks, FNP  hydrOXYzine (ATARAX/VISTARIL) 10 MG tablet Take 1 tablet (10 mg total) by mouth 3 (three) times daily as needed. 09/19/20   Kallie Locks, FNP  losartan (COZAAR) 50 MG tablet Take 1.5 tablets (75 mg total) by mouth 2 (two) times daily. 12/04/20 03/04/21  Ronney Asters, NP  polyethylene glycol powder (GLYCOLAX/MIRALAX) 17 GM/SCOOP powder Take 17 g by mouth daily. 08/29/20   Kallie Locks, FNP  potassium chloride SA (KLOR-CON) 20 MEQ tablet Take 1 tablet (20 mEq total) by mouth daily. 09/05/20   [name], PA-C  rivaroxaban (XARELTO) 20 MG TABS tablet TAKE 1 TABLET (20 MG TOTAL) BY MOUTH DAILY WITH SUPPER. 09/26/20 09/26/21  Inez Catalina, MD  sitaGLIPtin (JANUVIA) 25 MG tablet Take 1 tablet (25 mg total) by mouth daily. 09/17/20   Kallie Locks, FNP  spironolactone (ALDACTONE) 25 MG tablet TAKE 1/2 TABLET (12.5 MG TOTAL) BY MOUTH DAILY. 09/27/20 09/27/21  Belva Agee, MD  Vitamin D, Ergocalciferol, (DRISDOL) 1.25 MG (50000 UNIT) CAPS capsule Take 1 capsule (50,000 Units total) by mouth every 7 (seven) days. 01/05/20    Kallie Locks, FNP    Allergies    Patient has no known allergies.  Review of Systems   Review of Systems  Constitutional:  Negative for chills and fever.  HENT:  Negative for sore throat.   Eyes:  Negative for redness.  Respiratory:  Positive for cough and shortness of breath.   Cardiovascular:  Positive for leg swelling. Negative for chest pain and palpitations.  Gastrointestinal:  Negative for abdominal pain, blood in stool and vomiting.  Genitourinary:  Negative for flank pain.  Musculoskeletal:  Negative for back pain and neck pain.  Skin:  Negative for rash.  Neurological:  Negative for headaches.  Hematological:  Does not bruise/bleed easily.  Psychiatric/Behavioral:  Negative for confusion.    Physical Exam Updated Vital Signs BP (!) 155/123   Pulse (!) 116   Temp 99.3 F (37.4 C) (Oral)   Resp 18  SpO2 100%   Physical Exam Vitals and nursing note reviewed.  Constitutional:      Appearance: Normal appearance. He is well-developed.  HENT:     Head: Atraumatic.     Nose: Nose normal.     Mouth/Throat:     Mouth: Mucous membranes are moist.     Pharynx: Oropharynx is clear.  Eyes:     General: No scleral icterus.    Conjunctiva/sclera: Conjunctivae normal.  Neck:     Trachea: No tracheal deviation.  Cardiovascular:     Rate and Rhythm: Normal rate and regular rhythm.     Pulses: Normal pulses.     Heart sounds: Normal heart sounds. No murmur heard.   No friction rub. No gallop.  Pulmonary:     Effort: Pulmonary effort is normal. No accessory muscle usage or respiratory distress.     Breath sounds: Normal breath sounds.  Abdominal:     General: Bowel sounds are normal. There is no distension.     Palpations: Abdomen is soft.     Tenderness: There is no abdominal tenderness. There is no guarding.  Genitourinary:    Comments: No cva tenderness. Musculoskeletal:     Cervical back: Normal range of motion and neck supple. No rigidity.     Comments:  Mild symmetric foot and lower leg edema bilaterally. No calf pain or tenderness.   Skin:    General: Skin is warm and dry.     Findings: No rash.  Neurological:     Mental Status: He is alert.     Comments: Alert, speech clear.   Psychiatric:        Mood and Affect: Mood normal.    ED Results / Procedures / Treatments   Labs (all labs ordered are listed, but only abnormal results are displayed) Results for orders placed or performed during the hospital encounter of 02/02/21  Basic metabolic panel  Result Value Ref Range   Sodium 138 135 - 145 mmol/L   Potassium 4.6 3.5 - 5.1 mmol/L   Chloride 104 98 - 111 mmol/L   CO2 24 22 - 32 mmol/L   Glucose, Bld 135 (H) 70 - 99 mg/dL   BUN 17 6 - 20 mg/dL   Creatinine, Ser 2.99 (H) 0.61 - 1.24 mg/dL   Calcium 9.2 8.9 - 37.1 mg/dL   GFR, Estimated 59 (L) >60 mL/min   Anion gap 10 5 - 15  CBC  Result Value Ref Range   WBC 7.1 4.0 - 10.5 K/uL   RBC 5.91 (H) 4.22 - 5.81 MIL/uL   Hemoglobin 15.4 13.0 - 17.0 g/dL   HCT 69.6 78.9 - 38.1 %   MCV 81.9 80.0 - 100.0 fL   MCH 26.1 26.0 - 34.0 pg   MCHC 31.8 30.0 - 36.0 g/dL   RDW 01.7 (H) 51.0 - 25.8 %   Platelets 204 150 - 400 K/uL   nRBC 0.3 (H) 0.0 - 0.2 %   DG Chest 2 View  Result Date: 02/02/2021 CLINICAL DATA:  Shortness of breath. EXAM: CHEST - 2 VIEW COMPARISON:  07/19/2020 FINDINGS: Heart is enlarged. There is mild perihilar peribronchial thickening. No focal consolidations or pleural effusions. No evidence for pulmonary edema. IMPRESSION: Stable cardiomegaly. Bronchitic changes. Electronically Signed   By: Norva Pavlov M.D.   On: 02/02/2021 10:35    EKG EKG Interpretation  Date/Time:  Saturday February 02 2021 09:26:36 EDT Ventricular Rate:  117 PR Interval:  148 QRS Duration: 86 QT Interval:  354  QTC Calculation: 493 R Axis:   154 Text Interpretation: Sinus tachycardia Right axis deviation Non-specific ST-t changes Confirmed by Cathren Laine ([id number]) on 02/02/2021 11:15:56  AM  Radiology DG Chest 2 View  Result Date: 02/02/2021 CLINICAL DATA:  Shortness of breath. EXAM: CHEST - 2 VIEW COMPARISON:  07/19/2020 FINDINGS: Heart is enlarged. There is mild perihilar peribronchial thickening. No focal consolidations or pleural effusions. No evidence for pulmonary edema. IMPRESSION: Stable cardiomegaly. Bronchitic changes. Electronically Signed   By: Norva Pavlov M.D.   On: 02/02/2021 10:35    Procedures Procedures   Medications Ordered in ED Medications - No data to display  ED Course  I have reviewed the triage vital signs and the nursing notes.  Pertinent labs & imaging results that were available during my care of the patient were reviewed by me and considered in my medical decision making (see chart for details).    MDM Rules/Calculators/A&P                         Iv ns. Continuous pulse ox and  cardiac monitoring. Stat labs. Imaging. Ecg.   Reviewed nursing notes and prior charts for additional history.   Labs reviewed/interpreted by me - wbc and hgb normal.   CXR reviewed/interpreted by me - no pna  Lasix iv.   Recheck breathing comfortably. No chest pain.   Pt currently appears stable for d/c.   Rec close pcp/cardiology f/u.  Return precautions provided.    Final Clinical Impression(s) / ED Diagnoses Final diagnoses:  None    Rx / DC Orders ED Discharge Orders     None        Cathren Laine, MD 02/06/21 1034

## 2021-02-02 NOTE — Discharge Instructions (Addendum)
It was our pleasure to provide your ER care today - we hope that you feel better.  Your blood pressure was quite high today - continue your blood pressure medication (and make sure not to skip or miss any doses), limit salt intake, and follow up with your doctor/cardiologist in the next 2-3 days for recheck - call office Monday AM for appointment. Take the 80 mg dose of your lasix tomorrow.  From today's labs, your kidney function test is mildly increased from baseline (creatinine 1.49) - discuss with your doctor/follow up with your doctor in the next few days.    Return to ER if worse, new symptoms, fevers, chest pain, trouble breathing, or other emergency concern.

## 2021-02-02 NOTE — ED Triage Notes (Signed)
Patient coming from home, compliant of shortness of breath and leg swelling. Patient endorses taking furosemide but does not think it is working.

## 2021-02-02 NOTE — ED Provider Notes (Signed)
[facility] EMERGENCY DEPARTMENT Provider Note   CSN: [id number] Arrival date & time: 02/02/21  2952     History Chief Complaint  Patient presents with   Shortness of Breath   Leg Swelling    Rodney Kane is a 45 y.o. male.   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   Prolonged QT interval 07/25/2020   Sinus tachycardia 07/20/2020   Acute CVA (cerebrovascular accident) (HCC) 07/20/2020   COVID-19 virus infection 07/19/2020   Noncompliance with medication regimen 01/04/2020   Hyperglycemia 01/04/2020   Urine ketones 01/04/2020   Essential hypertension 01/04/2020   Nonischemic cardiomyopathy (HCC) 01/16/2017   CKD (chronic kidney disease), 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  PAP mean 19 million mercury, PCWP 15 mmHg.  LVEDP 9 mmHg.  Cardiac output/index 7.84/2.7.  RAP mean 4 mmHg.   TRANSTHORACIC ECHOCARDIOGRAM  01/04/2017   EF 30 to 35%.  Moderate LVH.  Moderate severely reduced EF.  Diffuse HK.  Normal valves.   TRANSTHORACIC ECHOCARDIOGRAM  07/20/2020    Severely reduced LV function-EF 20 to 25%, global HK.  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  Mildly reduced cardiac function.  Unable to assess PA pressures.  Moderate LA dilation.  Small pericardial effusion.  Relatively normal mitral valve and tricuspid valve.  Normal RA pressures.       Family History  Problem Relation Age of Onset   Multiple sclerosis Mother    Diabetic kidney disease Mother    Hypertension Mother     Social History   Tobacco Use   Smoking status: Never   Smokeless tobacco: Never  Vaping Use   Vaping Use: Never used  Substance Use Topics   Alcohol use: No   Drug use: No    Home Medications Prior to Admission medications   Medication Sig Start Date End Date Taking? Authorizing Provider  albuterol (VENTOLIN HFA) 108 (90 Base) MCG/ACT inhaler Inhale 2 puffs into the lungs every 6 (six) hours as needed for wheezing or shortness of breath. 09/19/20   Kallie Locks, FNP  atorvastatin (LIPITOR) 40 MG tablet Take 1 tablet (40 mg total) by mouth daily.  01/14/21   Ihor Austin, NP  carvedilol (COREG) 12.5 MG tablet Take 1 tablet (12.5 mg total) by mouth 2 (two) times daily with a meal. 08/21/20   Kallie Locks, FNP  cetirizine (ZYRTEC ALLERGY) 10 MG tablet Take 1 tablet (10 mg total) by mouth daily. 09/19/20   Kallie Locks, FNP  furosemide (LASIX) 40 MG tablet Take 80 mg  ( 2 tablets of 40 mg) every other day with 40 mg (1 tablet) the opposite day 09/14/20   [name], MD  glipiZIDE (GLUCOTROL) 10 MG tablet Take 1 tablet (10 mg total)  by mouth 2 (two) times daily before a meal. 09/17/20   Kallie Locks, FNP  hydrocortisone cream 1 % Apply 1 application topically 2 (two) times daily. 09/19/20   Kallie Locks, FNP  hydrOXYzine (ATARAX/VISTARIL) 10 MG tablet Take 1 tablet (10 mg total) by mouth 3 (three) times daily as needed. 09/19/20   Kallie Locks, FNP  losartan (COZAAR) 50 MG tablet Take 1.5 tablets (75 mg total) by mouth 2 (two) times daily. 12/04/20 03/04/21  Ronney Asters, NP  polyethylene glycol powder (GLYCOLAX/MIRALAX) 17 GM/SCOOP powder Take 17 g by mouth daily. 08/29/20   Kallie Locks, FNP  potassium chloride SA (KLOR-CON) 20 MEQ tablet Take 1 tablet (20 mEq total) by mouth daily. 09/05/20   [name], PA-C  rivaroxaban (XARELTO) 20 MG TABS tablet TAKE 1 TABLET (20 MG TOTAL) BY MOUTH DAILY WITH SUPPER. 09/26/20 09/26/21  Inez Catalina, MD  sitaGLIPtin (JANUVIA) 25 MG tablet Take 1 tablet (25 mg total) by mouth daily. 09/17/20   Kallie Locks, FNP  spironolactone (ALDACTONE) 25 MG tablet TAKE 1/2 TABLET (12.5 MG TOTAL) BY MOUTH DAILY. 09/27/20 09/27/21  Belva Agee, MD  Vitamin D, Ergocalciferol, (DRISDOL) 1.25 MG (50000 UNIT) CAPS capsule Take 1 capsule (50,000 Units total) by mouth every 7 (seven) days. 01/05/20   Kallie Locks, FNP    Allergies    Patient has no known allergies.  Review of Systems   Review of Systems  Respiratory:  Positive for shortness of breath.    Physical Exam Updated Vital Signs BP 119/87   Pulse 98   Temp 99.3 F (37.4 C) (Oral)   Resp 15   SpO2 99%   Physical Exam  ED Results / Procedures / Treatments   Labs (all labs ordered are listed, but only abnormal results are displayed) Results for orders placed or performed during the hospital encounter of 02/02/21  Basic metabolic panel  Result Value Ref Range   Sodium 138 135 - 145 mmol/L   Potassium 4.6 3.5 - 5.1 mmol/L   Chloride 104 98 - 111 mmol/L   CO2 24 22 - 32 mmol/L   Glucose, Bld 135 (H) 70  - 99 mg/dL   BUN 17 6 - 20 mg/dL   Creatinine, Ser 8.11 (H) 0.61 - 1.24 mg/dL   Calcium 9.2 8.9 - 91.4 mg/dL   GFR, Estimated 59 (L) >60 mL/min   Anion gap 10 5 - 15  CBC  Result Value Ref Range   WBC 7.1 4.0 - 10.5 K/uL   RBC 5.91 (H) 4.22 - 5.81 MIL/uL   Hemoglobin 15.4 13.0 - 17.0 g/dL   HCT 78.2 95.6 - 21.3 %   MCV 81.9 80.0 - 100.0 fL   MCH 26.1 26.0 - 34.0 pg   MCHC 31.8 30.0 - 36.0 g/dL   RDW 08.6 (H) 57.8 - 46.9 %  Platelets 204 150 - 400 K/uL   nRBC 0.3 (H) 0.0 - 0.2 %  Brain natriuretic peptide  Result Value Ref Range   B Natriuretic Peptide 1,620.5 (H) 0.0 - 100.0 pg/mL   DG Chest 2 View  Result Date: 02/02/2021 CLINICAL DATA:  Shortness of breath. EXAM: CHEST - 2 VIEW COMPARISON:  07/19/2020 FINDINGS: Heart is enlarged. There is mild perihilar peribronchial thickening. No focal consolidations or pleural effusions. No evidence for pulmonary edema. IMPRESSION: Stable cardiomegaly. Bronchitic changes. Electronically Signed   By: Norva Pavlov M.D.   On: 02/02/2021 10:35     EKG EKG Interpretation  Date/Time:  Saturday February 02 2021 09:26:36 EDT Ventricular Rate:  117 PR Interval:  148 QRS Duration: 86 QT Interval:  354 QTC Calculation: 493 R Axis:   154 Text Interpretation: Sinus tachycardia Right axis deviation Non-specific ST-t changes Confirmed by Cathren Laine ([id number]) on 02/02/2021 11:15:56 AM  Radiology DG Chest 2 View  Result Date: 02/02/2021 CLINICAL DATA:  Shortness of breath. EXAM: CHEST - 2 VIEW COMPARISON:  07/19/2020 FINDINGS: Heart is enlarged. There is mild perihilar peribronchial thickening. No focal consolidations or pleural effusions. No evidence for pulmonary edema. IMPRESSION: Stable cardiomegaly. Bronchitic changes. Electronically Signed   By: Norva Pavlov M.D.   On: 02/02/2021 10:35    Procedures Procedures   Medications Ordered in ED Medications  furosemide (LASIX) injection 60 mg (60 mg Intravenous Given 02/02/21 1649)   nitroGLYCERIN (NITROGLYN) 2 % ointment 1 inch (1 inch Topical Given 02/02/21 1647)  hydrALAZINE (APRESOLINE) tablet 50 mg (50 mg Oral Given 02/02/21 1651)  carvedilol (COREG) tablet 12.5 mg (12.5 mg Oral Given 02/02/21 1650)  losartan (COZAAR) tablet 50 mg (50 mg Oral Given 02/02/21 1652)    ED Course  I have reviewed the triage vital signs and the nursing notes.  Pertinent labs & imaging results that were available during my care of the patient were reviewed by me and considered in my medical decision making (see chart for details).    MDM Rules/Calculators/A&P                          Iv ns. Ecg. Stat labs.   Reviewed nursing notes and prior charts for additional history.   Labs reviewed/interpreted by me - wbc normal, hgb normal. Await bnp.  Delay in bnp  - lab called - rn updated pt, labs still pending.   Additional labs reviewed/interpreted by me - bnp high. Lasix iv. Pts bp is high. Ntg paste. Hydralazine po. Pt indicates has not yet taken his bp meds today, but does indicate has adequate supply at home. Pt given dose of his bp meds.   Recheck pt - no increased wob. No chest pain or sob. Pt indicates feels ready for d/c. BP is much improved.   Rec close outpt cardiology/pcp f/u.   Return precautions provided.   CRITICAL CARE RE acute on chronic chf exacerbated by uncontrolled htn/afterload issue, parental diuretic therapy, nitrate therapy Performed by: Suzi Roots Total critical care time: 40 minutes Critical care time was exclusive of separately billable procedures and treating other patients. Critical care was necessary to treat or prevent imminent or life-threatening deterioration. Critical care was time spent personally by me on the following activities: development of treatment plan with patient and/or surrogate as well as nursing, discussions with consultants, evaluation of patient's response to treatment, examination of patient, obtaining history from patient or  surrogate, ordering and performing treatments and  interventions, ordering and review of laboratory studies, ordering and review of radiographic studies, pulse oximetry and re-evaluation of patient's condition.    Final Clinical Impression(s) / ED Diagnoses Final diagnoses:  None    Rx / DC Orders ED Discharge Orders     None        Cathren Laine, MD 02/02/21 [phone]

## 2021-02-02 NOTE — ED Notes (Signed)
Inquired about BNP lab at this time. Spoke to lab who states BNP is still in process. Pending results.

## 2021-02-03 LAB — SARS CORONAVIRUS 2 (TAT 6-24 HRS): SARS Coronavirus 2: NEGATIVE

## 2021-02-06 NOTE — Progress Notes (Signed)
Cardiology Office Note:    Date:  02/07/2021   ID:  Rodney Kane, DOB 1976/07/14, MRN [id number]  PCP:  Pcp, No Referring MD: No ref. provider found    [facility]  Cardiologist:  Bryan Lemmaavid Harding, MD  Reason for visit: Follow-up ER visit  History of Present Illness:    Rodney Kane is a 45 y.o. male with a hx of diabetes, HTN, nonischemic cardiomyopathy-diagnosed 2018 in the setting of uncontrolled hypertension, preceded by URI symptoms, normal coronary arteries by cardiac catheterization, he was lost to cardiology follow-up and was not seen again until 12/21.  He was admitted for COVID-19.  He was noted to have left MCA CVA, LV thrombus, and had further decreased LVEF to 20-25%.  This was in the setting of noncompliance with his medications.  Medical management was recommended.  He was also noted to have some outpatient noncompliance issues.  He left AMA from his primary care office with severely elevated blood pressure.  He was readmitted 09/25/2020 with recurrent CVA in the context of subtherapeutic INR.   Cardiology was consulted for LV thrombus and continued severe LV dysfunction.  His echocardiogram 09/25/2020 showed LVEF less than 20%, G3 DD.  He was discharged on 09/27/2020 on Xarelto, losartan, carvedilol, and spironolactone.  He was last seen by Edd FabianJesse Cleaver 11/2020 with noted increased LE edema.  Recommended compression stockings.  Losartan increased.  Recommended follow-up in 1 month - pt was a no show.  [facility] with SOB x 1-2 weeks, LE edema and cough.  He was given IV lasix and set up with cardiology follow-up.  Today the patient comes to clinic alone.  He often gets frustrated by his stutter.  He states this is new post stroke.  He denies other deficits post stroke.  He states he had significant lower extremity edema before going to the ER on July 16.  This improved with IV Lasix.  His biggest complaint is bloating  even after just drinking water or having a popsicle.  His bloating will cause him to have difficulty breathing.  He mentions constipation.  He takes Miralax as needed.  He complains of productive cough.  + PND.  States he sleeps pretty flat on his side.  Denies palpitations, lightheadedness, syncope, chest pain, and bleeding.  He states he takes his medications at 3 PM.  He often does not take his Coreg or Cozaar at night secondary to nausea or visual spotting.    He states his weight went up to 312 pounds before going to the ER.  He is down to 305 by his home scale and the scale today in the office.  He believes his dry weight is somewhere around 270 pounds.  The lowest weight we have in the last 6 months is 266 pounds in March 2022.   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   Stroke [facility](HCC)    Urine ketones 12/2019   Vitamin D deficiency 12/2019    Past Surgical History:  Procedure Laterality Date   left knee  surgery     RIGHT/LEFT HEART CATH AND CORONARY ANGIOGRAPHY N/A 01/05/2017   Procedure: Right/Left Heart Cath and Coronary Angiography;  Surgeon: [name], MD;  Location: [facility] INVASIVE CV LAB;  Service: Cardiovascular; angiograph normal coronaries.  PAP mean 19 million mercury, PCWP 15 mmHg.  LVEDP 9 mmHg.  Cardiac output/index 7.84/2.7.  RAP mean 4 mmHg.   TRANSTHORACIC ECHOCARDIOGRAM  01/04/2017   EF 30 to 35%.  Moderate LVH.  Moderate severely reduced EF.  Diffuse HK.  Normal valves.   TRANSTHORACIC ECHOCARDIOGRAM  07/20/2020    Severely reduced LV function-EF 20  to 25%, global HK.  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  Mildly reduced cardiac function.  Unable to assess PA pressures.  Moderate LA dilation.  Small pericardial effusion.  Relatively normal mitral valve and tricuspid valve.  Normal RA pressures.    Current Medications: Current Meds  Medication Sig   atorvastatin (LIPITOR) 40 MG tablet Take 1 tablet (40 mg total) by mouth daily.   glipiZIDE (GLUCOTROL) 10 MG tablet Take 1 tablet (10 mg total) by mouth 2 (two) times daily before a meal.   hydrOXYzine (ATARAX/VISTARIL) 10 MG tablet Take 1 tablet (10 mg total) by mouth 3 (three) times daily as needed.   losartan (COZAAR) 100 MG tablet Take 1.5 tablets (150 mg total) by mouth daily.   metoprolol succinate (TOPROL-XL) 50 MG 24 hr tablet Take 1 tablet (50 mg total) by mouth daily. Take with or immediately following a meal.   polyethylene glycol powder (GLYCOLAX/MIRALAX) 17 GM/SCOOP powder Take 17 g by mouth daily.   potassium chloride SA (KLOR-CON) 20 MEQ tablet Take 1 tablet (20 mEq total) by mouth daily.   rivaroxaban (XARELTO) 20 MG TABS tablet TAKE 1 TABLET (20 MG TOTAL) BY MOUTH DAILY WITH SUPPER.   sitaGLIPtin (JANUVIA) 25 MG tablet Take 1 tablet (25 mg total) by mouth daily.   spironolactone (ALDACTONE) 25 MG tablet TAKE 1/2 TABLET (12.5 MG TOTAL) BY MOUTH DAILY.   torsemide (DEMADEX) 20 MG tablet Take 2 tablets (40 mg total) by mouth daily.   Vitamin D, Ergocalciferol, (DRISDOL) 1.25 MG (50000 UNIT) CAPS capsule Take 1 capsule (50,000 Units total) by mouth every 7 (seven) days.   [DISCONTINUED] carvedilol (COREG) 12.5 MG tablet Take 1 tablet (12.5 mg total) by mouth 2 (two) times daily with a meal.   [DISCONTINUED] furosemide (LASIX) 40 MG tablet Take 80 mg  ( 2 tablets of 40 mg) every other day with 40 mg (1 tablet) the opposite day (Patient taking differently: Take 80 mg  ( 2 tablets of 40 mg) every other day with 40 mg (1 tablet) the opposite day)    [DISCONTINUED] losartan (COZAAR) 50 MG tablet Take 1.5 tablets (75 mg total) by mouth 2 (two) times daily.     Allergies:   Patient has no known allergies.   Social History   Socioeconomic History   Marital status: Divorced    Spouse name: Not on file   Number of children: 3   Years of education: Not on file   Highest education level: Not on file  Occupational History    Employer: Research scientist (medical)  Tobacco Use   Smoking status: Never   Smokeless tobacco: Never  Vaping Use   Vaping Use: Never used  Substance and Sexual Activity   Alcohol use: No   Drug use: No   Sexual activity: Yes  Other Topics Concern   Not on file  Social  History Narrative   Not on file   Social Determinants of Health   Financial Resource Strain: High Risk   Difficulty of Paying Living Expenses: Hard  Food Insecurity: No Food Insecurity   Worried About Programme researcher, broadcasting/film/video in the Last Year: Never true   Ran Out of Food in the Last Year: Never true  Transportation Needs: No Transportation Needs   Lack of Transportation (Medical): No   Lack of Transportation (Non-Medical): No  Physical Activity: Inactive   Days of Exercise per Week: 0 days   Minutes of Exercise per Session: 0 min  Stress: Stress Concern Present   Feeling of Stress : To some extent  Social Connections: Not on file     Family History: The patient's family history includes Diabetic kidney disease in his mother; Hypertension in his mother; Multiple sclerosis in his mother.  ROS:   Please see the history of present illness.     EKGs/Labs/Other Studies Reviewed:    Recent Labs: 07/22/2020: Magnesium 2.0 01/10/2021: ALT 14; TSH 1.520 02/02/2021: B Natriuretic Peptide 1,620.5; BUN 17; Creatinine, Ser 1.49; Hemoglobin 15.4; Platelets 204; Potassium 4.6; Sodium 138  Recent Lipid Panel    Component Value Date/Time   CHOL 163 01/10/2021 1159   TRIG 63 01/10/2021 1159   HDL 57 01/10/2021 1159   CHOLHDL 2.9 01/10/2021 1159    CHOLHDL 3.0 09/26/2020 0327   VLDL 8 09/26/2020 0327   LDLCALC 94 01/10/2021 1159    Physical Exam:    VS:  BP (!) 138/102 (BP Location: Left Arm, Patient Position: Sitting, Cuff Size: Large) Comment: Has not taken medication  Pulse (!) 110   Ht 6\' 1"  (1.854 m)   Wt (!) 305 lb (138.3 kg)   SpO2 99%   BMI 40.24 kg/m     Wt Readings from Last 3 Encounters:  02/07/21 (!) 305 lb (138.3 kg)  01/10/21 291 lb (132 kg)  12/04/20 284 lb 6.4 oz (129 kg)     GEN: Obese, Well nourished, well developed in no acute distress HEENT: Normal NECK: Unable to appreciate JVD; No carotid bruits CARDIAC: RRR, no murmurs, rubs, gallops RESPIRATORY:  Clear to auscultation without rales, wheezing or rhonchi  ABDOMEN: Soft, non-tender, non-distended MUSCULOSKELETAL: Minimal LE edema to mid shin down; No deformity  SKIN: Warm and dry NEUROLOGIC:  Alert and oriented PSYCHIATRIC:  Normal affect   ASSESSMENT AND PLAN   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 - With his weight up 35 pounds over his dry weight, I will change his Lasix to torsemide 40 daily. - Check BMET and BNP today.   - I will see him back in 2 weeks.  Anticipate he will need referral to heart failure clinic and heart failure pharmacist for optimal management.  Ideally, this patient would be on [place] and either [place] or [place].  Consider rechecking 2D echo in 3 months after optimal med therapy to determine ICD candidacy (QRS <133ms).  LV thrombus -Continue Xarelto    Hyperlipidemia - LDL 94 in June 2022 - Continue Lipitor 40 mg daily  HTN, poorly controlled - BP high this morning as patient has not taken his medications today yet - We will follow his blood pressure at next appointment with above meds adjustment.    Disposition: Follow-up in 2  weeks.      Medication Adjustments/Labs  and Tests Ordered: Current medicines are reviewed at length with the patient today.  Concerns regarding medicines are outlined above.  Orders Placed This Encounter  Procedures   Brain natriuretic peptide   Basic metabolic panel   Meds ordered this encounter  Medications   metoprolol succinate (TOPROL-XL) 50 MG 24 hr tablet    Sig: Take 1 tablet (50 mg total) by mouth daily. Take with or immediately following a meal.    Dispense:  90 tablet    Refill:  3   losartan (COZAAR) 100 MG tablet    Sig: Take 1.5 tablets (150 mg total) by mouth daily.    Dispense:  135 tablet    Refill:  1   torsemide (DEMADEX) 20 MG tablet    Sig: Take 2 tablets (40 mg total) by mouth daily.    Dispense:  180 tablet    Refill:  1    Patient Instructions  Medication Instructions:  Stop Coreg. Start Toprol XL 50 mg (1 Tablet Daily). Start Losartan 150 mg ( 1.5 Tablet Daily). Stop Lasix. Start Torsemide 20 mg ( 2 Tablets Daily 40 mg). *If you need a refill on your cardiac medications before your next appointment, please call your pharmacy*   Lab Work: BMP, BNP Today If you have labs (blood work) drawn today and your tests are completely normal, you will receive your results only by: MyChart Message (if you have MyChart) OR A paper copy in the mail If you have any lab test that is abnormal or we need to change your treatment, we will call you to review the results.   Testing/Procedures: No Testing    Follow-Up: At [facility], you and your health needs are our priority.  As part of our continuing mission to provide you with exceptional heart care, we have created designated Provider Care Teams.  These Care Teams include your primary Cardiologist (physician) and Advanced Practice Providers (APPs -  Physician Assistants and Nurse Practitioners) who all work together to provide you with the care you need, when you need it.  We recommend signing up for the  patient portal called "MyChart".  Sign up information is provided on this After Visit Summary.  MyChart is used to connect with patients for Virtual Visits (Telemedicine).  Patients are able to view lab/test results, encounter notes, upcoming appointments, etc.  Non-urgent messages can be sent to your provider as well.   To learn more about what you can do with MyChart, go to [email or web address].    Your next appointment:   2 week(s)  The format for your next appointment:   In Person  Provider:   Juanda Crumble PA-C   Other Instructions Per Victorino Dike schedule on August 3rd 12:45 pm slot. Constipation, Adult Constipation is when a person has fewer than three bowel movements in a week, has difficulty having a bowel movement, or has stools (feces) that are dry, hard, or larger than normal. Constipation may be caused by an underlying condition. It may become worse with age if a person takes certainmedicines and does not take in enough fluids. Follow these instructions at home: Eating and drinking  Eat foods that have a lot of fiber, such as beans, whole grains, and fresh fruits and vegetables. Limit foods that are low in fiber and high in fat and processed sugars, such as fried or sweet foods. These include french fries, hamburgers, cookies, candies, and soda. Drink enough fluid to keep your urine pale yellow.  General instructions Exercise regularly or as  told by your health care provider. Try to do 150 minutes of moderate exercise each week. Use the bathroom when you have the urge to go. Do not hold it in. Take over-the-counter and prescription medicines only as told by your health care provider. This includes any fiber supplements. During bowel movements: Practice deep breathing while relaxing the lower abdomen. Practice pelvic floor relaxation. Watch your condition for any changes. Let your health care provider know about them. Keep all follow-up visits as told by your health  care provider. This is important. Contact a health care provider if: You have pain that gets worse. You have a fever. You do not have a bowel movement after 4 days. You vomit. You are not hungry or you lose weight. You are bleeding from the opening between the buttocks (anus). You have thin, pencil-like stools. Get help right away if: You have a fever and your symptoms suddenly get worse. You leak stool or have blood in your stool. Your abdomen is bloated. You have severe pain in your abdomen. You feel dizzy or you faint. Summary Constipation is when a person has fewer than three bowel movements in a week, has difficulty having a bowel movement, or has stools (feces) that are dry, hard, or larger than normal. Eat foods that have a lot of fiber, such as beans, whole grains, and fresh fruits and vegetables. Drink enough fluid to keep your urine pale yellow. Take over-the-counter and prescription medicines only as told by your health care provider. This includes any fiber supplements. This information is not intended to replace advice given to you by your health care provider. Make sure you discuss any questions you have with your healthcare provider. Document Revised: 05/25/2019 Document Reviewed: 05/25/2019 Elsevier Patient Education  [address].     Signed, Bernette Mayers  02/07/2021 12:53 PM    [facility]

## 2021-02-07 ENCOUNTER — Encounter: Payer: Self-pay | Admitting: Nurse Practitioner

## 2021-02-07 ENCOUNTER — Encounter: Payer: Self-pay | Admitting: Student

## 2021-02-07 ENCOUNTER — Ambulatory Visit (HOSPITAL_COMMUNITY)
Admission: RE | Admit: 2021-02-07 | Discharge: 2021-02-07 | Disposition: A | Discharge status: Home / Self Care | Payer: BC Managed Care – PPO | Source: Ambulatory Visit | Attending: Nurse Practitioner | Admitting: Nurse Practitioner

## 2021-02-07 ENCOUNTER — Ambulatory Visit (INDEPENDENT_AMBULATORY_CARE_PROVIDER_SITE_OTHER): Payer: BC Managed Care – PPO | Admitting: Nurse Practitioner

## 2021-02-07 ENCOUNTER — Ambulatory Visit (INDEPENDENT_AMBULATORY_CARE_PROVIDER_SITE_OTHER): Payer: BC Managed Care – PPO | Admitting: Physician Assistant

## 2021-02-07 ENCOUNTER — Other Ambulatory Visit: Payer: Self-pay

## 2021-02-07 VITALS — BP 149/108 | HR 113 | Temp 98.1°F | Ht 73.0 in | Wt 307.6 lb

## 2021-02-07 VITALS — BP 138/102 | HR 110 | Ht 73.0 in | Wt 305.0 lb

## 2021-02-07 DIAGNOSIS — I5043 Acute on chronic combined systolic (congestive) and diastolic (congestive) heart failure: Secondary | ICD-10-CM | POA: Diagnosis not present

## 2021-02-07 DIAGNOSIS — I428 Other cardiomyopathies: Secondary | ICD-10-CM | POA: Diagnosis not present

## 2021-02-07 DIAGNOSIS — R140 Abdominal distension (gaseous): Secondary | ICD-10-CM | POA: Diagnosis not present

## 2021-02-07 DIAGNOSIS — I10 Essential (primary) hypertension: Secondary | ICD-10-CM | POA: Diagnosis not present

## 2021-02-07 DIAGNOSIS — R109 Unspecified abdominal pain: Secondary | ICD-10-CM | POA: Diagnosis not present

## 2021-02-07 DIAGNOSIS — I513 Intracardiac thrombosis, not elsewhere classified: Secondary | ICD-10-CM | POA: Diagnosis not present

## 2021-02-07 DIAGNOSIS — Z1159 Encounter for screening for other viral diseases: Secondary | ICD-10-CM

## 2021-02-07 DIAGNOSIS — E785 Hyperlipidemia, unspecified: Secondary | ICD-10-CM

## 2021-02-07 MED ORDER — LOSARTAN POTASSIUM 100 MG PO TABS: 150.0000 mg | ORAL_TABLET | Freq: Every day | ORAL | 1 refills | Status: DC

## 2021-02-07 MED ORDER — METOPROLOL SUCCINATE ER 50 MG PO TB24: 50.0000 mg | ORAL_TABLET | Freq: Every day | ORAL | 3 refills | Status: DC

## 2021-02-07 MED ORDER — TORSEMIDE 20 MG PO TABS: 40.0000 mg | ORAL_TABLET | Freq: Every day | ORAL | 1 refills | Status: DC

## 2021-02-07 NOTE — Patient Instructions (Addendum)
Diabetes Mellitus and Nutrition, Adult When you have diabetes, or diabetes mellitus, it is very important to have healthy eating habits because your blood sugar (glucose) levels are greatly affected by what you eat and drink. Eating healthy foods in the right amounts, at about the same times every day, can help you: Control your blood glucose. Lower your risk of heart disease. Improve your blood pressure. Reach or maintain a healthy weight. What can affect my meal plan? Every person with diabetes is different, and each person has different needs for a meal plan. Your health care provider may recommend that you work with a dietitian to make a meal plan that is best for you. Your meal plan may vary depending on factors such as: The calories you need. The medicines you take. Your weight. Your blood glucose, blood pressure, and cholesterol levels. Your activity level. Other health conditions you have, such as heart or kidney disease. How do carbohydrates affect me? Carbohydrates, also called carbs, affect your blood glucose level more than any other type of food. Eating carbs naturally raises the amount of glucose in your blood. Carb counting is a method for keeping track of how many carbs you eat. Counting carbs is important to keep your blood glucose at a healthy level,especially if you use insulin or take certain oral diabetes medicines. It is important to know how many carbs you can safely have in each meal. This is different for every person. Your dietitian can help you calculate how manycarbs you should have at each meal and for each snack. How does alcohol affect me? Alcohol can cause a sudden decrease in blood glucose (hypoglycemia), especially if you use insulin or take certain oral diabetes medicines. Hypoglycemia can be a life-threatening condition. Symptoms of hypoglycemia, such as sleepiness, dizziness, and confusion, are similar to symptoms of having too much alcohol. Do not drink  alcohol if: Your health care provider tells you not to drink. You are pregnant, may be pregnant, or are planning to become pregnant. If you drink alcohol: Do not drink on an empty stomach. Limit how much you use to: 0-1 drink a day for women. 0-2 drinks a day for men. Be aware of how much alcohol is in your drink. In the [place] one drink equals one 12 oz bottle of beer (355 mL), one 5 oz glass of wine (148 mL), or one 1 oz glass of hard liquor (44 mL). Keep yourself hydrated with water, diet soda, or unsweetened iced tea. Keep in mind that regular soda, juice, and other mixers may contain a lot of sugar and must be counted as carbs. What are tips for following this plan?  Reading food labels Start by checking the serving size on the "Nutrition Facts" label of packaged foods and drinks. The amount of calories, carbs, fats, and other nutrients listed on the label is based on one serving of the item. Many items contain more than one serving per package. Check the total grams (g) of carbs in one serving. You can calculate the number of servings of carbs in one serving by dividing the total carbs by 15. For example, if a food has 30 g of total carbs per serving, it would be equal to 2 servings of carbs. Check the number of grams (g) of saturated fats and trans fats in one serving. Choose foods that have a low amount or none of these fats. Check the number of milligrams (mg) of salt (sodium) in one serving. Most people should limit total  sodium intake to less than 2,300 mg per day. Always check the nutrition information of foods labeled as "low-fat" or "nonfat." These foods may be higher in added sugar or refined carbs and should be avoided. Talk to your dietitian to identify your daily goals for nutrients listed on the label. Shopping Avoid buying canned, pre-made, or processed foods. These foods tend to be high in fat, sodium, and added sugar. Shop around the outside edge of the grocery store. This  is where you will most often find fresh fruits and vegetables, bulk grains, fresh meats, and fresh dairy. Cooking Use low-heat cooking methods, such as baking, instead of high-heat cooking methods like deep frying. Cook using healthy oils, such as olive, canola, or sunflower oil. Avoid cooking with butter, cream, or high-fat meats. Meal planning Eat meals and snacks regularly, preferably at the same times every day. Avoid going long periods of time without eating. Eat foods that are high in fiber, such as fresh fruits, vegetables, beans, and whole grains. Talk with your dietitian about how many servings of carbs you can eat at each meal. Eat 4-6 oz (112-168 g) of lean protein each day, such as lean meat, chicken, fish, eggs, or tofu. One ounce (oz) of lean protein is equal to: 1 oz (28 g) of meat, chicken, or fish. 1 egg.  cup (62 g) of tofu. Eat some foods each day that contain healthy fats, such as avocado, nuts, seeds, and fish. What foods should I eat? Fruits Berries. Apples. Oranges. Peaches. Apricots. Plums. Grapes. Mango. Papaya.Pomegranate. Kiwi. Cherries. Vegetables Lettuce. Spinach. Leafy greens, including kale, chard, collard greens, and mustard greens. Beets. Cauliflower. Cabbage. Broccoli. Carrots. Green beans.Tomatoes. Peppers. Onions. Cucumbers. Brussels sprouts. Grains Whole grains, such as whole-wheat or whole-grain bread, crackers, tortillas,cereal, and pasta. Unsweetened oatmeal. Quinoa. Brown or wild rice. Meats and other proteins Seafood. Poultry without skin. Lean cuts of poultry and beef. Tofu. Nuts. Seeds. Dairy Low-fat or fat-free dairy products such as milk, yogurt, and cheese. The items listed above may not be a complete list of foods and beverages you can eat. Contact a dietitian for more information. What foods should I avoid? Fruits Fruits canned with syrup. Vegetables Canned vegetables. Frozen vegetables with butter or cream sauce. Grains Refined white  flour and flour products such as bread, pasta, snack foods, andcereals. Avoid all processed foods. Meats and other proteins Fatty cuts of meat. Poultry with skin. Breaded or fried meats. Processed meat.Avoid saturated fats. Dairy Full-fat yogurt, cheese, or milk. Beverages Sweetened drinks, such as soda or iced tea. The items listed above may not be a complete list of foods and beverages you should avoid. Contact a dietitian for more information. Questions to ask a health care provider Do I need to meet with a diabetes educator? Do I need to meet with a dietitian? What number can I call if I have questions? When are the best times to check my blood glucose? Where to find more information: American Diabetes Association: diabetes.org Academy of Nutrition and Dietetics: www.eatright.[employer] of Diabetes and Digestive and Kidney Diseases: [employer] Association of Diabetes Care and Education Specialists: www.diabeteseducator.org Summary It is important to have healthy eating habits because your blood sugar (glucose) levels are greatly affected by what you eat and drink. A healthy meal plan will help you control your blood glucose and maintain a healthy lifestyle. Your health care provider may recommend that you work with a dietitian to make a meal plan that is best for you. Keep in  mind that carbohydrates (carbs) and alcohol have immediate effects on your blood glucose levels. It is important to count carbs and to use alcohol carefully. This information is not intended to replace advice given to you by your health care provider. Make sure you discuss any questions you have with your healthcare provider. Document Revised: 06/14/2019 Document Reviewed: 06/14/2019 Elsevier Patient Education  2021 [employer]  Low-Sodium Eating Plan Sodium, which is an element that makes up salt, helps you maintain a healthy balance of fluids in your body. Too much sodium can increase your  bloodpressure and cause fluid and waste to be held in your body. Your health care provider or dietitian may recommend following this plan if you have high blood pressure (hypertension), kidney disease, liver disease, or heart failure. Eating less sodium can help lower your blood pressure, reduce swelling, and protect your heart, liver, andkidneys. What are tips for following this plan? Reading food labels The Nutrition Facts label lists the amount of sodium in one serving of the food. If you eat more than one serving, you must multiply the listed amount of sodium by the number of servings. Choose foods with less than 140 mg of sodium per serving. Avoid foods with 300 mg of sodium or more per serving. Shopping  Look for lower-sodium products, often labeled as "low-sodium" or "no salt added." Always check the sodium content, even if foods are labeled as "unsalted" or "no salt added." Buy fresh foods. Avoid canned foods and pre-made or frozen meals. Avoid canned, cured, or processed meats. Buy breads that have less than 80 mg of sodium per slice.  Cooking  Eat more home-cooked food and less restaurant, buffet, and fast food. Avoid adding salt when cooking. Use salt-free seasonings or herbs instead of table salt or sea salt. Check with your health care provider or pharmacist before using salt substitutes. Cook with plant-based oils, such as canola, sunflower, or olive oil.  Meal planning When eating at a restaurant, ask that your food be prepared with less salt or no salt, if possible. Avoid dishes labeled as brined, pickled, cured, smoked, or made with soy sauce, miso, or teriyaki sauce. Avoid foods that contain MSG (monosodium glutamate). MSG is sometimes added to [place] food, bouillon, and some canned foods. Make meals that can be grilled, baked, poached, roasted, or steamed. These are generally made with less sodium. General information Most people on this plan should limit their sodium  intake to 1,500-2,000 mg (milligrams) of sodium each day. What foods should I eat? Fruits Fresh, frozen, or canned fruit. Fruit juice. Vegetables Fresh or frozen vegetables. "No salt added" canned vegetables. "No salt added"tomato sauce and paste. Low-sodium or reduced-sodium tomato and vegetable juice. Grains Low-sodium cereals, including oats, puffed wheat and rice, and shredded wheat. Low-sodium crackers. Unsalted rice. Unsalted pasta. Low-sodium bread.Whole-grain breads and whole-grain pasta. Meats and other proteins Fresh or frozen (no salt added) meat, poultry, seafood, and fish. Low-sodium canned tuna and salmon. Unsalted nuts. Dried peas, beans, and lentils withoutadded salt. Unsalted canned beans. Eggs. Unsalted nut butters. Dairy Milk. Soy milk. Cheese that is naturally low in sodium, such as ricotta cheese, fresh mozzarella, or Swiss cheese. Low-sodium or reduced-sodium cheese. Creamcheese. Yogurt. Seasonings and condiments Fresh and dried herbs and spices. Salt-free seasonings. Low-sodium mustard and ketchup. Sodium-free salad dressing. Sodium-free light mayonnaise. Fresh orrefrigerated horseradish. Lemon juice. Vinegar. Other foods Homemade, reduced-sodium, or low-sodium soups. Unsalted popcorn and pretzels.Low-salt or salt-free chips. The items listed above may not be a complete  list of foods and beverages you can eat. Contact a dietitian for more information. What foods should I avoid? Vegetables Sauerkraut, pickled vegetables, and relishes. Olives. [place] fries. Onion rings. Regular canned vegetables (not low-sodium or reduced-sodium). Regular canned tomato sauce and paste (not low-sodium or reduced-sodium). Regular tomato and vegetable juice (not low-sodium or reduced-sodium). Frozenvegetables in sauces. Grains Instant hot cereals. Bread stuffing, pancake, and biscuit mixes. Croutons. Seasoned rice or pasta mixes. Noodle soup cups. Boxed or frozen macaroni andcheese. Regular  salted crackers. Self-rising flour. Meats and other proteins Meat or fish that is salted, canned, smoked, spiced, or pickled. Precooked or cured meat, such as sausages or meat loaves. Tomasa Blase. Ham. Pepperoni. Hot dogs. Corned beef. Chipped beef. Salt pork. Jerky. Pickled herring. Anchovies andsardines. Regular canned tuna. Salted nuts. Dairy Processed cheese and cheese spreads. Hard cheeses. Cheese curds. Blue cheese.Feta cheese. String cheese. Regular cottage cheese. Buttermilk. Canned milk. Fats and oils Salted butter. Regular margarine. Ghee. Bacon fat. Seasonings and condiments Onion salt, garlic salt, seasoned salt, table salt, and sea salt. Canned and packaged gravies. Worcestershire sauce. Tartar sauce. Barbecue sauce. Teriyaki sauce. Soy sauce, including reduced-sodium. Steak sauce. Fish sauce. Oyster sauce. Cocktail sauce. Horseradish that you find on the shelf. Regular ketchup and mustard. Meat flavorings and tenderizers. Bouillon cubes. Hot sauce. Pre-made or packaged marinades. Pre-made or packaged taco seasonings. Relishes.Regular salad dressings. Salsa. Other foods Salted popcorn and pretzels. Corn chips and puffs. Potato and tortilla chips.Canned or dried soups. Pizza. Frozen entrees and pot pies. The items listed above may not be a complete list of foods and beverages you should avoid. Contact a dietitian for more information. Summary Eating less sodium can help lower your blood pressure, reduce swelling, and protect your heart, liver, and kidneys. Most people on this plan should limit their sodium intake to 1,500-2,000 mg (milligrams) of sodium each day. Canned, boxed, and frozen foods are high in sodium. Restaurant foods, fast foods, and pizza are also very high in sodium. You also get sodium by adding salt to food. Try to cook at home, eat more fresh fruits and vegetables, and eat less fast food and canned, processed, or prepared foods. This information is not intended to replace  advice given to you by your health care provider. Make sure you discuss any questions you have with your healthcare provider. Document Revised: 08/12/2019 Document Reviewed: 06/08/2019 Elsevier Patient Education  2022 [employer].

## 2021-02-07 NOTE — Progress Notes (Signed)
[address] [place], [place]  [address] Phone:  [phone]   Fax:  [phone]   Established Patient Office Visit  Subjective:  Patient ID: Rodney Kane, male    DOB: 30-Mar-1976  Age: 45 y.o. MRN: [id number]  CC: No chief complaint on file.   HPI Rodney Kane presents for follow up. A former patient of NP [place].    has a past medical history of Coronavirus infection (06/2020), COVID (06/2020), Diabetes mellitus without complication ([place]), Hemoglobin A1C greater than 9%, indicating poor diabetic control (12/2019), Hyperglycemia (12/2019), Hyperlipidemia (12/2019), Hypertension, Hypertensive urgency (12/2019), Noncompliance with medication regimen (12/2019), Nonischemic (hypertensive) dilated cardiomyopathy ([place]) (12/2016), Stroke ([place]), Urine ketones (12/2019), and Vitamin D deficiency (12/2019).   He reports having COVID in 12/21 and had secondary PE and Stroke.  Abdominal Pain Patient complains of abdominal pain. The pain is described as aching,  The patient is experiencing generalized pain without radiation. Onset was 4 weeks ago. Symptoms have been gradually worsening. Aggravating factors: bloating with shortness of breath .  Alleviating factors: when the fluid improves. Associated symptoms: diarrhea.The patient denies anorexia, arthralagias, chills, fever, hematochezia, hematuria, melena, nausea, and vomiting.  Edema Patient complains of edema in  left thigh swelling . The edema has been moderate. Onset of symptoms was 4 weeks ago, and patient reports symptoms have gradually improved since that time. The edema is present intermittently. The patient states the problem is long-standing. The swelling has been aggravated by dependency of involved area. The swelling has been relieved by diuretics. Associated factors include: diagnosis of heart failure and stroke. Cardiac risk factors include diabetes mellitus, dyslipidemia, hypertension,  male gender, obesity (BMI >= 30 kg/m2), and sedentary lifestyle. He has not taken his medication  Diabetes Mellitus Patient presents for follow up of diabetes. Current symptoms include: paresthesia of the feet. Symptoms have stabilized. Patient denies foot ulcerations, increased appetite, nausea, polydipsia, polyuria, visual disturbances, vomiting, and weight loss. Evaluation to date has included: hemoglobin A1C.  Home sugars: patient does not check sugars. Current treatment: Continued sulfonylurea which has been effective, Continued statin which has been effective, Continued ACE inhibitor/ARB which has been ineffective, and Continued Januvia which has been effective.  Past Medical History:  Diagnosis Date   Coronavirus infection 06/2020   COVID 06/2020   Diabetes mellitus without complication (HCC)    Hemoglobin A1C greater than 9%, indicating poor diabetic control 12/2019   Hyperglycemia 12/2019   Hyperlipidemia 12/2019   Hypertension    Hypertensive urgency 12/2019   Noncompliance with medication regimen 12/2019   Nonischemic (hypertensive) dilated cardiomyopathy ([place]) 12/2016   EF has been 30 to 35%, recently lower.   Stroke [facility])    Urine ketones 12/2019   Vitamin D deficiency 12/2019    Past Surgical History:  Procedure Laterality Date   left knee surgery     RIGHT/LEFT HEART CATH AND CORONARY ANGIOGRAPHY N/A 01/05/2017   Procedure: Right/Left Heart Cath and Coronary Angiography;  Surgeon: Leonie Man, MD;  Location: [facility];  Service: Cardiovascular; angiograph normal coronaries.  PAP mean 19 million mercury, PCWP 15 mmHg.  LVEDP 9 mmHg.  Cardiac output/index 7.84/2.7.  RAP mean 4 mmHg.   TRANSTHORACIC ECHOCARDIOGRAM  01/04/2017   EF 30 to 35%.  Moderate LVH.  Moderate severely reduced EF.  Diffuse HK.  Normal valves.   TRANSTHORACIC ECHOCARDIOGRAM  07/20/2020    Severely reduced LV function-EF 20 to 25%, global HK.  Relatively small (8  mm) spherical slightly  mobile LV thrombus noted.  Mildly reduced cardiac function.  Unable to assess PA pressures.  Moderate LA dilation.  Small pericardial effusion.  Relatively normal mitral valve and tricuspid valve.  Normal RA pressures.    Family History  Problem Relation Age of Onset   Multiple sclerosis Mother    Diabetic kidney disease Mother    Hypertension Mother     Social History   Socioeconomic History   Marital status: Divorced    Spouse name: Not on file   Number of children: 3   Years of education: Not on file   Highest education level: Not on file  Occupational History    Employer: [employer]  Tobacco Use   Smoking status: Never   Smokeless tobacco: Never  Vaping Use   Vaping Use: Never used  Substance and Sexual Activity   Alcohol use: No   Drug use: No   Sexual activity: Yes  Other Topics Concern   Not on file  Social History Narrative   Not on file   Social Determinants of Health   Financial Resource Strain: High Risk   Difficulty of Paying Living Expenses: Hard  Food Insecurity: No Food Insecurity   Worried About Running Out of Food in the Last Year: Never true   Ran Out of Food in the Last Year: Never true  Transportation Needs: No Transportation Needs   Lack of Transportation (Medical): No   Lack of Transportation (Non-Medical): No  Physical Activity: Inactive   Days of Exercise per Week: 0 days   Minutes of Exercise per Session: 0 min  Stress: Stress Concern Present   Feeling of Stress : To some extent  Social Connections: Not on file  Intimate Partner Violence: Not on file    Outpatient Medications Prior to Visit  Medication Sig Dispense Refill   albuterol (VENTOLIN HFA) 108 (90 Base) MCG/ACT inhaler Inhale 2 puffs into the lungs every 6 (six) hours as needed for wheezing or shortness of breath. (Patient not taking: Reported on 02/07/2021) 8 g 11   atorvastatin (LIPITOR) 40 MG tablet Take 1 tablet (40 mg total) by mouth daily. 90 tablet 3    cetirizine (ZYRTEC ALLERGY) 10 MG tablet Take 1 tablet (10 mg total) by mouth daily. (Patient not taking: Reported on 02/07/2021) 30 tablet 12   glipiZIDE (GLUCOTROL) 10 MG tablet Take 1 tablet (10 mg total) by mouth 2 (two) times daily before a meal. 180 tablet 3   hydrocortisone cream 1 % Apply 1 application topically 2 (two) times daily. (Patient not taking: Reported on 02/07/2021) 30 g 6   hydrOXYzine (ATARAX/VISTARIL) 10 MG tablet Take 1 tablet (10 mg total) by mouth 3 (three) times daily as needed. 90 tablet 11   losartan (COZAAR) 100 MG tablet Take 1.5 tablets (150 mg total) by mouth daily. 135 tablet 1   metoprolol succinate (TOPROL-XL) 50 MG 24 hr tablet Take 1 tablet (50 mg total) by mouth daily. Take with or immediately following a meal. 90 tablet 3   polyethylene glycol powder (GLYCOLAX/MIRALAX) 17 GM/SCOOP powder Take 17 g by mouth daily. 3350 g 1   potassium chloride SA (KLOR-CON) 20 MEQ tablet Take 1 tablet (20 mEq total) by mouth daily. 90 tablet 3   rivaroxaban (XARELTO) 20 MG TABS tablet TAKE 1 TABLET (20 MG TOTAL) BY MOUTH DAILY WITH SUPPER. 30 tablet 30   sitaGLIPtin (JANUVIA) 25 MG tablet Take 1 tablet (25 mg total) by mouth daily. 90 tablet  3   spironolactone (ALDACTONE) 25 MG tablet TAKE 1/2 TABLET (12.5 MG TOTAL) BY MOUTH DAILY. 30 tablet 3   torsemide (DEMADEX) 20 MG tablet Take 2 tablets (40 mg total) by mouth daily. 180 tablet 1   Vitamin D, Ergocalciferol, (DRISDOL) 1.25 MG (50000 UNIT) CAPS capsule Take 1 capsule (50,000 Units total) by mouth every 7 (seven) days. 5 capsule 5   No facility-administered medications prior to visit.    No Known Allergies  ROS Review of Systems    Objective:    Physical Exam HENT:     Head: Normocephalic and atraumatic.     Nose: Nose normal.     Mouth/Throat:     Mouth: Mucous membranes are moist.  Cardiovascular:     Rate and Rhythm: Tachycardia present.     Pulses: Normal pulses.     Heart sounds: Normal heart sounds.   Pulmonary:     Effort: Pulmonary effort is normal.     Breath sounds: Normal breath sounds.  Abdominal:     General: Bowel sounds are normal.     Palpations: Abdomen is soft.  Musculoskeletal:        General: Normal range of motion.     Cervical back: Normal range of motion.  Skin:    General: Skin is warm and dry.     Capillary Refill: Capillary refill takes less than 2 seconds.  Neurological:     General: No focal deficit present.     Mental Status: He is oriented to person, place, and time.  Psychiatric:        Mood and Affect: Mood normal.        Behavior: Behavior normal.        Thought Content: Thought content normal.        Judgment: Judgment normal.   BP (!) 149/108 (BP Location: Right Arm, Patient Position: Sitting, Cuff Size: Large)   Pulse (!) 113   Temp 98.1 F (36.7 C)   Ht 6' 1"  (1.854 m)   Wt (!) 307 lb 9.6 oz (139.5 kg)   SpO2 98%   BMI 40.58 kg/m  Wt Readings from Last 3 Encounters:  02/07/21 (!) 307 lb 9.6 oz (139.5 kg)  02/07/21 (!) 305 lb (138.3 kg)  01/10/21 291 lb (132 kg)     Health Maintenance Due  Topic Date Due   OPHTHALMOLOGY EXAM  Never done   Hepatitis C Screening  Never done   COLONOSCOPY (Pts 45-81yr Insurance coverage will need to be confirmed)  Never done    There are no preventive care reminders to display for this patient.  Lab Results  Component Value Date   TSH 1.520 01/10/2021   Lab Results  Component Value Date   WBC 7.1 02/02/2021   HGB 15.4 02/02/2021   HCT 48.4 02/02/2021   MCV 81.9 02/02/2021   PLT 204 02/02/2021   Lab Results  Component Value Date   NA 138 02/02/2021   K 4.6 02/02/2021   CO2 24 02/02/2021   GLUCOSE 135 (H) 02/02/2021   BUN 17 02/02/2021   CREATININE 1.49 (H) 02/02/2021   BILITOT 0.8 01/10/2021   ALKPHOS 67 01/10/2021   AST 13 01/10/2021   ALT 14 01/10/2021   PROT 6.7 01/10/2021   ALBUMIN 4.2 01/10/2021   CALCIUM 9.2 02/02/2021   ANIONGAP 10 02/02/2021   EGFR 77 01/10/2021   Lab  Results  Component Value Date   CHOL 163 01/10/2021   Lab Results  Component Value Date  HDL 57 01/10/2021   Lab Results  Component Value Date   LDLCALC 94 01/10/2021   Lab Results  Component Value Date   TRIG 63 01/10/2021   Lab Results  Component Value Date   CHOLHDL 2.9 01/10/2021   Lab Results  Component Value Date   HGBA1C 7.2 (H) 01/10/2021      Assessment & Plan:   Problem List Items Addressed This Visit       Cardiovascular and Mediastinum   Essential hypertension Uncontrolled Encouraged on going compliance with current medication regimen Encouraged home monitoring and recording BP <130/80 Eating a heart-healthy diet with less salt Encouraged regular physical activity  Recommend Weight loss     Other Visit Diagnoses     Abdominal bloating    -  Primary   Relevant Orders   Ambulatory referral to Gastroenterology   DG Abd 2 Views (Completed)   Encounter for hepatitis C screening test for low risk patient           No orders of the defined types were placed in this encounter.   Follow-up: Return in about 3 months (around 05/10/2021) for follow up [id number].    Vevelyn Francois, NP

## 2021-02-07 NOTE — Patient Instructions (Addendum)
Medication Instructions:  Stop Coreg. Start Toprol XL 50 mg (1 Tablet Daily). Start Losartan 150 mg ( 1.5 Tablet Daily). Stop Lasix. Start Torsemide 20 mg ( 2 Tablets Daily 40 mg). *If you need a refill on your cardiac medications before your next appointment, please call your pharmacy*   Lab Work: BMP, BNP Today If you have labs (blood work) drawn today and your tests are completely normal, you will receive your results only by: MyChart Message (if you have MyChart) OR A paper copy in the mail If you have any lab test that is abnormal or we need to change your treatment, we will call you to review the results.   Testing/Procedures: No Testing    Follow-Up: At [facility], you and your health needs are our priority.  As part of our continuing mission to provide you with exceptional heart care, we have created designated Provider Care Teams.  These Care Teams include your primary Cardiologist (physician) and Advanced Practice Providers (APPs -  Physician Assistants and Nurse Practitioners) who all work together to provide you with the care you need, when you need it.  We recommend signing up for the patient portal called "MyChart".  Sign up information is provided on this After Visit Summary.  MyChart is used to connect with patients for Virtual Visits (Telemedicine).  Patients are able to view lab/test results, encounter notes, upcoming appointments, etc.  Non-urgent messages can be sent to your provider as well.   To learn more about what you can do with MyChart, go to [email or web address].    Your next appointment:   2 week(s)  The format for your next appointment:   In Person  Provider:   Juanda Crumble PA-C   Other Instructions Per Victorino Dike schedule on August 3rd 12:45 pm slot. Constipation, Adult Constipation is when a person has fewer than three bowel movements in a week, has difficulty having a bowel movement, or has stools (feces) that are dry, hard, or larger than  normal. Constipation may be caused by an underlying condition. It may become worse with age if a person takes certainmedicines and does not take in enough fluids. Follow these instructions at home: Eating and drinking  Eat foods that have a lot of fiber, such as beans, whole grains, and fresh fruits and vegetables. Limit foods that are low in fiber and high in fat and processed sugars, such as fried or sweet foods. These include french fries, hamburgers, cookies, candies, and soda. Drink enough fluid to keep your urine pale yellow.  General instructions Exercise regularly or as told by your health care provider. Try to do 150 minutes of moderate exercise each week. Use the bathroom when you have the urge to go. Do not hold it in. Take over-the-counter and prescription medicines only as told by your health care provider. This includes any fiber supplements. During bowel movements: Practice deep breathing while relaxing the lower abdomen. Practice pelvic floor relaxation. Watch your condition for any changes. Let your health care provider know about them. Keep all follow-up visits as told by your health care provider. This is important. Contact a health care provider if: You have pain that gets worse. You have a fever. You do not have a bowel movement after 4 days. You vomit. You are not hungry or you lose weight. You are bleeding from the opening between the buttocks (anus). You have thin, pencil-like stools. Get help right away if: You have a fever and your symptoms suddenly get worse. You leak  stool or have blood in your stool. Your abdomen is bloated. You have severe pain in your abdomen. You feel dizzy or you faint. Summary Constipation is when a person has fewer than three bowel movements in a week, has difficulty having a bowel movement, or has stools (feces) that are dry, hard, or larger than normal. Eat foods that have a lot of fiber, such as beans, whole grains, and fresh  fruits and vegetables. Drink enough fluid to keep your urine pale yellow. Take over-the-counter and prescription medicines only as told by your health care provider. This includes any fiber supplements. This information is not intended to replace advice given to you by your health care provider. Make sure you discuss any questions you have with your healthcare provider. Document Revised: 05/25/2019 Document Reviewed: 05/25/2019 Elsevier Patient Education  2022 [employer].

## 2021-02-11 ENCOUNTER — Other Ambulatory Visit: Payer: Self-pay

## 2021-02-11 ENCOUNTER — Ambulatory Visit (INDEPENDENT_AMBULATORY_CARE_PROVIDER_SITE_OTHER): Payer: BC Managed Care – PPO | Admitting: Podiatry

## 2021-02-11 ENCOUNTER — Ambulatory Visit (INDEPENDENT_AMBULATORY_CARE_PROVIDER_SITE_OTHER): Payer: BC Managed Care – PPO

## 2021-02-11 DIAGNOSIS — M722 Plantar fascial fibromatosis: Secondary | ICD-10-CM | POA: Diagnosis not present

## 2021-02-11 DIAGNOSIS — E109 Type 1 diabetes mellitus without complications: Secondary | ICD-10-CM

## 2021-02-11 DIAGNOSIS — E0843 Diabetes mellitus due to underlying condition with diabetic autonomic (poly)neuropathy: Secondary | ICD-10-CM | POA: Diagnosis not present

## 2021-02-11 NOTE — Progress Notes (Signed)
   HPI: 45 y.o. male presenting today as a new patient referral from his PCP for evaluation of a routine diabetic foot exam as well as a lump to the plantar arch of the left foot.  Patient states that he does get some intermittent pain and tenderness to the feet bilateral.  He most recently had a stroke earlier this year and developed speech impediment/stuttering after the stroke.  He presents for further treatment and evaluation  Past Medical History:  Diagnosis Date   Coronavirus infection 06/2020   COVID 06/2020   Diabetes mellitus without complication (HCC)    Hemoglobin A1C greater than 9%, indicating poor diabetic control 12/2019   Hyperglycemia 12/2019   Hyperlipidemia 12/2019   Hypertension    Hypertensive urgency 12/2019   Noncompliance with medication regimen 12/2019   Nonischemic (hypertensive) dilated cardiomyopathy (HCC) 12/2016   EF has been 30 to 35%, recently lower.   Stroke [facility])    Urine ketones 12/2019   Vitamin D deficiency 12/2019     Physical Exam: General: The patient is alert and oriented x3 in no acute distress.  Dermatology: Skin is warm, dry and supple bilateral lower extremities. Negative for open lesions or macerations.  Vascular: Palpable pedal pulses bilaterally.  Chronic edema noted bilateral lower extremities. Capillary refill within normal limits.  Neurological: Epicritic and protective threshold diminished bilaterally.   Musculoskeletal Exam: No pedal deformities noted.  There is a very mildly symptomatic plantar fibroma to the plantar arch of the left foot about 4 cm in diameter.  Assessment: 1.  Diabetes mellitus with peripheral polyneuropathy 2.  Plantar fibroma left foot   Plan of Care:  1. Patient evaluated.  2.  Comprehensive diabetic foot exam performed today 3.  Continue good supportive shoes and insoles or slides.  Advised against going barefoot 4.  In regards to the plantar fibroma of the left foot, it is currently somewhat  asymptomatic.  Recommend conservative treatment modalities including soft cushioning insoles 5.  Continue compression socks daily.  Patient states that his son just purchased some compression hose to wear daily  6.  Return to clinic annually      Felecia Shelling, DPM [facility]  Dr. Felecia Shelling, DPM    [address] [place], [place] [address]                Office [phone]  Fax [phone]

## 2021-02-20 ENCOUNTER — Ambulatory Visit: Payer: BC Managed Care – PPO | Admitting: Physician Assistant

## 2021-02-20 NOTE — Progress Notes (Deleted)
Cardiology Office Note:    Date:  02/20/2021   ID:  Rodney, Kane 09/06/1975, MRN [id number]  PCP:  Agency, [facility] Referring MD: No ref. provider found    [facility]  Cardiologist:  [name], MD   Reason for visit: Heart failure and blood pressure follow-up  History of Present Illness:    Rodney Kane is a 45 y.o. male with a hx of diabetes, HTN, nonischemic cardiomyopathy-diagnosed 2018 in the setting of uncontrolled hypertension, preceded by URI symptoms, normal coronary arteries by cardiac catheterization, he was lost to cardiology follow-up and was not seen again until 12/21.  He was admitted for COVID-19.  He was noted to have left MCA CVA, LV thrombus, and had further decreased LVEF to 20-25%.  This was in the setting of noncompliance with his medications.  Medical management was recommended.  He was also noted to have some outpatient noncompliance issues.  He left AMA from his primary care office with severely elevated blood pressure.  He was readmitted 09/25/2020 with recurrent CVA in the context of subtherapeutic INR.   Cardiology was consulted for LV thrombus and continued severe LV dysfunction.  His echocardiogram 09/25/2020 showed LVEF less than 20%, G3 DD.  He was discharged on 09/27/2020 on Xarelto.   He went to [facility] ER on 02/02/2021 with SOB x 1-2 weeks, LE edema and cough.  He was given IV lasix and set up with cardiology follow-up.  I saw him in clinic February 07 2021.  He primarily complains of bloating which caused him to have difficulty breathing.  +PND, productive cough and weight gain.  Secondary to noncompliance with twice daily medications, I changed him to Toprol-XL 50 mg daily and losartan 150 mg daily.  With weight up 35 pounds over his dry weight, I changed him from Lasix to torsemide 40 daily.  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 - With his weight up 35 pounds over his dry weight, I will change his Lasix to torsemide 40 daily. - Check BMET and BNP today.   - I will see him back in 2 weeks.  Anticipate he will need referral to heart failure clinic and heart failure pharmacist for optimal management.  Ideally, this patient would be on [place] and either [place] or [place].  Consider rechecking 2D echo in 3 months after optimal med therapy to determine ICD candidacy (QRS <12ms).  HTN, poorly controlled - BP high this morning as patient has not taken his medications today yet - We will follow his blood pressure at next appointment with above meds adjustment.     LV thrombus - Continue Xarelto     Hyperlipidemia - LDL 94 in June 2022 - RECHECK LIPIDS - Continue Lipitor 40 mg daily     Today the patient comes to clinic alone.  He often gets frustrated by his stutter.  He states this is new post stroke.  He denies other deficits post stroke.  He states he had significant lower extremity edema before going to the ER on July 16.  This improved with IV Lasix.  His biggest complaint is bloating even after just drinking water or having a popsicle.  His bloating will cause him to have difficulty breathing.  He mentions constipation.  He takes Miralax  as needed.  He complains of productive cough.  + PND.  States he sleeps pretty flat on his side.  Denies palpitations, lightheadedness, syncope, chest pain, and bleeding.  He states he takes his medications at 3 PM.  He often does not take his Coreg or Cozaar at night secondary to nausea or visual spotting.    He states his weight went up to 312 pounds before going to the ER.  He is down to 305 by his home scale and the scale today in the office.  He believes his dry weight is somewhere around 270 pounds.  The lowest weight we have in the last 6  months is 266 pounds in March 2022.      ASCVD risk score: ***  Prior CV studies: RIGHT/LEFT HEART CATH AND CORONARY ANGIOGRAPHY 01/05/2017 Narrative  Angiographically normal coronary arteries  LV end diastolic pressure is normal - indicating adequate diuresis  Hemodynamic findings not consistent with pulmonary hypertension - with normal PCL bpm PA pressures. 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 Given his presentation with uncontrolled hypertension, most consistent with hypertensive cardiomyopathy. He has been adequately diuresed however. Blood pressures are still borderline, however he is lying flat. [name], M.D., M.S.  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   Stroke [facility])    Urine ketones 12/2019   Vitamin D deficiency 12/2019    Past Surgical History:  Procedure Laterality Date   left knee surgery     RIGHT/LEFT HEART CATH AND CORONARY ANGIOGRAPHY N/A 01/05/2017   Procedure: Right/Left Heart Cath and Coronary Angiography;  Surgeon: [name], MD;  Location: [facility] INVASIVE CV LAB;  Service: Cardiovascular; angiograph normal coronaries.  PAP mean 19 million mercury, PCWP 15 mmHg.  LVEDP 9 mmHg.  Cardiac output/index 7.84/2.7.  RAP mean 4 mmHg.   TRANSTHORACIC ECHOCARDIOGRAM  01/04/2017   EF 30 to 35%.  Moderate LVH.  Moderate severely reduced EF.  Diffuse HK.  Normal valves.   TRANSTHORACIC ECHOCARDIOGRAM  07/20/2020    Severely reduced LV function-EF 20 to 25%, global HK.  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   Mildly reduced cardiac function.  Unable to assess PA pressures.  Moderate LA dilation.  Small pericardial effusion.  Relatively normal mitral valve and tricuspid valve.  Normal RA pressures.    Current Medications: No outpatient medications have been marked as taking for the 02/20/21 encounter (Appointment) with Cannon Kettle, PA-C.     Allergies:   Patient has no known allergies.   Social History   Socioeconomic History   Marital status: Divorced    Spouse name: Not on file   Number of children: 3   Years of education: Not on file   Highest education level: Not on file  Occupational History    Employer: Research scientist (medical)  Tobacco Use   Smoking status: Never   Smokeless tobacco: Never  Vaping Use   Vaping Use: Never used  Substance and Sexual Activity   Alcohol use: No   Drug use: No   Sexual activity: Yes  Other Topics Concern  Not on file  Social History Narrative   Not on file   Social Determinants of Health   Financial Resource Strain: High Risk   Difficulty of Paying Living Expenses: Hard  Food Insecurity: No Food Insecurity   Worried About Programme researcher, broadcasting/film/video in the Last Year: Never true   Ran Out of Food in the Last Year: Never true  Transportation Needs: No Transportation Needs   Lack of Transportation (Medical): No   Lack of Transportation (Non-Medical): No  Physical Activity: Inactive   Days of Exercise per Week: 0 days   Minutes of Exercise per Session: 0 min  Stress: Stress Concern Present   Feeling of Stress : To some extent  Social Connections: Not on file     Family History: The patient's family history includes Diabetic kidney disease in his mother; Hypertension in his mother; Multiple sclerosis in his mother.  ROS:   Please see the history of present illness.     EKGs/Labs/Other Studies Reviewed:    EKG:  The ekg ordered today demonstrates ***  Recent Labs: 07/22/2020: Magnesium 2.0 01/10/2021: ALT 14; TSH 1.520 02/02/2021: B  Natriuretic Peptide 1,620.5; BUN 17; Creatinine, Ser 1.49; Hemoglobin 15.4; Platelets 204; Potassium 4.6; Sodium 138  Recent Lipid Panel    Component Value Date/Time   CHOL 163 01/10/2021 1159   TRIG 63 01/10/2021 1159   HDL 57 01/10/2021 1159   CHOLHDL 2.9 01/10/2021 1159   CHOLHDL 3.0 09/26/2020 0327   VLDL 8 09/26/2020 0327   LDLCALC 94 01/10/2021 1159    Physical Exam:    VS:  There were no vitals taken for this visit.    Wt Readings from Last 3 Encounters:  02/07/21 (!) 307 lb 9.6 oz (139.5 kg)  02/07/21 (!) 305 lb (138.3 kg)  01/10/21 291 lb (132 kg)     GEN: *** Well nourished, well developed in no acute distress HEENT: Normal NECK: No JVD; No carotid bruits CARDIAC: ***RRR, no murmurs, rubs, gallops RESPIRATORY:  Clear to auscultation without rales, wheezing or rhonchi  ABDOMEN: Soft, non-tender, non-distended MUSCULOSKELETAL: No edema; No deformity  SKIN: Warm and dry NEUROLOGIC:  Alert and oriented PSYCHIATRIC:  Normal affect   ASSESSMENT AND PLAN   ***   {Are you ordering a CV Procedure (e.g. stress test, cath, DCCV, TEE, etc)?   Press F2        :[id number]}    Medication Adjustments/Labs and Tests Ordered: Current medicines are reviewed at length with the patient today.  Concerns regarding medicines are outlined above.  No orders of the defined types were placed in this encounter.  No orders of the defined types were placed in this encounter.   There are no Patient Instructions on file for this visit.   Signed, Cannon Kettle, PA-C  02/20/2021 9:36 AM    [facility]

## 2021-03-26 ENCOUNTER — Telehealth: Payer: Self-pay | Admitting: Licensed Clinical Social Worker

## 2021-03-26 ENCOUNTER — Telehealth: Payer: Self-pay | Admitting: Adult Health

## 2021-03-26 NOTE — Telephone Encounter (Signed)
Called patient to discuss, no answer and no voice mail available.

## 2021-03-26 NOTE — Telephone Encounter (Signed)
Patient needs a letter regarding his condition for child support court. He would like for you to call him back.

## 2021-03-26 NOTE — Telephone Encounter (Signed)
LCSW received a call from pt [phone]). Pt shares that he has been doing okay; still struggles with his stutter. Is able to tell me that he has an upcoming court date for child support. He is inquiring if we can assist with a letter to bring to that detailing his current medical conditions. LCSW shared that it would be best for pt to contact the nursing line to make that request but that I can reach out to PA at [facility] and PCP office LCSW to see if they can provide such. I will leave pt a list of provider office numbers at front desk tomorrow that will have PCP/Heartcare/Neurology office contact numbers. I have sent a in basket to Juanda Crumble, [place], to see if she would be able to complete a letter w/ pt medical dx and current treatments. No legal advice to be provided. I have also reached out to [name], at [facility] who shared that the office should be able to complete one with 2 weeks processing time- I have noted this next to their number.   Remain available as needed.  Octavio Graves, MSW, LCSW [facility] Navigation  [phone]

## 2021-03-28 ENCOUNTER — Telehealth: Payer: Self-pay

## 2021-03-28 NOTE — Telephone Encounter (Signed)
Called patient who stated he's been out of work since Dec. He reported other factors of  Congestive heart failure, swelling in bodyand legs, dizziness, shortness of breath when swelling occurs. He stated I" know what I'm trying to say but forget about what I'm saying". He stated he is going to all his PCP today to discuss a letter form her as well. I advised will let NP know and call him back. Patient verbalized understanding, appreciation.

## 2021-03-28 NOTE — Telephone Encounter (Signed)
Pt called in and said he needs a form for court date on 04/04/21 about his condition (DUE to child support)  You have NO appt's before then!

## 2021-03-28 NOTE — Telephone Encounter (Signed)
Letter composed and signed by Gae Gallop, NP. I called patient and informed him. The call was cut off, attempted to reach him again but no answer. If he calls back phone staff should ask for his e mail address.

## 2021-03-28 NOTE — Telephone Encounter (Signed)
Okay to provide a letter stating residual stroke deficits of expressive aphasia which can interfere with ability to return to work. Due to other multiple comorbidities and health conditions, he will likely have great difficulty adequately performing majority of job functions

## 2021-03-28 NOTE — Telephone Encounter (Signed)
When he was previously seen, he did have residual mild aphasia but we did not specifically discuss working. Even with mild aphasia, he should still be able to work. Are there other concerns that he has limiting his ability to work?

## 2021-03-28 NOTE — Telephone Encounter (Signed)
Spoke with patient who stated his child support lawyer is asking for a letter stating what is wrong with and why he can't work due to 2 strokes. He doesn't have income for child support. I advised him I'll send to NP and let him know. Patient verbalized understanding, appreciation.

## 2021-03-29 ENCOUNTER — Encounter: Payer: Self-pay | Admitting: Physician Assistant

## 2021-03-29 NOTE — Progress Notes (Signed)
     To whom this may concern,   Rodney Kane is a 45 y.o. male with a hx of diabetes, HTN, nonischemic cardiomyopathy-diagnosed 2018 in the setting of uncontrolled hypertension, preceded by URI symptoms, normal coronary arteries by cardiac catheterization.  He was admitted for COVID-19.  He was noted to have left MCA CVA (stroke), LV thrombus, and had further decreased LVEF to 20-25%.  He was readmitted 09/25/2020 with recurrent CVA in the context of subtherapeutic INR.   Cardiology was consulted for LV thrombus and continued severe LV dysfunction.  His echocardiogram 09/25/2020 showed LVEF less than 20%.   Post stroke, patient continues with a very significant stutter making his speech very difficult to understand.    Because of his medical conditions, it would be very difficult to maintain a full-time job.  He has heart failure which can often flare and require repeat hospital admissions.  With his stutter, he would be unlikely to do any job that requires communication.    Thank you for your understanding in this matter.  Signed, Cannon Kettle, PA-C  03/29/2021   [facility]

## 2021-03-29 NOTE — Progress Notes (Signed)
     To whom this may concern,   Rodney Kane is a 45 y.o. male with a hx of diabetes, HTN, nonischemic cardiomyopathy-diagnosed 2018 in the setting of uncontrolled hypertension, preceded by URI symptoms, normal coronary arteries by cardiac catheterization.  He was admitted for COVID-19.  He was noted to have left MCA CVA (stroke), LV thrombus, and had further decreased LVEF to 20-25%.  He was readmitted 09/25/2020 with recurrent CVA in the context of subtherapeutic INR.   Cardiology was consulted for LV thrombus and continued severe LV dysfunction.  His echocardiogram 09/25/2020 showed LVEF less than 20%.   Post stroke, patient continues with a very significant stutter making his speech very difficult to understand.    Because of his medical conditions, it would be very difficult to maintain a full-time job.  He has heart failure which can often flare and require repeat hospital admissions.  With his stutter, he would be unlikely to do any job that requires communication.    Thank you for your understanding in this matter.  Signed, Izamar Linden K Tameron Lama, PA-C  03/29/2021  [facility]   

## 2021-03-29 NOTE — Telephone Encounter (Signed)
Attempted to contact patient, line just rang no option to leave voicemail.   

## 2021-04-01 NOTE — Telephone Encounter (Signed)
Patient called office. He will pick up letter. Letter placed at front desk.

## 2021-04-01 NOTE — Telephone Encounter (Signed)
Called patient to get his e mail address. No answer and phone continuously rang, no VMB.

## 2021-04-23 NOTE — Progress Notes (Deleted)
Cardiology Office Note:    Date:  04/23/2021   ID:  Rodney, Kane [birth year], MRN [id number]  PCP:  Agency, [facility] Cardiologist: [name], MD   Reason for visit: Follow-up  History of Present Illness:    Rodney Kane is a 45 y.o. male with a hx of of diabetes with associated neuropathy, HTN, nonischemic cardiomyopathy-diagnosed 2018 in the setting of uncontrolled hypertension, preceded by URI symptoms, normal coronary arteries by cardiac catheterization, he was lost to cardiology follow-up and was not seen again until 12/21.  He was admitted for COVID-19.  He was noted to have left MCA CVA, LV thrombus, and had further decreased LVEF to 20-25%.  This was in the setting of noncompliance with his medications.  Medical management was recommended. He left AMA from his primary care office with severely elevated blood pressure.  He was readmitted 09/25/2020 with recurrent CVA in the context of subtherapeutic INR.   Cardiology was consulted for LV thrombus and continued severe LV dysfunction.  His echocardiogram 09/25/2020 showed LVEF less than 20%, G3 DD.  He was discharged on 09/27/2020 on Xarelto.    He went to [facility] ER on 02/02/2021 with SOB x 1-2 weeks, LE edema and cough.  He was given IV lasix and set up with cardiology follow-up.   He last saw me on February 07, 2021.  He often gets frustrated by his stutter (new post CVA).  At this visit he complained about bloating, productive cough, PND.  He states he takes his medications at 3 PM.  He often does not take his Coreg or Cozaar at night secondary to nausea or visual spotting.    He states his weight went up to 312 pounds before going to the ER.  He is down to 305 by his home scale and the scale today in the office.  He believes his dry weight is somewhere around 270 pounds.  The lowest weight we have in the last 6 months is 266 pounds in March 2022.  At his appointment  in July, he was changed to once daily medications.  With his weight up 35 pounds from his dry weight, his Lasix was changed to torsemide.  [facility] on July 31 with a complaint of not being able to see objects when he is outside in the bright light.  He stated he can only see the outline of objects and no distinct features.  He did have borderline blood pressure in the ER with BP 98/72 on standing and mild creatinine bump.  For past postural hypotension he was given 500 cc of normal saline and d/c home.  Today, ***.    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  Ideally, this patient would be on [place] and either [place] or [place].  Consider rechecking 2D echo in 3 months after optimal med therapy to determine ICD candidacy (QRS <176ms).   LV thrombus -Continue Xarelto     Hyperlipidemia - LDL 94 in June 2022 - Continue Lipitor 40 mg daily   HTN, poorly controlled - BP high this morning as patient has not taken his medications today yet - We will follow his blood pressure at next appointment with above meds adjustment.     Disposition: Follow-up in ***.  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   Stroke [facility])    Urine ketones 12/2019   Vitamin D deficiency 12/2019    Past Surgical History:  Procedure Laterality Date   left knee surgery      RIGHT/LEFT HEART CATH AND CORONARY ANGIOGRAPHY N/A 01/05/2017   Procedure: Right/Left Heart Cath and Coronary Angiography;  Surgeon: [name], MD;  Location: [facility] INVASIVE CV LAB;  Service: Cardiovascular; angiograph normal coronaries.  PAP mean 19 million mercury, PCWP 15 mmHg.  LVEDP 9 mmHg.  Cardiac output/index 7.84/2.7.  RAP mean 4 mmHg.   TRANSTHORACIC ECHOCARDIOGRAM  01/04/2017   EF 30 to 35%.  Moderate LVH.  Moderate severely reduced EF.  Diffuse HK.  Normal valves.   TRANSTHORACIC ECHOCARDIOGRAM  07/20/2020    Severely reduced LV function-EF 20 to 25%, global HK.  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  Mildly reduced cardiac function.  Unable to assess PA pressures.  Moderate LA dilation.  Small pericardial effusion.  Relatively normal mitral valve and tricuspid valve.  Normal RA pressures.    Current Medications: No outpatient medications have been marked as taking for the 04/24/21 encounter (Appointment) with Cannon Kettle, PA-C.     Allergies:   Patient has no known allergies.   Social History   Socioeconomic History   Marital status: Divorced    Spouse name: Not on file   Number of children: 3   Years of education: Not on file   Highest education level: Not on file  Occupational History    Employer: Research scientist (medical)  Tobacco Use   Smoking status: Never   Smokeless tobacco: Never  Vaping Use   Vaping Use: Never used  Substance and Sexual Activity   Alcohol use: No   Drug use: No   Sexual activity: Yes  Other Topics Concern   Not on file  Social History Narrative   Not on file   Social Determinants of Health   Financial Resource Strain: High Risk   Difficulty of Paying Living Expenses: Hard  Food Insecurity: No Food Insecurity   Worried About Running Out of Food in the Last Year: Never true   Ran Out of Food in the Last Year: Never true  Transportation Needs: No Transportation Needs   Lack of Transportation  (Medical): No   Lack of Transportation (Non-Medical): No  Physical Activity: Inactive   Days of Exercise per Week: 0 days   Minutes of Exercise per Session: 0 min  Stress: Stress Concern Present   Feeling of Stress : To some extent  Social Connections: Not on file     Family History: The patient's family history includes Diabetic kidney disease in his mother; Hypertension in his mother; Multiple sclerosis in his mother.  ROS:   Please see the history of present illness.     EKGs/Labs/Other Studies Reviewed:    EKG:  The ekg ordered today demonstrates ***  Recent Labs: 07/22/2020: Magnesium 2.0 01/10/2021: ALT 14; TSH 1.520 02/02/2021: B Natriuretic Peptide 1,620.5; BUN 17; Creatinine, Ser 1.49; Hemoglobin 15.4; Platelets  204; Potassium 4.6; Sodium 138   Recent Lipid Panel Lab Results  Component Value Date/Time   CHOL 163 01/10/2021 11:59 AM   TRIG 63 01/10/2021 11:59 AM   HDL 57 01/10/2021 11:59 AM   LDLCALC 94 01/10/2021 11:59 AM    Physical Exam:    VS:  There were no vitals taken for this visit.   No data found.  Wt Readings from Last 3 Encounters:  02/07/21 (!) 307 lb 9.6 oz (139.5 kg)  02/07/21 (!) 305 lb (138.3 kg)  01/10/21 291 lb (132 kg)     GEN: *** Well nourished, well developed in no acute distress HEENT: Normal NECK: No JVD; No carotid bruits CARDIAC: ***RRR, no murmurs, rubs, gallops RESPIRATORY:  Clear to auscultation without rales, wheezing or rhonchi  ABDOMEN: Soft, non-tender, non-distended MUSCULOSKELETAL: No edema; No deformity  SKIN: Warm and dry NEUROLOGIC:  Alert and oriented PSYCHIATRIC:  Normal affect   ASSESSMENT AND PLAN   ***   {Are you ordering a CV Procedure (e.g. stress test, cath, DCCV, TEE, etc)?   Press F2        :[id number]}    Medication Adjustments/Labs and Tests Ordered: Current medicines are reviewed at length with the patient today.  Concerns regarding medicines are outlined above.  No orders of the defined types  were placed in this encounter.  No orders of the defined types were placed in this encounter.   There are no Patient Instructions on file for this visit.   Signed, Cannon Kettle, PA-C  04/23/2021 8:57 AM    [facility]

## 2021-04-24 ENCOUNTER — Ambulatory Visit: Payer: BC Managed Care – PPO | Admitting: Physician Assistant

## 2021-04-24 DIAGNOSIS — E785 Hyperlipidemia, unspecified: Secondary | ICD-10-CM

## 2021-04-24 DIAGNOSIS — Z7901 Long term (current) use of anticoagulants: Secondary | ICD-10-CM

## 2021-04-24 DIAGNOSIS — Z79899 Other long term (current) drug therapy: Secondary | ICD-10-CM

## 2021-04-24 DIAGNOSIS — I428 Other cardiomyopathies: Secondary | ICD-10-CM

## 2021-04-24 DIAGNOSIS — I5043 Acute on chronic combined systolic (congestive) and diastolic (congestive) heart failure: Secondary | ICD-10-CM

## 2021-04-24 DIAGNOSIS — I513 Intracardiac thrombosis, not elsewhere classified: Secondary | ICD-10-CM

## 2021-04-24 DIAGNOSIS — I10 Essential (primary) hypertension: Secondary | ICD-10-CM

## 2021-05-02 ENCOUNTER — Telehealth: Payer: Self-pay | Admitting: Adult Health

## 2021-05-02 ENCOUNTER — Telehealth: Payer: Self-pay | Admitting: Cardiology

## 2021-05-02 ENCOUNTER — Encounter: Payer: Self-pay | Admitting: Adult Health

## 2021-05-02 NOTE — Telephone Encounter (Signed)
Called patient, daughter [name] answered, she is on [place]. She stated she can contact him with information. I advised her whichever MD took him out of work would release him to return. He needs to check with his employer for specific requirements to be able to return. He will need to provide his job duties as well. I advised Shanda Bumps can possibly assist with a letter if needed. Patient has FU with her next Thurs., and I reminded daughter. She  verbalized understanding, appreciation.

## 2021-05-02 NOTE — Telephone Encounter (Signed)
Pt would like to return to work and needs to speak with Shanda Bumps about that.

## 2021-05-02 NOTE — Telephone Encounter (Signed)
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 Per his report, he has been out of work about 10 months, since his stroke. He does not recall what provider took him out of work, or if someone did. He said he was told to follow up with cardiology after his stroke. He is a Haematologist.   Routed to MD/RN to advise on ability to return to work

## 2021-05-04 NOTE — Telephone Encounter (Signed)
I was not the one who took him out of work -- if it was as a result of his CVA - would suggest talking to PCP or Neurology.  [name], MD

## 2021-05-07 NOTE — Telephone Encounter (Signed)
Phone rings busy will try again

## 2021-05-08 NOTE — Telephone Encounter (Signed)
Called phone rings x 8  no answer , no voice mail

## 2021-05-10 ENCOUNTER — Other Ambulatory Visit: Payer: Self-pay

## 2021-05-10 ENCOUNTER — Ambulatory Visit (INDEPENDENT_AMBULATORY_CARE_PROVIDER_SITE_OTHER): Payer: BC Managed Care – PPO | Admitting: Nurse Practitioner

## 2021-05-10 VITALS — BP 128/87 | HR 98 | Temp 97.3°F | Ht 73.0 in | Wt 305.0 lb

## 2021-05-10 DIAGNOSIS — E118 Type 2 diabetes mellitus with unspecified complications: Secondary | ICD-10-CM | POA: Diagnosis not present

## 2021-05-10 DIAGNOSIS — R0602 Shortness of breath: Secondary | ICD-10-CM

## 2021-05-10 DIAGNOSIS — Z8616 Personal history of COVID-19: Secondary | ICD-10-CM

## 2021-05-10 DIAGNOSIS — R7309 Other abnormal glucose: Secondary | ICD-10-CM

## 2021-05-10 DIAGNOSIS — R8290 Unspecified abnormal findings in urine: Secondary | ICD-10-CM | POA: Diagnosis not present

## 2021-05-10 DIAGNOSIS — L299 Pruritus, unspecified: Secondary | ICD-10-CM

## 2021-05-10 DIAGNOSIS — I10 Essential (primary) hypertension: Secondary | ICD-10-CM | POA: Diagnosis not present

## 2021-05-10 DIAGNOSIS — R739 Hyperglycemia, unspecified: Secondary | ICD-10-CM | POA: Diagnosis not present

## 2021-05-10 LAB — POCT URINALYSIS DIP (CLINITEK)
Glucose, UA: NEGATIVE mg/dL
Leukocytes, UA: NEGATIVE
Nitrite, UA: NEGATIVE
POC PROTEIN,UA: 30 — AB
Spec Grav, UA: >=1.03 — AB (ref 1.010–1.025)
Urobilinogen, UA: 0.2 E.U./dL
pH, UA: 5.5 (ref 5.0–8.0)

## 2021-05-10 LAB — POCT GLYCOSYLATED HEMOGLOBIN (HGB A1C)
HbA1c POC (<> result, manual entry): 7.5 % (ref 4.0–5.6)
HbA1c, POC (controlled diabetic range): 7.5 % — AB (ref 0.0–7.0)
HbA1c, POC (prediabetic range): 7.5 % — AB (ref 5.7–6.4)
Hemoglobin A1C: 7.5 % — AB (ref 4.0–5.6)

## 2021-05-10 LAB — GLUCOSE, POCT (MANUAL RESULT ENTRY): POC Glucose: 148 mg/dl — AB (ref 70–99)

## 2021-05-10 MED ORDER — HYDROCORTISONE 1 % EX CREA: 1.0000 "application " | TOPICAL_CREAM | Freq: Two times a day (BID) | CUTANEOUS | 6 refills | Status: DC

## 2021-05-10 MED ORDER — GLIPIZIDE 10 MG PO TABS: 10.0000 mg | ORAL_TABLET | Freq: Two times a day (BID) | ORAL | 3 refills | Status: DC

## 2021-05-10 MED ORDER — SITAGLIPTIN PHOSPHATE 25 MG PO TABS: 25.0000 mg | ORAL_TABLET | Freq: Every day | ORAL | 3 refills | Status: DC

## 2021-05-10 NOTE — Progress Notes (Signed)
[place] [address] [place], [place]  [address] Phone:  [phone]   Fax:  [phone]   Established Patient Office Visit  Subjective:  Patient ID: Rodney Kane, male    DOB: 04/05/1976  Age: 45 y.o. MRN: [id number]  CC:  Chief Complaint  Patient presents with   Follow-up    3 month follow up; needing a note to go back to work has been out since beginning of year since having stroke and covid.     HPI Rodney Kane presents for follow up.  He  has a past medical history of Coronavirus infection (06/2020), COVID (06/2020), Diabetes mellitus without complication ([place]), Hemoglobin A1C greater than 9%, indicating poor diabetic control (12/2019), Hyperglycemia (12/2019), Hyperlipidemia (12/2019), Hypertension, Hypertensive urgency (12/2019), Noncompliance with medication regimen (12/2019), Nonischemic (hypertensive) dilated cardiomyopathy ([place]) (12/2016), Stroke ([place]), Urine ketones (12/2019), and Vitamin D deficiency (12/2019).   He is monitoring his BP he reports that the BP is normal . He does not check his DM. He reports being compliant with his medication. He is ready to return to work; Loss adjuster, chartered. He feels like all of his symptoms and diagnoses were due to [place]. Denies headache, dizziness, visual changes, shortness of breath, dyspnea on exertion, chest pain, nausea, vomiting or any edema. He is trying to avoid food with sodium. He is eating healthier. He has had a 2 pound weight loss.   Past Medical History:  Diagnosis Date   Coronavirus infection 06/2020   COVID 06/2020   Diabetes mellitus without complication (HCC)    Hemoglobin A1C greater than 9%, indicating poor diabetic control 12/2019   Hyperglycemia 12/2019   Hyperlipidemia 12/2019   Hypertension    Hypertensive urgency 12/2019   Noncompliance with medication regimen 12/2019   Nonischemic (hypertensive) dilated cardiomyopathy ([place]) 12/2016   EF has been 30 to 35%, recently  lower.   Stroke [facility])    Urine ketones 12/2019   Vitamin D deficiency 12/2019    Past Surgical History:  Procedure Laterality Date   left knee surgery     RIGHT/LEFT HEART CATH AND CORONARY ANGIOGRAPHY N/A 01/05/2017   Procedure: Right/Left Heart Cath and Coronary Angiography;  Surgeon: Leonie Man, MD;  Location: [facility];  Service: Cardiovascular; angiograph normal coronaries.  PAP mean 19 million mercury, PCWP 15 mmHg.  LVEDP 9 mmHg.  Cardiac output/index 7.84/2.7.  RAP mean 4 mmHg.   TRANSTHORACIC ECHOCARDIOGRAM  01/04/2017   EF 30 to 35%.  Moderate LVH.  Moderate severely reduced EF.  Diffuse HK.  Normal valves.   TRANSTHORACIC ECHOCARDIOGRAM  07/20/2020    Severely reduced LV function-EF 20 to 25%, global HK.  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  Mildly reduced cardiac function.  Unable to assess PA pressures.  Moderate LA dilation.  Small pericardial effusion.  Relatively normal mitral valve and tricuspid valve.  Normal RA pressures.    Family History  Problem Relation Age of Onset   Multiple sclerosis Mother    Diabetic kidney disease Mother    Hypertension Mother     Social History   Socioeconomic History   Marital status: Divorced    Spouse name: Not on file   Number of children: 3   Years of education: Not on file   Highest education level: Not on file  Occupational History    Employer: [employer]  Tobacco Use   Smoking status: Never   Smokeless tobacco:  Never  Vaping Use   Vaping Use: Never used  Substance and Sexual Activity   Alcohol use: No   Drug use: No   Sexual activity: Yes  Other Topics Concern   Not on file  Social History Narrative   Not on file   Social Determinants of Health   Financial Resource Strain: High Risk   Difficulty of Paying Living Expenses: Hard  Food Insecurity: No Food Insecurity   Worried About Running Out of Food in the Last Year: Never true   Ran Out of Food in the  Last Year: Never true  Transportation Needs: No Transportation Needs   Lack of Transportation (Medical): No   Lack of Transportation (Non-Medical): No  Physical Activity: Inactive   Days of Exercise per Week: 0 days   Minutes of Exercise per Session: 0 min  Stress: Stress Concern Present   Feeling of Stress : To some extent  Social Connections: Not on file  Intimate Partner Violence: Not on file    Outpatient Medications Prior to Visit  Medication Sig Dispense Refill   atorvastatin (LIPITOR) 40 MG tablet Take 1 tablet (40 mg total) by mouth daily. 90 tablet 3   hydrOXYzine (ATARAX/VISTARIL) 10 MG tablet Take 1 tablet (10 mg total) by mouth 3 (three) times daily as needed. 90 tablet 11   losartan (COZAAR) 100 MG tablet Take 1.5 tablets (150 mg total) by mouth daily. 135 tablet 1   polyethylene glycol powder (GLYCOLAX/MIRALAX) 17 GM/SCOOP powder Take 17 g by mouth daily. 3350 g 1   potassium chloride SA (KLOR-CON) 20 MEQ tablet Take 1 tablet (20 mEq total) by mouth daily. 90 tablet 3   rivaroxaban (XARELTO) 20 MG TABS tablet TAKE 1 TABLET (20 MG TOTAL) BY MOUTH DAILY WITH SUPPER. 30 tablet 30   spironolactone (ALDACTONE) 25 MG tablet TAKE 1/2 TABLET (12.5 MG TOTAL) BY MOUTH DAILY. 30 tablet 3   glipiZIDE (GLUCOTROL) 10 MG tablet Take 1 tablet (10 mg total) by mouth 2 (two) times daily before a meal. 180 tablet 3   hydrocortisone cream 1 % Apply 1 application topically 2 (two) times daily. 30 g 6   sitaGLIPtin (JANUVIA) 25 MG tablet Take 1 tablet (25 mg total) by mouth daily. 90 tablet 3   metoprolol succinate (TOPROL-XL) 50 MG 24 hr tablet Take 1 tablet (50 mg total) by mouth daily. Take with or immediately following a meal. 90 tablet 3   torsemide (DEMADEX) 20 MG tablet Take 2 tablets (40 mg total) by mouth daily. 180 tablet 1   albuterol (VENTOLIN HFA) 108 (90 Base) MCG/ACT inhaler Inhale 2 puffs into the lungs every 6 (six) hours as needed for wheezing or shortness of breath. (Patient not  taking: No sig reported) 8 g 11   cetirizine (ZYRTEC ALLERGY) 10 MG tablet Take 1 tablet (10 mg total) by mouth daily. (Patient not taking: No sig reported) 30 tablet 12   Vitamin D, Ergocalciferol, (DRISDOL) 1.25 MG (50000 UNIT) CAPS capsule Take 1 capsule (50,000 Units total) by mouth every 7 (seven) days. 5 capsule 5   No facility-administered medications prior to visit.    No Known Allergies  ROS Review of Systems    Objective:    Physical Exam HENT:     Head: Normocephalic and atraumatic.     Nose: Nose normal.     Mouth/Throat:     Mouth: Mucous membranes are moist.  Cardiovascular:     Rate and Rhythm: Tachycardia present.     Pulses:  Normal pulses.     Heart sounds: Normal heart sounds.  Pulmonary:     Effort: Pulmonary effort is normal.     Breath sounds: Normal breath sounds.  Abdominal:     General: Bowel sounds are normal.     Palpations: Abdomen is soft.  Musculoskeletal:        General: Normal range of motion.     Cervical back: Normal range of motion.     Right lower leg: No edema.     Left lower leg: No edema.  Skin:    General: Skin is warm and dry.     Capillary Refill: Capillary refill takes less than 2 seconds.  Neurological:     General: No focal deficit present.     Mental Status: He is oriented to person, place, and time.  Psychiatric:        Mood and Affect: Mood normal.        Behavior: Behavior normal.        Thought Content: Thought content normal.        Judgment: Judgment normal.    BP 128/87   Pulse 98   Temp (!) 97.3 F (36.3 C)   Ht _0  (1.854 m)   Wt (!) 305 lb (138.3 kg)   SpO2 100%   BMI 40.24 kg/m  Wt Readings from Last 3 Encounters:  05/10/21 (!) 305 lb (138.3 kg)  02/07/21 (!) 307 lb 9.6 oz (139.5 kg)  02/07/21 (!) 305 lb (138.3 kg)     There are no preventive care reminders to display for this patient.   There are no preventive care reminders to display for this patient.  Lab Results  Component Value Date    TSH 1.520 01/10/2021   Lab Results  Component Value Date   WBC 7.1 02/02/2021   HGB 15.4 02/02/2021   HCT 48.4 02/02/2021   MCV 81.9 02/02/2021   PLT 204 02/02/2021   Lab Results  Component Value Date   NA 138 02/02/2021   K 4.6 02/02/2021   CO2 24 02/02/2021   GLUCOSE 135 (H) 02/02/2021   BUN 17 02/02/2021   CREATININE 1.49 (H) 02/02/2021   BILITOT 0.8 01/10/2021   ALKPHOS 67 01/10/2021   AST 13 01/10/2021   ALT 14 01/10/2021   PROT 6.7 01/10/2021   ALBUMIN 4.2 01/10/2021   CALCIUM 9.2 02/02/2021   ANIONGAP 10 02/02/2021   EGFR 77 01/10/2021   Lab Results  Component Value Date   CHOL 163 01/10/2021   Lab Results  Component Value Date   HDL 57 01/10/2021   Lab Results  Component Value Date   LDLCALC 94 01/10/2021   Lab Results  Component Value Date   TRIG 63 01/10/2021   Lab Results  Component Value Date   CHOLHDL 2.9 01/10/2021   Lab Results  Component Value Date   HGBA1C 7.5 (A) 05/10/2021   HGBA1C 7.5 05/10/2021   HGBA1C 7.5 (A) 05/10/2021   HGBA1C 7.5 (A) 05/10/2021      Assessment & Plan:   Problem List Items Addressed This Visit       Cardiovascular and Mediastinum   Essential hypertension - Primary Encouraged on going compliance with current medication regimen Encouraged home monitoring and recording BP <130/80 Eating a heart-healthy diet with less salt Encouraged regular physical activity  Recommend Weight loss   Relevant Orders   POCT URINALYSIS DIP (CLINITEK) (Completed)     Other   Hyperglycemia   Relevant Medications   glipiZIDE (  GLUCOTROL) 10 MG tablet   sitaGLIPtin (JANUVIA) 25 MG tablet   Other Visit Diagnoses     Type 2 diabetes mellitus with complication, without long-term current use of insulin (HCC)    Encourage compliance with current treatment regimen   Encourage regular CBG monitoring Encourage contacting office if excessive hyperglycemia and or hypoglycemia Lifestyle modification with healthy diet (fewer  calories, more high fiber foods, whole grains and non-starchy vegetables, lower fat meat and fish, low-fat diary include healthy oils) regular exercise (physical activity) and weight loss Opthalmology exam discussed TBS when back to work Regular dental visits encouraged Home BP monitoring also encouraged goal <130/80     Relevant Medications   glipiZIDE (GLUCOTROL) 10 MG tablet   sitaGLIPtin (JANUVIA) 25 MG tablet   Other Relevant Orders   POCT URINALYSIS DIP (CLINITEK) (Completed)   HgB A1c (Completed)   Glucose (CBG) (Completed)   Abnormal urinalysis       Relevant Orders   Urine Culture   Hemoglobin A1C greater than 9%, indicating poor diabetic control       Relevant Medications   glipiZIDE (GLUCOTROL) 10 MG tablet   sitaGLIPtin (JANUVIA) 25 MG tablet   Itchy skin   Stable      Relevant Medications   hydrocortisone cream 1 %   Shortness of breath     Resolved   Personal history of COVID-19 Return to work note provided    Meds ordered this encounter  Medications   glipiZIDE (GLUCOTROL) 10 MG tablet    Sig: Take 1 tablet (10 mg total) by mouth 2 (two) times daily before a meal.    Dispense:  180 tablet    Refill:  3    Order Specific Question:   Supervising Provider    Answer:   Tresa Garter [[id number]]   sitaGLIPtin (JANUVIA) 25 MG tablet    Sig: Take 1 tablet (25 mg total) by mouth daily.    Dispense:  90 tablet    Refill:  3    Order Specific Question:   Supervising Provider    Answer:   Tresa Garter [id number]   hydrocortisone cream 1 %    Sig: Apply 1 application topically 2 (two) times daily.    Dispense:  30 g    Refill:  6    Order Specific Question:   Supervising Provider    Answer:   Tresa Garter [id number]    Follow-up: Return in about 3 months (around 08/10/2021) for follow up [id number].    Vevelyn Francois, NP

## 2021-05-10 NOTE — Patient Instructions (Signed)
Diabetes Mellitus and Nutrition, Adult When you have diabetes, or diabetes mellitus, it is very important to have healthy eating habits because your blood sugar (glucose) levels are greatly affected by what you eat and drink. Eating healthy foods in the right amounts, at about the same times every day, can help you:  Control your blood glucose.  Lower your risk of heart disease.  Improve your blood pressure.  Reach or maintain a healthy weight. What can affect my meal plan? Every person with diabetes is different, and each person has different needs for a meal plan. Your health care provider may recommend that you work with a dietitian to make a meal plan that is best for you. Your meal plan may vary depending on factors such as:  The calories you need.  The medicines you take.  Your weight.  Your blood glucose, blood pressure, and cholesterol levels.  Your activity level.  Other health conditions you have, such as heart or kidney disease. How do carbohydrates affect me? Carbohydrates, also called carbs, affect your blood glucose level more than any other type of food. Eating carbs naturally raises the amount of glucose in your blood. Carb counting is a method for keeping track of how many carbs you eat. Counting carbs is important to keep your blood glucose at a healthy level, especially if you use insulin or take certain oral diabetes medicines. It is important to know how many carbs you can safely have in each meal. This is different for every person. Your dietitian can help you calculate how many carbs you should have at each meal and for each snack. How does alcohol affect me? Alcohol can cause a sudden decrease in blood glucose (hypoglycemia), especially if you use insulin or take certain oral diabetes medicines. Hypoglycemia can be a life-threatening condition. Symptoms of hypoglycemia, such as sleepiness, dizziness, and confusion, are similar to symptoms of having too much  alcohol.  Do not drink alcohol if: ? Your health care provider tells you not to drink. ? You are pregnant, may be pregnant, or are planning to become pregnant.  If you drink alcohol: ? Do not drink on an empty stomach. ? Limit how much you use to:  0-1 drink a day for women.  0-2 drinks a day for men. ? Be aware of how much alcohol is in your drink. In the [place] one drink equals one 12 oz bottle of beer (355 mL), one 5 oz glass of wine (148 mL), or one 1 oz glass of hard liquor (44 mL). ? Keep yourself hydrated with water, diet soda, or unsweetened iced tea.  Keep in mind that regular soda, juice, and other mixers may contain a lot of sugar and must be counted as carbs. What are tips for following this plan? Reading food labels  Start by checking the serving size on the "Nutrition Facts" label of packaged foods and drinks. The amount of calories, carbs, fats, and other nutrients listed on the label is based on one serving of the item. Many items contain more than one serving per package.  Check the total grams (g) of carbs in one serving. You can calculate the number of servings of carbs in one serving by dividing the total carbs by 15. For example, if a food has 30 g of total carbs per serving, it would be equal to 2 servings of carbs.  Check the number of grams (g) of saturated fats and trans fats in one serving. Choose foods that have   a low amount or none of these fats.  Check the number of milligrams (mg) of salt (sodium) in one serving. Most people should limit total sodium intake to less than 2,300 mg per day.  Always check the nutrition information of foods labeled as "low-fat" or "nonfat." These foods may be higher in added sugar or refined carbs and should be avoided.  Talk to your dietitian to identify your daily goals for nutrients listed on the label. Shopping  Avoid buying canned, pre-made, or processed foods. These foods tend to be high in fat, sodium, and added  sugar.  Shop around the outside edge of the grocery store. This is where you will most often find fresh fruits and vegetables, bulk grains, fresh meats, and fresh dairy. Cooking  Use low-heat cooking methods, such as baking, instead of high-heat cooking methods like deep frying.  Cook using healthy oils, such as olive, canola, or sunflower oil.  Avoid cooking with butter, cream, or high-fat meats. Meal planning  Eat meals and snacks regularly, preferably at the same times every day. Avoid going long periods of time without eating.  Eat foods that are high in fiber, such as fresh fruits, vegetables, beans, and whole grains. Talk with your dietitian about how many servings of carbs you can eat at each meal.  Eat 4-6 oz (112-168 g) of lean protein each day, such as lean meat, chicken, fish, eggs, or tofu. One ounce (oz) of lean protein is equal to: ? 1 oz (28 g) of meat, chicken, or fish. ? 1 egg. ?  cup (62 g) of tofu.  Eat some foods each day that contain healthy fats, such as avocado, nuts, seeds, and fish.   What foods should I eat? Fruits Berries. Apples. Oranges. Peaches. Apricots. Plums. Grapes. Mango. Papaya. Pomegranate. Kiwi. Cherries. Vegetables Lettuce. Spinach. Leafy greens, including kale, chard, collard greens, and mustard greens. Beets. Cauliflower. Cabbage. Broccoli. Carrots. Green beans. Tomatoes. Peppers. Onions. Cucumbers. Brussels sprouts. Grains Whole grains, such as whole-wheat or whole-grain bread, crackers, tortillas, cereal, and pasta. Unsweetened oatmeal. Quinoa. Brown or wild rice. Meats and other proteins Seafood. Poultry without skin. Lean cuts of poultry and beef. Tofu. Nuts. Seeds. Dairy Low-fat or fat-free dairy products such as milk, yogurt, and cheese. The items listed above may not be a complete list of foods and beverages you can eat. Contact a dietitian for more information. What foods should I avoid? Fruits Fruits canned with  syrup. Vegetables Canned vegetables. Frozen vegetables with butter or cream sauce. Grains Refined white flour and flour products such as bread, pasta, snack foods, and cereals. Avoid all processed foods. Meats and other proteins Fatty cuts of meat. Poultry with skin. Breaded or fried meats. Processed meat. Avoid saturated fats. Dairy Full-fat yogurt, cheese, or milk. Beverages Sweetened drinks, such as soda or iced tea. The items listed above may not be a complete list of foods and beverages you should avoid. Contact a dietitian for more information. Questions to ask a health care provider  Do I need to meet with a diabetes educator?  Do I need to meet with a dietitian?  What number can I call if I have questions?  When are the best times to check my blood glucose? Where to find more information:  American Diabetes Association: diabetes.org  Academy of Nutrition and Dietetics: www.eatright.org  National Institute of Diabetes and Digestive and Kidney Diseases: www.niddk.nih.gov  Association of Diabetes Care and Education Specialists: www.diabeteseducator.org Summary  It is important to have healthy eating   habits because your blood sugar (glucose) levels are greatly affected by what you eat and drink.  A healthy meal plan will help you control your blood glucose and maintain a healthy lifestyle.  Your health care provider may recommend that you work with a dietitian to make a meal plan that is best for you.  Keep in mind that carbohydrates (carbs) and alcohol have immediate effects on your blood glucose levels. It is important to count carbs and to use alcohol carefully. This information is not intended to replace advice given to you by your health care provider. Make sure you discuss any questions you have with your health care provider. Document Revised: 06/14/2019 Document Reviewed: 06/14/2019 Elsevier Patient Education  2021 Elsevier Inc.  

## 2021-05-15 LAB — URINE CULTURE: Organism ID, Bacteria: NO GROWTH

## 2021-05-16 ENCOUNTER — Encounter: Payer: Self-pay | Admitting: Adult Health

## 2021-05-16 ENCOUNTER — Ambulatory Visit (INDEPENDENT_AMBULATORY_CARE_PROVIDER_SITE_OTHER): Payer: BC Managed Care – PPO | Admitting: Adult Health

## 2021-05-16 ENCOUNTER — Other Ambulatory Visit: Payer: Self-pay

## 2021-05-16 VITALS — BP 117/81 | HR 97 | Ht 73.0 in | Wt 304.0 lb

## 2021-05-16 DIAGNOSIS — I69320 Aphasia following cerebral infarction: Secondary | ICD-10-CM

## 2021-05-16 DIAGNOSIS — E118 Type 2 diabetes mellitus with unspecified complications: Secondary | ICD-10-CM

## 2021-05-16 DIAGNOSIS — I10 Essential (primary) hypertension: Secondary | ICD-10-CM | POA: Diagnosis not present

## 2021-05-16 DIAGNOSIS — I6340 Cerebral infarction due to embolism of unspecified cerebral artery: Secondary | ICD-10-CM

## 2021-05-16 DIAGNOSIS — E785 Hyperlipidemia, unspecified: Secondary | ICD-10-CM | POA: Diagnosis not present

## 2021-05-16 NOTE — Patient Instructions (Signed)
Continue Xarelto (rivaroxaban) daily  and atorvastatin for secondary stroke prevention  Continue to follow up with PCP regarding cholesterol, blood pressure and diabetes management  Maintain strict control of hypertension with blood pressure goal below 130/90, diabetes with hemoglobin A1c goal below 7.0 % and cholesterol with LDL cholesterol (bad cholesterol) goal below 70 mg/dL.   Continue to follow with cardiology routinely       Thank you for coming to see [place] at [facility]. I hope we have been able to provide you high quality care today.  You may receive a patient satisfaction survey over the next few weeks. We would appreciate your feedback and comments so that we may continue to improve ourselves and the health of our patients.

## 2021-05-16 NOTE — Progress Notes (Signed)
[facility] [address] [place]. [place] [address] [phone]       OFFICE FOLLOW UP NOTE    [name] Date of Birth:  01-12-1976 Medical Record Number:  [id number]   Primary neurologist: Dr. Pearlean Kane Primary care provider: Early Osmond, NP   Reason for visit: Stroke follow-up  Chief Complaint  Patient presents with   Cerebrovascular Accident    Rm 2, 4 month FU  "no new concerns"     HPI:   Update 05/16/2021 JM: Returns for 46-month stroke follow-up.  Overall stable since prior visit without new stroke/TIA symptoms.  Residual word finding difficulty with some improvement since prior visit. He has since returned back to work ([employer]) - no difficulty.  Continues to maintain ADLs and IADLs independently.  Compliant on Xarelto and atorvastatin without side effects.  Blood pressure today 117/81.  Recent A1c 7.5.  Previously referred to be seen by sleep clinic for possible sleep apnea but at this time, denies daytime fatigue, witnessed apneas, insomnia or snoring.  No new concerns at this time.    History provided for reference purposes only Update 01/10/2021 JM: Rodney Kane returns for 46-month stroke follow-up unaccompanied.  He has been stable since prior visit without new stroke/TIA symptoms.  Reports residual word finding difficulty but overall improving.  He is able to maintain all ADLs and majority of IADLs independently. Reports compliance on Xarelto and atorvastatin without associated side effects.  Blood pressure today 138/98.  He has not yet underwent recommended sleep study.  He does not routinely check glucose levels at home.  He does report slowly progressive bilateral lower extremity L>R painful paresthesias which are worse at night and difficulty tolerating even the sheet touching his feet.  He has not had recent lab work.  No further concerns at this time.  Initial consult visit 10/10/2020 Dr. 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  Initially presented to [facility] emergency room on 07/19/2020 for evaluation for confusion and altered mental status for several days.  He also had shortness of breath and chest pain and initially negative Covid test and negative chest CT for pulmonary embolism.  He was found to be aphasic and CT scan of the head was obtained which showed left MCA subacute infarct.  He is however subsequently tested Covid positive during hospitalization.  BNP was elevated at 1600.  2D echo showed diminished ejection fraction of 20% with left ventricular large mural thrombus.  CT angiogram showed no large vessel extracranial intracranial stenosis.  LDL cholesterol 102 mg percent and hemoglobin A1c 7.7.  Patient was started on IV heparin initially and subsequently switched to warfarin with target INR goal of between 2 and 2.5.  His D-dimer is elevated 1.27 and fibrinogen level was 608.  He was treated with remdesivir and steroids.  Patient subsequently made good recovery from his COVID as well as with aphasia but had mild residual expressive aphasia.  He was able to handle his own affairs.  He returned back to the ER on 09/25/2020 with difficulty using his right arm.  He did he describe numbness as well as weakness which happened in the morning when he woke up from  sleep.  He was supposed to be on warfarin but admitted to not being very compliant and INR on admission was low at 1.3.  MRI scan of the brain was obtained which showed recent subacute left MCA branch hemorrhagic infarct as well as tiny punctate infarct in the left cerebellum which was  acute.  Repeat echocardiogram showed persistent large apical mural thrombus in the left ventricle with ejection fraction less than 20%.  LDL cholesterol this time was satisfactory at 50 and A1c was yet elevated at 7.9.  After discussion with cardiology and pharmacy it was decided to switch the patient from warfarin to Xarelto because of compliance issues of monitoring INR and patient living alone making it difficult for him to go for blood checks and appointments.  Patient states is done well since discharge.  He had no further recurrent TIA or stroke symptoms and right upper extremity numbness and balance has fully resolved.  He continues to expressive language and speech difficulties with nonfluent speech and word hesitancy.  He is living now with his son.  Is still responsible for his own medications and is pretty much independent in most activities of daily living.  He states his sugars are doing better now he does not check it every day.  He is tolerating Lipitor well without muscle aches and pains.  His blood pressure is good and today it is 102/79.  Patient has not been tested for sleep apnea before.  ROS:   14 system review of systems is positive for those listed in HPI all other systems negative  PMH:  Past Medical History:  Diagnosis Date   Coronavirus infection 06/2020   COVID 06/2020   Diabetes mellitus without complication (HCC)    Hemoglobin A1C greater than 9%, indicating poor diabetic control 12/2019   Hyperglycemia 12/2019   Hyperlipidemia 12/2019   Hypertension    Hypertensive urgency 12/2019   Noncompliance with medication regimen 12/2019   Nonischemic (hypertensive) dilated cardiomyopathy (HCC) 12/2016   EF has been 30 to 35%, recently lower.   Stroke [facility])    Urine ketones 12/2019   Vitamin D deficiency 12/2019    Social History:  Social History   Socioeconomic History   Marital status: Divorced    Spouse name: Not on file   Number of children: 3   Years of  education: Not on file   Highest education level: Not on file  Occupational History    Employer: [employer] (medical)  Tobacco Use   Smoking status: Never   Smokeless tobacco: Never  Vaping Use   Vaping Use: Never used  Substance and Sexual Activity   Alcohol use: No   Drug use: No   Sexual activity: Yes  Other Topics Concern   Not on file  Social History Narrative   Not on file   Social Determinants of Health   Financial Resource Strain: High Risk   Difficulty of Paying Living Expenses: Hard  Food Insecurity: No Food Insecurity   Worried About Running Out of Food in the Last Year: Never true   Ran Out of Food in the Last Year: Never true  Transportation Needs: No Transportation Needs   Lack of Transportation (Medical): No   Lack of Transportation (Non-Medical): No  Physical Activity: Inactive   Days of Exercise per Week: 0 days   Minutes of Exercise per Session: 0 min  Stress: Stress Concern Present   Feeling of Stress : To some extent  Social Connections: Not on file  Intimate Partner Violence: Not on file    Medications:   Current Outpatient Medications on File Prior to Visit  Medication Sig Dispense Refill   atorvastatin (LIPITOR) 40 MG tablet Take 1 tablet (40 mg total) by mouth daily. 90 tablet 3   glipiZIDE (GLUCOTROL) 10 MG tablet Take 1 tablet (10 mg total) by mouth 2 (two) times daily before a meal. 180 tablet 3   hydrocortisone cream 1 % Apply 1 application topically 2 (two) times daily. 30 g 6   hydrOXYzine (ATARAX/VISTARIL) 10 MG tablet Take 1 tablet (10 mg total) by mouth 3 (three) times daily as needed. 90 tablet 11   losartan (COZAAR) 100 MG tablet Take 1.5 tablets (150 mg total) by mouth daily. 135 tablet 1   polyethylene glycol powder (GLYCOLAX/MIRALAX) 17 GM/SCOOP powder Take 17 g by mouth daily. 3350 g 1   potassium chloride SA (KLOR-CON) 20 MEQ tablet Take 1 tablet (20 mEq total) by mouth daily. 90 tablet 3   rivaroxaban (XARELTO) 20 MG TABS  tablet TAKE 1 TABLET (20 MG TOTAL) BY MOUTH DAILY WITH SUPPER. 30 tablet 30   sitaGLIPtin (JANUVIA) 25 MG tablet Take 1 tablet (25 mg total) by mouth daily. 90 tablet 3   spironolactone (ALDACTONE) 25 MG tablet TAKE 1/2 TABLET (12.5 MG TOTAL) BY MOUTH DAILY. 30 tablet 3   torsemide (DEMADEX) 20 MG tablet Take 2 tablets (40 mg total) by mouth daily. 180 tablet 1   metoprolol succinate (TOPROL-XL) 50 MG 24 hr tablet Take 1 tablet (50 mg total) by mouth daily. Take with or immediately following a meal. 90 tablet 3   No current facility-administered medications on file prior to visit.    Allergies:  No Known Allergies  Physical Exam Today's Vitals   05/16/21 1055  BP: 117/81  Pulse: 97  Weight: (!) 304 lb (137.9 kg)  Height: 6\' 1"  (1.854 m)   Body mass index is 40.11 kg/m.   General: Obese very pleasant middle-aged African-American male, seated, in no evident distress Head: head normocephalic and atraumatic.   Neck: supple with no carotid or supraclavicular bruits Cardiovascular: regular rate and rhythm, no murmurs Musculoskeletal: no deformity Skin:  no rash/petichiae .prominent keloid on the right cheek Vascular:  Normal pulses all extremities  Neurologic Exam Mental Status: Awake and fully alert. Oriented to place and time. Recent and remote memory intact. Attention span, concentration and fund of knowledge appropriate. Mood and affect appropriate.  Mild expressive aphasia with nonfluent speech and word finding difficulties and hesitancy.  No paraphasic errors.  Able to name and repeat without difficulty Cranial Nerves: Pupils equal, briskly reactive to light. Extraocular movements full without nystagmus. Visual fields full to confrontation. Hearing intact. Facial sensation intact.  Right lower facial asymmetry., tongue, palate moves normally and symmetrically.  Motor: Normal bulk and tone. Normal strength in all tested extremity muscles.  Sensory.: intact to touch , pinprick ,  position and vibratory sensation BUE.  Decreased light touch and vibratory sensation bilateral lower extremities distally Coordination: Rapid alternating movements normal in all extremities. Finger-to-nose and heel-to-shin performed accurately bilaterally. Gait and Station: Arises from chair without difficulty. Stance is normal. Gait demonstrates normal stride length and balance . Able to heel, toe and tandem walk with mild difficulty.  Reflexes: 1+ and symmetric. Toes downgoing.      ASSESSMENT: 45 year old African-American male with embolic left MCA branch infarct in December 2021 in the setting of acute Covid infection as well as small left cerebellar embolic infarct in March  2022 secondary to left ventricular mural thrombus and noncompliance with warfarin.  Vascular risk factors of left ventricular clot, cardiomyopathy, combined systolic and diastolic CHF, obesity, hypertension, hyperlipidemia, diabetes and at risk for sleep apnea     PLAN:  1.  Left MCA stroke -Continue Xarelto and atorvastatin 40 mg daily for secondary stroke prevention -Discussed secondary stroke prevention measures and importance of close PCP follow-up for aggressive stroke risk factor management  2.  HTN -BP goal<130/90 -Stable on current regimen  3.  HLD -LDL goal<70 -Prior LDL 94 on 01/10/2021 -increased atorvastatin to 40 mg daily  -request f/u with PCP for repeat lipid panel  4.  DM -A1c goal<7 -Recent A1c 7.5 on glipizide and sitagliptin per PCP  5.  LV mural thrombus -Continue Xarelto managed and monitored by cardiology and continue routine follow-up   Overall stable from stroke standpoint.  Follow-up on an as-needed basis.     CC:  Rodney Osmond, NP    I spent 26 minutes of face-to-face and non-face-to-face time with patient.  This included previsit chart review, lab review, study review, electronic health record documentation, and patient education and discussion regarding history of prior  stroke with residual deficits as well as secondary stroke prevention education and importance of managing stroke risk factors and answered all other questions to patient satisfaction  Ihor Austin, [facility]  [facility] [address] [place], [place] [address]  Phone [phone] Fax [phone] Note: This document was prepared with digital dictation and possible smart phrase technology. Any transcriptional errors that result from this process are unintentional.

## 2021-05-29 NOTE — Progress Notes (Addendum)
Office Visit    Patient Name: Rodney Kane Date of Encounter: 05/30/2021  PCP:  Early Osmond, NP   [facility]  Cardiologist:  [name], MD  Advanced Practice Provider:  No care team member to display Electrophysiologist:  None   Chief Complaint    Rodney Kane is a 45 y.o. male with a hx of hypertension, hyperlipidemia, history of LV thrombus, combined systolic and diastolic CHF, nonischemic cardiomyopathy, recent COVID infection in December 2021, diabetes mellitus type 2, stroke who presents today for 85-month follow-up appointment.  Past Medical History    Past Medical History:  Diagnosis Date   Coronavirus infection 06/2020   COVID 06/2020   Diabetes mellitus without complication (HCC)    Hemoglobin A1C greater than 9%, indicating poor diabetic control 12/2019   Hyperglycemia 12/2019   Hyperlipidemia 12/2019   Hypertension    Hypertensive urgency 12/2019   Noncompliance with medication regimen 12/2019   Nonischemic (hypertensive) dilated cardiomyopathy (HCC) 12/2016   EF has been 30 to 35%, recently lower.   Stroke [facility])    Urine ketones 12/2019   Vitamin D deficiency 12/2019   Past Surgical History:  Procedure Laterality Date   left knee surgery     RIGHT/LEFT HEART CATH AND CORONARY ANGIOGRAPHY N/A 01/05/2017   Procedure: Right/Left Heart Cath and Coronary Angiography;  Surgeon: [name], MD;  Location: [facility] INVASIVE CV LAB;  Service: Cardiovascular; angiograph normal coronaries.  PAP mean 19 million mercury, PCWP 15 mmHg.  LVEDP 9 mmHg.  Cardiac output/index 7.84/2.7.  RAP mean 4 mmHg.   TRANSTHORACIC ECHOCARDIOGRAM  01/04/2017   EF 30 to 35%.  Moderate LVH.  Moderate severely reduced EF.  Diffuse HK.  Normal valves.   TRANSTHORACIC ECHOCARDIOGRAM  07/20/2020    Severely reduced LV function-EF 20 to 25%, global HK.  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  Mildly reduced cardiac  function.  Unable to assess PA pressures.  Moderate LA dilation.  Small pericardial effusion.  Relatively normal mitral valve and tricuspid valve.  Normal RA pressures.    Allergies  No Known Allergies  History of Present Illness    Rodney Kane is a 45 y.o. male with a hx of hypertension, hyperlipidemia, history of LV thrombus, combined systolic and diastolic CHF, nonischemic cardiomyopathy, recent COVID infection in December 2021, diabetes mellitus type 2, stroke who presents today for 35-month follow-up appointment.  He was last seen in the office on 12/04/2020 by Edd Fabian, NP.  We were consulted during his March admission for LV thrombus and continued severe LV dysfunction.  An echocardiogram was performed on 09/25/2020 which showed an LVEF of less than 20%.  He was discharged on 09/27/2020 on Xarelto, losartan, carvedilol, and spironolactone.  During his last office visit he had noticed some increased lower extremity swelling.  A low-sodium diet was discussed.  He reported compliance with his Xarelto.  His losartan was titrated and a BMP was performed a week later.  His kidney function had been followed closely and his last creatinine was 1.49 which was done 02/02/2021.  Of note, his BNP was found to be 1620.5.  He was seen by his primary care provider at that time and due to his noncompliance with twice daily medications he was changed to Toprol-XL 50 mg daily and losartan 150 mg once daily.  His weight was up 35 pounds over his dry weight on torsemide 40 mg daily was added.  There was  a discussion about referring him back to the heart failure clinic and heart failure pharmacist for optimal management.  A follow-up echocardiogram was also suggested.  Today, he feels pretty good.  He is back to work and works at [employer].  He works second shift usually.  His questions today were mostly around his medications.  He has been taking his spironolactone one whole pill every other day  instead of the prescribed half a pill daily.  He also takes his torsemide as needed and usually 20 mg instead of 40 mg.  He states he usually takes one pill about 3 times a week.  He has not noticed any lower extremity edema or shortness of breath.  He is trying to cut back on his sugar and salt intake but does have a sweet tooth for reese's pieces and trail mix.  His echocardiogram from March showed that his left ventricular ejection fraction was less than 20%.  We will plan to do a repeat echocardiogram to check heart function and his left ventricular thrombus.  He is currently anticoagulated on Xarelto.  He does have a past medical history of stroke but does not have any residual weakness.  He does have some issues with stuttering and word finding.  Since he expressed an interest in starting a more structured exercise program a PREP at [facility] referral has been sent.   Reports no shortness of breath nor dyspnea on exertion. Reports no chest pain, pressure, or tightness. No edema, orthopnea, PND. Reports no palpitations.     EKGs/Labs/Other Studies Reviewed:    ECHOCARDIOGRAM LIMITED REPORT   09/25/2020  IMPRESSIONS     1. 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 The apical thrombus  was confirmed with Definity contrast.      . Left ventricular ejection fraction, by estimation, is <20%. The left  ventricle has severely decreased function. The left ventricle demonstrates  regional wall motion abnormalities (see scoring diagram/findings for  description). Left ventricular  diastolic parameters are consistent with Grade III diastolic dysfunction  (restrictive). There is severe akinesis of the left ventricular,  mid-apical lateral wall, anteroseptal wall and apical segment.   2. The mitral valve is normal in structure. Trivial mitral valve  regurgitation. No evidence of mitral stenosis.   3. The aortic valve is normal in structure. Aortic valve regurgitation is  not visualized. No aortic  stenosis is present.   4. There is mildly elevated pulmonary artery systolic pressure.   FINDINGS   Left Ventricle: There is a large apical mural thrombus in the LV . The  apical thrombus was confirmed with Definity contrast.  Left ventricular ejection fraction, by estimation, is <20%. The left  ventricle has severely decreased function. The left ventricle demonstrates  regional wall motion abnormalities. Severe akinesis of the left  ventricular, mid-apical lateral wall,  anteroseptal wall and apical segment. Definity contrast agent was given IV  to delineate the left ventricular endocardial borders. Left ventricular  diastolic parameters are consistent with Grade III diastolic dysfunction  (restrictive).   Right Ventricle: There is mildly elevated pulmonary artery systolic  pressure. The tricuspid regurgitant velocity is 2.61 m/s, and with an  assumed right atrial pressure of 15 mmHg, the estimated right ventricular  systolic pressure is [id number] mmHg.   Mitral Valve: The mitral valve is normal in structure. Trivial mitral  valve regurgitation. No evidence of mitral valve stenosis.   Tricuspid Valve: The tricuspid valve is normal in structure. Tricuspid  valve regurgitation is mild.   Aortic Valve: The aortic valve is normal in structure. Aortic valve  regurgitation is not visualized. No aortic stenosis is present.   Pulmonic Valve: The pulmonic valve was normal in structure. Pulmonic valve  regurgitation is not visualized.   EKG:  EKG is ordered today.  The ekg ordered today demonstrates NSR, rate 77 bpm.  Recent Labs: 07/22/2020: Magnesium 2.0 01/10/2021: ALT 14; TSH 1.520 02/02/2021: B Natriuretic Peptide 1,620.5; BUN 17; Creatinine, Ser 1.49; Hemoglobin 15.4; Platelets 204; Potassium 4.6; Sodium 138  Recent Lipid Panel    Component Value Date/Time   CHOL 163 01/10/2021 1159   TRIG 63 01/10/2021 1159   HDL 57 01/10/2021 1159   CHOLHDL 2.9 01/10/2021 1159   CHOLHDL 3.0  09/26/2020 0327   VLDL 8 09/26/2020 0327   LDLCALC 94 01/10/2021 1159    Home Medications   Current Meds  Medication Sig   atorvastatin (LIPITOR) 40 MG tablet Take 1 tablet (40 mg total) by mouth daily.   glipiZIDE (GLUCOTROL) 10 MG tablet Take 1 tablet (10 mg total) by mouth 2 (two) times daily before a meal.   hydrocortisone cream 1 % Apply 1 application topically 2 (two) times daily.   hydrOXYzine (ATARAX/VISTARIL) 10 MG tablet Take 1 tablet (10 mg total) by mouth 3 (three) times daily as needed.   losartan (COZAAR) 100 MG tablet Take 1.5 tablets (150 mg total) by mouth daily.   polyethylene glycol powder (GLYCOLAX/MIRALAX) 17 GM/SCOOP powder Take 17 g by mouth daily.   potassium chloride SA (KLOR-CON) 20 MEQ tablet Take 1 tablet (20 mEq total) by mouth daily.   rivaroxaban (XARELTO) 20 MG TABS tablet TAKE 1 TABLET (20 MG TOTAL) BY MOUTH DAILY WITH SUPPER.   sitaGLIPtin (JANUVIA) 25 MG tablet Take 1 tablet (25 mg total) by mouth daily.   spironolactone (ALDACTONE) 25 MG tablet TAKE 1/2 TABLET (12.5 MG TOTAL) BY MOUTH DAILY.   torsemide (DEMADEX) 20 MG tablet Take 2 tablets (40 mg total) by mouth daily.     Review of Systems    All other systems reviewed and are otherwise negative except as noted above.  Physical Exam    VS:  BP 126/88   Pulse 77   Ht 6\' 1"  (1.854 m)   Wt (!) 313 lb (142 kg)   BMI 41.30 kg/m  , BMI Body mass index is 41.3 kg/m.  Wt Readings from Last 3 Encounters:  05/30/21 (!) 313 lb (142 kg)  05/16/21 (!) 304 lb (137.9 kg)  05/10/21 (!) 305 lb (138.3 kg)     GEN: Well nourished, well developed, in no acute distress. HEENT: normal. Cardiac: RRR, no murmurs, rubs, or gallops. No clubbing, cyanosis, edema.  Radials/PT 2+ and equal bilaterally.  Respiratory:  Respirations regular and unlabored, clear to auscultation bilaterally. GI: Soft, nontender, nondistended. MS: No deformity or atrophy. Skin: Warm and dry, no rash. Neuro:  Strength and sensation  are intact. Psych: Normal affect.  Assessment & Plan    Combined systolic and diastolic CHF-currently appears euvolemic but weight up over last few weeks. No lower extremity edema and does not report SOB. Weight is up about 10 lbs from his office visit 3 weeks ago.  He is taking his Spirolactone differently than prescribed. He takes one whole pill (25mg ) every other day. He also takes his Demadex as needed and mostly only one pill (20mg ) about 3 times per week. Encouraged daily weights. We will increase his Spirolactone to 25mg  daily so he  does not have to cut his tabs. He will need a f/u Echocardiogram. GDMT includes losartan (reduced to 100 mg daily), spirolactone, torsemide, Toprol-XL. If LVEF still reduced then will transition to [facility] and/or add SGLT-2. Daily weights encouraged.   LV thrombus- Continue Xarelto, denies bleeding complications. Will get f/u Echocardiogram.  Essential hypertension-well controlled on Cozaar and Toprol-XL  Hyperlipidemia-Continue Lipitor at current dose. LDL 50 back in march. We will get a repeat Lipid panel and CMP.   Diabetes mellitus type 2-A1C 7.5. He does not take his blood sugar at home. Advised to start taking his glucose level once per day at minimum. He will need to discuss this with his PCP as well.  History of stroke-Not currently enrolled in PT/OT. No strength deficits at this time but does have some frustration with stuttering and word finding.    Disposition: Follow up 3-4 months with Glenetta Hew, MD or APP.  Signed, Elgie Collard, PA-C 05/30/2021, 10:20 AM  [facility]

## 2021-05-30 ENCOUNTER — Telehealth (HOSPITAL_BASED_OUTPATIENT_CLINIC_OR_DEPARTMENT_OTHER): Payer: Self-pay

## 2021-05-30 ENCOUNTER — Other Ambulatory Visit: Payer: Self-pay

## 2021-05-30 ENCOUNTER — Ambulatory Visit (INDEPENDENT_AMBULATORY_CARE_PROVIDER_SITE_OTHER): Payer: BC Managed Care – PPO | Admitting: Physician Assistant

## 2021-05-30 ENCOUNTER — Encounter (HOSPITAL_BASED_OUTPATIENT_CLINIC_OR_DEPARTMENT_OTHER): Payer: Self-pay | Admitting: Physician Assistant

## 2021-05-30 VITALS — BP 126/88 | HR 77 | Ht 73.0 in | Wt 313.0 lb

## 2021-05-30 DIAGNOSIS — I5043 Acute on chronic combined systolic (congestive) and diastolic (congestive) heart failure: Secondary | ICD-10-CM | POA: Diagnosis not present

## 2021-05-30 DIAGNOSIS — D6869 Other thrombophilia: Secondary | ICD-10-CM

## 2021-05-30 DIAGNOSIS — E785 Hyperlipidemia, unspecified: Secondary | ICD-10-CM | POA: Diagnosis not present

## 2021-05-30 DIAGNOSIS — Z79899 Other long term (current) drug therapy: Secondary | ICD-10-CM

## 2021-05-30 DIAGNOSIS — I513 Intracardiac thrombosis, not elsewhere classified: Secondary | ICD-10-CM

## 2021-05-30 DIAGNOSIS — E669 Obesity, unspecified: Secondary | ICD-10-CM

## 2021-05-30 DIAGNOSIS — I4891 Unspecified atrial fibrillation: Secondary | ICD-10-CM

## 2021-05-30 DIAGNOSIS — E1169 Type 2 diabetes mellitus with other specified complication: Secondary | ICD-10-CM

## 2021-05-30 LAB — LIPID PANEL
Chol/HDL Ratio: 2.6 ratio (ref 0.0–5.0)
Cholesterol, Total: 132 mg/dL (ref 100–199)
HDL: 50 mg/dL (ref >39)
LDL Chol Calc (NIH): 71 mg/dL (ref 0–99)
Triglycerides: 49 mg/dL (ref 0–149)
VLDL Cholesterol Cal: 11 mg/dL (ref 5–40)

## 2021-05-30 LAB — COMPREHENSIVE METABOLIC PANEL
ALT: 22 IU/L (ref 0–44)
AST: 27 IU/L (ref 0–40)
Albumin/Globulin Ratio: 1.4 (ref 1.2–2.2)
Albumin: 4.3 g/dL (ref 4.0–5.0)
Alkaline Phosphatase: 87 IU/L (ref 44–121)
BUN/Creatinine Ratio: 17 (ref 9–20)
BUN: 19 mg/dL (ref 6–24)
Bilirubin Total: 0.6 mg/dL (ref 0.0–1.2)
CO2: 24 mmol/L (ref 20–29)
Calcium: 9.7 mg/dL (ref 8.7–10.2)
Chloride: 105 mmol/L (ref 96–106)
Creatinine, Ser: 1.12 mg/dL (ref 0.76–1.27)
Globulin, Total: 3 g/dL (ref 1.5–4.5)
Glucose: 67 mg/dL — ABNORMAL LOW (ref 70–99)
Potassium: 5.1 mmol/L (ref 3.5–5.2)
Sodium: 143 mmol/L (ref 134–144)
Total Protein: 7.3 g/dL (ref 6.0–8.5)
eGFR: 82 mL/min/{1.73_m2} (ref >59)

## 2021-05-30 MED ORDER — SPIRONOLACTONE 25 MG PO TABS: 25.0000 mg | ORAL_TABLET | Freq: Every day | ORAL | 1 refills | Status: DC

## 2021-05-30 MED ORDER — LOSARTAN POTASSIUM 100 MG PO TABS: 100.0000 mg | ORAL_TABLET | Freq: Every day | ORAL | 1 refills | Status: DC

## 2021-05-30 NOTE — Patient Instructions (Addendum)
Medication Instructions:  Take Spironolactone 25 mg by mouth daily  *If you need a refill on your cardiac medications before your next appointment, please call your pharmacy*   Lab Work: CMP and Fasting Lipids Today  If you have labs (blood work) drawn today and your tests are completely normal, you will receive your results only by: MyChart Message (if you have MyChart) OR A paper copy in the mail If you have any lab test that is abnormal or we need to change your treatment, we will call you to review the results.   Testing/Procedures: Your physician has requested that you have an echocardiogram. Echocardiography is a painless test that uses sound waves to create images of your heart. It provides your doctor with information about the size and shape of your heart and how well your heart's chambers and valves are working. This procedure takes approximately one hour. There are no restrictions for this procedure.   Follow-Up: At [facility], you and your health needs are our priority.  As part of our continuing mission to provide you with exceptional heart care, we have created designated Provider Care Teams.  These Care Teams include your primary Cardiologist (physician) and Advanced Practice Providers (APPs -  Physician Assistants and Nurse Practitioners) who all work together to provide you with the care you need, when you need it.  We recommend signing up for the patient portal called "MyChart".  Sign up information is provided on this After Visit Summary.  MyChart is used to connect with patients for Virtual Visits (Telemedicine).  Patients are able to view lab/test results, encounter notes, upcoming appointments, etc.  Non-urgent messages can be sent to your provider as well.   To learn more about what you can do with MyChart, go to [email or web address].    Your next appointment:   3 month(s)  The format for your next appointment:   In Person  Provider:   [name], MD      Other Instructions You have been referred to Our Prep program at the [facility]

## 2021-05-30 NOTE — Telephone Encounter (Signed)
-----   Message from Barrie Dunker sent at 05/30/2021  1:34 PM EST -----  ----- Message ----- From: Harvel Ricks, RN Sent: 05/30/2021  12:33 PM EST To: Hurman Horn today ----- Message ----- From: Bunnie Domino Sent: 05/30/2021  12:20 PM EST To: Cv Div Dwb Triage  Can we please call Mr. Tooker and instruct him to reduce Losartan to 100mg  daily. This was noted after his office visit.   RX already sent to pharmacy   Thanks!   , PA-C

## 2021-05-30 NOTE — Telephone Encounter (Signed)
Attempted to contact pt. To instruct him to reduce his Losartan to 100mg  daily.. Unable to leave message.    Will attempt another call on 05/31/21

## 2021-05-30 NOTE — Addendum Note (Signed)
Addended by: Sharlene Dory on: 05/30/2021 12:18 PM   Modules accepted: Orders

## 2021-05-31 ENCOUNTER — Telehealth (HOSPITAL_BASED_OUTPATIENT_CLINIC_OR_DEPARTMENT_OTHER): Payer: Self-pay

## 2021-05-31 NOTE — Telephone Encounter (Signed)
Unable to reach pt. Unable to leave message will attempt to call again

## 2021-06-03 ENCOUNTER — Telehealth (HOSPITAL_BASED_OUTPATIENT_CLINIC_OR_DEPARTMENT_OTHER): Payer: Self-pay

## 2021-06-03 NOTE — Telephone Encounter (Signed)
pt. has DPR on file to speak with daughter. Spoke with daughter about trying to get ahold of her father. Said she would alert him to my calls. I will try to call him again in a few minutes

## 2021-06-03 NOTE — Telephone Encounter (Signed)
Spoke with pt. About changing Losartan from 150mg  to 100mg . Pt. Verbalizes understanding and will get new prescription from the pharmacy.    Spoke with pt. Also about recent lab work, Per , PA no reason for concern with lab work. Pt. Verbalizes understanding.

## 2021-06-04 NOTE — Progress Notes (Signed)
Attempted to call patient with results. No answer unable to leave VM.   "Cholesterol numbers at goal. Normal kidney, liver, electrolytes. Recommend repeat BMP in 1 week monitoring his potassium with increase in Spirolactone at clinic visit."    Will attempt to recall patient with results. For now will place orders for labs. PT. To be seen on 06/10/21 for ECHO, will also put note in echo appointment note to send pt. Upstairs for lab work after

## 2021-06-04 NOTE — Addendum Note (Signed)
Addended by: Marlene Lard on: 06/04/2021 08:26 AM   Modules accepted: Orders

## 2021-06-10 ENCOUNTER — Ambulatory Visit (INDEPENDENT_AMBULATORY_CARE_PROVIDER_SITE_OTHER): Payer: BC Managed Care – PPO

## 2021-06-10 ENCOUNTER — Other Ambulatory Visit: Payer: Self-pay

## 2021-06-10 DIAGNOSIS — I428 Other cardiomyopathies: Secondary | ICD-10-CM | POA: Diagnosis not present

## 2021-06-10 DIAGNOSIS — I513 Intracardiac thrombosis, not elsewhere classified: Secondary | ICD-10-CM | POA: Diagnosis not present

## 2021-06-10 DIAGNOSIS — I5043 Acute on chronic combined systolic (congestive) and diastolic (congestive) heart failure: Secondary | ICD-10-CM | POA: Diagnosis not present

## 2021-06-10 DIAGNOSIS — E785 Hyperlipidemia, unspecified: Secondary | ICD-10-CM | POA: Diagnosis not present

## 2021-06-10 MED ORDER — PERFLUTREN LIPID MICROSPHERE
1.0000 mL | INTRAVENOUS | Status: AC | PRN
  Administered 2021-06-10: 2 mL via INTRAVENOUS

## 2021-06-11 LAB — ECHOCARDIOGRAM COMPLETE
AR max vel: 2.72 cm2
AV Area VTI: 2.63 cm2
AV Area mean vel: 2.28 cm2
AV Mean grad: 2 mmHg
AV Peak grad: 3.5 mmHg
Ao pk vel: 0.94 m/s
Area-P 1/2: 6.65 cm2
Calc EF: 30.8 %
S' Lateral: 5.14 cm
Single Plane A2C EF: 40.1 %
Single Plane A4C EF: 23.2 %

## 2021-06-11 LAB — BASIC METABOLIC PANEL
BUN/Creatinine Ratio: 17 (ref 9–20)
BUN: 20 mg/dL (ref 6–24)
CO2: 21 mmol/L (ref 20–29)
Calcium: 9.1 mg/dL (ref 8.7–10.2)
Chloride: 105 mmol/L (ref 96–106)
Creatinine, Ser: 1.2 mg/dL (ref 0.76–1.27)
Glucose: 110 mg/dL — ABNORMAL HIGH (ref 70–99)
Potassium: 4.7 mmol/L (ref 3.5–5.2)
Sodium: 143 mmol/L (ref 134–144)
eGFR: 76 mL/min/{1.73_m2} (ref >59)

## 2021-06-11 LAB — BRAIN NATRIURETIC PEPTIDE: BNP: 199.8 pg/mL — ABNORMAL HIGH (ref 0.0–100.0)

## 2021-06-12 NOTE — Progress Notes (Signed)
Called pt. To review echo results. Pt. Endorses understanding. Pt. Agreeable to coming in sooner to optimize HF meds. Scheduled for 12/6 @ 1005

## 2021-06-25 ENCOUNTER — Ambulatory Visit (HOSPITAL_BASED_OUTPATIENT_CLINIC_OR_DEPARTMENT_OTHER): Payer: BC Managed Care – PPO | Admitting: Family

## 2021-06-25 NOTE — Progress Notes (Deleted)
Office Visit    Patient Name: Rodney Kane Date of Encounter: 06/25/2021  PCP:  Early Osmond, NP   [facility]  Cardiologist:  [name], MD  Advanced Practice Provider:  No care team member to display Electrophysiologist:  None      Chief Complaint    Rodney Kane is a 45 y.o. male with a hx of hypertension, hyperlipidemia, LV thrombus, combined statin diastolic failure, nonischemic cardiomyopathy, COVID-19 06/2020, DM2, CVA presents today for heart failure follow up   Past Medical History    Past Medical History:  Diagnosis Date   Coronavirus infection 06/2020   COVID 06/2020   Diabetes mellitus without complication (HCC)    Hemoglobin A1C greater than 9%, indicating poor diabetic control 12/2019   Hyperglycemia 12/2019   Hyperlipidemia 12/2019   Hypertension    Hypertensive urgency 12/2019   Noncompliance with medication regimen 12/2019   Nonischemic (hypertensive) dilated cardiomyopathy (HCC) 12/2016   EF has been 30 to 35%, recently lower.   Stroke [facility])    Urine ketones 12/2019   Vitamin D deficiency 12/2019   Past Surgical History:  Procedure Laterality Date   left knee surgery     RIGHT/LEFT HEART CATH AND CORONARY ANGIOGRAPHY N/A 01/05/2017   Procedure: Right/Left Heart Cath and Coronary Angiography;  Surgeon: [name], MD;  Location: [facility] INVASIVE CV LAB;  Service: Cardiovascular; angiograph normal coronaries.  PAP mean 19 million mercury, PCWP 15 mmHg.  LVEDP 9 mmHg.  Cardiac output/index 7.84/2.7.  RAP mean 4 mmHg.   TRANSTHORACIC ECHOCARDIOGRAM  01/04/2017   EF 30 to 35%.  Moderate LVH.  Moderate severely reduced EF.  Diffuse HK.  Normal valves.   TRANSTHORACIC ECHOCARDIOGRAM  07/20/2020    Severely reduced LV function-EF 20 to 25%, global HK.  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  Mildly reduced cardiac function.  Unable to assess PA pressures.  Moderate LA dilation.  Small  pericardial effusion.  Relatively normal mitral valve and tricuspid valve.  Normal RA pressures.    Allergies  No Known Allergies  History of Present Illness    Rodney Kane is a 45 y.o. male with a hx of hypertension, hyperlipidemia, LV thrombus, combined statin diastolic failure, nonischemic cardiomyopathy, COVID-19 06/2020, DM2, CVA last seen 05/30/21.  Cardiology was consulted March 2022 during admission for LV thrombus with LVEF less than 20%.  He was discharged 09/27/2020 on Xarelto, losartan, carvedilol, spironolactone.  His medications have been changed to once daily dosing for improved compliance.  He was most recently seen 05/30/2021.  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  He was noted to be taking spironolactone 1 tablet every other day instead of half pill daily.  He was taking torsemide as needed rather than prescribed 40 mg daily.  He denies lower extremity edema and shortness of breath.  He was referred to the prep program at the [facility] for increased exercise.  Repeat echocardiogram ordered and performed 06/10/2021 showing LVEF 30-35%, mild LVH, RV mildly enlarged, trivial MR, dilation of ascending aorta 41 mm, swirling artifact on contrast images at apex suggesting slow flow but no clear thrombus.  Presents today for titration of heart failure therapies. ***  EKGs/Labs/Other Studies Reviewed:   The following studies were reviewed today: ***  Echo 06/10/21 1. Left ventricular ejection fraction, by estimation, is 30 to 35%. The  left ventricle has moderately decreased function. The left ventricle  demonstrates global hypokinesis.  The left ventricular internal cavity size  was moderately dilated. There is mild   left ventricular hypertrophy. Left ventricular diastolic parameters are  indeterminate.   2. Right ventricular systolic function is normal. The right ventricular  size is mildly enlarged. Tricuspid regurgitation signal is inadequate for  assessing PA  pressure.   3. Left atrial size was moderately dilated.   4. The mitral valve is normal in structure. Trivial mitral valve  regurgitation.   5. The aortic valve is tricuspid. Aortic valve regurgitation is not  visualized. No aortic stenosis is present.   6. Aortic dilatation noted. There is dilatation of the ascending aorta,  measuring 41 mm.   7. Swirling artifact on contrast images at apex suggesting slow flow but  no clear thrombus seen   Comparison(s): EF <20 %, large apical mural thrombus in the LV.   ECHOCARDIOGRAM LIMITED REPORT   09/25/2020  IMPRESSIONS   1. 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 The apical thrombus  was confirmed with Definity contrast.      . Left ventricular ejection fraction, by estimation, is <20%. The left  ventricle has severely decreased function. The left ventricle demonstrates  regional wall motion abnormalities (see scoring diagram/findings for  description). Left ventricular  diastolic parameters are consistent with Grade III diastolic dysfunction  (restrictive). There is severe akinesis of the left ventricular,  mid-apical lateral wall, anteroseptal wall and apical segment.   2. The mitral valve is normal in structure. Trivial mitral valve  regurgitation. No evidence of mitral stenosis.   3. The aortic valve is normal in structure. Aortic valve regurgitation is  not visualized. No aortic stenosis is present.   4. There is mildly elevated pulmonary artery systolic pressure.   EKG:  No EKG today  Recent Labs: 07/22/2020: Magnesium 2.0 01/10/2021: TSH 1.520 02/02/2021: Hemoglobin 15.4; Platelets 204 05/30/2021: ALT 22 06/10/2021: BNP 199.8; BUN 20; Creatinine, Ser 1.20; Potassium 4.7; Sodium 143  Recent Lipid Panel    Component Value Date/Time   CHOL 132 05/30/2021 1015   TRIG 49 05/30/2021 1015   HDL 50 05/30/2021 1015   CHOLHDL 2.6 05/30/2021 1015   CHOLHDL 3.0 09/26/2020 0327   VLDL 8 09/26/2020 0327   LDLCALC 71 05/30/2021  1015    Home Medications   No outpatient medications have been marked as taking for the 06/25/21 encounter (Appointment) with Loel Dubonnet, NP.     Review of Systems      All other systems reviewed and are otherwise negative except as noted above.  Physical Exam    VS:  There were no vitals taken for this visit. , BMI There is no height or weight on file to calculate BMI.  Wt Readings from Last 3 Encounters:  05/30/21 (!) 313 lb (142 kg)  05/16/21 (!) 304 lb (137.9 kg)  05/10/21 (!) 305 lb (138.3 kg)     GEN: Well nourished, well developed, in no acute distress. HEENT: normal. Neck: Supple, no JVD, carotid bruits, or masses. Cardiac: ***RRR, no murmurs, rubs, or gallops. No clubbing, cyanosis, edema.  ***Radials/PT 2+ and equal bilaterally.  Respiratory:  ***Respirations regular and unlabored, clear to auscultation bilaterally. GI: Soft, nontender, nondistended. MS: No deformity or atrophy. Skin: Warm and dry, no rash. Neuro:  Strength and sensation are intact. Psych: Normal affect.  Assessment & Plan    HFrEF / NICM -   LV thrombus -   Hx of CVA -   HTN -   HLD -  09/2020 LDl 50. ***  DM2 - 05/10/21 A1c 7.5. 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 Plan to repeat echo in 1 year. Coordinate at follow up. Continue optimal BP control.   Disposition: Follow up {follow up:15908} with Glenetta Hew, MD or APP.  Signed, Loel Dubonnet, NP 06/25/2021, 8:07 AM [place]

## 2021-07-02 ENCOUNTER — Encounter: Payer: Self-pay | Admitting: Nurse Practitioner

## 2021-07-09 ENCOUNTER — Ambulatory Visit (HOSPITAL_BASED_OUTPATIENT_CLINIC_OR_DEPARTMENT_OTHER): Payer: BC Managed Care – PPO | Admitting: Family

## 2021-07-17 DIAGNOSIS — Z0289 Encounter for other administrative examinations: Secondary | ICD-10-CM

## 2021-08-12 ENCOUNTER — Ambulatory Visit: Payer: Self-pay | Admitting: Nurse Practitioner

## 2021-09-02 NOTE — Progress Notes (Unsigned)
Cardiology Office Note:    Date:  09/04/2021   ID:  Rodney, Kane 12/20/1975, MRN [id number]  PCP:  Barrett Henle, NP   [facility] Providers Cardiologist:  Glenetta Hew, MD { Click to update primary MD,subspecialty MD or APP then REFRESH:1}    Referring MD: Barrett Henle, NP   No chief complaint on file. ***  History of Present Illness:    Rodney Kane is a 45 y.o. male with a hx of stroke, NICM, combined systolic and diastolic heart failure, LV thrombus, CKD, HTN, and DM.   He was admitted in March 2022 and cardiology was consulted for LV thrombus and LV dysfunction. Echo showed LVEF < 20%, G3DD. He was discharged 09/27/20 on losartan, Xarelto, carvedilol, and spironolactone. His follow-up was not consistent.  At cardiology visit 02/07/21, he was found to have BNP of 1620 and he reported lower extremity swelling. Medication adjustments were made to d/c twice daily carvedilol and start Toprol XL and to take losartan 150 mg daily. His weight was increased and furosemide was changed to torsemide 40 mg. He was advised to return in 2 weeks for follow-up and repeat echo in 3 months.    He returned to our office on 05/30/21 and was seen by Nicholes Rough, [place]. At that visit, he reported he was working and denied significant shortness of breath or leg edema. He was taking his medications differently than prescribed - taking spironolactone 1 pill every other day instead of 1/2 daily, taking torsemide 20 mg instead of 40 about 3 days per week. At the office visit, he was advised to take spironolactone 25 mg daily and to reduce losartan to 100 mg daily. Repeat echo revealed LVEF improved to 30-35%, no clear LV thrombus. Office visit was recommended 2 weeks later for optimization of medication.  Today, he is here   Past Medical History:  Diagnosis Date   Coronavirus infection 06/2020   COVID 06/2020   Diabetes mellitus without complication (HCC)    Hemoglobin A1C greater than  9%, indicating poor diabetic control 12/2019   Hyperglycemia 12/2019   Hyperlipidemia 12/2019   Hypertension    Hypertensive urgency 12/2019   Noncompliance with medication regimen 12/2019   Nonischemic (hypertensive) dilated cardiomyopathy ([place]) 12/2016   EF has been 30 to 35%, recently lower.   Stroke [facility])    Urine ketones 12/2019   Vitamin D deficiency 12/2019    Past Surgical History:  Procedure Laterality Date   left knee surgery     RIGHT/LEFT HEART CATH AND CORONARY ANGIOGRAPHY N/A 01/05/2017   Procedure: Right/Left Heart Cath and Coronary Angiography;  Surgeon: Leonie Man, MD;  Location: [place] CV LAB;  Service: Cardiovascular; angiograph normal coronaries.  PAP mean 19 million mercury, PCWP 15 mmHg.  LVEDP 9 mmHg.  Cardiac output/index 7.84/2.7.  RAP mean 4 mmHg.   TRANSTHORACIC ECHOCARDIOGRAM  01/04/2017   EF 30 to 35%.  Moderate LVH.  Moderate severely reduced EF.  Diffuse HK.  Normal valves.   TRANSTHORACIC ECHOCARDIOGRAM  07/20/2020    Severely reduced LV function-EF 20 to 25%, global HK.  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  Mildly reduced cardiac function.  Unable to assess PA pressures.  Moderate LA dilation.  Small pericardial effusion.  Relatively normal mitral valve and tricuspid valve.  Normal RA pressures.    Current Medications: No outpatient medications have been marked as taking for the 09/04/21 encounter (Appointment) with Ann Maki, Lanice Schwab, NP.  Allergies:   Patient has no known allergies.   Social History   Socioeconomic History   Marital status: Divorced    Spouse name: Not on file   Number of children: 3   Years of education: Not on file   Highest education level: Not on file  Occupational History    Employer: [employer]  Tobacco Use   Smoking status: Never   Smokeless tobacco: Never  Vaping Use   Vaping Use: Never used  Substance and Sexual Activity   Alcohol use: No   Drug use: No    Sexual activity: Yes  Other Topics Concern   Not on file  Social History Narrative   Not on file   Social Determinants of Health   Financial Resource Strain: High Risk   Difficulty of Paying Living Expenses: Hard  Food Insecurity: No Food Insecurity   Worried About Running Out of Food in the Last Year: Never true   Ran Out of Food in the Last Year: Never true  Transportation Needs: No Transportation Needs   Lack of Transportation (Medical): No   Lack of Transportation (Non-Medical): No  Physical Activity: Inactive   Days of Exercise per Week: 0 days   Minutes of Exercise per Session: 0 min  Stress: Stress Concern Present   Feeling of Stress : To some extent  Social Connections: Not on file     Family History: The patient's ***family history includes Diabetic kidney disease in his mother; Hypertension in his mother; Multiple sclerosis in his mother.  ROS:   Please see the history of present illness.    *** All other systems reviewed and are negative.  Labs/Other Studies Reviewed:    The following studies were reviewed today:  Echo 06/10/21  Left Ventricle: Left ventricular ejection fraction, by estimation, is 30  to 35%. The left ventricle has moderately decreased function. The left  ventricle demonstrates global hypokinesis. Definity contrast agent was  given IV to delineate the left ventricular endocardial borders. The left ventricular internal cavity size was moderately dilated. There is mild left ventricular hypertrophy. Left ventricular diastolic parameters are indeterminate.  Right Ventricle: The right ventricular size is mildly enlarged. Right  vetricular wall thickness was not well visualized. Right ventricular  systolic function is normal. Tricuspid regurgitation signal is inadequate  for assessing PA pressure.  Left Atrium: Left atrial size was moderately dilated.  Right Atrium: Right atrial size was normal in size.  Pericardium: There is no evidence of  pericardial effusion.  Mitral Valve: The mitral valve is normal in structure. Trivial mitral  valve regurgitation.  Tricuspid Valve: The tricuspid valve is normal in structure. Tricuspid  valve regurgitation is mild.  Aortic Valve: The aortic valve is tricuspid. Aortic valve regurgitation is  not visualized. No aortic stenosis is present. Aortic valve mean gradient  measures 2.0 mmHg. Aortic valve peak gradient measures 3.5 mmHg. Aortic  valve area, by VTI measures 2.63 cm.  Pulmonic Valve: The pulmonic valve was not well visualized. Pulmonic valve  regurgitation is trivial.  Aorta: The aortic root is normal in size and structure and aortic  dilatation noted. There is dilatation of the ascending aorta, measuring 41  mm.  IAS/Shunts: The interatrial septum was not well visualized.   Recent Labs: 01/10/2021: TSH 1.520 02/02/2021: Hemoglobin 15.4; Platelets 204 05/30/2021: ALT 22 06/10/2021: BNP 199.8; BUN 20; Creatinine, Ser 1.20; Potassium 4.7; Sodium 143  Recent Lipid Panel    Component Value Date/Time   CHOL 132 05/30/2021 1015  TRIG 49 05/30/2021 1015   HDL 50 05/30/2021 1015   CHOLHDL 2.6 05/30/2021 1015   CHOLHDL 3.0 09/26/2020 0327   VLDL 8 09/26/2020 0327   LDLCALC 71 05/30/2021 1015     Risk Assessment/Calculations:   {Does this patient have ATRIAL FIBRILLATION?:[id number]}       Physical Exam:    VS:  There were no vitals taken for this visit.    Wt Readings from Last 3 Encounters:  05/30/21 (!) 313 lb (142 kg)  05/16/21 (!) 304 lb (137.9 kg)  05/10/21 (!) 305 lb (138.3 kg)     GEN: *** Well nourished, well developed in no acute distress HEENT: Normal NECK: No JVD; No carotid bruits CARDIAC: ***RRR, no murmurs, rubs, gallops RESPIRATORY:  Clear to auscultation without rales, wheezing or rhonchi  ABDOMEN: Soft, non-tender, non-distended MUSCULOSKELETAL:  No edema; No deformity. *** pedal pulses, ***bilaterally SKIN: Warm and dry NEUROLOGIC:  Alert and  oriented x 3 PSYCHIATRIC:  Normal affect   EKG:  EKG is *** ordered today.  The ekg ordered today demonstrates ***  Diagnoses:    No diagnosis found. Assessment and Plan:     ***      {Are you ordering a CV Procedure (e.g. stress test, cath, DCCV, TEE, etc)?   Press F2        :[id number]    Medication Adjustments/Labs and Tests Ordered: Current medicines are reviewed at length with the patient today.  Concerns regarding medicines are outlined above.  No orders of the defined types were placed in this encounter.  No orders of the defined types were placed in this encounter.   There are no Patient Instructions on file for this visit.   Signed, Emmaline Life, NP  09/04/2021 8:28 AM    [facility]

## 2021-09-04 ENCOUNTER — Ambulatory Visit (HOSPITAL_BASED_OUTPATIENT_CLINIC_OR_DEPARTMENT_OTHER): Payer: BC Managed Care – PPO | Admitting: Nurse Practitioner

## 2021-12-05 ENCOUNTER — Ambulatory Visit: Payer: BC Managed Care – PPO | Admitting: Nurse Practitioner

## 2021-12-05 ENCOUNTER — Encounter: Payer: Self-pay | Admitting: Nurse Practitioner

## 2021-12-05 VITALS — BP 159/115 | HR 82 | Resp 16

## 2021-12-05 DIAGNOSIS — E785 Hyperlipidemia, unspecified: Secondary | ICD-10-CM

## 2021-12-05 DIAGNOSIS — I10 Essential (primary) hypertension: Secondary | ICD-10-CM | POA: Diagnosis not present

## 2021-12-05 DIAGNOSIS — E118 Type 2 diabetes mellitus with unspecified complications: Secondary | ICD-10-CM | POA: Diagnosis not present

## 2021-12-05 DIAGNOSIS — R7309 Other abnormal glucose: Secondary | ICD-10-CM

## 2021-12-05 DIAGNOSIS — R739 Hyperglycemia, unspecified: Secondary | ICD-10-CM | POA: Diagnosis not present

## 2021-12-05 LAB — POCT URINALYSIS DIP (CLINITEK)
Bilirubin, UA: NEGATIVE
Glucose, UA: 500 mg/dL — AB
Ketones, POC UA: NEGATIVE mg/dL
Leukocytes, UA: NEGATIVE
Nitrite, UA: NEGATIVE
POC PROTEIN,UA: NEGATIVE
Spec Grav, UA: 1.02 (ref 1.010–1.025)
Urobilinogen, UA: 0.2 E.U./dL
pH, UA: 5.5 (ref 5.0–8.0)

## 2021-12-05 LAB — POCT GLYCOSYLATED HEMOGLOBIN (HGB A1C)
HbA1c POC (<> result, manual entry): 12.8 % (ref 4.0–5.6)
HbA1c, POC (controlled diabetic range): 12.8 % — AB (ref 0.0–7.0)
HbA1c, POC (prediabetic range): 12.8 % — AB (ref 5.7–6.4)
Hemoglobin A1C: 12.8 % — AB (ref 4.0–5.6)

## 2021-12-05 MED ORDER — SPIRONOLACTONE 25 MG PO TABS: 25.0000 mg | ORAL_TABLET | Freq: Every day | ORAL | 1 refills | Status: DC

## 2021-12-05 MED ORDER — TORSEMIDE 20 MG PO TABS: 40.0000 mg | ORAL_TABLET | Freq: Every day | ORAL | 1 refills | Status: DC

## 2021-12-05 MED ORDER — GLIPIZIDE 10 MG PO TABS: 10.0000 mg | ORAL_TABLET | Freq: Two times a day (BID) | ORAL | 3 refills | Status: DC

## 2021-12-05 MED ORDER — ATORVASTATIN CALCIUM 40 MG PO TABS: 40.0000 mg | ORAL_TABLET | Freq: Every day | ORAL | 3 refills | Status: DC

## 2021-12-05 MED ORDER — LOSARTAN POTASSIUM 100 MG PO TABS: 100.0000 mg | ORAL_TABLET | Freq: Every day | ORAL | 1 refills | Status: DC

## 2021-12-05 MED ORDER — SITAGLIPTIN PHOSPHATE 25 MG PO TABS: 25.0000 mg | ORAL_TABLET | Freq: Every day | ORAL | 3 refills | Status: DC

## 2021-12-05 MED ORDER — METOPROLOL SUCCINATE ER 50 MG PO TB24: 50.0000 mg | ORAL_TABLET | Freq: Every day | ORAL | 2 refills | Status: DC

## 2021-12-05 NOTE — Patient Instructions (Addendum)
1. Type 2 diabetes mellitus with complication, without long-term current use of insulin (HCC)  - glipiZIDE (GLUCOTROL) 10 MG tablet; Take 1 tablet (10 mg total) by mouth 2 (two) times daily before a meal.  Dispense: 180 tablet; Refill: 3 - sitaGLIPtin (JANUVIA) 25 MG tablet; Take 1 tablet (25 mg total) by mouth daily.  Dispense: 90 tablet; Refill: 3 - atorvastatin (LIPITOR) 40 MG tablet; Take 1 tablet (40 mg total) by mouth daily.  Dispense: 90 tablet; Refill: 3  2. Hyperglycemia  - glipiZIDE (GLUCOTROL) 10 MG tablet; Take 1 tablet (10 mg total) by mouth 2 (two) times daily before a meal.  Dispense: 180 tablet; Refill: 3 - sitaGLIPtin (JANUVIA) 25 MG tablet; Take 1 tablet (25 mg total) by mouth daily.  Dispense: 90 tablet; Refill: 3  3. Hemoglobin A1C greater than 9%, indicating poor diabetic control  Lab Results  Component Value Date   HGBA1C 12.8 (A) 12/05/2021   HGBA1C 12.8 12/05/2021   HGBA1C 12.8 (A) 12/05/2021   HGBA1C 12.8 (A) 12/05/2021     - glipiZIDE (GLUCOTROL) 10 MG tablet; Take 1 tablet (10 mg total) by mouth 2 (two) times daily before a meal.  Dispense: 180 tablet; Refill: 3 - sitaGLIPtin (JANUVIA) 25 MG tablet; Take 1 tablet (25 mg total) by mouth daily.  Dispense: 90 tablet; Refill: 3  4. Hyperlipidemia, unspecified hyperlipidemia type  - atorvastatin (LIPITOR) 40 MG tablet; Take 1 tablet (40 mg total) by mouth daily.  Dispense: 90 tablet; Refill: 3  5. Essential hypertension  - losartan (COZAAR) 100 MG tablet; Take 1 tablet (100 mg total) by mouth daily.  Dispense: 90 tablet; Refill: 1 - spironolactone (ALDACTONE) 25 MG tablet; Take 1 tablet (25 mg total) by mouth daily.  Dispense: 90 tablet; Refill: 1 - torsemide (DEMADEX) 20 MG tablet; Take 2 tablets (40 mg total) by mouth daily.  Dispense: 180 tablet; Refill: 1 - metoprolol succinate (TOPROL-XL) 50 MG 24 hr tablet; Take 1 tablet (50 mg total) by mouth daily. Take with or immediately following a meal.  Dispense:  30 tablet; Refill: 2  Follow up on Monday - for BP recheck

## 2021-12-05 NOTE — Progress Notes (Signed)
@Patient  ID: Rodney Kane, male    DOB: [birth year], 46 y.o.   MRN: [id number]  Chief Complaint  Patient presents with   Follow-up    Pt stated he hasn't been on his medication for about a month    Referring provider: Barrett Henle, NP   HPI  Rodney Kane presents for follow up.  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    Patient presents today for diabetes and hypertension follow-up.  Patient's blood pressure was elevated on arrival to the office today.  It did come down slightly after he rested for a little while in office today.  Patient has been noncompliant with his medications and did not take medications today.  We will call in refills today.  We discussed that it is very important for patient to pick up his blood pressure medicine and start taking it today.  We will follow-up with him on Monday for blood pressure recheck.  Hemoglobin A1c is 12.8 today in office. Denies f/c/s, n/v/d, hemoptysis, PND, chest pain or edema.    No Known Allergies  Immunization History  Administered Date(s) Administered   Tdap 12/01/2017    Past Medical History:  Diagnosis Date   Coronavirus infection 06/2020   COVID 06/2020   Diabetes mellitus without complication (HCC)    Hemoglobin A1C greater than 9%, indicating poor diabetic control 12/2019   Hyperglycemia 12/2019   Hyperlipidemia 12/2019   Hypertension    Hypertensive urgency 12/2019   Noncompliance with medication regimen 12/2019   Nonischemic (hypertensive) dilated  cardiomyopathy ([place]) 12/2016   EF has been 30 to 35%, recently lower.   Stroke [facility])    Urine ketones 12/2019   Vitamin D deficiency 12/2019    Tobacco History: Social History   Tobacco Use  Smoking Status Never  Smokeless Tobacco Never   Counseling given: Not Answered   Outpatient Encounter Medications as of 12/05/2021  Medication Sig   atorvastatin (LIPITOR) 40 MG tablet Take 1 tablet (40 mg total) by mouth daily.   glipiZIDE (GLUCOTROL) 10 MG tablet Take 1 tablet (10 mg total) by mouth 2 (two) times daily before a meal.   hydrocortisone cream 1 % Apply 1 application topically 2 (two) times daily.   hydrOXYzine (ATARAX/VISTARIL) 10 MG tablet Take 1 tablet (10 mg total) by mouth 3 (three) times daily as needed.   losartan (COZAAR) 100 MG tablet Take 1 tablet (100 mg total) by mouth daily.   metoprolol succinate (TOPROL-XL) 50 MG 24 hr tablet Take 1 tablet (50 mg total) by mouth daily. Take with or immediately following a meal.   polyethylene glycol powder (GLYCOLAX/MIRALAX) 17 GM/SCOOP powder Take 17 g by mouth daily.   potassium chloride SA (KLOR-CON) 20 MEQ tablet Take 1 tablet (20 mEq total) by mouth daily.   rivaroxaban (XARELTO) 20 MG TABS tablet TAKE 1 TABLET (20 MG TOTAL) BY MOUTH DAILY WITH SUPPER.   sitaGLIPtin (JANUVIA) 25 MG tablet Take 1 tablet (25 mg total) by mouth daily.   spironolactone (ALDACTONE) 25 MG tablet Take 1 tablet (25 mg total) by mouth daily.   torsemide (DEMADEX) 20 MG tablet Take 2 tablets (40 mg total) by mouth daily.   [DISCONTINUED] atorvastatin (  LIPITOR) 40 MG tablet Take 1 tablet (40 mg total) by mouth daily.   [DISCONTINUED] glipiZIDE (GLUCOTROL) 10 MG tablet Take 1 tablet (10 mg total) by mouth 2 (two) times daily before a meal.   [DISCONTINUED] losartan (COZAAR) 100 MG tablet Take 1 tablet (100 mg total) by mouth daily.   [DISCONTINUED] metoprolol succinate (TOPROL-XL) 50 MG 24 hr tablet Take 1 tablet (50 mg total) by mouth daily. Take with or  immediately following a meal.   [DISCONTINUED] sitaGLIPtin (JANUVIA) 25 MG tablet Take 1 tablet (25 mg total) by mouth daily.   [DISCONTINUED] spironolactone (ALDACTONE) 25 MG tablet Take 1 tablet (25 mg total) by mouth daily.   [DISCONTINUED] torsemide (DEMADEX) 20 MG tablet Take 2 tablets (40 mg total) by mouth daily.   No facility-administered encounter medications on file as of 12/05/2021.     Review of Systems  Review of Systems  Constitutional: Negative.   HENT: Negative.    Cardiovascular: Negative.   Gastrointestinal: Negative.   Allergic/Immunologic: Negative.   Neurological: Negative.   Psychiatric/Behavioral: Negative.        Physical Exam  BP (!) 159/115 (BP Location: Left Arm)   Pulse 82   Resp 16   Wt Readings from Last 5 Encounters:  05/30/21 (!) 313 lb (142 kg)  05/16/21 (!) 304 lb (137.9 kg)  05/10/21 (!) 305 lb (138.3 kg)  02/07/21 (!) 307 lb 9.6 oz (139.5 kg)  02/07/21 (!) 305 lb (138.3 kg)     Physical Exam Vitals and nursing note reviewed.  Constitutional:      General: He is not in acute distress.    Appearance: He is well-developed.  Cardiovascular:     Rate and Rhythm: Normal rate and regular rhythm.  Pulmonary:     Effort: Pulmonary effort is normal.     Breath sounds: Normal breath sounds.  Skin:    General: Skin is warm and dry.  Neurological:     Mental Status: He is alert and oriented to person, place, and time.     Lab Results:  CBC    Component Value Date/Time   WBC 7.1 02/02/2021 0944   RBC 5.91 (H) 02/02/2021 0944   HGB 15.4 02/02/2021 0944   HGB 14.8 06/29/2018 0944   HCT 48.4 02/02/2021 0944   HCT 45.6 06/29/2018 0944   PLT 204 02/02/2021 0944   PLT 264 06/29/2018 0944   MCV 81.9 02/02/2021 0944   MCV 80 06/29/2018 0944   MCH 26.1 02/02/2021 0944   MCHC 31.8 02/02/2021 0944   RDW 21.0 (H) 02/02/2021 0944   RDW 11.8 (L) 06/29/2018 0944   LYMPHSABS 2.8 09/25/2020 0655   LYMPHSABS 2.2 06/29/2018 0944   MONOABS  0.6 09/25/2020 0655   EOSABS 0.1 09/25/2020 0655   EOSABS 0.2 06/29/2018 0944   BASOSABS 0.1 09/25/2020 0655   BASOSABS 0.1 06/29/2018 0944    BMET    Component Value Date/Time   NA 143 06/10/2021 1116   K 4.7 06/10/2021 1116   CL 105 06/10/2021 1116   CO2 21 06/10/2021 1116   GLUCOSE 110 (H) 06/10/2021 1116   GLUCOSE 135 (H) 02/02/2021 0944   BUN 20 06/10/2021 1116   CREATININE 1.20 06/10/2021 1116   CALCIUM 9.1 06/10/2021 1116   GFRNONAA 59 (L) 02/02/2021 0944   GFRAA 93 09/14/2020 1222    BNP    Component Value Date/Time   BNP 199.8 (H) 06/10/2021 1116   BNP 1,620.5 (H) 02/02/2021 0944    ProBNP No results  found for: PROBNP  Imaging: No results found.   Assessment & Plan:   Type 2 diabetes mellitus with complication, without long-term current use of insulin (HCC) - glipiZIDE (GLUCOTROL) 10 MG tablet; Take 1 tablet (10 mg total) by mouth 2 (two) times daily before a meal.  Dispense: 180 tablet; Refill: 3 - sitaGLIPtin (JANUVIA) 25 MG tablet; Take 1 tablet (25 mg total) by mouth daily.  Dispense: 90 tablet; Refill: 3 - atorvastatin (LIPITOR) 40 MG tablet; Take 1 tablet (40 mg total) by mouth daily.  Dispense: 90 tablet; Refill: 3  2. Hyperglycemia  - glipiZIDE (GLUCOTROL) 10 MG tablet; Take 1 tablet (10 mg total) by mouth 2 (two) times daily before a meal.  Dispense: 180 tablet; Refill: 3 - sitaGLIPtin (JANUVIA) 25 MG tablet; Take 1 tablet (25 mg total) by mouth daily.  Dispense: 90 tablet; Refill: 3  3. Hemoglobin A1C greater than 9%, indicating poor diabetic control  Lab Results  Component Value Date   HGBA1C 12.8 (A) 12/05/2021   HGBA1C 12.8 12/05/2021   HGBA1C 12.8 (A) 12/05/2021   HGBA1C 12.8 (A) 12/05/2021     - glipiZIDE (GLUCOTROL) 10 MG tablet; Take 1 tablet (10 mg total) by mouth 2 (two) times daily before a meal.  Dispense: 180 tablet; Refill: 3 - sitaGLIPtin (JANUVIA) 25 MG tablet; Take 1 tablet (25 mg total) by mouth daily.  Dispense: 90  tablet; Refill: 3  4. Hyperlipidemia, unspecified hyperlipidemia type  - atorvastatin (LIPITOR) 40 MG tablet; Take 1 tablet (40 mg total) by mouth daily.  Dispense: 90 tablet; Refill: 3  5. Essential hypertension  - losartan (COZAAR) 100 MG tablet; Take 1 tablet (100 mg total) by mouth daily.  Dispense: 90 tablet; Refill: 1 - spironolactone (ALDACTONE) 25 MG tablet; Take 1 tablet (25 mg total) by mouth daily.  Dispense: 90 tablet; Refill: 1 - torsemide (DEMADEX) 20 MG tablet; Take 2 tablets (40 mg total) by mouth daily.  Dispense: 180 tablet; Refill: 1 - metoprolol succinate (TOPROL-XL) 50 MG 24 hr tablet; Take 1 tablet (50 mg total) by mouth daily. Take with or immediately following a meal.  Dispense: 30 tablet; Refill: 2  Follow up on Monday - for BP recheck     Fenton Foy, NP 12/05/2021

## 2021-12-05 NOTE — Assessment & Plan Note (Signed)
-   glipiZIDE (GLUCOTROL) 10 MG tablet; Take 1 tablet (10 mg total) by mouth 2 (two) times daily before a meal.  Dispense: 180 tablet; Refill: 3 - sitaGLIPtin (JANUVIA) 25 MG tablet; Take 1 tablet (25 mg total) by mouth daily.  Dispense: 90 tablet; Refill: 3 - atorvastatin (LIPITOR) 40 MG tablet; Take 1 tablet (40 mg total) by mouth daily.  Dispense: 90 tablet; Refill: 3  2. Hyperglycemia  - glipiZIDE (GLUCOTROL) 10 MG tablet; Take 1 tablet (10 mg total) by mouth 2 (two) times daily before a meal.  Dispense: 180 tablet; Refill: 3 - sitaGLIPtin (JANUVIA) 25 MG tablet; Take 1 tablet (25 mg total) by mouth daily.  Dispense: 90 tablet; Refill: 3  3. Hemoglobin A1C greater than 9%, indicating poor diabetic control  Lab Results  Component Value Date   HGBA1C 12.8 (A) 12/05/2021   HGBA1C 12.8 12/05/2021   HGBA1C 12.8 (A) 12/05/2021   HGBA1C 12.8 (A) 12/05/2021     - glipiZIDE (GLUCOTROL) 10 MG tablet; Take 1 tablet (10 mg total) by mouth 2 (two) times daily before a meal.  Dispense: 180 tablet; Refill: 3 - sitaGLIPtin (JANUVIA) 25 MG tablet; Take 1 tablet (25 mg total) by mouth daily.  Dispense: 90 tablet; Refill: 3  4. Hyperlipidemia, unspecified hyperlipidemia type  - atorvastatin (LIPITOR) 40 MG tablet; Take 1 tablet (40 mg total) by mouth daily.  Dispense: 90 tablet; Refill: 3  5. Essential hypertension  - losartan (COZAAR) 100 MG tablet; Take 1 tablet (100 mg total) by mouth daily.  Dispense: 90 tablet; Refill: 1 - spironolactone (ALDACTONE) 25 MG tablet; Take 1 tablet (25 mg total) by mouth daily.  Dispense: 90 tablet; Refill: 1 - torsemide (DEMADEX) 20 MG tablet; Take 2 tablets (40 mg total) by mouth daily.  Dispense: 180 tablet; Refill: 1 - metoprolol succinate (TOPROL-XL) 50 MG 24 hr tablet; Take 1 tablet (50 mg total) by mouth daily. Take with or immediately following a meal.  Dispense: 30 tablet; Refill: 2  Follow up on Monday - for BP recheck

## 2021-12-06 LAB — COMPREHENSIVE METABOLIC PANEL
ALT: 20 IU/L (ref 0–44)
AST: 17 IU/L (ref 0–40)
Albumin/Globulin Ratio: 1.4 (ref 1.2–2.2)
Albumin: 4.1 g/dL (ref 4.0–5.0)
Alkaline Phosphatase: 129 IU/L — ABNORMAL HIGH (ref 44–121)
BUN/Creatinine Ratio: 9 (ref 9–20)
BUN: 10 mg/dL (ref 6–24)
Bilirubin Total: 0.4 mg/dL (ref 0.0–1.2)
CO2: 26 mmol/L (ref 20–29)
Calcium: 8.7 mg/dL (ref 8.7–10.2)
Chloride: 97 mmol/L (ref 96–106)
Creatinine, Ser: 1.12 mg/dL (ref 0.76–1.27)
Globulin, Total: 3 g/dL (ref 1.5–4.5)
Glucose: 316 mg/dL — ABNORMAL HIGH (ref 70–99)
Potassium: 4.6 mmol/L (ref 3.5–5.2)
Sodium: 137 mmol/L (ref 134–144)
Total Protein: 7.1 g/dL (ref 6.0–8.5)
eGFR: 82 mL/min/{1.73_m2} (ref >59)

## 2021-12-06 LAB — CBC
Hematocrit: 44.7 % (ref 37.5–51.0)
Hemoglobin: 14.6 g/dL (ref 13.0–17.7)
MCH: 26.4 pg — ABNORMAL LOW (ref 26.6–33.0)
MCHC: 32.7 g/dL (ref 31.5–35.7)
MCV: 81 fL (ref 79–97)
Platelets: 249 10*3/uL (ref 150–450)
RBC: 5.53 x10E6/uL (ref 4.14–5.80)
RDW: 12 % (ref 11.6–15.4)
WBC: 6.4 10*3/uL (ref 3.4–10.8)

## 2021-12-09 ENCOUNTER — Ambulatory Visit: Payer: BC Managed Care – PPO | Admitting: Nurse Practitioner

## 2021-12-15 ENCOUNTER — Encounter (HOSPITAL_COMMUNITY): Payer: Self-pay | Admitting: Emergency Medicine

## 2021-12-15 ENCOUNTER — Emergency Department (HOSPITAL_COMMUNITY)
Admission: EM | Admit: 2021-12-15 | Discharge: 2021-12-16 | Disposition: A | Discharge status: Home / Self Care | Payer: BC Managed Care – PPO | Attending: Emergency Medicine | Admitting: Emergency Medicine

## 2021-12-15 ENCOUNTER — Other Ambulatory Visit: Payer: Self-pay

## 2021-12-15 DIAGNOSIS — Z7984 Long term (current) use of oral hypoglycemic drugs: Secondary | ICD-10-CM | POA: Diagnosis not present

## 2021-12-15 DIAGNOSIS — R7989 Other specified abnormal findings of blood chemistry: Secondary | ICD-10-CM | POA: Diagnosis not present

## 2021-12-15 DIAGNOSIS — I509 Heart failure, unspecified: Secondary | ICD-10-CM | POA: Diagnosis not present

## 2021-12-15 DIAGNOSIS — N179 Acute kidney failure, unspecified: Secondary | ICD-10-CM | POA: Diagnosis not present

## 2021-12-15 DIAGNOSIS — E1165 Type 2 diabetes mellitus with hyperglycemia: Secondary | ICD-10-CM | POA: Diagnosis not present

## 2021-12-15 DIAGNOSIS — R739 Hyperglycemia, unspecified: Secondary | ICD-10-CM

## 2021-12-15 LAB — I-STAT VENOUS BLOOD GAS, ED
Acid-Base Excess: 4 mmol/L — ABNORMAL HIGH (ref 0.0–2.0)
Bicarbonate: 27.7 mmol/L (ref 20.0–28.0)
Calcium, Ion: 1.07 mmol/L — ABNORMAL LOW (ref 1.15–1.40)
HCT: 49 % (ref 39.0–52.0)
Hemoglobin: 16.7 g/dL (ref 13.0–17.0)
O2 Saturation: 89 %
Potassium: 4.6 mmol/L (ref 3.5–5.1)
Sodium: 133 mmol/L — ABNORMAL LOW (ref 135–145)
TCO2: 29 mmol/L (ref 22–32)
pCO2, Ven: 38.7 mmHg — ABNORMAL LOW (ref 44–60)
pH, Ven: 7.463 — ABNORMAL HIGH (ref 7.25–7.43)
pO2, Ven: 54 mmHg — ABNORMAL HIGH (ref 32–45)

## 2021-12-15 LAB — CBG MONITORING, ED
Glucose-Capillary: 589 mg/dL (ref 70–99)
Glucose-Capillary: >600 mg/dL (ref 70–99)
Glucose-Capillary: 577 mg/dL (ref 70–99)

## 2021-12-15 LAB — URINALYSIS, ROUTINE W REFLEX MICROSCOPIC
Bacteria, UA: NONE SEEN
Bilirubin Urine: NEGATIVE
Glucose, UA: >=500 mg/dL — AB
Hgb urine dipstick: NEGATIVE
Ketones, ur: NEGATIVE mg/dL
Leukocytes,Ua: NEGATIVE
Nitrite: NEGATIVE
Protein, ur: NEGATIVE mg/dL
Specific Gravity, Urine: 1.028 (ref 1.005–1.030)
pH: 6 (ref 5.0–8.0)

## 2021-12-15 LAB — BASIC METABOLIC PANEL
Anion gap: 10 (ref 5–15)
BUN: 14 mg/dL (ref 6–20)
CO2: 27 mmol/L (ref 22–32)
Calcium: 9.1 mg/dL (ref 8.9–10.3)
Chloride: 95 mmol/L — ABNORMAL LOW (ref 98–111)
Creatinine, Ser: 1.71 mg/dL — ABNORMAL HIGH (ref 0.61–1.24)
GFR, Estimated: 49 mL/min — ABNORMAL LOW (ref >60)
Glucose, Bld: 744 mg/dL (ref 70–99)
Potassium: 5.1 mmol/L (ref 3.5–5.1)
Sodium: 132 mmol/L — ABNORMAL LOW (ref 135–145)

## 2021-12-15 LAB — BETA-HYDROXYBUTYRIC ACID: Beta-Hydroxybutyric Acid: 0.16 mmol/L (ref 0.05–0.27)

## 2021-12-15 LAB — CBC
HCT: 48.2 % (ref 39.0–52.0)
Hemoglobin: 15 g/dL (ref 13.0–17.0)
MCH: 25.9 pg — ABNORMAL LOW (ref 26.0–34.0)
MCHC: 31.1 g/dL (ref 30.0–36.0)
MCV: 83.1 fL (ref 80.0–100.0)
Platelets: 245 10*3/uL (ref 150–400)
RBC: 5.8 MIL/uL (ref 4.22–5.81)
RDW: 12.5 % (ref 11.5–15.5)
WBC: 7.1 10*3/uL (ref 4.0–10.5)
nRBC: 0 % (ref 0.0–0.2)

## 2021-12-15 MED ORDER — SODIUM CHLORIDE 0.9 % IV BOLUS
1000.0000 mL | Freq: Once | INTRAVENOUS | Status: AC
  Administered 2021-12-15: 1000 mL via INTRAVENOUS

## 2021-12-15 MED ORDER — INSULIN ASPART 100 UNIT/ML IJ SOLN
10.0000 [IU] | Freq: Once | INTRAMUSCULAR | Status: AC
  Administered 2021-12-15: 10 [IU] via SUBCUTANEOUS

## 2021-12-15 MED ORDER — LACTATED RINGERS IV BOLUS
1000.0000 mL | Freq: Once | INTRAVENOUS | Status: AC
  Administered 2021-12-15: 1000 mL via INTRAVENOUS

## 2021-12-15 NOTE — ED Triage Notes (Signed)
Pt reported to ED with c/o excessive thirst, urinary frequency and feeling "drained". Pt states he has hx of diabetes and recently started back taking medications. Denies any chest pain or shortness of breath. States he is concerned that he may be dehydrated.

## 2021-12-15 NOTE — ED Provider Notes (Signed)
[facility] EMERGENCY DEPARTMENT Provider Note   CSN: [id number] Arrival date & time: 12/15/21  1927     History  Chief Complaint  Patient presents with   Hyperglycemia    Minas Undra Blazier is a 46 y.o. male.  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  Patient was restarted on diabetes medications at PCP visit 12/05/2021.  Patient has been having muscle cramps in his arms and legs over the past few days, leading to him thinking he was dehydrated.  He has been drinking Gatorade and Pedialyte but continues to have the cramps.  No fevers chills, URI symptoms, chest pain or shortness of breath.  From a heart failure standpoint he is able to lie flat and has not had lower extremity swelling.  No cough.  He is urinating a lot, especially after he drinks.  No vomiting or abdominal pain.      Home Medications Prior to Admission medications   Medication Sig Start Date End Date Taking? Authorizing Provider  atorvastatin (LIPITOR) 40 MG tablet Take 1 tablet (40 mg total) by mouth daily. 12/05/21   Ivonne Andrew, NP  glipiZIDE (GLUCOTROL) 10 MG tablet Take 1 tablet (10 mg total) by mouth 2 (two) times daily before a meal. 12/05/21   Ivonne Andrew, NP  hydrocortisone cream 1 % Apply 1 application topically 2 (two) times daily. 05/10/21   Barbette Merino, NP  hydrOXYzine (ATARAX/VISTARIL) 10 MG tablet Take 1 tablet (10 mg total) by mouth 3 (three) times daily as needed. 09/19/20   Kallie Locks, FNP  losartan (COZAAR) 100 MG tablet Take 1 tablet (100 mg total) by mouth daily. 12/05/21   Ivonne Andrew, NP  metoprolol succinate (TOPROL-XL) 50 MG 24 hr tablet Take 1 tablet (50 mg total) by mouth daily. Take with or immediately following a meal. 12/05/21 03/05/22   Ivonne Andrew, NP  polyethylene glycol powder (GLYCOLAX/MIRALAX) 17 GM/SCOOP powder Take 17 g by mouth daily. 08/29/20   Kallie Locks, FNP  potassium chloride SA (KLOR-CON) 20 MEQ tablet Take 1 tablet (20 mEq total) by mouth daily. 09/05/20   [name], PA-C  rivaroxaban (XARELTO) 20 MG TABS tablet TAKE 1 TABLET (20 MG TOTAL) BY MOUTH DAILY WITH SUPPER. 09/26/20 09/26/21  Inez Catalina, MD  sitaGLIPtin (JANUVIA) 25 MG tablet Take 1 tablet (25 mg total) by mouth daily. 12/05/21   Ivonne Andrew, NP  spironolactone (ALDACTONE) 25 MG tablet Take 1 tablet (25 mg total) by mouth daily. 12/05/21 12/05/22  Ivonne Andrew, NP  torsemide (DEMADEX) 20 MG tablet Take 2 tablets (40 mg total) by mouth daily. 12/05/21   Ivonne Andrew, NP      Allergies    Patient has no known allergies.    Review of Systems   Review of Systems  Physical Exam Updated Vital Signs BP 111/76   Pulse (!) 104   Temp 99.1 F (37.3 C) (Oral)   Resp 15   SpO2 100%   Physical Exam Vitals and nursing note reviewed.  Constitutional:      General: He is not in acute distress.    Appearance: He is well-developed.  HENT:     Head: Normocephalic and atraumatic.     Nose: Nose normal.  Mouth/Throat:     Mouth: Mucous membranes are dry.  Eyes:     General:        Right eye: No discharge.        Left eye: No discharge.     Conjunctiva/sclera: Conjunctivae normal.  Cardiovascular:     Rate and Rhythm: Normal rate and regular rhythm.     Heart sounds: Normal heart sounds.  Pulmonary:     Effort: Pulmonary effort is normal.     Breath sounds: Normal breath sounds.     Comments: Lungs are clear to auscultation bilaterally, no crackles or rales Abdominal:     Palpations: Abdomen is soft.     Tenderness: There is no abdominal tenderness.  Musculoskeletal:     Cervical back: Normal range of motion and neck supple.     Right lower leg: No edema.     Left lower leg: No edema.  Skin:    General: Skin is warm  and dry.  Neurological:     Mental Status: He is alert.    ED Results / Procedures / Treatments   Labs (all labs ordered are listed, but only abnormal results are displayed) Labs Reviewed  BASIC METABOLIC PANEL - Abnormal; Notable for the following components:      Result Value   Sodium 132 (*)    Chloride 95 (*)    Glucose, Bld 744 (*)    Creatinine, Ser 1.71 (*)    GFR, Estimated 49 (*)    All other components within normal limits  CBC - Abnormal; Notable for the following components:   MCH 25.9 (*)    All other components within normal limits  URINALYSIS, ROUTINE W REFLEX MICROSCOPIC - Abnormal; Notable for the following components:   Color, Urine STRAW (*)    Glucose, UA >=500 (*)    All other components within normal limits  CBG MONITORING, ED - Abnormal; Notable for the following components:   Glucose-Capillary >600 (*)    All other components within normal limits  CBG MONITORING, ED - Abnormal; Notable for the following components:   Glucose-Capillary 589 (*)    All other components within normal limits  I-STAT VENOUS BLOOD GAS, ED - Abnormal; Notable for the following components:   pH, Ven 7.463 (*)    pCO2, Ven 38.7 (*)    pO2, Ven 54 (*)    Acid-Base Excess 4.0 (*)    Sodium 133 (*)    Calcium, Ion 1.07 (*)    All other components within normal limits  CBG MONITORING, ED - Abnormal; Notable for the following components:   Glucose-Capillary 577 (*)    All other components within normal limits  BETA-HYDROXYBUTYRIC ACID    EKG None  Radiology No results found.  Procedures Procedures    Medications Ordered in ED Medications  sodium chloride 0.9 % bolus 1,000 mL (has no administration in time range)  insulin aspart (novoLOG) injection 10 Units (has no administration in time range)    ED Course/ Medical Decision Making/ A&P    Patient seen and examined. History obtained directly from patient and from recent external PCP notes.  Work-up including  labs, imaging, EKG ordered in triage, if performed, were reviewed.    Labs/EKG: Independently reviewed and interpreted.  This included: CBC with normal white blood cell count; BMP with glucose 744, creatinine elevated from baseline at 1.71, normal BUN, normal potassium of 5.1, sodium 132 which corrects to normal, normal anion gap at 10; UA without signs of infection  or ketones  Imaging: None ordered  Medications/Fluids: Ordered: IV fluid bolus, 10 units subcu insulin   Most recent vital signs reviewed and are as follows: BP 111/76   Pulse (!) 104   Temp 99.1 F (37.3 C) (Oral)   Resp 15   SpO2 100%   Initial impression: Hyperglycemia without DKA  10:36 PM Reassessment performed. Patient appears stable.  No signs of fluid overload after 1 L IV fluids.  Lungs are clear to auscultation bilaterally.  He has urinated approximately 500 cc in urinal.  Blood sugar down into the 570s.  Labs personally reviewed and interpreted including: Venous blood gas with pH 7.46, bicarb 27, hemoglobin 16.7.  Reviewed pertinent lab work and imaging with patient at bedside. Questions answered.   Most current vital signs reviewed and are as follows: BP 136/87   Pulse 88   Temp 99.1 F (37.3 C) (Oral)   Resp 18   SpO2 100%   Plan: I think the patient can likely tolerate another fluid bolus.  We will give another dose of subcutaneous insulin.  After completion of this we will check BMP.  If improving, can likely be discharged.  10:44 PM Signout to [employer] at shift change.                            Medical Decision Making Amount and/or Complexity of Data Reviewed Labs: ordered.  Risk Prescription drug management.   Patient with hyperglycemia without DKA.  He has had elevated blood sugars recently.  Does report dietary indiscretion by eating [place] food yesterday.  He appears to be somewhat dehydrated on labs with elevated creatinine.  He is also on a diuretic for heart failure.  He does  not appear to be fluid overloaded.  Trending towards improvement while in the ED, pending completion of therapy and recheck.  No signs of infection or sepsis today.          Final Clinical Impression(s) / ED Diagnoses Final diagnoses:  Hyperglycemia without ketosis  Elevated serum creatinine    Rx / DC Orders ED Discharge Orders     None         Carlisle Cater, PA-C 12/15/21 2247    Ezequiel Essex, MD 12/16/21 [phone]

## 2021-12-15 NOTE — ED Provider Notes (Signed)
Care of patient assumed from [place] at 1130.  Agree with history, physical exam and plan.  See their note for further details. Briefly, 46 year old male with history of diabetes, CHF, CVA with chronic expressive aphasia presents to the emergency department with a chief complaint of dehydration.  Patient reported having muscle cramps in his arms and legs of the last few days.  Has been drinking Gatorade and Pedialyte but continues to have cramps.    Physical Exam  BP 136/87   Pulse 88   Temp 99.1 F (37.3 C) (Oral)   Resp 18   SpO2 100%   Physical Exam Vitals and nursing note reviewed.  Constitutional:      General: He is not in acute distress.    Appearance: He is not ill-appearing, toxic-appearing or diaphoretic.  HENT:     Head: Normocephalic.  Eyes:     General: No scleral icterus.       Right eye: No discharge.        Left eye: No discharge.  Cardiovascular:     Rate and Rhythm: Normal rate.  Pulmonary:     Effort: Pulmonary effort is normal.  Skin:    General: Skin is warm and dry.  Neurological:     General: No focal deficit present.     Mental Status: He is alert and oriented to person, place, and time.     GCS: GCS eye subscore is 4. GCS verbal subscore is 5. GCS motor subscore is 6.  Psychiatric:        Behavior: Behavior is cooperative.    Procedures  Procedures  ED Course / MDM   Clinical Course as of 12/16/21 0530  Mon Dec 16, 2021  0054 BMP shows potassium at 5.6, previous had potassium within normal limits.  Patient has received 20 units of insulin, which would cause increase in potassium rather than increase.  I suspect that sample has hemolysis.  I spoke to technician with lab who confirmed hemolysis in patient's sample. [PB]    Clinical Course User Index [PB] Loni Beckwith, PA-C   Medical Decision Making Amount and/or Complexity of Data Reviewed Labs: ordered.  Risk Prescription drug management.   At time of handoff patient has just  completed second 1 L fluid bolus.  Plan is to recheck BMP and POC CBG.  If creatinine and glucose are improving plan to discharge patient to follow-up closely with his PCP.  After leaving his additional fluid bolus patient reports feeling much better.  Patient denies any pain or discomfort at this time.  Repeat BMP shows creatinine 1.44.  This is improved from previous.  See above note for information regarding hyperkalemia seen on BMP.  Repeat CBG shows glucose of 432.  We will give patient additional 500 mL fluid bolus and recheck sugar in 1 hour.  Repeat CBG shows glucose at 361.    Patient reports that he has medications and can follow-up with his PCP in outpatient setting for further management of his diabetes.  I discussed diet medications and increased exercise to help with diabetes management.  Patient given strict return precautions.  Based on patient's chief complaint, I considered admission might be necessary, however after reassuring ED workup feel patient is reasonable for discharge.  Discussed results, findings, treatment and follow up. Patient advised of return precautions. Patient verbalized understanding and agreed with plan.  Portions of this note were generated with [employer]obby[employer]. Dictation errors may occur despite best attempts at proofreading.  Loni Beckwith, PA-C 12/16/21 0533    Maudie Flakes, MD 12/16/21 [phone]

## 2021-12-16 LAB — BASIC METABOLIC PANEL
Anion gap: 7 (ref 5–15)
BUN: 16 mg/dL (ref 6–20)
CO2: 24 mmol/L (ref 22–32)
Calcium: 8.7 mg/dL — ABNORMAL LOW (ref 8.9–10.3)
Chloride: 103 mmol/L (ref 98–111)
Creatinine, Ser: 1.44 mg/dL — ABNORMAL HIGH (ref 0.61–1.24)
GFR, Estimated: >60 mL/min (ref >60)
Glucose, Bld: 523 mg/dL (ref 70–99)
Potassium: 5.6 mmol/L — ABNORMAL HIGH (ref 3.5–5.1)
Sodium: 134 mmol/L — ABNORMAL LOW (ref 135–145)

## 2021-12-16 LAB — CBG MONITORING, ED
Glucose-Capillary: 432 mg/dL — ABNORMAL HIGH (ref 70–99)
Glucose-Capillary: 361 mg/dL — ABNORMAL HIGH (ref 70–99)

## 2021-12-16 MED ORDER — SODIUM CHLORIDE 0.9 % IV BOLUS
500.0000 mL | Freq: Once | INTRAVENOUS | Status: AC
  Administered 2021-12-16: 500 mL via INTRAVENOUS

## 2021-12-16 NOTE — Discharge Instructions (Signed)
You came to the emergency department today to be evaluated for your dehydration concerns.  Your lab work showed that your blood sugar was very elevated and you had an acute kidney injury.  Your blood sugar improved after receiving insulin and fluids.  Your kidney function improved after receiving fluids.  Please follow-up closely with your primary care doctor this week for further management of your diabetes and to have your kidney function rechecked.  Get help right away if: Your blood sugar monitor reads "high" even when you are taking insulin. You have trouble breathing. You have a change in how you think, feel, or act (mental status). You feel like you may vomit, and the feeling does not go away. You cannot stop vomiting.

## 2021-12-18 ENCOUNTER — Other Ambulatory Visit: Payer: Self-pay

## 2021-12-18 MED ORDER — POTASSIUM CHLORIDE CRYS ER 20 MEQ PO TBCR
20.0000 meq | EXTENDED_RELEASE_TABLET | Freq: Every day | ORAL | 0 refills | Status: DC
Start: 2021-12-18 — End: 2022-02-18

## 2022-02-17 ENCOUNTER — Emergency Department (HOSPITAL_COMMUNITY): Payer: BC Managed Care – PPO

## 2022-02-17 ENCOUNTER — Other Ambulatory Visit: Payer: Self-pay

## 2022-02-17 ENCOUNTER — Encounter (HOSPITAL_COMMUNITY): Payer: Self-pay | Admitting: Emergency Medicine

## 2022-02-17 ENCOUNTER — Inpatient Hospital Stay (HOSPITAL_COMMUNITY)
Admission: EM | Admit: 2022-02-17 | Discharge: 2022-02-19 | DRG: 854 | Disposition: A | Discharge status: Home / Self Care | Payer: BC Managed Care – PPO | Attending: Internal Medicine | Admitting: Internal Medicine

## 2022-02-17 DIAGNOSIS — Z8249 Family history of ischemic heart disease and other diseases of the circulatory system: Secondary | ICD-10-CM | POA: Diagnosis not present

## 2022-02-17 DIAGNOSIS — E669 Obesity, unspecified: Secondary | ICD-10-CM | POA: Diagnosis present

## 2022-02-17 DIAGNOSIS — Z79899 Other long term (current) drug therapy: Secondary | ICD-10-CM

## 2022-02-17 DIAGNOSIS — Z91199 Patient's noncompliance with other medical treatment and regimen due to unspecified reason: Secondary | ICD-10-CM

## 2022-02-17 DIAGNOSIS — E1122 Type 2 diabetes mellitus with diabetic chronic kidney disease: Secondary | ICD-10-CM | POA: Diagnosis present

## 2022-02-17 DIAGNOSIS — Z820 Family history of epilepsy and other diseases of the nervous system: Secondary | ICD-10-CM

## 2022-02-17 DIAGNOSIS — I130 Hypertensive heart and chronic kidney disease with heart failure and stage 1 through stage 4 chronic kidney disease, or unspecified chronic kidney disease: Secondary | ICD-10-CM | POA: Diagnosis not present

## 2022-02-17 DIAGNOSIS — L0291 Cutaneous abscess, unspecified: Principal | ICD-10-CM

## 2022-02-17 DIAGNOSIS — K651 Peritoneal abscess: Secondary | ICD-10-CM | POA: Diagnosis not present

## 2022-02-17 DIAGNOSIS — E785 Hyperlipidemia, unspecified: Secondary | ICD-10-CM | POA: Diagnosis present

## 2022-02-17 DIAGNOSIS — N3289 Other specified disorders of bladder: Secondary | ICD-10-CM | POA: Diagnosis not present

## 2022-02-17 DIAGNOSIS — L03314 Cellulitis of groin: Secondary | ICD-10-CM | POA: Diagnosis present

## 2022-02-17 DIAGNOSIS — R600 Localized edema: Secondary | ICD-10-CM | POA: Diagnosis not present

## 2022-02-17 DIAGNOSIS — Z841 Family history of disorders of kidney and ureter: Secondary | ICD-10-CM

## 2022-02-17 DIAGNOSIS — Z6841 Body Mass Index (BMI) 40.0 and over, adult: Secondary | ICD-10-CM | POA: Diagnosis not present

## 2022-02-17 DIAGNOSIS — E1165 Type 2 diabetes mellitus with hyperglycemia: Secondary | ICD-10-CM | POA: Diagnosis present

## 2022-02-17 DIAGNOSIS — N182 Chronic kidney disease, stage 2 (mild): Secondary | ICD-10-CM | POA: Diagnosis not present

## 2022-02-17 DIAGNOSIS — A419 Sepsis, unspecified organism: Secondary | ICD-10-CM | POA: Diagnosis not present

## 2022-02-17 DIAGNOSIS — Z20822 Contact with and (suspected) exposure to covid-19: Secondary | ICD-10-CM | POA: Diagnosis not present

## 2022-02-17 DIAGNOSIS — Z7985 Long-term (current) use of injectable non-insulin antidiabetic drugs: Secondary | ICD-10-CM

## 2022-02-17 DIAGNOSIS — I509 Heart failure, unspecified: Secondary | ICD-10-CM | POA: Diagnosis not present

## 2022-02-17 DIAGNOSIS — E119 Type 2 diabetes mellitus without complications: Secondary | ICD-10-CM | POA: Diagnosis not present

## 2022-02-17 DIAGNOSIS — I69320 Aphasia following cerebral infarction: Secondary | ICD-10-CM

## 2022-02-17 DIAGNOSIS — I110 Hypertensive heart disease with heart failure: Secondary | ICD-10-CM | POA: Diagnosis not present

## 2022-02-17 DIAGNOSIS — Z8616 Personal history of COVID-19: Secondary | ICD-10-CM | POA: Diagnosis not present

## 2022-02-17 DIAGNOSIS — A4159 Other Gram-negative sepsis: Principal | ICD-10-CM | POA: Diagnosis present

## 2022-02-17 DIAGNOSIS — I428 Other cardiomyopathies: Secondary | ICD-10-CM

## 2022-02-17 DIAGNOSIS — I420 Dilated cardiomyopathy: Secondary | ICD-10-CM | POA: Diagnosis present

## 2022-02-17 DIAGNOSIS — I4891 Unspecified atrial fibrillation: Secondary | ICD-10-CM | POA: Diagnosis present

## 2022-02-17 DIAGNOSIS — K611 Rectal abscess: Secondary | ICD-10-CM | POA: Diagnosis present

## 2022-02-17 DIAGNOSIS — I10 Essential (primary) hypertension: Secondary | ICD-10-CM | POA: Diagnosis present

## 2022-02-17 DIAGNOSIS — I5022 Chronic systolic (congestive) heart failure: Secondary | ICD-10-CM | POA: Diagnosis not present

## 2022-02-17 DIAGNOSIS — Z7901 Long term (current) use of anticoagulants: Secondary | ICD-10-CM | POA: Diagnosis not present

## 2022-02-17 DIAGNOSIS — L02214 Cutaneous abscess of groin: Secondary | ICD-10-CM | POA: Diagnosis present

## 2022-02-17 DIAGNOSIS — L089 Local infection of the skin and subcutaneous tissue, unspecified: Secondary | ICD-10-CM | POA: Diagnosis not present

## 2022-02-17 DIAGNOSIS — E118 Type 2 diabetes mellitus with unspecified complications: Secondary | ICD-10-CM | POA: Diagnosis present

## 2022-02-17 DIAGNOSIS — I517 Cardiomegaly: Secondary | ICD-10-CM | POA: Diagnosis not present

## 2022-02-17 DIAGNOSIS — K449 Diaphragmatic hernia without obstruction or gangrene: Secondary | ICD-10-CM | POA: Diagnosis not present

## 2022-02-17 LAB — CBC WITH DIFFERENTIAL/PLATELET
Abs Immature Granulocytes: 0.07 10*3/uL (ref 0.00–0.07)
Basophils Absolute: 0 10*3/uL (ref 0.0–0.1)
Basophils Relative: 0 %
Eosinophils Absolute: 0.1 10*3/uL (ref 0.0–0.5)
Eosinophils Relative: 1 %
HCT: 45.5 % (ref 39.0–52.0)
Hemoglobin: 14.4 g/dL (ref 13.0–17.0)
Immature Granulocytes: 1 %
Lymphocytes Relative: 12 %
Lymphs Abs: 1.5 10*3/uL (ref 0.7–4.0)
MCH: 25.9 pg — ABNORMAL LOW (ref 26.0–34.0)
MCHC: 31.6 g/dL (ref 30.0–36.0)
MCV: 81.7 fL (ref 80.0–100.0)
Monocytes Absolute: 0.9 10*3/uL (ref 0.1–1.0)
Monocytes Relative: 7 %
Neutro Abs: 9.9 10*3/uL — ABNORMAL HIGH (ref 1.7–7.7)
Neutrophils Relative %: 79 %
Platelets: 281 10*3/uL (ref 150–400)
RBC: 5.57 MIL/uL (ref 4.22–5.81)
RDW: 12.8 % (ref 11.5–15.5)
WBC: 12.5 10*3/uL — ABNORMAL HIGH (ref 4.0–10.5)
nRBC: 0 % (ref 0.0–0.2)

## 2022-02-17 LAB — COMPREHENSIVE METABOLIC PANEL
ALT: 13 U/L (ref 0–44)
AST: 12 U/L — ABNORMAL LOW (ref 15–41)
Albumin: 3.1 g/dL — ABNORMAL LOW (ref 3.5–5.0)
Alkaline Phosphatase: 95 U/L (ref 38–126)
Anion gap: 11 (ref 5–15)
BUN: 14 mg/dL (ref 6–20)
CO2: 25 mmol/L (ref 22–32)
Calcium: 9.2 mg/dL (ref 8.9–10.3)
Chloride: 95 mmol/L — ABNORMAL LOW (ref 98–111)
Creatinine, Ser: 1.19 mg/dL (ref 0.61–1.24)
GFR, Estimated: >60 mL/min (ref >60)
Glucose, Bld: 467 mg/dL — ABNORMAL HIGH (ref 70–99)
Potassium: 4.4 mmol/L (ref 3.5–5.1)
Sodium: 131 mmol/L — ABNORMAL LOW (ref 135–145)
Total Bilirubin: 1.4 mg/dL — ABNORMAL HIGH (ref 0.3–1.2)
Total Protein: 7.9 g/dL (ref 6.5–8.1)

## 2022-02-17 LAB — URINALYSIS, ROUTINE W REFLEX MICROSCOPIC
Bacteria, UA: NONE SEEN
Bilirubin Urine: NEGATIVE
Glucose, UA: >=500 mg/dL — AB
Ketones, ur: 20 mg/dL — AB
Leukocytes,Ua: NEGATIVE
Nitrite: NEGATIVE
Protein, ur: NEGATIVE mg/dL
Specific Gravity, Urine: 1.028 (ref 1.005–1.030)
pH: 5 (ref 5.0–8.0)

## 2022-02-17 LAB — RESP PANEL BY RT-PCR (FLU A&B, COVID) ARPGX2
Influenza A by PCR: NEGATIVE
Influenza B by PCR: NEGATIVE
SARS Coronavirus 2 by RT PCR: NEGATIVE

## 2022-02-17 LAB — APTT: aPTT: 28 seconds (ref 24–36)

## 2022-02-17 LAB — PROTIME-INR
INR: 1.1 (ref 0.8–1.2)
Prothrombin Time: 14.5 seconds (ref 11.4–15.2)

## 2022-02-17 LAB — LACTIC ACID, PLASMA: Lactic Acid, Venous: 3 mmol/L (ref 0.5–1.9)

## 2022-02-17 LAB — CBG MONITORING, ED: Glucose-Capillary: 476 mg/dL — ABNORMAL HIGH (ref 70–99)

## 2022-02-17 MED ORDER — VANCOMYCIN HCL IN DEXTROSE 1-5 GM/200ML-% IV SOLN: 1000.0000 mg | Freq: Once | INTRAVENOUS | Status: DC

## 2022-02-17 MED ORDER — SODIUM CHLORIDE 0.9 % IV SOLN
2.0000 g | Freq: Once | INTRAVENOUS | Status: AC
  Administered 2022-02-18: 2 g via INTRAVENOUS
  Filled 2022-02-17: qty 12.5

## 2022-02-17 MED ORDER — CEFEPIME HCL 2 G IV SOLR
2.0000 g | Freq: Three times a day (TID) | INTRAVENOUS | Status: DC
  Administered 2022-02-18 – 2022-02-19 (×4): 2 g via INTRAVENOUS
  Filled 2022-02-17 (×4): qty 12.5

## 2022-02-17 MED ORDER — LACTATED RINGERS IV SOLN: INTRAVENOUS | Status: AC

## 2022-02-17 MED ORDER — LACTATED RINGERS IV BOLUS (SEPSIS)
2000.0000 mL | Freq: Once | INTRAVENOUS | Status: AC
  Administered 2022-02-18: 2000 mL via INTRAVENOUS

## 2022-02-17 MED ORDER — VANCOMYCIN HCL 1250 MG/250ML IV SOLN
1250.0000 mg | Freq: Two times a day (BID) | INTRAVENOUS | Status: DC
  Filled 2022-02-17 (×3): qty 250

## 2022-02-17 MED ORDER — METRONIDAZOLE 500 MG/100ML IV SOLN
500.0000 mg | Freq: Once | INTRAVENOUS | Status: AC
  Administered 2022-02-18: 500 mg via INTRAVENOUS
  Filled 2022-02-17: qty 100

## 2022-02-17 MED ORDER — VANCOMYCIN HCL 2000 MG/400ML IV SOLN
2000.0000 mg | Freq: Once | INTRAVENOUS | Status: AC
  Administered 2022-02-18: 2000 mg via INTRAVENOUS
  Filled 2022-02-17 (×2): qty 400

## 2022-02-17 NOTE — Sepsis Progress Note (Signed)
Following per sepsis protocol   

## 2022-02-17 NOTE — ED Notes (Signed)
Pt brought back to room from lobby.

## 2022-02-17 NOTE — ED Triage Notes (Signed)
Pt reported to ED with c/o boil on inner left thigh near groin that has been present from approximately 1 week. States that pain radiates down leg into scrotum.

## 2022-02-17 NOTE — ED Notes (Signed)
MD Madilyn Hook notified of lactic acid.

## 2022-02-17 NOTE — Progress Notes (Signed)
Pharmacy Antibiotic Note  Rodney Kane is a 45 y.o. male admitted on 02/17/2022 presenting with boil on inner left thigh near groin, concern for sepsis.  Pharmacy has been consulted for vancomycin and cefepime dosing.  Plan: Vancomycin 2000 mg IV x 1, then 1250 mg IV q 12h (eAUC 473, SCr 1.19) Cefepime 2g IV every 8 hours Monitor renal function, Cx and clinical progression to narrow Vancomycin levels as indicated     Temp (24hrs), Avg:99.9 F (37.7 C), Min:99.9 F (37.7 C), Max:99.9 F (37.7 C)  Recent Labs  Lab 02/17/22 2248  WBC 12.5*  CREATININE 1.19    CrCl cannot be calculated (Unknown ideal weight.).    No Known Allergies  Daylene Posey, PharmD Clinical Pharmacist ED Pharmacist Phone # [phone] 02/17/2022 11:26 PM

## 2022-02-17 NOTE — ED Provider Triage Note (Signed)
  Emergency Medicine Provider Triage Evaluation Note  MRN:  [id number]  Arrival date & time: 02/17/22    Medically screening exam initiated at 10:32 PM.   CC:   Abscess   HPI:  Rodney Kane is a 46 y.o. year-old male presents to the ED with chief complaint of abscess.  Onset 4 days ago. Tachycardic to 130s in triage.  Large tender perineal abscess. Hx of DM.  History provided by patient. ROS:  -As included in HPI PE:   Vitals:   02/17/22 2220  BP: (!) 141/100  Pulse: (!) 132  Resp: 18  Temp: 99.9 F (37.7 C)  SpO2: 100%    Uncomfortable appearing No respiratory distress Tender perineal abscess   MDM:  Based on signs and symptoms, perineal abscess need to rule out fournier's is highest on my differential. I've ordered sepsis labs, antibiotics, and a starter of fluid (lactic pending) in triage to expedite lab/diagnostic workup.  Patient was informed that the remainder of the evaluation will be completed by another provider, this initial triage assessment does not replace that evaluation, and the importance of remaining in the ED until their evaluation is complete.    Roxy Horseman, PA-C 02/17/22 2236

## 2022-02-18 ENCOUNTER — Other Ambulatory Visit: Payer: Self-pay

## 2022-02-18 ENCOUNTER — Inpatient Hospital Stay (HOSPITAL_COMMUNITY): Payer: BC Managed Care – PPO | Admitting: Certified Registered Nurse Anesthetist

## 2022-02-18 ENCOUNTER — Emergency Department (HOSPITAL_COMMUNITY): Payer: BC Managed Care – PPO

## 2022-02-18 ENCOUNTER — Encounter (HOSPITAL_COMMUNITY): Payer: Self-pay | Admitting: Internal Medicine

## 2022-02-18 ENCOUNTER — Encounter (HOSPITAL_COMMUNITY)
Admission: EM | Disposition: A | Discharge status: Home / Self Care | Payer: Self-pay | Source: Home / Self Care | Attending: Nurse Practitioner

## 2022-02-18 DIAGNOSIS — Z7901 Long term (current) use of anticoagulants: Secondary | ICD-10-CM | POA: Diagnosis not present

## 2022-02-18 DIAGNOSIS — L02214 Cutaneous abscess of groin: Secondary | ICD-10-CM | POA: Diagnosis present

## 2022-02-18 DIAGNOSIS — Z8249 Family history of ischemic heart disease and other diseases of the circulatory system: Secondary | ICD-10-CM | POA: Diagnosis not present

## 2022-02-18 DIAGNOSIS — L03314 Cellulitis of groin: Secondary | ICD-10-CM | POA: Diagnosis present

## 2022-02-18 DIAGNOSIS — Z6841 Body Mass Index (BMI) 40.0 and over, adult: Secondary | ICD-10-CM | POA: Diagnosis not present

## 2022-02-18 DIAGNOSIS — Z20822 Contact with and (suspected) exposure to covid-19: Secondary | ICD-10-CM | POA: Diagnosis present

## 2022-02-18 DIAGNOSIS — Z91199 Patient's noncompliance with other medical treatment and regimen due to unspecified reason: Secondary | ICD-10-CM | POA: Diagnosis not present

## 2022-02-18 DIAGNOSIS — E1165 Type 2 diabetes mellitus with hyperglycemia: Secondary | ICD-10-CM | POA: Diagnosis present

## 2022-02-18 DIAGNOSIS — Z7985 Long-term (current) use of injectable non-insulin antidiabetic drugs: Secondary | ICD-10-CM | POA: Diagnosis not present

## 2022-02-18 DIAGNOSIS — K611 Rectal abscess: Secondary | ICD-10-CM | POA: Diagnosis present

## 2022-02-18 DIAGNOSIS — A419 Sepsis, unspecified organism: Secondary | ICD-10-CM

## 2022-02-18 DIAGNOSIS — Z841 Family history of disorders of kidney and ureter: Secondary | ICD-10-CM | POA: Diagnosis not present

## 2022-02-18 DIAGNOSIS — I69320 Aphasia following cerebral infarction: Secondary | ICD-10-CM | POA: Diagnosis not present

## 2022-02-18 DIAGNOSIS — Z820 Family history of epilepsy and other diseases of the nervous system: Secondary | ICD-10-CM | POA: Diagnosis not present

## 2022-02-18 DIAGNOSIS — N182 Chronic kidney disease, stage 2 (mild): Secondary | ICD-10-CM | POA: Diagnosis present

## 2022-02-18 DIAGNOSIS — I420 Dilated cardiomyopathy: Secondary | ICD-10-CM | POA: Diagnosis present

## 2022-02-18 DIAGNOSIS — E669 Obesity, unspecified: Secondary | ICD-10-CM | POA: Diagnosis present

## 2022-02-18 DIAGNOSIS — I130 Hypertensive heart and chronic kidney disease with heart failure and stage 1 through stage 4 chronic kidney disease, or unspecified chronic kidney disease: Secondary | ICD-10-CM | POA: Diagnosis present

## 2022-02-18 DIAGNOSIS — E785 Hyperlipidemia, unspecified: Secondary | ICD-10-CM | POA: Diagnosis present

## 2022-02-18 DIAGNOSIS — I4891 Unspecified atrial fibrillation: Secondary | ICD-10-CM | POA: Diagnosis present

## 2022-02-18 DIAGNOSIS — Z8616 Personal history of COVID-19: Secondary | ICD-10-CM | POA: Diagnosis not present

## 2022-02-18 DIAGNOSIS — E1122 Type 2 diabetes mellitus with diabetic chronic kidney disease: Secondary | ICD-10-CM | POA: Diagnosis present

## 2022-02-18 DIAGNOSIS — I5022 Chronic systolic (congestive) heart failure: Secondary | ICD-10-CM | POA: Diagnosis present

## 2022-02-18 DIAGNOSIS — A4159 Other Gram-negative sepsis: Secondary | ICD-10-CM | POA: Diagnosis present

## 2022-02-18 DIAGNOSIS — Z79899 Other long term (current) drug therapy: Secondary | ICD-10-CM | POA: Diagnosis not present

## 2022-02-18 HISTORY — PX: INCISION AND DRAINAGE PERIRECTAL ABSCESS: SHX1804

## 2022-02-18 HISTORY — PX: IRRIGATION AND DEBRIDEMENT ABSCESS: SHX5252

## 2022-02-18 LAB — GLUCOSE, CAPILLARY
Glucose-Capillary: 278 mg/dL — ABNORMAL HIGH (ref 70–99)
Glucose-Capillary: 209 mg/dL — ABNORMAL HIGH (ref 70–99)
Glucose-Capillary: 179 mg/dL — ABNORMAL HIGH (ref 70–99)
Glucose-Capillary: 119 mg/dL — ABNORMAL HIGH (ref 70–99)
Glucose-Capillary: 201 mg/dL — ABNORMAL HIGH (ref 70–99)

## 2022-02-18 LAB — TSH: TSH: 1.326 u[IU]/mL (ref 0.350–4.500)

## 2022-02-18 LAB — LACTIC ACID, PLASMA: Lactic Acid, Venous: 2 mmol/L (ref 0.5–1.9)

## 2022-02-18 LAB — HIV ANTIBODY (ROUTINE TESTING W REFLEX): HIV Screen 4th Generation wRfx: NONREACTIVE

## 2022-02-18 LAB — CBG MONITORING, ED: Glucose-Capillary: 304 mg/dL — ABNORMAL HIGH (ref 70–99)

## 2022-02-18 SURGERY — INCISION AND DRAINAGE, ABSCESS, PERIRECTAL
Anesthesia: General | Site: Rectum

## 2022-02-18 MED ORDER — INSULIN ASPART 100 UNIT/ML IJ SOLN
INTRAMUSCULAR | Status: AC
  Filled 2022-02-18: qty 1

## 2022-02-18 MED ORDER — 0.9 % SODIUM CHLORIDE (POUR BTL) OPTIME
TOPICAL | Status: DC | PRN
  Administered 2022-02-18: 1000 mL

## 2022-02-18 MED ORDER — HYDROMORPHONE HCL 1 MG/ML IJ SOLN
0.2500 mg | INTRAMUSCULAR | Status: DC | PRN
  Administered 2022-02-18: 0.5 mg via INTRAVENOUS

## 2022-02-18 MED ORDER — CHLORHEXIDINE GLUCONATE 0.12 % MT SOLN
OROMUCOSAL | Status: AC
  Administered 2022-02-18: 15 mL via OROMUCOSAL
  Filled 2022-02-18: qty 15

## 2022-02-18 MED ORDER — CHLORHEXIDINE GLUCONATE 0.12 % MT SOLN: 15.0000 mL | Freq: Once | OROMUCOSAL | Status: AC

## 2022-02-18 MED ORDER — MIDAZOLAM HCL 2 MG/2ML IJ SOLN
INTRAMUSCULAR | Status: DC | PRN
  Administered 2022-02-18: 2 mg via INTRAVENOUS

## 2022-02-18 MED ORDER — INSULIN DETEMIR 100 UNIT/ML ~~LOC~~ SOLN
10.0000 [IU] | Freq: Two times a day (BID) | SUBCUTANEOUS | Status: DC
  Administered 2022-02-18 – 2022-02-19 (×2): 10 [IU] via SUBCUTANEOUS
  Filled 2022-02-18 (×3): qty 0.1

## 2022-02-18 MED ORDER — FENTANYL CITRATE (PF) 250 MCG/5ML IJ SOLN
INTRAMUSCULAR | Status: DC | PRN
  Administered 2022-02-18: 50 ug via INTRAVENOUS
  Administered 2022-02-18: 100 ug via INTRAVENOUS

## 2022-02-18 MED ORDER — OXYCODONE HCL 5 MG PO TABS
5.0000 mg | ORAL_TABLET | ORAL | Status: DC | PRN
  Administered 2022-02-19: 5 mg via ORAL
  Filled 2022-02-18: qty 1

## 2022-02-18 MED ORDER — LIDOCAINE 2% (20 MG/ML) 5 ML SYRINGE
INTRAMUSCULAR | Status: DC | PRN
  Administered 2022-02-18: 60 mg via INTRAVENOUS

## 2022-02-18 MED ORDER — ORAL CARE MOUTH RINSE: 15.0000 mL | Freq: Once | OROMUCOSAL | Status: AC

## 2022-02-18 MED ORDER — FENTANYL CITRATE (PF) 250 MCG/5ML IJ SOLN
INTRAMUSCULAR | Status: AC
  Filled 2022-02-18: qty 5

## 2022-02-18 MED ORDER — INSULIN ASPART 100 UNIT/ML IJ SOLN
0.0000 [IU] | INTRAMUSCULAR | Status: DC
  Administered 2022-02-18: 7 [IU] via SUBCUTANEOUS
  Administered 2022-02-18: 15 [IU] via SUBCUTANEOUS
  Administered 2022-02-19 (×3): 4 [IU] via SUBCUTANEOUS
  Administered 2022-02-19: 3 [IU] via SUBCUTANEOUS

## 2022-02-18 MED ORDER — ROCURONIUM BROMIDE 10 MG/ML (PF) SYRINGE
PREFILLED_SYRINGE | INTRAVENOUS | Status: DC | PRN
  Administered 2022-02-18: 70 mg via INTRAVENOUS

## 2022-02-18 MED ORDER — PROPOFOL 10 MG/ML IV BOLUS
INTRAVENOUS | Status: AC
  Filled 2022-02-18: qty 20

## 2022-02-18 MED ORDER — LIDOCAINE 2% (20 MG/ML) 5 ML SYRINGE
INTRAMUSCULAR | Status: AC
Start: 2022-02-18 — End: ?
  Filled 2022-02-18: qty 10

## 2022-02-18 MED ORDER — IOHEXOL 300 MG/ML  SOLN
100.0000 mL | Freq: Once | INTRAMUSCULAR | Status: AC | PRN
  Administered 2022-02-18: 100 mL via INTRAVENOUS

## 2022-02-18 MED ORDER — METOPROLOL SUCCINATE ER 25 MG PO TB24
50.0000 mg | ORAL_TABLET | Freq: Every day | ORAL | Status: DC
  Administered 2022-02-18: 50 mg via ORAL
  Filled 2022-02-18: qty 2

## 2022-02-18 MED ORDER — INSULIN ASPART 100 UNIT/ML IJ SOLN
0.0000 [IU] | INTRAMUSCULAR | Status: AC | PRN
  Administered 2022-02-18: 2 [IU] via SUBCUTANEOUS
  Administered 2022-02-18: 8 [IU] via SUBCUTANEOUS

## 2022-02-18 MED ORDER — METOPROLOL TARTRATE 12.5 MG HALF TABLET
12.5000 mg | ORAL_TABLET | Freq: Two times a day (BID) | ORAL | Status: DC
  Administered 2022-02-18 – 2022-02-19 (×2): 12.5 mg via ORAL
  Filled 2022-02-18 (×2): qty 1

## 2022-02-18 MED ORDER — LOSARTAN POTASSIUM 25 MG PO TABS
25.0000 mg | ORAL_TABLET | Freq: Every day | ORAL | Status: DC
Start: 2022-02-19 — End: 2022-02-19
  Administered 2022-02-19: 25 mg via ORAL
  Filled 2022-02-18: qty 1

## 2022-02-18 MED ORDER — HYDROMORPHONE HCL 1 MG/ML IJ SOLN
INTRAMUSCULAR | Status: AC
  Filled 2022-02-18: qty 1

## 2022-02-18 MED ORDER — MORPHINE SULFATE (PF) 2 MG/ML IV SOLN: 2.0000 mg | INTRAVENOUS | Status: DC | PRN

## 2022-02-18 MED ORDER — ONDANSETRON HCL 4 MG/2ML IJ SOLN
INTRAMUSCULAR | Status: AC
  Filled 2022-02-18: qty 2

## 2022-02-18 MED ORDER — LACTATED RINGERS IV SOLN: INTRAVENOUS | Status: DC

## 2022-02-18 MED ORDER — DIPHENHYDRAMINE HCL 50 MG/ML IJ SOLN
INTRAMUSCULAR | Status: DC | PRN
  Administered 2022-02-18: 12.5 mg via INTRAVENOUS

## 2022-02-18 MED ORDER — SUGAMMADEX SODIUM 200 MG/2ML IV SOLN
INTRAVENOUS | Status: DC | PRN
  Administered 2022-02-18: 400 mg via INTRAVENOUS
  Administered 2022-02-18: 100 mg via INTRAVENOUS

## 2022-02-18 MED ORDER — ONDANSETRON HCL 4 MG/2ML IJ SOLN
INTRAMUSCULAR | Status: DC | PRN
  Administered 2022-02-18: 4 mg via INTRAVENOUS

## 2022-02-18 MED ORDER — DEXAMETHASONE SODIUM PHOSPHATE 10 MG/ML IJ SOLN
INTRAMUSCULAR | Status: AC
  Filled 2022-02-18: qty 1

## 2022-02-18 MED ORDER — ACETAMINOPHEN 325 MG PO TABS: 650.0000 mg | ORAL_TABLET | Freq: Four times a day (QID) | ORAL | Status: DC | PRN

## 2022-02-18 MED ORDER — OXYCODONE HCL 5 MG PO TABS: 5.0000 mg | ORAL_TABLET | Freq: Once | ORAL | Status: DC | PRN

## 2022-02-18 MED ORDER — MIDAZOLAM HCL 2 MG/2ML IJ SOLN
INTRAMUSCULAR | Status: AC
  Filled 2022-02-18: qty 2

## 2022-02-18 MED ORDER — DOCUSATE SODIUM 100 MG PO CAPS
100.0000 mg | ORAL_CAPSULE | Freq: Two times a day (BID) | ORAL | Status: DC
Start: 2022-02-18 — End: 2022-02-19
  Administered 2022-02-18 – 2022-02-19 (×2): 100 mg via ORAL
  Filled 2022-02-18 (×2): qty 1

## 2022-02-18 MED ORDER — INSULIN DETEMIR 100 UNIT/ML ~~LOC~~ SOLN
20.0000 [IU] | Freq: Two times a day (BID) | SUBCUTANEOUS | Status: DC
  Filled 2022-02-18 (×2): qty 0.2

## 2022-02-18 MED ORDER — PHENYLEPHRINE 80 MCG/ML (10ML) SYRINGE FOR IV PUSH (FOR BLOOD PRESSURE SUPPORT)
PREFILLED_SYRINGE | INTRAVENOUS | Status: AC
Start: 2022-02-18 — End: ?
  Filled 2022-02-18: qty 10

## 2022-02-18 MED ORDER — PROPOFOL 10 MG/ML IV BOLUS
INTRAVENOUS | Status: DC | PRN
  Administered 2022-02-18: 140 mg via INTRAVENOUS

## 2022-02-18 MED ORDER — OXYCODONE HCL 5 MG/5ML PO SOLN: 5.0000 mg | Freq: Once | ORAL | Status: DC | PRN

## 2022-02-18 MED ORDER — ACETAMINOPHEN 650 MG RE SUPP: 650.0000 mg | Freq: Four times a day (QID) | RECTAL | Status: DC | PRN

## 2022-02-18 MED ORDER — DIPHENHYDRAMINE HCL 50 MG/ML IJ SOLN
INTRAMUSCULAR | Status: AC
  Filled 2022-02-18: qty 1

## 2022-02-18 MED ORDER — ROCURONIUM BROMIDE 10 MG/ML (PF) SYRINGE
PREFILLED_SYRINGE | INTRAVENOUS | Status: AC
  Filled 2022-02-18: qty 10

## 2022-02-18 SURGICAL SUPPLY — 30 items
BAG COUNTER SPONGE SURGICOUNT (BAG) ×3 IMPLANT
CANISTER SUCT 3000ML PPV (MISCELLANEOUS) ×3 IMPLANT
COVER SURGICAL LIGHT HANDLE (MISCELLANEOUS) ×3 IMPLANT
DRAPE UTILITY XL STRL (DRAPES) ×3 IMPLANT
DRSG PAD ABDOMINAL 8X10 ST (GAUZE/BANDAGES/DRESSINGS) ×3 IMPLANT
ELECT REM PT RETURN 9FT ADLT (ELECTROSURGICAL)
ELECTRODE REM PT RTRN 9FT ADLT (ELECTROSURGICAL) IMPLANT
GAUZE 4X4 16PLY ~~LOC~~+RFID DBL (SPONGE) ×3 IMPLANT
GAUZE PACKING IODOFORM 1X5 (PACKING) ×1 IMPLANT
GAUZE SPONGE 4X4 12PLY STRL (GAUZE/BANDAGES/DRESSINGS) ×3 IMPLANT
GLOVE BIO SURGEON STRL SZ8 (GLOVE) ×3 IMPLANT
GLOVE BIOGEL PI IND STRL 8 (GLOVE) ×2 IMPLANT
GLOVE BIOGEL PI INDICATOR 8 (GLOVE) ×1
GOWN STRL REUS W/ TWL LRG LVL3 (GOWN DISPOSABLE) ×4 IMPLANT
GOWN STRL REUS W/ TWL XL LVL3 (GOWN DISPOSABLE) ×2 IMPLANT
GOWN STRL REUS W/TWL LRG LVL3 (GOWN DISPOSABLE) ×2
GOWN STRL REUS W/TWL XL LVL3 (GOWN DISPOSABLE) ×1
KIT BASIN OR (CUSTOM PROCEDURE TRAY) ×3 IMPLANT
KIT TURNOVER KIT B (KITS) ×3 IMPLANT
NS IRRIG 1000ML POUR BTL (IV SOLUTION) ×3 IMPLANT
PACK LITHOTOMY IV (CUSTOM PROCEDURE TRAY) ×3 IMPLANT
PAD ARMBOARD 7.5X6 YLW CONV (MISCELLANEOUS) ×6 IMPLANT
PENCIL SMOKE EVACUATOR (MISCELLANEOUS) IMPLANT
SWAB COLLECTION DEVICE MRSA (MISCELLANEOUS) ×3 IMPLANT
SWAB CULTURE ESWAB REG 1ML (MISCELLANEOUS) ×3 IMPLANT
TOWEL GREEN STERILE (TOWEL DISPOSABLE) ×3 IMPLANT
TOWEL GREEN STERILE FF (TOWEL DISPOSABLE) ×3 IMPLANT
TUBE CONNECTING 12X1/4 (SUCTIONS) ×3 IMPLANT
UNDERPAD 30X36 HEAVY ABSORB (UNDERPADS AND DIAPERS) ×3 IMPLANT
YANKAUER SUCT BULB TIP NO VENT (SUCTIONS) ×3 IMPLANT

## 2022-02-18 NOTE — ED Provider Notes (Signed)
[facility] EMERGENCY DEPARTMENT Provider Note   CSN: [id number] Arrival date & time: 02/17/22  2156     History  Chief Complaint  Patient presents with   Abscess    Rodney Kane is a 46 y.o. male.  The history is provided by the patient and medical records.  Abscess Rodney Kane is a 46 y.o. male who presents to the Emergency Department complaining of abscess.  He presents to the emergency department for evaluation of swelling and pain to the left inguinal region that started about 1 week ago.  He states that when it started he had immediate drainage from the area.  He has been feeling unwell for the last few days.  No fevers at home.  He states the drainage smells very foul.  Has a history of hypertension, diabetes, CVA     Home Medications Prior to Admission medications   Medication Sig Start Date End Date Taking? Authorizing Provider  atorvastatin (LIPITOR) 40 MG tablet Take 1 tablet (40 mg total) by mouth daily. 12/05/21   Ivonne Andrew, NP  glipiZIDE (GLUCOTROL) 10 MG tablet Take 1 tablet (10 mg total) by mouth 2 (two) times daily before a meal. 12/05/21   Ivonne Andrew, NP  hydrocortisone cream 1 % Apply 1 application topically 2 (two) times daily. Patient not taking: Reported on 12/16/2021 05/10/21   Barbette Merino, NP  hydrOXYzine (ATARAX/VISTARIL) 10 MG tablet Take 1 tablet (10 mg total) by mouth 3 (three) times daily as needed. Patient taking differently: Take 10 mg by mouth 3 (three) times daily as needed for anxiety. 09/19/20   Kallie Locks, FNP  losartan (COZAAR) 100 MG tablet Take 1 tablet (100 mg total) by mouth daily. 12/05/21   Ivonne Andrew, NP  metoprolol succinate (TOPROL-XL) 50 MG 24 hr tablet Take 1 tablet (50 mg total) by mouth daily. Take with or immediately following a meal. 12/05/21 03/05/22  Ivonne Andrew, NP  polyethylene glycol powder (GLYCOLAX/MIRALAX) 17 GM/SCOOP powder Take 17 g by mouth daily. Patient  not taking: Reported on 12/16/2021 08/29/20   Kallie Locks, FNP  potassium chloride SA (KLOR-CON M) 20 MEQ tablet Take 1 tablet (20 mEq total) by mouth daily. 12/18/21   [name], MD  rivaroxaban (XARELTO) 20 MG TABS tablet TAKE 1 TABLET (20 MG TOTAL) BY MOUTH DAILY WITH SUPPER. Patient not taking: Reported on 12/16/2021 09/26/20 09/26/21  Inez Catalina, MD  sitaGLIPtin (JANUVIA) 25 MG tablet Take 1 tablet (25 mg total) by mouth daily. Patient not taking: Reported on 12/16/2021 12/05/21   Ivonne Andrew, NP  spironolactone (ALDACTONE) 25 MG tablet Take 1 tablet (25 mg total) by mouth daily. 12/05/21 12/05/22  Ivonne Andrew, NP  torsemide (DEMADEX) 20 MG tablet Take 2 tablets (40 mg total) by mouth daily. 12/05/21   Ivonne Andrew, NP      Allergies    Patient has no known allergies.    Review of Systems   Review of Systems  All other systems reviewed and are negative.   Physical Exam Updated Vital Signs BP 123/82   Pulse 90   Temp 99.1 F (37.3 C) (Oral)   Resp 18   SpO2 100%  Physical Exam Vitals and nursing note reviewed.  Constitutional:      Appearance: He is well-developed.  HENT:     Head: Normocephalic and atraumatic.  Cardiovascular:     Rate and Rhythm: Regular rhythm. Tachycardia present.  Heart sounds: No murmur heard. Pulmonary:     Effort: Pulmonary effort is normal. No respiratory distress.     Breath sounds: Normal breath sounds.  Abdominal:     Palpations: Abdomen is soft.     Tenderness: There is no abdominal tenderness. There is no guarding or rebound.  Genitourinary:    Comments: There is a large area of soft tissue swelling, erythema and tenderness to the left upper thigh with a focal fluctuant abscess to the left perineum that is about 2 x 2 in size with focal drainage.  There is induration and tenderness to the left upper scrotal sac without involvement of the testicle. Musculoskeletal:        General: No tenderness.  Skin:    General:  Skin is warm and dry.  Neurological:     Mental Status: He is alert and oriented to person, place, and time.  Psychiatric:        Behavior: Behavior normal.      ED Results / Procedures / Treatments   Labs (all labs ordered are listed, but only abnormal results are displayed) Labs Reviewed  LACTIC ACID, PLASMA - Abnormal; Notable for the following components:      Result Value   Lactic Acid, Venous 3.0 (*)    All other components within normal limits  LACTIC ACID, PLASMA - Abnormal; Notable for the following components:   Lactic Acid, Venous 2.0 (*)    All other components within normal limits  COMPREHENSIVE METABOLIC PANEL - Abnormal; Notable for the following components:   Sodium 131 (*)    Chloride 95 (*)    Glucose, Bld 467 (*)    Albumin 3.1 (*)    AST 12 (*)    Total Bilirubin 1.4 (*)    All other components within normal limits  CBC WITH DIFFERENTIAL/PLATELET - Abnormal; Notable for the following components:   WBC 12.5 (*)    MCH 25.9 (*)    Neutro Abs 9.9 (*)    All other components within normal limits  URINALYSIS, ROUTINE W REFLEX MICROSCOPIC - Abnormal; Notable for the following components:   Color, Urine STRAW (*)    Glucose, UA >=500 (*)    Hgb urine dipstick SMALL (*)    Ketones, ur 20 (*)    All other components within normal limits  CBG MONITORING, ED - Abnormal; Notable for the following components:   Glucose-Capillary 476 (*)    All other components within normal limits  RESP PANEL BY RT-PCR (FLU A&B, COVID) ARPGX2  CULTURE, BLOOD (ROUTINE X 2)  CULTURE, BLOOD (ROUTINE X 2)  PROTIME-INR  APTT    EKG EKG Interpretation  Date/Time:  Monday February 17 2022 22:41:13 EDT Ventricular Rate:  138 PR Interval:  122 QRS Duration: 80 QT Interval:  294 QTC Calculation: 445 R Axis:   63 Text Interpretation: Sinus tachycardia Otherwise normal ECG Confirmed by Tilden Fossa [phone]) on 02/18/2022 12:25:41 AM  Radiology CT PELVIS W CONTRAST  Result Date:  02/18/2022 CLINICAL DATA:  Peroneal abscess EXAM: CT PELVIS WITH CONTRAST TECHNIQUE: Multidetector CT imaging of the pelvis was performed using the standard protocol following the bolus administration of intravenous contrast. RADIATION DOSE REDUCTION: This exam was performed according to the departmental dose-optimization program which includes automated exposure control, adjustment of the mA and/or kV according to patient size and/or use of iterative reconstruction technique. CONTRAST:  OMNIPAQUE IOHEXOL 300 MG/ML  SOLN COMPARISON:  None Available. FINDINGS: Urinary Tract: Bladder is well distended. Kidneys are  not well visualized on this exam. Bowel: Visualized bowel shows no acute abnormality. The appendix is within normal limits. Vascular/Lymphatic: Mild atherosclerotic calcifications are seen. Reactive left inguinal lymph nodes are seen Reproductive:  Prostate is within normal limits. Other:  No free fluid is seen.  No focal herniation is noted. Musculoskeletal: In the perineum eccentric to the left, there is a focal air-fluid collection identified which measures 6.7 cm in greatest AP dimension and 2.5 cm in transverse dimension. Craniocaudad extension is approximately 4 cm. These changes are consistent with a subcutaneous abscess. Some surrounding edema is noted. No involvement of the scrotum is seen. The edema extends into the medial aspect of the left calf. No bony abnormality is noted. IMPRESSION: Changes consistent with subcutaneous abscess in the perineum on the left with some surrounding edema. No scrotal involvement is seen at this time. No extension into the perirectal or perianal region is seen. Electronically Signed   By: Alcide Clever M.D.   On: 02/18/2022 01:15   DG Chest Port 1 View  Result Date: 02/18/2022 CLINICAL DATA:  Questionable sepsis. EXAM: PORTABLE CHEST 1 VIEW COMPARISON:  Chest radiograph dated 02/02/2021. FINDINGS: Mild eventration of the right hemidiaphragm. No focal  consolidation, pleural effusion or pneumothorax. Mild cardiomegaly. No acute osseous pathology. IMPRESSION: No active cardiopulmonary disease.  Five Electronically Signed   By: Elgie Collard M.D.   On: 02/18/2022 00:01    Procedures Procedures   CRITICAL CARE Performed by: Tilden Fossa   Total critical care time: 45 minutes  Critical care time was exclusive of separately billable procedures and treating other patients.  Critical care was necessary to treat or prevent imminent or life-threatening deterioration.  Critical care was time spent personally by me on the following activities: development of treatment plan with patient and/or surrogate as well as nursing, discussions with consultants, evaluation of patient's response to treatment, examination of patient, obtaining history from patient or surrogate, ordering and performing treatments and interventions, ordering and review of laboratory studies, ordering and review of radiographic studies, pulse oximetry and re-evaluation of patient's condition.  Medications Ordered in ED Medications  lactated ringers infusion ( Intravenous New Bag/Given 02/18/22 0000)  ceFEPIme (MAXIPIME) 2 g in sodium chloride 0.9 % 100 mL IVPB (2 g Intravenous New Bag/Given 02/18/22 0721)  vancomycin (VANCOREADY) IVPB 1250 mg/250 mL (has no administration in time range)  lactated ringers bolus 2,000 mL (0 mLs Intravenous Stopped 02/18/22 0304)  ceFEPIme (MAXIPIME) 2 g in sodium chloride 0.9 % 100 mL IVPB (0 g Intravenous Stopped 02/18/22 0212)  metroNIDAZOLE (FLAGYL) IVPB 500 mg (0 mg Intravenous Stopped 02/18/22 0212)  vancomycin (VANCOREADY) IVPB 2000 mg/400 mL (0 mg Intravenous Stopped 02/18/22 0453)  iohexol (OMNIPAQUE) 300 MG/ML solution 100 mL (100 mLs Intravenous Contrast Given 02/18/22 0108)    ED Course/ Medical Decision Making/ A&P                           Medical Decision Making Risk Decision regarding hospitalization.   Patient with history of  diabetes, CVA here for evaluation of groin swelling and pain.  He is tachycardic, febrile at time of ED presentation.  Examination with large amount of cellulitis to the groin with focal draining from local abscess.  He was started on broad-spectrum antibiotics for presumed infection.  His CT scan is consistent with acute abscess in the left perineal region.  Discussed with Dr. Donell Beers with general surgery-will see the patient in consult.  Medicine  consulted for admission for ongoing treatment.        Final Clinical Impression(s) / ED Diagnoses Final diagnoses:  Abscess  Cellulitis of groin    Rx / DC Orders ED Discharge Orders     None         Tilden Fossa, MD 02/18/22 [phone]

## 2022-02-18 NOTE — Progress Notes (Signed)
Exudate patient with cellulitis/abscess of the right groin.  Patient diabetic.  Surgery has been consulted by ER physician

## 2022-02-18 NOTE — Consult Note (Addendum)
Reason for Consult:groin abscess Referring Physician: Zannie Cove [name] is an 46 y.o. male.  HPI:  Pt is a 46 yo M who presents with a groin abscess.  He has felt poorly for around a week and started having perirectal and groin pain.  He developed swelling and redness and pain increased.  He started having fevers.  He has had some drainage from the area nearest the rectum, but still has a large abscess.  He denies trauma or shortness of breath.  He has had an abscess in a similar location, but it has been 10-20 years ago.    He has h/o DM, CHF, CVA secondary to atrial fibrillation.   Past Medical History:  Diagnosis Date   Coronavirus infection 06/2020   COVID 06/2020   Diabetes mellitus without complication (HCC)    Hemoglobin A1C greater than 9%, indicating poor diabetic control 12/2019   Hyperglycemia 12/2019   Hyperlipidemia 12/2019   Hypertension    Hypertensive urgency 12/2019   Noncompliance with medication regimen 12/2019   Nonischemic (hypertensive) dilated cardiomyopathy ([place]) 12/2016   EF has been 30 to 35%, recently lower.   Stroke [facility])    Urine ketones 12/2019   Vitamin D deficiency 12/2019    Past Surgical History:  Procedure Laterality Date   left knee surgery     RIGHT/LEFT HEART CATH AND CORONARY ANGIOGRAPHY N/A 01/05/2017   Procedure: Right/Left Heart Cath and Coronary Angiography;  Surgeon: Leonie Man, MD;  Location: [facility];  Service: Cardiovascular; angiograph normal coronaries.  PAP mean 19 million mercury, PCWP 15 mmHg.  LVEDP 9 mmHg.  Cardiac output/index 7.84/2.7.  RAP mean 4 mmHg.   TRANSTHORACIC ECHOCARDIOGRAM  01/04/2017   EF 30 to 35%.  Moderate LVH.  Moderate severely reduced EF.  Diffuse HK.  Normal valves.   TRANSTHORACIC ECHOCARDIOGRAM  07/20/2020    Severely reduced LV function-EF 20 to 25%, global HK.  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  Mildly reduced cardiac function.  Unable to assess  PA pressures.  Moderate LA dilation.  Small pericardial effusion.  Relatively normal mitral valve and tricuspid valve.  Normal RA pressures.    Family History  Problem Relation Age of Onset   Multiple sclerosis Mother    Diabetic kidney disease Mother    Hypertension Mother     Social History:  reports that he has never smoked. He has never used smokeless tobacco. He reports that he does not drink alcohol and does not use drugs.  Allergies: No Known Allergies  Medications:  torsemide (DEMADEX) 20 MG tablet  atorvastatin (LIPITOR) 40 MG tablet  glipiZIDE (GLUCOTROL) 10 MG tablet  losartan (COZAAR) 100 MG tablet  metoprolol succinate (TOPROL-XL) 50 MG 24 hr tablet  spironolactone (ALDACTONE) 25 MG tablet   Results for orders placed or performed during the hospital encounter of 02/17/22 (from the past 48 hour(s))  Resp Panel by RT-PCR (Flu A&B, Covid) Peripheral     Status: None   Collection Time: 02/17/22 10:31 PM   Specimen: Peripheral; Nasal Swab  Result Value Ref Range   SARS Coronavirus 2 by RT PCR NEGATIVE NEGATIVE    Comment: (NOTE) SARS-CoV-2 target nucleic acids are NOT DETECTED.  The SARS-CoV-2 RNA is generally detectable in upper respiratory specimens during the acute phase of infection. The lowest concentration of SARS-CoV-2 viral copies this assay can detect is 138 copies/mL. A negative result does not preclude SARS-Cov-2 infection and should not be used as  the sole basis for treatment or other patient management decisions. A negative result may occur with  improper specimen collection/handling, submission of specimen other than nasopharyngeal swab, presence of viral mutation(s) within the areas targeted by this assay, and inadequate number of viral copies(<138 copies/mL). A negative result must be combined with clinical observations, patient history, and epidemiological information. The expected result is Negative.  Fact Sheet for Patients:   BloggerCourse.com  Fact Sheet for Healthcare Providers:  SeriousBroker.it  This test is no t yet approved or cleared by the [place] FDA and  has been authorized for detection and/or diagnosis of SARS-CoV-2 by FDA under an Emergency Use Authorization (EUA). This EUA will remain  in effect (meaning this test can be used) for the duration of the COVID-19 declaration under Section 564(b)(1) of the Act, 21 U.S.C.section 360bbb-3(b)(1), unless the authorization is terminated  or revoked sooner.       Influenza A by PCR NEGATIVE NEGATIVE   Influenza B by PCR NEGATIVE NEGATIVE    Comment: (NOTE) The Xpert Xpress SARS-CoV-2/FLU/RSV plus assay is intended as an aid in the diagnosis of influenza from Nasopharyngeal swab specimens and should not be used as a sole basis for treatment. Nasal washings and aspirates are unacceptable for Xpert Xpress SARS-CoV-2/FLU/RSV testing.  Fact Sheet for Patients: BloggerCourse.com  Fact Sheet for Healthcare Providers: SeriousBroker.it  This test is not yet approved or cleared by the [place] FDA and has been authorized for detection and/or diagnosis of SARS-CoV-2 by FDA under an Emergency Use Authorization (EUA). This EUA will remain in effect (meaning this test can be used) for the duration of the COVID-19 declaration under Section 564(b)(1) of the Act, 21 U.S.C. section 360bbb-3(b)(1), unless the authorization is terminated or revoked.  Performed at [facility], [address] [place], [place] [address]   Urinalysis, Routine w reflex microscopic Peripheral     Status: Abnormal   Collection Time: 02/17/22 10:31 PM  Result Value Ref Range   Color, Urine STRAW (A) YELLOW   APPearance CLEAR CLEAR   Specific Gravity, Urine 1.028 1.005 - 1.030   pH 5.0 5.0 - 8.0   Glucose, UA >=500 (A) NEGATIVE mg/dL   Hgb urine dipstick SMALL (A)  NEGATIVE   Bilirubin Urine NEGATIVE NEGATIVE   Ketones, ur 20 (A) NEGATIVE mg/dL   Protein, ur NEGATIVE NEGATIVE mg/dL   Nitrite NEGATIVE NEGATIVE   Leukocytes,Ua NEGATIVE NEGATIVE   RBC / HPF 0-5 0 - 5 RBC/hpf   WBC, UA 0-5 0 - 5 WBC/hpf   Bacteria, UA NONE SEEN NONE SEEN   Mucus PRESENT     Comment: Performed at [facility], [address] [place], [place] [address]  Lactic acid, plasma     Status: Abnormal   Collection Time: 02/17/22 10:48 PM  Result Value Ref Range   Lactic Acid, Venous 3.0 (HH) 0.5 - 1.9 mmol/L    Comment: CRITICAL RESULT CALLED TO, READ BACK BY AND VERIFIED WITH DECHAMBEAU A,RN 02/17/22 2351 WAYK Performed at [facility], [address], [place], [place] [address]   Comprehensive metabolic panel     Status: Abnormal   Collection Time: 02/17/22 10:48 PM  Result Value Ref Range   Sodium 131 (L) 135 - 145 mmol/L   Potassium 4.4 3.5 - 5.1 mmol/L   Chloride 95 (L) 98 - 111 mmol/L   CO2 25 22 - 32 mmol/L   Glucose, Bld 467 (H) 70 - 99 mg/dL  Comment: Glucose reference range applies only to samples taken after fasting for at least 8 hours.   BUN 14 6 - 20 mg/dL   Creatinine, Ser 1.19 0.61 - 1.24 mg/dL   Calcium 9.2 8.9 - 10.3 mg/dL   Total Protein 7.9 6.5 - 8.1 g/dL   Albumin 3.1 (L) 3.5 - 5.0 g/dL   AST 12 (L) 15 - 41 U/L   ALT 13 0 - 44 U/L   Alkaline Phosphatase 95 38 - 126 U/L   Total Bilirubin 1.4 (H) 0.3 - 1.2 mg/dL   GFR, Estimated >60 >60 mL/min    Comment: (NOTE) Calculated using the CKD-EPI Creatinine Equation (2021)    Anion gap 11 5 - 15    Comment: Performed at [facility], [place] [address] [place], [place] [address]  CBC with Differential     Status: Abnormal   Collection Time: 02/17/22 10:48 PM  Result Value Ref Range   WBC 12.5 (H) 4.0 - 10.5 K/uL   RBC 5.57 4.22 - 5.81 MIL/uL   Hemoglobin 14.4 13.0 - 17.0 g/dL   HCT 45.5 39.0 - 52.0 %   MCV 81.7 80.0 - 100.0 fL   MCH 25.9 (L) 26.0 - 34.0 pg   MCHC 31.6 30.0 -  36.0 g/dL   RDW 12.8 11.5 - 15.5 %   Platelets 281 150 - 400 K/uL   nRBC 0.0 0.0 - 0.2 %   Neutrophils Relative % 79 %   Neutro Abs 9.9 (H) 1.7 - 7.7 K/uL   Lymphocytes Relative 12 %   Lymphs Abs 1.5 0.7 - 4.0 K/uL   Monocytes Relative 7 %   Monocytes Absolute 0.9 0.1 - 1.0 K/uL   Eosinophils Relative 1 %   Eosinophils Absolute 0.1 0.0 - 0.5 K/uL   Basophils Relative 0 %   Basophils Absolute 0.0 0.0 - 0.1 K/uL   Immature Granulocytes 1 %   Abs Immature Granulocytes 0.07 0.00 - 0.07 K/uL    Comment: Performed at [facility] [address], [place], [place] [address]  Protime-INR     Status: None   Collection Time: 02/17/22 10:48 PM  Result Value Ref Range   Prothrombin Time 14.5 11.4 - 15.2 seconds   INR 1.1 0.8 - 1.2    Comment: (NOTE) INR goal varies based on device and disease states. Performed at [facility], [place] [address] [place], [place] [address]   APTT     Status: None   Collection Time: 02/17/22 10:48 PM  Result Value Ref Range   aPTT 28 24 - 36 seconds    Comment: Performed at [facility] [address] [place], [place] [address]  CBG monitoring, ED     Status: Abnormal   Collection Time: 02/17/22 10:50 PM  Result Value Ref Range   Glucose-Capillary 476 (H) 70 - 99 mg/dL    Comment: Glucose reference range applies only to samples taken after fasting for at least 8 hours.  Lactic acid, plasma     Status: Abnormal   Collection Time: 02/18/22  1:25 AM  Result Value Ref Range   Lactic Acid, Venous 2.0 (HH) 0.5 - 1.9 mmol/L    Comment: CRITICAL RESULT CALLED TO, READ BACK BY AND VERIFIED WITH DECHAMBEAU A,RN 02/18/22 0211 WAYK Performed at [facility], [place] [address], [place], [place] [address]     CT PELVIS W CONTRAST  Result Date: 02/18/2022 CLINICAL DATA:  Peroneal abscess EXAM: CT PELVIS  WITH CONTRAST TECHNIQUE: Multidetector CT imaging of the pelvis was performed using the standard protocol following the bolus administration of  intravenous contrast. RADIATION DOSE REDUCTION: This exam was performed according to the departmental dose-optimization program which includes automated exposure control, adjustment of the mA and/or kV according to patient size and/or use of iterative reconstruction technique. CONTRAST:  OMNIPAQUE IOHEXOL 300 MG/ML  SOLN COMPARISON:  None Available. FINDINGS: Urinary Tract: Bladder is well distended. Kidneys are not well visualized on this exam. Bowel: Visualized bowel shows no acute abnormality. The appendix is within normal limits. Vascular/Lymphatic: Mild atherosclerotic calcifications are seen. Reactive left inguinal lymph nodes are seen Reproductive:  Prostate is within normal limits. Other:  No free fluid is seen.  No focal herniation is noted. Musculoskeletal: In the perineum eccentric to the left, there is a focal air-fluid collection identified which measures 6.7 cm in greatest AP dimension and 2.5 cm in transverse dimension. Craniocaudad extension is approximately 4 cm. These changes are consistent with a subcutaneous abscess. Some surrounding edema is noted. No involvement of the scrotum is seen. The edema extends into the medial aspect of the left calf. No bony abnormality is noted. IMPRESSION: Changes consistent with subcutaneous abscess in the perineum on the left with some surrounding edema. No scrotal involvement is seen at this time. No extension into the perirectal or perianal region is seen. Electronically Signed   By: Alcide Clever M.D.   On: 02/18/2022 01:15   DG Chest Port 1 View  Result Date: 02/18/2022 CLINICAL DATA:  Questionable sepsis. EXAM: PORTABLE CHEST 1 VIEW COMPARISON:  Chest radiograph dated 02/02/2021. FINDINGS: Mild eventration of the right hemidiaphragm. No focal consolidation, pleural effusion or pneumothorax. Mild cardiomegaly. No acute osseous pathology. IMPRESSION: No active cardiopulmonary disease.  Five Electronically Signed   By: Elgie Collard M.D.   On:  02/18/2022 00:01    Review of Systems  All other systems reviewed and are negative.  Blood pressure 123/82, pulse 90, temperature (!) 100.4 F (38 C), temperature source Oral, resp. rate 18, SpO2 100 %. Physical Exam Vitals reviewed.  Constitutional:      General: He is in acute distress (looks uncomfortable, sitting with legs apart).     Appearance: Normal appearance. He is obese.  HENT:     Head: Normocephalic and atraumatic.     Right Ear: External ear normal.     Left Ear: External ear normal.     Nose: Nose normal. No congestion or rhinorrhea.     Mouth/Throat:     Mouth: Mucous membranes are moist.     Pharynx: No oropharyngeal exudate or posterior oropharyngeal erythema.  Eyes:     General: No scleral icterus.       Right eye: No discharge.        Left eye: No discharge.     Extraocular Movements: Extraocular movements intact.     Conjunctiva/sclera: Conjunctivae normal.     Pupils: Pupils are equal, round, and reactive to light.  Neck:     Comments: Keloids present on upper medial neck/jaw region bilaterally Cardiovascular:     Rate and Rhythm: Normal rate and regular rhythm.     Pulses: Normal pulses.     Heart sounds: Normal heart sounds. No murmur heard.    No friction rub. No gallop.  Pulmonary:     Effort: Pulmonary effort is normal. No respiratory distress.     Breath sounds: Normal breath sounds. No stridor. No wheezing or rhonchi.  Abdominal:  General: Abdomen is flat. Bowel sounds are normal. There is no distension.     Palpations: Abdomen is soft. There is no mass.     Tenderness: There is no abdominal tenderness. There is no guarding or rebound.  Genitourinary:     Musculoskeletal:        General: No swelling, tenderness, deformity or signs of injury.     Cervical back: Normal range of motion and neck supple. No rigidity or tenderness.     Right lower leg: No edema.     Left lower leg: No edema.  Lymphadenopathy:     Cervical: No cervical  adenopathy.  Skin:    General: Skin is warm and dry.     Capillary Refill: Capillary refill takes 2 to 3 seconds.     Coloration: Skin is not jaundiced or pale.     Findings: No bruising or erythema.  Neurological:     General: No focal deficit present.     Mental Status: He is alert and oriented to person, place, and time.     Cranial Nerves: No cranial nerve deficit.     Sensory: No sensory deficit.     Motor: No weakness.     Coordination: Coordination normal.  Psychiatric:        Mood and Affect: Mood normal.        Behavior: Behavior normal.        Thought Content: Thought content normal.        Judgment: Judgment normal.     Assessment/Plan: Left groin/perirectal abscess DM  These do not necessarily appear to be connected, but are quite tender. The one that is lower on the upper thigh/perirectal region is still tense despite having some drainage. Given the extreme tenderness, will plan operative I&D NPO until post op. Antibiotics  D/w daytime surgical team.   OR findings will determine post op dressings and care requirements.   Discussed surgery with the patient and reviewed that there may be dressing changes required.  Advised that there will likely be more than one wound and that they will be left open.    Stark Klein 02/18/2022, 7:15 AM

## 2022-02-18 NOTE — Anesthesia Postprocedure Evaluation (Signed)
Anesthesia Post Note  Patient: Rodney Kane  Procedure(s) Performed: IRRIGATION AND DEBRIDEMENT PERIRECTAL ABSCESS (Rectum) IRRIGATION AND DEBRIDEMENT GROIN ABSCESS (Groin)     Patient location during evaluation: PACU Anesthesia Type: General Level of consciousness: awake and alert and oriented Pain management: pain level controlled Vital Signs Assessment: post-procedure vital signs reviewed and stable Respiratory status: spontaneous breathing, nonlabored ventilation and respiratory function stable Cardiovascular status: blood pressure returned to baseline and stable Postop Assessment: no apparent nausea or vomiting Anesthetic complications: no   No notable events documented.  Last Vitals:  Vitals:   02/18/22 1200 02/18/22 1215  BP: 134/81 126/77  Pulse: 90 88  Resp: (!) 21 14  Temp:    SpO2: 97% 95%    Last Pain:  Vitals:   02/18/22 1215  TempSrc:   PainSc: 0-No pain                 Jerrell Mangel A.

## 2022-02-18 NOTE — ED Notes (Signed)
Vancomycin has not arrived yet, this RN notified pharmacy.

## 2022-02-18 NOTE — ED Notes (Signed)
Patient transported to CT 

## 2022-02-18 NOTE — Inpatient Diabetes Management (Signed)
Inpatient Diabetes Program Recommendations  AACE/ADA: New Consensus Statement on Inpatient Glycemic Control (2015)  Target Ranges:  Prepandial:   less than 140 mg/dL      Peak postprandial:   less than 180 mg/dL (1-2 hours)      Critically ill patients:  140 - 180 mg/dL   Lab Results  Component Value Date   GLUCAP 476 (H) 02/17/2022   HGBA1C 12.8 (A) 12/05/2021   HGBA1C 12.8 12/05/2021   HGBA1C 12.8 (A) 12/05/2021   HGBA1C 12.8 (A) 12/05/2021    Review of Glycemic Control  Diabetes history: DM2 Outpatient Diabetes medications: Glucotrol 10 mg bid Current orders for Inpatient glycemic control: Levemir 20 units bid, Novolog 0-20 units correction q 4 hrs.  Inpatient Diabetes Program Recommendations:   Spoke with patient via phone in ED to clarify diabetes medications he is currently taking. Patient states he is only taking Glucotrol due to the cost of Januvia was too expensive and patient has not been checking his CBGs. Reviewed with patient last A1c from May 2023. Reviewed risks of elevated CBGs and need to check CBGs and take to MD appointments for best diabetes management. Will follow during hospitaliation.  Thank you, Rodney Kane. Bradie Sangiovanni, RN, MSN, CDE  Diabetes Coordinator Inpatient Glycemic Control Team Team Pager [phone] (8am-5pm) 02/18/2022 8:07 AM

## 2022-02-18 NOTE — Anesthesia Procedure Notes (Signed)
Procedure Name: Intubation Date/Time: 02/18/2022 11:05 AM  Performed by: Inda Coke, CRNAPre-anesthesia Checklist: Patient identified, Emergency Drugs available, Suction available, Timeout performed and Patient being monitored Patient Re-evaluated:Patient Re-evaluated prior to induction Oxygen Delivery Method: Circle system utilized Preoxygenation: Pre-oxygenation with 100% oxygen Induction Type: IV induction Ventilation: Mask ventilation without difficulty and Oral airway inserted - appropriate to patient size Laryngoscope Size: Mac and 4 Grade View: Grade I Tube type: Oral Tube size: 7.5 mm Airway Equipment and Method: Stylet Placement Confirmation: ETT inserted through vocal cords under direct vision, positive ETCO2, CO2 detector and breath sounds checked- equal and bilateral Secured at: 23 cm Tube secured with: Tape Dental Injury: Teeth and Oropharynx as per pre-operative assessment

## 2022-02-18 NOTE — Transfer of Care (Signed)
Immediate Anesthesia Transfer of Care Note  Patient: Rodney Kane  Procedure(s) Performed: IRRIGATION AND DEBRIDEMENT PERIRECTAL ABSCESS (Rectum) IRRIGATION AND DEBRIDEMENT GROIN ABSCESS (Groin)  Patient Location: PACU  Anesthesia Type:General  Level of Consciousness: awake, alert  and oriented  Airway & Oxygen Therapy: Patient Spontanous Breathing  Post-op Assessment: Report given to RN and Post -op Vital signs reviewed and stable  Post vital signs: Reviewed and stable  Last Vitals:  Vitals Value Taken Time  BP 136/82 02/18/22 1145  Temp    Pulse 94 02/18/22 1146  Resp 12 02/18/22 1146  SpO2 99 % 02/18/22 1146  Vitals shown include unvalidated device data.  Last Pain:  Vitals:   02/18/22 1019  TempSrc: Oral  PainSc:          Complications: No notable events documented.

## 2022-02-18 NOTE — Op Note (Signed)
  02/18/2022  11:55 AM  PATIENT:  Rodney Kane  46 y.o. male  PRE-OPERATIVE DIAGNOSIS:  perirectal abscess  POST-OPERATIVE DIAGNOSIS:  perirectal abscess  PROCEDURE:  Procedure(s): Incision and drainage perirectal abscess Debridement of perirectal abscess 2cm x 3cm x 2 cm  SURGEON:  Surgeon(s): Violeta Gelinas, MD  ASSISTANTS: Cristi Loron, MD   ANESTHESIA:   general  EBL:  Total I/O In: 1663.5 [I.V.:1663.5] Out: 25 [Blood:25]  BLOOD ADMINISTERED:none  DRAINS: none   SPECIMEN:  Excision  DISPOSITION OF SPECIMEN:  PATHOLOGY  COUNTS:  YES  DICTATION: Reubin Milan Dictation Excisional debridement:  1.  Informed consent was obtained.  He was taken to the operating room and general endotracheal anesthesia was administered by the anesthesia staff.  He is on IV antibiotics.  He was placed in lithotomy position with appropriate padding.  His perianal region was prepped and draped in a sterile fashion.  Timeout procedure was performed.  First, I performed a rectal exam which did not reveal any significant abnormality.  There was an area to the left and anterior of the rectum with chronically inflamed and partially devitalized tissue.  This was excised using cautery.  We then probed the wound and entered a large abscess cavity.  All loculations were broken up.  The area was thoroughly irrigated.  Cultures were sent.  Hemostasis was then obtained with cautery and we packed the wound with 1 inch iodoform gauze.  Sterile dressings were applied.  All counts were correct.  He tolerated the procedure without apparent complication and was taken recovery in stable condition.  2.  Tool used for debridement (curette, scapel, etc.)  cautery  3.  Frequency of surgical debridement.   First time  4.  Measurement of total devitalized tissue (wound surface) before and after surgical debridement.   2cmx3cmx2cm deep  5.  Area and depth of devitalized tissue removed from wound.  2cmx3cmx2cm  deep  6.  Blood loss and description of tissue removed.  Purulent hyperemic tissue  7.  Evidence of the progress of the wound's response to treatment.  A.  Current wound volume (current dimensions and depth).  2cmx3cmx2cm deep  B.  Presence (and extent of) of infection.  present  C.  Presence (and extent of) of non viable tissue.  Minimal  D.  Other material in the wound that is expected to inhibit healing.  no  8.  Was there any viable tissue removed (measurements): edges  PATIENT DISPOSITION:  PACU - hemodynamically stable.   Delay start of Pharmacological VTE agent (>24hrs) due to surgical blood loss or risk of bleeding:  no  Violeta Gelinas, MD, MPH, FACS Pager: [phone]  8/1/202311:55 AM

## 2022-02-18 NOTE — H&P (Signed)
History and Physical    Patient: Rodney Kane RSW:[id number] DOB: 03-Jul-1976 DOA: 02/17/2022 DOS: the patient was seen and examined on 02/18/2022 PCP: Early Osmond, NP  Patient coming from: Home  Chief Complaint:  Chief Complaint  Patient presents with   Abscess   HPI: Rodney Kane is a 46 y.o. male with medical history significant of prior COVID infection poorly controlled diabetes mellitus complicated by patient's noncompliance, history of CVA with residual expressive aphasia, dyslipidemia, hypertension, dilated cardiomyopathy with HFrEF who presented to the ER with complaints of groin/perirectal/thigh pain.  Patient having difficulty explaining history due to his expressive aphasia but was able to relate that about Wednesday or Thursday of last week he had a knot on his left leg buttock region and eventually this area began to increase in size pain and redness and did drain some purulent material spontaneously.  Patient did not seek medical care and was using alcohol topically to treat this area.  Upon evaluation in the ER patient did have exquisitely tender indurated area lateral to the rectum about 4 cm at the 4:00 level mainly on the buttock this was also causing adjacent redness in the buttock and the thigh area.  There appeared to be a fibrinous eschar on top of this knot.  The hypertensive with low-grade temperature of 100.4.  He was not hypoxic.  Did have a leukocytosis of 12.5 with mild lactic acidosis 3.0 that has subsequently improved to 2.0.  He was also noted to be hyperglycemic with a normal anion gap but associated pseudohyponatremia with a sodium of 131.  CT pelvis revealed acute subcutaneous abscess in the perineum without scrotal involvement or extension into the perirectal or perianal region.  Urinalysis consistent with some dehydration with elevated specific gravity and evidence of glycosuria.  Cultures have been obtained.  Surgical team has been consulted and the  hospitalist team has been consulted for admission.  Review of Systems: As mentioned in the history of present illness. All other systems reviewed and are negative. 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   Stroke [facility])    Urine ketones 12/2019   Vitamin D deficiency 12/2019   Past Surgical History:  Procedure Laterality Date   left knee surgery     RIGHT/LEFT HEART CATH AND CORONARY ANGIOGRAPHY N/A 01/05/2017   Procedure: Right/Left Heart Cath and Coronary Angiography;  Surgeon: [name], MD;  Location: [facility] INVASIVE CV LAB;  Service: Cardiovascular; angiograph normal coronaries.  PAP mean 19 million mercury, PCWP 15 mmHg.  LVEDP 9 mmHg.  Cardiac output/index 7.84/2.7.  RAP mean 4 mmHg.   TRANSTHORACIC ECHOCARDIOGRAM  01/04/2017   EF 30 to 35%.  Moderate LVH.  Moderate severely reduced EF.  Diffuse HK.  Normal valves.   TRANSTHORACIC ECHOCARDIOGRAM  07/20/2020    Severely reduced LV function-EF 20 to 25%, global HK.  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  Mildly reduced cardiac function.  Unable to assess PA pressures.  Moderate LA dilation.  Small pericardial effusion.  Relatively normal mitral valve and tricuspid valve.  Normal RA pressures.  Social History:  reports that he has never smoked. He has never used smokeless tobacco. He reports that he does not drink alcohol and does not use drugs.  No Known Allergies  Family History  Problem Relation Age of Onset   Multiple sclerosis Mother    Diabetic kidney disease Mother     Hypertension Mother     Prior to Admission medications   Medication Sig Start Date End Date Taking? Authorizing Provider  atorvastatin (LIPITOR) 40 MG tablet Take 1 tablet (40 mg total) by mouth daily. 12/05/21   Ivonne Andrew, NP  glipiZIDE (GLUCOTROL) 10 MG tablet Take 1 tablet (10 mg total) by mouth 2 (two) times daily before a meal. 12/05/21   Ivonne Andrew, NP  hydrocortisone cream 1 % Apply 1 application topically 2 (two) times daily. Patient not taking: Reported on 12/16/2021 05/10/21   Barbette Merino, NP  hydrOXYzine (ATARAX/VISTARIL) 10 MG tablet Take 1 tablet (10 mg total) by mouth 3 (three) times daily as needed. Patient taking differently: Take 10 mg by mouth 3 (three) times daily as needed for anxiety. 09/19/20   Kallie Locks, FNP  losartan (COZAAR) 100 MG tablet Take 1 tablet (100 mg total) by mouth daily. 12/05/21   Ivonne Andrew, NP  metoprolol succinate (TOPROL-XL) 50 MG 24 hr tablet Take 1 tablet (50 mg total) by mouth daily. Take with or immediately following a meal. 12/05/21 03/05/22  Ivonne Andrew, NP  polyethylene glycol powder (GLYCOLAX/MIRALAX) 17 GM/SCOOP powder Take 17 g by mouth daily. Patient not taking: Reported on 12/16/2021 08/29/20   Kallie Locks, FNP  potassium chloride SA (KLOR-CON M) 20 MEQ tablet Take 1 tablet (20 mEq total) by mouth daily. 12/18/21   [name], MD  rivaroxaban (XARELTO) 20 MG TABS tablet TAKE 1 TABLET (20 MG TOTAL) BY MOUTH DAILY WITH SUPPER. Patient not taking: Reported on 12/16/2021 09/26/20 09/26/21  Inez Catalina, MD  sitaGLIPtin (JANUVIA) 25 MG tablet Take 1 tablet (25 mg total) by mouth daily. Patient not taking: Reported on 12/16/2021 12/05/21   Ivonne Andrew, NP  spironolactone (ALDACTONE) 25 MG tablet Take 1 tablet (25 mg total) by mouth daily. 12/05/21 12/05/22  Ivonne Andrew, NP  torsemide (DEMADEX) 20 MG tablet Take 2 tablets (40 mg total) by mouth daily. 12/05/21   Ivonne Andrew, NP    Physical  Exam: Vitals:   02/18/22 0615 02/18/22 0630 02/18/22 0645 02/18/22 0721  BP: 131/77 131/82 123/82   Pulse: 83 98 90   Resp: 15  18   Temp:    99.1 F (37.3 C)  TempSrc:    Oral  SpO2: 99% 100% 100%    Constitutional: NAD, calm, uncomfortable 2/2 perineal pain ENMT: Mucous membranes are dry. Posterior pharynx clear of any exudate or lesions.Normal dentition.  Neck: normal, supple, no masses, no thyromegaly Respiratory: clear to auscultation bilaterally, no wheezing, no crackles. Normal respiratory effort. No accessory muscle use.  Cardiovascular: Regular rate and rhythm, no murmurs / rubs / gallops.  Some bilateral lower extremity edema although most of the edema is focal in the left thigh in conjunction with current infectious process.. 2+ pedal pulses.  Abdomen: no tenderness, no masses palpated. No hepatosplenomegaly. Bowel sounds positive.  Musculoskeletal: no clubbing / cyanosis. No joint deformity upper and lower extremities. Good ROM, no contractures. Normal muscle tone.  Skin: Patient has an indurated raised area in the perineum about 4:00 to the rectum and primarily located on the  buttock region this area is very tender but the fibrinous And is about 2 x 2 cm diameter with some spontaneous purulent drainage. Neurologic: CN 2-12 grossly intact.  To have significant expressive aphasia which is sequelae of prior stroke.  Sensation intact,Strength 5/5 x all 4 extremities.  Psychiatric: Appears to have normal judgment and insight. Alert and oriented x 3. Normal mood.   Data Reviewed:  Laboratory data: Sodium 131, potassium 4.4, chloride 95, CO2 25, glucose 467, BUN 14, creatinine 1.19, albumin 3.1, AST 12, total bilirubin 1.4, lactic acid 3.0 now down to 2.0, white count 12,500 with neutrophils 79%, hemoglobin 14.4, platelets 281,000, coags are normal, respiratory panel normal, urinalysis unremarkable except for abnormal color, greater than 500 of glucose and 20 of ketones with a specific  gravity of 1.028.  Blood cultures are pending.  Chest x-ray: No active cardiopulmonary disease.  CT pelvis:Changes consistent with subcutaneous abscess in the perineum on the left with some surrounding edema. No scrotal involvement is seen at this time. No extension into the perirectal or perianal region is seen.  Assessment and Plan: Perineal abscess Surgical team plans operative intervention today on 8/1 Continue broad-spectrum IV antibiotics (Maxipime and vancomycin)-narrow as appropriate.  Anticipate deep wound cultures will be obtained in the OR which will guide therapeutic choice Wound care management per surgical team Use combination of narcotic and nonnarcotic analgesia Suspect poorly controlled diabetes contribute     Elevated lactic acid Likely from dehydration and hyperglycemia Hydrate patient cautiously given history of reduced ejection fraction Control diabetes to minimize volume loss Lactic acid has been trending downward since arrival and reduction in significant hyperglycemia  Poorly controlled diabetes mellitus with associated pseudohyponatremia Presented with CBG 467 and sodium 131 Hemoglobin A1c in May was 12.8-globin A1c this admission Gust with patient and he seems to lack appropriate understanding of diabetes process and management.  He told me at times when he is feeling better and feels like his sugars controlled he just does not pick up his medications. Patient counseled on importance of managing diabetes to prevent sequela such as wounds, stroke, heart attack or kidney failure that would require dialysis Patient does have a glucometer at home but apparently had some difficulty obtaining supplies and does not check CBGs regularly Would benefit from follow-up with outpatient diabetes clinic if eligible Patient states that he pays for medications out-of-pocket and this could be a barrier especially if insulin is initiated this admission Inpatient diabetes  educator consultation [facility] consultation for discharge needs Would benefit from SGLT2 given his heart failure but money appears to be a barrier Patient ran out of Glucotrol prior to admission  Hypertension BP better controlled now that pain is controlled Continue metoprolol and Cozaar but will need to adjust dosages since current SBP in the 120s  History of dilated cardiomyopathy/HFrEF Last echocardiogram was November 2022.  At that time he was noted to have an ejection fraction of 30 to 35% with global hypokinesis and normal RV systolic function.  Prior to that echo patient had apparently had a left apical thrombus which was no longer visualized Given his longstanding history of medication noncompliance as well as some edema in lower extremities we will repeat echo this admission.  If not already enrolled in the heart failure clinic as an outpatient this may also benefit the patient to prevent hospital readmission for this problem Currently appears compensated in regards to symptoms although does have some mild peripheral edema Patient was supposed to be taking metoprolol, Cozaar and  Aldactone with Demadex prior to admission but ran out of medications-current SBP in the 120s so can resume these medications at a lower dose and monitor for hypotension. Gradually add back in the diuretics given current dehydration with auto diuresis from hyperglycemia  Dyslipidemia Continue Lipitor  CVA Noted to have significant expressive aphasia from prior CVA May need PT evaluation as well to ensure no gait disturbance   Advance Care Planning: Full code  DVT prophylaxis: SCDs preoperatively.  Defer initiation of pharmacotherapeutic prophylaxis to surgical team in the postoperative setting  Consults: Dr. Grandville Silos with general surgery  Family Communication: Patient only  Severity of Illness: The appropriate patient status for this patient is INPATIENT. Inpatient status is judged to be reasonable and  necessary in order to provide the required intensity of service to ensure the patient's safety. The patient's presenting symptoms, physical exam findings, and initial radiographic and laboratory data in the context of their chronic comorbidities is felt to place them at high risk for further clinical deterioration. Furthermore, it is not anticipated that the patient will be medically stable for discharge from the hospital within 2 midnights of admission.   * I certify that at the point of admission it is my clinical judgment that the patient will require inpatient hospital care spanning beyond 2 midnights from the point of admission due to high intensity of service, high risk for further deterioration and high frequency of surveillance required.*  Author: Erin Hearing, NP 02/18/2022 7:26 AM  For on call review [email or web address].

## 2022-02-18 NOTE — Anesthesia Preprocedure Evaluation (Signed)
Anesthesia Evaluation  Patient identified by MRN, date of birth, ID band Patient awake    Reviewed: Allergy & Precautions, NPO status , Patient's Chart, lab work & pertinent test results, reviewed documented beta blocker date and time   Airway Mallampati: II  TM Distance: >3 FB Neck ROM: Full    Dental no notable dental hx. (+) Dental Advisory Given, Teeth Intact   Pulmonary neg pulmonary ROS,    breath sounds clear to auscultation + decreased breath sounds      Cardiovascular hypertension, Pt. on medications and Pt. on home beta blockers +CHF  Normal cardiovascular exam Rhythm:Regular Rate:Normal  Echo 06/10/21 1. Left ventricular ejection fraction, by estimation, is 30 to 35%. The left ventricle has moderately decreased function. The left ventricle demonstrates global hypokinesis. The left ventricular internal cavity size was moderately dilated. There is mild left ventricular hypertrophy. Left ventricular diastolic parameters are indeterminate.  2. Right ventricular systolic function is normal. The right ventricular size is mildly enlarged. Tricuspid regurgitation signal is inadequate for assessing PA pressure.  3. Left atrial size was moderately dilated.  4. The mitral valve is normal in structure. Trivial mitral valve regurgitation.  5. The aortic valve is tricuspid. Aortic valve regurgitation is not visualized. No aortic stenosis is present.  6. Aortic dilatation noted. There is dilatation of the ascending aorta, measuring 41 mm.  7. Swirling artifact on contrast images at apex suggesting slow flow but no clear thrombus seen   Comparison(s): EF <20 %, large apical mural thrombus in the LV.   Cardiac Cath 01/05/17 ? Angiographically normal coronary arteries ? LV end diastolic pressure is normal - indicating adequate diuresis ? Hemodynamic findings not consistent with pulmonary hypertension - with normal PCL bpm PA  pressures.   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 Given his presentation with uncontrolled hypertension, most consistent with hypertensive cardiomyopathy. He has been adequately diuresed however. Blood pressures are still borderline, however he is lying flat.  EKG 02/18/22 ST, otherwise normal   Neuro/Psych Forgetful and dysarthria, expressive aphasia CVA, Residual Symptoms negative psych ROS   GI/Hepatic Neg liver ROS, Perirectal abscess   Endo/Other  diabetes, Poorly Controlled, Type 2, Oral Hypoglycemic AgentsMorbid obesityHyperlipidemia   Renal/GU Renal InsufficiencyRenal disease  negative genitourinary   Musculoskeletal negative musculoskeletal ROS (+)   Abdominal (+) + obese,   Peds  Hematology negative hematology ROS (+)   Anesthesia Other Findings   Reproductive/Obstetrics                             Anesthesia Physical Anesthesia Plan  ASA: 3  Anesthesia Plan: General   Post-op Pain Management: Minimal or no pain anticipated   Induction: Intravenous  PONV Risk Score and Plan: 3 and Treatment may vary due to age or medical condition, Midazolam and Ondansetron  Airway Management Planned: Oral ETT  Additional Equipment: None  Intra-op Plan:   Post-operative Plan: Extubation in OR  Informed Consent: I have reviewed the patients History and Physical, chart, labs and discussed the procedure including the risks, benefits and alternatives for the proposed anesthesia with the patient or authorized representative who has indicated his/her understanding and acceptance.     Dental advisory given  Plan Discussed with: CRNA and Anesthesiologist  Anesthesia Plan Comments:         Anesthesia Quick Evaluation

## 2022-02-18 NOTE — Progress Notes (Signed)
Patient ID: Rodney Kane, male   DOB: 30-Nov-1975, 46 y.o.   MRN: [id number] Patient examined and I D/W the [facility] team as well. I agree with Dr. Donell Beers. Plan I&D perirectal and groin abscess. Procedure, risks, and benefits discussed and he agrees.  Violeta Gelinas, MD, MPH, FACS Please use [email or web address] to contact on call provider

## 2022-02-19 ENCOUNTER — Other Ambulatory Visit (HOSPITAL_COMMUNITY): Payer: Self-pay

## 2022-02-19 ENCOUNTER — Encounter (HOSPITAL_COMMUNITY): Payer: Self-pay | Admitting: General Surgery

## 2022-02-19 ENCOUNTER — Inpatient Hospital Stay (HOSPITAL_COMMUNITY): Payer: BC Managed Care – PPO

## 2022-02-19 ENCOUNTER — Telehealth (HOSPITAL_COMMUNITY): Payer: Self-pay | Admitting: Pharmacy Technician

## 2022-02-19 DIAGNOSIS — L03314 Cellulitis of groin: Secondary | ICD-10-CM

## 2022-02-19 DIAGNOSIS — I5022 Chronic systolic (congestive) heart failure: Secondary | ICD-10-CM

## 2022-02-19 LAB — CBC
HCT: 37.7 % — ABNORMAL LOW (ref 39.0–52.0)
Hemoglobin: 12.1 g/dL — ABNORMAL LOW (ref 13.0–17.0)
MCH: 26.4 pg (ref 26.0–34.0)
MCHC: 32.1 g/dL (ref 30.0–36.0)
MCV: 82.3 fL (ref 80.0–100.0)
Platelets: 250 10*3/uL (ref 150–400)
RBC: 4.58 MIL/uL (ref 4.22–5.81)
RDW: 13.1 % (ref 11.5–15.5)
WBC: 9.3 10*3/uL (ref 4.0–10.5)
nRBC: 0 % (ref 0.0–0.2)

## 2022-02-19 LAB — COMPREHENSIVE METABOLIC PANEL
ALT: 12 U/L (ref 0–44)
AST: 12 U/L — ABNORMAL LOW (ref 15–41)
Albumin: 2.4 g/dL — ABNORMAL LOW (ref 3.5–5.0)
Alkaline Phosphatase: 61 U/L (ref 38–126)
Anion gap: 6 (ref 5–15)
BUN: 8 mg/dL (ref 6–20)
CO2: 28 mmol/L (ref 22–32)
Calcium: 8.4 mg/dL — ABNORMAL LOW (ref 8.9–10.3)
Chloride: 103 mmol/L (ref 98–111)
Creatinine, Ser: 0.95 mg/dL (ref 0.61–1.24)
GFR, Estimated: >60 mL/min (ref >60)
Glucose, Bld: 186 mg/dL — ABNORMAL HIGH (ref 70–99)
Potassium: 4.1 mmol/L (ref 3.5–5.1)
Sodium: 137 mmol/L (ref 135–145)
Total Bilirubin: 0.5 mg/dL (ref 0.3–1.2)
Total Protein: 6.2 g/dL — ABNORMAL LOW (ref 6.5–8.1)

## 2022-02-19 LAB — GLUCOSE, CAPILLARY
Glucose-Capillary: 144 mg/dL — ABNORMAL HIGH (ref 70–99)
Glucose-Capillary: 156 mg/dL — ABNORMAL HIGH (ref 70–99)
Glucose-Capillary: 184 mg/dL — ABNORMAL HIGH (ref 70–99)
Glucose-Capillary: 192 mg/dL — ABNORMAL HIGH (ref 70–99)

## 2022-02-19 LAB — ECHOCARDIOGRAM COMPLETE
Area-P 1/2: 3.77 cm2
Calc EF: 48.8 %
S' Lateral: 3.4 cm
Single Plane A2C EF: 46.1 %
Single Plane A4C EF: 48.3 %

## 2022-02-19 LAB — HEMOGLOBIN A1C
Hgb A1c MFr Bld: >15.5 % — ABNORMAL HIGH (ref 4.8–5.6)
Mean Plasma Glucose: >398 mg/dL

## 2022-02-19 LAB — SURGICAL PATHOLOGY

## 2022-02-19 MED ORDER — VANCOMYCIN HCL 1250 MG/250ML IV SOLN
1250.0000 mg | Freq: Two times a day (BID) | INTRAVENOUS | Status: DC
  Administered 2022-02-19: 1250 mg via INTRAVENOUS
  Filled 2022-02-19: qty 250

## 2022-02-19 MED ORDER — BLOOD GLUCOSE METER KIT: PACK | 0 refills | Status: DC

## 2022-02-19 MED ORDER — NOVOLIN 70/30 RELION (70-30) 100 UNIT/ML ~~LOC~~ SUSP
10.0000 [IU] | Freq: Two times a day (BID) | SUBCUTANEOUS | 2 refills | Status: DC
Start: 2022-02-19 — End: 2023-08-11

## 2022-02-19 MED ORDER — OXYCODONE HCL 5 MG PO TABS
5.0000 mg | ORAL_TABLET | Freq: Four times a day (QID) | ORAL | 0 refills | Status: DC | PRN
  Filled 2022-02-19: qty 15, 4d supply, fill #0

## 2022-02-19 MED ORDER — SPIRONOLACTONE 25 MG PO TABS: 25.0000 mg | ORAL_TABLET | Freq: Every day | ORAL | 2 refills | Status: DC

## 2022-02-19 MED ORDER — VANCOMYCIN HCL 1500 MG/300ML IV SOLN
1500.0000 mg | Freq: Two times a day (BID) | INTRAVENOUS | Status: DC
  Filled 2022-02-19: qty 300

## 2022-02-19 MED ORDER — INSULIN SYRINGES (DISPOSABLE) U-100 0.3 ML MISC: 2 refills | Status: DC

## 2022-02-19 MED ORDER — METOPROLOL SUCCINATE ER 50 MG PO TB24: 50.0000 mg | ORAL_TABLET | Freq: Every day | ORAL | 2 refills | Status: DC

## 2022-02-19 MED ORDER — AMOXICILLIN-POT CLAVULANATE 875-125 MG PO TABS: 1.0000 | ORAL_TABLET | Freq: Two times a day (BID) | ORAL | 0 refills | Status: AC

## 2022-02-19 MED ORDER — LOSARTAN POTASSIUM 25 MG PO TABS: 25.0000 mg | ORAL_TABLET | Freq: Every day | ORAL | 2 refills | Status: DC

## 2022-02-19 MED ORDER — ATORVASTATIN CALCIUM 40 MG PO TABS: 40.0000 mg | ORAL_TABLET | Freq: Every day | ORAL | 2 refills | Status: DC

## 2022-02-19 MED ORDER — GLIPIZIDE 5 MG PO TABS: 5.0000 mg | ORAL_TABLET | Freq: Two times a day (BID) | ORAL | 2 refills | Status: DC

## 2022-02-19 MED ORDER — TORSEMIDE 20 MG PO TABS: 20.0000 mg | ORAL_TABLET | Freq: Every day | ORAL | 2 refills | Status: DC

## 2022-02-19 NOTE — Progress Notes (Addendum)
1 Day Post-Op  Subjective: CC: L groin and perirectal pain improved and well controlled.  Tolerating diet without n/v or abdominal pain. Voiding. Reports he has gotten oob.   Objective: Vital signs in last 24 hours: Temp:  [97.3 F (36.3 C)-98.5 F (36.9 C)] 98.4 F (36.9 C) (08/02 0733) Pulse Rate:  [74-92] 76 (08/02 0733) Resp:  [9-21] 17 (08/02 0733) BP: (101-140)/(53-93) 135/74 (08/02 0733) SpO2:  [95 %-100 %] 100 % (08/02 0733) Last BM Date : 02/17/22  Intake/Output from previous day: 08/01 0701 - 08/02 0700 In: 2143.5 [P.O.:480; I.V.:1663.5] Out: 1575 [Urine:1550; Blood:25] Intake/Output this shift: No intake/output data recorded.  PE: Gen:  Alert, NAD, pleasant Pulm: Rate and effort normal Abd: Soft, ND, NT  GU: Chaperone present, RN. I&D site packing removed with bloody draining on packing. Wound measures 2cm x 3cm x 2 cm. No drainage. Beefy red tissue at the base of the wound. Some induration that extends to left groin crease that does not seem to extend to the scrotum. There are no other areas of fluctuance. No overlying erythema or heat.   Lab Results:  Recent Labs    02/17/22 2248 02/19/22 0414  WBC 12.5* 9.3  HGB 14.4 12.1*  HCT 45.5 37.7*  PLT 281 250   BMET Recent Labs    02/17/22 2248 02/19/22 0414  NA 131* 137  K 4.4 4.1  CL 95* 103  CO2 25 28  GLUCOSE 467* 186*  BUN 14 8  CREATININE 1.19 0.95  CALCIUM 9.2 8.4*   PT/INR Recent Labs    02/17/22 2248  LABPROT 14.5  INR 1.1   CMP     Component Value Date/Time   NA 137 02/19/2022 0414   NA 137 12/05/2021 1204   K 4.1 02/19/2022 0414   CL 103 02/19/2022 0414   CO2 28 02/19/2022 0414   GLUCOSE 186 (H) 02/19/2022 0414   BUN 8 02/19/2022 0414   BUN 10 12/05/2021 1204   CREATININE 0.95 02/19/2022 0414   CALCIUM 8.4 (L) 02/19/2022 0414   PROT 6.2 (L) 02/19/2022 0414   PROT 7.1 12/05/2021 1204   ALBUMIN 2.4 (L) 02/19/2022 0414   ALBUMIN 4.1 12/05/2021 1204   AST 12 (L)  02/19/2022 0414   ALT 12 02/19/2022 0414   ALKPHOS 61 02/19/2022 0414   BILITOT 0.5 02/19/2022 0414   BILITOT 0.4 12/05/2021 1204   GFRNONAA >60 02/19/2022 0414   GFRAA 93 09/14/2020 1222   Lipase     Component Value Date/Time   LIPASE 16 07/19/2020 1853    Studies/Results: CT PELVIS W CONTRAST  Result Date: 02/18/2022 CLINICAL DATA:  Peroneal abscess EXAM: CT PELVIS WITH CONTRAST TECHNIQUE: Multidetector CT imaging of the pelvis was performed using the standard protocol following the bolus administration of intravenous contrast. RADIATION DOSE REDUCTION: This exam was performed according to the departmental dose-optimization program which includes automated exposure control, adjustment of the mA and/or kV according to patient size and/or use of iterative reconstruction technique. CONTRAST:  OMNIPAQUE IOHEXOL 300 MG/ML  SOLN COMPARISON:  None Available. FINDINGS: Urinary Tract: Bladder is well distended. Kidneys are not well visualized on this exam. Bowel: Visualized bowel shows no acute abnormality. The appendix is within normal limits. Vascular/Lymphatic: Mild atherosclerotic calcifications are seen. Reactive left inguinal lymph nodes are seen Reproductive:  Prostate is within normal limits. Other:  No free fluid is seen.  No focal herniation is noted. Musculoskeletal: In the perineum eccentric to the left, there is a focal  air-fluid collection identified which measures 6.7 cm in greatest AP dimension and 2.5 cm in transverse dimension. Craniocaudad extension is approximately 4 cm. These changes are consistent with a subcutaneous abscess. Some surrounding edema is noted. No involvement of the scrotum is seen. The edema extends into the medial aspect of the left calf. No bony abnormality is noted. IMPRESSION: Changes consistent with subcutaneous abscess in the perineum on the left with some surrounding edema. No scrotal involvement is seen at this time. No extension into the perirectal or  perianal region is seen. Electronically Signed   By: Alcide Clever M.D.   On: 02/18/2022 01:15   DG Chest Port 1 View  Result Date: 02/18/2022 CLINICAL DATA:  Questionable sepsis. EXAM: PORTABLE CHEST 1 VIEW COMPARISON:  Chest radiograph dated 02/02/2021. FINDINGS: Mild eventration of the right hemidiaphragm. No focal consolidation, pleural effusion or pneumothorax. Mild cardiomegaly. No acute osseous pathology. IMPRESSION: No active cardiopulmonary disease.  Five Electronically Signed   By: Elgie Collard M.D.   On: 02/18/2022 00:01    Anti-infectives: Anti-infectives (From admission, onward)    Start     Dose/Rate Route Frequency Ordered Stop   02/19/22 0600  vancomycin (VANCOREADY) IVPB 1250 mg/250 mL        1,250 mg 166.7 mL/hr over 90 Minutes Intravenous Every 12 hours 02/19/22 0532     02/18/22 1200  vancomycin (VANCOREADY) IVPB 1250 mg/250 mL  Status:  Discontinued        1,250 mg 166.7 mL/hr over 90 Minutes Intravenous Every 12 hours 02/17/22 2328 02/19/22 0532   02/18/22 0600  ceFEPIme (MAXIPIME) 2 g in sodium chloride 0.9 % 100 mL IVPB        2 g 200 mL/hr over 30 Minutes Intravenous Every 8 hours 02/17/22 2328     02/17/22 2245  ceFEPIme (MAXIPIME) 2 g in sodium chloride 0.9 % 100 mL IVPB        2 g 200 mL/hr over 30 Minutes Intravenous  Once 02/17/22 2232 02/18/22 0212   02/17/22 2245  metroNIDAZOLE (FLAGYL) IVPB 500 mg        500 mg 100 mL/hr over 60 Minutes Intravenous  Once 02/17/22 2232 02/18/22 0212   02/17/22 2245  vancomycin (VANCOCIN) IVPB 1000 mg/200 mL premix  Status:  Discontinued        1,000 mg 200 mL/hr over 60 Minutes Intravenous  Once 02/17/22 2232 02/17/22 2238   02/17/22 2245  vancomycin (VANCOREADY) IVPB 2000 mg/400 mL        2,000 mg 200 mL/hr over 120 Minutes Intravenous  Once 02/17/22 2238 02/18/22 0453        Assessment/Plan POD 1 s/p Incision and drainage perirectal abscess, Debridement of perirectal abscess, 2cm x 3cm x 2 cm by Dr. Janee Morn on  02/18/22 - Packing out, does not need to be replaced. Discussed wound care with patient.  - Start sitz baths - Cx's pending. Cont abx. Recommend 7d PO abx at d/c (Augmentin unless resistant on cx's when they result - he does not need to stay from our standpoint for final cx's)  - No undrained fluid collection noted on exam. Suspect induration L groin extended from perirectal abscess. No indication for any further procedures. - Will arrange f/u, okay for d/c from our standpoint. Pain medications sent to pharmacy. Discussed discharge instructions, restrictions and return/call back precautions. Will let [facility] know.   FEN - CM, IVF per [facility] VTE - SCDs, okay for chemical ppx from a general surgery standpoint ID - Currently  on Cefepime/Vanc.   Uncontrolled DM2 HTN Hx CHF HLD Prior CVA    LOS: 1 day    Jacinto Halim , [facility] Surgery 02/19/2022, 7:47 AM Please see Amion for pager number during day hours 7:00am-4:30pm

## 2022-02-19 NOTE — Progress Notes (Signed)
Discharge instructions reviewed with pt.  Copy of instructions given to pt. 1 TOC pharmacy med was delivered to pt's room, pt informed and aware of other meds, glucose meter and insulin supplies were sent to his home pharmacy.  Pt verbalizes understanding of wound care/sitz baths in bathtub, some supplies given.  Pt waiting for daughter to arrive.  Pt's unit RN aware.

## 2022-02-19 NOTE — Telephone Encounter (Signed)
Pharmacy Patient Advocate Encounter  Insurance verification completed.    The patient is insured through [employer]   The patient is currently admitted and ran test claims for the following: [place], Hospital doctor.  Copays and coinsurance results were relayed to Inpatient clinical team.

## 2022-02-19 NOTE — Progress Notes (Signed)
Pharmacy Antibiotic Note  Rodney Kane is a 46 y.o. male admitted on 02/17/2022 with groin and thigh thigh pain with an abscess and drainage.  Pharmacy has been consulted for cefepime and vancomycin dosing.  Plan: Cefepime 2g Q8h Vancomycin 1500 mg IV Q12h (eAUC 416, goal AUC 400-550, Scr 0.95, Vd 0.5)  Trend WBC, fever, renal function F/u cultures, clinical progress, levels as indicated De-escalate when able   Temp (24hrs), Avg:98.1 F (36.7 C), Min:97.3 F (36.3 C), Max:98.5 F (36.9 C)  Recent Labs  Lab 02/17/22 2248 02/18/22 0125 02/19/22 0414  WBC 12.5*  --  9.3  CREATININE 1.19  --  0.95  LATICACIDVEN 3.0* 2.0*  --     CrCl cannot be calculated (Unknown ideal weight.).    No Known Allergies  Antimicrobials this admission: Flagyl 8/01 x1 Cefepime 8/01 >>  Vancomycin 8/01 >>   Microbiology results: 7/31 BCx: ngtd 8/01 abscess cx: GPCs (prelim)  Thank you for allowing pharmacy to be a part of this patient's care.  Thelma Barge, PharmD Clinical Pharmacist

## 2022-02-19 NOTE — TOC Benefit Eligibility Note (Signed)
Patient Product/process development scientist completed.    The patient is currently admitted and upon discharge could be taking Levemir Pens.  The current 30 day co-pay is $100.00.   The patient is currently admitted and upon discharge could be taking Social research officer, government.  The current 30 day co-pay is $94.92.   The patient is insured through [employer]    Roland Earl, CPhT Pharmacy Patient Advocate Specialist [facility] Patient Advocate Team Direct Number: [phone]  Fax: [phone]

## 2022-02-19 NOTE — TOC Transition Note (Signed)
Transition of Care [facility]) - CM/SW Discharge Note   Patient Details  Name: Rodney Kane MRN: [id number] Date of Birth: 1976-04-11  [facility] CM/SW Contact:  Lawerance Sabal, RN Phone Number: 02/19/2022, 11:40 AM   Clinical Narrative:    Patient to DC to home.  No TOC needs identified for DC.  PCP Early Osmond, [facility] [employer]    Final next level of care: Home/Self Care Barriers to Discharge: No Barriers Identified   Patient Goals and CMS Choice        Discharge Placement                       Discharge Plan and Services                                     Social Determinants of Health (SDOH) Interventions     Readmission Risk Interventions     No data to display

## 2022-02-19 NOTE — Inpatient Diabetes Management (Addendum)
Inpatient Diabetes Program Recommendations  AACE/ADA: New Consensus Statement on Inpatient Glycemic Control (2015)  Target Ranges:  Prepandial:   less than 140 mg/dL      Peak postprandial:   less than 180 mg/dL (1-2 hours)      Critically ill patients:  140 - 180 mg/dL    Latest Reference Range & Units 12/05/21 12:41  Hemoglobin A1C 4.0 - 5.6 % 4.0 - 5.6 % 12.8 !   !: Data is abnormal  Latest Reference Range & Units 02/18/22 08:18 02/18/22 10:23 02/18/22 11:45 02/18/22 12:45 02/18/22 15:37 02/18/22 20:05  Glucose-Capillary 70 - 99 mg/dL 304 (H) 278 (H) 209 (H) 179 (H) 119 (H) 201 (H)  (H): Data is abnormally high  Latest Reference Range & Units 02/19/22 00:14 02/19/22 05:01 02/19/22 07:34  Glucose-Capillary 70 - 99 mg/dL 184 (H) 156 (H) 144 (H)  (H): Data is abnormally high  Admit with: Perineal abscess severe sepsis cellulitis  History: DM, CVA  Home DM Meds: Glipizide 10 mg BID        Januvia 25 mg daily (NOT taking)  Current Orders: Levemir 10 units BID      Novolog Resistant Correction Scale/ SSI (0-20 units) Q4 hours    I&D of abscess yesterday 8/1  Started Levemir last PM--CBGs improved this AM   MD- Note A1c in May was 12.8%.  Will need tight glucose control to help with healing and prevent further infection.  Are you considering insulin for home?  Perhaps Basal Insulin Once daily + his home oral meds?    I have asked Pharm tech to do benefits check on both Levemir and Basaglar basal insulins to see if affordable with pt's insurance.  Addendum 10am--Looks like co-pays for basal insulin might be too much?  If pt will need insulin for home, perhaps we could have him start NPH BID at home?  Pt can get NPH Insulin at [facility] out of pocket for $25 per vial or $43 for 5 pens.  Addendum 12pm--Met w/ pt at bedside to discuss A1c from May (12.8%) and to discuss going home on Insulin.  Pt told me he has used insulin (vial and syringe) in the past and is comfortable using  insulin again.  We discussed the NPH Insulin that pt will go home on--Explained to pt what NPH Insulin is, how it works, when to take, etc.  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  Wakes around 9-10am, works from 3p-11p, and eats when he returns home around midnight.  Instructed pt to make sure to eat when he wakes, take his NPH Insulin, and then eat when he returns home from work and take his insulin again.  Also discussed with pt importance of eating meals TID, s/sxs of Hypoglycemia and how to treat, instructions to reduce home dose of Glipizide to 5 mg BID, storage of insulin, importance of rotation of injection sites, etc.  Pt was also instructed to get his NPH Insulin out of pocket at [facility] for the lowest cost $25 per vial.    Also Spoke with patient about his current A1c of 12.8% from May 2023.  Explained what an A1c is and what it measures.  Reminded patient that his goal A1c is 7% or less per ADA standards to prevent both acute and long-term complications.  Explained to patient the extreme importance of good glucose control at home especially to help pt's abscess heal, prevent further abscess, and prevent other long-term complications.  Encouraged patient to check his CBGs at  least bid at home (prior to taking insulin) and to record all CBGs in a logbook for his PCP to review.  Strongly encouraged pt to follow up with his PCP after d/c.  CBG meter Rx given to pt by MD.  Also reminded pt to continue to take insulin and Glipizide as instructed (even if he starts to feel better and his wound heals, as long-term insulin may be needed for long-term CBG control).  Asked pt to call his PCP if he has issues with HYPO or continued Hyperglycemia at home.  Reviewed goal CBGs for home as well.        --Will follow patient during hospitalization--  Wyn Quaker RN, MSN, [place] Diabetes Coordinator Inpatient Glycemic Control Team Team Pager: [phone] (8a-5p)

## 2022-02-19 NOTE — Discharge Summary (Signed)
Physician Discharge Summary  Rodney Kane DOB: July 24, 1975 DOA: 02/17/2022  PCP: Barrett Henle, NP  Admit date: 02/17/2022 Discharge date: 02/19/2022  Admitted From: Home Disposition: Home  Recommendations for Outpatient Follow-up:  Follow up with PCP in 1-2 weeks Follow-up with general surgery as scheduled Outpatient follow-up with cardiology 4 weeks Continue Augmentin twice daily x7 days per general surgery recommendations for perirectal abscess s/p I&D Started on NPH insulin 10 units significantly twice daily for poorly controlled diabetes, will need further outpatient follow-up with PCP for further guidance/insulin titration Follow-up finalized operative and blood cultures which were pending at time of discharge  Home Health: No Equipment/Devices: None  Discharge Condition: Stable CODE STATUS: Full code Diet recommendation: Heart healthy/consistent carb regular diet  History of present illness:  Rodney Kane is a 46 year old male with past medical history significant for type 2 diabetes mellitus poorly controlled due to noncompliance, history of CVA with residual expressive aphasia, dyslipidemia, essential hypertension, dilated cardiomyopathy with HFrEF who presented to [facility] ED on 7/31 with groin/perirectal/thigh pain.  Patient reports onset Wednesday/Thursday of last week when in which he noted a knot in his left leg buttock region which progressively increased in size with some purulent drainage.  Patient was attempting to treat this outpatient using alcohol topically.  Upon evaluation in ED patient was exquisitely tender with indurated area lateral to the rectum.  Patient with temperature of 100.4, WBC count 12.5 with lactic acid 3.0.  CT pelvis notable for acute subcutaneous abscess in the perineal area without scrotal involvement..  General surgery was consulted.  Hospitalist consulted for further evaluation and management of perirectal  abscess.  Hospital course:  Perirectal abscess Patient presenting to ED with progressive pain to groin/perirectal area.  WBC count elevated at 12.5 with a lactic acid 3.0 and temperature 100.4.  CT pelvis notable for acute subcutaneous abscess in the perirectal region without scrotal involvement.  Patient was started on empiric antibiotics with vancomycin and cefepime.  General surgery was consulted and patient underwent incision, drainage and debridement of perirectal abscess by Dr. Grandville Silos on 02/18/2022.  Okay for discharge home with outpatient follow-up with general surgery with 7-day course of oral Augmentin.  Operative cultures showing GPC's, finalized identification supposedly is pending at time of discharge.  Blood culture showed no growth during hospitalization but finalized cultures pending at time of discharge as well.  Type 2 diabetes mellitus, with hyperglycemia Hemoglobin A1c 12.8 12/05/2021, poorly controlled.  Etiology likely noncompliance and dietary indiscretions.  Glipizide reduced to 5 mg p.o. twice daily.  Started on NPH 70/3010 units obediently twice daily.  Outpatient follow-up with PCP for further monitoring/titration.  Dilated cardiomyopathy Chronic systolic congestive heart failure, compensated Essential hypertension TTE 06/11/2021 with LVEF 30-35%, LV moderately decreased function, LV global hypokinesis, LV moderately dilated, LA moderately dilated, aortic dilation ascending aorta measuring 41 mm. Continue metoprolol succinate 50 mg p.o. daily, losartan 25 mg p.o. daily, spironolactone 25 mg p.o. daily, torsemide 20 mg p.o. daily.  Outpatient follow-up with cardiology.  Aortic aneurysm TTE from November 2022 with dilated ascending aorta measuring 41 mm.  Outpatient follow-up with cardiology for further surveillance  Dyslipidemia: Continue atorvastatin   Discharge Diagnoses:  Principal Problem:   Cellulitis of groin Active Problems:   Nonischemic cardiomyopathy (HCC)    CKD (chronic kidney disease), stage II   Essential hypertension   Type 2 diabetes mellitus with complication, without long-term current use of insulin ([place])   Groin abscess    Discharge Instructions  Discharge  Instructions     Call MD for:  difficulty breathing, headache or visual disturbances   Complete by: As directed    Call MD for:  extreme fatigue   Complete by: As directed    Call MD for:  persistant dizziness or light-headedness   Complete by: As directed    Call MD for:  persistant nausea and vomiting   Complete by: As directed    Call MD for:  severe uncontrolled pain   Complete by: As directed    Call MD for:  temperature >100.4   Complete by: As directed    Diet - low sodium heart healthy   Complete by: As directed    Increase activity slowly   Complete by: As directed    No wound care   Complete by: As directed       Allergies as of 02/19/2022   No Known Allergies      Medication List     TAKE these medications    amoxicillin-clavulanate 875-125 MG tablet Commonly known as: AUGMENTIN Take 1 tablet by mouth 2 (two) times daily for 7 days.   atorvastatin 40 MG tablet Commonly known as: LIPITOR Take 1 tablet (40 mg total) by mouth daily.   blood glucose meter kit and supplies Dispense based on patient and insurance preference. Use up to four times daily as directed. (FOR ICD-10 E10.9, E11.9).   glipiZIDE 5 MG tablet Commonly known as: GLUCOTROL Take 1 tablet (5 mg total) by mouth 2 (two) times daily. What changed:  medication strength how much to take when to take this   Insulin Syringes (Disposable) U-100 0.3 ML Misc Use as directed with insulin vial   losartan 25 MG tablet Commonly known as: COZAAR Take 1 tablet (25 mg total) by mouth daily. Start taking on: February 20, 2022 What changed:  medication strength how much to take   metoprolol succinate 50 MG 24 hr tablet Commonly known as: TOPROL-XL Take 1 tablet (50 mg total) by mouth  daily. Take with or immediately following a meal.   NovoLIN 70/30 ReliOn (70-30) 100 UNIT/ML injection Generic drug: insulin NPH-regular Human Inject 10 Units into the skin 2 (two) times daily with a meal.   oxyCODONE 5 MG immediate release tablet Commonly known as: Oxy IR/ROXICODONE Take 1 tablet (5 mg total) by mouth every 6 (six) hours as needed for breakthrough pain.   spironolactone 25 MG tablet Commonly known as: ALDACTONE Take 1 tablet (25 mg total) by mouth daily.   torsemide 20 MG tablet Commonly known as: Demadex Take 1 tablet (20 mg total) by mouth daily.        Follow-up Information     Surgery, [place]. Call in 1 day(s).   Specialty: General Surgery Why: Please call to confirm your appointment date and time. We are working hard to make this for you. Please bring a copy of your photo ID and insurance card. Arrive to your appointment 30 minutes early for paperwork. Contact information: [address] [place] [address] [phone]         Barrett Henle, NP. Schedule an appointment as soon as possible for a visit in 1 week(s).   Specialty: Family Medicine Contact information: [address] [place] [address] [phone]         Leonie Man, MD. Schedule an appointment as soon as possible for a visit in 4 week(s).   Specialty: Cardiology Contact information: [address]  [place] [address] [phone]                No Known Allergies  Consultations: General surgery, Dr. Grandville Silos   Procedures/Studies: CT PELVIS W CONTRAST  Result Date: 02/18/2022 CLINICAL DATA:  Peroneal abscess EXAM: CT PELVIS WITH CONTRAST TECHNIQUE: Multidetector CT imaging of the pelvis was performed using the standard protocol following the bolus administration of intravenous contrast. RADIATION DOSE REDUCTION: This exam was performed according to the departmental dose-optimization program which includes  automated exposure control, adjustment of the mA and/or kV according to patient size and/or use of iterative reconstruction technique. CONTRAST:  152m OMNIPAQUE IOHEXOL 300 MG/ML  SOLN COMPARISON:  None Available. FINDINGS: Urinary Tract: Bladder is well distended. Kidneys are not well visualized on this exam. Bowel: Visualized bowel shows no acute abnormality. The appendix is within normal limits. Vascular/Lymphatic: Mild atherosclerotic calcifications are seen. Reactive left inguinal lymph nodes are seen Reproductive:  Prostate is within normal limits. Other:  No free fluid is seen.  No focal herniation is noted. Musculoskeletal: In the perineum eccentric to the left, there is a focal air-fluid collection identified which measures 6.7 cm in greatest AP dimension and 2.5 cm in transverse dimension. Craniocaudad extension is approximately 4 cm. These changes are consistent with a subcutaneous abscess. Some surrounding edema is noted. No involvement of the scrotum is seen. The edema extends into the medial aspect of the left calf. No bony abnormality is noted. IMPRESSION: Changes consistent with subcutaneous abscess in the perineum on the left with some surrounding edema. No scrotal involvement is seen at this time. No extension into the perirectal or perianal region is seen. Electronically Signed   By: MInez CatalinaM.D.   On: 02/18/2022 01:15   DG Chest Port 1 View  Result Date: 02/18/2022 CLINICAL DATA:  Questionable sepsis. EXAM: PORTABLE CHEST 1 VIEW COMPARISON:  Chest radiograph dated 02/02/2021. FINDINGS: Mild eventration of the right hemidiaphragm. No focal consolidation, pleural effusion or pneumothorax. Mild cardiomegaly. No acute osseous pathology. IMPRESSION: No active cardiopulmonary disease.  Five Electronically Signed   By: AAnner CreteM.D.   On: 02/18/2022 00:01     Subjective: Patient seen and examined bedside, resting comfortably.  No complaints this morning.  Okay for discharge home on  oral antibiotics per general surgery with outpatient follow-up scheduled.  Discussed with patient extensively needs much better control of his diabetes to ensure no further complicating factors moving forward.  Discussed starting on NPH insulin and close follow-up with PCP and cardiology.  No other questions or concerns at this time.  Denies headache, no fever/chills/night sweats, no nausea/vomiting/diarrhea, no chest pain, no palpitations, no shortness of breath, no abdominal pain, no focal weakness, no fatigue, no paresthesias.  No acute events overnight per nursing staff.  Discharge Exam: Vitals:   02/19/22 0502 02/19/22 0733  BP: (!) 140/93 135/74  Pulse: 75 76  Resp: 18 17  Temp: 98.3 F (36.8 C) 98.4 F (36.9 C)  SpO2: 100% 100%   Vitals:   02/18/22 1534 02/18/22 2007 02/19/22 0502 02/19/22 0733  BP: 107/70 117/82 (!) 140/93 135/74  Pulse: 77 81 75 76  Resp: 17 18 18 17   Temp: 98 F (36.7 C) 97.8 F (36.6 C) 98.3 F (36.8 C) 98.4 F (36.9 C)  TempSrc: Oral Oral Oral Oral  SpO2: 99% 100% 100% 100%    Physical Exam: GEN: NAD, alert and oriented x 3, wd/wn HEENT: NCAT, PERRL, EOMI, sclera clear, MMM PULM: CTAB w/o wheezes/crackles, normal respiratory  effort, on room air CV: RRR w/o M/G/R GI: abd soft, NTND, NABS, no R/G/M GU: No drainage from I&D site with some induration but no erythema MSK: no peripheral edema, muscle strength globally intact 5/5 bilateral upper/lower extremities NEURO: CN II-XII intact, no focal deficits, sensation to light touch intact PSYCH: normal mood/affect Integumentary: GU exam as above, otherwise no other concerning rashes/lesions/wounds     The results of significant diagnostics from this hospitalization (including imaging, microbiology, ancillary and laboratory) are listed below for reference.     Microbiology: Recent Results (from the past 240 hour(s))  Blood Culture (routine x 2)     Status: None (Preliminary result)   Collection Time:  02/17/22 10:15 PM   Specimen: BLOOD  Result Value Ref Range Status   Specimen Description BLOOD LEFT ANTECUBITAL  Final   Special Requests   Final    BOTTLES DRAWN AEROBIC AND ANAEROBIC Blood Culture adequate volume   Culture   Final    NO GROWTH 2 DAYS Performed at [facility], [name], [place], [place] [address]    Report Status PENDING  Incomplete  Resp Panel by RT-PCR (Flu A&B, Covid) Peripheral     Status: None   Collection Time: 02/17/22 10:31 PM   Specimen: Peripheral; Nasal Swab  Result Value Ref Range Status   SARS Coronavirus 2 by RT PCR NEGATIVE NEGATIVE Final    Comment: (NOTE) SARS-CoV-2 target nucleic acids are NOT DETECTED.  The SARS-CoV-2 RNA is generally detectable in upper respiratory specimens during the acute phase of infection. The lowest concentration of SARS-CoV-2 viral copies this assay can detect is 138 copies/mL. A negative result does not preclude SARS-Cov-2 infection and should not be used as the sole basis for treatment or other patient management decisions. A negative result may occur with  improper specimen collection/handling, submission of specimen other than nasopharyngeal swab, presence of viral mutation(s) within the areas targeted by this assay, and inadequate number of viral copies(<138 copies/mL). A negative result must be combined with clinical observations, patient history, and epidemiological information. The expected result is Negative.  Fact Sheet for Patients:  EntrepreneurPulse.com.au  Fact Sheet for Healthcare Providers:  IncredibleEmployment.be  This test is no t yet approved or cleared by the [place] FDA and  has been authorized for detection and/or diagnosis of SARS-CoV-2 by FDA under an Emergency Use Authorization (EUA). This EUA will remain  in effect (meaning this test can be used) for the duration of the COVID-19 declaration under Section 564(b)(1) of the Act,  21 U.S.C.section 360bbb-3(b)(1), unless the authorization is terminated  or revoked sooner.       Influenza A by PCR NEGATIVE NEGATIVE Final   Influenza B by PCR NEGATIVE NEGATIVE Final    Comment: (NOTE) The Xpert Xpress SARS-CoV-2/FLU/RSV plus assay is intended as an aid in the diagnosis of influenza from Nasopharyngeal swab specimens and should not be used as a sole basis for treatment. Nasal washings and aspirates are unacceptable for Xpert Xpress SARS-CoV-2/FLU/RSV testing.  Fact Sheet for Patients: EntrepreneurPulse.com.au  Fact Sheet for Healthcare Providers: IncredibleEmployment.be  This test is not yet approved or cleared by the [place] FDA and has been authorized for detection and/or diagnosis of SARS-CoV-2 by FDA under an Emergency Use Authorization (EUA). This EUA will remain in effect (meaning this test can be used) for the duration of the COVID-19 declaration under Section 564(b)(1) of the Act, 21 U.S.C. section 360bbb-3(b)(1), unless the authorization is terminated or revoked.  Performed at  [facility], [place] [address], [place], [place] [address]   Blood Culture (routine x 2)     Status: None (Preliminary result)   Collection Time: 02/17/22 10:49 PM   Specimen: BLOOD  Result Value Ref Range Status   Specimen Description BLOOD RIGHT ANTECUBITAL  Final   Special Requests   Final    BOTTLES DRAWN AEROBIC ONLY Blood Culture adequate volume   Culture   Final    NO GROWTH 2 DAYS Performed at [facility], [place] [address] [place], [place] [address]    Report Status PENDING  Incomplete  Aerobic/Anaerobic Culture w Gram Stain (surgical/deep wound)     Status: None (Preliminary result)   Collection Time: 02/18/22 11:19 AM   Specimen: PATH Other; Body Fluid  Result Value Ref Range Status   Specimen Description PERIRECTAL ABSCESS  Final   Special Requests NONE  Final   Gram Stain   Final    NO WBC SEEN FEW  GRAM POSITIVE COCCI IN PAIRS Performed at [facility], [address], [place], [place] [address]    Culture MODERATE GRAM NEGATIVE RODS  Final   Report Status PENDING  Incomplete     Labs: BNP (last 3 results) Recent Labs    06/10/21 1116  BNP 720.9*   Basic Metabolic Panel: Recent Labs  Lab 02/17/22 2248 02/19/22 0414  NA 131* 137  K 4.4 4.1  CL 95* 103  CO2 25 28  GLUCOSE 467* 186*  BUN 14 8  CREATININE 1.19 0.95  CALCIUM 9.2 8.4*   Liver Function Tests: Recent Labs  Lab 02/17/22 2248 02/19/22 0414  AST 12* 12*  ALT 13 12  ALKPHOS 95 61  BILITOT 1.4* 0.5  PROT 7.9 6.2*  ALBUMIN 3.1* 2.4*   No results for input(s): "LIPASE", "AMYLASE" in the last 168 hours. No results for input(s): "AMMONIA" in the last 168 hours. CBC: Recent Labs  Lab 02/17/22 2248 02/19/22 0414  WBC 12.5* 9.3  NEUTROABS 9.9*  --   HGB 14.4 12.1*  HCT 45.5 37.7*  MCV 81.7 82.3  PLT 281 250   Cardiac Enzymes: No results for input(s): "CKTOTAL", "CKMB", "CKMBINDEX", "TROPONINI" in the last 168 hours. BNP: Invalid input(s): "POCBNP" CBG: Recent Labs  Lab 02/18/22 1537 02/18/22 2005 02/19/22 0014 02/19/22 0501 02/19/22 0734  GLUCAP 119* 201* 184* 156* 144*   D-Dimer No results for input(s): "DDIMER" in the last 72 hours. Hgb A1c No results for input(s): "HGBA1C" in the last 72 hours. Lipid Profile No results for input(s): "CHOL", "HDL", "LDLCALC", "TRIG", "CHOLHDL", "LDLDIRECT" in the last 72 hours. Thyroid function studies Recent Labs    02/18/22 0910  TSH 1.326   Anemia work up No results for input(s): "VITAMINB12", "FOLATE", "FERRITIN", "TIBC", "IRON", "RETICCTPCT" in the last 72 hours. Urinalysis    Component Value Date/Time   COLORURINE STRAW (A) 02/17/2022 2231   APPEARANCEUR CLEAR 02/17/2022 2231   LABSPEC 1.028 02/17/2022 2231   PHURINE 5.0 02/17/2022 2231   GLUCOSEU >=500 (A) 02/17/2022 2231   HGBUR SMALL (A) 02/17/2022 2231   BILIRUBINUR  NEGATIVE 02/17/2022 2231   BILIRUBINUR negative 12/05/2021 1238   BILIRUBINUR neg 01/04/2020 1333   KETONESUR 20 (A) 02/17/2022 2231   PROTEINUR NEGATIVE 02/17/2022 2231   UROBILINOGEN 0.2 12/05/2021 1238   UROBILINOGEN 0.2 10/07/2017 1158   NITRITE NEGATIVE 02/17/2022 2231   LEUKOCYTESUR NEGATIVE 02/17/2022 2231   Sepsis Labs Recent Labs  Lab 02/17/22 2248 02/19/22 0414  WBC 12.5* 9.3  Microbiology Recent Results (from the past 240 hour(s))  Blood Culture (routine x 2)     Status: None (Preliminary result)   Collection Time: 02/17/22 10:15 PM   Specimen: BLOOD  Result Value Ref Range Status   Specimen Description BLOOD LEFT ANTECUBITAL  Final   Special Requests   Final    BOTTLES DRAWN AEROBIC AND ANAEROBIC Blood Culture adequate volume   Culture   Final    NO GROWTH 2 DAYS Performed at [facility], [address], [place], [place] [address]    Report Status PENDING  Incomplete  Resp Panel by RT-PCR (Flu A&B, Covid) Peripheral     Status: None   Collection Time: 02/17/22 10:31 PM   Specimen: Peripheral; Nasal Swab  Result Value Ref Range Status   SARS Coronavirus 2 by RT PCR NEGATIVE NEGATIVE Final    Comment: (NOTE) SARS-CoV-2 target nucleic acids are NOT DETECTED.  The SARS-CoV-2 RNA is generally detectable in upper respiratory specimens during the acute phase of infection. The lowest concentration of SARS-CoV-2 viral copies this assay can detect is 138 copies/mL. A negative result does not preclude SARS-Cov-2 infection and should not be used as the sole basis for treatment or other patient management decisions. A negative result may occur with  improper specimen collection/handling, submission of specimen other than nasopharyngeal swab, presence of viral mutation(s) within the areas targeted by this assay, and inadequate number of viral copies(<138 copies/mL). A negative result must be combined with clinical observations, patient history, and  epidemiological information. The expected result is Negative.  Fact Sheet for Patients:  EntrepreneurPulse.com.au  Fact Sheet for Healthcare Providers:  IncredibleEmployment.be  This test is no t yet approved or cleared by the [place] FDA and  has been authorized for detection and/or diagnosis of SARS-CoV-2 by FDA under an Emergency Use Authorization (EUA). This EUA will remain  in effect (meaning this test can be used) for the duration of the COVID-19 declaration under Section 564(b)(1) of the Act, 21 U.S.C.section 360bbb-3(b)(1), unless the authorization is terminated  or revoked sooner.       Influenza A by PCR NEGATIVE NEGATIVE Final   Influenza B by PCR NEGATIVE NEGATIVE Final    Comment: (NOTE) The Xpert Xpress SARS-CoV-2/FLU/RSV plus assay is intended as an aid in the diagnosis of influenza from Nasopharyngeal swab specimens and should not be used as a sole basis for treatment. Nasal washings and aspirates are unacceptable for Xpert Xpress SARS-CoV-2/FLU/RSV testing.  Fact Sheet for Patients: EntrepreneurPulse.com.au  Fact Sheet for Healthcare Providers: IncredibleEmployment.be  This test is not yet approved or cleared by the [place] FDA and has been authorized for detection and/or diagnosis of SARS-CoV-2 by FDA under an Emergency Use Authorization (EUA). This EUA will remain in effect (meaning this test can be used) for the duration of the COVID-19 declaration under Section 564(b)(1) of the Act, 21 U.S.C. section 360bbb-3(b)(1), unless the authorization is terminated or revoked.  Performed at [facility], [place] [address] [place], [place] [address]   Blood Culture (routine x 2)     Status: None (Preliminary result)   Collection Time: 02/17/22 10:49 PM   Specimen: BLOOD  Result Value Ref Range Status   Specimen Description BLOOD RIGHT ANTECUBITAL  Final   Special Requests    Final    BOTTLES DRAWN AEROBIC ONLY Blood Culture adequate volume   Culture   Final    NO GROWTH 2 DAYS Performed at [facility], [facility]  [address] [place], [place] [address]    Report Status PENDING  Incomplete  Aerobic/Anaerobic Culture w Gram Stain (surgical/deep wound)     Status: None (Preliminary result)   Collection Time: 02/18/22 11:19 AM   Specimen: PATH Other; Body Fluid  Result Value Ref Range Status   Specimen Description PERIRECTAL ABSCESS  Final   Special Requests NONE  Final   Gram Stain   Final    NO WBC SEEN FEW GRAM POSITIVE COCCI IN PAIRS Performed at [facility], [address] [place], [place] [address]    Culture MODERATE GRAM NEGATIVE RODS  Final   Report Status PENDING  Incomplete     Time coordinating discharge: Over 30 minutes  SIGNED:   Carron Mcmurry J [place] DO  [facility] 02/19/2022, 11:06 AM

## 2022-02-19 NOTE — Progress Notes (Signed)
  Echocardiogram 2D Echocardiogram has been performed.  [name] 02/19/2022, 11:09 AM

## 2022-02-22 LAB — CULTURE, BLOOD (ROUTINE X 2)
Culture: NO GROWTH
Culture: NO GROWTH
Special Requests: ADEQUATE
Special Requests: ADEQUATE

## 2022-02-23 LAB — AEROBIC/ANAEROBIC CULTURE W GRAM STAIN (SURGICAL/DEEP WOUND): Gram Stain: NONE SEEN

## 2022-03-11 DIAGNOSIS — L02215 Cutaneous abscess of perineum: Secondary | ICD-10-CM | POA: Diagnosis not present

## 2022-03-11 DIAGNOSIS — Z9889 Other specified postprocedural states: Secondary | ICD-10-CM | POA: Diagnosis not present

## 2022-07-30 ENCOUNTER — Encounter (HOSPITAL_COMMUNITY): Payer: Self-pay

## 2022-07-30 ENCOUNTER — Emergency Department (HOSPITAL_COMMUNITY): Payer: No Typology Code available for payment source

## 2022-07-30 ENCOUNTER — Other Ambulatory Visit: Payer: Self-pay

## 2022-07-30 ENCOUNTER — Emergency Department (HOSPITAL_COMMUNITY)
Admission: EM | Admit: 2022-07-30 | Discharge: 2022-07-30 | Disposition: A | Discharge status: Other Acute Inpatient Hospital | Payer: No Typology Code available for payment source | Attending: Emergency Medicine | Admitting: Emergency Medicine

## 2022-07-30 DIAGNOSIS — E119 Type 2 diabetes mellitus without complications: Secondary | ICD-10-CM | POA: Insufficient documentation

## 2022-07-30 DIAGNOSIS — W312XXA Contact with powered woodworking and forming machines, initial encounter: Secondary | ICD-10-CM | POA: Diagnosis not present

## 2022-07-30 DIAGNOSIS — Z794 Long term (current) use of insulin: Secondary | ICD-10-CM | POA: Insufficient documentation

## 2022-07-30 DIAGNOSIS — Z8673 Personal history of transient ischemic attack (TIA), and cerebral infarction without residual deficits: Secondary | ICD-10-CM | POA: Diagnosis not present

## 2022-07-30 DIAGNOSIS — Y9389 Activity, other specified: Secondary | ICD-10-CM | POA: Insufficient documentation

## 2022-07-30 DIAGNOSIS — S68121A Partial traumatic metacarpophalangeal amputation of left index finger, initial encounter: Secondary | ICD-10-CM | POA: Insufficient documentation

## 2022-07-30 DIAGNOSIS — I10 Essential (primary) hypertension: Secondary | ICD-10-CM | POA: Diagnosis not present

## 2022-07-30 DIAGNOSIS — Z7984 Long term (current) use of oral hypoglycemic drugs: Secondary | ICD-10-CM | POA: Diagnosis not present

## 2022-07-30 DIAGNOSIS — S6992XA Unspecified injury of left wrist, hand and finger(s), initial encounter: Secondary | ICD-10-CM

## 2022-07-30 DIAGNOSIS — Z79899 Other long term (current) drug therapy: Secondary | ICD-10-CM | POA: Insufficient documentation

## 2022-07-30 DIAGNOSIS — S68123A Partial traumatic metacarpophalangeal amputation of left middle finger, initial encounter: Secondary | ICD-10-CM | POA: Diagnosis not present

## 2022-07-30 DIAGNOSIS — S68119A Complete traumatic metacarpophalangeal amputation of unspecified finger, initial encounter: Secondary | ICD-10-CM

## 2022-07-30 LAB — CBC WITH DIFFERENTIAL/PLATELET
Abs Immature Granulocytes: 0.02 10*3/uL (ref 0.00–0.07)
Basophils Absolute: 0.1 10*3/uL (ref 0.0–0.1)
Basophils Relative: 1 %
Eosinophils Absolute: 0.2 10*3/uL (ref 0.0–0.5)
Eosinophils Relative: 3 %
HCT: 48.7 % (ref 39.0–52.0)
Hemoglobin: 15.1 g/dL (ref 13.0–17.0)
Immature Granulocytes: 0 %
Lymphocytes Relative: 37 %
Lymphs Abs: 2.6 10*3/uL (ref 0.7–4.0)
MCH: 25.9 pg — ABNORMAL LOW (ref 26.0–34.0)
MCHC: 31 g/dL (ref 30.0–36.0)
MCV: 83.4 fL (ref 80.0–100.0)
Monocytes Absolute: 0.8 10*3/uL (ref 0.1–1.0)
Monocytes Relative: 11 %
Neutro Abs: 3.3 10*3/uL (ref 1.7–7.7)
Neutrophils Relative %: 48 %
Platelets: 266 10*3/uL (ref 150–400)
RBC: 5.84 MIL/uL — ABNORMAL HIGH (ref 4.22–5.81)
RDW: 12.9 % (ref 11.5–15.5)
WBC: 6.9 10*3/uL (ref 4.0–10.5)
nRBC: 0 % (ref 0.0–0.2)

## 2022-07-30 LAB — BASIC METABOLIC PANEL
Anion gap: 11 (ref 5–15)
BUN: 13 mg/dL (ref 6–20)
CO2: 26 mmol/L (ref 22–32)
Calcium: 8.7 mg/dL — ABNORMAL LOW (ref 8.9–10.3)
Chloride: 99 mmol/L (ref 98–111)
Creatinine, Ser: 1.15 mg/dL (ref 0.61–1.24)
GFR, Estimated: >60 mL/min (ref >60)
Glucose, Bld: 296 mg/dL — ABNORMAL HIGH (ref 70–99)
Potassium: 3.7 mmol/L (ref 3.5–5.1)
Sodium: 136 mmol/L (ref 135–145)

## 2022-07-30 LAB — PROTIME-INR
INR: 1.1 (ref 0.8–1.2)
Prothrombin Time: 14.2 seconds (ref 11.4–15.2)

## 2022-07-30 LAB — SAMPLE TO BLOOD BANK

## 2022-07-30 LAB — APTT: aPTT: 28 seconds (ref 24–36)

## 2022-07-30 MED ORDER — CEFAZOLIN SODIUM-DEXTROSE 2-4 GM/100ML-% IV SOLN
2.0000 g | Freq: Once | INTRAVENOUS | Status: AC
  Administered 2022-07-30: 2 g via INTRAVENOUS
  Filled 2022-07-30: qty 100

## 2022-07-30 MED ORDER — MORPHINE SULFATE (PF) 4 MG/ML IV SOLN
6.0000 mg | Freq: Once | INTRAVENOUS | Status: AC
  Administered 2022-07-30: 6 mg via INTRAVENOUS
  Filled 2022-07-30: qty 2

## 2022-07-30 MED ORDER — ONDANSETRON HCL 4 MG/2ML IJ SOLN
4.0000 mg | Freq: Once | INTRAMUSCULAR | Status: AC
  Administered 2022-07-30: 4 mg via INTRAVENOUS
  Filled 2022-07-30: qty 2

## 2022-07-30 NOTE — ED Triage Notes (Signed)
Pt BIB [facility] from work after pt accidentally amputated his first two digits of L hand (index and middle finger) while cutting metal pipe. Bleeding controlled, no LOC, no c/o pain just burning. AO4, VSS

## 2022-07-30 NOTE — ED Provider Notes (Signed)
[facility] EMERGENCY DEPARTMENT Provider Note   CSN: [id number] Arrival date & time: 07/30/22  2032     History  Chief Complaint  Patient presents with   Hand Injury    Rodney Kane is a 47 y.o. male.  47 yo M with with history of DM, HTN, HLD, stroke with residual speech deficits, and NICM who presents with finger amputation. Was operating band saw at approximately 820 pm when he sought off his left pointer and middle finger.  Says that he is left-hand-dominant and works at a [employer].  Coworkers placed his fingers on ice.  Says he is not on blood thinners.  Last ate at 6:30 PM when he was having some popcorn.  No known drug allergies.  Last tetanus shot was in 2019.  No lightheadedness or dizziness.       Home Medications Prior to Admission medications   Medication Sig Start Date End Date Taking? Authorizing Provider  glipiZIDE (GLUCOTROL) 5 MG tablet Take 1 tablet (5 mg total) by mouth 2 (two) times daily. 02/19/22 07/30/22 Yes [place] Eric J, DO  atorvastatin (LIPITOR) 40 MG tablet Take 1 tablet (40 mg total) by mouth daily. Patient not taking: Reported on 07/30/2022 02/19/22 05/20/22  [place] Eric J, DO  blood glucose meter kit and supplies Dispense based on patient and insurance preference. Use up to four times daily as directed. (FOR ICD-10 E10.9, E11.9). 02/19/22   [place] Donnamarie Poag, DO  insulin NPH-regular Human (NOVOLIN 70/30 RELION) (70-30) 100 UNIT/ML injection Inject 10 Units into the skin 2 (two) times daily with a meal. Patient not taking: Reported on 07/30/2022 02/19/22 05/20/22  [place] Donnamarie Poag, DO  Insulin Syringes, Disposable, U-100 0.3 ML MISC Use as directed with insulin vial 02/19/22   [place] Eric J, DO  losartan (COZAAR) 25 MG tablet Take 1 tablet (25 mg total) by mouth daily. Patient not taking: Reported on 07/30/2022 02/20/22 05/21/22  [place] Eric J, DO  metoprolol succinate (TOPROL-XL) 50 MG 24 hr tablet Take 1 tablet (50 mg total) by  mouth daily. Take with or immediately following a meal. Patient not taking: Reported on 07/30/2022 02/19/22 05/20/22  [place] Donnamarie Poag, DO  oxyCODONE (OXY IR/ROXICODONE) 5 MG immediate release tablet Take 1 tablet (5 mg total) by mouth every 6 (six) hours as needed for breakthrough pain. Patient not taking: Reported on 07/30/2022 02/19/22   Jillyn Ledger, PA-C  spironolactone (ALDACTONE) 25 MG tablet Take 1 tablet (25 mg total) by mouth daily. Patient not taking: Reported on 07/30/2022 02/19/22 05/20/22  [place] Eric J, DO  torsemide (DEMADEX) 20 MG tablet Take 1 tablet (20 mg total) by mouth daily. Patient not taking: Reported on 07/30/2022 02/19/22 05/20/22  [place] Eric J, DO      Allergies    Patient has no known allergies.    Review of Systems   Review of Systems  Physical Exam Updated Vital Signs BP (!) 165/110   Pulse (!) 107   Temp 98.6 F (37 C) (Oral)   Resp (!) 22   Ht 6\' 1"  (1.854 m)   Wt (!) 147 kg   SpO2 100%   BMI 42.75 kg/m  Physical Exam Vitals and nursing note reviewed.  Constitutional:      General: He is not in acute distress.    Appearance: He is well-developed.  HENT:     Head: Normocephalic and atraumatic.     Right Ear: External ear normal.     Left Ear:  External ear normal.     Nose: Nose normal.  Eyes:     Extraocular Movements: Extraocular movements intact.     Conjunctiva/sclera: Conjunctivae normal.     Pupils: Pupils are equal, round, and reactive to light.  Cardiovascular:     Rate and Rhythm: Normal rate and regular rhythm.  Pulmonary:     Effort: Pulmonary effort is normal. No respiratory distress.  Musculoskeletal:     Cervical back: Normal range of motion and neck supple.     Comments: Radial pulse 2+ left upper extremity.  No active bleeding from wounds.  See below for images.  Left pointer finger amputation right at the PIP joint.  Left middle finger amputation just distal to the PIP joint.  Skin:    General: Skin is warm and dry.   Neurological:     Mental Status: He is alert.    Left hand       ED Results / Procedures / Treatments   Labs (all labs ordered are listed, but only abnormal results are displayed) Labs Reviewed  CBC WITH DIFFERENTIAL/PLATELET - Abnormal; Notable for the following components:      Result Value   RBC 5.84 (*)    MCH 25.9 (*)    All other components within normal limits  PROTIME-INR  APTT  BASIC METABOLIC PANEL  SAMPLE TO BLOOD BANK    EKG None  Radiology No results found.  Procedures Procedures   Medications Ordered in ED Medications  ceFAZolin (ANCEF) IVPB 2g/100 mL premix (2 g Intravenous New Bag/Given 07/30/22 2114)  morphine (PF) 4 MG/ML injection 6 mg (6 mg Intravenous Given 07/30/22 2100)  ondansetron (ZOFRAN) injection 4 mg (4 mg Intravenous Given 07/30/22 2057)    ED Course/ Medical Decision Making/ A&P Clinical Course as of 07/30/22 2123  Wed Jul 30, 2022  2100 Discussed with Dr. Orlan Leavens from [facility] who was unable to do finger reimplantation recommends consulting [facility].  Dr Thad Ranger from plastic surgery consulted from [facility] who will evaluate the patient in the emergency department and is excepted for transfer. [RP]    Clinical Course User Index [RP] Rondel Baton, MD                           Medical Decision Making Amount and/or Complexity of Data Reviewed Labs: ordered. Radiology: ordered.  Risk Prescription drug management.   Rodney Kane is a 47 y.o. male with comorbidities that complicate the patient evaluation including DM, HTN, HLD, stroke with residual speech deficits, and NICM who presents with finger amputation.    Initial Ddx:  Finger amputation, open fracture  MDM:  With patient's finger amputation feel that his fingers may be viable for reimplantation since they were placed on ice and this happened just minutes before arrival.  Will continue to keep his fingers on ice and talk to hand  surgery at this time.  Will also give him Ancef.  Is up-to-date on his tetanus shot so feel it is not indicated.  Will also send labs and obtain x-ray at this time as well.  Plan:  Labs Hand x-ray Morphine Zofran  ED Summary/Re-evaluation:  Talk to Dr. Orlan Leavens from hand surgery at our institution.  Reports that the patient will have to be transferred to [facility] since no one performs reimplantation care at [facility].  Discussed with Dr. Thad Ranger from [facility] who is excepted the patient to ED to ED  transfer and they will evaluate him there to see if reimplantation is possible.  Arranging for transport at this time.  This patient presents to the ED for concern of complaints listed in HPI, this involves an extensive number of treatment options, and is a complaint that carries with it a high risk of complications and morbidity. Disposition including potential need for admission considered.   Dispo: [place] emergency department  Additional history obtained from EMS Records reviewed Outpatient Clinic Notes The following labs were independently interpreted: CBC and show no acute abnormality I independently reviewed the following imaging with scope of interpretation limited to determining acute life threatening conditions related to emergency care: Extremity x-ray(s) and agree with the radiologist interpretation with the following exceptions: none I personally reviewed and interpreted cardiac monitoring: normal sinus rhythm  I personally reviewed and interpreted the pt's EKG: see above for interpretation  I have reviewed the patients home medications and made adjustments as needed Consults:  hand surgery Social Determinants of health:  none  Final Clinical Impression(s) / ED Diagnoses Final diagnoses:  Injury of finger of left hand, initial encounter  Finger amputation, traumatic, initial encounter    Rx / DC Orders ED Discharge Orders     None          Fransico Meadow, MD 07/30/22 2123

## 2022-07-30 NOTE — ED Notes (Signed)
Pt transferred to [facility] ED via [place].

## 2022-07-30 NOTE — ED Notes (Signed)
X-ray was pushed and care link was called

## 2022-07-30 NOTE — ED Notes (Signed)
2mg  of Morphine wasted with [place], TRN

## 2023-02-14 ENCOUNTER — Emergency Department (HOSPITAL_COMMUNITY)
Admission: EM | Admit: 2023-02-14 | Discharge: 2023-02-14 | Disposition: A | Discharge status: Home / Self Care | Payer: BC Managed Care – PPO | Attending: Emergency Medicine | Admitting: Emergency Medicine

## 2023-02-14 ENCOUNTER — Emergency Department (HOSPITAL_COMMUNITY): Payer: BC Managed Care – PPO

## 2023-02-14 ENCOUNTER — Encounter (HOSPITAL_COMMUNITY): Payer: Self-pay

## 2023-02-14 ENCOUNTER — Other Ambulatory Visit: Payer: Self-pay

## 2023-02-14 DIAGNOSIS — Z794 Long term (current) use of insulin: Secondary | ICD-10-CM | POA: Insufficient documentation

## 2023-02-14 DIAGNOSIS — R519 Headache, unspecified: Secondary | ICD-10-CM | POA: Insufficient documentation

## 2023-02-14 DIAGNOSIS — E119 Type 2 diabetes mellitus without complications: Secondary | ICD-10-CM | POA: Diagnosis not present

## 2023-02-14 DIAGNOSIS — R531 Weakness: Secondary | ICD-10-CM | POA: Insufficient documentation

## 2023-02-14 DIAGNOSIS — R112 Nausea with vomiting, unspecified: Secondary | ICD-10-CM | POA: Diagnosis not present

## 2023-02-14 DIAGNOSIS — Z7984 Long term (current) use of oral hypoglycemic drugs: Secondary | ICD-10-CM | POA: Insufficient documentation

## 2023-02-14 DIAGNOSIS — I10 Essential (primary) hypertension: Secondary | ICD-10-CM

## 2023-02-14 DIAGNOSIS — R55 Syncope and collapse: Secondary | ICD-10-CM | POA: Insufficient documentation

## 2023-02-14 DIAGNOSIS — R002 Palpitations: Secondary | ICD-10-CM | POA: Insufficient documentation

## 2023-02-14 LAB — CBC
HCT: 50.5 % (ref 39.0–52.0)
Hemoglobin: 15.6 g/dL (ref 13.0–17.0)
MCH: 24.9 pg — ABNORMAL LOW (ref 26.0–34.0)
MCHC: 30.9 g/dL (ref 30.0–36.0)
MCV: 80.5 fL (ref 80.0–100.0)
Platelets: 301 10*3/uL (ref 150–400)
RBC: 6.27 MIL/uL — ABNORMAL HIGH (ref 4.22–5.81)
RDW: 13.1 % (ref 11.5–15.5)
WBC: 6.9 10*3/uL (ref 4.0–10.5)
nRBC: 0 % (ref 0.0–0.2)

## 2023-02-14 LAB — LIPASE, BLOOD: Lipase: 33 U/L (ref 11–51)

## 2023-02-14 LAB — BASIC METABOLIC PANEL
Anion gap: 13 (ref 5–15)
BUN: 11 mg/dL (ref 6–20)
CO2: 28 mmol/L (ref 22–32)
Calcium: 9.6 mg/dL (ref 8.9–10.3)
Chloride: 96 mmol/L — ABNORMAL LOW (ref 98–111)
Creatinine, Ser: 1.14 mg/dL (ref 0.61–1.24)
GFR, Estimated: >60 mL/min (ref >60)
Glucose, Bld: 331 mg/dL — ABNORMAL HIGH (ref 70–99)
Potassium: 3.8 mmol/L (ref 3.5–5.1)
Sodium: 137 mmol/L (ref 135–145)

## 2023-02-14 LAB — TROPONIN I (HIGH SENSITIVITY)
Troponin I (High Sensitivity): 8 ng/L (ref <18)
Troponin I (High Sensitivity): 8 ng/L (ref <18)

## 2023-02-14 MED ORDER — KETOROLAC TROMETHAMINE 30 MG/ML IJ SOLN
15.0000 mg | Freq: Once | INTRAMUSCULAR | Status: AC
  Administered 2023-02-14: 15 mg via INTRAVENOUS
  Filled 2023-02-14: qty 1

## 2023-02-14 MED ORDER — DIPHENHYDRAMINE HCL 50 MG/ML IJ SOLN
12.5000 mg | Freq: Once | INTRAMUSCULAR | Status: AC
  Administered 2023-02-14: 12.5 mg via INTRAVENOUS
  Filled 2023-02-14: qty 1

## 2023-02-14 MED ORDER — METOPROLOL SUCCINATE ER 50 MG PO TB24
50.0000 mg | ORAL_TABLET | Freq: Every day | ORAL | 2 refills | Status: DC
Start: 2023-02-14 — End: 2023-09-08

## 2023-02-14 MED ORDER — METOCLOPRAMIDE HCL 5 MG/ML IJ SOLN
5.0000 mg | Freq: Once | INTRAMUSCULAR | Status: AC
  Administered 2023-02-14: 5 mg via INTRAVENOUS
  Filled 2023-02-14: qty 2

## 2023-02-14 MED ORDER — SPIRONOLACTONE 25 MG PO TABS
25.0000 mg | ORAL_TABLET | Freq: Every day | ORAL | 2 refills | Status: DC
Start: 2023-02-14 — End: 2023-08-11

## 2023-02-14 MED ORDER — SODIUM CHLORIDE 0.9 % IV BOLUS
1000.0000 mL | Freq: Once | INTRAVENOUS | Status: AC
  Administered 2023-02-14: 1000 mL via INTRAVENOUS

## 2023-02-14 MED ORDER — LOSARTAN POTASSIUM 25 MG PO TABS: 25.0000 mg | ORAL_TABLET | Freq: Every day | ORAL | 2 refills | Status: DC

## 2023-02-14 NOTE — ED Notes (Signed)
Patient transported to CT 

## 2023-02-14 NOTE — ED Triage Notes (Signed)
Pt reports has been having nausea/vomiting/headache and palpitations since Thursday. Denies CP/SOB/fever/cough.

## 2023-02-14 NOTE — Discharge Instructions (Signed)

## 2023-02-14 NOTE — ED Provider Notes (Signed)
[place] EMERGENCY DEPARTMENT AT [facility] Provider Note   CSN: [id number] Arrival date & time: 02/14/23  1348     History  Chief Complaint  Patient presents with   Nausea   Weakness   Headache   Palpitations   Emesis    Rodney Kane is a 47 y.o. male with a past medical history of acute CVA, history of DVT, patient attributes this to COVID 19 infection.  History of nonischemic cardiomyopathy, type 2 diabetes, paroxysmal atrial fibrillation.  Patient reports that 2 days ago he got out of the shower when he had sudden onset racing heart, near syncope and severe sudden onset right-sided headache with photophobia and nausea.  He had a soap associated hyperesthesia of the right side of his face.  He states that he took 800 mg of ibuprofen without any improvement in his symptoms.  Patient states he was able to get to sleep but woke up around 130 with decreased headache.  He states he had a mild headache on the right side yesterday however just prior to arrival had severe onset of cyst of headache again.  He has a history of malignant hypertension.  He has associated nausea and photophobia.  He denies eye pain vision change unilateral weakness.  He has a history of aphasia and difficulty getting out his words from previous CVA.  Weakness Associated symptoms: headaches and vomiting   Headache Associated symptoms: vomiting and weakness   Palpitations Associated symptoms: vomiting and weakness   Emesis Associated symptoms: headaches        Home Medications Prior to Admission medications   Medication Sig Start Date End Date Taking? Authorizing Provider  atorvastatin (LIPITOR) 40 MG tablet Take 1 tablet (40 mg total) by mouth daily. Patient not taking: Reported on 07/30/2022 02/19/22 05/20/22  [place], Eric J, DO  blood glucose meter kit and supplies Dispense based on patient and insurance preference. Use up to four times daily as directed. (FOR ICD-10 E10.9, E11.9).  02/19/22   [place], Alvira Philips, DO  glipiZIDE (GLUCOTROL) 5 MG tablet Take 1 tablet (5 mg total) by mouth 2 (two) times daily. 02/19/22 07/30/22  [place], Alvira Philips, DO  insulin NPH-regular Human (NOVOLIN 70/30 RELION) (70-30) 100 UNIT/ML injection Inject 10 Units into the skin 2 (two) times daily with a meal. Patient not taking: Reported on 07/30/2022 02/19/22 05/20/22  [place], Alvira Philips, DO  Insulin Syringes, Disposable, U-100 0.3 ML MISC Use as directed with insulin vial 02/19/22   [place], Eric J, DO  losartan (COZAAR) 25 MG tablet Take 1 tablet (25 mg total) by mouth daily. Patient not taking: Reported on 07/30/2022 02/20/22 05/21/22  [place], Eric J, DO  metoprolol succinate (TOPROL-XL) 50 MG 24 hr tablet Take 1 tablet (50 mg total) by mouth daily. Take with or immediately following a meal. Patient not taking: Reported on 07/30/2022 02/19/22 05/20/22  [place], Alvira Philips, DO  oxyCODONE (OXY IR/ROXICODONE) 5 MG immediate release tablet Take 1 tablet (5 mg total) by mouth every 6 (six) hours as needed for breakthrough pain. Patient not taking: Reported on 07/30/2022 02/19/22   Jacinto Halim, PA-C  spironolactone (ALDACTONE) 25 MG tablet Take 1 tablet (25 mg total) by mouth daily. Patient not taking: Reported on 07/30/2022 02/19/22 05/20/22  [place], Eric J, DO  torsemide (DEMADEX) 20 MG tablet Take 1 tablet (20 mg total) by mouth daily. Patient not taking: Reported on 07/30/2022 02/19/22 05/20/22  [place], Eric J, DO      Allergies  Patient has no known allergies.    Review of Systems   Review of Systems  Cardiovascular:  Positive for palpitations.  Gastrointestinal:  Positive for vomiting.  Neurological:  Positive for weakness and headaches.    Physical Exam Updated Vital Signs BP (!) 152/115 (BP Location: Right Arm)   Pulse (!) 103   Temp 99 F (37.2 C) (Oral)   Resp 18   Ht 6\' 1"  (1.854 m)   Wt (!) 139.7 kg   SpO2 100%   BMI 40.64 kg/m  Physical Exam Vitals and nursing note reviewed.  Constitutional:       General: He is not in acute distress.    Appearance: He is well-developed. He is not diaphoretic.  HENT:     Head: Normocephalic and atraumatic.     Mouth/Throat:     Mouth: Oropharynx is clear and moist.  Eyes:     General: No scleral icterus.    Extraocular Movements: EOM normal.     Conjunctiva/sclera: Conjunctivae normal.     Pupils: Pupils are equal, round, and reactive to light.     Comments: No horizontal, vertical or rotational nystagmus  Neck:     Comments: Full active and passive ROM without pain No midline or paraspinal tenderness No nuchal rigidity or meningeal signs Cardiovascular:     Rate and Rhythm: Normal rate and regular rhythm.  Pulmonary:     Effort: Pulmonary effort is normal. No respiratory distress.     Breath sounds: Normal breath sounds. No wheezing or rales.  Abdominal:     General: Bowel sounds are normal.     Palpations: Abdomen is soft.     Tenderness: There is no abdominal tenderness. There is no guarding or rebound.  Musculoskeletal:        General: Normal range of motion.     Cervical back: Normal range of motion and neck supple.  Lymphadenopathy:     Cervical: No cervical adenopathy.  Skin:    General: Skin is warm and dry.     Findings: No rash.  Neurological:     Mental Status: He is alert and oriented to person, place, and time.     Cranial Nerves: No cranial nerve deficit.     Motor: No abnormal muscle tone.     Coordination: Coordination normal.     Deep Tendon Reflexes: Reflexes are normal and symmetric.     Comments: Mental Status:  Alert, oriented, thought content appropriate. Speech stunted with word finding difficulty.  Able to follow 2 step commands without difficulty.  Cranial Nerves:  II:  Peripheral visual fields grossly normal, pupils equal, round, reactive to light III,IV, VI: ptosis not present, extra-ocular motions intact bilaterally  V,VII: smile symmetric, facial light touch sensation equal VIII: hearing grossly  normal bilaterally  IX,X: midline uvula rise  XI: bilateral shoulder shrug equal and strong XII: midline tongue extension  Motor:  5/5 in upper and lower extremities bilaterally including strong and equal grip strength and dorsiflexion/plantar flexion Sensory: Pinprick and light touch normal in all extremities.  Deep Tendon Reflexes: 2+ and symmetric  Cerebellar: normal finger-to-nose with bilateral upper extremities Gait: normal gait and balance CV: distal pulses palpable throughout   Psychiatric:        Mood and Affect: Mood and affect normal.        Behavior: Behavior normal.        Thought Content: Thought content normal.        Judgment: Judgment normal.  ED Results / Procedures / Treatments   Labs (all labs ordered are listed, but only abnormal results are displayed) Labs Reviewed  BASIC METABOLIC PANEL - Abnormal; Notable for the following components:      Result Value   Chloride 96 (*)    Glucose, Bld 331 (*)    All other components within normal limits  CBC - Abnormal; Notable for the following components:   RBC 6.27 (*)    MCH 24.9 (*)    All other components within normal limits  LIPASE, BLOOD  TROPONIN I (HIGH SENSITIVITY)  TROPONIN I (HIGH SENSITIVITY)    EKG None  Radiology DG Chest 2 View  Result Date: 02/14/2023 CLINICAL DATA:  Palpitations. EXAM: CHEST - 2 VIEW COMPARISON:  February 17, 2022. FINDINGS: The heart size and mediastinal contours are within normal limits. Both lungs are clear. The visualized skeletal structures are unremarkable. IMPRESSION: No active cardiopulmonary disease. Electronically Signed   By: Lupita Raider M.D.   On: 02/14/2023 14:47    Procedures Procedures    Medications Ordered in ED Medications - No data to display  ED Course/ Medical Decision Making/ A&P Clinical Course as of 02/14/23 1954  Sat Feb 14, 2023  1951 Basic metabolic panel(!) [AH]  1951 CBC(!) [AH]  1951 Lipase, blood [AH]  1951 Troponin I (High  Sensitivity) [AH]  1951 DG Chest 2 View [AH]  1952 CT Head Wo Contrast I personally visualized and interpreted the images using our PACS system. Acute findings include:  No acute findings on cT head or cxr  [AH]    Clinical Course User Index [AH] Arthor Captain, PA-C                             Medical Decision Making Rodney Kane presents with headache Given the large differential diagnosis for [facility], the decision making in this case is of high complexity.   Patient notably hypertensive- noncompliant with medications.  I gave: Medications ketorolac (TORADOL) 30 MG/ML injection 15 mg (has no administration in time range) sodium chloride 0.9 % bolus 1,000 mL (0 mLs Intravenous Stopped 02/14/23 1951) metoCLOPramide (REGLAN) injection 5 mg (5 mg Intravenous Given 02/14/23 1621) diphenhydrAMINE (BENADRYL) injection 12.5 mg (12.5 mg Intravenous Given 02/14/23 1625) After reevaluation, headache is resolved. After evaluating all of the data points in this case, the presentation of Rodney Kane is NOT consistent with skull fracture, meningitis/encephalitis, SAH/sentinel bleed, Intracranial Hemorrhage (ICH) (subdural/epidural), acute obstructive hydrocephalus, space occupying lesions, CVA, CO Poisoning, Basilar/vertebral artery dissection, preeclampsia, cerebral venous thrombosis, hypertensive emergency, temporal Arteritis, Idiopathic Intracranial Hypertension (pseudotumor cerebri).  Strict return and follow-up precautions have been given by me personally or by detailed written instructions verbalized by nursing staff using the teach back method to patient/family/caregiver.  Data Reviewed/Counseling: I have reviewed the patient's vital signs, nursing notes, and other relevant tests/information. I had a detailed discussion regarding the historical points, exam findings, and any diagnostic results supporting the discharge diagnosis. I also discussed the need for  outpatient follow-up and the need to return to the ED if symptoms worsen or if there are any questions or concerns that arise at hom    Amount and/or Complexity of Data Reviewed Radiology: ordered.  Risk Prescription drug management.           Final Clinical Impression(s) / ED Diagnoses Final diagnoses:  None    Rx / DC Orders ED Discharge Orders  None         Arthor Captain, PA-C 02/14/23 1956    Derwood Kaplan, MD 02/15/23 2042

## 2023-08-11 ENCOUNTER — Other Ambulatory Visit: Payer: Self-pay

## 2023-08-11 ENCOUNTER — Emergency Department (HOSPITAL_COMMUNITY)
Admission: EM | Admit: 2023-08-11 | Discharge: 2023-08-11 | Disposition: A | Discharge status: Home / Self Care | Payer: BLUE CROSS/BLUE SHIELD | Attending: Emergency Medicine | Admitting: Emergency Medicine

## 2023-08-11 ENCOUNTER — Encounter (HOSPITAL_COMMUNITY): Payer: Self-pay

## 2023-08-11 DIAGNOSIS — L03116 Cellulitis of left lower limb: Secondary | ICD-10-CM | POA: Insufficient documentation

## 2023-08-11 DIAGNOSIS — R739 Hyperglycemia, unspecified: Secondary | ICD-10-CM

## 2023-08-11 DIAGNOSIS — E11628 Type 2 diabetes mellitus with other skin complications: Secondary | ICD-10-CM | POA: Insufficient documentation

## 2023-08-11 DIAGNOSIS — E1165 Type 2 diabetes mellitus with hyperglycemia: Secondary | ICD-10-CM | POA: Diagnosis not present

## 2023-08-11 DIAGNOSIS — I10 Essential (primary) hypertension: Secondary | ICD-10-CM | POA: Diagnosis not present

## 2023-08-11 LAB — COMPREHENSIVE METABOLIC PANEL
ALT: 19 U/L (ref 0–44)
AST: 19 U/L (ref 15–41)
Albumin: 3.5 g/dL (ref 3.5–5.0)
Alkaline Phosphatase: 118 U/L (ref 38–126)
Anion gap: 10 (ref 5–15)
BUN: 13 mg/dL (ref 6–20)
CO2: 26 mmol/L (ref 22–32)
Calcium: 9 mg/dL (ref 8.9–10.3)
Chloride: 94 mmol/L — ABNORMAL LOW (ref 98–111)
Creatinine, Ser: 1.47 mg/dL — ABNORMAL HIGH (ref 0.61–1.24)
GFR, Estimated: 58 mL/min — ABNORMAL LOW (ref >60)
Glucose, Bld: 590 mg/dL (ref 70–99)
Potassium: 5.4 mmol/L — ABNORMAL HIGH (ref 3.5–5.1)
Sodium: 130 mmol/L — ABNORMAL LOW (ref 135–145)
Total Bilirubin: 0.5 mg/dL (ref 0.0–1.2)
Total Protein: 7.5 g/dL (ref 6.5–8.1)

## 2023-08-11 LAB — CBC WITH DIFFERENTIAL/PLATELET
Abs Immature Granulocytes: 0.02 10*3/uL (ref 0.00–0.07)
Basophils Absolute: 0.1 10*3/uL (ref 0.0–0.1)
Basophils Relative: 1 %
Eosinophils Absolute: 0.1 10*3/uL (ref 0.0–0.5)
Eosinophils Relative: 2 %
HCT: 48.6 % (ref 39.0–52.0)
Hemoglobin: 15.1 g/dL (ref 13.0–17.0)
Immature Granulocytes: 0 %
Lymphocytes Relative: 41 %
Lymphs Abs: 3.4 10*3/uL (ref 0.7–4.0)
MCH: 25.5 pg — ABNORMAL LOW (ref 26.0–34.0)
MCHC: 31.1 g/dL (ref 30.0–36.0)
MCV: 82 fL (ref 80.0–100.0)
Monocytes Absolute: 0.8 10*3/uL (ref 0.1–1.0)
Monocytes Relative: 10 %
Neutro Abs: 3.9 10*3/uL (ref 1.7–7.7)
Neutrophils Relative %: 46 %
Platelets: 268 10*3/uL (ref 150–400)
RBC: 5.93 MIL/uL — ABNORMAL HIGH (ref 4.22–5.81)
RDW: 12.9 % (ref 11.5–15.5)
WBC: 8.3 10*3/uL (ref 4.0–10.5)
nRBC: 0 % (ref 0.0–0.2)

## 2023-08-11 LAB — I-STAT VENOUS BLOOD GAS, ED
Acid-Base Excess: 4 mmol/L — ABNORMAL HIGH (ref 0.0–2.0)
Bicarbonate: 30.8 mmol/L — ABNORMAL HIGH (ref 20.0–28.0)
Calcium, Ion: 1.14 mmol/L — ABNORMAL LOW (ref 1.15–1.40)
HCT: 48 % (ref 39.0–52.0)
Hemoglobin: 16.3 g/dL (ref 13.0–17.0)
O2 Saturation: 38 %
Potassium: 4.7 mmol/L (ref 3.5–5.1)
Sodium: 135 mmol/L (ref 135–145)
TCO2: 32 mmol/L (ref 22–32)
pCO2, Ven: 50.5 mm[Hg] (ref 44–60)
pH, Ven: 7.393 (ref 7.25–7.43)
pO2, Ven: 23 mm[Hg] — CL (ref 32–45)

## 2023-08-11 LAB — URINALYSIS, W/ REFLEX TO CULTURE (INFECTION SUSPECTED)
Bacteria, UA: NONE SEEN
Bilirubin Urine: NEGATIVE
Glucose, UA: >=500 mg/dL — AB
Hgb urine dipstick: NEGATIVE
Ketones, ur: NEGATIVE mg/dL
Leukocytes,Ua: NEGATIVE
Nitrite: NEGATIVE
Protein, ur: NEGATIVE mg/dL
Specific Gravity, Urine: 1.028 (ref 1.005–1.030)
pH: 5 (ref 5.0–8.0)

## 2023-08-11 LAB — CBG MONITORING, ED
Glucose-Capillary: 547 mg/dL (ref 70–99)
Glucose-Capillary: 412 mg/dL — ABNORMAL HIGH (ref 70–99)
Glucose-Capillary: 393 mg/dL — ABNORMAL HIGH (ref 70–99)
Glucose-Capillary: 137 mg/dL — ABNORMAL HIGH (ref 70–99)

## 2023-08-11 LAB — BASIC METABOLIC PANEL
Anion gap: 9 (ref 5–15)
BUN: 13 mg/dL (ref 6–20)
CO2: 27 mmol/L (ref 22–32)
Calcium: 9.1 mg/dL (ref 8.9–10.3)
Chloride: 98 mmol/L (ref 98–111)
Creatinine, Ser: 1.36 mg/dL — ABNORMAL HIGH (ref 0.61–1.24)
GFR, Estimated: >60 mL/min (ref >60)
Glucose, Bld: 452 mg/dL — ABNORMAL HIGH (ref 70–99)
Potassium: 4.8 mmol/L (ref 3.5–5.1)
Sodium: 134 mmol/L — ABNORMAL LOW (ref 135–145)

## 2023-08-11 LAB — I-STAT CG4 LACTIC ACID, ED
Lactic Acid, Venous: 2.5 mmol/L (ref 0.5–1.9)
Lactic Acid, Venous: 1.8 mmol/L (ref 0.5–1.9)

## 2023-08-11 MED ORDER — INSULIN REGULAR(HUMAN) IN NACL 100-0.9 UT/100ML-% IV SOLN
INTRAVENOUS | Status: DC
  Administered 2023-08-11: 15 [IU]/h via INTRAVENOUS
  Filled 2023-08-11: qty 100

## 2023-08-11 MED ORDER — DEXTROSE 50 % IV SOLN: 0.0000 mL | INTRAVENOUS | Status: DC | PRN

## 2023-08-11 MED ORDER — DEXTROSE IN LACTATED RINGERS 5 % IV SOLN: INTRAVENOUS | Status: DC

## 2023-08-11 MED ORDER — LOSARTAN POTASSIUM 25 MG PO TABS: 25.0000 mg | ORAL_TABLET | Freq: Every day | ORAL | 0 refills | Status: DC

## 2023-08-11 MED ORDER — LACTATED RINGERS IV SOLN: INTRAVENOUS | Status: DC

## 2023-08-11 MED ORDER — DOXYCYCLINE HYCLATE 100 MG PO CAPS: 100.0000 mg | ORAL_CAPSULE | Freq: Two times a day (BID) | ORAL | 0 refills | Status: DC

## 2023-08-11 MED ORDER — GLIPIZIDE 5 MG PO TABS: 5.0000 mg | ORAL_TABLET | Freq: Every day | ORAL | 0 refills | Status: DC

## 2023-08-11 NOTE — ED Triage Notes (Signed)
Pt arrives with c/o left leg wound that is swollen and painful. Per pt, he has had this wound for about 2-3 weeks. Pt endorses drainage from wound. Pt is diabetic and reports missing his meds sometimes. Pt denies fevers, chills, CP, or SOB.

## 2023-08-11 NOTE — ED Notes (Signed)
Dressed left leg wound with 4x4 gauze and coban

## 2023-08-11 NOTE — ED Provider Notes (Signed)
[place] EMERGENCY DEPARTMENT AT [facility] Provider Note   CSN: [id number] Arrival date & time: 08/11/23  0200     History Chief Complaint  Patient presents with   Wound Infection    Rodney Kane is a 48 y.o. male with history of type 2 diabetes, hypertension, stroke with deficits of speech presents to the emergency department today for evaluation of wound to the lateral aspect of the left leg.  He thinks is been going on for the past month but is starting to drain for the past 2 weeks.  Reports occasionally clear fluid, blood, and had some purulence.  He reports initially started as a black bump that grown into the wound.  He notes having previously on his other leg that he resolved with wound care.  He denies any fevers.  He has been noncompliant with his medications for at least over a year.  He denies any prior injury to the leg.  HPI     Home Medications Prior to Admission medications   Medication Sig Start Date End Date Taking? Authorizing Provider  losartan (COZAAR) 25 MG tablet Take 1 tablet (25 mg total) by mouth daily. Patient not taking: Reported on 08/11/2023 02/14/23 05/15/23  Arthor Captain, PA-C  metoprolol succinate (TOPROL-XL) 50 MG 24 hr tablet Take 1 tablet (50 mg total) by mouth daily. Take with or immediately following a meal. Patient not taking: Reported on 08/11/2023 02/14/23 05/15/23  Arthor Captain, PA-C  spironolactone (ALDACTONE) 25 MG tablet Take 1 tablet (25 mg total) by mouth daily. Patient not taking: Reported on 08/11/2023 02/14/23 05/15/23  Arthor Captain, PA-C      Allergies    Patient has no known allergies.    Review of Systems   Review of Systems  Constitutional:  Negative for chills and fever.  Skin:  Positive for wound.    Physical Exam Updated Vital Signs BP (!) 157/98 (BP Location: Right Arm)   Pulse 95   Temp 98.5 F (36.9 C) (Oral)   Resp 18   Ht 6\' 1"  (1.854 m)   Wt (!) 155.6 kg   SpO2 96%   BMI 45.25  kg/m  Physical Exam Vitals and nursing note reviewed.  Constitutional:      General: He is not in acute distress.    Appearance: He is not ill-appearing or toxic-appearing.  Eyes:     General: No scleral icterus. Pulmonary:     Effort: Pulmonary effort is normal. No respiratory distress.  Musculoskeletal:     Right lower leg: No edema.     Left lower leg: No edema.     Comments: Wounds seen to the lateral aspect of the left lower leg.  Does not have any induration or fluctuance.  Does have a small amount of serosanguineous discharge.  Scaling surrounding.  No significant erythema or warmth.  Compartments are soft.  No red streaking noted.  Skin:    General: Skin is warm and dry.  Neurological:     Mental Status: He is alert.        ED Results / Procedures / Treatments   Labs (all labs ordered are listed, but only abnormal results are displayed) Labs Reviewed  COMPREHENSIVE METABOLIC PANEL - Abnormal; Notable for the following components:      Result Value   Sodium 130 (*)    Potassium 5.4 (*)    Chloride 94 (*)    Glucose, Bld 590 (*)    Creatinine, Ser 1.47 (*)  GFR, Estimated 58 (*)    All other components within normal limits  CBC WITH DIFFERENTIAL/PLATELET - Abnormal; Notable for the following components:   RBC 5.93 (*)    MCH 25.5 (*)    All other components within normal limits  URINALYSIS, W/ REFLEX TO CULTURE (INFECTION SUSPECTED) - Abnormal; Notable for the following components:   Color, Urine STRAW (*)    Glucose, UA >=500 (*)    All other components within normal limits  I-STAT CG4 LACTIC ACID, ED - Abnormal; Notable for the following components:   Lactic Acid, Venous 2.5 (*)    All other components within normal limits  CBG MONITORING, ED - Abnormal; Notable for the following components:   Glucose-Capillary 547 (*)    All other components within normal limits  I-STAT VENOUS BLOOD GAS, ED - Abnormal; Notable for the following components:   pO2, Ven 23  (*)    Bicarbonate 30.8 (*)    Acid-Base Excess 4.0 (*)    Calcium, Ion 1.14 (*)    All other components within normal limits  CBG MONITORING, ED - Abnormal; Notable for the following components:   Glucose-Capillary 412 (*)    All other components within normal limits  BASIC METABOLIC PANEL  URINALYSIS, ROUTINE W REFLEX MICROSCOPIC  I-STAT CG4 LACTIC ACID, ED    EKG None  Radiology No results found.  Procedures Procedures   Medications Ordered in ED Medications  insulin regular, human (MYXREDLIN) 100 units/ 100 mL infusion (15 Units/hr Intravenous New Bag/Given 08/11/23 0606)  lactated ringers infusion ( Intravenous New Bag/Given 08/11/23 0605)  dextrose 5 % in lactated ringers infusion (0 mLs Intravenous Hold 08/11/23 0555)  dextrose 50 % solution 0-50 mL (has no administration in time range)    ED Course/ Medical Decision Making/ A&P    Medical Decision Making Amount and/or Complexity of Data Reviewed Labs: ordered.  Risk Prescription drug management.   48 y.o. male presents to the ER for evaluation of wound. Differential diagnosis includes but is not limited to cellulitis, osteomyelitis, abscess. Vital signs elevated blood pressure otherwise unremarkable. Physical exam as noted above.   Workup initiated in triage.  I independently reviewed and interpreted the patient's labs.  CMP showed mildly elevated potassium 5.4 with glucose of 590 and pseudohyponatremia at 130.  Patient creatinine 1.47 and 4094.  No other electrolyte or LFT abnormality.  Question some possible hemolyzed specimen and ordered a BMP to check the potassium.  Lactic acid initially elevated 2.5 however is now 1.8.  Patient is nontoxic-appearing at this time.  Any acute distress.  pH is 7.39.  CBC without leukocytosis or anemia.  Urinalysis shows probable urine with greater than 5 and glucose present but no ketones.  Patient has a normal bicarb with no ketones and normal pH.  This is hyperglycemia, no DKA.   Does not meet any criteria for HHS.  My attending assessed the patient at bedside.  Does not know any additional imaging.  These patient to be discharged home with antibiotics and wound care.  Patient's creatinine and glucose are improving with IV fluid hydration and insulin.  Patient will need to be discharged when glucose reached lower than 300.  He has a follow-up appointment with his PCP within the month.  I have refilled 2 of his medications, the glipizide and losartan.  Have also started him on doxycycline for the wound.  I have put in an ambulatory referral to wound care.  Discussed with him wound care with Dial soap  and water.  Wound up to oncoming shift to follow-up on blood glucose levels.  6:45 AM Care of D[facility] [place]  transferred to PA  Upstill at the end of my shift as the patient will require reassessment once labs/imaging have resulted. Patient presentation, ED course, and plan of care discussed with review of all pertinent labs and imaging. Please see his/her note for further details regarding further ED course and disposition. Plan at time of handoff is patient can be discharged home when blood sugar is < 300. This may be altered or completely changed at the discretion of the oncoming team pending results of further workup.  Portions of this report may have been transcribed using voice recognition software. Every effort was made to ensure accuracy; however, inadvertent computerized transcription errors may be present.   I discussed this case with my attending physician who cosigned this note including patient's presenting symptoms, physical exam, and planned diagnostics and interventions. Attending physician stated agreement with plan or made changes to plan which were implemented.   Attending physician assessed patient at bedside.  Final Clinical Impression(s) / ED Diagnoses Final diagnoses:  Hyperglycemia  Cellulitis of left lower extremity    Rx / DC Orders ED  Discharge Orders          Ordered    losartan (COZAAR) 25 MG tablet  Daily        08/11/23 0643    glipiZIDE (GLUCOTROL) 5 MG tablet  Daily before breakfast        08/11/23 0643    doxycycline (VIBRAMYCIN) 100 MG capsule  2 times daily        08/11/23 0645    Ambulatory referral to Wound Clinic        08/11/23 0656              Achille Rich, PA-C 08/11/23 8469    Zadie Rhine, MD 08/11/23 [phone]

## 2023-08-11 NOTE — ED Notes (Signed)
Per PA-Riley once the patient's blood sugar is <300 the insulin drip can be stopped and the patient can be discharged.

## 2023-08-11 NOTE — Discharge Instructions (Addendum)
You were seen in the ER today for evaluation of your symptoms.  I have refilled one of your diabetes medications and one of your blood pressure medications.  Please take as prescribed.  Please make sure you follow-up with your primary care provider.  For your wound, I am referring you to the wound center.  Please make sure you clean the wound daily with Dial soap and water.  Please stop using alcohol and peroxide on the area.  I am also prescribing an antibiotic to take twice a day for the next 7 days for this.  It is important you are checking your blood sugars at home.  If you have any concerns, new or worsening symptoms, please return to the nearest emergency room for evaluation.  Contact a doctor if: You have a fever. You do not start to get better after 1-2 days of treatment. Your bone or joint under the infected area starts to hurt after the skin has healed. Your infection comes back in the same area or another area. Signs of this may include: You have a swollen bump in the area. Your red area gets larger, turns dark in color, or hurts more. You have more fluid coming from the wound. Pus or a bad smell develops in your infected area. You have more pain. You feel sick and have muscle aches and weakness. You develop vomiting or watery poop that will not go away. Get help right away if: You see red streaks coming from the area. You notice the skin turns purple or black and falls off. These symptoms may be an emergency. Get help right away. Call 911. Do not wait to see if the symptoms will go away. Do not drive yourself to the hospital.

## 2023-08-11 NOTE — ED Provider Notes (Signed)
  Physical Exam  BP (!) 133/95   Pulse 99   Temp 98.5 F (36.9 C) (Oral)   Resp 14   Ht 6\' 1"  (1.854 m)   Wt (!) 155.6 kg   SpO2 98%   BMI 45.25 kg/m   Physical Exam Constitutional:      Appearance: Ill appearance: ckline.     Procedures  Procedures  ED Course / MDM    Medical Decision Making Amount and/or Complexity of Data Reviewed Labs: ordered.  Risk Prescription drug management.   Off meds Wound to left shin, nonpurulent Glucose 500's On hyperglycemia treatment protocol Anticipate discharge when CBG below 300 as target  08:17 - CBG 137. VSS. Can discharge per plan of previous treatment team.      Elpidio Anis, PA-C 08/11/23 0818    Zadie Rhine, MD 08/12/23 [phone]

## 2023-08-11 NOTE — ED Notes (Signed)
BGL 393

## 2023-08-21 ENCOUNTER — Encounter: Payer: Self-pay | Admitting: Nurse Practitioner

## 2023-08-21 ENCOUNTER — Ambulatory Visit (INDEPENDENT_AMBULATORY_CARE_PROVIDER_SITE_OTHER): Payer: BLUE CROSS/BLUE SHIELD | Admitting: Nurse Practitioner

## 2023-08-21 VITALS — BP 140/98 | HR 107 | Temp 97.5°F | Wt 317.6 lb

## 2023-08-21 DIAGNOSIS — I10 Essential (primary) hypertension: Secondary | ICD-10-CM | POA: Diagnosis not present

## 2023-08-21 DIAGNOSIS — E785 Hyperlipidemia, unspecified: Secondary | ICD-10-CM | POA: Diagnosis not present

## 2023-08-21 DIAGNOSIS — E118 Type 2 diabetes mellitus with unspecified complications: Secondary | ICD-10-CM

## 2023-08-21 DIAGNOSIS — Z7984 Long term (current) use of oral hypoglycemic drugs: Secondary | ICD-10-CM

## 2023-08-21 DIAGNOSIS — R7309 Other abnormal glucose: Secondary | ICD-10-CM

## 2023-08-21 LAB — POCT GLYCOSYLATED HEMOGLOBIN (HGB A1C): Hemoglobin A1C: 15 % — AB (ref 4.0–5.6)

## 2023-08-21 MED ORDER — LANCETS MISC. MISC: 1.0000 | Freq: Three times a day (TID) | 0 refills | Status: AC

## 2023-08-21 MED ORDER — LANCET DEVICE MISC: 1.0000 | Freq: Three times a day (TID) | 0 refills | Status: AC

## 2023-08-21 MED ORDER — BLOOD GLUCOSE TEST VI STRP: 1.0000 | ORAL_STRIP | Freq: Three times a day (TID) | 0 refills | Status: AC

## 2023-08-21 MED ORDER — BLOOD GLUCOSE MONITORING SUPPL DEVI: 1.0000 | Freq: Three times a day (TID) | 0 refills | Status: AC

## 2023-08-21 MED ORDER — MUPIROCIN 2 % EX OINT: 1.0000 | TOPICAL_OINTMENT | Freq: Two times a day (BID) | CUTANEOUS | 0 refills | Status: DC

## 2023-08-21 MED ORDER — METFORMIN HCL ER 500 MG PO TB24: 500.0000 mg | ORAL_TABLET | Freq: Two times a day (BID) | ORAL | 2 refills | Status: DC

## 2023-08-21 NOTE — Patient Instructions (Addendum)
1. Type 2 diabetes mellitus with complication, without long-term current use of insulin (HCC) (Primary)  - POCT glycosylated hemoglobin (Hb A1C) - AMB Referral [facility]BCI Care Management - metFORMIN (GLUCOPHAGE-XR) 500 MG 24 hr tablet; Take 1 tablet (500 mg total) by mouth 2 (two) times daily with a meal.  Dispense: 60 tablet; Refill: 2  2. Hemoglobin A1C greater than 9%, indicating poor diabetic control  - POCT glycosylated hemoglobin (Hb A1C) - AMB Referral [facility]BCI Care Management - Blood Glucose Monitoring Suppl DEVI; 1 each by Does not apply route in the morning, at noon, and at bedtime. May substitute to any manufacturer covered by patient's insurance.  Dispense: 1 each; Refill: 0 - Glucose Blood (BLOOD GLUCOSE TEST STRIPS) STRP; 1 each by In Vitro route in the morning, at noon, and at bedtime. May substitute to any manufacturer covered by patient's insurance.  Dispense: 100 strip; Refill: 0 - Lancet Device MISC; 1 each by Does not apply route in the morning, at noon, and at bedtime. May substitute to any manufacturer covered by patient's insurance.  Dispense: 1 each; Refill: 0 - Lancets Misc. MISC; 1 each by Does not apply route in the morning, at noon, and at bedtime. May substitute to any manufacturer covered by patient's insurance.  Dispense: 100 each; Refill: 0 - metFORMIN (GLUCOPHAGE-XR) 500 MG 24 hr tablet; Take 1 tablet (500 mg total) by mouth 2 (two) times daily with a meal.  Dispense: 60 tablet; Refill: 2  3. Hyperlipidemia, unspecified hyperlipidemia type  - AMB Referral [facility]BCI Care Management  4. Essential hypertension  - AMB Referral [facility]BCI Care Management  Follow up:  Follow up in 4 weeks

## 2023-08-21 NOTE — Progress Notes (Signed)
Subjective   Patient ID: Rodney Kane, male    DOB: 1976-01-20, 48 y.o.   MRN: [id number]  Chief Complaint  Patient presents with   Diabetes    Follow up   sore on leg    Left lower leg has a open wound on it and he wanted some medication for the burning.    Problems with errection     Patient stated that he is having problems getting hard, patient thinks it is a disconnect from his brain.    Referring provider: Early Osmond, NP  Rodney Kane is a 48 y.o. male with Past Medical History: 06/2020: Coronavirus infection 06/2020: COVID No date: Diabetes mellitus without complication (HCC) 12/2019: Hemoglobin A1C greater than 9%, indicating poor diabetic  control 12/2019: Hyperglycemia 12/2019: Hyperlipidemia No date: Hypertension 12/2019: Hypertensive urgency 12/2019: Noncompliance with medication regimen 12/2016: Nonischemic (hypertensive) dilated cardiomyopathy (HCC)     Comment:  EF has been 30 to 35%, recently lower. No date: Stroke [facility]) 12/2019: Urine ketones 12/2019: Vitamin D deficiency   HPI  Patient presents today for diabetes and hypertension follow-up. Patient was recently in the hospital Upon entering hospital blood sugars were over 500 - most recent was 137 on glipizide.   Patient's blood pressure was elevated on arrival to the office today.  It did come down slightly after he rested for a little while in office today.  Patient has been noncompliant with his medications and did not take medications today.  We will call in refills today. Will start metformin.  Hemoglobin A1c is over 15 today in office. Denies f/c/s, n/v/d, hemoptysis, PND, chest pain or edema.   Will check labs at next visit including lipid panel  No Known Allergies  Immunization History  Administered Date(s) Administered   Tdap 12/01/2017    Tobacco History: Social History   Tobacco Use  Smoking Status Never  Smokeless Tobacco Never   Counseling given: Not  Answered   Outpatient Encounter Medications as of 08/21/2023  Medication Sig   Blood Glucose Monitoring Suppl DEVI 1 each by Does not apply route in the morning, at noon, and at bedtime. May substitute to any manufacturer covered by patient's insurance.   doxycycline (VIBRAMYCIN) 100 MG capsule Take 1 capsule (100 mg total) by mouth 2 (two) times daily.   glipiZIDE (GLUCOTROL) 5 MG tablet Take 1 tablet (5 mg total) by mouth daily before breakfast.   Glucose Blood (BLOOD GLUCOSE TEST STRIPS) STRP 1 each by In Vitro route in the morning, at noon, and at bedtime. May substitute to any manufacturer covered by patient's insurance.   Lancet Device MISC 1 each by Does not apply route in the morning, at noon, and at bedtime. May substitute to any manufacturer covered by patient's insurance.   Lancets Misc. MISC 1 each by Does not apply route in the morning, at noon, and at bedtime. May substitute to any manufacturer covered by patient's insurance.   losartan (COZAAR) 25 MG tablet Take 1 tablet (25 mg total) by mouth daily.   metFORMIN (GLUCOPHAGE-XR) 500 MG 24 hr tablet Take 1 tablet (500 mg total) by mouth 2 (two) times daily with a meal.   metoprolol succinate (TOPROL-XL) 50 MG 24 hr tablet Take 1 tablet (50 mg total) by mouth daily. Take with or immediately following a meal.   mupirocin ointment (BACTROBAN) 2 % Apply 1 Application topically 2 (two) times daily.   No facility-administered encounter medications on file as of 08/21/2023.    Review  of Systems  Review of Systems  Constitutional: Negative.   HENT: Negative.    Cardiovascular: Negative.   Gastrointestinal: Negative.   Allergic/Immunologic: Negative.   Neurological: Negative.   Psychiatric/Behavioral: Negative.       Objective:   BP (!) 140/98   Pulse (!) 107   Temp (!) 97.5 F (36.4 C)   Wt (!) 317 lb 9.6 oz (144.1 kg)   SpO2 98%   BMI 41.90 kg/m   Wt Readings from Last 5 Encounters:  08/21/23 (!) 317 lb 9.6 oz (144.1  kg)  08/11/23 (!) 343 lb (155.6 kg)  02/14/23 (!) 308 lb (139.7 kg)  07/30/22 (!) 324 lb (147 kg)  02/19/22 (!) 328 lb 3.2 oz (148.9 kg)     Physical Exam Vitals and nursing note reviewed.  Constitutional:      General: He is not in acute distress.    Appearance: He is well-developed.  Cardiovascular:     Rate and Rhythm: Normal rate and regular rhythm.  Pulmonary:     Effort: Pulmonary effort is normal.     Breath sounds: Normal breath sounds.  Skin:    General: Skin is warm and dry.  Neurological:     Mental Status: He is alert and oriented to person, place, and time.       Assessment & Plan:   Type 2 diabetes mellitus with complication, without long-term current use of insulin (HCC) -     POCT glycosylated hemoglobin (Hb A1C) -     AMB Referral VBCI Care Management -     metFORMIN HCl ER; Take 1 tablet (500 mg total) by mouth 2 (two) times daily with a meal.  Dispense: 60 tablet; Refill: 2  Hemoglobin A1C greater than 9%, indicating poor diabetic control -     POCT glycosylated hemoglobin (Hb A1C) -     AMB Referral VBCI Care Management -     Blood Glucose Monitoring Suppl; 1 each by Does not apply route in the morning, at noon, and at bedtime. May substitute to any manufacturer covered by patient's insurance.  Dispense: 1 each; Refill: 0 -     Blood Glucose Test; 1 each by In Vitro route in the morning, at noon, and at bedtime. May substitute to any manufacturer covered by patient's insurance.  Dispense: 100 strip; Refill: 0 -     Lancet Device; 1 each by Does not apply route in the morning, at noon, and at bedtime. May substitute to any manufacturer covered by patient's insurance.  Dispense: 1 each; Refill: 0 -     Lancets Misc.; 1 each by Does not apply route in the morning, at noon, and at bedtime. May substitute to any manufacturer covered by patient's insurance.  Dispense: 100 each; Refill: 0 -     metFORMIN HCl ER; Take 1 tablet (500 mg total) by mouth 2 (two) times  daily with a meal.  Dispense: 60 tablet; Refill: 2  Hyperlipidemia, unspecified hyperlipidemia type -     AMB Referral VBCI Care Management  Essential hypertension -     AMB Referral VBCI Care Management  Other orders -     Mupirocin; Apply 1 Application topically 2 (two) times daily.  Dispense: 22 g; Refill: 0     Return in about 4 weeks (around 09/18/2023).   Ivonne Andrew, NP 08/21/2023

## 2023-08-25 ENCOUNTER — Telehealth: Payer: Self-pay

## 2023-08-25 ENCOUNTER — Other Ambulatory Visit: Payer: Self-pay

## 2023-08-25 DIAGNOSIS — Z5941 Food insecurity: Secondary | ICD-10-CM

## 2023-08-25 DIAGNOSIS — Z79899 Other long term (current) drug therapy: Secondary | ICD-10-CM

## 2023-08-25 NOTE — Progress Notes (Incomplete)
08/25/2023 Name: Rodney Kane MRN: [id number] DOB: 1976/07/05  Chief Complaint  Patient presents with  . Diabetes  . Hypertension    Rodney Kane is a 48 y.o. year old male who presented for a telephone visit.     Rodney Kane is a 48 year old male with a past medical history significant for, but not limited to, diabetes, hypertension, and CVA (2021). He was recently seen in the ED for diabetes 2/2 to hyperglycemia and cellulitis (completed course of Abx). They were referred to the pharmacist by their PCP for assistance in managing diabetes and hypertension.   Subjective:  Care Team: Primary Care Provider: Early Osmond, NP ; Next Scheduled Visit: 09/21/2023 Wound Care 08/28/2023  Medication Access/Adherence  Current Pharmacy:  [facility] [address] [place] [address] Phone: [phone] Fax: [phone]  [facility] [address] [place] - [address] [place] [address] Phone: [phone] Fax: [phone]   Patient reports affordability concerns with their medications: No  Patient reports access/transportation concerns to their pharmacy: No  He previously had transportation issues that impacted medication adherence, but this is no longr an issue as he fixed his car Patient reports adherence concerns with their medications:  No     Diabetes:  Current medications:  -Glipizide 5 mg daily before breakfast - denies missing any dose since he picked up on 08/11/2023 -Metformin 500 mg twice daily - just picked up yesterday; tolerating dose well so far and has not missed doses - Novolog PRN (self-medicated using old prescription)  Medications tried in the past: Lantus, Levemir, Tradjenta, Novolog, Novolin 70/30, Januvia  Current glucose readings:  Typically -"mid 200s to 300s" Fasting "mid 100s" Using glucose meter; testing 1-2 times daily (earlier during medication review he  stated twice daily)    Patient denies hypoglycemic s/sx including dizziness, shakiness, sweating.  Patient reports hyperglycemic symptoms including polyuria, polydipsia, polyphagia, nocturia, neuropathy (occasionally in feet), blurred vision.  -The patient has an upcoming appointment with the eye doctor due to concerns about vision, as he reports that he "cannot see." He later mentions using a fast-acting insulin, which he believes to be Novolog, referring to it as "the orange" insulin when he experiences symptoms of hyperglycemia. It is important to note that this is an old prescription that the patient uses to manage his insulin. Upon further clarification, he located the insulin and confirmed that he uses a vial of Novolog, administering 10 units once daily as needed for symptoms of hyperglycemia. The original prescription was written for 5 units three times daily. The patient states that he rarely uses Novolog PRN.  We were not able to discuss diet and exercise today in detail. He is working to reduce drinking regular sodas and is trying to drink more water and diet/sugar free options of sodas.  He would like to work on improving is diet now that he has insurance, but notes intermittent food scarcity concerns.    Current medication access support: N/A  Hypertension:  Current medications: Losartan, Lasix 40 mg daily PRN (self-medicated using old prescription) Medications previously tried: hydralazine, losartan (stopped due to gap in care)  Patient has a validated, automated, upper arm home BP cuff, but he doesn't use. He also has a watch to check his blood pressure (he could not give the name of his smart watch). Current blood pressure readings readings: unknown by patient  Patient reports hypotensive s/sx including rare dizziness, lightheadedness.  Patient denies hypertensive symptoms including headache, chest pain, shortness of breath  Current meal patterns: Did not discuss  today  Current physical activity: Dis not discuss today   Objective:  Lab Results  Component Value Date   HGBA1C 15.0 (A) 08/21/2023    Lab Results  Component Value Date   CREATININE 1.36 (H) 08/11/2023   BUN 13 08/11/2023   NA 135 08/11/2023   K 4.7 08/11/2023   CL 98 08/11/2023   CO2 27 08/11/2023    Lab Results  Component Value Date   CHOL 132 05/30/2021   HDL 50 05/30/2021   LDLCALC 71 05/30/2021   TRIG 49 05/30/2021   CHOLHDL 2.6 05/30/2021    Medications Reviewed Today     Reviewed by Katha Cabal, RPH (Pharmacist) on 08/25/23 at 1317  Med List Status: <None>   Medication Order Taking? Sig Documenting Provider Last Dose Status Informant  Blood Glucose Monitoring Suppl DEVI [id number] Yes 1 each by Does not apply route in the morning, at noon, and at bedtime. May substitute to any manufacturer covered by patient's insurance. Ivonne Andrew, NP Taking Active            Med Note Para March, [facility] R   Tue Aug 25, 2023  1:04 PM) * did not pick up the new glucometer  furosemide (LASIX) 40 MG tablet [id number] Yes Take 40 mg by mouth daily. Prescribed: Corine Shelter, [place] [provider] Taking Active Self           Med Note Para March, [facility] R   Tue Aug 25, 2023  1:17 PM) Patient is taking an old prescription to manage his blood pressure. Prescribed 09/05/2021  glipiZIDE (GLUCOTROL) 5 MG tablet [id number] Yes Take 1 tablet (5 mg total) by mouth daily before breakfast. Achille Rich, PA-C Taking Active            Med Note Para March, [facility] R   Tue Aug 25, 2023  1:06 PM) Patient reports he picked up medication  Glucose Blood (BLOOD GLUCOSE TEST STRIPS) STRP [id number] Yes 1 each by In Vitro route in the morning, at noon, and at bedtime. May substitute to any manufacturer covered by patient's insurance. Ivonne Andrew, NP Taking Active   Lancet Device MISC [id number] Yes 1 each by Does not apply route in the morning, at noon, and at bedtime. May substitute to any manufacturer  covered by patient's insurance. Ivonne Andrew, NP Taking Active            Med Note Para March, [facility] R   Tue Aug 25, 2023  1:10 PM) Using about twice daily  Lancets Misc. MISC [id number] No 1 each by Does not apply route in the morning, at noon, and at bedtime. May substitute to any manufacturer covered by patient's insurance.  Patient not taking: Reported on 08/25/2023   Ivonne Andrew, NP Not Taking Active            Med Note Para March, [facility] R   Tue Aug 25, 2023  1:10 PM) duplicate  losartan (COZAAR) 25 MG tablet [id number] Yes Take 1 tablet (25 mg total) by mouth daily. Achille Rich, PA-C Taking Active   metFORMIN (GLUCOPHAGE-XR) 500 MG 24 hr tablet [id number] Yes Take 1 tablet (500 mg total) by mouth 2 (two) times daily with a meal. Ivonne Andrew, NP Taking Active            Med Note Para March, [facility] R   Tue Aug 25, 2023  1:11 PM)  Per patient he picked up yesterday  metoprolol succinate (TOPROL-XL) 50 MG 24 hr tablet [id number]  Take 1 tablet (50 mg total) by mouth daily. Take with or immediately following a meal. Arthor Captain, PA-C  Expired 08/21/23 2359   mupirocin ointment (BACTROBAN) 2 % [id number] Yes Apply 1 Application topically 2 (two) times daily. Ivonne Andrew, NP Taking Active               Assessment/Plan:   - d/c lasix self dose  - check BP once daily is wha patient reports he could do; encouraged to check 2-3 times per week  - he has BP device - told him to use it and bring to clinic nex appt (will call before appointment) to make sure accurate   put in the phone - needs statin (lipid panel needed) - need to call pharmacy   Compliance with appoints and meds discussed the importance  He asked for a referral for urologist - since he can't have an erection   Rodney Kane has uncontrolled diabetes and recently seen in the ED for significantly elevated blood glucose. Insulin was not started upon discharge but a prescription for glipizide was renewed. Patient was  recently seen by PCP in which metformin was started and tolerated well. Per discussion with patient, he  inconsistently checks his blood glucose yet reports a functioning glucometer at home. Therefore, has not picked up new prescription for glucometer PCP sent to his pharmacy, including lancets or test strips. I verified with the pharmacy that the current test strips match his glucometer. He is also managing his diabetes with an old prescription of Novolog. I discussed my concerns about his diabetes management and compliance with appointments and medications. He inquired about a referral for erectile dysfunction, which I explained is related to his uncontrolled diabetes. He has a history of stroke and diabetes, and although he was previously on atorvastatin, he is not currently on a statin.  Diabetes: - Currently uncontrolled - Reviewed long term cardiovascular and renal outcomes of uncontrolled blood sugar - Reviewed goal A1c, goal fasting, and goal 2 hour post prandial glucose - Reviewed the importance of medication compliance - Will need to review dietary modifications during the next follow up encounter - Will need assistance with a social worker referral for food scarcity assessment for possible help - Will need to review lifestyle modifications during the next counter - Recommend to check glucose, given history of noncompliance, check fasting blood glucose daily and alternating each day with checking 2 hour post prandial glucose for lunch and dinner if he knows he cannot truly check his blood glucose three times per day. Log data in phone or small journal.  - Help patient transfer test trips to preferred [facility] on [address] and he plans to pick up medication.  - Recommend starting Latus 15 units once daily (0.1 units/kg/day given A1c and weight) until seen in person by pharmacist to help assist with titration of basal insulin (increase by 1-2 units every 3 days to reach FBG  monitoring for hypoglycemic events).  -May consider stopping glipizide once Lantus is started or when Lantus dose is increased to prevent hypoglycemia. STOP Novolog PRN today. - May consider statin therapy initiation and check lipid panel in 4 weeks - May consider microurine albumin     Hypertension: Currently uncontrolled. The bulk of the encounter was spent on diabetes management related to referral. Per Dr. Tiajuana Amass, patient was filling a 30-days supply of losartan three  times last year.  - Educated the importance of taking only currently prescribed medication. - Reviewed long term cardiovascular complications Recommended to check home blood pressure once daily (goal), but to start 2-3 times per week checking blood pressure and to log in phone.  -Recommended patient bring home blood pressure device to clinic - Recommend to continue losartan and STOP PRN lasix. Bring ALL medications to the clinic when he sees the pharmacist in person.   - May consider repeat potassium and Scr as it was recently elevated and it is unclear how much/often the patient took lasix    Follow Up Plan: ***  F/u in 1 week   Time spent: 1 hr 27  min  Send catie chart

## 2023-08-25 NOTE — Progress Notes (Addendum)
 08/25/2023 Name: Rodney Kane MRN: [id number] DOB: 03-14-1976  Chief Complaint  Patient presents with   Diabetes   Hypertension    Rodney Kane is a 48 y.o. year old male who presented for a telephone visit.     Rodney Kane is a 48 year old male with a past medical history significant for, but not limited to, diabetes, hypertension, and CVA (2021). He was recently seen in the ED for diabetes 2/2 to hyperglycemia and cellulitis (completed course of Abx). They were referred to the pharmacist by their PCP for assistance in managing diabetes and hypertension.   Subjective:  Care Team: Primary Care Provider: Myrna Sailors, NP ; Next Scheduled Visit: 09/21/2023 Wound Care 08/28/2023  Medication Access/Adherence  Current Pharmacy:  [facility] [address] [place] [address] Phone: [phone] Fax: [phone]  [facility] [address] [place] - [address] [place] [address] Phone: [phone] Fax: [phone]   Patient reports affordability concerns with their medications: No  Patient reports access/transportation concerns to their pharmacy: No  He previously had transportation issues that impacted medication adherence, but this is no longer an issue as he fixed his car Patient reports adherence concerns with their medications:  No     Diabetes:  Current medications:  -Glipizide  5 mg daily before breakfast - denies missing any dose since he picked up on 08/11/2023 -Metformin  500 mg twice daily - just picked up yesterday; tolerating dose well so far and has not missed doses - Novolog  PRN (self-medicated using old prescription)  Medications tried in the past: Lantus , Levemir , Tradjenta , Novolog , Novolin  70/30, Januvia   Current glucose readings:  Typically -mid 200s to 300s Fasting mid 100s Using glucose meter; testing 1-2 times daily (earlier during medication review he stated  twice daily)    Patient denies hypoglycemic s/sx including dizziness, shakiness, sweating.   Patient reports hyperglycemic symptoms including polyuria, polydipsia, polyphagia, nocturia, neuropathy (occasionally in feet), blurred vision.  -The patient has an upcoming appointment with the eye doctor due to concerns about vision, as he reports that he cannot see. He later mentions using a fast-acting insulin , which he believes to be Novolog , referring to it as the orange insulin  when he experiences symptoms of hyperglycemia. It is important to note that this is an old prescription that the patient uses to manage his insulin . Upon further clarification, he located the insulin  and confirmed that he uses a vial of Novolog , administering 10 units once daily as needed for symptoms of hyperglycemia. The original prescription was written for 5 units three times daily. The patient states that he rarely uses Novolog  PRN.  We were not able to discuss diet and exercise today in detail. He is working to reduce drinking regular sodas and is trying to drink more water and diet/sugar free options of sodas.  He would like to work on improving is diet now that he has insurance, but notes intermittent food scarcity concerns.    Current medication access support: N/A  Hypertension:  Current medications: Losartan , Lasix  40 mg daily PRN (self-medicated using old prescription) Medications previously tried: hydralazine , losartan  (stopped due to gap in care)  Patient has a validated, automated, upper arm home BP cuff, but he doesn't use. He also has a watch to check his blood pressure (he could not give the name of his smart watch). Current blood pressure readings readings: unknown by patient  Patient reports hypotensive s/sx including rare dizziness,  lightheadedness.  Patient denies hypertensive symptoms including headache, chest pain, shortness of breath  Current meal patterns: Did not discuss today  Current  physical activity: Did not discuss today   Objective:  Lab Results  Component Value Date   HGBA1C 15.0 (A) 08/21/2023    Lab Results  Component Value Date   CREATININE 1.36 (H) 08/11/2023   BUN 13 08/11/2023   NA 135 08/11/2023   K 4.7 08/11/2023   CL 98 08/11/2023   CO2 27 08/11/2023    Lab Results  Component Value Date   CHOL 132 05/30/2021   HDL 50 05/30/2021   LDLCALC 71 05/30/2021   TRIG 49 05/30/2021   CHOLHDL 2.6 05/30/2021    Medications Reviewed Today     Reviewed by Cleatus Dorcas SAUNDERS, RPH (Pharmacist) on 08/25/23 at 1317  Med List Status: <None>   Medication Order Taking? Sig Documenting Provider Last Dose Status Informant  Blood Glucose Monitoring Suppl DEVI [id number] Yes 1 each by Does not apply route in the morning, at noon, and at bedtime. May substitute to any manufacturer covered by patient's insurance. [name], NP Taking Active            Med Note MAYER, [facility] R   Tue Aug 25, 2023  1:04 PM) * did not pick up the new glucometer  furosemide  (LASIX ) 40 MG tablet [id number] Yes Take 40 mg by mouth daily. Prescribed: Herlene Canterbury, [place] [provider] Taking Active Self           Med Note MAYER, [facility] R   Tue Aug 25, 2023  1:17 PM) Patient is taking an old prescription to manage his blood pressure. Prescribed 09/05/2021  glipiZIDE  (GLUCOTROL ) 5 MG tablet [id number] Yes Take 1 tablet (5 mg total) by mouth daily before breakfast. Bernis Ernst, PA-C Taking Active            Med Note MAYER, [facility] R   Tue Aug 25, 2023  1:06 PM) Patient reports he picked up medication  Glucose Blood (BLOOD GLUCOSE TEST STRIPS) STRP [id number] Yes 1 each by In Vitro route in the morning, at noon, and at bedtime. May substitute to any manufacturer covered by patient's insurance. [name], NP Taking Active   Lancet Device MISC [id number] Yes 1 each by Does not apply route in the morning, at noon, and at bedtime. May substitute to any manufacturer covered by  patient's insurance. [name], NP Taking Active            Med Note MAYER, [facility] R   Tue Aug 25, 2023  1:10 PM) Using about twice daily  Lancets Misc. MISC [id number] No 1 each by Does not apply route in the morning, at noon, and at bedtime. May substitute to any manufacturer covered by patient's insurance.  Patient not taking: Reported on 08/25/2023   [name], NP Not Taking Active            Med Note MAYER, [facility] R   Tue Aug 25, 2023  1:10 PM) duplicate  losartan  (COZAAR ) 25 MG tablet [id number] Yes Take 1 tablet (25 mg total) by mouth daily. Bernis Ernst, PA-C Taking Active   metFORMIN  (GLUCOPHAGE -XR) 500 MG 24 hr tablet [id number] Yes Take 1 tablet (500 mg total) by mouth 2 (two) times daily with a meal. [name], NP Taking Active            Med Note MAYER, [facility] R   Tue Aug 25, 2023  1:11 PM) Per patient he picked up yesterday  metoprolol  succinate (TOPROL -XL) 50 MG 24 hr tablet [id number]  Take 1 tablet (50 mg total) by mouth daily. Take with or immediately following a meal. Arloa Chroman, PA-C  Expired 08/21/23 2359   mupirocin  ointment (BACTROBAN ) 2 % [id number] Yes Apply 1 Application topically 2 (two) times daily. [name], NP Taking Active               Assessment/Plan:   Rodney Kane has uncontrolled diabetes and recently seen in the ED for significantly elevated blood glucose. Insulin  was not started upon discharge but a prescription for glipizide  was renewed. Patient was recently seen by PCP in which metformin  was started and tolerated well. Per discussion with patient, he  inconsistently checks his blood glucose yet reports a functioning glucometer at home. Therefore, has not picked up new prescription for glucometer PCP sent to his pharmacy, including lancets or test strips. I verified with the pharmacy that the current test strips match his glucometer. He is also managing his diabetes with an old prescription of Novolog . I discussed my concerns  about his diabetes management and compliance with appointments and medications. He inquired about a referral for erectile dysfunction, which I explained is related to his uncontrolled diabetes. He has a history of stroke and diabetes, and although he was previously on atorvastatin , he is not currently on a statin.   Diabetes: - Currently uncontrolled. A1c goal <7%, FBG <130, 2-h PPBG <180.  - Reviewed long term cardiovascular and renal outcomes of uncontrolled blood sugar - Reviewed goal A1c, goal fasting, and goal 2 hour post prandial glucose - Reviewed the importance of medication compliance - Will need to review dietary modifications during the next follow up encounter - Will need assistance with a social worker referral for food scarcity assessment for possible help - Will need to review lifestyle modifications during the next counter - Recommend to check glucose, given history of noncompliance, check fasting blood glucose daily and alternating each day with checking 2 hour post prandial glucose for lunch and dinner if he knows he cannot truly check his blood glucose three times per day. Log data in phone or small journal.  - Help patient transfer test trips to preferred [facility] on [address] and he plans to pick up medication.  - Recommend starting Latus 15 units once daily (0.1 units/kg/day given A1c and weight) until seen in person by pharmacist to help assist with titration of basal insulin  (increase by 1-2 units every 3 days to reach FBG monitoring for hypoglycemic events).  -May consider stopping glipizide  when appropriate and stable on basal insulin  to avoid hypoglycemia. STOP Novolog  PRN today. Additionally, asked patient to discontinue using any other medications that have not been recently prescribed by a provider such as old prescriptions not on his active medication list.  - May consider statin therapy initiation and check lipid panel in 4 weeks - May consider  microurine albumin     Hypertension: Currently uncontrolled. Goal: <130/80 mmHg. The bulk of the encounter was spent on diabetes management related to referral. Per Dr. Annemarie, patient was filling a 30-days supply of losartan  three times last year.  - Educated the importance of taking only currently prescribed medication. - Reviewed long term cardiovascular complications -Recommended to check home blood pressure once daily (goal), but to start 2-3 times per week checking blood pressure and to log in phone.  - Recommended patient bring home blood pressure device to  clinic - Recommend to continue losartan  and STOP PRN lasix . Bring ALL medications to the clinic when he sees the pharmacist in person.   - May consider repeat potassium and Scr as it was recently elevated and it is unclear how much/often the patient took lasix     Follow Up Plan:  - Patient will need follow up in 1 week if not seen in person by a pharmacist in clinic - Will send referral for social worker for food insecurity - Will need to discuss diet and exercise, review BP technique, and additional education on goals for diabetes  Time spent: 1 hr 27  min We do not have our new processes in place yet for orders.  Thank you for allowing pharmacy to be a part of this patient's care. Dorcas Solian, PharmD  Clinical Pharmacist Cell: [phone]

## 2023-08-25 NOTE — Progress Notes (Signed)
   08/25/2023  Patient ID: Rodney Kane, male   DOB: 1976-05-04, 48 y.o.   MRN: [id number]  Attempted to contact patient for medication management/review. Left HIPAA compliant message for patient to return my call at their convenience.   First attempt for patient outreach. Will follow up with patient in 1-2 business days.  Thank you for allowing pharmacy to be a part of this patient's care.  Dorcas Solian, PharmD Clinical Pharmacist Cell: [phone]

## 2023-08-27 ENCOUNTER — Telehealth: Payer: Self-pay | Admitting: *Deleted

## 2023-08-27 NOTE — Progress Notes (Signed)
 Complex Care Management Note Care Guide Note  08/27/2023 Name: Rodney Kane MRN: [id number] DOB: [birth year]  [name] is a 48 y.o. year old male who is a primary care patient of Myrna Sailors, NP . The community resource team was consulted for assistance with Food Insecurity  SDOH screenings and interventions completed:  Yes  Social Drivers of Health From This Encounter   Food Insecurity: Food Insecurity Present (08/27/2023)   Hunger Vital Sign    Worried About Running Out of Food in the Last Year: Sometimes true    Ran Out of Food in the Last Year: Sometimes true  Transportation Needs: No Transportation Needs (08/27/2023)   PRAPARE - Administrator, Civil Service (Medical): No    Lack of Transportation (Non-Medical): No  Utilities: Not At Risk (08/27/2023)   Utilities    Threatened with loss of utilities: No    SDOH Interventions Today    Flowsheet Row Most Recent Value  SDOH Interventions   Food Insecurity Interventions --  [Will get patient food stamp application and also send food banks resources]  Utilities Interventions AMB Referral, Other (Comment)  [says he is all caught up on utilites mention LIEAP]        Care guide performed the following interventions: Patient provided with information about care guide support team and interviewed to confirm resource needs.  Follow Up Plan:  Care guide will follow up with patient by phone over the next days   will call back on Monday   Encounter Outcome:  Patient Visit Completed Asencion Gell  [facility] HealthPopulation Health Care Guide  Direct [phone] Fax:[phone] Website: [email or web address]

## 2023-08-28 ENCOUNTER — Encounter (HOSPITAL_BASED_OUTPATIENT_CLINIC_OR_DEPARTMENT_OTHER): Payer: BLUE CROSS/BLUE SHIELD | Attending: General Surgery | Admitting: Internal Medicine

## 2023-08-31 ENCOUNTER — Telehealth: Payer: Self-pay | Admitting: *Deleted

## 2023-08-31 NOTE — Progress Notes (Signed)
 Complex Care Management Note Care Guide Note  08/31/2023 Name: Arrion Nevills MRN: [id number] DOB: 1975/10/24  Vera Gip Surti is a 48 y.o. year old male who is a primary care patient of Bronwen Canon, NP . The community resource team was consulted for assistance with Food Insecurity  SDOH screenings and interventions completed:  No        Care guide performed the following interventions:  Patient has not called to find out satus will calla gain tomorrow am .  Follow Up Plan:  Care guide will follow up with patient by phone over the next day  Encounter Outcome:  Patient Visit Completed  Gust Leghorn  [facility] HealthPopulation Health Care Guide  Direct Dial:[phone] Fax:[phone] Website: [email or web address]

## 2023-09-02 ENCOUNTER — Other Ambulatory Visit (HOSPITAL_COMMUNITY): Payer: Self-pay

## 2023-09-02 ENCOUNTER — Telehealth: Payer: Self-pay | Admitting: *Deleted

## 2023-09-02 ENCOUNTER — Emergency Department (HOSPITAL_BASED_OUTPATIENT_CLINIC_OR_DEPARTMENT_OTHER)
Admission: EM | Admit: 2023-09-02 | Discharge: 2023-09-02 | Disposition: A | Discharge status: Home / Self Care | Payer: Medicaid Other | Attending: Emergency Medicine | Admitting: Emergency Medicine

## 2023-09-02 ENCOUNTER — Encounter (HOSPITAL_BASED_OUTPATIENT_CLINIC_OR_DEPARTMENT_OTHER): Payer: Self-pay | Admitting: Emergency Medicine

## 2023-09-02 ENCOUNTER — Emergency Department (HOSPITAL_BASED_OUTPATIENT_CLINIC_OR_DEPARTMENT_OTHER): Payer: Medicaid Other

## 2023-09-02 ENCOUNTER — Other Ambulatory Visit: Payer: Self-pay | Admitting: Nurse Practitioner

## 2023-09-02 DIAGNOSIS — L97329 Non-pressure chronic ulcer of left ankle with unspecified severity: Secondary | ICD-10-CM | POA: Insufficient documentation

## 2023-09-02 DIAGNOSIS — L539 Erythematous condition, unspecified: Secondary | ICD-10-CM | POA: Diagnosis present

## 2023-09-02 DIAGNOSIS — Z7984 Long term (current) use of oral hypoglycemic drugs: Secondary | ICD-10-CM | POA: Insufficient documentation

## 2023-09-02 DIAGNOSIS — M7989 Other specified soft tissue disorders: Secondary | ICD-10-CM | POA: Diagnosis not present

## 2023-09-02 DIAGNOSIS — E11622 Type 2 diabetes mellitus with other skin ulcer: Secondary | ICD-10-CM | POA: Diagnosis not present

## 2023-09-02 DIAGNOSIS — E11621 Type 2 diabetes mellitus with foot ulcer: Secondary | ICD-10-CM | POA: Diagnosis not present

## 2023-09-02 DIAGNOSIS — L97929 Non-pressure chronic ulcer of unspecified part of left lower leg with unspecified severity: Secondary | ICD-10-CM | POA: Diagnosis not present

## 2023-09-02 DIAGNOSIS — Z794 Long term (current) use of insulin: Secondary | ICD-10-CM | POA: Insufficient documentation

## 2023-09-02 LAB — CBC WITH DIFFERENTIAL/PLATELET
Abs Immature Granulocytes: 0.03 10*3/uL (ref 0.00–0.07)
Basophils Absolute: 0.1 10*3/uL (ref 0.0–0.1)
Basophils Relative: 1 %
Eosinophils Absolute: 0.1 10*3/uL (ref 0.0–0.5)
Eosinophils Relative: 2 %
HCT: 47.8 % (ref 39.0–52.0)
Hemoglobin: 14.9 g/dL (ref 13.0–17.0)
Immature Granulocytes: 0 %
Lymphocytes Relative: 35 %
Lymphs Abs: 2.5 10*3/uL (ref 0.7–4.0)
MCH: 25.2 pg — ABNORMAL LOW (ref 26.0–34.0)
MCHC: 31.2 g/dL (ref 30.0–36.0)
MCV: 80.9 fL (ref 80.0–100.0)
Monocytes Absolute: 0.7 10*3/uL (ref 0.1–1.0)
Monocytes Relative: 10 %
Neutro Abs: 3.7 10*3/uL (ref 1.7–7.7)
Neutrophils Relative %: 52 %
Platelets: 261 10*3/uL (ref 150–400)
RBC: 5.91 MIL/uL — ABNORMAL HIGH (ref 4.22–5.81)
RDW: 13 % (ref 11.5–15.5)
WBC: 7.1 10*3/uL (ref 4.0–10.5)
nRBC: 0 % (ref 0.0–0.2)

## 2023-09-02 LAB — BASIC METABOLIC PANEL
Anion gap: 10 (ref 5–15)
BUN: 12 mg/dL (ref 6–20)
CO2: 27 mmol/L (ref 22–32)
Calcium: 9.8 mg/dL (ref 8.9–10.3)
Chloride: 100 mmol/L (ref 98–111)
Creatinine, Ser: 1.18 mg/dL (ref 0.61–1.24)
GFR, Estimated: >60 mL/min (ref >60)
Glucose, Bld: 309 mg/dL — ABNORMAL HIGH (ref 70–99)
Potassium: 4.5 mmol/L (ref 3.5–5.1)
Sodium: 137 mmol/L (ref 135–145)

## 2023-09-02 MED ORDER — CEPHALEXIN 250 MG PO CAPS: 500.0000 mg | ORAL_CAPSULE | Freq: Once | ORAL | Status: DC

## 2023-09-02 MED ORDER — SULFAMETHOXAZOLE-TRIMETHOPRIM 800-160 MG PO TABS: 1.0000 | ORAL_TABLET | Freq: Two times a day (BID) | ORAL | 0 refills | Status: AC

## 2023-09-02 MED ORDER — SULFAMETHOXAZOLE-TRIMETHOPRIM 800-160 MG PO TABS
1.0000 | ORAL_TABLET | Freq: Once | ORAL | Status: AC
Start: 2023-09-02 — End: 2023-09-02
  Administered 2023-09-02: 1 via ORAL
  Filled 2023-09-02: qty 1

## 2023-09-02 MED ORDER — MUPIROCIN 2 % EX OINT
1.0000 | TOPICAL_OINTMENT | Freq: Two times a day (BID) | CUTANEOUS | 0 refills | Status: DC
  Filled 2023-09-02: qty 22, 11d supply, fill #0

## 2023-09-02 MED ORDER — CEPHALEXIN 500 MG PO CAPS: 500.0000 mg | ORAL_CAPSULE | Freq: Four times a day (QID) | ORAL | 0 refills | Status: DC

## 2023-09-02 MED ORDER — CEFTRIAXONE SODIUM 500 MG IJ SOLR
500.0000 mg | Freq: Once | INTRAMUSCULAR | Status: AC
  Administered 2023-09-02: 500 mg via INTRAMUSCULAR
  Filled 2023-09-02: qty 500

## 2023-09-02 MED ORDER — SULFAMETHOXAZOLE-TRIMETHOPRIM 800-160 MG PO TABS: 1.0000 | ORAL_TABLET | Freq: Once | ORAL | Status: DC

## 2023-09-02 MED ORDER — DEXTROSE 5 % IV SOLN: 1500.0000 mg | Freq: Once | INTRAVENOUS | Status: DC

## 2023-09-02 NOTE — Discharge Instructions (Addendum)
Can take Keflex 4 times per day for the next 7 days.  You can take Bactrim once in the morning once in the evening for the next 10 days.  Your symptoms should begin to improve over the next 2 to 3 days.  Keep the area clean, apply an antibiotic ointment.  Please follow-up with your PCP within 1 week to have the wound looked at.  Come to the emergency room if you develop fever, worsening pain in your leg, or noticed increased swelling or pus coming from the wound.

## 2023-09-02 NOTE — Telephone Encounter (Signed)
Copied from CRM [id number]. Topic: Clinical - Medication Refill >> Sep 02, 2023  1:25 PM Payton Doughty wrote: Most Recent Primary Care Visit:  Provider: Ivonne Andrew  Department: [facility]  Visit Type: OFFICE VISIT  Date: 08/21/2023  Medication: mupirocin ointment (BACTROBAN) 2 %   Has the patient contacted their pharmacy? No (Agent: If yes, when and what did the pharmacy advise?)  Is this the correct pharmacy for this prescription? Yes If no, delete pharmacy and type the correct one.  This is the patient's preferred pharmacy:  [facility] [address] [place] - [address] [place] [address] Phone: [phone] Fax: [phone]   Has the prescription been filled recently? Yes It is for his wound. Is the patient out of the medication? Yes  Has the patient been seen for an appointment in the last year OR does the patient have an upcoming appointment? Yes  Can we respond through MyChart? Yes  Agent: Please be advised that Rx refills may take up to 3 business days. We ask that you follow-up with your pharmacy.

## 2023-09-02 NOTE — ED Provider Triage Note (Signed)
Emergency Medicine Provider Triage Evaluation Note  Rodney Kane , a 48 y.o. male  was evaluated in triage.  Pt complains of wound.  Review of Systems  Positive:  Negative:   Physical Exam  There were no vitals taken for this visit. Gen:   Awake, no distress   Resp:  Normal effort  MSK:   Moves extremities without difficulty  Other:    Medical Decision Making  Medically screening exam initiated at 6:10 PM.  Appropriate orders placed.  Rodney Kane was informed that the remainder of the evaluation will be completed by another provider, this initial triage assessment does not replace that evaluation, and the importance of remaining in the ED until their evaluation is complete.  Wound on left lateral lower leg x3-4 months. Looks infected. No fever, nausea, vomiting, diarrhea.   Dorthy Cooler, [place] 09/02/23 1811

## 2023-09-02 NOTE — ED Triage Notes (Signed)
Wound on left leg- lateral left leg-several months- HX DB II.

## 2023-09-02 NOTE — Progress Notes (Signed)
Complex Care Management Note Care Guide Note  09/02/2023 Name: Dyshon Philbin MRN: [id number] DOB: 01-29-1976  Janee Morn Mutch is a 47 y.o. year old male who is a primary care patient of Early Osmond, NP . The community resource team was consulted for assistance with Transportation Needs , Food Insecurity, Financial Difficulties related to Housing , and applying for disability   SDOH screenings and interventions completed:  Yes        Care guide performed the following interventions: Patient provided with information about care guide support team and interviewed to confirm resource needs.LIEAP , Food stamp application mailed to patient with food banks provided guidance on filing for disabilty   Follow Up Plan:  No further follow up planned at this time. The patient has been provided with needed resources.  Encounter Outcome:  Patient Visit Completed Dione Booze  [facility] HealthPopulation Health Care Guide  Direct Dial:[phone] Fax:[phone] Website: [email or web address]

## 2023-09-02 NOTE — ED Provider Notes (Signed)
[place] EMERGENCY DEPARTMENT AT [facility] Provider Note   CSN: [id number] Arrival date & time: 09/02/23  1745     History  No chief complaint on file.   Rodney Kane is a 48 y.o. male.  48 year old male here today with wound on the outside of the left lower extremity.  Patient has a history of diabetes.  Received doxycycline on 21 January.  States that today he noticed there was an odor.  Has not had fever or chills.        Home Medications Prior to Admission medications   Medication Sig Start Date End Date Taking? Authorizing Provider  cephALEXin (KEFLEX) 500 MG capsule Take 1 capsule (500 mg total) by mouth 4 (four) times daily. 09/02/23  Yes Anders Simmonds T, DO  sulfamethoxazole-trimethoprim (BACTRIM DS) 800-160 MG tablet Take 1 tablet by mouth 2 (two) times daily for 10 days. 09/02/23 09/12/23 Yes Anders Simmonds T, DO  Blood Glucose Monitoring Suppl DEVI 1 each by Does not apply route in the morning, at noon, and at bedtime. May substitute to any manufacturer covered by patient's insurance. 08/21/23   Ivonne Andrew, NP  furosemide (LASIX) 40 MG tablet Take 40 mg by mouth daily. Prescribed: Corine Shelter, [place]    [provider]  glipiZIDE (GLUCOTROL) 5 MG tablet Take 1 tablet (5 mg total) by mouth daily before breakfast. 08/11/23   Achille Rich, PA-C  Glucose Blood (BLOOD GLUCOSE TEST STRIPS) STRP 1 each by In Vitro route in the morning, at noon, and at bedtime. May substitute to any manufacturer covered by patient's insurance. 08/21/23 09/20/23  Ivonne Andrew, NP  insulin aspart (NOVOLOG) 100 UNIT/ML injection Inject 10 Units into the skin daily as needed for high blood sugar. PRN patient endorses sxs    [provider]  Lancet Device MISC 1 each by Does not apply route in the morning, at noon, and at bedtime. May substitute to any manufacturer covered by patient's insurance. 08/21/23 09/20/23  Ivonne Andrew, NP  Lancets Misc. MISC 1 each  by Does not apply route in the morning, at noon, and at bedtime. May substitute to any manufacturer covered by patient's insurance. Patient not taking: Reported on 08/25/2023 08/21/23 09/20/23  Ivonne Andrew, NP  losartan (COZAAR) 25 MG tablet Take 1 tablet (25 mg total) by mouth daily. 08/11/23 09/10/23  Achille Rich, PA-C  metFORMIN (GLUCOPHAGE-XR) 500 MG 24 hr tablet Take 1 tablet (500 mg total) by mouth 2 (two) times daily with a meal. 08/21/23   Ivonne Andrew, NP  metoprolol succinate (TOPROL-XL) 50 MG 24 hr tablet Take 1 tablet (50 mg total) by mouth daily. Take with or immediately following a meal. 02/14/23 08/21/23  Arthor Captain, PA-C  mupirocin ointment (BACTROBAN) 2 % Apply 1 Application topically 2 (two) times daily. 09/02/23   Ivonne Andrew, NP      Allergies    Patient has no allergy information on record.    Review of Systems   Review of Systems  Physical Exam Updated Vital Signs BP (!) 171/106   Pulse (!) 105   Temp 98 F (36.7 C)   Resp 17   SpO2 100%  Physical Exam Vitals reviewed.  Constitutional:      Appearance: He is not toxic-appearing.  HENT:     Head: Normocephalic.  Musculoskeletal:     Cervical back: Normal range of motion.  Skin:    General: Skin is warm.     Comments: On the  left lateral extremity there is a circular 4 cm ulceration.  Subcutaneous tissue visible.  There is trace purulence.  Surrounding erythema without crepitus.  Neurological:     Mental Status: He is alert.     ED Results / Procedures / Treatments   Labs (all labs ordered are listed, but only abnormal results are displayed) Labs Reviewed  CBC WITH DIFFERENTIAL/PLATELET - Abnormal; Notable for the following components:      Result Value   RBC 5.91 (*)    MCH 25.2 (*)    All other components within normal limits  BASIC METABOLIC PANEL - Abnormal; Notable for the following components:   Glucose, Bld 309 (*)    All other components within normal limits     EKG None  Radiology CT EXTREMITY LOWER LEFT WO CONTRAST Result Date: 09/02/2023 CLINICAL DATA:  Left lateral lower leg wound for several months. EXAM: CT OF THE LOWER LEFT EXTREMITY WITHOUT CONTRAST TECHNIQUE: Multidetector CT imaging of the lower left extremity was performed according to the standard protocol. RADIATION DOSE REDUCTION: This exam was performed according to the departmental dose-optimization program which includes automated exposure control, adjustment of the mA and/or kV according to patient size and/or use of iterative reconstruction technique. COMPARISON:  None Available. FINDINGS: Bones/Joint/Cartilage No bony destruction or periosteal reaction. No fracture or dislocation. Degenerative subchondral cyst in the peripheral medial tibial plateau. No joint effusion. Ligaments Ligaments are suboptimally evaluated by CT. Muscles and Tendons Grossly intact.  No muscle atrophy. Soft tissue Superficial ulceration along the mid to distal lateral lower leg with adjacent skin thickening. Lower leg soft tissue swelling extends to the ankle. No fluid collection. No soft tissue mass. IMPRESSION: 1. Superficial ulceration along the mid to distal lateral lower leg with adjacent skin thickening. No abscess or osteomyelitis. Electronically Signed   By: Obie Dredge M.D.   On: 09/02/2023 19:59    Procedures Procedures    Medications Ordered in ED Medications  cefTRIAXone (ROCEPHIN) injection 500 mg (500 mg Intramuscular Given 09/02/23 2017)  sulfamethoxazole-trimethoprim (BACTRIM DS) 800-160 MG per tablet 1 tablet (1 tablet Oral Given 09/02/23 2017)    ED Course/ Medical Decision Making/ A&P                                 Medical Decision Making 48 year old male here today with leg ulceration.  Differential diagnoses include diabetic ulcer, less likely necrotizing soft tissue infection, less likely osteomyelitis.  Plan- ulceration with some purulence.  No crepitus.  Triage team  ordered labs, CT imaging of the lower extremity.  No evidence of osteomyelitis or gas on exam.  Believe patient is appropriate for expanded antibiotic use.  Will discharge patient with prescription for Keflex, Bactrim.  He received a dose of Rocephin, Bactrim here in the emergency room.  Keflex and Bactrim sent to his pharmacy.  Wound care discussed with the patient bedside.  Concerning symptoms discussed with the patient at bedside.  Risk Prescription drug management.           Final Clinical Impression(s) / ED Diagnoses Final diagnoses:  Diabetic ulcer of ankle (HCC)    Rx / DC Orders ED Discharge Orders          Ordered    Ambulatory referral to Infectious Disease  Status:  Canceled       Comments: Cellulitis patient:  Received dalbavancin on 09/02/2023.   09/02/23 1952    sulfamethoxazole-trimethoprim (BACTRIM DS)  800-160 MG tablet  2 times daily        09/02/23 2052    cephALEXin (KEFLEX) 500 MG capsule  4 times daily        09/02/23 2052              Anders Simmonds T, DO 09/02/23 2053

## 2023-09-03 ENCOUNTER — Ambulatory Visit: Payer: Self-pay | Admitting: Nurse Practitioner

## 2023-09-03 ENCOUNTER — Telehealth: Payer: Self-pay

## 2023-09-03 DIAGNOSIS — H524 Presbyopia: Secondary | ICD-10-CM | POA: Diagnosis not present

## 2023-09-03 NOTE — Telephone Encounter (Signed)
  Chief Complaint: Leg Wound Symptoms: smell from leg wound Frequency: diagnosis last night in ED Pertinent Negatives: Patient denies fever, CP, SOB Disposition: [] ED /[] Urgent Care (no appt availability in office) / [] Appointment(In office/virtual)/ []  [place] [facility]/ [] Home Care/ [] Refused Recommended Disposition /[] [place] [facility]/ [x]  Follow-up with PCP Additional Notes: patient was seen last night in ED and diagnosed with wound infection to left leg. Patient was started on Bactrim and Keflex. Patient calling for about post ED visit. Patient does have a current appointment schedule for 09/21/2023. Patient needs follow up for ED follow up. Patient verbalized understanding of plan and all questions answered.       Copied from CRM [id number]. Topic: Clinical - Red Word Triage >> Sep 03, 2023  2:56 PM Martha Clan wrote: Red Word that prompted transfer to Nurse Triage: Rodney Kane to ER last night for wound on leg. Diabetes related wound. Beginning to smell. Reason for Disposition  [1] Caller has NON-URGENT question AND [2] triager unable to answer question  Answer Assessment - Initial Assessment Questions 1. SYMPTOM: "What's the main symptom you're concerned about?" (e.g., redness, swelling, pain, fever, weakness)     Smell, burning sensation 2. WOUND INFECTION LOCATION: "Where is the wound infection located?" (e.g., arm, face, foot, knee, leg)     Left leg 3. WOUND INFECTION SIZE: "What is the size of the red area?" (e.g., inches, centimeters; compare to size of a coin)      Big as a half dollar 4. BETTER-SAME-WORSE: "Are you getting better, staying the same, or getting worse compared to the day you started the antibiotics?"      Same 5. PAIN: "Do you have any pain?"  If Yes, ask: "How bad is the pain?"  (e.g., Scale 1-10; mild, moderate, or severe)    - MILD (1-3): Doesn't interfere with normal activities.     - MODERATE (4-7): Interferes with normal activities or awakens from sleep.     - SEVERE (8-10): Excruciating pain, unable to do any normal activities.       Mild 6. FEVER: "Do you have a fever?" If Yes, ask: "What is it, how was it measured and when did it start?"     No 7. OTHER SYMPTOMS: "Do you have any other symptoms?" (e.g., pus coming from a wound, red streaks, weakness)     Smell from wound 8. DIAGNOSIS DATE: "When was the wound infection diagnosed?" "By whom?"      Diagnosis last night in ED 9. ANTIBIOTIC NAME: "What antibiotic(s) are you taking?"  "How many times a day?" Note: Be sure the patient is taking the antibiotic as directed.      Bactrim and Keflex 10. ANTIBIOTIC DATE: "When was the antibiotic started?"       Started today 11. FOLLOW-UP APPOINTMENT: "Do you have a follow-up appointment with your doctor?"       Calling to make an appointment  Protocols used: Wound Infection on Antibiotic Follow-up Call-A-AH

## 2023-09-03 NOTE — Progress Notes (Signed)
Care Guide Pharmacy Note  09/03/2023 Name: Rodney Kane MRN: [id number] DOB: 1976/04/24  Referred By: Ivonne Andrew, NP Reason for referral: Care Coordination (Outreach to schedule face to face with Pharm d )   Rodney Kane is a 48 y.o. year old male who is a primary care patient of Ivonne Andrew, NP.  Rodney Kane was referred to the pharmacist for assistance related to: DMII  Successful contact was made with the patient to discuss pharmacy services including being ready for the pharmacist to call at least 5 minutes before the scheduled appointment time and to have medication bottles and any blood pressure readings ready for review. The patient agreed to meet with the pharmacist via telephone visit on (date/time).09/08/2023  Penne Lash , RMA     [facility], [facility] Guide  Direct Dial: [phone]  Website: [email or web address]

## 2023-09-04 ENCOUNTER — Other Ambulatory Visit: Payer: Self-pay | Admitting: Nurse Practitioner

## 2023-09-04 ENCOUNTER — Telehealth: Payer: Self-pay

## 2023-09-04 MED ORDER — MUPIROCIN 2 % EX OINT
1.0000 | TOPICAL_OINTMENT | Freq: Two times a day (BID) | CUTANEOUS | 0 refills | Status: DC
  Filled 2023-09-04: qty 22, 11d supply, fill #0

## 2023-09-04 NOTE — Transitions of Care (Post Inpatient/ED Visit) (Signed)
09/04/2023  Name: Rodney Kane MRN: [id number] DOB: 06/30/1976  Today's TOC FU Call Status:   Patient's Name and Date of Birth confirmed.  Transition Care Management Follow-up Telephone Call Date of Discharge: 09/02/23 Discharge Facility: [facility] ([facility]) Type of Discharge: Emergency Department Reason for ED Visit: Other: How have you been since you were released from the hospital?: Better Any questions or concerns?: No  Items Reviewed: Did you receive and understand the discharge instructions provided?: Yes Medications obtained,verified, and reconciled?: Yes (Medications Reviewed) Any new allergies since your discharge?: No Dietary orders reviewed?: No Do you have support at home?: No  Medications Reviewed Today: Medications Reviewed Today     Reviewed by Veneta Penton, CMA (Certified Medical Assistant) on 09/04/23 at 1629  Med List Status: <None>   Medication Order Taking? Sig Documenting Provider Last Dose Status Informant  Blood Glucose Monitoring Suppl DEVI [id number] Yes 1 each by Does not apply route in the morning, at noon, and at bedtime. May substitute to any manufacturer covered by patient's insurance. Ivonne Andrew, NP Taking Active            Med Note Para March, [place] R   Tue Aug 25, 2023  1:04 PM) * did not pick up the new glucometer  cephALEXin (KEFLEX) 500 MG capsule [id number] Yes Take 1 capsule (500 mg total) by mouth 4 (four) times daily. Arletha Pili, DO Taking Active   furosemide (LASIX) 40 MG tablet [id number] Yes Take 40 mg by mouth daily. Prescribed: Corine Shelter, [place] [provider] Taking Active Self           Med Note Para March, [facility] R   Tue Aug 25, 2023  1:17 PM) Patient is taking an old prescription to manage his blood pressure. Prescribed 09/05/2021  glipiZIDE (GLUCOTROL) 5 MG tablet [id number] Yes Take 1 tablet (5 mg total) by mouth daily before breakfast. Achille Rich, PA-C Taking Active            Med Note Para March,  [facility] R   Tue Aug 25, 2023  1:06 PM) Patient reports he picked up medication  Glucose Blood (BLOOD GLUCOSE TEST STRIPS) STRP [id number] Yes 1 each by In Vitro route in the morning, at noon, and at bedtime. May substitute to any manufacturer covered by patient's insurance. Ivonne Andrew, NP Taking Active   insulin aspart (NOVOLOG) 100 UNIT/ML injection [id number] No Inject 10 Units into the skin daily as needed for high blood sugar. PRN patient endorses sxs  Patient not taking: Reported on 09/04/2023   [provider] Not Taking Active   Lancet Device MISC [id number] Yes 1 each by Does not apply route in the morning, at noon, and at bedtime. May substitute to any manufacturer covered by patient's insurance. Ivonne Andrew, NP Taking Active            Med Note Para March, [facility] R   Tue Aug 25, 2023  1:10 PM) Using about twice daily  Lancets Misc. MISC [id number] Yes 1 each by Does not apply route in the morning, at noon, and at bedtime. May substitute to any manufacturer covered by patient's insurance. Ivonne Andrew, NP Taking Active            Med Note Para March, [facility] R   Tue Aug 25, 2023  1:10 PM) duplicate  losartan (COZAAR) 25 MG tablet [id number] Yes Take 1 tablet (25 mg total) by mouth daily. Achille Rich, PA-C Taking Active   metFORMIN (GLUCOPHAGE-XR) 500 MG 24  hr tablet [id number] Yes Take 1 tablet (500 mg total) by mouth 2 (two) times daily with a meal. Ivonne Andrew, NP Taking Active            Med Note Para March, [facility] R   Tue Aug 25, 2023  1:11 PM) Per patient he picked up yesterday  metoprolol succinate (TOPROL-XL) 50 MG 24 hr tablet [id number] Yes Take 1 tablet (50 mg total) by mouth daily. Take with or immediately following a meal. Arthor Captain, PA-C Taking Active   mupirocin ointment (BACTROBAN) 2 % [id number] No Apply 1 Application topically 2 (two) times daily.  Patient not taking: Reported on 09/04/2023   Ivonne Andrew, NP Not Taking Active    sulfamethoxazole-trimethoprim (BACTRIM DS) 800-160 MG tablet [id number] Yes Take 1 tablet by mouth 2 (two) times daily for 10 days. Arletha Pili, DO Taking Active             Home Care and Equipment/Supplies: Were Home Health Services Ordered?: No Any new equipment or medical supplies ordered?: No  Functional Questionnaire: Do you need assistance with bathing/showering or dressing?: No Do you need assistance with meal preparation?: No Do you need assistance with eating?: No Do you have difficulty maintaining continence: No Do you need assistance with getting out of bed/getting out of a chair/moving?: No Do you have difficulty managing or taking your medications?: No  Follow up appointments reviewed: PCP Follow-up appointment confirmed?: Yes Date of PCP follow-up appointment?: 09/16/23 Follow-up Provider: Angus Seller Specialist [facility] Follow-up appointment confirmed?: No Do you need transportation to your follow-up appointment?: No Do you understand care options if your condition(s) worsen?: Yes-patient verbalized understanding    SIGNATURE Bessie Livingood, CMA

## 2023-09-05 ENCOUNTER — Other Ambulatory Visit (HOSPITAL_COMMUNITY): Payer: Self-pay

## 2023-09-07 ENCOUNTER — Ambulatory Visit: Payer: Self-pay

## 2023-09-08 ENCOUNTER — Ambulatory Visit: Payer: Medicaid Other

## 2023-09-08 VITALS — BP 123/91 | HR 103

## 2023-09-08 DIAGNOSIS — I10 Essential (primary) hypertension: Secondary | ICD-10-CM

## 2023-09-08 DIAGNOSIS — E118 Type 2 diabetes mellitus with unspecified complications: Secondary | ICD-10-CM

## 2023-09-08 DIAGNOSIS — I6300 Cerebral infarction due to thrombosis of unspecified precerebral artery: Secondary | ICD-10-CM

## 2023-09-08 DIAGNOSIS — E7800 Pure hypercholesterolemia, unspecified: Secondary | ICD-10-CM

## 2023-09-08 NOTE — Patient Instructions (Addendum)
Please start taking Lantus 16 units once daily  Please start taking atorvastatin 40 mg daily  We will increase your losartan to 50 mg daily since your blood pressure was a bit elevated today in clinic.   Please continue all other medications. It was great to see you today!

## 2023-09-08 NOTE — Progress Notes (Unsigned)
09/08/2023 Name: Rodney Kane MRN: [id number] DOB: 1975-12-03  Chief Complaint  Patient presents with   Hypertension   Diabetes Mellitus    Rodney Kane is a 48 y.o. year old male who was referred for medication management by their primary care provider, Ivonne Andrew, NP. They presented for a face to face visit today.   They were referred to the pharmacist by their PCP for assistance in managing diabetes. PMH includes HF (EF 45-50% in Aug 2023, LV global hypokinesis, G1DD), atrial fibrillation, HTN, CVA (2021), hx of VTE (2022), T2DM. Patient is following with complex care management for transportation needs, food insecurity, financial difficulties related to housing, and applying for disability. 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   Subjective:  Patient was outreached on 08/25/23 by Cephus Shelling, PharmD after patient had recently been seen in the ED 2/2 hyperglycemia and cellulitis (completed abx). Pt reported adherence to glipizide 5 mg daily and metformin 500 mg BID, but also reported self-treating with an old Rx of Novolog - of which he would take 10 units once daily PRN for symptoms of hyperglycemia. He was scheduled for an in person appt for a medication review.  Patient was seen in the ED again on 09/02/23 for a wound on his LLE (ankle) - no evidence of osteomyelitis, was discharged with cephalexin and SMX/TMP. He also received a dose of CFX in the ED.  Today patient reports he is doing well - his main complaint is that he continues to have pain in his leg with wound that is not healing well. He has been taking his abx as prescribed. He was not able to pick up mupirocin ointment (this was sent to [facility] pharmacy, instead of his [facility] pharmacy). He has been taking his medications consistently since being prescribed metformin and glipizide.   Care Team: Primary Care Provider: Ivonne Andrew, NP ; Next Scheduled Visit: 09/16/23 (and  09/21/23?)  Medication Access/Adherence  Current Pharmacy:  [facility] [address] [place] [address] Phone: [phone] Fax: [phone]  [facility] [address], [place] - [address] [place] [address] Phone: [phone] Fax: [phone]   Patient reports affordability concerns with their medications: Yes  Patient reports access/transportation concerns to their pharmacy: No  Patient reports adherence concerns with their medications:  No  - denies missed doses over the past few weeks.    Diabetes:  Current medications: glipizide 5 mg PO daily (not taking in relation to food, metformin 500 XR BID (12-1PM, 6-8PM), also uses Novolog (vial)  5 units PRN when he checks his BG and it is high (I.e. > 300). Medications tried in the past: patient does not recall but appears he was prescribed Lantus, sitagliptin, and linagliptin in the past  Current glucose readings:  Using AccuChek Guide meter; testing 2-3 times per day 2/16 at 9:45PM: 306  2/10 at 7:19PM: 338 2/8 at 1:42PM: 349, 8:50PM 320 2/7 1:51AM 501, 1:59 AM 295, 11:32PM 337 2/5 2:30AM 433, 2:10PM 299, 2:30AM: 433  Patient denies hypoglycemic s/sx including dizziness, shakiness, sweating. Patient reports polydipsia which has improved, nocturia improved from 3-4x/night now 2-3x/night, blurred vision (recent eye appt last week).   Current meal patterns: Typically 1-2 meals/day around12-1PM and then in the evening 6-8PM. Trying to reduce drinking regular sodas, and trying to drink more water and diet/sugar free options. Reports eating protein with meals.  - Lunch: Trying to increase  fruit intake,  - Supper: varies - Drinks water all throughout the day, has switched to diet/sugar-free sodas, koolaid punch zero sugar  Current physical activity: occasional walking, no other physical activity   Current medication access support: insurance - Medicaid  [employer]  Hypertension:  Current medications: losartan 25 mg PO daily,  Medications previously tried: furosemide 40 mg daily PRN (he was instructed to stop this at pharmacy telephone appt on 08/25/23 as it was an old Rx), hydralazine  Patient has a validated, automated, upper arm home BP cuff. Checking 2-3 times/week.  Current blood pressure readings readings: none to report today  Patient denies hypotensive s/sx including dizziness, lightheadedness.  Patient denies hypertensive symptoms including headache, chest pain, shortness of breath Patient endorses swelling in his feet and ankle when he is sitting for long periods of time. Endorses some ShOB with exertion, denies ShOB at night with laying down.   Objective:  Lab Results  Component Value Date   HGBA1C 15.0 (A) 08/21/2023   eGFR > 60 mL/min  Lab Results  Component Value Date   CREATININE 1.18 09/02/2023   BUN 12 09/02/2023   NA 137 09/02/2023   K 4.5 09/02/2023   CL 100 09/02/2023   CO2 27 09/02/2023    Lab Results  Component Value Date   CHOL 132 05/30/2021   HDL 50 05/30/2021   LDLCALC 71 05/30/2021   TRIG 49 05/30/2021   CHOLHDL 2.6 05/30/2021    Medications Reviewed Today     Reviewed by Particia Lather, RPH (Pharmacist) on 09/08/23 at 1318  Med List Status: <None>   Medication Order Taking? Sig Documenting Provider Last Dose Status Informant  Blood Glucose Monitoring Suppl DEVI [id number]  1 each by Does not apply route in the morning, at noon, and at bedtime. May substitute to any manufacturer covered by patient's insurance. Ivonne Andrew, NP  Active            Med Note Para March, [facility] R   Tue Aug 25, 2023  1:04 PM) * did not pick up the new glucometer  cephALEXin (KEFLEX) 500 MG capsule [id number] Yes Take 1 capsule (500 mg total) by mouth 4 (four) times daily. Arletha Pili, DO Taking Active   furosemide (LASIX) 40 MG tablet [id number] No Take 40 mg by mouth daily. Prescribed: Corine Shelter, [place]   Patient not taking: Reported on 09/08/2023   [provider] Not Taking Active Self           Med Note Para March, [facility] R   Tue Aug 25, 2023  1:17 PM) Patient is taking an old prescription to manage his blood pressure. Prescribed 09/05/2021  glipiZIDE (GLUCOTROL) 5 MG tablet [id number]  Take 1 tablet (5 mg total) by mouth daily before breakfast. Achille Rich, PA-C  Active            Med Note Para March, [facility] R   Tue Aug 25, 2023  1:06 PM) Patient reports he picked up medication  Glucose Blood (BLOOD GLUCOSE TEST STRIPS) STRP [id number]  1 each by In Vitro route in the morning, at noon, and at bedtime. May substitute to any manufacturer covered by patient's insurance. Ivonne Andrew, NP  Active   insulin aspart (NOVOLOG) 100 UNIT/ML injection [id number]  Inject 10 Units into the skin daily as needed for high blood sugar. PRN patient endorses sxs  Patient not taking: Reported on 09/04/2023   [provider]  Active   Lancet Device MISC [id number]  1 each  by Does not apply route in the morning, at noon, and at bedtime. May substitute to any manufacturer covered by patient's insurance. Ivonne Andrew, NP  Active            Med Note Para March, [facility] R   Tue Aug 25, 2023  1:10 PM) Using about twice daily  Lancets Misc. MISC [id number]  1 each by Does not apply route in the morning, at noon, and at bedtime. May substitute to any manufacturer covered by patient's insurance. Ivonne Andrew, NP  Active            Med Note Para March, [facility] R   Tue Aug 25, 2023  1:10 PM) duplicate  losartan (COZAAR) 25 MG tablet [id number] Yes Take 1 tablet (25 mg total) by mouth daily. Achille Rich, PA-C Taking Active   metFORMIN (GLUCOPHAGE-XR) 500 MG 24 hr tablet [id number] Yes Take 1 tablet (500 mg total) by mouth 2 (two) times daily with a meal. Ivonne Andrew, NP Taking Active            Med Note Para March, [facility] R   Tue Aug 25, 2023  1:11 PM) Per patient he picked up yesterday  metoprolol succinate  (TOPROL-XL) 50 MG 24 hr tablet [id number]  Take 1 tablet (50 mg total) by mouth daily. Take with or immediately following a meal. Arthor Captain, PA-C  Expired 09/04/23 2359   mupirocin ointment (BACTROBAN) 2 % [id number]  Apply 1 Application topically 2 (two) times daily. Ivonne Andrew, NP  Active   sulfamethoxazole-trimethoprim (BACTRIM DS) 800-160 MG tablet [id number] Yes Take 1 tablet by mouth 2 (two) times daily for 10 days. Arletha Pili, DO Taking Active               Assessment/Plan:   Diabetes: - Currently uncontrolled with A1c 15% above goal < 7% with s/sx of over hyperglycemia. Fasting and PPG consistently > 300, therefore patient would benefit from addition of once daily basal insulin. He is willing to monitor his BG more closely. Would also like to start GLP-1RA given ASCVD history and BMI >40, but to promote adherence and avoid excess difficulty of regimen, will add medications in a stepwise fashion. Will continue metformin and glipizide at this time, with plan to discontinue SU once patient is able to start GLP-1RA.  - Reviewed long term cardiovascular and renal outcomes of uncontrolled blood sugar - Reviewed goal A1c, goal fasting, and goal 2 hour post prandial glucose - Reviewed dietary modifications including increasing protein intake, avoiding carbohydrate heavy foods. Maintaining water intake and avoiding sugar-sweetened beverages. Educated extensively on lifestyle interventions to avoid GI upset with GLP-1RA.  - Reviewed lifestyle modifications including:  - Recommend to start insulin glargine (Lantus) 16 units injected into the skin daily. Patient familiar with how to inject insulin and reports that he has used an insulin pen before.   - Continue metformin XR 500 mg PO BID and glipizide 5 mg daily with food - Recommend starting Ozempic 0.25 mg subcutaneous weekly at upcoming PCP appointment if patient has been able to successfully start insulin. - Patient denies  personal or family history of multiple endocrine neoplasia type 2, medullary thyroid cancer; personal history of pancreatitis or gallbladder disease. - Recommend to check glucose once daily fasting and bring meter to upcoming appointment.  - Due for UACR at upcoming PCP appointment.  Hypertension/HFrEF: - Currently uncontrolled but close to goal with BP 123/94 mmHg. Patient reports adherence to losartan without adverse effects. Will collaborate  with PCP to increase dose today and obtain repeat BMP and PCP follow-up next week. Patient expresses concern about being out of furosemide - reports intermittent LE swelling, denies ShOB. Will defer to PCP whether patient needs ongoing tx with loop diuretic. Will facilitate refill for metoprolol today as he was running low.   - Reviewed long term cardiovascular and renal outcomes of uncontrolled blood pressure - Reviewed appropriate blood pressure monitoring technique and reviewed goal blood pressure. Recommended to check home blood pressure and heart rate a couple times per week - Recommend to increase losartan to 50 mg PO daily - Obtain repeat BMP and UACR at upcoming PCP appt.   Hyperlipidemia/ASCVD Risk Reduction: - Currently uncontrolled and untreated with last LDL-C of 71 mg/dL (in 1610) above goal < 55 mg/dL given premature ASCVD. Patient amenable to restarting statin today. After appt noted hx of embolic strokes and Afib - patient was last on DOAC (Xarelto) in 2022. Patient did not bring this medication with him today and is therefore not currently appropriately anticoagulated. Will recommend discussion of restarting anticoagulation, such as Eliquis, and upcoming PCP appointment.  - Reviewed long term complications of uncontrolled cholesterol - Recommend to restart atorvastatin 40 mg PO daily   Follow Up Plan: PCP 09/16/23, Pharmacist in-person 10/20/23  Nils Pyle, PharmD PGY1 Pharmacy Resident

## 2023-09-09 MED ORDER — METOPROLOL SUCCINATE ER 50 MG PO TB24: 50.0000 mg | ORAL_TABLET | Freq: Every day | ORAL | 2 refills | Status: DC

## 2023-09-09 MED ORDER — PEN NEEDLES 32G X 4 MM MISC: 3 refills | Status: AC

## 2023-09-09 MED ORDER — ATORVASTATIN CALCIUM 40 MG PO TABS: 40.0000 mg | ORAL_TABLET | Freq: Every day | ORAL | 3 refills | Status: DC

## 2023-09-09 MED ORDER — LOSARTAN POTASSIUM 50 MG PO TABS: 50.0000 mg | ORAL_TABLET | Freq: Every day | ORAL | 3 refills | Status: DC

## 2023-09-09 MED ORDER — INSULIN GLARGINE 100 UNIT/ML ~~LOC~~ SOLN: 16.0000 [IU] | Freq: Every day | SUBCUTANEOUS | 3 refills | Status: DC

## 2023-09-09 MED ORDER — MUPIROCIN 2 % EX OINT: 1.0000 | TOPICAL_OINTMENT | Freq: Two times a day (BID) | CUTANEOUS | 0 refills | Status: DC

## 2023-09-09 NOTE — Telephone Encounter (Signed)
Called pt to find out if he went to the ed. Pt advised he did not. He stated it was getting better . Pt was advised to to make sure to come to his appt next week and if anything changes to go to the ED for emergency help.  Renelda Loma RMA

## 2023-09-16 ENCOUNTER — Ambulatory Visit: Payer: Medicaid Other | Admitting: Nurse Practitioner

## 2023-09-16 ENCOUNTER — Encounter: Payer: Self-pay | Admitting: Nurse Practitioner

## 2023-09-16 VITALS — BP 144/90 | HR 100 | Temp 98.9°F | Wt 330.2 lb

## 2023-09-16 DIAGNOSIS — N529 Male erectile dysfunction, unspecified: Secondary | ICD-10-CM

## 2023-09-16 DIAGNOSIS — E118 Type 2 diabetes mellitus with unspecified complications: Secondary | ICD-10-CM

## 2023-09-16 DIAGNOSIS — S81802A Unspecified open wound, left lower leg, initial encounter: Secondary | ICD-10-CM | POA: Diagnosis not present

## 2023-09-16 MED ORDER — DOXYCYCLINE HYCLATE 100 MG PO TABS: 100.0000 mg | ORAL_TABLET | Freq: Two times a day (BID) | ORAL | 0 refills | Status: AC

## 2023-09-16 MED ORDER — MUPIROCIN 2 % EX OINT: 1.0000 | TOPICAL_OINTMENT | Freq: Two times a day (BID) | CUTANEOUS | 2 refills | Status: DC

## 2023-09-16 NOTE — Patient Instructions (Addendum)
 1. Wound of left lower extremity, initial encounter (Primary)  - Ambulatory referral to Wound Clinic - doxycycline (VIBRA-TABS) 100 MG tablet; Take 1 tablet (100 mg total) by mouth 2 (two) times daily for 10 days.  Dispense: 20 tablet; Refill: 0 - CBC - Comprehensive metabolic panel  2. Type 2 diabetes mellitus with complication, without long-term current use of insulin (HCC)  - mupirocin ointment (BACTROBAN) 2 %; Apply 1 Application topically 2 (two) times daily.  Dispense: 30 g; Refill: 2 - CBC - Comprehensive metabolic panel  3. Erectile dysfunction, unspecified erectile dysfunction type  - Ambulatory referral to Urology  Follow up:  Follow up as scheduled

## 2023-09-16 NOTE — Progress Notes (Signed)
 Subjective   Patient ID: Rodney Kane, male    DOB: August 03, 1975, 48 y.o.   MRN: [id number]  Chief Complaint  Patient presents with   Hospitalization Follow-up    Referring provider: Ivonne Andrew, NP  Rodney Kane is a 48 y.o. male with Past Medical History: 06/2020: Coronavirus infection 06/2020: COVID No date: Diabetes mellitus without complication (HCC) 12/2019: Hemoglobin A1C greater than 9%, indicating poor diabetic  control 12/2019: Hyperglycemia 12/2019: Hyperlipidemia No date: Hypertension 12/2019: Hypertensive urgency 12/2019: Noncompliance with medication regimen 12/2016: Nonischemic (hypertensive) dilated cardiomyopathy (HCC)     Comment:  EF has been 30 to 35%, recently lower. No date: Stroke [facility]) 12/2019: Urine ketones 12/2019: Vitamin D deficiency   HPI  Presents today for hospital follow-up.  He was seen in the ED on 09/02/23 for wound to left lower extremity. He was treated with Rocephin, Keflex and bactrim. He has completed antibiotics with no improved noted.  CT to lower extremity was clear.  The area is draining purulent drainage.  Patient does not have any bandage to the area.  We discussed that he does need to keep the area clean apply Bactroban ointment and keep the area wrapped.  We will place an urgent referral to wound care considering patient does have diabetes. Denies f/c/s, n/v/d, hemoptysis, PND, leg swelling Denies chest pain or edema  Patient is also requesting referral to urology for erectile dysfunction today.  We will place this referral today.   Not on File  Immunization History  Administered Date(s) Administered   Tdap 12/01/2017    Tobacco History: Social History   Tobacco Use  Smoking Status Never  Smokeless Tobacco Never   Counseling given: Not Answered   Outpatient Encounter Medications as of 09/16/2023  Medication Sig   atorvastatin (LIPITOR) 40 MG tablet Take 1 tablet (40 mg total) by mouth daily.    Blood Glucose Monitoring Suppl DEVI 1 each by Does not apply route in the morning, at noon, and at bedtime. May substitute to any manufacturer covered by patient's insurance.   doxycycline (VIBRA-TABS) 100 MG tablet Take 1 tablet (100 mg total) by mouth 2 (two) times daily for 10 days.   furosemide (LASIX) 40 MG tablet Take 40 mg by mouth daily. Prescribed: Corine Shelter, PA   glipiZIDE (GLUCOTROL) 5 MG tablet Take 1 tablet (5 mg total) by mouth daily before breakfast.   Glucose Blood (BLOOD GLUCOSE TEST STRIPS) STRP 1 each by In Vitro route in the morning, at noon, and at bedtime. May substitute to any manufacturer covered by patient's insurance.   insulin aspart (NOVOLOG) 100 UNIT/ML injection Inject 10 Units into the skin daily as needed for high blood sugar. PRN patient endorses sxs   insulin glargine (LANTUS) 100 UNIT/ML injection Inject 0.16 mLs (16 Units total) into the skin daily. May increase up to 30 units once daily as directed by your provider.   Insulin Pen Needle (PEN NEEDLES) 32G X 4 MM MISC Use as directed to inject insulin once daily   Lancet Device MISC 1 each by Does not apply route in the morning, at noon, and at bedtime. May substitute to any manufacturer covered by patient's insurance.   Lancets Misc. MISC 1 each by Does not apply route in the morning, at noon, and at bedtime. May substitute to any manufacturer covered by patient's insurance.   losartan (COZAAR) 50 MG tablet Take 1 tablet (50 mg total) by mouth daily.   metFORMIN (GLUCOPHAGE-XR) 500 MG 24 hr  tablet Take 1 tablet (500 mg total) by mouth 2 (two) times daily with a meal.   metoprolol succinate (TOPROL-XL) 50 MG 24 hr tablet Take 1 tablet (50 mg total) by mouth daily. Take with or immediately following a meal.   [DISCONTINUED] mupirocin ointment (BACTROBAN) 2 % Apply 1 Application topically 2 (two) times daily.   cephALEXin (KEFLEX) 500 MG capsule Take 1 capsule (500 mg total) by mouth 4 (four) times daily. (Patient  not taking: Reported on 09/16/2023)   mupirocin ointment (BACTROBAN) 2 % Apply 1 Application topically 2 (two) times daily.   No facility-administered encounter medications on file as of 09/16/2023.    Review of Systems  Review of Systems  Constitutional: Negative.   HENT: Negative.    Cardiovascular: Negative.   Gastrointestinal: Negative.   Allergic/Immunologic: Negative.   Neurological: Negative.   Psychiatric/Behavioral: Negative.       Objective:   BP (!) 144/90   Pulse 100   Temp 98.9 F (37.2 C)   Wt (!) 330 lb 3.2 oz (149.8 kg)   SpO2 100%   BMI 43.56 kg/m   Wt Readings from Last 5 Encounters:  09/16/23 (!) 330 lb 3.2 oz (149.8 kg)  08/21/23 (!) 317 lb 9.6 oz (144.1 kg)  08/11/23 (!) 343 lb (155.6 kg)  02/14/23 (!) 308 lb (139.7 kg)  07/30/22 (!) 324 lb (147 kg)     Physical Exam Vitals and nursing note reviewed.  Constitutional:      General: He is not in acute distress.    Appearance: He is well-developed.  Cardiovascular:     Rate and Rhythm: Normal rate and regular rhythm.  Pulmonary:     Effort: Pulmonary effort is normal.     Breath sounds: Normal breath sounds.  Skin:    General: Skin is warm and dry.          Comments: Large open wound with purulent drainage noted.   Neurological:     Mental Status: He is alert and oriented to person, place, and time.       Assessment & Plan:   Wound of left lower extremity, initial encounter -     Ambulatory referral to Wound Clinic -     Doxycycline Hyclate; Take 1 tablet (100 mg total) by mouth 2 (two) times daily for 10 days.  Dispense: 20 tablet; Refill: 0 -     CBC -     Comprehensive metabolic panel  Type 2 diabetes mellitus with complication, without long-term current use of insulin (HCC) -     Mupirocin; Apply 1 Application topically 2 (two) times daily.  Dispense: 30 g; Refill: 2 -     CBC -     Comprehensive metabolic panel  Erectile dysfunction, unspecified erectile dysfunction type -      Ambulatory referral to Urology     Return in about 2 weeks (around 09/30/2023) for wound check.   Ivonne Andrew, NP 09/16/2023

## 2023-09-17 LAB — COMPREHENSIVE METABOLIC PANEL
ALT: 17 [IU]/L (ref 0–44)
AST: 14 [IU]/L (ref 0–40)
Albumin: 4 g/dL — ABNORMAL LOW (ref 4.1–5.1)
Alkaline Phosphatase: 122 [IU]/L — ABNORMAL HIGH (ref 44–121)
BUN/Creatinine Ratio: 11 (ref 9–20)
BUN: 14 mg/dL (ref 6–24)
Bilirubin Total: 0.4 mg/dL (ref 0.0–1.2)
CO2: 25 mmol/L (ref 20–29)
Calcium: 9.4 mg/dL (ref 8.7–10.2)
Chloride: 99 mmol/L (ref 96–106)
Creatinine, Ser: 1.3 mg/dL — ABNORMAL HIGH (ref 0.76–1.27)
Globulin, Total: 3.1 g/dL (ref 1.5–4.5)
Glucose: 298 mg/dL — ABNORMAL HIGH (ref 70–99)
Potassium: 5.3 mmol/L — ABNORMAL HIGH (ref 3.5–5.2)
Sodium: 139 mmol/L (ref 134–144)
Total Protein: 7.1 g/dL (ref 6.0–8.5)
eGFR: 68 mL/min/{1.73_m2} (ref >59)

## 2023-09-17 LAB — CBC
Hematocrit: 45.2 % (ref 37.5–51.0)
Hemoglobin: 14.4 g/dL (ref 13.0–17.7)
MCH: 25.8 pg — ABNORMAL LOW (ref 26.6–33.0)
MCHC: 31.9 g/dL (ref 31.5–35.7)
MCV: 81 fL (ref 79–97)
Platelets: 284 10*3/uL (ref 150–450)
RBC: 5.58 x10E6/uL (ref 4.14–5.80)
RDW: 12.2 % (ref 11.6–15.4)
WBC: 6.7 10*3/uL (ref 3.4–10.8)

## 2023-09-21 ENCOUNTER — Ambulatory Visit: Payer: Self-pay | Admitting: Nurse Practitioner

## 2023-09-29 DIAGNOSIS — N5201 Erectile dysfunction due to arterial insufficiency: Secondary | ICD-10-CM | POA: Diagnosis not present

## 2023-10-01 ENCOUNTER — Ambulatory Visit: Payer: Self-pay | Admitting: Nurse Practitioner

## 2023-10-01 ENCOUNTER — Encounter: Payer: Self-pay | Admitting: Nurse Practitioner

## 2023-10-01 VITALS — BP 164/102 | HR 87 | Temp 97.4°F | Wt 341.0 lb

## 2023-10-01 DIAGNOSIS — R000 Tachycardia, unspecified: Secondary | ICD-10-CM

## 2023-10-01 DIAGNOSIS — E118 Type 2 diabetes mellitus with unspecified complications: Secondary | ICD-10-CM

## 2023-10-01 DIAGNOSIS — S81802A Unspecified open wound, left lower leg, initial encounter: Secondary | ICD-10-CM

## 2023-10-01 MED ORDER — DOXYCYCLINE HYCLATE 100 MG PO TABS: 100.0000 mg | ORAL_TABLET | Freq: Two times a day (BID) | ORAL | 0 refills | Status: AC

## 2023-10-01 MED ORDER — GLIPIZIDE 5 MG PO TABS: 5.0000 mg | ORAL_TABLET | Freq: Every day | ORAL | 0 refills | Status: DC

## 2023-10-01 MED ORDER — GLIPIZIDE 5 MG PO TABS: 5.0000 mg | ORAL_TABLET | Freq: Two times a day (BID) | ORAL | 2 refills | Status: DC

## 2023-10-01 NOTE — Progress Notes (Addendum)
 Subjective   Patient ID: Rodney Kane, male    DOB: 10-01-1975, 48 y.o.   MRN: [id number]  Chief Complaint  Patient presents with   Wound Check    Referring provider: Ivonne Andrew, NP  Ermin Corteze Khamis is a 48 y.o. male with Past Medical History: 06/2020: Coronavirus infection 06/2020: COVID No date: Diabetes mellitus without complication (HCC) 12/2019: Hemoglobin A1C greater than 9%, indicating poor diabetic  control 12/2019: Hyperglycemia 12/2019: Hyperlipidemia No date: Hypertension 12/2019: Hypertensive urgency 12/2019: Noncompliance with medication regimen 12/2016: Nonischemic (hypertensive) dilated cardiomyopathy (HCC)     Comment:  EF has been 30 to 35%, recently lower. No date: Stroke [facility]) 12/2019: Urine ketones 12/2019: Vitamin D deficiency   Wound Check He was originally treated more than 14 days ago (large open wound to lower left leg noted.). Previous treatment included oral antibiotics (has completed doxycycline with some improvement noted. Will extend this course today. patient does have appt with wound care scheduled for next week.). There has been colored discharge from the wound. The redness has improved. The swelling has improved. The pain has improved. There is difficulty moving the extremity or digit due to pain.    Note: patient does need refill on glipizide. A1c in office today was 13.6. will increase to twice daily.    No Known Allergies  Immunization History  Administered Date(s) Administered   Tdap 12/01/2017    Tobacco History: Social History   Tobacco Use  Smoking Status Never  Smokeless Tobacco Never   Counseling given: Not Answered   Outpatient Encounter Medications as of 10/01/2023  Medication Sig   atorvastatin (LIPITOR) 40 MG tablet Take 1 tablet (40 mg total) by mouth daily.   Blood Glucose Monitoring Suppl DEVI 1 each by Does not apply route in the morning, at noon, and at bedtime. May substitute to any  manufacturer covered by patient's insurance.   doxycycline (VIBRA-TABS) 100 MG tablet Take 1 tablet (100 mg total) by mouth 2 (two) times daily for 4 days.   insulin glargine (LANTUS) 100 UNIT/ML injection Inject 0.16 mLs (16 Units total) into the skin daily. May increase up to 30 units once daily as directed by your provider.   Insulin Pen Needle (PEN NEEDLES) 32G X 4 MM MISC Use as directed to inject insulin once daily   losartan (COZAAR) 50 MG tablet Take 1 tablet (50 mg total) by mouth daily.   metFORMIN (GLUCOPHAGE-XR) 500 MG 24 hr tablet Take 1 tablet (500 mg total) by mouth 2 (two) times daily with a meal.   metoprolol succinate (TOPROL-XL) 50 MG 24 hr tablet Take 1 tablet (50 mg total) by mouth daily. Take with or immediately following a meal.   mupirocin ointment (BACTROBAN) 2 % Apply 1 Application topically 2 (two) times daily.   cephALEXin (KEFLEX) 500 MG capsule Take 1 capsule (500 mg total) by mouth 4 (four) times daily. (Patient not taking: Reported on 10/01/2023)   furosemide (LASIX) 40 MG tablet Take 40 mg by mouth daily. Prescribed: [facility], [place] (Patient not taking: Reported on 10/01/2023)   glipiZIDE (GLUCOTROL) 5 MG tablet Take 1 tablet (5 mg total) by mouth 2 (two) times daily before a meal.   insulin aspart (NOVOLOG) 100 UNIT/ML injection Inject 10 Units into the skin daily as needed for high blood sugar. PRN patient endorses sxs (Patient not taking: Reported on 10/01/2023)   [DISCONTINUED] glipiZIDE (GLUCOTROL) 5 MG tablet Take 1 tablet (5 mg total) by mouth daily before breakfast. (Patient  not taking: Reported on 10/01/2023)   [DISCONTINUED] glipiZIDE (GLUCOTROL) 5 MG tablet Take 1 tablet (5 mg total) by mouth daily before breakfast.   No facility-administered encounter medications on file as of 10/01/2023.    Review of Systems  Review of Systems  Constitutional: Negative.   HENT: Negative.    Cardiovascular: Negative.   Gastrointestinal: Negative.   Skin:  Positive for  wound.       Wound to left out leg  Allergic/Immunologic: Negative.   Neurological: Negative.   Psychiatric/Behavioral: Negative.       Objective:   BP (!) 164/102   Pulse 87   Temp (!) 97.4 F (36.3 C)   Wt (!) 341 lb (154.7 kg)   SpO2 100%   BMI 44.99 kg/m   Wt Readings from Last 5 Encounters:  10/01/23 (!) 341 lb (154.7 kg)  09/16/23 (!) 330 lb 3.2 oz (149.8 kg)  08/21/23 (!) 317 lb 9.6 oz (144.1 kg)  08/11/23 (!) 343 lb (155.6 kg)  02/14/23 (!) 308 lb (139.7 kg)     Physical Exam Vitals and nursing note reviewed.  Constitutional:      General: He is not in acute distress.    Appearance: He is well-developed.  Cardiovascular:     Rate and Rhythm: Normal rate and regular rhythm.  Pulmonary:     Effort: Pulmonary effort is normal.     Breath sounds: Normal breath sounds.  Skin:    General: Skin is warm and dry.          Comments: Large open wound with purulent drainage noted  Neurological:     Mental Status: He is alert and oriented to person, place, and time.       Assessment & Plan:   Type 2 diabetes mellitus with complication, without long-term current use of insulin (HCC) -     Microalbumin / creatinine urine ratio  Wound of left lower extremity, initial encounter -     Doxycycline Hyclate; Take 1 tablet (100 mg total) by mouth 2 (two) times daily for 4 days.  Dispense: 8 tablet; Refill: 0  Racing heart beat -     Ambulatory referral to Cardiology  Other orders -     glipiZIDE; Take 1 tablet (5 mg total) by mouth 2 (two) times daily before a meal.  Dispense: 60 tablet; Refill: 2     Return in about 3 months (around 01/01/2024).   Ivonne Andrew, NP 10/01/2023

## 2023-10-01 NOTE — Patient Instructions (Signed)
 1. Type 2 diabetes mellitus with complication, without long-term current use of insulin (HCC) (Primary)  - Microalbumin/Creatinine Ratio, Urine  2. Wound of left lower extremity, initial encounter  - doxycycline (VIBRA-TABS) 100 MG tablet; Take 1 tablet (100 mg total) by mouth 2 (two) times daily for 4 days.  Dispense: 8 tablet; Refill: 0  3. Racing heart beat  - Ambulatory referral to Cardiology

## 2023-10-01 NOTE — Addendum Note (Signed)
 Addended by: Ivonne Andrew on: 10/01/2023 11:35 AM   Modules accepted: Orders

## 2023-10-02 LAB — MICROALBUMIN / CREATININE URINE RATIO
Creatinine, Urine: 219.5 mg/dL
Microalb/Creat Ratio: 4 mg/g{creat} (ref 0–29)
Microalbumin, Urine: 7.8 ug/mL

## 2023-10-07 DIAGNOSIS — N5201 Erectile dysfunction due to arterial insufficiency: Secondary | ICD-10-CM | POA: Diagnosis not present

## 2023-10-08 DIAGNOSIS — L97822 Non-pressure chronic ulcer of other part of left lower leg with fat layer exposed: Secondary | ICD-10-CM | POA: Diagnosis not present

## 2023-10-08 DIAGNOSIS — R600 Localized edema: Secondary | ICD-10-CM | POA: Diagnosis not present

## 2023-10-08 DIAGNOSIS — B952 Enterococcus as the cause of diseases classified elsewhere: Secondary | ICD-10-CM | POA: Diagnosis not present

## 2023-10-08 DIAGNOSIS — I70262 Atherosclerosis of native arteries of extremities with gangrene, left leg: Secondary | ICD-10-CM | POA: Diagnosis not present

## 2023-10-08 DIAGNOSIS — E1152 Type 2 diabetes mellitus with diabetic peripheral angiopathy with gangrene: Secondary | ICD-10-CM | POA: Diagnosis not present

## 2023-10-08 DIAGNOSIS — L03116 Cellulitis of left lower limb: Secondary | ICD-10-CM | POA: Diagnosis not present

## 2023-10-08 DIAGNOSIS — B965 Pseudomonas (aeruginosa) (mallei) (pseudomallei) as the cause of diseases classified elsewhere: Secondary | ICD-10-CM | POA: Diagnosis not present

## 2023-10-08 DIAGNOSIS — I872 Venous insufficiency (chronic) (peripheral): Secondary | ICD-10-CM | POA: Diagnosis not present

## 2023-10-13 DIAGNOSIS — L97822 Non-pressure chronic ulcer of other part of left lower leg with fat layer exposed: Secondary | ICD-10-CM | POA: Diagnosis not present

## 2023-10-13 DIAGNOSIS — I872 Venous insufficiency (chronic) (peripheral): Secondary | ICD-10-CM | POA: Diagnosis not present

## 2023-10-13 DIAGNOSIS — R2242 Localized swelling, mass and lump, left lower limb: Secondary | ICD-10-CM | POA: Diagnosis not present

## 2023-10-15 DIAGNOSIS — L97822 Non-pressure chronic ulcer of other part of left lower leg with fat layer exposed: Secondary | ICD-10-CM | POA: Diagnosis not present

## 2023-10-15 DIAGNOSIS — E1152 Type 2 diabetes mellitus with diabetic peripheral angiopathy with gangrene: Secondary | ICD-10-CM | POA: Diagnosis not present

## 2023-10-15 DIAGNOSIS — I70262 Atherosclerosis of native arteries of extremities with gangrene, left leg: Secondary | ICD-10-CM | POA: Diagnosis not present

## 2023-10-15 DIAGNOSIS — I872 Venous insufficiency (chronic) (peripheral): Secondary | ICD-10-CM | POA: Diagnosis not present

## 2023-10-15 DIAGNOSIS — R600 Localized edema: Secondary | ICD-10-CM | POA: Diagnosis not present

## 2023-10-22 DIAGNOSIS — L929 Granulomatous disorder of the skin and subcutaneous tissue, unspecified: Secondary | ICD-10-CM | POA: Diagnosis not present

## 2023-10-22 DIAGNOSIS — R600 Localized edema: Secondary | ICD-10-CM | POA: Diagnosis not present

## 2023-10-22 DIAGNOSIS — I83028 Varicose veins of left lower extremity with ulcer other part of lower leg: Secondary | ICD-10-CM | POA: Diagnosis not present

## 2023-10-22 DIAGNOSIS — Z6841 Body Mass Index (BMI) 40.0 and over, adult: Secondary | ICD-10-CM | POA: Diagnosis not present

## 2023-10-22 DIAGNOSIS — E1122 Type 2 diabetes mellitus with diabetic chronic kidney disease: Secondary | ICD-10-CM | POA: Diagnosis not present

## 2023-10-22 DIAGNOSIS — I96 Gangrene, not elsewhere classified: Secondary | ICD-10-CM | POA: Diagnosis not present

## 2023-10-22 DIAGNOSIS — I4891 Unspecified atrial fibrillation: Secondary | ICD-10-CM | POA: Diagnosis not present

## 2023-10-22 DIAGNOSIS — N189 Chronic kidney disease, unspecified: Secondary | ICD-10-CM | POA: Diagnosis not present

## 2023-10-22 DIAGNOSIS — Z79899 Other long term (current) drug therapy: Secondary | ICD-10-CM | POA: Diagnosis not present

## 2023-10-22 DIAGNOSIS — L989 Disorder of the skin and subcutaneous tissue, unspecified: Secondary | ICD-10-CM | POA: Diagnosis not present

## 2023-10-22 DIAGNOSIS — I129 Hypertensive chronic kidney disease with stage 1 through stage 4 chronic kidney disease, or unspecified chronic kidney disease: Secondary | ICD-10-CM | POA: Diagnosis not present

## 2023-10-22 DIAGNOSIS — I872 Venous insufficiency (chronic) (peripheral): Secondary | ICD-10-CM | POA: Diagnosis not present

## 2023-10-22 DIAGNOSIS — L988 Other specified disorders of the skin and subcutaneous tissue: Secondary | ICD-10-CM | POA: Diagnosis not present

## 2023-10-22 DIAGNOSIS — Z89022 Acquired absence of left finger(s): Secondary | ICD-10-CM | POA: Diagnosis not present

## 2023-10-22 DIAGNOSIS — Z794 Long term (current) use of insulin: Secondary | ICD-10-CM | POA: Diagnosis not present

## 2023-10-22 DIAGNOSIS — L97822 Non-pressure chronic ulcer of other part of left lower leg with fat layer exposed: Secondary | ICD-10-CM | POA: Diagnosis not present

## 2023-10-22 DIAGNOSIS — Z7984 Long term (current) use of oral hypoglycemic drugs: Secondary | ICD-10-CM | POA: Diagnosis not present

## 2023-10-27 ENCOUNTER — Other Ambulatory Visit (HOSPITAL_COMMUNITY): Payer: Self-pay

## 2023-10-27 ENCOUNTER — Ambulatory Visit (INDEPENDENT_AMBULATORY_CARE_PROVIDER_SITE_OTHER): Payer: Self-pay

## 2023-10-27 VITALS — BP 168/109 | HR 98

## 2023-10-27 DIAGNOSIS — E118 Type 2 diabetes mellitus with unspecified complications: Secondary | ICD-10-CM

## 2023-10-27 DIAGNOSIS — I10 Essential (primary) hypertension: Secondary | ICD-10-CM

## 2023-10-27 NOTE — Progress Notes (Unsigned)
 10/27/2023 Name: Rodney Kane MRN: [id number] DOB: 1975-11-21  Chief Complaint  Patient presents with   Diabetes   Hypertension   Hyperlipidemia    Rodney Kane is a 48 y.o. year old male who was referred for medication management by their primary care provider, Ivonne Andrew, NP. They presented for a face to face visit today.   They were referred to the pharmacist by their PCP for assistance in managing diabetes. PMH includes HF (EF 45-50% in Aug 2023, LV global hypokinesis, G1DD), atrial fibrillation, HTN, CVA (2021), hx of VTE (2022), T2DM. Patient is following with complex care management for transportation needs, food insecurity, financial difficulties related to housing, and applying for disability. 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   Subjective:  Patient was initially outreached on 08/25/23 by Cephus Shelling, PharmD after patient had recently been seen in the ED 2/2 hyperglycemia and cellulitis (completed abx). Pt reported adherence to glipizide 5 mg daily and metformin 500 mg BID, but also reported self-treating with an old Rx of Novolog - of which he would take 10 units once daily PRN for symptoms of hyperglycemia. He was scheduled for an in person appt for a medication review.  Patient was seen in the ED again on 09/02/23 for a wound on his LLE (ankle) - no evidence of osteomyelitis, was discharged with cephalexin and SMX/TMP. He also received a dose of CFX in the ED. At in-person pharmacy appointment with me on 09/08/23, patient brought all his medication bottles and glucometer. Random BG were ranging 200-300s, with symptomatic hyperglycemia. Initiated insulin glargine (Lantus) 16 units daily and increased losartan to 50 mg daily for elevated BP in the setting of HFrEF. Also restarted atorvastatin. Patient has followed up with his PCP several times since then for assessment of his wound, and he is now following with [facility] for wound care.    Today patient reports he is doing well. He did not bring his glucometer or medications with him today, but reports that a lot of his symptoms of hyperglycemia have resolved. He reports that he is taking all his oral medications every day around 11AM and his Lantus 16 units daily around 1 PM. He denies missed doses. He is not consistently checking his BG, but reports a reading in the high 100s a couple weeks ago. He reports that he discontinued use of rapid acting insulin as instructed.   Care Team: Primary Care Provider: Ivonne Andrew, NP ; Next Scheduled Visit: 01/14/24 Cardiology: Dr. Herbie Baltimore 12/07/23  Medication Access/Adherence  Current Pharmacy:  [facility] [address], [place] - [address] [place] [address] Phone: [phone] Fax: [phone]   Patient reports affordability concerns with their medications: Yes  Patient reports access/transportation concerns to their pharmacy: No  Patient reports adherence concerns with their medications:  No  - denies missed doses. Fill history appears appropriate today  Patient reports that he has had difficulty getting in contact with the social security office to apply for disability. Given his residual deficits with speech, he reports that it has been very difficult for him to communicate with them over the phone. He is no longer working with complex care management, and would like to be reconnected to a case Production designer, theatre/television/film. He also reports he has difficulty reading and filling out the disability forms due to residual deficits after his stroke.  Diabetes:  Current medications: glipizide 5 mg PO BID (reports taking once daily), metformin  500 XR BID, Lantus (insulin glargine) 16 units daily (~1PM) Medications tried in the past: Novolog, sitagliptin, and linagliptin in the past  Current glucose readings: none to report today, reports one reading in the 100s a couple weeks ago Using AccuChek Guide meter;  testing very infrequently  Patient endorses one episode of dizziness/lightheadedness that may have been related to hypoglycemia - he did not check his BG, but symptoms improved after eating a granola bar. Denies polydipsia, polyphagia, nocturia, blurred vision.   Current meal patterns: Typically 1-2 meals/day around12-1PM and then in the evening 6-8PM. Reports that some days he does not eat any meals, but instead snacks throughout the day (like multiple granola bars). Trying to reduce drinking regular sodas, and trying to drink more water and diet/sugar free options.  - Reports that it is difficult for him to cut out sweets. He will go a couple days without eating sweets, but then will eat a lot at one time.  - Lunch: not consistent, varies - Supper: varies - Drinks water all throughout the day, has switched to diet/sugar-free sodas, koolaid punch zero sugar, minutemaid zero sugar drinks  Current physical activity: occasional walking, has a "shake plate" at home (core exercise). No other strength training reported.   Current medication access support: insurance - Medicaid [employer]  Hypertension:  Current medications: losartan 50 mg PO daily,  Medications previously tried: furosemide 40 mg daily PRN (he was instructed to stop this at pharmacy telephone appt on 08/25/23 as it was an old Rx), hydralazine  Patient has a validated, automated, upper arm home BP cuff. He is not currently checking at home Current blood pressure readings readings: none to report today  Patient denies hypotensive s/sx including dizziness, lightheadedness.  Patient denies hypertensive symptoms including headache, chest pain, shortness of breath. Reports one episode of heart racing since last appointment - has appointment scheduled with cardiology in May 2025.  Denies swelling in his feet and ankles, except in his L leg related to his wound.   Objective:  BP Readings from Last 3 Encounters:  10/27/23 (!)  168/109  10/01/23 (!) 164/102  09/16/23 (!) 144/90   Lab Results  Component Value Date   HGBA1C 15.0 (A) 08/21/2023   Per PCP note on 10/01/23 - in-office A1c was 13.6%, though does not appear that this was entered into order results.   eGFR > 60 mL/min, UACR 10/01/23: 4 mg/g  Lab Results  Component Value Date   CREATININE 1.30 (H) 09/16/2023   BUN 14 09/16/2023   NA 139 09/16/2023   K 5.3 (H) 09/16/2023   CL 99 09/16/2023   CO2 25 09/16/2023    Lab Results  Component Value Date   CHOL 132 05/30/2021   HDL 50 05/30/2021   LDLCALC 71 05/30/2021   TRIG 49 05/30/2021   CHOLHDL 2.6 05/30/2021    Medications Reviewed Today     Reviewed by Particia Lather, RPH (Pharmacist) on 10/27/23 at 1434  Med List Status: <None>   Medication Order Taking? Sig Documenting Provider Last Dose Status Informant  atorvastatin (LIPITOR) 40 MG tablet [id number] Yes Take 1 tablet (40 mg total) by mouth daily. Ivonne Andrew, NP Taking Active   Blood Glucose Monitoring Suppl DEVI [id number]  1 each by Does not apply route in the morning, at noon, and at bedtime. May substitute to any manufacturer covered by patient's insurance. Ivonne Andrew, NP  Active            Med Note (  Cephus Shelling R   Tue Aug 25, 2023  1:04 PM) * did not pick up the new glucometer  insulin glargine (LANTUS) 100 UNIT/ML injection [id number] Yes Inject 0.16 mLs (16 Units total) into the skin daily. May increase up to 30 units once daily as directed by your provider. Ivonne Andrew, NP Taking Active            Med Note Caryn Section, Omarr Hann P   Tue Oct 27, 2023  2:34 PM) Taking 16 units daily  Insulin Pen Needle (PEN NEEDLES) 32G X 4 MM MISC [id number]  Use as directed to inject insulin once daily Ivonne Andrew, NP  Active   losartan (COZAAR) 50 MG tablet [id number] Yes Take 1 tablet (50 mg total) by mouth daily. Ivonne Andrew, NP Taking Active   metFORMIN (GLUCOPHAGE-XR) 500 MG 24 hr tablet [id number] Yes Take 1 tablet (500 mg total) by  mouth 2 (two) times daily with a meal. Ivonne Andrew, NP Taking Active            Med Note Para March, [facility] R   Tue Aug 25, 2023  1:11 PM) Per patient he picked up yesterday  metoprolol succinate (TOPROL-XL) 50 MG 24 hr tablet [id number] Yes Take 1 tablet (50 mg total) by mouth daily. Take with or immediately following a meal. Ivonne Andrew, NP Taking Active   mupirocin ointment (BACTROBAN) 2 % [id number]  Apply 1 Application topically 2 (two) times daily. Ivonne Andrew, NP  Active               Assessment/Plan:   Diabetes: - Currently uncontrolled with A1c 15% above goal < 7%. Per PCP note, POC A1c was down to 13.6% on 10/01/23, though this was not recorded in results. S/sx of hyperglycemia have resolved, suggesting that addition of basal insulin has resulted in improved glycemic control. Patient did not increase glipizide to twice daily as previously instructed, and reports one episode of possible hypoglycemia, though no BG values available today to confirm. Given ASCVD history and BMI > 40, feel that he is a great candidate for GLP-1RA semaglutide. Patient is amenable to replacing glipizide with once weekly injectable semaglutide, and is also eager to start a medication that may help with weight loss. Submitted PA today (Key: [id number]). Will continue metformin, as patient appears to be tolerating well.  - Reviewed long term cardiovascular and renal outcomes of uncontrolled blood sugar - Reviewed goal A1c, goal fasting, and goal 2 hour post prandial glucose - Reviewed dietary modifications including increasing protein intake, avoiding carbohydrate heavy foods. Maintaining water intake and avoiding sugar-sweetened beverages. Educated extensively on lifestyle interventions to avoid GI upset with GLP-1RA.  - Reviewed lifestyle modifications including: increasing weight bearing exercise to avoid muscle loss with GLP-1RA.  - Recommend to START Ozempic (semaglutide) 0.25 mg injected into the  skin once weekly x4 weeks then increase to 0.5 mg weekly. Utilized demo pen to educate on injection technique. Patient expressed understanding.  - Recommend to continue insulin glargine (Lantus) 16 units injected into the skin daily. Instructed to increase by 2 units every 3 days up to a maximum of 24 units daily if FBG remain > 150 mg/dL.  - Recommend to continue metformin XR 500 mg PO BID  - Recommend to stop glipizide - Patient denies personal or family history of multiple endocrine neoplasia type 2, medullary thyroid cancer; personal history of pancreatitis or gallbladder disease. - Recommend to check glucose once daily fasting and bring meter  to upcoming appointment. Patient would be a great candidate for CGM, will recommend at telephone follow-up.  - UACR March 2025 WNL - Due for repeat A1C at PCP follow-up  Hypertension/HFrEF: - Currently uncontrolled above goal < 130/80 mmHg. Patient reports adherence to losartan. Noted that repeat BMP ~1 week after starting losartan demonstrated acceptable Scr bump < 30%, but K 5.3 mEq/L without dose adjustment by PCP. Was not able to obtain repeat BMP today, as there was no provider in clinic to sign the order. With diagnosis of HFrEF, patient would be a good candidate for treatment with Entresto or spironolactone to further control BP, but cannot safely initiate these without repeat BMP - therefore, will recommend starting CCB amlodipine today. If pulse remains elevated at follow-up, consider titrating beta-blocker.  - Reviewed long term cardiovascular and renal outcomes of uncontrolled blood pressure - Reviewed appropriate blood pressure monitoring technique and reviewed goal blood pressure. Recommended to check home blood pressure and heart rate a couple times per week - Recommend to start amlodipine 5 mg PO daily - Recommend to continue losartan to 50 mg PO daily and metoprolol 50 mg PO daily - Obtain repeat BMP at upcoming cardiology or PCP  appointment  Hyperlipidemia/ASCVD Risk Reduction: - Currently uncontrolled with last LDL-C of 71 mg/dL (in 1610) above goal < 55 mg/dL given premature ASCVD, however patient has since restarted high-intensity statin.  - Reviewed long term complications of uncontrolled cholesterol - Recommend to continue atorvastatin 40 mg PO daily  - Repeat lipid panel at upcoming cardiology or PCP appointment  Atrial Fibrillation At last appointment, sent message to PCP requesting evaluation of patient to restart DOAC for Afib stroke prevention in the setting of history of CVA, DVT, and L mural thrombus. Discussed risk/benefit of anticoagulation with patient today. As he has an appointment with cardiology scheduled, will defer to Dr. Herbie Baltimore to represcribe anticoagulation. Patient reported that he experienced frequent minor bleeding on Xarelto in the past, so he may be a better candidate for Eliquis if he is able to adhere to twice daily dosing.   Follow Up Plan: Cardiology 12/07/23, Pharmacist telephone 12/08/23, PCP 01/14/24   Nils Pyle, PharmD PGY1 Pharmacy Resident

## 2023-10-27 NOTE — Patient Instructions (Addendum)
 It was great to see you today!  Please continue insulin glargine (Lantus) 16 units injected once daily.   START Ozempic 0.25 mg injected into the skin once a week for FOUR weeks, then INCREASE to 0.5 mg weekly.   START amlodipine 5 mg daily for blood pressure  Stop glipizide  Check your blood sugar once daily before eating anything. If the values are above 150 mg/dL, increase your insulin dose by 2 units up to a maximum of 24 units daily.   Do your best to increase protein intake and increase physical activity with starting Ozempic. If you experience severe stomach pain or vomiting, stop the medication and call the clinic.   Continue all other medications as prescribed.

## 2023-10-28 MED ORDER — AMLODIPINE BESYLATE 5 MG PO TABS: 5.0000 mg | ORAL_TABLET | Freq: Every day | ORAL | 3 refills | Status: DC

## 2023-10-28 MED ORDER — OZEMPIC (0.25 OR 0.5 MG/DOSE) 2 MG/1.5ML ~~LOC~~ SOPN: PEN_INJECTOR | SUBCUTANEOUS | 2 refills | Status: DC

## 2023-10-29 DIAGNOSIS — N189 Chronic kidney disease, unspecified: Secondary | ICD-10-CM | POA: Diagnosis not present

## 2023-10-29 DIAGNOSIS — Z79899 Other long term (current) drug therapy: Secondary | ICD-10-CM | POA: Diagnosis not present

## 2023-10-29 DIAGNOSIS — Z794 Long term (current) use of insulin: Secondary | ICD-10-CM | POA: Diagnosis not present

## 2023-10-29 DIAGNOSIS — L988 Other specified disorders of the skin and subcutaneous tissue: Secondary | ICD-10-CM | POA: Diagnosis not present

## 2023-10-29 DIAGNOSIS — Z7984 Long term (current) use of oral hypoglycemic drugs: Secondary | ICD-10-CM | POA: Diagnosis not present

## 2023-10-29 DIAGNOSIS — L989 Disorder of the skin and subcutaneous tissue, unspecified: Secondary | ICD-10-CM | POA: Diagnosis not present

## 2023-10-29 DIAGNOSIS — I872 Venous insufficiency (chronic) (peripheral): Secondary | ICD-10-CM | POA: Diagnosis not present

## 2023-10-29 DIAGNOSIS — I4891 Unspecified atrial fibrillation: Secondary | ICD-10-CM | POA: Diagnosis not present

## 2023-10-29 DIAGNOSIS — E1122 Type 2 diabetes mellitus with diabetic chronic kidney disease: Secondary | ICD-10-CM | POA: Diagnosis not present

## 2023-10-29 DIAGNOSIS — Z89022 Acquired absence of left finger(s): Secondary | ICD-10-CM | POA: Diagnosis not present

## 2023-10-29 DIAGNOSIS — L97822 Non-pressure chronic ulcer of other part of left lower leg with fat layer exposed: Secondary | ICD-10-CM | POA: Diagnosis not present

## 2023-10-29 DIAGNOSIS — R600 Localized edema: Secondary | ICD-10-CM | POA: Diagnosis not present

## 2023-10-29 DIAGNOSIS — I83028 Varicose veins of left lower extremity with ulcer other part of lower leg: Secondary | ICD-10-CM | POA: Diagnosis not present

## 2023-10-29 DIAGNOSIS — L929 Granulomatous disorder of the skin and subcutaneous tissue, unspecified: Secondary | ICD-10-CM | POA: Diagnosis not present

## 2023-10-29 DIAGNOSIS — I129 Hypertensive chronic kidney disease with stage 1 through stage 4 chronic kidney disease, or unspecified chronic kidney disease: Secondary | ICD-10-CM | POA: Diagnosis not present

## 2023-10-29 DIAGNOSIS — I96 Gangrene, not elsewhere classified: Secondary | ICD-10-CM | POA: Diagnosis not present

## 2023-10-29 DIAGNOSIS — Z6841 Body Mass Index (BMI) 40.0 and over, adult: Secondary | ICD-10-CM | POA: Diagnosis not present

## 2023-10-30 ENCOUNTER — Other Ambulatory Visit: Payer: Self-pay

## 2023-10-30 ENCOUNTER — Telehealth: Payer: Self-pay | Admitting: Nurse Practitioner

## 2023-10-30 DIAGNOSIS — E118 Type 2 diabetes mellitus with unspecified complications: Secondary | ICD-10-CM

## 2023-10-30 MED ORDER — OZEMPIC (0.25 OR 0.5 MG/DOSE) 2 MG/1.5ML ~~LOC~~ SOPN: PEN_INJECTOR | SUBCUTANEOUS | 2 refills | Status: DC

## 2023-10-30 MED ORDER — SEMAGLUTIDE(0.25 OR 0.5MG/DOS) 2 MG/3ML ~~LOC~~ SOPN: 0.2500 mg | PEN_INJECTOR | SUBCUTANEOUS | 2 refills | Status: DC

## 2023-10-30 NOTE — Telephone Encounter (Signed)
 Spoke to [employer] and resent  script for ozempic. KH

## 2023-10-30 NOTE — Telephone Encounter (Signed)
 Copied from CRM [id number]. Topic: Clinical - Prescription Issue >> Oct 30, 2023 10:36 AM Nyra Capes wrote: Reason for CRM: Dorathy Daft with [facility] pharmacy calling in Phone [phone]   Semaglutide,0.25 or 0.5MG /DOS, (OZEMPIC, 0.25 OR 0.5 MG/DOSE,) 2 MG/1.5ML SOPN   questions regarding pen dosage.

## 2023-11-05 DIAGNOSIS — E1122 Type 2 diabetes mellitus with diabetic chronic kidney disease: Secondary | ICD-10-CM | POA: Diagnosis not present

## 2023-11-05 DIAGNOSIS — I129 Hypertensive chronic kidney disease with stage 1 through stage 4 chronic kidney disease, or unspecified chronic kidney disease: Secondary | ICD-10-CM | POA: Diagnosis not present

## 2023-11-05 DIAGNOSIS — L929 Granulomatous disorder of the skin and subcutaneous tissue, unspecified: Secondary | ICD-10-CM | POA: Diagnosis not present

## 2023-11-05 DIAGNOSIS — N189 Chronic kidney disease, unspecified: Secondary | ICD-10-CM | POA: Diagnosis not present

## 2023-11-05 DIAGNOSIS — Z6841 Body Mass Index (BMI) 40.0 and over, adult: Secondary | ICD-10-CM | POA: Diagnosis not present

## 2023-11-05 DIAGNOSIS — Z89022 Acquired absence of left finger(s): Secondary | ICD-10-CM | POA: Diagnosis not present

## 2023-11-05 DIAGNOSIS — R600 Localized edema: Secondary | ICD-10-CM | POA: Diagnosis not present

## 2023-11-05 DIAGNOSIS — I96 Gangrene, not elsewhere classified: Secondary | ICD-10-CM | POA: Diagnosis not present

## 2023-11-05 DIAGNOSIS — L989 Disorder of the skin and subcutaneous tissue, unspecified: Secondary | ICD-10-CM | POA: Diagnosis not present

## 2023-11-05 DIAGNOSIS — Z794 Long term (current) use of insulin: Secondary | ICD-10-CM | POA: Diagnosis not present

## 2023-11-05 DIAGNOSIS — Z7984 Long term (current) use of oral hypoglycemic drugs: Secondary | ICD-10-CM | POA: Diagnosis not present

## 2023-11-05 DIAGNOSIS — I83028 Varicose veins of left lower extremity with ulcer other part of lower leg: Secondary | ICD-10-CM | POA: Diagnosis not present

## 2023-11-05 DIAGNOSIS — I872 Venous insufficiency (chronic) (peripheral): Secondary | ICD-10-CM | POA: Diagnosis not present

## 2023-11-05 DIAGNOSIS — I4891 Unspecified atrial fibrillation: Secondary | ICD-10-CM | POA: Diagnosis not present

## 2023-11-05 DIAGNOSIS — Z79899 Other long term (current) drug therapy: Secondary | ICD-10-CM | POA: Diagnosis not present

## 2023-11-05 DIAGNOSIS — L97822 Non-pressure chronic ulcer of other part of left lower leg with fat layer exposed: Secondary | ICD-10-CM | POA: Diagnosis not present

## 2023-11-05 DIAGNOSIS — L988 Other specified disorders of the skin and subcutaneous tissue: Secondary | ICD-10-CM | POA: Diagnosis not present

## 2023-11-06 ENCOUNTER — Other Ambulatory Visit (HOSPITAL_COMMUNITY): Payer: Self-pay

## 2023-11-09 ENCOUNTER — Other Ambulatory Visit (HOSPITAL_COMMUNITY): Payer: Self-pay

## 2023-11-09 ENCOUNTER — Telehealth: Payer: Self-pay

## 2023-11-09 DIAGNOSIS — I480 Paroxysmal atrial fibrillation: Secondary | ICD-10-CM

## 2023-11-09 MED ORDER — APIXABAN 5 MG PO TABS: 5.0000 mg | ORAL_TABLET | Freq: Two times a day (BID) | ORAL | 3 refills | Status: AC

## 2023-11-09 NOTE — Telephone Encounter (Addendum)
 Patient with history of Afib, CVA, DVT, and L mural thrombus. He has not been on anticoagulation since he was last prescribed Xarelto  20 mg daily in 2022. Patient has been lost to follow-up with cardiology, but has an appointment with Dr. Addie Holstein on 12/07/23. Discussed case with Dr. Addie Holstein, confirming that patient should be restarted on DOAC. Advised to restart prior to cardiology appointment in May, given high risk of stroke. Patient reported increased minor bleeding in the past on Xarelto , so will switch to Eliquis .   Called patient to discuss initiating DOAC. Emphasized importance of continued adherence. He confirmed that he will be able to adhere to Eliquis  5 mg Q12H. He expresses that if experiences bothersome minor bleeding again, like he did with Xarelto  he will be hesitant to continue the medication. May need further education on risk/benefit of DOAC for stroke prevention. Sent 90 day supply (confirmed $4 copay through [facility]) to preferred pharmacy.   Patient confirmed that he started Ozempic  0.25 mg last Tuesday. Tolerating well - some indigestion, upset stomach, but willing to continue at this time to allow his body to adjust. Has not started checking BG, but will take readings prior to next telephone follow-up.   Arthea Larsson, PharmD PGY1 Pharmacy Resident

## 2023-11-10 ENCOUNTER — Telehealth: Payer: Self-pay | Admitting: *Deleted

## 2023-11-10 DIAGNOSIS — E222 Syndrome of inappropriate secretion of antidiuretic hormone: Secondary | ICD-10-CM

## 2023-11-10 NOTE — Progress Notes (Signed)
 Complex Care Management Note Care Guide Note  11/10/2023 Name: Rodney Kane MRN: [id number] DOB: 1976/01/02   Complex Care Management Outreach Attempts: An unsuccessful telephone outreach was attempted today to offer the patient information about available complex care management services.  Follow Up Plan:  Additional outreach attempts will be made to offer the patient complex care management information and services.   Encounter Outcome:  No Answer [name] Care Guide  Direct Dial:[phone] Fax:[phone] Website: [email or web address]

## 2023-11-10 NOTE — Progress Notes (Signed)
 Complex Care Management Note  Care Guide Note 11/10/2023 Name: Rodney Kane MRN: [id number] DOB: 1976/06/19  Rodney Kane is a 48 y.o. year old male who sees [name], NP for primary care. I reached out to [employer] by phone today to offer complex care management services.  [name] was given information about Complex Care Management services today including:   The Complex Care Management services include support from the care team which includes your Nurse Care Manager, Clinical Social Worker, or Pharmacist.  The Complex Care Management team is here to help remove barriers to the health concerns and goals most important to you. Complex Care Management services are voluntary, and the patient may decline or stop services at any time by request to their care team member.   Complex Care Management Consent Status: Patient agreed to services and verbal consent obtained.   Follow up plan:  Telephone appointment with complex care management team member scheduled for:  4/30 [facility] and LCSW 5/2  Encounter Outcome:  Patient Scheduled  Barnie Bora  [facility]  [facility], [facility] Guide  Direct Dial: [phone]  Fax [phone]

## 2023-11-11 ENCOUNTER — Telehealth: Payer: Self-pay | Admitting: *Deleted

## 2023-11-11 NOTE — Progress Notes (Signed)
 Complex Care Management Note Care Guide Note  11/11/2023 Name: Rodney Kane MRN: [id number] DOB: June 09, 1976  [name] is a 48 y.o. year old male who is a primary care patient of [name], NP . The community resource team was consulted for assistance with Food Insecurity and ssdi  SDOH screenings and interventions completed:  Yes  Social Drivers of Health From This Encounter   Housing: Unknown (11/11/2023)   Housing Stability Vital Sign    Unable to Pay for Housing in the Last Year: No    Homeless in the Last Year: No    SDOH Interventions Today    Flowsheet Row Most Recent Value  SDOH Interventions   Food Insecurity Interventions Community Resources Provided, AMB Referral  [Provided food banks and also app for food]  Housing Interventions Intervention Not Indicated        Care guide performed the following interventions: Patient provided with information about care guide support team and interviewed to confirm resource needs. Also told patient about upcoming appts with CM and LCSW  Follow Up Plan:  No further follow up planned at this time. The patient has been provided with needed resources.  Encounter Outcome:  Patient Visit Completed  Benuel Ly Greenauer-Moran  [place] [facility] HealthPopulation Health Care Guide  Direct Dial:[phone] [phone] Website: [email or web address]

## 2023-11-12 DIAGNOSIS — I96 Gangrene, not elsewhere classified: Secondary | ICD-10-CM | POA: Diagnosis not present

## 2023-11-12 DIAGNOSIS — Z7984 Long term (current) use of oral hypoglycemic drugs: Secondary | ICD-10-CM | POA: Diagnosis not present

## 2023-11-12 DIAGNOSIS — I872 Venous insufficiency (chronic) (peripheral): Secondary | ICD-10-CM | POA: Diagnosis not present

## 2023-11-12 DIAGNOSIS — R600 Localized edema: Secondary | ICD-10-CM | POA: Diagnosis not present

## 2023-11-12 DIAGNOSIS — I4891 Unspecified atrial fibrillation: Secondary | ICD-10-CM | POA: Diagnosis not present

## 2023-11-12 DIAGNOSIS — I129 Hypertensive chronic kidney disease with stage 1 through stage 4 chronic kidney disease, or unspecified chronic kidney disease: Secondary | ICD-10-CM | POA: Diagnosis not present

## 2023-11-12 DIAGNOSIS — N189 Chronic kidney disease, unspecified: Secondary | ICD-10-CM | POA: Diagnosis not present

## 2023-11-12 DIAGNOSIS — L97822 Non-pressure chronic ulcer of other part of left lower leg with fat layer exposed: Secondary | ICD-10-CM | POA: Diagnosis not present

## 2023-11-12 DIAGNOSIS — Z79899 Other long term (current) drug therapy: Secondary | ICD-10-CM | POA: Diagnosis not present

## 2023-11-12 DIAGNOSIS — E1122 Type 2 diabetes mellitus with diabetic chronic kidney disease: Secondary | ICD-10-CM | POA: Diagnosis not present

## 2023-11-12 DIAGNOSIS — Z794 Long term (current) use of insulin: Secondary | ICD-10-CM | POA: Diagnosis not present

## 2023-11-12 DIAGNOSIS — L929 Granulomatous disorder of the skin and subcutaneous tissue, unspecified: Secondary | ICD-10-CM | POA: Diagnosis not present

## 2023-11-12 DIAGNOSIS — L988 Other specified disorders of the skin and subcutaneous tissue: Secondary | ICD-10-CM | POA: Diagnosis not present

## 2023-11-12 DIAGNOSIS — L989 Disorder of the skin and subcutaneous tissue, unspecified: Secondary | ICD-10-CM | POA: Diagnosis not present

## 2023-11-12 DIAGNOSIS — I83028 Varicose veins of left lower extremity with ulcer other part of lower leg: Secondary | ICD-10-CM | POA: Diagnosis not present

## 2023-11-12 DIAGNOSIS — Z89022 Acquired absence of left finger(s): Secondary | ICD-10-CM | POA: Diagnosis not present

## 2023-11-12 DIAGNOSIS — Z6841 Body Mass Index (BMI) 40.0 and over, adult: Secondary | ICD-10-CM | POA: Diagnosis not present

## 2023-11-18 ENCOUNTER — Other Ambulatory Visit: Payer: Self-pay

## 2023-11-18 NOTE — Patient Outreach (Signed)
 Complex Care Management   Visit Note  11/18/2023  Name:  Rodney Kane MRN: [id number] DOB: Dec 16, 1975  Situation: Referral received for Complex Care Management related to  Diabetes 2, Wound  I obtained verbal consent from Patient.  Visit completed with patient  on the phone  Background:   Past Medical History:  Diagnosis Date   Coronavirus infection 06/2020   COVID 06/2020   Diabetes mellitus without complication (HCC)    Hemoglobin A1C greater than 9%, indicating poor diabetic control 12/2019   Hyperglycemia 12/2019   Hyperlipidemia 12/2019   Hypertension    Hypertensive urgency 12/2019   Noncompliance with medication regimen 12/2019   Nonischemic (hypertensive) dilated cardiomyopathy (HCC) 12/2016   EF has been 30 to 35%, recently lower.   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  he will take his BP once daily 1-2 hrs after BP meds and log.) Does patient have uncontrolled Hypertension?: No Cardiovascular Management Strategies: Medication therapy  Respiratory      Endocrine Patient reports the following symptoms related to hypoglycemia or hyperglycemia : No symptoms reported (Note from OV 10/01/23 A1C 13.6, improved from 08/21/23 A1C 15.0: GOAL <7%) Is patient diabetic?: Yes Is patient checking blood sugars at home?: No (He was given instructions from PharmD to take fasting once daily and log, encouraged him to do this.)    Gastrointestinal  Gastrointestinal Symptoms Reported: No symptoms reported      Genitourinary Genitourinary Symptoms Reported: No symptoms reported    Integumentary Integumentary Symptoms Reported: Wound (Being treated at [facility], next appt is 11/19/23 . He follows WC instructions to leave dressing intact after WC appt, clean/change bandage on Sunday until next treatment appt.) Skin Conditions: Wound Skin Management Strategies: Routine screening, Dressing changes Skin Self-Management Outcome: 4 (good)  Musculoskeletal Musculoskelatal Symptoms Reviewed: No symptoms reported   Falls in the past year?: No    Psychosocial Psychosocial Symptoms Reported: No symptoms reported            11/18/2023   10:58 AM  Depression screen PHQ 2/9  Decreased Interest 1  Down, Depressed, Hopeless 0  PHQ - 2 Score 1    There were no vitals filed for this visit.  Medications Reviewed Today   Medications were not reviewed in this encounter     Recommendation:   Specialty provider follow-up :W[facility] Care Center 11/19/23, Cardiology 12/07/23 This RNCM will place referral to BSW for contact information to [facility]   Follow Up Plan:   Face to Face follow up 12/02/23 at 10:15am.   Jurline Olmsted BSN, CCM [place]  [facility] Population Health RN Care Manager Direct Dial: [phone]  Fax: [phone]

## 2023-11-18 NOTE — Patient Instructions (Signed)
 Visit Information  Rodney Kane was given information about Medicaid Managed Care team care coordination services as a part of their [employer] Medicaid benefit. Rodney Kane verbally consentedto engagement with the [facility] Managed Care team.   If you are experiencing a medical emergency, please call 911 or report to your local emergency department or urgent care.   If you have a non-emergency medical problem during routine business hours, please contact your provider's office and ask to speak with a nurse.   For questions related to your [employer] [facility] health plan, please call: [phone]  OR visit the member homepage at: [email or web address]  If you would like to schedule transportation through your [employer] [facility] plan, please call the following number at least 2 days in advance of your appointment: [phone]  If you are experiencing a behavioral health crisis, call the [employer] [place]  Behavioral Health Crisis Line at [phone]). The line is available 24 hours a day, seven days a week.  If you would like help to quit smoking, call 1-800-QUIT-NOW (984-103-6593) OR Espaol: 1-855-Djelo-Ya (2-440-102-7253) o para ms informacin haga clic aqu or Text READY to 664-403 to register via text   Rodney Kane - following are the goals we discussed in your visit today:   Goals Addressed             This Visit's Progress    VBCI RN Care Plan       Problems:  Chronic Disease Management support and education needs related to DMII, HTN, and wound  Goal: Over the next 6 months the Patient will continue to work with RN Care Manager and/or Social Worker to address care management and care coordination needs related to DMII, HTN, and Wound as evidenced by adherence to care management team scheduled appointments     demonstrate Improved adherence to prescribed treatment  plan for DMII, HTN, and Wound as evidenced by decrease in A1C, blood pressure below 140/90, wound healing  Interventions:   Diabetes Interventions: Assessed patient's understanding of A1c goal: <7% Reviewed medications with patient and discussed importance of medication adherence Reviewed scheduled/upcoming provider appointments including: PCP on 01/14/24 Advised patient, providing education and rationale, to check cbg fasting once daily and record, calling PCP office for findings outside established parameters Screening for signs and symptoms of depression related to chronic disease state  Assessed social determinant of health barriers Last A1C =13.6% as per note from PCP office visit on 10/01/23, states A1C was taken in the office.   Lab Results  Component Value Date   HGBA1C 15.0 (A) 08/21/2023    Evaluation of current treatment plan related to  Wound on lower left leg ,     self-management and patient's adherence to plan as established by provider. Discussed plans with patient for ongoing care management follow up and provided patient with direct contact information for care management team Evaluation of current treatment plan related to Wound Care and patient's adherence to plan as established by provider Reviewed medications with patient and discussed dressing changes performed at home, he has instructions to leave dressing intact after WC appt, on Sunday, clean with special solution, apply lidocaine  cream, wrap the leg.  WC will then change again at next appointment.   Reviewed scheduled/upcoming provider appointments including next appt at Wound Center is 11/19/23  Hypertension Interventions: Last practice recorded BP readings:  BP Readings from Last 3 Encounters:  10/27/23 (!) 168/109  10/01/23 (!) 164/102  09/16/23 (!) 144/90   Most recent  eGFR/CrCl:  Lab Results  Component Value Date   EGFR 68 09/16/2023    No components found for: "CRCL"  Evaluation of current treatment  plan related to hypertension self management and patient's adherence to plan as established by provider Reviewed medications with patient and discussed importance of compliance Advised patient, providing education and rationale, to monitor blood pressure daily and record, calling PCP for findings outside established parameters Reviewed scheduled/upcoming provider appointments including:   Patient Self-Care Activities:  Attend all scheduled provider appointments Take BP once a day, at least 1-2 hours after taking BP meds, gave instructions to apply BP cuff, sit quietly for about 5 minutes, both feet on floor, back supported, then take BP & pulse, log.    Plan:  Telephone follow up appointment with care management team member scheduled for:  12/02/23 at 10:15am.              Please see education materials related to taking blood pressure provided by MyChart link.  Patient verbalizes understanding of instructions and care plan provided today and agrees to view in MyChart. Active MyChart status and patient understanding of how to access instructions and care plan via MyChart confirmed with patient.     Telephone follow up appointment with Managed Medicaid care management team member scheduled for:12/02/2023 at 10:15am.   Jurline Olmsted BSN, CCM [place]  [facility] Population Health RN Care Manager Direct Dial: [phone]  Fax: [phone]   Following is a copy of your plan of care:  There are no care plans that you recently modified to display for this patient.

## 2023-11-19 DIAGNOSIS — L97822 Non-pressure chronic ulcer of other part of left lower leg with fat layer exposed: Secondary | ICD-10-CM | POA: Diagnosis not present

## 2023-11-19 DIAGNOSIS — I4891 Unspecified atrial fibrillation: Secondary | ICD-10-CM | POA: Diagnosis not present

## 2023-11-19 DIAGNOSIS — E11622 Type 2 diabetes mellitus with other skin ulcer: Secondary | ICD-10-CM | POA: Diagnosis not present

## 2023-11-19 DIAGNOSIS — R600 Localized edema: Secondary | ICD-10-CM | POA: Diagnosis not present

## 2023-11-19 DIAGNOSIS — Z86718 Personal history of other venous thrombosis and embolism: Secondary | ICD-10-CM | POA: Diagnosis not present

## 2023-11-19 DIAGNOSIS — I872 Venous insufficiency (chronic) (peripheral): Secondary | ICD-10-CM | POA: Diagnosis not present

## 2023-11-20 ENCOUNTER — Other Ambulatory Visit: Payer: Self-pay | Admitting: Licensed Clinical Social Worker

## 2023-11-23 NOTE — Patient Outreach (Signed)
 Complex Care Management   Visit Note  11/20/2023  Name:  Rodney Kane MRN: [id number] DOB: 1975/12/15  Situation: Referral received for Complex Care Management related to  Stress  I obtained verbal consent from Patient.  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   Stroke [facility])    Urine ketones 12/2019   Vitamin D  deficiency 12/2019    Assessment: Patient Reported Symptoms:  Cognitive Cognitive Status: Alert and oriented to person, place, and time      Neurological Neurological Review of Symptoms: No symptoms reported    HEENT HEENT Symptoms Reported: No symptoms reported      Cardiovascular Cardiovascular Symptoms Reported: No symptoms reported Does patient have uncontrolled Hypertension?: No Cardiovascular Conditions: Heart failure Cardiovascular Management Strategies: Coping strategies, Medication therapy, Routine screening  Respiratory Respiratory Symptoms Reported: No symptoms reported    Endocrine Patient reports the following symptoms related to hypoglycemia or hyperglycemia : No symptoms reported Is patient diabetic?: Yes Is patient checking blood sugars at home?: No Endocrine Management Strategies: Medication therapy, Routine screening  Gastrointestinal Gastrointestinal Symptoms Reported: No symptoms reported      Genitourinary Genitourinary Symptoms Reported: No symptoms reported    Integumentary Integumentary Symptoms Reported: Wound Additional Integumentary Details: Patient reports  having a wound on left leg. He follows wound care in [place] Skin Conditions: Wound Skin Management Strategies: Routine screening  Musculoskeletal Musculoskelatal Symptoms Reviewed: No symptoms reported   Falls in the past year?: No Number of falls in past year: 1 or less Was there an injury with Fall?: No Fall Risk Category Calculator: 0 Patient Fall Risk Level: Low Fall Risk    Psychosocial Psychosocial Symptoms Reported: No symptoms reported   Major Change/Loss/Stressor/Fears (CP): Medical condition, self Quality of Family Relationships: involved, supportive Do you feel physically threatened by others?: No      11/18/2023   10:58 AM  Depression screen PHQ 2/9  Decreased Interest 1  Down, Depressed, Hopeless 0  PHQ - 2 Score 1    There were no vitals filed for this visit.  Medications Reviewed Today     Reviewed by Jen Eppinger D, LCSW (Social Worker) on 11/20/23 at 1521  Med List Status: <None>   Medication Order Taking? Sig Documenting Provider Last Dose Status Informant  amLODipine  (NORVASC ) 5 MG tablet [id number] Yes Take 1 tablet (5 mg total) by mouth daily. [name], NP Taking Active   apixaban  (ELIQUIS ) 5 MG TABS tablet [id number] Yes Take 1 tablet (5 mg total) by mouth 2 (two) times daily. [name], NP Taking Active   atorvastatin  (LIPITOR ) 40 MG tablet [id number] Yes Take 1 tablet (40 mg total) by mouth daily. [name], NP Taking Active   Blood Glucose Monitoring Suppl DEVI [id number] Yes 1 each by Does not apply route in the morning, at noon, and at bedtime. May substitute to any manufacturer covered by patient's insurance. [name], NP Taking Active            Med Note [name], [facility]  R   Tue Aug 25, 2023  1:04 PM) * did not pick up the new glucometer  insulin  glargine (LANTUS ) 100 UNIT/ML injection [id number] Yes Inject 0.16 mLs (16 Units total) into the skin daily. May increase up to 30 units once daily as directed by your provider.  [name], NP Taking Active            Med Note Francetta Innocent, LAYNA P   Tue Oct 27, 2023  2:34 PM) Taking 16 units daily  Insulin  Pen Needle (PEN NEEDLES) 32G X 4 MM MISC [id number] Yes Use as directed to inject insulin  once daily Nichols, Tonya S, NP Taking Active   losartan  (COZAAR ) 50 MG tablet [id number] Yes Take 1 tablet (50 mg total) by mouth daily. [name], NP Taking Active   metFORMIN  (GLUCOPHAGE -XR) 500 MG 24 hr tablet [id number] Yes Take 1 tablet (500 mg total) by mouth 2 (two) times daily with a meal. [name], NP Taking Active            Med Note [name], [facility] R   Tue Aug 25, 2023  1:11 PM) Per patient he picked up yesterday  metoprolol  succinate (TOPROL -XL) 50 MG 24 hr tablet [id number] Yes Take 1 tablet (50 mg total) by mouth daily. Take with or immediately following a meal. [name], NP Taking Active   mupirocin  ointment (BACTROBAN ) 2 % [id number] No Apply 1 Application topically 2 (two) times daily.  Patient not taking: Reported on 11/20/2023   [name], NP Not Taking Active   Semaglutide ,0.25 or 0.5MG /DOS, 2 MG/3ML SOPN [id number] Yes Inject 0.25 mg into the skin once a week. For four weeks then increase to 0.5 on the fifth week and going forward [name], NP Taking Active            Med Note Adra Alanis   Mon Nov 09, 2023 11:46 AM) Has taken one dose (0.25 mg) so far, next dose due tomorrow            Recommendation:   Continue utilizing strategies discussed to assist with symptom management  Follow Up Plan:   Telephone follow-up 2-4 weeks  Alease Hunter, LCSW [facility]  [facility], [facility] Clinical Social Worker Direct Dial: [phone]  Fax: [phone] Website: [email or web address] 5:32 PM

## 2023-11-23 NOTE — Patient Instructions (Signed)
 Visit Information  [name] was given information about Medicaid Managed Care team care coordination services as a part of their [employer] Medicaid benefit. Rodney Kane verbally consentedto engagement with the [facility] Managed Care team.   If you are experiencing a medical emergency, please call 911 or report to your local emergency department or urgent care.   If you have a non-emergency medical problem during routine business hours, please contact your provider's office and ask to speak with a nurse.   For questions related to your [employer] [facility] health plan, please call: [phone]  OR visit the member homepage at: [email or web address]  If you would like to schedule transportation through your [employer] [facility] plan, please call the following number at least 2 days in advance of your appointment: [phone]  If you are experiencing a behavioral health crisis, call the [employer]   Behavioral Health Crisis Line at [phone] [phone]). The line is available 24 hours a day, seven days a week.  If you would like help to quit smoking, call 1-800-QUIT-NOW ((347)330-3700) OR Espaol: 1-855-Djelo-Ya (4-259-563-8756) o para ms informacin haga clic aqu or Text READY to 433-295 to register via text   [name] - following are the goals we discussed in your visit today:    Goals Addressed             This Visit's Progress    LCSW VBCI Social Work Care Plan   On track    Problems:   Stress  CSW Clinical Goal(s):   Over the next 60 days the Patient will attend all scheduled medical appointments as evidenced by patient report and care team review of appointment completion in electronic MEDICAL RECORD NUMBER  demonstrate a reduction in symptoms related to Stress at applying for Disabilty .  Interventions:  Mental Health:  Evaluation of current treatment plan  related to Stress at applying for Disability Active listening / Reflection utilized Financial risk analyst / information provided Emotional Support Provided Solution-Focued Strategies employed:  Patient Goals/Self-Care Activities:  Increase coping skills and healthy habits  Plan:   Telephone follow up appointment with care management team member scheduled for:  2 weeks        Please see education materials related to topics discussed provided by MyChart link.  Patient verbalizes understanding of instructions and care plan provided today and agrees to view in MyChart. Active MyChart status and patient understanding of how to access instructions and care plan via MyChart confirmed with patient.     Licensed Clinical Social Worker will 5/16 at 12:30 PM  Alease Hunter, LCSW [place]  [facility], [facility] Clinical Social Worker Direct Dial: [phone]  Fax: [phone] Website: [email or web address] 5:33 PM   Following is a copy of your plan of care:  There are no care plans that you recently modified to display for this patient.

## 2023-11-26 DIAGNOSIS — I4891 Unspecified atrial fibrillation: Secondary | ICD-10-CM | POA: Diagnosis not present

## 2023-11-26 DIAGNOSIS — Z86718 Personal history of other venous thrombosis and embolism: Secondary | ICD-10-CM | POA: Diagnosis not present

## 2023-11-26 DIAGNOSIS — R600 Localized edema: Secondary | ICD-10-CM | POA: Diagnosis not present

## 2023-11-26 DIAGNOSIS — L97822 Non-pressure chronic ulcer of other part of left lower leg with fat layer exposed: Secondary | ICD-10-CM | POA: Diagnosis not present

## 2023-11-26 DIAGNOSIS — E11622 Type 2 diabetes mellitus with other skin ulcer: Secondary | ICD-10-CM | POA: Diagnosis not present

## 2023-11-26 DIAGNOSIS — I872 Venous insufficiency (chronic) (peripheral): Secondary | ICD-10-CM | POA: Diagnosis not present

## 2023-12-01 ENCOUNTER — Other Ambulatory Visit

## 2023-12-02 ENCOUNTER — Other Ambulatory Visit: Payer: Self-pay

## 2023-12-02 NOTE — Patient Instructions (Signed)
 Visit Information  Here is a list of dental offices that possibly take [employer] Medicaid:  [facility]  [address], [place], [place] [address]  [phone]  [facility] [place] [address] [place], [place] [address] [phone] [facility] Dept of Social Services [address] [place], [place] [address]: [phone]  [facility]  [address] [place], [place] [address]   [phone] [facility] Monique Ano, DDS.  [address] [place], [place] [address]  [phone] [facility] [place] [address] [place], [place] [address] [phone]         Rodney Kane was given information about Medicaid Managed Care team care coordination services as a part of their [employer] Medicaid benefit. Rodney Kane verbally consentedto engagement with the [facility] Managed Care team.   If you are experiencing a medical emergency, please call 911 or report to your local emergency department or urgent care.   If you have a non-emergency medical problem during routine business hours, please contact your provider's office and ask to speak with a nurse.   For questions related to your [employer] [facility] health plan, please call: [phone]  OR visit the member homepage at: [email or web address]  If you would like to schedule transportation through your [facility] plan, please call the following number at least 2 days in advance of your appointment: [phone]  If you are experiencing a behavioral health crisis, call the [employer] [place]  Behavioral Health Crisis Line at [phone] 6281317253). The line is available 24 hours a day, seven days a week.  If you would like help to quit smoking, call 1-800-QUIT-NOW (272-818-2477) OR Espaol: 1-855-Djelo-Ya (6-269-485-4627) o para ms informacin haga clic aqu or Text  READY to 200-400 to register via text   Rodney Kane - following are the goals we discussed in your visit today:    Goals Addressed   None     Please see education materials related to blood glucose monitoring provided by MyChart link.  Patient verbalizes understanding of instructions and care plan provided today and agrees to view in MyChart. Active MyChart status and patient understanding of how to access instructions and care plan via MyChart confirmed with patient.     Telephone follow up appointment with Managed Medicaid care management team member scheduled for:12/16/23 at 11:30am.   Jurline Olmsted BSN, CCM [place]  [facility] Population Health RN Care Manager Direct Dial: [phone]  Fax: [phone]   Following is a copy of your plan of care:  There are no care plans that you recently modified to display for this patient.

## 2023-12-02 NOTE — Patient Outreach (Addendum)
 Complex Care Management   Visit Note  12/02/2023  Name:  Rodney Kane MRN: [id number] DOB: 15-Jul-1976  Situation: Referral received for Complex Care Management related to Heart Failure, Diabetes with Complications, and Wound to left lower leg I obtained verbal consent from Patient.  Visit completed with patient  on the phone  Background:   Past Medical History:  Diagnosis Date   Coronavirus infection 06/2020   COVID 06/2020   Diabetes mellitus without complication (HCC)    Hemoglobin A1C greater than 9%, indicating poor diabetic control 12/2019   Hyperglycemia 12/2019   Hyperlipidemia 12/2019   Hypertension    Hypertensive urgency 12/2019   Noncompliance with medication regimen 12/2019   Nonischemic (hypertensive) dilated cardiomyopathy (HCC) 12/2016   EF has been 30 to 35%, recently lower.   Stroke 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 Cardiovascular Conditions: Hypertension, High blood cholesterol, Heart failure Cardiovascular Management Strategies: Medication therapy, Routine screening  Respiratory Respiratory Symptoms Reported: No symptoms reported    Endocrine Patient reports the following symptoms related to hypoglycemia or hyperglycemia : No symptoms reported  (He admits to not taking his blood sugars as ordered, encouraged to start at least taking fasting blood sugars every morning and logging.) Is patient diabetic?: Yes Is patient checking blood sugars at home?: No    Gastrointestinal Gastrointestinal Symptoms Reported: No symptoms reported      Genitourinary Genitourinary Symptoms Reported: No symptoms reported    Integumentary Integumentary Symptoms Reported: Wound Additional Integumentary Details: Being treated at [facility], next appt is 12/03/23.  He follows WC instructions for dressing changes. Skin Conditions: Wound Skin Management Strategies: Medication therapy, Routine screening Skin Self-Management Outcome: 4 (good)  Musculoskeletal Musculoskelatal Symptoms Reviewed: No symptoms reported   Falls in the past year?: No    Psychosocial Psychosocial Symptoms Reported: No symptoms reported            11/18/2023   10:58 AM  Depression screen PHQ 2/9  Decreased Interest 1  Down, Depressed, Hopeless 0  PHQ - 2 Score 1    There were no vitals filed for this visit.  Medications Reviewed Today   Medications were not reviewed in this encounter     Recommendation:   -Specialty provider follow-up : Cardiology appt scheduled 12/07/23 with Dr. Addie Holstein - patient will take blood pressures over the next week to provide to Dr. Addie Holstein (BP's have recently been elevated at Wound Care office visits). Reports he is taking amlodipine , Eliquis , atorvastatin , metoprolol  as prescribed.  -Patient will take fasting blood sugars over the next two weeks and log. Reports he is taking metformin , Ozempic , and Lantus  as prescribed. Not currently taking blood sugars.   -Educated on risk for stroke and heart attack for prolonged elevated blood pressures.  Taking BP's at home and keeping log with show Cardiologist BP's within  home environment.   -Has phone appt with Alease Hunter LCSW on 12/04/23 to further discuss assistance  completing disability paperwork.  -Provided dental contacts to make appointment.    Follow Up Plan:   Telephone follow-up two weeks with [name] [facility]  Jurline Olmsted BSN, CCM [place]  [facility] Population Health RN Care Manager Direct Dial: [phone]  Fax: [phone]

## 2023-12-03 DIAGNOSIS — I872 Venous insufficiency (chronic) (peripheral): Secondary | ICD-10-CM | POA: Diagnosis not present

## 2023-12-03 DIAGNOSIS — Z86718 Personal history of other venous thrombosis and embolism: Secondary | ICD-10-CM | POA: Diagnosis not present

## 2023-12-03 DIAGNOSIS — E11622 Type 2 diabetes mellitus with other skin ulcer: Secondary | ICD-10-CM | POA: Diagnosis not present

## 2023-12-03 DIAGNOSIS — I4891 Unspecified atrial fibrillation: Secondary | ICD-10-CM | POA: Diagnosis not present

## 2023-12-03 DIAGNOSIS — R600 Localized edema: Secondary | ICD-10-CM | POA: Diagnosis not present

## 2023-12-03 DIAGNOSIS — L97822 Non-pressure chronic ulcer of other part of left lower leg with fat layer exposed: Secondary | ICD-10-CM | POA: Diagnosis not present

## 2023-12-04 ENCOUNTER — Other Ambulatory Visit: Payer: Self-pay | Admitting: Licensed Clinical Social Worker

## 2023-12-04 DIAGNOSIS — N182 Chronic kidney disease, stage 2 (mild): Secondary | ICD-10-CM

## 2023-12-04 DIAGNOSIS — I480 Paroxysmal atrial fibrillation: Secondary | ICD-10-CM

## 2023-12-04 DIAGNOSIS — Z794 Long term (current) use of insulin: Secondary | ICD-10-CM

## 2023-12-04 DIAGNOSIS — I5022 Chronic systolic (congestive) heart failure: Secondary | ICD-10-CM

## 2023-12-07 ENCOUNTER — Ambulatory Visit: Attending: Cardiology | Admitting: Cardiology

## 2023-12-07 ENCOUNTER — Encounter: Payer: Self-pay | Admitting: Cardiology

## 2023-12-07 VITALS — BP 156/84 | HR 107 | Ht 73.0 in | Wt 336.8 lb

## 2023-12-07 DIAGNOSIS — R000 Tachycardia, unspecified: Secondary | ICD-10-CM | POA: Diagnosis not present

## 2023-12-07 DIAGNOSIS — I10 Essential (primary) hypertension: Secondary | ICD-10-CM | POA: Diagnosis not present

## 2023-12-07 DIAGNOSIS — D6869 Other thrombophilia: Secondary | ICD-10-CM | POA: Diagnosis not present

## 2023-12-07 DIAGNOSIS — I513 Intracardiac thrombosis, not elsewhere classified: Secondary | ICD-10-CM

## 2023-12-07 DIAGNOSIS — I5022 Chronic systolic (congestive) heart failure: Secondary | ICD-10-CM | POA: Diagnosis not present

## 2023-12-07 DIAGNOSIS — E1169 Type 2 diabetes mellitus with other specified complication: Secondary | ICD-10-CM | POA: Diagnosis not present

## 2023-12-07 DIAGNOSIS — Z794 Long term (current) use of insulin: Secondary | ICD-10-CM | POA: Insufficient documentation

## 2023-12-07 DIAGNOSIS — E118 Type 2 diabetes mellitus with unspecified complications: Secondary | ICD-10-CM | POA: Diagnosis not present

## 2023-12-07 DIAGNOSIS — I428 Other cardiomyopathies: Secondary | ICD-10-CM

## 2023-12-07 DIAGNOSIS — I480 Paroxysmal atrial fibrillation: Secondary | ICD-10-CM

## 2023-12-07 DIAGNOSIS — E785 Hyperlipidemia, unspecified: Secondary | ICD-10-CM | POA: Insufficient documentation

## 2023-12-07 MED ORDER — LOSARTAN POTASSIUM 100 MG PO TABS: 100.0000 mg | ORAL_TABLET | Freq: Every day | ORAL | 3 refills | Status: DC

## 2023-12-07 MED ORDER — METOPROLOL SUCCINATE ER 100 MG PO TB24
100.0000 mg | ORAL_TABLET | Freq: Every day | ORAL | 3 refills | Status: AC
Start: 2023-12-07 — End: ?

## 2023-12-07 NOTE — Patient Instructions (Addendum)
 Medication Instructions:   INCREASE LOSARTAN  100 MG DAILY   INCREASE METOPROLOL  SUCCINATE 100 MG    *If you need a refill on your cardiac medications before your next appointment, please call your pharmacy*   Lab Work:  NOT NEED   Testing/Procedures: NOT NEEDED   Follow-Up: At [facility], you and your health needs are our priority.  As part of our continuing mission to provide you with exceptional heart care, we have created designated Provider Care Teams.  These Care Teams include your primary Cardiologist (physician) and Advanced Practice Providers (APPs -  Physician Assistants and Nurse Practitioners) who all work together to provide you with the care you need, when you need it.     Your next appointment:   5 month(s)  The format for your next appointment:   In Person  Provider:   Randene Bustard, MD   Other Instructions   CONTACT OFFICE IF YOU NOTICE MORE SHORTNESS OF BREATH OR DIFFICULTY BREATHING WHEN LAYING DOWN

## 2023-12-07 NOTE — Progress Notes (Signed)
 Cardiology Office Note:  .   Date:  12/10/2023  ID:  Rodney Kane, DOB [birth year], MRN [id number] PCP: [name], NP  [facility] Providers Cardiologist:  Randene Bustard, MD     Chief Complaint  Patient presents with   Follow-up    Delayed follow-up.  No longer on diuretic.   Atrial Fibrillation    No notable symptoms for A-fib.    Patient Profile: .     Rodney Kane is a morbidly obese 48 y.o. 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  My first and only clinic visit with this gentleman was in February 2022 having been lost to follow-up since his hospitalization initially in December 2021 (and I had not seen him since 2018). Initial evaluation in June 2018 showed EF 30 to 35% t=> cardiac catheter revealed angiographically normal coronary arteries normal PA pressures with a wedge pressure of 15 after diuresis. Follow evaluation in December 2021 his EF is 20 to 25% with global hypokinesis. At this point he had significant lower extremity edema but did respond to high-dose Lasix  but the weight came right back again.  He noted orthopnea and PND.  He had not been taking Lasix  routinely nor was he taking spironolactone .  I increased losartan  and then had a quick course of higher dose Lasix      Rodney Kane was last seen on May 30, 2021 by Lovette Rud, PA.  (Prior to that he had been seen by 2 other APP's.  He was doing well back to work at [employer] second shift.  Was taking a whole dose of the spironolactone  25 mg daily.  Taking torsemide  20 mg PRN.  Maybe once every couple days (3 times a week).  No edema.  Working on monitoring sugar and salt intake.  Plan was to reassess Echo as  he was still on Xarelto .  Was referred to the PrEP program at the [facility]  Subjective  Discussed the use of AI scribe software for clinical note transcription with the patient, who gave verbal consent to proceed.  History of Present Illness History of Present Illness Rodney Kane is a 48 year old male with heart failure and atrial fibrillation who presents for follow-up of his cardiac condition.  He experiences nightly shortness of breath that wakes him up. He denies recent leg swelling, although he mentions a wound on his leg that sometimes causes swelling. He is no longer on diuretics such as furosemide  or torsemide .  He has a history of heart failure with a previous echocardiogram in March 2022 showing a pump function of less than 20% and a clot in the heart. By November 2022, his pump function was reported as 30-35%, and the most recent echocardiogram in August 2023 showed a pump function of 45-50%.  He is currently taking amlodipine  5 mg, losartan  50 mg, metoprolol  50 mg, and Lipitor  40 mg for his cardiac conditions. He also takes Eliquis  twice daily for atrial fibrillation and a history of stroke.  No longer on diuretic.  For diabetes management, he is on semaglutide  injections weekly and metformin  500 mg twice daily. He also uses insulin , specifically Lantus , at 16 units in the evening.  He experiences blurred vision and occasional palpitations  described as an 'adrenaline rush' with a fast but regular heartbeat, lasting about an hour, occurring last week.  His blood pressure has been around the 150s, and he notes that his medications were adjusted by a pharmacist in February or March 2025, which included the addition of amlodipine .    Objective  Medications - Amlodipine  5 mg; - Losartan  50 mg; - Metoprolol  succinate 50 mg - Semaglutide  injection (Ozempic ) every week - Metformin  500 mg 1 tablet twice a day - Lipitor  40 mg - Eliquis  5 mg twice daily - Lantus  16 units nightly;  metformin  500 mg twice daily; semaglutide  0.25 mg injection with plans to increase to 0.5 mg. - PRN tadalafil.  Studies Reviewed: Aaron Aas   EKG Interpretation Date/Time:  Monday Dec 07 2023 15:57:12 EDT Ventricular Rate:  107 PR Interval:  166 QRS Duration:  86 QT Interval:  340 QTC Calculation: 453 R Axis:   -15  Text Interpretation: Sinus tachycardia Septal infarct (cited on or before 14-Feb-2023) When compared with ECG of 14-Feb-2023 14:05, Questionable change in QRS axis Nonspecific T wave abnormality, improved in Inferior leads Confirmed by Randene Bustard ([id number]) on 12/07/2023 4:14:43 PM    ECHO 02/19/2022: EF improved to 45-50%.  Mildly decreased function with global apical hypokinesis.  GR 1 DD.  Normal R RV with normal RV P/PAP and RAP.  Trivial MR but otherwise normal valves.  No LV thrombus Echo 06/10/2021: EF 30 to 35% with global hypokinesis.  Moderately dilated LV.  Mild LVH.  Indeterminate diastolic pressures.  Moderate LA dilation.  Mildly enlarged RV.  Thoracic aorta measured 41 mm.  LV apical thrombus not noted  Lab Results  Component Value Date   CHOL 132 05/30/2021   HDL 50 05/30/2021   LDLCALC 71 05/30/2021   TRIG 49 05/30/2021   CHOLHDL 2.6 05/30/2021   Lab Results  Component Value Date   NA 139 09/16/2023   K 5.3 (H) 09/16/2023   CREATININE 1.30 (H) 09/16/2023   EGFR 68 09/16/2023   GLUCOSE 298 (H) 09/16/2023   Lab Results  Component Value Date   WBC 6.7 09/16/2023   HGB 14.4 09/16/2023   HCT 45.2 09/16/2023   MCV 81 09/16/2023   PLT 284 09/16/2023   Lab Results  Component Value Date   HGBA1C 15.0 (A) 08/21/2023   Risk Assessment/Calculations:          Physical Exam:   VS:  BP (!) 156/84 (BP Location: Left Arm, Patient Position: Sitting, Cuff Size: Large)   Pulse (!) 107   Ht 6\' 1"  (1.854 m)   Wt (!) 336 lb 12.8 oz (152.8 kg)   SpO2 98%   BMI 44.44 kg/m   => plan to treat BP is to increase Toprol  to 100 mg daily and losartan  100 mg daily.  Would  then consider spironolactone  next. Wt Readings from Last 3 Encounters:  12/07/23 (!) 336 lb 12.8 oz (152.8 kg)  10/01/23 (!) 341 lb (154.7 kg)  09/16/23 (!) 330 lb 3.2 oz (149.8 kg)    GEN: Well nourished, well groomed in no acute distress; morbidly obese NECK: No JVD; No carotid bruits CARDIAC: Normal S1, S2; RRR, no murmurs, rubs, gallops RESPIRATORY:  Clear to auscultation without rales, wheezing or rhonchi ; nonlabored, good air movement. ABDOMEN: Soft, non-tender, non-distended EXTREMITIES: Trivial edema; No deformity     ASSESSMENT AND PLAN: .    Problem List Items Addressed This Visit       Cardiology Problems   Chronic heart failure  with mildly reduced ejection fraction (HFmrEF, 41-49%) (HCC) - Primary (Chronic)   Despite intermittently being lost to follow-up, his EF has improved with optimization of medical management.  EF improved from 20 to 25% to now 40 to 45%. No symptoms of orthopnea or edema, although he does have some PND symptoms.  - Increase losartan  to 100 mg daily. - Increase metoprolol  succinate to 100 mg daily. - Monitor for symptoms of orthopnea, edema, or worsening dyspnea. - Consider spironolactone  if symptoms worsen or if BP remains elevated. - Low threshold to consider restarting as needed Lasix       Relevant Medications   tadalafil (CIALIS) 20 MG tablet   losartan  (COZAAR ) 100 MG tablet   metoprolol  succinate (TOPROL -XL) 100 MG 24 hr tablet   Essential hypertension (Chronic)   At inadequate control blood pressure today. -> Increase both losartan  and metoprolol  succinate to 100 mg daily. -Continue amlodipine  5 mg daily -If pressure still elevated would add spironolactone  5 mg daily.      Relevant Medications   tadalafil (CIALIS) 20 MG tablet   losartan  (COZAAR ) 100 MG tablet   metoprolol  succinate (TOPROL -XL) 100 MG 24 hr tablet   Hypercoagulable state due to paroxysmal atrial fibrillation (HCC) (Chronic)   This patients CHA2DS2-VASc Score and  unadjusted Ischemic Stroke Rate (% per year) is equal to 7.2 % stroke rate/year from a score of 5  Above score calculated as 1 point each if present [CHF, HTN, DM, Vascular=MI/PAD/Aortic Plaque, Age if 65-74, or Male] Above score calculated as 2 points each if present [Age > 75, or Stroke/TIA/TE]   Remains on Eliquis  5 mg twice daily; also covering history of LV thrombus => At this point has been long enough since LV thrombus to be acceptable to hold Eliquis  2 to 3 days preop for surgeries or procedures without bridging.       Relevant Medications   tadalafil (CIALIS) 20 MG tablet   losartan  (COZAAR ) 100 MG tablet   metoprolol  succinate (TOPROL -XL) 100 MG 24 hr tablet   Hyperlipidemia associated with type 2 diabetes mellitus (HCC) (Chronic)   Managed with atorvastatin  40 mg daily..  Last cholesterol check in 2022. - Order lipid panel at next PCP visit in June 2023.      Relevant Medications   tadalafil (CIALIS) 20 MG tablet   losartan  (COZAAR ) 100 MG tablet   metoprolol  succinate (TOPROL -XL) 100 MG 24 hr tablet   LV (left ventricular) mural thrombus (Chronic)   Follow-up echocardiograms had shown resolution of thrombus and improved EF.  He however remains on Eliquis  for A-fib.      Relevant Medications   tadalafil (CIALIS) 20 MG tablet   losartan  (COZAAR ) 100 MG tablet   metoprolol  succinate (TOPROL -XL) 100 MG 24 hr tablet   Nonischemic cardiomyopathy (HCC) (Chronic)   Previous right catheterization showed no evidence of CAD with significant reduced EF.  EF has improved some but not fully back to baseline. Currently NYHA class II symptoms.  Has some orthopnea and exertional dyspnea but well-controlled edema and no orthopnea. Not currently on any diuretic.  Would like to go to convert from ARB to Entresto, but need to ensure adequate coverage. -Increase losartan  to 100 mg daily, increase Toprol  to 100 mg daily. -In follow-up due to reassess blood pressures and would next add  spironolactone . -Continue semaglutide , but would also potentially consider SGLT2 inhibitor.      Relevant Medications   tadalafil (CIALIS) 20 MG tablet   losartan  (COZAAR ) 100 MG tablet  metoprolol  succinate (TOPROL -XL) 100 MG 24 hr tablet   Paroxysmal atrial fibrillation (HCC) (Chronic)   Atrial fibrillation with previous stroke.  He is having intermittent episodes of rapid heartbeats but has not necessarily noted irregular heartbeat to suggest A-fib.   Continued anticoagulation necessary. - Continue Eliquis  (apixaban ) 5 mg twice daily. -Increase Toprol  to 100 mg daily - If tachycardia episodes becomes worse, would consider event monitor to determine A-fib burden.      Relevant Medications   tadalafil (CIALIS) 20 MG tablet   losartan  (COZAAR ) 100 MG tablet   metoprolol  succinate (TOPROL -XL) 100 MG 24 hr tablet     Other   Sinus tachycardia   He does feel rapid heart rate episodes-will increase Toprol  to 100 mg daily      Relevant Orders   EKG 12-Lead (Completed)   Type 2 diabetes mellitus with complication, with long-term current use of insulin  (HCC) (Chronic)   Managed by PCP.  Currently on Lantus , metformin  and semaglutide . -With reduced EF, would consider adding SGLT2 inhibitor - Recheck A1c and blood glucose levels at next PCP visit.      Relevant Medications   losartan  (COZAAR ) 100 MG tablet        Follow-Up: Return in about 5 months (around 05/08/2024).      Signed, Arleen Lacer, MD, MS Randene Bustard, M.D., M.S. Interventional Chartered certified accountant  Pager # [phone]

## 2023-12-08 ENCOUNTER — Other Ambulatory Visit: Payer: Self-pay

## 2023-12-08 DIAGNOSIS — E118 Type 2 diabetes mellitus with unspecified complications: Secondary | ICD-10-CM

## 2023-12-08 MED ORDER — LANTUS SOLOSTAR 100 UNIT/ML ~~LOC~~ SOPN: 20.0000 [IU] | PEN_INJECTOR | Freq: Every day | SUBCUTANEOUS | Status: DC

## 2023-12-08 MED ORDER — SEMAGLUTIDE(0.25 OR 0.5MG/DOS) 2 MG/3ML ~~LOC~~ SOPN: 0.5000 mg | PEN_INJECTOR | SUBCUTANEOUS | 2 refills | Status: DC

## 2023-12-08 NOTE — Progress Notes (Signed)
 12/08/2023 Name: Rodney Kane MRN: [id number] DOB: 1976/04/16  Chief Complaint  Patient presents with   Diabetes    Rodney Kane is a 48 y.o. year old male who was referred for medication management by their primary care provider, [name], NP. They presented for a face to face visit today.   They were referred to the pharmacist by their PCP for assistance in managing diabetes. PMH includes HF (EF 45-50% in Aug 2023, LV global hypokinesis, G1DD), atrial fibrillation, HTN, CVA (2021), hx of VTE (2022), T2DM. Patient is following with complex care management for transportation needs, food insecurity, financial difficulties related to housing, and applying for disability. 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   Subjective:  Patient was initially outreached on 08/25/23 by Alexandria Angel, PharmD after patient had recently been seen in the ED 2/2 hyperglycemia and cellulitis. Pt reported adherence to glipizide  5 mg daily and metformin  500 mg BID, but also reported self-treating with an old Rx of Novolog  - of which he would take 10 units once daily PRN for symptoms of hyperglycemia. He was scheduled for an in person appt for a medication review.  Patient was seen in the ED again on 09/02/23 for a wound on his LLE (ankle) - no evidence of osteomyelitis, was discharged with cephalexin  and SMX/TMP. He also received a dose of CFX in the ED. At in-person pharmacy appointment with me on 09/08/23, patient brought all his medication bottles and glucometer. Random BG were ranging 200-300s, with symptomatic hyperglycemia. Initiated insulin  glargine (Lantus ) 16 units daily and increased losartan  to 50 mg daily for elevated BP in the setting of HFrEF. Also restarted atorvastatin . Patient has followed up with his PCP several times since then for assessment of his wound, and he is now following with [facility] for wound care. At last pharmacy appointment in -person on 10/27/23, we  initiated Ozempic , continued Lantus  and metformin , and stopped glipizide . Also recommended to start amlodipine  5 mg daily for elevated BP. Patient was seen by cardiology on 12/06/23. BP was still elevated and he was instructed to increase losartan  and metoprolol . He has also been reinitiated on a DOAC for stroke prevention given Afib dx.   Today patient was outreached for a telephone visit. He confirmed his full name and DOB. He reports he is doing well. He is taking all his medications as prescribed. He had his medication bottles available for review. He also had his glucometer with him. He denies missed doses of his medications. He reports that he is having diarrhea since increasing Ozempic  to 0.5 mg weekly. He is having this happens 2-3 times/day 1-2 times/week. He does not feel that the symptoms are related to intake of fatty, greasy, or high-sugar foods. He confirmed that he discontinued glipizide  as recommended at our previous visit. Patient denied issues with bleeding or bruising since starting Eliquis .   Care Team: Primary Care Provider: [name], NP ; Next Scheduled Visit: 01/14/24 Cardiology: Dr. Addie Holstein - no follow-up scheduled  Medication Access/Adherence  Current Pharmacy:  [facility] [address], [place] - [address] [place] [address] Phone: [phone] Fax: [phone]   Patient reports affordability concerns with their medications: Yes  Patient reports access/transportation concerns to their pharmacy: No  Patient reports adherence concerns with their medications:  No  - denies missed doses. Fill history appears appropriate today  Patient has been connected with Medicare Complex Care Management and SW for assistance  applying for disability given residual deficits with speech.   Diabetes:  Current medications: metformin  500 XR BID, Lantus  (insulin  glargine) 16 units daily (~1PM), Ozempic  0.5 mg once weekly Medications  tried in the past: Novolog , sitagliptin , and linagliptin  in the past  Current glucose readings:  230, 232, 218, 236, 243 (reports that these are all post-prandial BG) Using AccuChek Guide meter  Patient endorses one episode of symptoms of hypoglycemia last week - felt shaky, dizzy. Checked his blood sugar and it was in the 200s.  Denies polydipsia, polyphagia, nocturia, blurred vision.   Current meal patterns: Typically 1-2 meals/day around12-1PM and then in the evening 6-8PM. Reports that some days he does not eat any meals, but instead snacks throughout the day (like multiple granola bars). Trying to reduce drinking regular sodas, and trying to drink more water and diet/sugar free options.  - Reports that it is difficult for him to cut out sweets. He will go a couple days without eating sweets, but then will eat a lot at one time.  - Lunch: not consistent, varies - Supper: varies - Drinks water all throughout the day, has switched to diet/sugar-free sodas, koolaid punch zero sugar, minutemaid zero sugar drinks  Current physical activity: occasional walking, has a "shake plate" at home (core exercise). No other strength training reported.   Current medication access support: insurance - Medicaid  Hypertension:  Current medications: losartan  100 mg PO daily, metoprolol  100 mg daily Medications previously tried: furosemide  40 mg daily PRN (he was instructed to stop this at pharmacy telephone appt on 08/25/23 as it was an old Rx), hydralazine   Patient has a validated, automated, upper arm home BP cuff.  Current blood pressure readings readings: did not discuss today  Objective:  BP Readings from Last 3 Encounters:  12/07/23 (!) 156/84  10/27/23 (!) 168/109  10/01/23 (!) 164/102   Lab Results  Component Value Date   HGBA1C 15.0 (A) 08/21/2023   Per PCP note on 10/01/23 - in-office A1c was 13.6%, though does not appear that this was entered into order results.   eGFR > 60 mL/min, UACR  10/01/23: 4 mg/g  Lab Results  Component Value Date   CREATININE 1.30 (H) 09/16/2023   BUN 14 09/16/2023   NA 139 09/16/2023   K 5.3 (H) 09/16/2023   CL 99 09/16/2023   CO2 25 09/16/2023    Lab Results  Component Value Date   CHOL 132 05/30/2021   HDL 50 05/30/2021   LDLCALC 71 05/30/2021   TRIG 49 05/30/2021   CHOLHDL 2.6 05/30/2021    Medications Reviewed Today     Reviewed by Adra Alanis, RPH (Pharmacist) on 12/08/23 at 1809  Med List Status: <None>   Medication Order Taking? Sig Documenting Provider Last Dose Status Informant  amLODipine  (NORVASC ) 5 MG tablet [id number] Yes Take 1 tablet (5 mg total) by mouth daily. [name], NP Taking Active   apixaban  (ELIQUIS ) 5 MG TABS tablet [id number] Yes Take 1 tablet (5 mg total) by mouth 2 (two) times daily. [name], NP Taking Active   atorvastatin  (LIPITOR ) 40 MG tablet [id number] Yes Take 1 tablet (40 mg total) by mouth daily. [name], NP Taking Active   Blood Glucose Monitoring Suppl DEVI [id number]  1 each by Does not apply route in the morning, at noon, and at bedtime. May substitute to any manufacturer covered by patient's insurance.  Patient not taking: Reported on 12/07/2023   [name], NP  Active  Med Note Alexandria Angel R   Tue Aug 25, 2023  1:04 PM) * did not pick up the new glucometer  insulin  glargine (LANTUS ) 100 UNIT/ML injection [id number]  Inject 0.16 mLs (16 Units total) into the skin daily. May increase up to 30 units once daily as directed by your provider. [name], NP  Active            Med Note Francetta Innocent, Kalina Morabito P   Tue Oct 27, 2023  2:34 PM) Taking 16 units daily  Insulin  Pen Needle (PEN NEEDLES) 32G X 4 MM MISC [id number]  Use as directed to inject insulin  once daily Nichols, Tonya S, NP  Active   LANTUS  SOLOSTAR 100 UNIT/ML Solostar Pen [id number] Yes Inject 16 Units into the skin daily. [provider] Taking Active   losartan  (COZAAR ) 100 MG tablet [id number]  Yes Take 1 tablet (100 mg total) by mouth daily. Arleen Lacer, MD Taking Active   metFORMIN  (GLUCOPHAGE -XR) 500 MG 24 hr tablet [id number] Yes Take 1 tablet (500 mg total) by mouth 2 (two) times daily with a meal. [name], NP Taking Active            Med Note [name], [facility] R   Tue Aug 25, 2023  1:11 PM) Per patient he picked up yesterday  metoprolol  succinate (TOPROL -XL) 100 MG 24 hr tablet [id number] Yes Take 1 tablet (100 mg total) by mouth daily. Take with or immediately following a meal. Arleen Lacer, MD Taking Active   mupirocin  ointment (BACTROBAN ) 2 % [id number]  Apply 1 Application topically 2 (two) times daily.  Patient not taking: Reported on 12/07/2023   [name], NP  Active   Semaglutide ,0.25 or 0.5MG /DOS, 2 MG/3ML SOPN [id number] Yes Inject 0.25 mg into the skin once a week. For four weeks then increase to 0.5 on the fifth week and going forward [name], NP Taking Active            Med Note Deirdre Fate Dec 07, 2023  4:03 PM) Patient is now administering 0.5 mg once a week  tadalafil (CIALIS) 20 MG tablet [id number]  Take 20 mg by mouth daily as needed. [provider]  Active               Assessment/Plan:   Diabetes: - Currently uncontrolled with A1c 15% above goal < 7%. Per PCP note, POC A1c was down to 13.6% on 10/01/23, though this was not recorded in results. S/sx of hyperglycemia have resolved, suggesting that addition of basal insulin  has resulted in improved glycemic control. Patient is reporting elevated post-prandial BG, but these are likely improved from baseline. No fasting BG information available today. Given increase in GI side effects since starting Ozepmic, will titrate insulin  today to help manage hyperglycemia. Patient reports that GI side effects with Ozempic  are tolerable, and he continues to be a great candidate for GLP-1RA therapy with ASCVD history and BMI > 40. Will continue metformin , as patient appears to  be tolerating well.  - Reviewed long term cardiovascular and renal outcomes of uncontrolled blood sugar - Reviewed goal A1c, goal fasting, and goal 2 hour post prandial glucose - Reviewed dietary modifications including increasing protein intake, avoiding carbohydrate heavy foods. Maintaining water intake and avoiding sugar-sweetened beverages. Educated extensively on lifestyle interventions to avoid GI upset with GLP-1RA.  - Reviewed lifestyle modifications including: increasing weight bearing exercise to avoid muscle loss with GLP-1RA.  - Recommend to continue  Ozempic  (semaglutide ) 0.5 mg injected into the skin once weekly. - Recommend to INCREASE insulin  glargine (Lantus ) 20 units injected into the skin daily. - Recommend to continue metformin  XR 500 mg PO BID  - Patient denies personal or family history of multiple endocrine neoplasia type 2, medullary thyroid  cancer; personal history of pancreatitis or gallbladder disease. - Recommend to check glucose once daily fasting and review BG readings over the phone in ~2 weeks. Patient would be a great candidate for CGM, will recommend at follow-up.  - UACR March 2025 WNL - Due for repeat A1C at PCP follow-up  Hypertension/HFrEF: - Currently uncontrolled above goal < 130/80 mmHg. Noted that repeat BMP ~1 week after starting losartan  demonstrated acceptable Scr bump < 30%, but K 5.3 mEq/L without dose adjustment by PCP. Dr. Addie Holstein instructed patient to increase losartan  and metoprolol  at appointment yesterday. Patient states that he was instructed to have repeat BMP at PCP appointment in June. Will reassess BP control at telephone follow-up.  - Reviewed long term cardiovascular and renal outcomes of uncontrolled blood pressure - Reviewed appropriate blood pressure monitoring technique and reviewed goal blood pressure. Recommended to check home blood pressure and heart rate a couple times per week - Recommend to continue amlodipine  5 mg PO daily,  losartan  100 mg daily, and metoprolol  100 mg daily - Obtain repeat BMP at upcoming PCP appointment to monitor K and Scr.  Hyperlipidemia/ASCVD Risk Reduction: - Currently uncontrolled with last LDL-C of 71 mg/dL (in 1610) above goal < 55 mg/dL given premature ASCVD, however patient has since restarted high-intensity statin.  - Reviewed long term complications of uncontrolled cholesterol - Recommend to continue atorvastatin  40 mg PO daily  - Repeat lipid panel at upcoming PCP appointment   Follow Up Plan: Pharmacist telephone 12/21/23, PCP 01/14/24   Arthea Larsson, PharmD PGY1 Pharmacy Resident

## 2023-12-10 ENCOUNTER — Encounter: Payer: Self-pay | Admitting: Cardiology

## 2023-12-10 DIAGNOSIS — I4891 Unspecified atrial fibrillation: Secondary | ICD-10-CM | POA: Diagnosis not present

## 2023-12-10 DIAGNOSIS — I5022 Chronic systolic (congestive) heart failure: Secondary | ICD-10-CM | POA: Insufficient documentation

## 2023-12-10 DIAGNOSIS — Z86718 Personal history of other venous thrombosis and embolism: Secondary | ICD-10-CM | POA: Diagnosis not present

## 2023-12-10 DIAGNOSIS — R600 Localized edema: Secondary | ICD-10-CM | POA: Diagnosis not present

## 2023-12-10 DIAGNOSIS — I480 Paroxysmal atrial fibrillation: Secondary | ICD-10-CM | POA: Insufficient documentation

## 2023-12-10 DIAGNOSIS — E11622 Type 2 diabetes mellitus with other skin ulcer: Secondary | ICD-10-CM | POA: Diagnosis not present

## 2023-12-10 DIAGNOSIS — I872 Venous insufficiency (chronic) (peripheral): Secondary | ICD-10-CM | POA: Diagnosis not present

## 2023-12-10 DIAGNOSIS — L97822 Non-pressure chronic ulcer of other part of left lower leg with fat layer exposed: Secondary | ICD-10-CM | POA: Diagnosis not present

## 2023-12-10 NOTE — Assessment & Plan Note (Signed)
 Follow-up echocardiograms had shown resolution of thrombus and improved EF.  He however remains on Eliquis  for A-fib.

## 2023-12-10 NOTE — Assessment & Plan Note (Signed)
 Despite intermittently being lost to follow-up, his EF has improved with optimization of medical management.  EF improved from 20 to 25% to now 40 to 45%. No symptoms of orthopnea or edema, although he does have some PND symptoms.  - Increase losartan  to 100 mg daily. - Increase metoprolol  succinate to 100 mg daily. - Monitor for symptoms of orthopnea, edema, or worsening dyspnea. - Consider spironolactone  if symptoms worsen or if BP remains elevated. - Low threshold to consider restarting as needed Lasix 

## 2023-12-10 NOTE — Assessment & Plan Note (Addendum)
 Managed by PCP.  Currently on Lantus , metformin  and semaglutide . -With reduced EF, would consider adding SGLT2 inhibitor - Recheck A1c and blood glucose levels at next PCP visit.

## 2023-12-10 NOTE — Assessment & Plan Note (Signed)
 Previous right catheterization showed no evidence of CAD with significant reduced EF.  EF has improved some but not fully back to baseline. Currently NYHA class II symptoms.  Has some orthopnea and exertional dyspnea but well-controlled edema and no orthopnea. Not currently on any diuretic.  Would like to go to convert from ARB to Entresto, but need to ensure adequate coverage. -Increase losartan  to 100 mg daily, increase Toprol  to 100 mg daily. -In follow-up due to reassess blood pressures and would next add spironolactone . -Continue semaglutide , but would also potentially consider SGLT2 inhibitor.

## 2023-12-10 NOTE — Assessment & Plan Note (Signed)
 Managed with atorvastatin  40 mg daily..  Last cholesterol check in 2022. - Order lipid panel at next PCP visit in June 2023.

## 2023-12-10 NOTE — Assessment & Plan Note (Signed)
 This patients CHA2DS2-VASc Score and unadjusted Ischemic Stroke Rate (% per year) is equal to 7.2 % stroke rate/year from a score of 5  Above score calculated as 1 point each if present [CHF, HTN, DM, Vascular=MI/PAD/Aortic Plaque, Age if 65-74, or Male] Above score calculated as 2 points each if present [Age > 75, or Stroke/TIA/TE]   Remains on Eliquis  5 mg twice daily; also covering history of LV thrombus => At this point has been long enough since LV thrombus to be acceptable to hold Eliquis  2 to 3 days preop for surgeries or procedures without bridging.

## 2023-12-10 NOTE — Assessment & Plan Note (Signed)
 He does feel rapid heart rate episodes-will increase Toprol  to 100 mg daily

## 2023-12-10 NOTE — Assessment & Plan Note (Signed)
 At inadequate control blood pressure today. -> Increase both losartan  and metoprolol  succinate to 100 mg daily. -Continue amlodipine  5 mg daily -If pressure still elevated would add spironolactone  5 mg daily.

## 2023-12-10 NOTE — Assessment & Plan Note (Signed)
 Atrial fibrillation with previous stroke.  He is having intermittent episodes of rapid heartbeats but has not necessarily noted irregular heartbeat to suggest A-fib.   Continued anticoagulation necessary. - Continue Eliquis  (apixaban ) 5 mg twice daily. -Increase Toprol  to 100 mg daily - If tachycardia episodes becomes worse, would consider event monitor to determine A-fib burden.

## 2023-12-15 NOTE — Patient Outreach (Signed)
 Complex Care Management   Visit Note  12/04/2023  Name:  Rodney Kane MRN: [id number] DOB: [birth year]  Situation: Referral received for Complex Care Management related to Stress Management I obtained verbal consent from Patient.  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   Stroke [facility])    Urine ketones 12/2019   Vitamin D  deficiency 12/2019    Assessment: Patient Reported Symptoms:  Cognitive Cognitive Status: Alert and oriented to person, place, and time, Normal speech and language skills Cognitive/Intellectual Conditions Management [RPT]: None reported or documented in medical history or problem list      Neurological Neurological Review of Symptoms: No symptoms reported    HEENT HEENT Symptoms Reported: No symptoms reported      Cardiovascular Cardiovascular Symptoms Reported: No symptoms reported    Respiratory Respiratory Symptoms Reported: No symptoms reported    Endocrine Patient reports the following symptoms related to hypoglycemia or hyperglycemia : No symptoms reported    Gastrointestinal Gastrointestinal Symptoms Reported: No symptoms reported      Genitourinary Genitourinary Symptoms Reported: No symptoms reported    Integumentary Integumentary Symptoms Reported: Not assessed    Musculoskeletal Musculoskelatal Symptoms Reviewed: No symptoms reported        Psychosocial Psychosocial Symptoms Reported: No symptoms reported Additional Psychological Details: Pt endorses stress  with trying to complete disability application. Strategies discussed to assist with stress management and amb referral completed to strengthen support Behavioral Management Strategies: Coping strategies, Support system Major Change/Loss/Stressor/Fears (CP): Medical condition, self        11/18/2023   10:58 AM  Depression screen PHQ 2/9  Decreased Interest 1  Down, Depressed, Hopeless 0  PHQ - 2 Score 1    There were no vitals filed for this visit.  Medications Reviewed Today     Reviewed by Adriana Albany, LCSW (Social Worker) on 12/15/23 at 1014  Med List Status: <None>   Medication Order Taking? Sig Documenting Provider Last Dose Status Informant  amLODipine  (NORVASC ) 5 MG tablet [id number] No Take 1 tablet (5 mg total) by mouth daily. [name], NP Taking Active   apixaban  (ELIQUIS ) 5 MG TABS tablet [id number] No Take 1 tablet (5 mg total) by mouth 2 (two) times daily. [name], NP Taking Active   atorvastatin  (LIPITOR ) 40 MG tablet [id number] No Take 1 tablet (40 mg total) by mouth daily. [name], NP Taking Active   Blood Glucose Monitoring Suppl DEVI [id number] No 1 each by Does not apply route in the morning, at noon, and at bedtime. May substitute to any manufacturer covered by patient's insurance.  Patient not taking: Reported on 12/07/2023   [name], NP Not Taking Active            Med Note [name], [facility] R   Tue Aug 25, 2023  1:04 PM) * did not pick up the new glucometer  Insulin  Pen Needle (PEN NEEDLES) 32G X 4 MM MISC [id number] No Use as directed to inject insulin  once daily Nichols, Tonya S,  NP Taking Active   LANTUS  SOLOSTAR 100 UNIT/ML Solostar Pen [id number]  Inject 20 Units into the skin daily. May increase up to 30 units daily as instructed by your provider. [name], NP  Active   losartan  (COZAAR ) 100 MG tablet [id number] No Take 1 tablet (100 mg total) by mouth daily. Arleen Lacer, MD Taking Active   metFORMIN  (GLUCOPHAGE -XR)  500 MG 24 hr tablet [id number] No Take 1 tablet (500 mg total) by mouth 2 (two) times daily with a meal. [name], NP Taking Active            Med Note [name], [facility] R   Tue Aug 25, 2023  1:11 PM) Per patient he picked up yesterday  metoprolol  succinate (TOPROL -XL) 100 MG 24 hr tablet [id number] No Take 1 tablet (100 mg total) by mouth daily. Take with or immediately following a meal. Arleen Lacer, MD Taking Active   Semaglutide ,0.25 or 0.5MG /DOS, 2 MG/3ML [name] [id number]  Inject 0.5 mg into the skin once a week. [name], NP  Active   tadalafil (CIALIS) 20 MG tablet [id number] No Take 20 mg by mouth daily as needed. [provider] Taking Active             Recommendation:   Continue Current Plan of Care  Follow Up Plan:   Telephone follow-up 2-4 weeks  Alease Hunter, LCSW   [facility], [facility] Clinical Social Worker Direct Dial: [phone]  Fax: [phone] Website: [email or web address] 10:26 AM

## 2023-12-15 NOTE — Patient Instructions (Signed)
 Visit Information  Thank you for taking time to visit with me today. Please don't hesitate to contact me if I can be of assistance to you before our next scheduled appointment.  Your next care management appointment is by telephone on 06/11 at 12:30 PM  Please call the care guide team at [phone] if you need to cancel, schedule, or reschedule an appointment.   Please call the Suicide and Crisis Lifeline: 988 go to [facility] [address] [place] [phone]) call 911 if you are experiencing a Mental Health or Behavioral Health Crisis or need someone to talk to.  Alease Hunter, LCSW   [place], [facility] Clinical Social Worker Direct Dial: [phone]  Fax: [phone] Website: [email or web address] 10:29 AM

## 2023-12-16 ENCOUNTER — Other Ambulatory Visit: Payer: Self-pay

## 2023-12-17 ENCOUNTER — Other Ambulatory Visit: Payer: Self-pay

## 2023-12-17 NOTE — Patient Outreach (Signed)
 Complex Care Management   Visit Note  12/17/2023  Name:  Rodney Kane MRN: [id number] DOB: 1976-07-19  Situation: Referral received for Complex Care Management related to Diabetes with Complications and HTN I obtained verbal consent from Patient.  Visit completed with patient  on the phone  Background:   Past Medical History:  Diagnosis Date   Coronavirus infection 06/2020   COVID 06/2020   Diabetes mellitus without complication (HCC)    Hemoglobin A1C greater than 9%, indicating poor diabetic control 12/2019   Hyperglycemia 12/2019   Hyperlipidemia 12/2019   Hypertension    Hypertensive urgency 12/2019   Noncompliance with medication regimen 12/2019   Nonischemic (hypertensive) dilated cardiomyopathy (HCC) 12/2016   EF has been 30 to 35%, recently lower.   Stroke [facility])    Urine ketones 12/2019   Vitamin D  deficiency 12/2019    Assessment: Patient Reported Symptoms:  Cognitive Cognitive Status: Able to follow simple commands, Alert and oriented to person, place, and time, Normal speech and language skills      Neurological Neurological Review of Symptoms: Headaches, Dizziness Neurological Conditions: Headache Neurological Management Strategies: Medication therapy  HEENT HEENT Symptoms Reported: No symptoms reported      Cardiovascular Cardiovascular Symptoms Reported: Chest pain or discomfort Does patient have uncontrolled Hypertension?: Yes Is patient checking Blood Pressure at home?: Yes Patient's Recent BP reading at home: last weeek 177/112 Cardiovascular Conditions: High blood cholesterol, Heart failure, Hypertension Cardiovascular Management Strategies: Medication therapy, Routine screening Weight: (!) 342 lb (155.1 kg)  Respiratory Respiratory Symptoms Reported: No symptoms reported    Endocrine Is patient diabetic?: Yes Is patient checking blood sugars at home?: Yes    Gastrointestinal Gastrointestinal Symptoms Reported: No symptoms reported       Genitourinary Genitourinary Symptoms Reported: No symptoms reported    Integumentary Integumentary Symptoms Reported: Wound Additional Integumentary Details: tunnel wound on left leg Skin Conditions: Wound Skin Management Strategies: Dressing changes  Musculoskeletal Musculoskelatal Symptoms Reviewed: No symptoms reported   Falls in the past year?: No    Psychosocial Psychosocial Symptoms Reported: No symptoms reported     Quality of Family Relationships: supportive, involved Do you feel physically threatened by others?: No      12/17/2023    1:22 PM  Depression screen PHQ 2/9  Decreased Interest 1  Down, Depressed, Hopeless 1  PHQ - 2 Score 2  Altered sleeping 1  Tired, decreased energy 1  Change in appetite 1  Feeling bad or failure about yourself  0  Trouble concentrating 2  Moving slowly or fidgety/restless 2  Suicidal thoughts 0  PHQ-9 Score 9  Difficult doing work/chores Somewhat difficult    There were no vitals filed for this visit.  Medications Reviewed Today     Reviewed by [name], RN (Registered Nurse) on 12/17/23 at 1308  Med List Status: <None>   Medication Order Taking? Sig Documenting Provider Last Dose Status Informant  amLODipine  (NORVASC ) 5 MG tablet [id number] Yes Take 1 tablet (5 mg total) by mouth daily. [name], NP Taking Active   apixaban  (ELIQUIS ) 5 MG TABS tablet [id number] Yes Take 1 tablet (5 mg total) by mouth 2 (two) times daily. [name], NP Taking Active   atorvastatin  (LIPITOR ) 40 MG tablet [id number] Yes Take 1 tablet (40 mg total) by mouth daily. [name], NP Taking Active   Blood Glucose Monitoring Suppl DEVI [id number] Yes 1 each by Does not apply route in the morning, at noon, and at bedtime. May  substitute to any manufacturer covered by patient's insurance. [name], NP Taking Active            Med Note [name], [name]   Tue Aug 25, 2023  1:04 PM) * did not pick up the new glucometer  Insulin  Pen  Needle (PEN NEEDLES) 32G X 4 MM MISC [id number] Yes Use as directed to inject insulin  once daily Nichols, Tonya S, NP Taking Active   LANTUS  SOLOSTAR 100 UNIT/ML Solostar Pen [id number] Yes Inject 20 Units into the skin daily. May increase up to 30 units daily as instructed by your provider. [name], NP Taking Active   losartan  (COZAAR ) 100 MG tablet [id number]  Take 1 tablet (100 mg total) by mouth daily. Arleen Lacer, MD  Active   metFORMIN  (GLUCOPHAGE -XR) 500 MG 24 hr tablet [id number] Yes Take 1 tablet (500 mg total) by mouth 2 (two) times daily with a meal. [name], NP Taking Active            Med Note [name], [name]   Tue Aug 25, 2023  1:11 PM) Per patient he picked up yesterday  metoprolol  succinate (TOPROL -XL) 100 MG 24 hr tablet [id number] Yes Take 1 tablet (100 mg total) by mouth daily. Take with or immediately following a meal. Arleen Lacer, MD Taking Active   Semaglutide ,0.25 or 0.5MG /DOS, 2 MG/3ML [name] [id number] Yes Inject 0.5 mg into the skin once a week. [name], NP Taking Active   tadalafil (CIALIS) 20 MG tablet [id number] Yes Take 20 mg by mouth daily as needed. [provider] Taking Active             Recommendation:   PCP Follow-up  Follow Up Plan:   Telephone follow up appointment with care management team member scheduled for:  01/15/24  115 pm  [name] RN, BSN, [facility]  [facility], [facility]  Care Coordinator Phone: [phone]

## 2023-12-17 NOTE — Patient Instructions (Signed)
 Visit Information  Thank you for taking time to visit with me today. Please don't hesitate to contact me if I can be of assistance to you before our next scheduled appointment.  Your next care management appointment is scheduled for:  01/15/24  115 pm  Please call the care guide team at [phone] if you need to cancel, schedule, or reschedule an appointment.   Please call [phone]-TALK (toll free, 24 hour hotline) if you are experiencing a Mental Health or Behavioral Health Crisis or need someone to talk to.  [name] RN, BSN, [facility]  [facility], [facility]  Care Coordinator Phone: [phone]

## 2023-12-21 ENCOUNTER — Other Ambulatory Visit (HOSPITAL_COMMUNITY): Payer: Self-pay

## 2023-12-21 ENCOUNTER — Other Ambulatory Visit: Payer: Self-pay

## 2023-12-21 DIAGNOSIS — E1169 Type 2 diabetes mellitus with other specified complication: Secondary | ICD-10-CM

## 2023-12-21 DIAGNOSIS — I5041 Acute combined systolic (congestive) and diastolic (congestive) heart failure: Secondary | ICD-10-CM

## 2023-12-21 DIAGNOSIS — E118 Type 2 diabetes mellitus with unspecified complications: Secondary | ICD-10-CM

## 2023-12-21 MED ORDER — FREESTYLE LIBRE 3 PLUS SENSOR MISC: 3 refills | Status: AC

## 2023-12-21 NOTE — Progress Notes (Signed)
 12/21/2023 Name: Rodney Kane MRN: [id number] DOB: [birth year]  Chief Complaint  Patient presents with   Diabetes    Rodney Kane is a 48 y.o. year old male who was referred for medication management by their primary care provider, [name], NP. They presented for telephone appointment today.   They were referred to the pharmacist by their PCP for assistance in managing diabetes. PMH includes HF (EF 45-50% in Aug 2023, LV global hypokinesis, G1DD), atrial fibrillation, HTN, CVA (2021), hx of VTE (2022), T2DM. Patient is following with complex care management for transportation needs, food insecurity, financial difficulties related to housing, and applying for disability. 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   Subjective:  Patient was initially outreached on 08/25/23 by Alexandria Angel, PharmD after patient had recently been seen in the ED 2/2 hyperglycemia and cellulitis. At in-person pharmacy appointment with me on 09/08/23, patient brought all his medication bottles and glucometer. Random BG were ranging 200-300s, with symptomatic hyperglycemia. Initiated insulin  glargine (Lantus ) 16 units daily and increased losartan  to 50 mg daily for elevated BP in the setting of HFrEF. Also restarted atorvastatin . At pharmacy f/u on 10/27/23, we initiated Ozempic , continued Lantus  and metformin , and stopped glipizide . Also recommended to start amlodipine  5 mg daily for elevated BP. Patient was seen by cardiology on 12/06/23. BP was still elevated and he was instructed to increase losartan  and metoprolol . He has also been reinitiated on a DOAC for stroke prevention given Afib dx. At pharmacy telephone appt on 12/08/23, patient reported post-prandial BG consistently > 200 mg/dL. He was having some GI upset with Ozempic , so we titrated Lantus  from 16 to 20 units daily.   Today patient confirms increasing Lantus  to 20 units daily as previously instructed. However, last Saturday he  reported having symptoms of dizziness, feeling clammy, nausea, blurry vision when he was fishing and out in the sun for a while. He treated this with water and gatorade but reported that he continued to feel weak for a while. He did not start feeling better within 15-20 minutes after drinking the gatorade. He did not have his glucometer with him to check his BG at that time. He has been checking fasting BG once daily which are listed below. He reports that his GI AE have improved with staying on Ozempic  0.5 mg weekly longer - he is having less frequent indigestion and diarrhea.   Care Team: Primary Care Provider: [name], NP ; Next Scheduled Visit: 01/14/24 Cardiology: Dr. Addie Holstein - no follow-up scheduled  Medication Access/Adherence  Current Pharmacy:  [facility] [address] [place] - [address] [place] [address] Phone: [phone] Fax: [phone]   Patient reports affordability concerns with their medications: Yes  Patient reports access/transportation concerns to their pharmacy: No  Patient reports adherence concerns with their medications:  No  - denies missed doses. Fill history appears appropriate today  Patient has been connected with Medicare Complex Care Management and SW for assistance applying for disability given residual deficits with speech.   Diabetes:  Current medications: metformin  500 XR BID, Lantus  (insulin  glargine) 20 units daily (~1PM), Ozempic  0.5 mg once weekly Medications tried in the past: Novolog , sitagliptin , and linagliptin  in the past  Current glucose readings:   12/15/23: 193 12/16/23: 249 12/17/23: 239 12/18/23: 255 Using AccuChek Guide meter  Patient endorses one episode of symptoms of hypoglycemia last week - felt shaky, dizzy. Checked his blood sugar and it  was in the 200s.  Denies polydipsia, polyphagia, nocturia, blurred vision.   Current meal patterns: Typically 1-2 meals/day around12-1PM  and then in the evening 6-8PM. Reports that some days he does not eat any meals, but instead snacks throughout the day (like multiple granola bars). Trying to reduce drinking regular sodas, and trying to drink more water and diet/sugar free options.  - Reports that it is difficult for him to cut out sweets. He will go a couple days without eating sweets, but then will eat a lot at one time.  - Lunch: not consistent, varies - Supper: varies - Drinks water all throughout the day, has switched to diet/sugar-free sodas, koolaid punch zero sugar, minutemaid zero sugar drinks  Current physical activity: occasional walking, has a "shake plate" at home (core exercise). No other strength training reported.   Current medication access support: insurance - Medicaid  Hypertension:  Current medications: losartan  100 mg PO daily, metoprolol  100 mg daily Medications previously tried: furosemide  40 mg daily PRN (he was instructed to stop this at pharmacy telephone appt on 08/25/23 as it was an old Rx), hydralazine   Patient has a validated, automated, upper arm home BP cuff.  Current blood pressure readings readings: did not discuss today  Objective:  BP Readings from Last 3 Encounters:  12/07/23 (!) 156/84  10/27/23 (!) 168/109  10/01/23 (!) 164/102   Lab Results  Component Value Date   HGBA1C 15.0 (A) 08/21/2023   Per PCP note on 10/01/23 - in-office A1c was 13.6%, though does not appear that this was entered into order results.   eGFR > 60 mL/min, UACR 10/01/23: 4 mg/g  Lab Results  Component Value Date   CREATININE 1.30 (H) 09/16/2023   BUN 14 09/16/2023   NA 139 09/16/2023   K 5.3 (H) 09/16/2023   CL 99 09/16/2023   CO2 25 09/16/2023    Lab Results  Component Value Date   CHOL 132 05/30/2021   HDL 50 05/30/2021   LDLCALC 71 05/30/2021   TRIG 49 05/30/2021   CHOLHDL 2.6 05/30/2021    Medications Reviewed Today     Reviewed by Adra Alanis, RPH (Pharmacist) on 12/21/23 at 1446   Med List Status: <None>   Medication Order Taking? Sig Documenting Provider Last Dose Status Informant  amLODipine  (NORVASC ) 5 MG tablet [id number] Yes Take 1 tablet (5 mg total) by mouth daily. [name], NP Taking Active   apixaban  (ELIQUIS ) 5 MG TABS tablet [id number] Yes Take 1 tablet (5 mg total) by mouth 2 (two) times daily. [name], NP Taking Active   atorvastatin  (LIPITOR ) 40 MG tablet [id number] Yes Take 1 tablet (40 mg total) by mouth daily. [name], NP Taking Active   Blood Glucose Monitoring Suppl DEVI [id number]  1 each by Does not apply route in the morning, at noon, and at bedtime. May substitute to any manufacturer covered by patient's insurance. [name], NP  Active            Med Note [name] Dec 21, 2023  2:46 PM)    Insulin  Pen Needle (PEN NEEDLES) 32G X 4 MM MISC [id number]  Use as directed to inject insulin  once daily Nichols, Tonya S, NP  Active   LANTUS  SOLOSTAR 100 UNIT/ML Solostar Pen [id number] Yes Inject 20 Units into the skin daily. May increase up to 30 units daily as instructed by your provider. [name], NP Taking Active  Med Note Adra Alanis   Mon Dec 21, 2023  2:46 PM) Taking 20 units daily  losartan  (COZAAR ) 100 MG tablet [id number] Yes Take 1 tablet (100 mg total) by mouth daily. Arleen Lacer, MD Taking Active   metFORMIN  (GLUCOPHAGE -XR) 500 MG 24 hr tablet [id number] Yes Take 1 tablet (500 mg total) by mouth 2 (two) times daily with a meal. [name], NP Taking Active            Med Note Adra Alanis   Mon Dec 21, 2023  2:46 PM)    metoprolol  succinate (TOPROL -XL) 100 MG 24 hr tablet [id number] Yes Take 1 tablet (100 mg total) by mouth daily. Take with or immediately following a meal. Arleen Lacer, MD Taking Active   Semaglutide ,0.25 or 0.5MG /DOS, 2 MG/3ML [name] [id number] Yes Inject 0.5 mg into the skin once a week. [name], NP Taking Active   tadalafil (CIALIS) 20 MG tablet [id number]  Yes Take 20 mg by mouth daily as needed. [provider] Taking Active             Assessment/Plan:   Submitted PA for FL3+: Key: [id number]  Diabetes: - Currently uncontrolled with A1c 15% above goal < 7%. Per PCP note, POC A1c was down to 13.6% on 10/01/23, though this was not recorded in results. S/sx of hyperglycemia have resolved, suggesting that addition of basal insulin  and GLP-1RA has resulted in improved glycemic control. Continues to be a great candidate for GLP-1RA therapy with ASCVD history and BMI > 40. However, fasting BG continue to be consistently elevated > 200 mg/dL. Hesitant to increase insulin  further today given reported hypoglycemic event (vs relative hypoglycemia). Patient amenable to starting CGM.  Will consider maximizing metformin  dose after confirming stable kidney function.  - Reviewed long term cardiovascular and renal outcomes of uncontrolled blood sugar - Reviewed goal A1c, goal fasting, and goal 2 hour post prandial glucose - Reviewed dietary modifications including increasing protein intake, avoiding carbohydrate heavy foods. Maintaining water intake and avoiding sugar-sweetened beverages. Educated extensively on lifestyle interventions to avoid GI upset with GLP-1RA.  - Reviewed lifestyle modifications including: increasing weight bearing exercise to avoid muscle loss with GLP-1RA.  - Recommend to continue Ozempic  (semaglutide ) 0.5 mg injected into the skin once weekly. - Recommend to continue insulin  glargine (Lantus ) 20 units injected into the skin daily. - Recommend to continue metformin  XR 500 mg PO BID  - Patient denies personal or family history of multiple endocrine neoplasia type 2, medullary thyroid  cancer; personal history of pancreatitis or gallbladder disease. - Recommend to continue monitoring fasting BG and record values in log. Initiated PA for CGM today, will plan to set up at in-person appointment next week.  - UACR March 2025 WNL -  Will obtain repeat A1C at pharmacy follow-up 12/30/23  Hypertension/HFrEF: - Currently uncontrolled above goal < 130/80 mmHg.  Noted elevated BP of 177/112 at wound care appt on 12/10/23. Noted that repeat BMP ~1 week after starting losartan  demonstrated acceptable Scr bump < 30%, but K 5.3 mEq/L without dose adjustment by PCP. Dr. Addie Holstein instructed patient to increase losartan  and metoprolol  at appointment yesterday. Patient states that he was instructed to have repeat BMP at PCP appointment in June. Will expedite repeat BMP and obtain at pharmacy follow-up next week.  - Reviewed long term cardiovascular and renal outcomes of uncontrolled blood pressure - Reviewed appropriate blood pressure monitoring technique and reviewed goal blood pressure. Recommended to check home blood pressure  and heart rate a couple times per week - Recommend to continue amlodipine  5 mg PO daily, losartan  100 mg daily, and metoprolol  100 mg daily - Obtain repeat BMP on 12/30/23 appointment to monitor K and Scr.  Hyperlipidemia/ASCVD Risk Reduction: - Currently uncontrolled with last LDL-C of 71 mg/dL (in 4098) above goal < 55 mg/dL given premature ASCVD, however patient has not had repeat lipid panel since restarting high-intensity statin.  - Reviewed long term complications of uncontrolled cholesterol - Recommend to continue atorvastatin  40 mg PO daily  - Repeat lipid panel at upcoming PCP appointment   Follow Up Plan: Pharmacist in person 12/30/23, PCP 01/14/24   Arthea Larsson, PharmD PGY1 Pharmacy Resident

## 2023-12-23 ENCOUNTER — Telehealth: Payer: Self-pay | Admitting: *Deleted

## 2023-12-23 NOTE — Progress Notes (Signed)
 Freestyle Libre 3 Plus Sensor [place]: [id number] - Approved 12/21/23. Expires 06/21/24.   Arthea Larsson, PharmD PGY1 Pharmacy Resident

## 2023-12-23 NOTE — Progress Notes (Signed)
 Complex Care Management Care Guide Note  12/23/2023 Name: [name] MRN: [id number] DOB: 03/03/1976  Rodney Kane is a 48 y.o. year old male who is a primary care patient of [name], NP and is actively engaged with the care management team. I reached out to [employer] by phone today to assist with scheduling  with the BSW.  Follow up plan: Unsuccessful telephone outreach attempt made. A HIPAA compliant phone message was left for the patient providing contact information and requesting a return call.  Barnie Bora  [facility]  Value-Based Care Institute, [facility] Guide  Direct Dial: [phone]  Fax [phone]

## 2023-12-24 ENCOUNTER — Other Ambulatory Visit

## 2023-12-28 DIAGNOSIS — L97822 Non-pressure chronic ulcer of other part of left lower leg with fat layer exposed: Secondary | ICD-10-CM | POA: Diagnosis not present

## 2023-12-28 DIAGNOSIS — I872 Venous insufficiency (chronic) (peripheral): Secondary | ICD-10-CM | POA: Diagnosis not present

## 2023-12-28 DIAGNOSIS — Z8673 Personal history of transient ischemic attack (TIA), and cerebral infarction without residual deficits: Secondary | ICD-10-CM | POA: Diagnosis not present

## 2023-12-28 DIAGNOSIS — Z794 Long term (current) use of insulin: Secondary | ICD-10-CM | POA: Diagnosis not present

## 2023-12-28 NOTE — Progress Notes (Signed)
 Complex Care Management Care Guide Note  12/28/2023 Name: Rodney Kane MRN: [id number] DOB: August 02, 1975  Rodney Kane is a 48 y.o. year old male who is a primary care patient of Rodney Morel, NP and is actively engaged with the care management team. I reached out to Rodney Kane by phone today to assist with scheduling  with the BSW.  Follow up plan: Telephone appointment with complex care management team member scheduled for:  01/01/24 Rodney Kane  [facility]  Value-Based Care Institute, [facility] Guide  Direct Dial: [phone]  Fax [phone]

## 2023-12-30 ENCOUNTER — Telehealth: Payer: Self-pay

## 2023-12-30 ENCOUNTER — Other Ambulatory Visit (HOSPITAL_COMMUNITY): Payer: Self-pay

## 2023-12-30 ENCOUNTER — Ambulatory Visit: Payer: Self-pay

## 2023-12-30 ENCOUNTER — Other Ambulatory Visit: Payer: Self-pay | Admitting: Licensed Clinical Social Worker

## 2023-12-30 NOTE — Patient Outreach (Signed)
 Complex Care Management   Visit Note  12/30/2023  Name:  Rodney Kane MRN: [id number] DOB: 11/29/1975  Situation: Referral received for Complex Care Management related to SDOH Barriers:  Stress I obtained verbal consent from Patient.  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   Stroke [facility])    Urine ketones 12/2019   Vitamin D  deficiency 12/2019    Assessment: Patient Reported Symptoms:  Cognitive Cognitive Status: Alert and oriented to person, place, and time, Normal speech and language skills Cognitive/Intellectual Conditions Management [RPT]: None reported or documented in medical history or problem list      Neurological Neurological Review of Symptoms: Not assessed    HEENT HEENT Symptoms Reported: No symptoms reported      Cardiovascular Cardiovascular Symptoms Reported: Not assessed    Respiratory Respiratory Symptoms Reported: No symptoms reported    Endocrine Patient reports the following symptoms related to hypoglycemia or hyperglycemia : No symptoms reported Is patient diabetic?: Yes Is patient checking blood sugars at home?: Yes    Gastrointestinal Gastrointestinal Symptoms Reported: No symptoms reported      Genitourinary Genitourinary Symptoms Reported: No symptoms reported    Integumentary Integumentary Symptoms Reported: Not assessed    Musculoskeletal Musculoskelatal Symptoms Reviewed: No symptoms reported        Psychosocial Psychosocial Symptoms Reported: No symptoms  reported Additional Psychological Details: Pt denies an increase in stress or any depression/anxiety symptoms. He is looking forward to meeting with BSW regarding local resources to assist with disability application. LCSW discussed strategies to encourage self-care and pt was successful in identifying solutions Behavioral Management Strategies: Coping strategies, Community resources Major Change/Loss/Stressor/Fears (CP): Medical condition, self        12/17/2023    1:22 PM  Depression screen PHQ 2/9  Decreased Interest 1  Down, Depressed, Hopeless 1  PHQ - 2 Score 2  Altered sleeping 1  Tired, decreased energy 1  Change in appetite 1  Feeling bad or failure about yourself  0  Trouble concentrating 2  Moving slowly or fidgety/restless 2  Suicidal thoughts 0  PHQ-9 Score 9  Difficult doing work/chores Somewhat difficult    There were no vitals filed for this visit.  Medications Reviewed Today     Reviewed by Rodney Albany, LCSW (Social Worker) on 12/30/23 at 1617  Med List Status: <None>   Medication Order Taking? Sig Documenting Provider Last Dose Status Informant  amLODipine  (NORVASC ) 5 MG tablet [id number] No Take 1 tablet (5 mg total) by mouth daily. [name], NP Taking Active   apixaban  (ELIQUIS ) 5 MG TABS tablet [id number] No Take 1 tablet (5 mg total) by mouth 2 (two) times daily. [name], NP Taking Active   atorvastatin  (LIPITOR ) 40 MG tablet [id number] No Take 1 tablet (40 mg total) by mouth daily. [name], NP Taking Active   Blood Glucose Monitoring Suppl DEVI [id number] No 1 each by Does not apply route in the morning, at noon, and at bedtime. May substitute to any manufacturer covered  by patient's insurance. [name], NP Taking Active            Med Note Rodney Kane Dec 21, 2023  2:46 PM)    Continuous Glucose Sensor (FREESTYLE LIBRE 3 PLUS SENSOR) [place] [id number]  Change sensor every 15 days. [name], NP  Active    Insulin  Pen Needle (PEN NEEDLES) 32G X 4 MM MISC [id number] No Use as directed to inject insulin  once daily Nichols, Tonya S, NP Taking Active   LANTUS  SOLOSTAR 100 UNIT/ML Solostar Pen [id number] No Inject 20 Units into the skin daily. May increase up to 30 units daily as instructed by your provider. [name], NP Taking Active            Med Note Rodney Kane Dec 21, 2023  2:46 PM) Taking 20 units daily  losartan  (COZAAR ) 100 MG tablet [id number] No Take 1 tablet (100 mg total) by mouth daily. Arleen Lacer, MD Taking Active   metFORMIN  (GLUCOPHAGE -XR) 500 MG 24 hr tablet [id number] No Take 1 tablet (500 mg total) by mouth 2 (two) times daily with a meal. [name], NP Taking Active            Med Note Adra Alanis   Mon Dec 21, 2023  2:46 PM)    metoprolol  succinate (TOPROL -XL) 100 MG 24 hr tablet [id number] No Take 1 tablet (100 mg total) by mouth daily. Take with or immediately following a meal. Arleen Lacer, MD Taking Active   Semaglutide ,0.25 or 0.5MG /DOS, 2 MG/3ML SOPN [id number] No Inject 0.5 mg into the skin once a week. [name], NP Taking Active   tadalafil (CIALIS) 20 MG tablet [id number] No Take 20 mg by mouth daily as needed. [provider] Taking Active             Recommendation:   Continue Current Plan of Care  Follow Up Plan:   Telephone follow-up in 1 month  Alease Hunter, LCSW [facility]  [facility], [facility] Clinical Social Worker Direct Dial: [phone]  Fax: [phone] Website: [email or web address] 4:23 PM

## 2023-12-30 NOTE — Telephone Encounter (Signed)
 Outreach patient to offer to reschedule missed appointment. Patient agreeable [facility] work. Scheduled at 1:20PM. Will add order for direct-LDL since he will not be fasting.   Patient confirmed that he picked up FL3+ sensor, but he has not downloaded the app and started using yet. Provided instructions over the phone. Will also send instructions via MyChart. Advised patient if he is not able to set it up on his own, he can bring all the supplies in to his next PCP appt.   Arthea Larsson, PharmD PGY1 Pharmacy Resident

## 2023-12-30 NOTE — Patient Instructions (Signed)
 Visit Information  Mr. Dulworth was given information about Medicaid Managed Care team care coordination services as a part of their [employer] Medicaid benefit. Champ Corteze Crofford verbally consentedto engagement with the [facility] Managed Care team.   If you are experiencing a medical emergency, please call 911 or report to your local emergency department or urgent care.   If you have a non-emergency medical problem during routine business hours, please contact your provider's office and ask to speak with a nurse.   For questions related to your [employer] [facility] health plan, please call: [phone]  OR visit the member homepage at: [email or web address]  If you would like to schedule transportation through your [facility] [facility] plan, please call the following number at least 2 days in advance of your appointment: [phone]  If you are experiencing a behavioral health crisis, call the [employer] [place]  Behavioral Health Crisis Line at [phone]). The line is available 24 hours a day, seven days a week.  If you would like help to quit smoking, call 1-800-QUIT-NOW ((831) 333-8290) OR Espaol: 1-855-Djelo-Ya (0-347-425-9563) o para ms informacin haga clic aqu or Text READY to 875-643 to register via text   Mr. Lader - following are the goals we discussed in your visit today:    Goals Addressed             This Visit's Progress    LCSW VBCI Social Work Care Plan   On track    Problems:   Stress  CSW Clinical Goal(s):   Over the next 60 days the Patient will attend all scheduled medical appointments as evidenced by patient report and care team review of appointment completion in electronic MEDICAL RECORD NUMBER  demonstrate a reduction in symptoms related to Stress at applying for Disabilty .  Interventions:  Mental Health:  Evaluation of current treatment plan  related to Stress at applying for Disability Active listening / Reflection utilized [employer] / information provided Emotional Support Provided Solution-Focued Strategies employed: [employer] collaborated with BSW regarding patient care needs/barriers to care, who pt has scheduled appt for on 6/13  Patient Goals/Self-Care Activities:  Increase coping skills and healthy habits  Plan:   Telephone follow up appointment with care management team member scheduled for:  2-4 weeks        Please see education materials related to topics discussed provided by MyChart link.  Patient verbalizes understanding of instructions and care plan provided today and agrees to view in MyChart. Active MyChart status and patient understanding of how to access instructions and care plan via MyChart confirmed with patient.     Licensed Visual merchandiser will f/up with you in 2-4 weeks  Alease Hunter, LCSW [facility]  [facility], [facility] Clinical Social Worker Direct Dial: [phone]  Fax: [phone] Website: [email or web address] 4:24 PM   Following is a copy of your plan of care:  There are no care plans that you recently modified to display for this patient.

## 2023-12-30 NOTE — Progress Notes (Deleted)
 12/30/2023 Name: Rodney Kane MRN: [id number] DOB: 05/21/1976  No chief complaint on file.   Drayden Corteze Fabro is a 48 y.o. year old male who was referred for medication management by their primary care provider, [name], NP. They presented for a face-to-face appointment today for CGM setup.    They were referred to the pharmacist by their PCP for assistance in managing diabetes. PMH includes HF (EF 45-50% in Aug 2023, LV global hypokinesis, G1DD), atrial fibrillation, HTN, CVA (2021), hx of VTE (2022), T2DM. Patient is following with complex care management for transportation needs, food insecurity, financial difficulties related to housing, and applying for disability. 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   Subjective:  Patient was initially outreached on 08/25/23 by Alexandria Angel, PharmD after patient had recently been seen in the ED 2/2 hyperglycemia and cellulitis. At in-person pharmacy appointment with me on 09/08/23, patient brought all his medication bottles and glucometer. Random BG were ranging 200-300s, with symptomatic hyperglycemia. Initiated insulin  glargine (Lantus ) 16 units daily and increased losartan  to 50 mg daily for elevated BP in the setting of HFrEF. Also restarted atorvastatin . At pharmacy f/u on 10/27/23, we initiated Ozempic , continued Lantus  and metformin , and stopped glipizide . Also recommended to start amlodipine  5 mg daily for elevated BP. Patient was seen by cardiology on 12/06/23. BP was still elevated and he was instructed to increase losartan  and metoprolol . He has also been reinitiated on a DOAC for stroke prevention given Afib dx. At pharmacy telephone appt on 12/08/23, patient reported post-prandial BG consistently > 200 mg/dL. He was having some GI upset with Ozempic , so we titrated Lantus  from 16 to 20 units daily. At pharmacy telephone appt on 12/21/23 - pt reported one episode of s/sx consistent with hypoglycemia but self monitored  BG continue to be in the 200s. Suggested initiating CGM, which patient was agreeable to.   Tioday ***  OLD: Today patient confirms increasing Lantus  to 20 units daily as previously instructed. However, last Saturday he reported having symptoms of dizziness, feeling clammy, nausea, blurry vision when he was fishing and out in the sun for a while. He treated this with water and gatorade but reported that he continued to feel weak for a while. He did not start feeling better within 15-20 minutes after drinking the gatorade. He did not have his glucometer with him to check his BG at that time. He has been checking fasting BG once daily which are listed below. He reports that his GI AE have improved with staying on Ozempic  0.5 mg weekly longer - he is having less frequent indigestion and diarrhea.   Care Team: Primary Care Provider: [name], NP ; Next Scheduled Visit: 01/14/24 Cardiology: Dr. Addie Holstein - no follow-up scheduled  Medication Access/Adherence  Current Pharmacy:  [facility] [address] [place] - [address] [place] [address] Phone: [phone] Fax: [phone]   Patient reports affordability concerns with their medications: Yes  Patient reports access/transportation concerns to their pharmacy: No  Patient reports adherence concerns with their medications:  No  - denies missed doses. Fill history appears appropriate today  Patient has been connected with Medicare Complex Care Management and SW for assistance applying for disability given residual deficits with speech.   Diabetes:  Current medications: metformin  500 XR BID, Lantus  (insulin  glargine) 20 units daily (~1PM), Ozempic  0.5 mg once weekly Medications tried in the past: Novolog , sitagliptin , and linagliptin  in the past  Current glucose readings:   12/15/23: 193 12/16/23: 249 12/17/23: 239 12/18/23: 255 Using AccuChek Guide meter  Patient endorses one episode of  symptoms of hypoglycemia last week - felt shaky, dizzy. Checked his blood sugar and it was in the 200s.  Denies polydipsia, polyphagia, nocturia, blurred vision.   Current meal patterns: Typically 1-2 meals/day around12-1PM and then in the evening 6-8PM. Reports that some days he does not eat any meals, but instead snacks throughout the day (like multiple granola bars). Trying to reduce drinking regular sodas, and trying to drink more water and diet/sugar free options.  - Reports that it is difficult for him to cut out sweets. He will go a couple days without eating sweets, but then will eat a lot at one time.  - Lunch: not consistent, varies - Supper: varies - Drinks water all throughout the day, has switched to diet/sugar-free sodas, koolaid punch zero sugar, minutemaid zero sugar drinks  Current physical activity: occasional walking, has a shake plate at home (core exercise). No other strength training reported.   Current medication access support: insurance - Medicaid  Hypertension:  Current medications: losartan  100 mg PO daily, metoprolol  100 mg daily Medications previously tried: furosemide  40 mg daily PRN (he was instructed to stop this at pharmacy telephone appt on 08/25/23 as it was an old Rx), hydralazine   Patient has a validated, automated, upper arm home BP cuff.  Current blood pressure readings readings: did not discuss today  Objective:  BP Readings from Last 3 Encounters:  12/07/23 (!) 156/84  10/27/23 (!) 168/109  10/01/23 (!) 164/102   Lab Results  Component Value Date   HGBA1C 15.0 (A) 08/21/2023   Per PCP note on 10/01/23 - in-office A1c was 13.6%, though does not appear that this was entered into order results.   eGFR > 60 mL/min, UACR 10/01/23: 4 mg/g  Lab Results  Component Value Date   CREATININE 1.30 (H) 09/16/2023   BUN 14 09/16/2023   NA 139 09/16/2023   K 5.3 (H) 09/16/2023   CL 99 09/16/2023   CO2 25 09/16/2023    Lab Results  Component Value  Date   CHOL 132 05/30/2021   HDL 50 05/30/2021   LDLCALC 71 05/30/2021   TRIG 49 05/30/2021   CHOLHDL 2.6 05/30/2021    Medications Reviewed Today   Medications were not reviewed in this encounter     Assessment/Plan:   Submitted PA for FL3+: Key: [id number]  Diabetes: - Currently uncontrolled with A1c 15% above goal < 7%. Per PCP note, POC A1c was down to 13.6% on 10/01/23, though this was not recorded in results. S/sx of hyperglycemia have resolved, suggesting that addition of basal insulin  and GLP-1RA has resulted in improved glycemic control. Continues to be a great candidate for GLP-1RA therapy with ASCVD history and BMI > 40. However, fasting BG continue to be consistently elevated > 200 mg/dL. Hesitant to increase insulin  further today given reported hypoglycemic event (vs relative hypoglycemia). Patient amenable to starting CGM.  Will consider maximizing metformin  dose after confirming stable kidney function.  - Reviewed long term cardiovascular and renal outcomes of uncontrolled blood sugar - Reviewed goal A1c, goal fasting, and goal 2 hour post prandial glucose - Reviewed dietary modifications including increasing protein intake, avoiding carbohydrate heavy foods. Maintaining water intake and avoiding sugar-sweetened beverages. Educated extensively on lifestyle interventions to avoid GI upset with GLP-1RA.  - Reviewed lifestyle modifications including: increasing weight bearing exercise to avoid muscle loss with GLP-1RA.  -  Recommend to continue Ozempic  (semaglutide ) 0.5 mg injected into the skin once weekly. - Recommend to continue insulin  glargine (Lantus ) 20 units injected into the skin daily. - Recommend to continue metformin  XR 500 mg PO BID  - Patient denies personal or family history of multiple endocrine neoplasia type 2, medullary thyroid  cancer; personal history of pancreatitis or gallbladder disease. - Recommend to continue monitoring fasting BG and record values in log.  Initiated PA for CGM today, will plan to set up at in-person appointment next week.  - UACR March 2025 WNL - Will obtain repeat A1C at pharmacy follow-up 12/30/23  Hypertension/HFrEF: - Currently uncontrolled above goal < 130/80 mmHg.  Noted elevated BP of 177/112 at wound care appt on 12/10/23. Noted that repeat BMP ~1 week after starting losartan  demonstrated acceptable Scr bump < 30%, but K 5.3 mEq/L without dose adjustment by PCP. Dr. Addie Holstein instructed patient to increase losartan  and metoprolol  at appointment yesterday. Patient states that he was instructed to have repeat BMP at PCP appointment in June. Will expedite repeat BMP and obtain at pharmacy follow-up next week.  - Reviewed long term cardiovascular and renal outcomes of uncontrolled blood pressure - Reviewed appropriate blood pressure monitoring technique and reviewed goal blood pressure. Recommended to check home blood pressure and heart rate a couple times per week - Recommend to continue amlodipine  5 mg PO daily, losartan  100 mg daily, and metoprolol  100 mg daily - Obtain repeat BMP on 12/30/23 appointment to monitor K and Scr.  Hyperlipidemia/ASCVD Risk Reduction: - Currently uncontrolled with last LDL-C of 71 mg/dL (in 4782) above goal < 55 mg/dL given premature ASCVD, however patient has not had repeat lipid panel since restarting high-intensity statin.  - Reviewed long term complications of uncontrolled cholesterol - Recommend to continue atorvastatin  40 mg PO daily  - Repeat lipid panel at upcoming PCP appointment   Follow Up Plan: Pharmacist in person 12/30/23, PCP 01/14/24   Arthea Larsson, PharmD PGY1 Pharmacy Resident

## 2023-12-31 ENCOUNTER — Other Ambulatory Visit

## 2023-12-31 DIAGNOSIS — E1169 Type 2 diabetes mellitus with other specified complication: Secondary | ICD-10-CM

## 2023-12-31 DIAGNOSIS — E785 Hyperlipidemia, unspecified: Secondary | ICD-10-CM | POA: Diagnosis not present

## 2023-12-31 DIAGNOSIS — E118 Type 2 diabetes mellitus with unspecified complications: Secondary | ICD-10-CM

## 2023-12-31 DIAGNOSIS — I5041 Acute combined systolic (congestive) and diastolic (congestive) heart failure: Secondary | ICD-10-CM

## 2024-01-01 ENCOUNTER — Ambulatory Visit: Payer: Self-pay

## 2024-01-01 ENCOUNTER — Other Ambulatory Visit (INDEPENDENT_AMBULATORY_CARE_PROVIDER_SITE_OTHER): Payer: Self-pay

## 2024-01-01 ENCOUNTER — Other Ambulatory Visit: Payer: Self-pay

## 2024-01-01 DIAGNOSIS — I10 Essential (primary) hypertension: Secondary | ICD-10-CM

## 2024-01-01 DIAGNOSIS — R7309 Other abnormal glucose: Secondary | ICD-10-CM

## 2024-01-01 DIAGNOSIS — E118 Type 2 diabetes mellitus with unspecified complications: Secondary | ICD-10-CM

## 2024-01-01 LAB — LIPID PANEL
Chol/HDL Ratio: 2.9 ratio (ref 0.0–5.0)
Cholesterol, Total: 146 mg/dL (ref 100–199)
HDL: 51 mg/dL
LDL Chol Calc (NIH): 82 mg/dL (ref 0–99)
Triglycerides: 64 mg/dL (ref 0–149)
VLDL Cholesterol Cal: 13 mg/dL (ref 5–40)

## 2024-01-01 LAB — BASIC METABOLIC PANEL WITH GFR
BUN/Creatinine Ratio: 10 (ref 9–20)
BUN: 12 mg/dL (ref 6–24)
CO2: 24 mmol/L (ref 20–29)
Calcium: 9.6 mg/dL (ref 8.7–10.2)
Chloride: 101 mmol/L (ref 96–106)
Creatinine, Ser: 1.2 mg/dL (ref 0.76–1.27)
Glucose: 179 mg/dL — ABNORMAL HIGH (ref 70–99)
Potassium: 5.3 mmol/L — ABNORMAL HIGH (ref 3.5–5.2)
Sodium: 145 mmol/L — ABNORMAL HIGH (ref 134–144)
eGFR: 75 mL/min/1.73

## 2024-01-01 LAB — HEMOGLOBIN A1C
Est. average glucose Bld gHb Est-mCnc: 240 mg/dL
Hgb A1c MFr Bld: 10 % — ABNORMAL HIGH (ref 4.8–5.6)

## 2024-01-01 MED ORDER — AMLODIPINE BESYLATE 10 MG PO TABS: 10.0000 mg | ORAL_TABLET | Freq: Every day | ORAL | 3 refills | Status: AC

## 2024-01-01 MED ORDER — METFORMIN HCL ER 500 MG PO TB24: 500.0000 mg | ORAL_TABLET | Freq: Two times a day (BID) | ORAL | 3 refills | Status: DC

## 2024-01-01 MED ORDER — LOSARTAN POTASSIUM 50 MG PO TABS: 50.0000 mg | ORAL_TABLET | Freq: Every day | ORAL | 3 refills | Status: AC

## 2024-01-01 MED ORDER — OZEMPIC (1 MG/DOSE) 4 MG/3ML ~~LOC~~ SOPN: 1.0000 mg | PEN_INJECTOR | SUBCUTANEOUS | 5 refills | Status: DC

## 2024-01-01 NOTE — Patient Instructions (Signed)
 Visit Information  Mr. Lezotte was given information about Medicaid Managed Care team care coordination services as a part of their [employer] Medicaid benefit. Elmore Corteze Santana verbally consentedto engagement with the [facility] Managed Care team.   If you are experiencing a medical emergency, please call 911 or report to your local emergency department or urgent care.   If you have a non-emergency medical problem during routine business hours, please contact your provider's office and ask to speak with a nurse.   For questions related to your [employer] [facility] health plan, please call: [phone]  OR visit the member homepage at: [email or web address]  If you would like to schedule transportation through your [employer] [facility] plan, please call the following number at least 2 days in advance of your appointment: [phone]  If you are experiencing a behavioral health crisis, call the [employer] [place]  Behavioral Health Crisis Line at [phone]). The line is available 24 hours a day, seven days a week.  If you would like help to quit smoking, call 1-800-QUIT-NOW ((803)230-5895) OR Espaol: 1-855-Djelo-Ya (2-440-102-7253) o para ms informacin haga clic aqu or Text READY to 664-403 to register via text   Mr. Summer - following are the goals we discussed in your visit today:    Goals Addressed             This Visit's Progress    BSW VBCI Social Work Care Plan   On track    Problems:   Food Insecurity  and applying to SSDI  CSW Clinical Goal(s):   Over the next 2 weeks the Patient will work with Child psychotherapist to address concerns related to food insecurity and applying to SSDI.  Interventions:  Pt will visit his local DSS office to apply for Food Stamps. SW will provide patient with local food pantries in his community to access while he secures a more sustainable  resource (food stamps). SW will do outreach to two organizations in [place] that may be able to help Pt apply for SSDI.   Patient Goals/Self-Care Activities:  Pt will visit local DSS office to apply for Food Stamps.  Plan:   Telephone follow up appointment with care management team member scheduled for:  01/15/2024 at 10AM.         Patient verbalizes understanding of instructions and care plan provided today and agrees to view in MyChart. Active MyChart status and patient understanding of how to access instructions and care plan via MyChart confirmed with patient.     Telephone follow up appointment with Managed Medicaid care management team member scheduled for: 01/15/2024 at 10AM.  Burt Casco, BSW [place]/VBCI - [facility] Social Worker [phone]   Following is a copy of your plan of care:  There are no care plans that you recently modified to display for this patient.

## 2024-01-01 NOTE — Progress Notes (Signed)
 01/01/2024 Name: Rodney Kane MRN: [id number] DOB: [birth year]  Chief Complaint  Patient presents with   Hypertension   Congestive Heart Failure   Diabetes   Hyperlipidemia    Rodney Kane is a 48 y.o. year old male who was referred for medication management by their primary care provider, [name], NP. They presented for telephone appointment today.   They were referred to the pharmacist by their PCP for assistance in managing diabetes. PMH includes HF (EF 45-50% in Aug 2023, LV global hypokinesis, G1DD), atrial fibrillation, HTN, CVA (2021), hx of VTE (2022), T2DM. Patient is following with complex care management for transportation needs, food insecurity, financial difficulties related to housing, and applying for disability. 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   Subjective:  Patient was initially outreached on 08/25/23 by Alexandria Angel, PharmD after patient had recently been seen in the ED 2/2 hyperglycemia and cellulitis. At in-person pharmacy appointment with me on 09/08/23, patient brought all his medication bottles and glucometer. Random BG were ranging 200-300s, with symptomatic hyperglycemia. Initiated insulin  glargine (Lantus ) 16 units daily and increased losartan  to 50 mg daily for elevated BP in the setting of HFrEF. Also restarted atorvastatin . At pharmacy f/u on 10/27/23, we initiated Ozempic , continued Lantus  and metformin , and stopped glipizide . Also recommended to start amlodipine  5 mg daily for elevated BP. Patient was seen by cardiology on 12/06/23. BP was still elevated and he was instructed to increase losartan  and metoprolol . He has also been reinitiated on a DOAC for stroke prevention given Afib dx. At pharmacy telephone appt on 12/08/23, patient reported post-prandial BG consistently > 200 mg/dL. He was having some GI upset with Ozempic , so we titrated Lantus  from 16 to 20 units daily. At last pharmacy telephone appt on 12/21/23 - patient  reported having one episode of feeling dizziness, feeling clammy, nausea, blurry vision when he was fishing and out in the sun for a while. He treated this with water and gatorade but reported that he continued to feel weak for a while. However, reported SMBG were all in the 200s. He was instructed to continue his current regimen, and we set up an appointment to initiated FL3+ CGM. His GI AE had improved after taking Ozempic  0.5 mg for a longer period of time.   Today patient reports that he has his supplies for the FL3+ and would like to apply it over the phone. He denies additional episodes of s/sx consistent with hypoglycemia. He reports that his BP continues to be elevated.   Care Team: Primary Care Provider: [name], NP ; Next Scheduled Visit: 01/14/24 Cardiology: Dr. Addie Holstein - no follow-up scheduled  Medication Access/Adherence  Current Pharmacy:  [facility] [address] [place] - [address] [place] [address] Phone: [phone] Fax: [phone]   Patient reports affordability concerns with their medications: Yes  Patient reports access/transportation concerns to their pharmacy: No  Patient reports adherence concerns with their medications:  No  - denies missed doses. Appears that metformin  XR has not been filled since March, but patient thinks he is taking.   Patient has been connected with Medicare Complex Care Management and SW for assistance applying for disability given residual deficits with speech.   Diabetes:  Current medications: metformin  500 XR BID (fill hx suggests he may not have a current supply), Lantus  (insulin  glargine) 20 units daily (~1PM), Ozempic  0.5 mg once weekly Medications tried in the past: Novolog , sitagliptin , and  linagliptin  in the past  Current glucose readings:   12/15/23: 193 12/16/23: 249 12/17/23: 239 12/18/23: 255 Using AccuChek Guide meter, but has supplies for FL3+ CGM to initiate today. He  will use his Iphone as the reader.   Patient denies s/sx of hypoglycemia. Denies polydipsia, polyphagia, nocturia, blurred vision.   Current meal patterns: Typically 1-2 meals/day around12-1PM and then in the evening 6-8PM. Reports that some days he does not eat any meals, but instead snacks throughout the day (like multiple granola bars). Trying to reduce drinking regular sodas, and trying to drink more water and diet/sugar free options.  - Reports that it is difficult for him to cut out sweets. He will go a couple days without eating sweets, but then will eat a lot at one time.  - Lunch: not consistent, varies - Supper: varies - Drinks water all throughout the day, has switched to diet/sugar-free sodas, koolaid punch zero sugar, minutemaid zero sugar drinks  Current physical activity: occasional walking, has a shake plate at home (core exercise). No other strength training reported.   Current medication access support: insurance - Medicaid  Hypertension:  Current medications: losartan  100 mg PO daily, metoprolol  100 mg daily, amlodipine  5 mg daily Medications previously tried: furosemide  40 mg daily PRN (he was instructed to stop this at pharmacy telephone appt on 08/25/23 as it was an old Rx), hydralazine   Patient has a validated, automated, upper arm home BP cuff.  Current blood pressure readings readings: BP at most recent wound visit on 12/28/23 was 162/95 after starting amlodipine .  Objective:  BP Readings from Last 3 Encounters:  12/07/23 (!) 156/84  10/27/23 (!) 168/109  10/01/23 (!) 164/102   Lab Results  Component Value Date   HGBA1C 10.0 (H) 12/31/2023   Per PCP note on 10/01/23 - in-office A1c was 13.6%, though does not appear that this was entered into order results.   eGFR > 60 mL/min, UACR 10/01/23: 4 mg/g  Lab Results  Component Value Date   CREATININE 1.20 12/31/2023   BUN 12 12/31/2023   NA 145 (H) 12/31/2023   K 5.3 (H) 12/31/2023   CL 101 12/31/2023   CO2  24 12/31/2023    Lab Results  Component Value Date   CHOL 146 12/31/2023   HDL 51 12/31/2023   LDLCALC 82 12/31/2023   TRIG 64 12/31/2023   CHOLHDL 2.9 12/31/2023    Medications Reviewed Today     Reviewed by Adra Alanis, RPH (Pharmacist) on 01/01/24 at 1425  Med List Status: <None>   Medication Order Taking? Sig Documenting Provider Last Dose Status Informant  amLODipine  (NORVASC ) 5 MG tablet [id number] Yes Take 1 tablet (5 mg total) by mouth daily. [name], NP  Active   apixaban  (ELIQUIS ) 5 MG TABS tablet [id number] Yes Take 1 tablet (5 mg total) by mouth 2 (two) times daily. [name], NP  Active   atorvastatin  (LIPITOR ) 40 MG tablet [id number] Yes Take 1 tablet (40 mg total) by mouth daily. [name], NP  Active   Blood Glucose Monitoring Suppl DEVI [id number]  1 each by Does not apply route in the morning, at noon, and at bedtime. May substitute to any manufacturer covered by patient's insurance. [name], NP  Consider Medication Status and Discontinue (No longer needed (for PRN medications))            Med Note [name] Dec 21, 2023  2:46 PM)    Continuous  Glucose Sensor (FREESTYLE LIBRE 3 PLUS SENSOR) MISC [id number] Yes Change sensor every 15 days. [name], NP  Active   Insulin  Pen Needle (PEN NEEDLES) 32G X 4 MM MISC [id number]  Use as directed to inject insulin  once daily Nichols, Tonya S, NP  Active   LANTUS  SOLOSTAR 100 UNIT/ML Solostar Pen [id number] Yes Inject 20 Units into the skin daily. May increase up to 30 units daily as instructed by your provider. [name], NP  Active            Med Note [name] Dec 21, 2023  2:46 PM) Taking 20 units daily  losartan  (COZAAR ) 100 MG tablet [id number] Yes Take 1 tablet (100 mg total) by mouth daily. Arleen Lacer, MD  Active   metFORMIN  (GLUCOPHAGE -XR) 500 MG 24 hr tablet [id number] Yes Take 1 tablet (500 mg total) by mouth 2 (two) times daily with a meal. [name], NP  Active            Med Note Adra Alanis   Mon Dec 21, 2023  2:46 PM)    metoprolol  succinate (TOPROL -XL) 100 MG 24 hr tablet [id number] Yes Take 1 tablet (100 mg total) by mouth daily. Take with or immediately following a meal. Arleen Lacer, MD  Active   Semaglutide ,0.25 or 0.5MG /DOS, 2 MG/3ML [name] [id number] Yes Inject 0.5 mg into the skin once a week. [name], NP  Active   tadalafil (CIALIS) 20 MG tablet [id number]  Take 20 mg by mouth daily as needed. [provider]  Active             Assessment/Plan:   Diabetes: - Currently uncontrolled with A1c 10% above goal < 7%, but improved from 15% in January 2025. S/sx of hyperglycemia have resolved, suggesting that addition of basal insulin  and GLP-1RA has resulted in improved glycemic control. Continues to be a great candidate for GLP-1RA therapy with ASCVD history and BMI > 40. Since he is tolerating Ozempic  0.5 mg weekly well, will increase to 1 mg weekly. Assisted patient in applying and initiating CGM over the phone today. Will provide refill of metformin  XR - kidney function is stable. Will plan to maximize metformin  dose at follow-up pending tolerability.  - Reviewed long term cardiovascular and renal outcomes of uncontrolled blood sugar - Reviewed goal A1c, goal fasting, and goal 2 hour post prandial glucose - Reviewed dietary modifications including increasing protein intake, avoiding carbohydrate heavy foods. Maintaining water intake and avoiding sugar-sweetened beverages. Educated extensively on lifestyle interventions to avoid GI upset with GLP-1RA.  - Reviewed lifestyle modifications including: increasing weight bearing exercise to avoid muscle loss with GLP-1RA.  - Recommend to increase Ozempic  (semaglutide ) 1 mg injected into the skin once weekly at next fill. Will collaborate with prescriber to place orders.  - Recommend to continue insulin  glargine (Lantus ) 20 units injected into the skin daily. -  Recommend to continue metformin  XR 500 mg PO BID  - Patient denies personal or family history of multiple endocrine neoplasia type 2, medullary thyroid  cancer; personal history of pancreatitis or gallbladder disease. - Recommend to monitor BG continuously with FL3+ CGM. Provided education on set-up and use over the phone today. Connected patient to LibreView.  - Next A1C due Sept 2025   Hypertension/HFrEF: - Currently uncontrolled above goal < 130/80 mmHg.  Noted elevated BP of 162/95 at wound care appt on 12/28/23. Patient had repeat BMP in clinic this week -  K remained mildly elevated at 5.3 mEq/L after increasing losartan  from 50 to 100 mg daily. Kidney function was stable and at baseline. Would recommend decreasing losartan  to 50 mg daily and maximizing amlodipine  for uncontrolled BP. Patient would be a good candidate to switch from losartan  to Entresto for HFrEF with close lab monitoring. Once A1C is better controlled, will consider SGLT2i. Will plan to repeat BMP at PCP follow-up in ~2 weeks.  - Reviewed long term cardiovascular and renal outcomes of uncontrolled blood pressure - Reviewed appropriate blood pressure monitoring technique and reviewed goal blood pressure. Recommended to check home blood pressure and heart rate a couple times per week.  - Recommend to decrease losartan  to 50 mg daily for elevated potassium - Recommend to increase amlodipine  to 10 mg daily  - Recommend to continue metoprolol  100 mg daily - Repeat BMP on 01/14/24 at PCP appt to monitor K and Scr   Hyperlipidemia/ASCVD Risk Reduction: - Currently uncontrolled with last LDL-C of 82 mg/dL above goal < 55 mg/dL given premature ASCVD, however - he has only been taking high-intensity statin since 12/10/23. He would likely be a good candidate for Repatha in the future, but did not discuss this today.  - Reviewed long term complications of uncontrolled cholesterol - Recommend to continue atorvastatin  40 mg PO daily     Follow Up Plan: PCP 01/14/24, Pharmacist telephone 02/10/24 (will set reminder to review LibreView in ~2 weeks)   Arthea Larsson, PharmD PGY1 Pharmacy Resident

## 2024-01-01 NOTE — Patient Outreach (Signed)
 Complex Care Management   Visit Note  01/01/2024  Name:  Rodney Kane MRN: [id number] DOB: 12/28/1975  Situation: Referral received for Complex Care Management related to SDOH Barriers:  Food insecurity Applying for SSDI.  I obtained verbal consent from Patient.  Visit completed with patient  on the phone  Background:   Past Medical History:  Diagnosis Date   Coronavirus infection 06/2020   COVID 06/2020   Diabetes mellitus without complication (HCC)    Hemoglobin A1C greater than 9%, indicating poor diabetic control 12/2019   Hyperglycemia 12/2019   Hyperlipidemia 12/2019   Hypertension    Hypertensive urgency 12/2019   Noncompliance with medication regimen 12/2019   Nonischemic (hypertensive) dilated cardiomyopathy (HCC) 12/2016   EF has been 30 to 35%, recently lower.   Stroke [facility])    Urine ketones 12/2019   Vitamin D  deficiency 12/2019    Assessment: SW met with Pt over the phone today. Pt has speech issues and stutters from a previous stroke, but was able to communicate without getting too frustrated or flustered. Patient was alert and cognitive. SDOH were assessed and the following needs were identified: Food insecurity and assistance with applying for SSDI. Patient confirms he use to receive food stamps but no longer does. Pt is unsure why they were cut off. Pt agreed to go to local DSS to re-apply for food stamps. SW will provide Pt a list of local food pantries for him to access while he works on securing a more sustainable option (food stamps). Pt's main concern/need is applying for SSDI. Pt confirmed he is not actively working and had two fingers amputated. Pt states he believes he might have applied for SSDI in 2021-2022 when he had his stroke, but was not sure. SW encouraged to re-apply. SW is waiting to hear from the [facility] (nonprofit) in [place] on rather they can assist Pt in applying for SSDI. SW and Pt agreed to follow-up as more  information is provided. Pt confirmed he does not have any other SDOH barriers and is currently caught up with his rent/utilities. SW also communicated to Pt resources will be provided to him via mail and e-mail. Pt understood and agreed. SW reminded Pt of his upcoming appointments and provided Pt with his direct phone #.   SDOH Interventions    Flowsheet Row Patient Outreach Telephone from 01/01/2024 in [place] POPULATION HEALTH DEPARTMENT Patient Outreach from 12/17/2023 in [place] POPULATION HEALTH DEPARTMENT Patient Outreach from 12/02/2023 in [place] POPULATION HEALTH DEPARTMENT Telephone from 11/11/2023 in [place] POPULATION HEALTH DEPARTMENT Telephone from 09/02/2023 in [place] POPULATION HEALTH DEPARTMENT Telephone from 08/27/2023 in  POPULATION HEALTH DEPARTMENT  SDOH Interventions        Food Insecurity Interventions Community Resources Provided Intervention Not Indicated -- Community Resources Provided, AMB Referral  [Provided food banks and also app for food] -- --  [Will get patient food stamp application and also send food banks resources]  Housing Interventions Intervention Not Indicated Intervention Not Indicated -- Intervention Not Indicated -- --  Transportation Interventions Intervention Not Indicated Intervention Not Indicated Intervention Not Indicated -- -- --  Utilities Interventions Intervention Not Indicated Intervention Not Indicated Intervention Not Indicated -- -- AMB Referral, Other (Comment)  [says he is all caught up on utilites mention LIEAP]  Depression Interventions/Treatment  -- Patient refuses Treatment -- -- -- --  Financial Strain Interventions Intervention Not Indicated -- -- -- [id number] Referral, Community Resources Provided  [employer] , Food stamp  application mailed to patient with food banks provided guidance on filing for disabilty] --      Recommendation:   Pt visit local DSS office to apply for food stamps. Pt was instructed on what  he needs to take t apply.   Follow Up Plan:   Telephone follow up appointment date/time:  01/15/2024 at 10AM.  Burt Casco, BSW [place]/[facility] - [facility] Social Worker [phone]

## 2024-01-04 DIAGNOSIS — I872 Venous insufficiency (chronic) (peripheral): Secondary | ICD-10-CM | POA: Diagnosis not present

## 2024-01-04 DIAGNOSIS — Z794 Long term (current) use of insulin: Secondary | ICD-10-CM | POA: Diagnosis not present

## 2024-01-04 DIAGNOSIS — Z8673 Personal history of transient ischemic attack (TIA), and cerebral infarction without residual deficits: Secondary | ICD-10-CM | POA: Diagnosis not present

## 2024-01-04 DIAGNOSIS — L97822 Non-pressure chronic ulcer of other part of left lower leg with fat layer exposed: Secondary | ICD-10-CM | POA: Diagnosis not present

## 2024-01-04 NOTE — Patient Outreach (Signed)
 SW called Pt to let him know he spoke with [place] with the [facility] in [place], [place]. SW let Pt know that Edman Gory confirmed they do assist individuals in applying for SSDI. However, he needs to schedule an appointment and bring required documentation to such appointment. Pt was made aware of this and was asked to call Latoria [phone] to move forward with scheduling an appointment and gathering necessary documentation. SW provided Pt phone number and encouraged him to call today. Pt agreed and understood.

## 2024-01-10 ENCOUNTER — Telehealth: Payer: Self-pay | Admitting: Nurse Practitioner

## 2024-01-10 NOTE — Telephone Encounter (Signed)
 Patient was identified as falling into the [place] Measure - Diabetes.   Patient was: Referred to pharmacy for chronic disease management.

## 2024-01-11 DIAGNOSIS — I872 Venous insufficiency (chronic) (peripheral): Secondary | ICD-10-CM | POA: Diagnosis not present

## 2024-01-11 DIAGNOSIS — Z8673 Personal history of transient ischemic attack (TIA), and cerebral infarction without residual deficits: Secondary | ICD-10-CM | POA: Diagnosis not present

## 2024-01-11 DIAGNOSIS — Z794 Long term (current) use of insulin: Secondary | ICD-10-CM | POA: Diagnosis not present

## 2024-01-11 DIAGNOSIS — L97822 Non-pressure chronic ulcer of other part of left lower leg with fat layer exposed: Secondary | ICD-10-CM | POA: Diagnosis not present

## 2024-01-13 ENCOUNTER — Other Ambulatory Visit: Payer: Self-pay | Admitting: Licensed Clinical Social Worker

## 2024-01-14 ENCOUNTER — Encounter: Payer: Self-pay | Admitting: Nurse Practitioner

## 2024-01-14 ENCOUNTER — Ambulatory Visit (INDEPENDENT_AMBULATORY_CARE_PROVIDER_SITE_OTHER): Payer: Self-pay | Admitting: Nurse Practitioner

## 2024-01-14 VITALS — BP 130/89 | HR 112 | Temp 97.0°F | Wt 341.0 lb

## 2024-01-14 DIAGNOSIS — L910 Hypertrophic scar: Secondary | ICD-10-CM | POA: Diagnosis not present

## 2024-01-14 NOTE — Patient Instructions (Signed)
 1. Keloid scar (Primary)  - Ambulatory referral to Dermatology

## 2024-01-14 NOTE — Progress Notes (Signed)
 Subjective   Patient ID: Rodney Kane, male    DOB: 1975-09-04, 48 y.o.   MRN: [id number]  Chief Complaint  Patient presents with   Diabetes    Referring provider: [name], NP  Rodney Kane is a 48 y.o. male with Past Medical History: 06/2020: Coronavirus infection 06/2020: COVID No date: Diabetes mellitus without complication (HCC) 12/2019: Hemoglobin A1C greater than 9%, indicating poor diabetic  control 12/2019: Hyperglycemia 12/2019: Hyperlipidemia No date: Hypertension 12/2019: Hypertensive urgency 12/2019: Noncompliance with medication regimen 12/2016: Nonischemic (hypertensive) dilated cardiomyopathy (HCC)     Comment:  EF has been 30 to 35%, recently lower. No date: Stroke [facility]) 12/2019: Urine ketones 12/2019: Vitamin D  deficiency   HPI  Patient presents today for diabetes and hypertension follow-up. Patient has been noncompliant with his medications and did not take medications today.    Hemoglobin A1c is improving at 10 today in office. Following with cardiology and neurology for history of stroke and A-fib. Following with pharmacy for medication management. Denies f/c/s, n/v/d, hemoptysis, PND, chest pain or edema.    No Known Allergies  Immunization History  Administered Date(s) Administered   Tdap 12/01/2017    Tobacco History: Social History   Tobacco Use  Smoking Status Never  Smokeless Tobacco Never   Counseling given: Not Answered   Outpatient Encounter Medications as of 01/14/2024  Medication Sig   amLODipine  (NORVASC ) 10 MG tablet Take 1 tablet (10 mg total) by mouth daily.   apixaban  (ELIQUIS ) 5 MG TABS tablet Take 1 tablet (5 mg total) by mouth 2 (two) times daily.   atorvastatin  (LIPITOR ) 40 MG tablet Take 1 tablet (40 mg total) by mouth daily.   Continuous Glucose Sensor (FREESTYLE LIBRE 3 PLUS SENSOR) MISC Change sensor every 15 days.   Insulin  Pen Needle (PEN NEEDLES) 32G X 4 MM MISC Use as directed to  inject insulin  once daily   LANTUS  SOLOSTAR 100 UNIT/ML Solostar Pen Inject 20 Units into the skin daily. May increase up to 30 units daily as instructed by your provider.   losartan  (COZAAR ) 50 MG tablet Take 1 tablet (50 mg total) by mouth daily.   metFORMIN  (GLUCOPHAGE -XR) 500 MG 24 hr tablet Take 1 tablet (500 mg total) by mouth 2 (two) times daily with a meal.   metoprolol  succinate (TOPROL -XL) 100 MG 24 hr tablet Take 1 tablet (100 mg total) by mouth daily. Take with or immediately following a meal.   Semaglutide , 1 MG/DOSE, (OZEMPIC , 1 MG/DOSE,) 4 MG/3ML SOPN Inject 1 mg into the skin once a week.   tadalafil (CIALIS) 20 MG tablet Take 20 mg by mouth daily as needed.   Blood Glucose Monitoring Suppl DEVI 1 each by Does not apply route in the morning, at noon, and at bedtime. May substitute to any manufacturer covered by patient's insurance. (Patient not taking: Reported on 01/14/2024)   No facility-administered encounter medications on file as of 01/14/2024.    Review of Systems  Review of Systems  Constitutional: Negative.   HENT: Negative.    Cardiovascular: Negative.   Gastrointestinal: Negative.   Allergic/Immunologic: Negative.   Neurological: Negative.   Psychiatric/Behavioral: Negative.       Objective:   BP 130/89   Pulse (!) 112   Temp (!) 97 F (36.1 C)   Wt (!) 341 lb (154.7 kg)   SpO2 100%   BMI 44.99 kg/m   Wt Readings from Last 5 Encounters:  01/14/24 (!) 341 lb (154.7 kg)  12/17/23 ROLLEN)  342 lb (155.1 kg)  12/07/23 (!) 336 lb 12.8 oz (152.8 kg)  10/01/23 (!) 341 lb (154.7 kg)  09/16/23 (!) 330 lb 3.2 oz (149.8 kg)     Physical Exam Vitals and nursing note reviewed.  Constitutional:      General: He is not in acute distress.    Appearance: He is well-developed.   Cardiovascular:     Rate and Rhythm: Normal rate and regular rhythm.  Pulmonary:     Effort: Pulmonary effort is normal.     Breath sounds: Normal breath sounds.   Skin:    General:  Skin is warm and dry.   Neurological:     Mental Status: He is alert and oriented to person, place, and time.       Assessment & Plan:   Keloid scar -     Ambulatory referral to Dermatology     Return in about 3 months (around 04/15/2024).     Bascom GORMAN Borer, NP 01/14/2024

## 2024-01-15 ENCOUNTER — Other Ambulatory Visit: Payer: Self-pay

## 2024-01-15 DIAGNOSIS — I10 Essential (primary) hypertension: Secondary | ICD-10-CM

## 2024-01-15 NOTE — Patient Instructions (Signed)
 Visit Information  Rodney Kane was given information about Medicaid Managed Care team care coordination services as a part of their [employer] Medicaid benefit. Rodney Kane verbally consentedto engagement with the [facility] Managed Care team.   If you are experiencing a medical emergency, please call 911 or report to your local emergency department or urgent care.   If you have a non-emergency medical problem during routine business hours, please contact your provider's office and ask to speak with a nurse.   For questions related to your [facility] [facility] health plan, please call: [phone]  OR visit the member homepage at: [email or web address]  If you would like to schedule transportation through your [facility] plan, please call the following number at least 2 days in advance of your appointment: [phone]  If you are experiencing a behavioral health crisis, call the [employer] [place]  Behavioral Health Crisis Line at [phone]). The line is available 24 hours a day, seven days a week.  If you would like help to quit smoking, call 1-800-QUIT-NOW (217-200-8030) OR Espaol: 1-855-Djelo-Ya (8-144-664-6430) o para ms informacin haga clic aqu or Text READY to 799-599 to register via text   [name] - following are the goals we discussed in your visit today:    Goals Addressed             This Visit's Progress    VBCI RN Care Plan- DM       Problems:  Chronic Disease Management support and education needs related to DMII  Goal: Over the next 30 days the Patient will attend all scheduled medical appointments: with PCP and specialist as evidenced by keeping al scheduled appointments        demonstrate Ongoing adherence to prescribed treatment plan for DM II as evidenced by no admissions to the hospital verbalize basic understanding of DM II disease  process and self health management plan as evidenced by verbal explanation lifestyle changes and consistent medication compliance   Interventions:   STOP LIGHT  Diabetes (DM) Definition: A condition in which there is a high level of sugar (glucose) in the blood. This may be caused by several factors.   Some Diabetes signs/symptoms:  Increased urination Excessive thirst and hunger Blurred vision and weakness Tingling in hands or feet Sores with possible infections that heal slowly or do not heal   Things you can do: Control your blood sugar, cholesterol and blood pressure through healthy eating habits and an active lifestyle Maintain an ideal body weight    Diabetes Interventions: Assessed patient's understanding of A1c goal: <7% Reviewed medications with patient and discussed importance of medication adherence Counseled on importance of regular laboratory monitoring as prescribed Discussed plans with patient for ongoing care management follow up and provided patient with direct contact information for care management team Provided patient with written educational materials related to hypo and hyperglycemia and importance of correct treatment Review of patient status, including review of consultants reports, relevant laboratory and other test results, and medications completed Screening for signs and symptoms of depression related to chronic disease state  Assessed social determinant of health barriers Lab Results  Component Value Date   HGBA1C 10.0 (H) 12/31/2023    Patient Self-Care Activities:  Attend all scheduled provider appointments Call pharmacy for medication refills 3-7 days in advance of running out of medications Call provider office for new concerns or questions  Perform all self care activities independently  Take medications as prescribed   keep appointment with  eye doctor check blood sugar at prescribed times: three times daily drink 6 to 8 glasses of  water each day limit fast food meals to no more than 1 per week  Plan:  Telephone follow up appointment with care management team member scheduled for:  7/29//25  115 pm          VBCI RN Care Plan-HTN       Problems:  Chronic Disease Management support and education needs related to HTN  Goal: Over the next 30 days the Patient will attend all scheduled medical appointments: with PCP and specialist as evidenced by keeping al scheduled appointments        demonstrate Ongoing adherence to prescribed treatment plan for HTN as evidenced by no admissions to the hospital verbalize basic understanding of HTN disease process and self health management plan as evidenced by verbal explanation lifestyle changes and consistent medication compliance   Interventions:   STOP LIGHT  Hypertension (HTN) or High Blood Pressure Definition: A condition in which the pressure in your arteries is higher than it should be. Blood pressure is written as two numbers such as 131/84. The top number represents the pressure in the arteries when the heart beats. The bottom number represents the pressure in the arteries in between heart beats.    Some Hypertension signs/symptoms:  It is very important to know that most people with high blood pressure experience no signs or symptoms. However, in the early stages of hypertension, some may experience the following symptoms: Dizzy spells or headaches Nosebleeds Flushing around the face Blurred vision or feeling confused Chest pain or abnormal heartbeat  Things you can do: Decrease the salt in your diet and consume healthy foods Increase your activity while maintaining a healthy weight Manage your stress Don't smoke Monitor your blood pressure at home with a blood pressure device     Hypertension Interventions: Last practice recorded BP readings:  BP Readings from Last 3 Encounters:  01/14/24 130/89  12/07/23 (!) 156/84  10/27/23 (!) 168/109   Most  recent eGFR/CrCl:  Lab Results  Component Value Date   EGFR 75 12/31/2023    No components found for: CRCL  Evaluation of current treatment plan related to hypertension self management and patient's adherence to plan as established by provider Provided education to patient re: stroke prevention, s/s of heart attack and stroke Reviewed medications with patient and discussed importance of compliance Counseled on the importance of exercise goals with target of 150 minutes per week Discussed plans with patient for ongoing care management follow up and provided patient with direct contact information for care management team Advised patient, providing education and rationale, to monitor blood pressure daily and record, calling PCP for findings outside established parameters Discussed complications of poorly controlled blood pressure such as heart disease, stroke, circulatory complications, vision complications, kidney impairment, sexual dysfunction Screening for signs and symptoms of depression related to chronic disease state  Assessed social determinant of health barriers  Patient Self-Care Activities:  Attend all scheduled provider appointments Call pharmacy for medication refills 3-7 days in advance of running out of medications Call provider office for new concerns or questions  Take medications as prescribed   check blood pressure daily write blood pressure results in a log or diary keep all doctor appointments take medications for blood pressure exactly as prescribed eat more whole grains, fruits and vegetables, lean meats and healthy fats  Plan:  Telephone follow up appointment with care management team member scheduled for:  02/16/24  115 pm             Please see education materials related to DM II/HTN provided by MyChart link.  Patient verbalizes understanding of instructions and care plan provided today and agrees to view in MyChart. Active MyChart status and patient  understanding of how to access instructions and care plan via MyChart confirmed with patient.     Telephone follow up appointment date/time:  02/12/24 115 pm    Wilbert Diver RN, BSN, [facility] [place]  [facility], [facility] Health    Care Coordinator Phone: [phone]      Following is a copy of your plan of care:  There are no care plans that you recently modified to display for this patient.

## 2024-01-15 NOTE — Patient Instructions (Signed)
 Visit Information  [name] was given information about Medicaid Managed Care team care coordination services as a part of their [employer] Medicaid benefit. Rodney Kane verbally consentedto engagement with the [facility] Managed Care team.   If you are experiencing a medical emergency, please call 911 or report to your local emergency department or urgent care.   If you have a non-emergency medical problem during routine business hours, please contact your provider's office and ask to speak with a nurse.   For questions related to your [employer] [facility] health plan, please call: [phone]  OR visit the member homepage at: [email or web address]  If you would like to schedule transportation through your [facility] plan, please call the following number at least 2 days in advance of your appointment: [phone]  If you are experiencing a behavioral health crisis, call the [employer] [place]  Behavioral Health Crisis Line at [phone]). The line is available 24 hours a day, seven days a week.  If you would like help to quit smoking, call 1-800-QUIT-NOW (7793839809) OR Espaol: 1-855-Djelo-Ya (8-144-664-6430) o para ms informacin haga clic aqu or Text READY to 799-599 to register via text   [name] - following are the goals we discussed in your visit today:    Goals Addressed   None     Patient verbalizes understanding of instructions and care plan provided today and agrees to view in MyChart. Active MyChart status and patient understanding of how to access instructions and care plan via MyChart confirmed with patient.     Telephone follow up appointment with Managed Medicaid care management team member scheduled for: 01/29/2024 11AM  [name], [place]/VBCI - [facility] Social Worker [phone]   Following is a copy of  your plan of care:  There are no care plans that you recently modified to display for this patient.

## 2024-01-15 NOTE — Patient Outreach (Signed)
 Complex Care Management   Visit Note  01/15/2024  Name:  Rodney Kane Treat MRN: [id number] DOB: 1975/12/04  Situation: Referral received for Complex Care Management related to assistance applying to SSDI. I obtained verbal consent from Patient.  Visit completed with patient  on the phone  Background:   Past Medical History:  Diagnosis Date   Coronavirus infection 06/2020   COVID 06/2020   Diabetes mellitus without complication (HCC)    Hemoglobin A1C greater than 9%, indicating poor diabetic control 12/2019   Hyperglycemia 12/2019   Hyperlipidemia 12/2019   Hypertension    Hypertensive urgency 12/2019   Noncompliance with medication regimen 12/2019   Nonischemic (hypertensive) dilated cardiomyopathy (HCC) 12/2016   EF has been 30 to 35%, recently lower.   Stroke [facility])    Urine ketones 12/2019   Vitamin D  deficiency 12/2019    Assessment: SW held follow up call with patient today. Pt was alert and cognitive. Pt confirmed he applied for food stamps at DSS office a couple days ago. Pt states he is currently not experiencing a food insecurity, but wishes to have food stamps since he does not have SSI or earned income. Pt also confirmed with SW that he spoke to [place] [phone] ext. [phone]) with [facility]) and has a scheduled appointment with her on 01/26/2024 to begin applying for SSDI. Pt confirmed he was explained what he needs to apply and confirmed he has gathered all documentation needed for his application. Patient did not recall time/location of his appointment, but was encouraged to reach out to [place] to confirm those details. SW asked patient about his recent appt with LCSW and Pt stated it went well. Pt confirmed with SW he received resources in the mail for food insecurity and was able to review and understand how to access them. Pt was asked if he had needed any additional info to resources and pt declined. SW advise patient he will follow up with  him to ensure his appointment with [facility] went well and probably close him out in next scheduled f/u call unless pt has other needs. Pt understood and agreed.   SDOH Interventions    Flowsheet Row Patient Outreach from 01/15/2024 in [place] POPULATION HEALTH DEPARTMENT Patient Outreach Telephone from 01/01/2024 in [place] POPULATION HEALTH DEPARTMENT Patient Outreach from 12/17/2023 in [place] POPULATION HEALTH DEPARTMENT Patient Outreach from 12/02/2023 in [place] POPULATION HEALTH DEPARTMENT Telephone from 11/11/2023 in [place] POPULATION HEALTH DEPARTMENT Telephone from 09/02/2023 in [place] POPULATION HEALTH DEPARTMENT  SDOH Interventions        Food Insecurity Interventions Community Resources Provided [employer] Provided Intervention Not Indicated -- [employer] Provided, AMB Referral  [Provided food banks and also app for food] --  Housing Interventions Intervention Not Indicated Intervention Not Indicated Intervention Not Indicated -- Intervention Not Indicated --  Transportation Interventions Intervention Not Indicated Intervention Not Indicated Intervention Not Indicated Intervention Not Indicated -- --  Utilities Interventions Intervention Not Indicated Intervention Not Indicated Intervention Not Indicated Intervention Not Indicated -- --  Depression Interventions/Treatment  -- -- Patient refuses Treatment -- -- --  Financial Strain Interventions Intervention Not Indicated Intervention Not Indicated -- -- -- [id number] Referral, Community Resources Provided  [employer] , Food stamp application mailed to patient with food banks provided guidance on filing for disabilty]    Recommendation:   Patient will call [facility] to confirm appt details and attend his scheduled appointment on 01/26/2024. Patient will update social worker on food stamps  application once he receives a response.    Follow Up Plan:   Telephone follow up appointment date/time:  01/29/2024  11AM  [name], BSW [place]/VBCI - [facility] Social Worker [phone]

## 2024-01-15 NOTE — Patient Outreach (Signed)
 Complex Care Management   Visit Note  01/15/2024  Name:  Rodney Kane MRN: [id number] DOB: 01-Jun-1976  Situation: Referral received for Complex Care Management related to Diabetes with Complications and HTN I obtained verbal consent from Patient.  Visit completed with patient  on the phone  Background:   Past Medical History:  Diagnosis Date   Coronavirus infection 06/2020   COVID 06/2020   Diabetes mellitus without complication (HCC)    Hemoglobin A1C greater than 9%, indicating poor diabetic control 12/2019   Hyperglycemia 12/2019   Hyperlipidemia 12/2019   Hypertension    Hypertensive urgency 12/2019   Noncompliance with medication regimen 12/2019   Nonischemic (hypertensive) dilated cardiomyopathy (HCC) 12/2016   EF has been 30 to 35%, recently lower.   Stroke [facility])    Urine ketones 12/2019   Vitamin D  deficiency 12/2019    Assessment: Patient Reported Symptoms:  Cognitive Cognitive Status: Able to follow simple commands, Alert and oriented to person, place, and time, Normal speech and language skills      Neurological Neurological Review of Symptoms: No symptoms reported    HEENT HEENT Symptoms Reported: No symptoms reported      Cardiovascular Cardiovascular Symptoms Reported: Swelling in legs or feet Cardiovascular Conditions: Heart failure, Dysrhythmia Cardiovascular Management Strategies: Medication therapy Cardiovascular Comment: patient states that he has flutters  Respiratory Respiratory Symptoms Reported: No symptoms reported    Endocrine Is patient diabetic?: Yes Is patient checking blood sugars at home?: Yes Endocrine Conditions: Diabetes Endocrine Management Strategies: Medication therapy, Medical device  Gastrointestinal Gastrointestinal Symptoms Reported: Constipation Additional Gastrointestinal Details: sometimes when taking ozempic  he will have constipation Gastrointestinal Conditions: Constipation Gastrointestinal Management Strategies:  Fluid modification, Diet modification, Medication therapy    Genitourinary Genitourinary Symptoms Reported: No symptoms reported    Integumentary Integumentary Symptoms Reported: No symptoms reported    Musculoskeletal Musculoskelatal Symptoms Reviewed: No symptoms reported   Falls in the past year?: No    Psychosocial       Quality of Family Relationships: supportive, involved Do you feel physically threatened by others?: No      01/15/2024    1:48 PM  Depression screen PHQ 2/9  Decreased Interest 1  Down, Depressed, Hopeless 0  PHQ - 2 Score 1    There were no vitals filed for this visit.  Medications Reviewed Today     Reviewed by Weyman Corning, RN (Registered Nurse) on 01/15/24 at 1329  Med List Status: <None>   Medication Order Taking? Sig Documenting Provider Last Dose Status Informant  amLODipine  (NORVASC ) 10 MG tablet [id number] Yes Take 1 tablet (10 mg total) by mouth daily. Paseda, Folashade R, FNP  Active   apixaban  (ELIQUIS ) 5 MG TABS tablet [id number] Yes Take 1 tablet (5 mg total) by mouth 2 (two) times daily. [name], NP  Active   atorvastatin  (LIPITOR ) 40 MG tablet [id number] Yes Take 1 tablet (40 mg total) by mouth daily. [name], NP  Active   Blood Glucose Monitoring Suppl DEVI [id number]  1 each by Does not apply route in the morning, at noon, and at bedtime. May substitute to any manufacturer covered by patient's insurance.  Patient not taking: Reported on 01/15/2024   [name], NP  Active            Med Note DELSA LORAIN SHAUNNA Pablo Dec 21, 2023  2:46 PM)    Continuous Glucose Sensor (FREESTYLE LIBRE 3 PLUS SENSOR) [place] [id number] Yes Change sensor every 15  days. [name], NP  Active   Insulin  Pen Needle (PEN NEEDLES) 32G X 4 MM MISC [id number] Yes Use as directed to inject insulin  once daily Nichols, Tonya S, NP  Active   LANTUS  SOLOSTAR 100 UNIT/ML Solostar Pen [id number] Yes Inject 20 Units into the skin daily. May increase up to 30  units daily as instructed by your provider. [name], NP  Active            Med Note DELSA LORAIN SHAUNNA Pablo Dec 21, 2023  2:46 PM) Taking 20 units daily  losartan  (COZAAR ) 50 MG tablet [id number] Yes Take 1 tablet (50 mg total) by mouth daily.  Patient taking differently: Take 50 mg by mouth daily. Taking 100 mg once daily   Paseda, Folashade R, FNP  Active   metFORMIN  (GLUCOPHAGE -XR) 500 MG 24 hr tablet [id number] Yes Take 1 tablet (500 mg total) by mouth 2 (two) times daily with a meal. Paseda, Folashade R, FNP  Active   metoprolol  succinate (TOPROL -XL) 100 MG 24 hr tablet [id number] Yes Take 1 tablet (100 mg total) by mouth daily. Take with or immediately following a meal. Anner Alm ORN, MD  Active   Semaglutide , 1 MG/DOSE, (OZEMPIC , 1 MG/DOSE,) 4 MG/3ML SOPN [id number] Yes Inject 1 mg into the skin once a week. Paseda, Folashade R, FNP  Active   tadalafil (CIALIS) 20 MG tablet [id number] Yes Take 20 mg by mouth daily as needed. [provider]  Active             Recommendation:   PCP Follow-up  Follow Up Plan:   Telephone follow up appointment date/time:  02/12/24 115 pm  Wilbert Diver RN, BSN, [facility] [place]  [facility], [facility] Health    Care Coordinator Phone: [phone]

## 2024-01-18 DIAGNOSIS — Z794 Long term (current) use of insulin: Secondary | ICD-10-CM | POA: Diagnosis not present

## 2024-01-18 DIAGNOSIS — Z8673 Personal history of transient ischemic attack (TIA), and cerebral infarction without residual deficits: Secondary | ICD-10-CM | POA: Diagnosis not present

## 2024-01-18 DIAGNOSIS — L97822 Non-pressure chronic ulcer of other part of left lower leg with fat layer exposed: Secondary | ICD-10-CM | POA: Diagnosis not present

## 2024-01-18 DIAGNOSIS — I872 Venous insufficiency (chronic) (peripheral): Secondary | ICD-10-CM | POA: Diagnosis not present

## 2024-01-21 NOTE — Patient Instructions (Signed)
 Visit Information  [name] was given information about Medicaid Managed Care team care coordination services as a part of their [employer] Medicaid benefit. Rodney Kane verbally consentedto engagement with the [facility] Managed Care team.   If you are experiencing a medical emergency, please call 911 or report to your local emergency department or urgent care.   If you have a non-emergency medical problem during routine business hours, please contact your provider's office and ask to speak with a nurse.   For questions related to your [facility] health plan, please call: [phone]  OR visit the member homepage at: [email or web address]  If you would like to schedule transportation through your [facility] plan, please call the following number at least 2 days in advance of your appointment: [phone]  If you are experiencing a behavioral health crisis, call the [employer] [place]  Behavioral Health Crisis Line at [phone]). The line is available 24 hours a day, seven days a week.  If you would like help to quit smoking, call 1-800-QUIT-NOW (514-065-2923) OR Espaol: 1-855-Djelo-Ya (8-144-664-6430) o para ms informacin haga clic aqu or Text READY to 799-599 to register via text   Rodney Kane - following are the goals we discussed in your visit today:    Goals Addressed             This Visit's Progress    LCSW VBCI Social Work Care Plan   On track    Problems:   Stress  CSW Clinical Goal(s):   Over the next 60 days the Patient will attend all scheduled medical appointments as evidenced by patient report and care team review of appointment completion in electronic MEDICAL RECORD NUMBER  demonstrate a reduction in symptoms related to Stress at applying for Disabilty .  Interventions:  Mental Health:  Evaluation of current treatment plan  related to Stress at applying for Disability Active listening / Reflection utilized [employer] / information provided Emotional Support Provided Solution-Focued Strategies employed: Pt has heard from [facility]; however, needs to order a new SS card for upcoming appt scheduled on 07/08  Patient Goals/Self-Care Activities:  Increase coping skills and healthy habits  Plan:   Telephone follow up appointment with care management team member scheduled for:  2-4 weeks        Please see education materials related to topics discussed provided by MyChart link.  Patient verbalizes understanding of instructions and care plan provided today and agrees to view in MyChart. Active MyChart status and patient understanding of how to access instructions and care plan via MyChart confirmed with patient.     Licensed Clinical Social Worker will 07/24 at 3:30 PM  Rolin Kerns, LCSW [place]  [facility], [facility] Clinical Social Worker Direct Dial: [phone]  Fax: [phone] Website: [email or web address] 8:22 AM   Following is a copy of your plan of care:  There are no care plans that you recently modified to display for this patient.

## 2024-01-21 NOTE — Patient Outreach (Signed)
 Complex Care Management   Visit Note  01/13/2024  Name:  Rodney Kane MRN: [id number] DOB: 1976/03/16  Situation: Referral received for Complex Care Management related to Stress I obtained verbal consent from Patient.  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   Stroke [facility])    Urine ketones 12/2019   Vitamin D  deficiency 12/2019    Assessment: Patient Reported Symptoms:  Cognitive Cognitive Status: Alert and oriented to person, place, and time      Neurological Neurological Review of Symptoms: Not assessed    HEENT HEENT Symptoms Reported: Not assessed      Cardiovascular Cardiovascular Symptoms Reported: Not assessed    Respiratory Respiratory Symptoms Reported: Not assesed    Endocrine Endocrine Symptoms Reported: Not assessed    Gastrointestinal Gastrointestinal Symptoms Reported: Not assessed      Genitourinary Genitourinary Symptoms Reported: Not assessed    Integumentary Integumentary Symptoms Reported: Not assessed    Musculoskeletal Musculoskelatal Symptoms Reviewed: Not assessed        Psychosocial Psychosocial Symptoms Reported: No symptoms reported            01/15/2024    1:48 PM  Depression screen PHQ 2/9  Decreased Interest 1  Down, Depressed, Hopeless 0  PHQ - 2 Score 1    There were no vitals filed for this visit.  Medications Reviewed Today   Medications were not reviewed in this encounter     Recommendation:   Continue Current Plan of Care  Follow Up  Plan:   Telephone follow-up in 1 month  Rolin Kerns, LCSW [facility]  [facility], [facility] Clinical Social Worker Direct Dial: [phone]  Fax: [phone] Website: [email or web address] 8:19 AM

## 2024-01-25 DIAGNOSIS — Z7984 Long term (current) use of oral hypoglycemic drugs: Secondary | ICD-10-CM | POA: Diagnosis not present

## 2024-01-25 DIAGNOSIS — Z79899 Other long term (current) drug therapy: Secondary | ICD-10-CM | POA: Diagnosis not present

## 2024-01-25 DIAGNOSIS — E1122 Type 2 diabetes mellitus with diabetic chronic kidney disease: Secondary | ICD-10-CM | POA: Diagnosis not present

## 2024-01-25 DIAGNOSIS — L97822 Non-pressure chronic ulcer of other part of left lower leg with fat layer exposed: Secondary | ICD-10-CM | POA: Diagnosis not present

## 2024-01-25 DIAGNOSIS — N189 Chronic kidney disease, unspecified: Secondary | ICD-10-CM | POA: Diagnosis not present

## 2024-01-25 DIAGNOSIS — Z794 Long term (current) use of insulin: Secondary | ICD-10-CM | POA: Diagnosis not present

## 2024-01-25 DIAGNOSIS — I129 Hypertensive chronic kidney disease with stage 1 through stage 4 chronic kidney disease, or unspecified chronic kidney disease: Secondary | ICD-10-CM | POA: Diagnosis not present

## 2024-01-25 DIAGNOSIS — I872 Venous insufficiency (chronic) (peripheral): Secondary | ICD-10-CM | POA: Diagnosis not present

## 2024-01-25 DIAGNOSIS — I87312 Chronic venous hypertension (idiopathic) with ulcer of left lower extremity: Secondary | ICD-10-CM | POA: Diagnosis not present

## 2024-01-25 DIAGNOSIS — Z8673 Personal history of transient ischemic attack (TIA), and cerebral infarction without residual deficits: Secondary | ICD-10-CM | POA: Diagnosis not present

## 2024-01-25 DIAGNOSIS — I4891 Unspecified atrial fibrillation: Secondary | ICD-10-CM | POA: Diagnosis not present
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
## 2024-01-29 ENCOUNTER — Other Ambulatory Visit: Payer: Self-pay

## 2024-01-29 NOTE — Patient Instructions (Signed)
 Visit Information  Mr. Kraeger was given information about Medicaid Managed Care team care coordination services as a part of their [employer] Medicaid benefit. Rodriguez Corteze Pavich verbally consentedto engagement with the [facility] Managed Care team.   If you are experiencing a medical emergency, please call 911 or report to your local emergency department or urgent care.   If you have a non-emergency medical problem during routine business hours, please contact your provider's office and ask to speak with a nurse.   For questions related to your [employer] [facility] health plan, please call: [phone]  OR visit the member homepage at: [email or web address]  If you would like to schedule transportation through your [employer] [facility] plan, please call the following number at least 2 days in advance of your appointment: [phone]  If you are experiencing a behavioral health crisis, call the [employer] [place]  Behavioral Health Crisis Line at 1-4786104712 [phone]). The line is available 24 hours a day, seven days a week.  If you would like help to quit smoking, call 1-800-QUIT-NOW ((332)542-3422) OR Espaol: 1-855-Djelo-Ya (8-144-664-6430) o para ms informacin haga clic aqu or Text READY to 799-599 to register via text   Mr. Engelstad - following are the goals we discussed in your visit today:    Goals Addressed             This Visit's Progress    BSW VBCI Social Work Care Plan   On track    Problems:   Food Insecurity  and applying to SSDI  CSW Clinical Goal(s):   Over the next 2 weeks the Patient will work with Child psychotherapist to address concerns related to food insecurity and applying to SSDI.  Interventions:  Pt will visit his local DSS office to apply for Food Stamps. SW will provide patient with local food pantries in his community to access while he secures a more sustainable  resource (food stamps). SW will do outreach to two organizations in [place] that may be able to help Pt apply for SSDI.   Patient Goals/Self-Care Activities:  Pt will visit local DSS office to apply for Food Stamps.  Plan:   Telephone follow up appointment with care management team member scheduled for:  01/15/2024 at 10AM.         Patient verbalizes understanding of instructions and care plan provided today and agrees to view in MyChart. Active MyChart status and patient understanding of how to access instructions and care plan via MyChart confirmed with patient.     Telephone follow up appointment with Managed Medicaid care management team member scheduled for: 02/12/2024 at 11AM  [name], [place]/VBCI - [facility] Social Worker [phone]   Following is a copy of your plan of care:  There are no care plans that you recently modified to display for this patient.

## 2024-01-29 NOTE — Patient Outreach (Signed)
 Complex Care Management   Visit Note  01/29/2024  Name:  Rodney Kane MRN: [id number] DOB: 1975-09-22  Situation: Referral received for Complex Care Management related to SDOH Barriers:  assistance applying for disability benefit I obtained verbal consent from Patient.  Visit completed with patient  on the phone  Background:   Past Medical History:  Diagnosis Date   Coronavirus infection 06/2020   COVID 06/2020   Diabetes mellitus without complication (HCC)    Hemoglobin A1C greater than 9%, indicating poor diabetic control 12/2019   Hyperglycemia 12/2019   Hyperlipidemia 12/2019   Hypertension    Hypertensive urgency 12/2019   Noncompliance with medication regimen 12/2019   Nonischemic (hypertensive) dilated cardiomyopathy (HCC) 12/2016   EF has been 30 to 35%, recently lower.   Stroke [facility])    Urine ketones 12/2019   Vitamin D  deficiency 12/2019    Assessment: BSW conducted f/u call with patient. Patient was alert and cognitive. Patient reports he has still not heard back from [place] with the [facility]. Patient and BSW called DAC together while on the phone. Receptionist informed patient and BSW that she would pass along our message and contact info and have Latoria call us  back. Patient and BSW agreed and contact info to both was provided.   Patient reports he does not have any SDOH needs at the moment. Patient confirmed he applied for food stamps and is receiving [employer] benefit. Patient reports no issues using EBT card. Patient also confirmed he received BSW resources for food and will access them as needed in the future. No other needs at this time. BSW and patient held conversation regarding extra benefits with [employer] and provided instructions for Medicaid Transportation via email for future use. BSW and patient went over upcoming appointments.   SDOH Interventions    Flowsheet Row Patient Outreach Telephone from 01/29/2024 in [place] POPULATION  HEALTH DEPARTMENT Most recent reading at 01/29/2024 11:22 AM Patient Outreach Telephone from 01/15/2024 in [place] POPULATION HEALTH DEPARTMENT Most recent reading at 01/15/2024  1:30 PM Patient Outreach from 01/15/2024 in [place] POPULATION HEALTH DEPARTMENT Most recent reading at 01/15/2024 10:18 AM Patient Outreach Telephone from 01/01/2024 in [place] POPULATION HEALTH DEPARTMENT Most recent reading at 01/01/2024 10:33 AM Patient Outreach from 12/17/2023 in [place] POPULATION HEALTH DEPARTMENT Most recent reading at 12/17/2023  1:22 PM Patient Outreach from 12/02/2023 in  POPULATION HEALTH DEPARTMENT Most recent reading at 12/02/2023 10:47 AM  SDOH Interventions        Food Insecurity Interventions Intervention Not Indicated  [Patient has food resources sent to him via email and mail and states he is fine with those at the moment.] Intervention Not Indicated Community Resources Provided [employer] Provided Intervention Not Indicated --  Housing Interventions Intervention Not Indicated Intervention Not Indicated Intervention Not Indicated Intervention Not Indicated Intervention Not Indicated --  Transportation Interventions Intervention Not Indicated  [patient has own personal transportation] Intervention Not Indicated Intervention Not Indicated Intervention Not Indicated Intervention Not Indicated Intervention Not Indicated  Utilities Interventions Intervention Not Indicated Intervention Not Indicated Intervention Not Indicated Intervention Not Indicated Intervention Not Indicated Intervention Not Indicated  Depression Interventions/Treatment  -- -- -- -- Patient refuses Treatment --  Financial Strain Interventions Intervention Not Indicated -- Intervention Not Indicated Intervention Not Indicated -- --    Recommendation:   Continue appointment adherence.   Follow Up Plan:   Telephone follow up appointment date/time:  02/12/2024 at 11AM.  [name],  BSW [facility]/VBCI - [facility] Social Worker [phone]

## 2024-02-03 DIAGNOSIS — I87312 Chronic venous hypertension (idiopathic) with ulcer of left lower extremity: Secondary | ICD-10-CM | POA: Diagnosis not present

## 2024-02-03 DIAGNOSIS — I129 Hypertensive chronic kidney disease with stage 1 through stage 4 chronic kidney disease, or unspecified chronic kidney disease: Secondary | ICD-10-CM | POA: Diagnosis not present

## 2024-02-03 DIAGNOSIS — Z7984 Long term (current) use of oral hypoglycemic drugs: Secondary | ICD-10-CM | POA: Diagnosis not present

## 2024-02-03 DIAGNOSIS — I872 Venous insufficiency (chronic) (peripheral): Secondary | ICD-10-CM | POA: Diagnosis not present

## 2024-02-03 DIAGNOSIS — I4891 Unspecified atrial fibrillation: Secondary | ICD-10-CM | POA: Diagnosis not present

## 2024-02-03 DIAGNOSIS — Z8673 Personal history of transient ischemic attack (TIA), and cerebral infarction without residual deficits: Secondary | ICD-10-CM | POA: Diagnosis not present

## 2024-02-03 DIAGNOSIS — N189 Chronic kidney disease, unspecified: Secondary | ICD-10-CM | POA: Diagnosis not present

## 2024-02-03 DIAGNOSIS — Z794 Long term (current) use of insulin: Secondary | ICD-10-CM | POA: Diagnosis not present

## 2024-02-03 DIAGNOSIS — Z79899 Other long term (current) drug therapy: Secondary | ICD-10-CM | POA: Diagnosis not present

## 2024-02-03 DIAGNOSIS — L97822 Non-pressure chronic ulcer of other part of left lower leg with fat layer exposed: Secondary | ICD-10-CM | POA: Diagnosis not present

## 2024-02-03 DIAGNOSIS — E1122 Type 2 diabetes mellitus with diabetic chronic kidney disease: Secondary | ICD-10-CM | POA: Diagnosis not present

## 2024-02-09 DIAGNOSIS — I129 Hypertensive chronic kidney disease with stage 1 through stage 4 chronic kidney disease, or unspecified chronic kidney disease: Secondary | ICD-10-CM | POA: Diagnosis not present

## 2024-02-09 DIAGNOSIS — Z79899 Other long term (current) drug therapy: Secondary | ICD-10-CM | POA: Diagnosis not present

## 2024-02-09 DIAGNOSIS — I4891 Unspecified atrial fibrillation: Secondary | ICD-10-CM | POA: Diagnosis not present

## 2024-02-09 DIAGNOSIS — Z8673 Personal history of transient ischemic attack (TIA), and cerebral infarction without residual deficits: Secondary | ICD-10-CM | POA: Diagnosis not present

## 2024-02-09 DIAGNOSIS — E1122 Type 2 diabetes mellitus with diabetic chronic kidney disease: Secondary | ICD-10-CM | POA: Diagnosis not present

## 2024-02-09 DIAGNOSIS — Z794 Long term (current) use of insulin: Secondary | ICD-10-CM | POA: Diagnosis not present

## 2024-02-09 DIAGNOSIS — I87312 Chronic venous hypertension (idiopathic) with ulcer of left lower extremity: Secondary | ICD-10-CM | POA: Diagnosis not present

## 2024-02-09 DIAGNOSIS — N189 Chronic kidney disease, unspecified: Secondary | ICD-10-CM | POA: Diagnosis not present

## 2024-02-09 DIAGNOSIS — Z7984 Long term (current) use of oral hypoglycemic drugs: Secondary | ICD-10-CM | POA: Diagnosis not present

## 2024-02-09 DIAGNOSIS — I872 Venous insufficiency (chronic) (peripheral): Secondary | ICD-10-CM | POA: Diagnosis not present

## 2024-02-09 DIAGNOSIS — L97822 Non-pressure chronic ulcer of other part of left lower leg with fat layer exposed: Secondary | ICD-10-CM | POA: Diagnosis not present

## 2024-02-10 ENCOUNTER — Other Ambulatory Visit: Payer: Self-pay

## 2024-02-11 ENCOUNTER — Other Ambulatory Visit: Payer: Self-pay | Admitting: Licensed Clinical Social Worker

## 2024-02-12 ENCOUNTER — Other Ambulatory Visit: Payer: Self-pay

## 2024-02-12 NOTE — Patient Outreach (Addendum)
 Complex Care Management   Visit Note  02/12/2024  Name:  Rodney Kane MRN: [id number] DOB: 02-Apr-1976  Situation: Referral received for Complex Care Management related to SSDI application assistance I obtained verbal consent from Patient.  Visit completed with patient  on the phone  Background:   Past Medical History:  Diagnosis Date   Coronavirus infection 06/2020   COVID 06/2020   Diabetes mellitus without complication (HCC)    Hemoglobin A1C greater than 9%, indicating poor diabetic control 12/2019   Hyperglycemia 12/2019   Hyperlipidemia 12/2019   Hypertension    Hypertensive urgency 12/2019   Noncompliance with medication regimen 12/2019   Nonischemic (hypertensive) dilated cardiomyopathy (HCC) 12/2016   EF has been 30 to 35%, recently lower.   Stroke [facility])    Urine ketones 12/2019   Vitamin D  deficiency 12/2019    Assessment:  BSW held f/u call with patient. Patient was alert and cognitive. Patient confirmed he received email regarding Medicaid transportation and how to set that up for future reference should he need it. Patient confirmed for now he has hs own transportation. Patient also reports no issues with his EBT card, but needed assistance in checking balance. BSW helped patient check balance over the phone. Patient reports he still has not heard back from [place] with the [facility]. Patient expressed some frustration at the fact that they won't reach out. BSW and patient called DAC together while on the phone and were told Latoria would give us  a call back today. Patient understood and BSW left a VM at her extension [phone] ext. [phone] requesting call back. Patient agreed to try this again before deciding to explore other options to apply. No additional resources were provided/requested at this time.   *later in the day*  BSW received an incoming call from [place] with DAC. Cornelius states she will have to speak with her supervisor as she is the one who handles  application assistance and she handles the intake piece. Latoria agreed to follow-up with patient and BSW as soon as she knows more information regarding a next available appointment. BSW requested her to outreach patient to provide the same update so he is in the loop.   BSW called patient to confirm update and patient reports he spoke with Latoria as well. Per patient, he will be receivivng a call from [place] and her supervisor on Monday to schedule an appointment for application assistance.   SDOH Interventions    Flowsheet Row Patient Outreach Telephone from 01/29/2024 in [place] POPULATION HEALTH DEPARTMENT Most recent reading at 01/29/2024 11:22 AM Patient Outreach Telephone from 01/15/2024 in [place] POPULATION HEALTH DEPARTMENT Most recent reading at 01/15/2024  1:30 PM Patient Outreach from 01/15/2024 in [place] POPULATION HEALTH DEPARTMENT Most recent reading at 01/15/2024 10:18 AM Patient Outreach Telephone from 01/01/2024 in [place] POPULATION HEALTH DEPARTMENT Most recent reading at 01/01/2024 10:33 AM Patient Outreach from 12/17/2023 in [place] POPULATION HEALTH DEPARTMENT Most recent reading at 12/17/2023  1:22 PM Patient Outreach from 12/02/2023 in [place] POPULATION HEALTH DEPARTMENT Most recent reading at 12/02/2023 10:47 AM  SDOH Interventions        Food Insecurity Interventions Intervention Not Indicated  [Patient has food resources sent to him via email and mail and states he is fine with those at the moment.] Intervention Not Indicated Community Resources Provided [employer] Provided Intervention Not Indicated --  Housing Interventions Intervention Not Indicated Intervention Not Indicated Intervention Not Indicated Intervention Not Indicated Intervention Not Indicated --  Transportation Interventions Intervention Not Indicated  [patient has own personal transportation] Intervention Not Indicated Intervention Not Indicated Intervention Not Indicated  Intervention Not Indicated Intervention Not Indicated  Utilities Interventions Intervention Not Indicated Intervention Not Indicated Intervention Not Indicated Intervention Not Indicated Intervention Not Indicated Intervention Not Indicated  Depression Interventions/Treatment  -- -- -- -- Patient refuses Treatment --  Financial Strain Interventions Intervention Not Indicated -- Intervention Not Indicated Intervention Not Indicated -- --      Recommendation:   None  Follow Up Plan:   Telephone follow up appointment date/time:  02/22/2024 at 11AM  [name], BSW [place]/VBCI - [facility] Social Worker [phone]

## 2024-02-15 DIAGNOSIS — Z7984 Long term (current) use of oral hypoglycemic drugs: Secondary | ICD-10-CM | POA: Diagnosis not present

## 2024-02-15 DIAGNOSIS — Z794 Long term (current) use of insulin: Secondary | ICD-10-CM | POA: Diagnosis not present

## 2024-02-15 DIAGNOSIS — Z79899 Other long term (current) drug therapy: Secondary | ICD-10-CM | POA: Diagnosis not present

## 2024-02-15 DIAGNOSIS — L97822 Non-pressure chronic ulcer of other part of left lower leg with fat layer exposed: Secondary | ICD-10-CM | POA: Diagnosis not present

## 2024-02-15 DIAGNOSIS — Z8673 Personal history of transient ischemic attack (TIA), and cerebral infarction without residual deficits: Secondary | ICD-10-CM | POA: Diagnosis not present

## 2024-02-15 DIAGNOSIS — I872 Venous insufficiency (chronic) (peripheral): Secondary | ICD-10-CM | POA: Diagnosis not present

## 2024-02-15 DIAGNOSIS — I87312 Chronic venous hypertension (idiopathic) with ulcer of left lower extremity: Secondary | ICD-10-CM | POA: Diagnosis not present

## 2024-02-15 DIAGNOSIS — N189 Chronic kidney disease, unspecified: Secondary | ICD-10-CM | POA: Diagnosis not present

## 2024-02-15 DIAGNOSIS — E1122 Type 2 diabetes mellitus with diabetic chronic kidney disease: Secondary | ICD-10-CM | POA: Diagnosis not present

## 2024-02-15 DIAGNOSIS — I129 Hypertensive chronic kidney disease with stage 1 through stage 4 chronic kidney disease, or unspecified chronic kidney disease: Secondary | ICD-10-CM | POA: Diagnosis not present

## 2024-02-15 DIAGNOSIS — I4891 Unspecified atrial fibrillation: Secondary | ICD-10-CM | POA: Diagnosis not present

## 2024-02-15 NOTE — Patient Outreach (Signed)
 Complex Care Management   Visit Note  02/11/2024  Name:  Rodney Kane MRN: [id number] DOB: [birth year]  Situation: Referral received for Complex Care Management related to Stress I obtained verbal consent from Patient.  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   Stroke [facility])    Urine ketones 12/2019   Vitamin D  deficiency 12/2019    Assessment: Patient Reported Symptoms:  Cognitive Cognitive Status: Alert and oriented to person, place, and time      Neurological Neurological Review of Symptoms: Not assessed    HEENT HEENT Symptoms Reported: Not assessed      Cardiovascular Cardiovascular Symptoms Reported: Not assessed    Respiratory Respiratory Symptoms Reported: Not assesed    Endocrine Endocrine Symptoms Reported: Not assessed    Gastrointestinal Gastrointestinal Symptoms Reported: Not assessed      Genitourinary Genitourinary Symptoms Reported: Not assessed    Integumentary Integumentary Symptoms Reported: Not assessed    Musculoskeletal Musculoskelatal Symptoms Reviewed: Not assessed        Psychosocial Psychosocial Symptoms Reported: No symptoms reported            01/15/2024    1:48 PM  Depression screen PHQ 2/9  Decreased Interest 1  Down, Depressed, Hopeless 0  PHQ - 2 Score 1    There were no vitals filed for this visit.  Medications Reviewed Today     Reviewed by Kathy Wahid D, LCSW (Social Worker) on 02/15/24 at 1411  Med List Status: <None>   Medication  Order Taking? Sig Documenting Provider Last Dose Status Informant  amLODipine  (NORVASC ) 10 MG tablet [id number]  Take 1 tablet (10 mg total) by mouth daily. Paseda, Folashade R, FNP  Active   apixaban  (ELIQUIS ) 5 MG TABS tablet [id number]  Take 1 tablet (5 mg total) by mouth 2 (two) times daily. [name], NP  Active   atorvastatin  (LIPITOR ) 40 MG tablet [id number]  Take 1 tablet (40 mg total) by mouth daily. [name], NP  Active   Blood Glucose Monitoring Suppl DEVI [id number]  1 each by Does not apply route in the morning, at noon, and at bedtime. May substitute to any manufacturer covered by patient's insurance.  Patient not taking: Reported on 01/15/2024   [name], NP  Active            Med Note DELSA LORAIN SHAUNNA Pablo Dec 21, 2023  2:46 PM)    Continuous Glucose Sensor (FREESTYLE LIBRE 3 PLUS SENSOR) [place] [id number]  Change sensor every 15 days. [name], NP  Active   Insulin  Pen Needle (PEN NEEDLES) 32G X 4 MM MISC [id number]  Use as directed to inject insulin  once daily Nichols, Tonya S, NP  Active   LANTUS  SOLOSTAR 100 UNIT/ML Solostar Pen [id number]  Inject 20 Units into the skin daily. May increase up to 30 units daily as instructed by your provider. [name], NP  Active            Med Note DELSA, LORAIN  P   Mon Dec 21, 2023  2:46 PM) Taking 20 units daily  losartan  (COZAAR ) 50 MG tablet [id number]  Take 1 tablet (50 mg total) by mouth daily.  Patient taking differently: Take 50 mg by mouth daily. Taking 100 mg once daily   Paseda, Folashade R, FNP  Active   metFORMIN  (GLUCOPHAGE -XR) 500 MG 24 hr tablet [id number]  Take 1 tablet (500 mg total) by mouth 2 (two) times daily with a meal. Paseda, Folashade R, FNP  Active   metoprolol  succinate (TOPROL -XL) 100 MG 24 hr tablet [id number]  Take 1 tablet (100 mg total) by mouth daily. Take with or immediately following a meal. Anner Alm ORN, MD  Active   Semaglutide , 1 MG/DOSE, (OZEMPIC , 1 MG/DOSE,) 4 MG/3ML SOPN  [id number]  Inject 1 mg into the skin once a week. Paseda, Folashade R, FNP  Active   tadalafil (CIALIS) 20 MG tablet [id number]  Take 20 mg by mouth daily as needed. [provider]  Active             Recommendation:   Continue Current Plan of Care  Follow Up Plan:   Telephone follow-up in 1 month  Rolin Kerns, LCSW [facility] Health  [name], [facility] Clinical Social Worker Direct Dial: [phone]  Fax: [phone] Website: [email or web address] 2:15 PM

## 2024-02-15 NOTE — Patient Instructions (Signed)
 Visit Information  Rodney Kane was given information about Medicaid Managed Care team care coordination services as a part of their [employer] Medicaid benefit. Rodney Kane verbally consentedto engagement with the [facility] Managed Care team.   If you are experiencing a medical emergency, please call 911 or report to your local emergency department or urgent care.   If you have a non-emergency medical problem during routine business hours, please contact your provider's office and ask to speak with a nurse.   For questions related to your [employer] [facility] health plan, please call: [phone]  OR visit the member homepage at: [email or web address]  If you would like to schedule transportation through your [employer] [facility] plan, please call the following number at least 2 days in advance of your appointment: [phone]  If you are experiencing a behavioral health crisis, call the [employer] [place]  Behavioral Health Crisis Line at [phone] 2141824775). The line is available 24 hours a day, seven days a week.  If you would like help to quit smoking, call 1-800-QUIT-NOW (312-716-7420) OR Espaol: 1-855-Djelo-Ya (8-144-664-6430) o para ms informacin haga clic aqu or Text READY to 799-599 to register via text   Rodney Kane - following are the goals we discussed in your visit today:    Goals Addressed             This Visit's Progress    LCSW VBCI Social Work Care Plan   On track    Problems:   Stress  CSW Clinical Goal(s):   Over the next 60 days the Patient will attend all scheduled medical appointments as evidenced by patient report and care team review of appointment completion in electronic MEDICAL RECORD NUMBER  demonstrate a reduction in symptoms related to Stress at applying for Disabilty .  Interventions:  Mental Health:  Evaluation of current treatment plan  related to Stress at applying for Disability Active listening / Reflection utilized Financial risk analyst / information provided Emotional Support Provided Solution-Focued Strategies employed: Pt found his SS card and will f/up with DSS to schedule f/up appt  Patient Goals/Self-Care Activities:  Increase coping skills and healthy habits  Plan:   Telephone follow up appointment with care management team member scheduled for:  2-4 weeks        Please see education materials related to topics discussed provided by MyChart link.  Patient verbalizes understanding of instructions and care plan provided today and agrees to view in MyChart. Active MyChart status and patient understanding of how to access instructions and care plan via MyChart confirmed with patient.     Licensed Clinical Social Worker will 08/28 at 3 PM  Rodney Kerns, LCSW [place]  [facility], [facility] Clinical Social Worker Direct Dial: [phone]  Fax: [phone] Website: [email or web address] 2:18 PM   Following is a copy of your plan of care:  There are no care plans that you recently modified to display for this patient.

## 2024-02-15 NOTE — Patient Outreach (Signed)
 BSW received VM from patient and from [place] with DAC. Per voicemails, patient will be getting a call back on Monday 02/15/2024 from [facility] supervisor to schedule an appt to apply for SSDI. Per Cornelius [facility]), she will reach out to BSW to confirm appt details and ensure patient understands date/time/location of appointment.

## 2024-02-16 ENCOUNTER — Other Ambulatory Visit: Payer: Self-pay

## 2024-02-16 NOTE — Patient Instructions (Signed)
 Visit Information  Rodney Kane was given information about Medicaid Managed Care team care coordination services as a part of their [employer] benefit.   If you would like to schedule transportation through your [employer] [facility] plan, please call the following number at least 2 days in advance of your appointment: [phone]  If you are experiencing a behavioral health crisis, call the [employer] [place]  Behavioral Health Crisis Line at [phone] The line is available 24 hours a day, seven days a week.   [name] - following are the goals we discussed in your visit today:    Goals Addressed             This Visit's Progress    VBCI RN Care Plan   On track    Problems:  Chronic Disease Management support and education needs related to DMII, HTN, and wound  Goal: Over the next 6 months the Patient will continue to work with RN Care Manager and/or Social Worker to address care management and care coordination needs related to DMII, HTN, and Wound as evidenced by adherence to care management team scheduled appointments     demonstrate Improved adherence to prescribed treatment plan for DMII, HTN, and Wound as evidenced by decrease in A1C, blood pressure below 140/90, wound healing take all medications exactly as prescribed and will call provider for medication related questions as evidenced by patient report of medication adherence     Interventions:   Diabetes Interventions: Assessed patient's understanding of A1c goal: <7% Reviewed medications with patient and discussed importance of medication adherence Reviewed scheduled/upcoming provider appointments including: Pharmacist 02/17/24 Advised patient, providing education and rationale, to check cbg fasting once daily and record, calling PCP office for findings outside established parameters Screening for signs and symptoms of depression related to chronic  disease state  Assessed social determinant of health barriers Last A1C =10.0% as per  chart review Educated patient on the effect that good blood sugar control can have on wound healing   Lab Results  Component Value Date   HGBA1C 10.0 (H) 12/31/2023    Evaluation of current treatment plan related to Wound on lower left leg, self-management and patient's adherence to plan as established by provider. Discussed plans with patient for ongoing care management follow up and provided patient with direct contact information for care management team Evaluation of current treatment plan related to Wound Care and patient's adherence to plan as established by provider Reviewed scheduled/upcoming provider appointments including next appt at Wound Center is 02/22/24  Hypertension Interventions: Last practice recorded BP readings:  BP Readings from Last 3 Encounters:  01/14/24 130/89  12/07/23 (!) 156/84  10/27/23 (!) 168/109   Most recent eGFR/CrCl:  Lab Results  Component Value Date   EGFR 75 12/31/2023    No components found for: CRCL  Evaluation of current treatment plan related to hypertension self management and patient's adherence to plan as established by provider Provided education to patient re: stroke prevention, s/s of heart attack and stroke Reviewed medications with patient and discussed importance of compliance Advised patient, providing education and rationale, to monitor blood pressure daily and record, calling PCP for findings outside established parameters Provided education on prescribed diet low sodium Discussed complications of poorly controlled blood pressure such as heart disease, stroke, circulatory complications, vision complications, kidney impairment, sexual dysfunction  Patient Self-Care Activities:  Attend all scheduled provider appointments Take BP once a day, at least 1-2 hours after taking BP meds, gave  instructions to apply BP cuff, sit quietly for about 5  minutes, both feet on floor, back supported, then take BP & pulse, log.    Plan:  Telephone follow up appointment with care management team member scheduled for:  03/15/24 at 1:30 PM.              Please see education materials related to Managing Your Hypertension provided by MyChart link.  Patient verbalizes understanding of instructions and care plan provided today and agrees to view in MyChart. Active MyChart status and patient understanding of how to access instructions and care plan via MyChart confirmed with patient.     Telephone follow up appointment with Managed Medicaid care management team member scheduled for: 03/15/24 at 1:30 PM  [name], RN MSN [place]  [facility] RN Care Manager Direct Dial: [phone]  Fax: [phone]   Following is a copy of your plan of care:  There are no care plans that you recently modified to display for this patient.   Managing Your Hypertension Hypertension, also called high blood pressure, is when the force of the blood pressing against the walls of the arteries is too strong. Arteries are blood vessels that carry blood from your heart throughout your body. Hypertension forces the heart to work harder to pump blood and may cause the arteries to become narrow or stiff. Understanding blood pressure readings A blood pressure reading includes a higher number over a lower number: The first, or top, number is called the systolic pressure. It is a measure of the pressure in your arteries as your heart beats. The second, or bottom number, is called the diastolic pressure. It is a measure of the pressure in your arteries as the heart relaxes. For most people, a normal blood pressure is below 120/80. Your personal target blood pressure may vary depending on your medical conditions, your age, and other factors. Blood pressure is classified into four stages. Based on your blood pressure reading, your health care provider  may use the following stages to determine what type of treatment you need, if any. Systolic pressure and diastolic pressure are measured in a unit called millimeters of mercury (mmHg). Normal Systolic pressure: below 120. Diastolic pressure: below 80. Elevated Systolic pressure: 120-129. Diastolic pressure: below 80. Hypertension stage 1 Systolic pressure: 130-139. Diastolic pressure: 80-89. Hypertension stage 2 Systolic pressure: 140 or above. Diastolic pressure: 90 or above. How can this condition affect me? Managing your hypertension is very important. Over time, hypertension can damage the arteries and decrease blood flow to parts of the body, including the brain, heart, and kidneys. Having untreated or uncontrolled hypertension can lead to: A heart attack. A stroke. A weakened blood vessel (aneurysm). Heart failure. Kidney damage. Eye damage. Memory and concentration problems. Vascular dementia. What actions can I take to manage this condition? Hypertension can be managed by making lifestyle changes and possibly by taking medicines. Your health care provider will help you make a plan to bring your blood pressure within a normal range. You may be referred for counseling on a healthy diet and physical activity. Nutrition  Eat a diet that is high in fiber and potassium, and low in salt (sodium), added sugar, and fat. An example eating plan is called the DASH diet. DASH stands for Dietary Approaches to Stop Hypertension. To eat this way: Eat plenty of fresh fruits and vegetables. Try to fill one-half of your plate at each meal with fruits and vegetables. Eat whole grains, such as whole-wheat  pasta, brown rice, or whole-grain bread. Fill about one-fourth of your plate with whole grains. Eat low-fat dairy products. Avoid fatty cuts of meat, processed or cured meats, and poultry with skin. Fill about one-fourth of your plate with lean proteins such as fish, chicken without skin, beans,  eggs, and tofu. Avoid pre-made and processed foods. These tend to be higher in sodium, added sugar, and fat. Reduce your daily sodium intake. Many people with hypertension should eat less than 1,500 mg of sodium a day. Lifestyle  Work with your health care provider to maintain a healthy body weight or to lose weight. Ask what an ideal weight is for you. Get at least 30 minutes of exercise that causes your heart to beat faster (aerobic exercise) most days of the week. Activities may include walking, swimming, or biking. Include exercise to strengthen your muscles (resistance exercise), such as weight lifting, as part of your weekly exercise routine. Try to do these types of exercises for 30 minutes at least 3 days a week. Do not use any products that contain nicotine or tobacco. These products include cigarettes, chewing tobacco, and vaping devices, such as e-cigarettes. If you need help quitting, ask your health care provider. Control any long-term (chronic) conditions you have, such as high cholesterol or diabetes. Identify your sources of stress and find ways to manage stress. This may include meditation, deep breathing, or making time for fun activities. Alcohol use Do not drink alcohol if: Your health care provider tells you not to drink. You are pregnant, may be pregnant, or are planning to become pregnant. If you drink alcohol: Limit how much you have to: 0-1 drink a day for women. 0-2 drinks a day for men. Know how much alcohol is in your drink. In the [place] one drink equals one 12 oz bottle of beer (355 mL), one 5 oz glass of wine (148 mL), or one 1 oz glass of hard liquor (44 mL). Medicines Your health care provider may prescribe medicine if lifestyle changes are not enough to get your blood pressure under control and if: Your systolic blood pressure is 130 or higher. Your diastolic blood pressure is 80 or higher. Take medicines only as told by your health care provider. Follow the  directions carefully. Blood pressure medicines must be taken as told by your health care provider. The medicine does not work as well when you skip doses. Skipping doses also puts you at risk for problems. Monitoring Before you monitor your blood pressure: Do not smoke, drink caffeinated beverages, or exercise within 30 minutes before taking a measurement. Use the bathroom and empty your bladder (urinate). Sit quietly for at least 5 minutes before taking measurements. Monitor your blood pressure at home as told by your health care provider. To do this: Sit with your back straight and supported. Place your feet flat on the floor. Do not cross your legs. Support your arm on a flat surface, such as a table. Make sure your upper arm is at heart level. Each time you measure, take two or three readings one minute apart and record the results. You may also need to have your blood pressure checked regularly by your health care provider. General information Talk with your health care provider about your diet, exercise habits, and other lifestyle factors that may be contributing to hypertension. Review all the medicines you take with your health care provider because there may be side effects or interactions. Keep all follow-up visits. Your health care provider can help  you create and adjust your plan for managing your high blood pressure. Where to find more information National Heart, Lung, and Blood Institute: PopSteam.is American Heart Association: www.heart.org Contact a health care provider if: You think you are having a reaction to medicines you have taken. You have repeated (recurrent) headaches. You feel dizzy. You have swelling in your ankles. You have trouble with your vision. Get help right away if: You develop a severe headache or confusion. You have unusual weakness or numbness, or you feel faint. You have severe pain in your chest or abdomen. You vomit repeatedly. You have  trouble breathing. These symptoms may be an emergency. Get help right away. Call 911. Do not wait to see if the symptoms will go away. Do not drive yourself to the hospital. Summary Hypertension is when the force of blood pumping through your arteries is too strong. If this condition is not controlled, it may put you at risk for serious complications. Your personal target blood pressure may vary depending on your medical conditions, your age, and other factors. For most people, a normal blood pressure is less than 120/80. Hypertension is managed by lifestyle changes, medicines, or both. Lifestyle changes to help manage hypertension include losing weight, eating a healthy, low-sodium diet, exercising more, stopping smoking, and limiting alcohol. This information is not intended to replace advice given to you by your health care provider. Make sure you discuss any questions you have with your health care provider. Document Revised: 03/21/2021 Document Reviewed: 03/21/2021 Elsevier Patient Education  2024 [employer].

## 2024-02-16 NOTE — Patient Outreach (Signed)
 Complex Care Management   Visit Note  02/16/2024  Name:  Rodney Kane MRN: [id number] DOB: Nov 14, 1975  Situation: Referral received for Complex Care Management related to Heart Failure, Diabetes with Complications, and HTN I obtained verbal consent from Patient.  Visit completed with Rodney Kane  on the phone  Background:   Past Medical History:  Diagnosis Date   Coronavirus infection 06/2020   COVID 06/2020   Diabetes mellitus without complication (HCC)    Hemoglobin A1C greater than 9%, indicating poor diabetic control 12/2019   Hyperglycemia 12/2019   Hyperlipidemia 12/2019   Hypertension    Hypertensive urgency 12/2019   Noncompliance with medication regimen 12/2019   Nonischemic (hypertensive) dilated cardiomyopathy (HCC) 12/2016   EF has been 30 to 35%, recently lower.   Stroke [facility])    Urine ketones 12/2019   Vitamin D  deficiency 12/2019    Assessment: Patient Reported Symptoms:  Cognitive Cognitive Status: Able to follow simple commands, Alert and oriented to person, place, and time, Other: (Difficulty speaking/stuttering) Cognitive/Intellectual Conditions Management [RPT]: None reported or documented in medical history or problem list      Neurological Neurological Review of Symptoms: No symptoms reported    HEENT HEENT Symptoms Reported: No symptoms reported      Cardiovascular Cardiovascular Symptoms Reported: Palpitations (Occasional palpitations per patient. He has not noticed certain triggers to palpitations.) Does patient have uncontrolled Hypertension?: Yes Is patient checking Blood Pressure at home?: Yes (Confirmed that patient does have a cuff at home) Patient's Recent BP reading at home: Gets checked at wound care center weekly, yesterday was 147/94 per chart review. Patient reports it has been elevated at wound care center but sometimes this is before he has taken his medications. Per chart review, readings have been 140-160/90-115 at wound  care clinc. BP at most recent PCP visit was 130/89. Cardiovascular Management Strategies: Medication therapy Cardiovascular Comment: Advised patient to monitor BP at home and notify PCP or cardiology for any new or worsening symptoms. Discussed the importance of medication compliance, especially given history of stroke.  Respiratory Respiratory Symptoms Reported: No symptoms reported    Endocrine Endocrine Symptoms Reported: No symptoms reported Is patient diabetic?: Yes Is patient checking blood sugars at home?: Yes List most recent blood sugar readings, include date and time of day: CGM (FreeStyle [place]). Patient reports his readings have stayed in the green Endocrine Comment: Discussed how good diabetic control can effect wound healing.  Gastrointestinal Gastrointestinal Symptoms Reported: No symptoms reported Additional Gastrointestinal Details: Patient reports his appetite is up and down. Denies recent unintentional weight loss. Last BM yesterday.      Genitourinary Genitourinary Symptoms Reported: No symptoms reported    Integumentary Integumentary Symptoms Reported: Wound Additional Integumentary Details: Wound LLE, closing per patient. Patient reports he has had wound since September or October of last year. Seeing wound care clinic weekly for dressing changes. Skin Management Strategies: Dressing changes, Routine screening  Musculoskeletal Musculoskelatal Symptoms Reviewed: No symptoms reported Musculoskeletal Comment: Patient reports regular exercise by walking Falls in the past year?: No Number of falls in past year: 1 or less Was there an injury with Fall?: No Fall Risk Category Calculator: 0 Patient Fall Risk Level: Low Fall Risk Patient at Risk for Falls Due to: No Fall Risks Fall risk Follow up: Falls evaluation completed  Psychosocial Psychosocial Symptoms Reported: No symptoms reported            01/15/2024    1:48 PM  Depression screen PHQ 2/9  Decreased  Interest 1  Down, Depressed, Hopeless 0  PHQ - 2 Score 1    There were no vitals filed for this visit.  Medications Reviewed Today     Reviewed by Arno Rosaline SQUIBB, RN (Registered Nurse) on 02/16/24 at 1320  Med List Status: <None>   Medication Order Taking? Sig Documenting Provider Last Dose Status Informant  amLODipine  (NORVASC ) 10 MG tablet [id number] Yes Take 1 tablet (10 mg total) by mouth daily. Paseda, Folashade R, FNP  Active   apixaban  (ELIQUIS ) 5 MG TABS tablet [id number] Yes Take 1 tablet (5 mg total) by mouth 2 (two) times daily. [name], NP  Active   atorvastatin  (LIPITOR ) 40 MG tablet [id number] Yes Take 1 tablet (40 mg total) by mouth daily. [name], NP  Active   Blood Glucose Monitoring Suppl DEVI [id number] Yes 1 each by Does not apply route in the morning, at noon, and at bedtime. May substitute to any manufacturer covered by patient's insurance. [name], NP  Active            Med Note DELSA LORAIN SQUIBB Pablo Dec 21, 2023  2:46 PM)    Continuous Glucose Sensor (FREESTYLE LIBRE 3 PLUS SENSOR) [place] [id number] Yes Change sensor every 15 days. [name], NP  Active   Insulin  Pen Needle (PEN NEEDLES) 32G X 4 MM MISC [id number] Yes Use as directed to inject insulin  once daily Nichols, Tonya S, NP  Active   LANTUS  SOLOSTAR 100 UNIT/ML Solostar Pen [id number] Yes Inject 20 Units into the skin daily. May increase up to 30 units daily as instructed by your provider. [name], NP  Active            Med Note DELSA LORAIN SQUIBB Pablo Dec 21, 2023  2:46 PM) Taking 20 units daily  losartan  (COZAAR ) 50 MG tablet [id number] Yes Take 1 tablet (50 mg total) by mouth daily.  Patient taking differently: Take 50 mg by mouth daily. Taking 100 mg once daily   Paseda, Folashade R, FNP  Active   metFORMIN  (GLUCOPHAGE -XR) 500 MG 24 hr tablet [id number] Yes Take 1 tablet (500 mg total) by mouth 2 (two) times daily with a meal. Paseda, Folashade R, FNP  Active   metoprolol   succinate (TOPROL -XL) 100 MG 24 hr tablet [id number] Yes Take 1 tablet (100 mg total) by mouth daily. Take with or immediately following a meal. Anner Alm ORN, MD  Active   Semaglutide , 1 MG/DOSE, (OZEMPIC , 1 MG/DOSE,) 4 MG/3ML SOPN [id number] Yes Inject 1 mg into the skin once a week. Paseda, Folashade R, FNP  Active   tadalafil (CIALIS) 20 MG tablet [id number] Yes Take 20 mg by mouth daily as needed. [provider]  Active             Recommendation:   Specialty provider follow-up wound care clinic Continue Current Plan of Care Please monitor your blood pressure at home 1-2 hours after taking your medications  Follow Up Plan:   Telephone follow up appointment date/time:  03/15/24 at 1:30 PM  Rosaline Arno, RN MSN [place]  [facility] RN Care Manager Direct Dial: [phone]  Fax: [phone]

## 2024-02-17 ENCOUNTER — Telehealth: Payer: Self-pay

## 2024-02-17 ENCOUNTER — Other Ambulatory Visit

## 2024-02-17 NOTE — Progress Notes (Deleted)
 02/17/2024 Name: Rodney Kane MRN: [id number] DOB: 13-Jan-1976  No chief complaint on file.   Rodney Kane is a 48 y.o. year old male who was referred for medication management by their primary care provider, Rodney Bascom RAMAN, NP. They presented for telephone appointment today.   They were referred to the pharmacist by their PCP for assistance in managing diabetes. PMH includes HF (EF 45-50% in Aug 2023, LV global hypokinesis, G1DD), atrial fibrillation, HTN, CVA (2021), hx of VTE (2022), T2DM. Patient is following with complex care management for transportation needs, food insecurity, financial difficulties related to housing, and applying for disability. 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   Subjective:  Patient was initially outreached on 08/25/23 by Dorcas Solian, PharmD after patient had recently been seen in the ED 2/2 hyperglycemia and cellulitis. At in-person pharmacy appointment with me on 09/08/23, patient brought all his medication bottles and glucometer. Random BG were ranging 200-300s, with symptomatic hyperglycemia. Initiated insulin  glargine (Lantus ) 16 units daily and increased losartan  to 50 mg daily for elevated BP in the setting of HFrEF. Also restarted atorvastatin . At pharmacy f/u on 10/27/23, we initiated Ozempic , continued Lantus  and metformin , and stopped glipizide . Also recommended to start amlodipine  5 mg daily for elevated BP. Patient was seen by cardiology on 12/06/23. BP was still elevated and he was instructed to increase losartan  and metoprolol . He has also been reinitiated on a DOAC for stroke prevention given Afib dx. At pharmacy telephone appt on 12/08/23, patient reported post-prandial BG consistently > 200 mg/dL. He was having some GI upset with Ozempic , so we titrated Lantus  from 16 to 20 units daily.   At pharmacy telephone appt on 12/21/23 - patient reported having one episode of feeling dizziness, feeling clammy, nausea, blurry vision  when he was fishing and out in the sun for a while. He treated this with water and gatorade but reported that he continued to feel weak for a while. However, reported SMBG were all in the 200s. He was instructed to continue his current regimen, and we set up an appointment to initiated FL3+ CGM. His GI AE had improved after taking Ozempic  0.5 mg for a longer period of time. At last pharmacy appt on 01/04/24, patient successfully placed FL3+ CGM during telephone call. He was instructed to decrease losartan  for elevated potassium, and increase amlodipine  for elevated BP. He was also instructed to increase Ozempic  to 1 mg weekly. He was seen by his PCP on 01/14/24 and BP was 130/89 mmHg, HR 112 bpm.   Today patient reports ***  F/u wearing FL3+ Would like to add SGLT2i at some point for HFrEF - could do combo with metformin  (also has room to incr metformin ) > 1.20, eGFR 75. Xigduo 11/998 mg BID vs incr Ozempic  May be good to incr metop to 150 daily (1.5 tabs), then eventually target dose 200 mg daily  Care Team: Primary Care Provider: [name], NP ; Next Scheduled Visit: 04/15/24 Cardiology: Dr. Anner - no follow-up scheduled  Medication Access/Adherence  Current Pharmacy:  [facility] [address] [place] - [address] [place] [address] Phone: [phone] Fax: [phone]   Patient reports affordability concerns with their medications: Yes  Patient reports access/transportation concerns to their pharmacy: No  Patient reports adherence concerns with their medications:  No  - denies missed doses. Appears that metformin  XR has not been filled since March, but patient thinks he is taking.  Patient has been connected with Medicare Complex Care Management and SW for assistance applying for disability given residual deficits with speech.   Diabetes:  Current medications: metformin  500 XR BID (fill hx suggests he may not have a current  supply), Lantus  (insulin  glargine) 20 units daily (~1PM), Ozempic  1 mg once weekly Medications tried in the past: Novolog , sitagliptin , and linagliptin  in the past  Current glucose readings:  Using Freestyle Libre 3+  Date of Download: 02/02/24 to 02/15/24 % Time CGM is active: 39% Average Glucose: 190 mg/dL Glucose Management Indicator: 7.9  Glucose Variability: 18.8 (goal <36%) Time in Goal:  - Time in range 70-180: 42% - Time above range: 58% - Time below range: 0% Observed patterns:      Patient denies s/sx of hypoglycemia. Denies polydipsia, polyphagia, nocturia, blurred vision.   Current meal patterns: Typically 1-2 meals/day around12-1PM and then in the evening 6-8PM. Reports that some days he does not eat any meals, but instead snacks throughout the day (like multiple granola bars). Trying to reduce drinking regular sodas, and trying to drink more water and diet/sugar free options.  - Reports that it is difficult for him to cut out sweets. He will go a couple days without eating sweets, but then will eat a lot at one time.  - Lunch: not consistent, varies - Supper: varies - Drinks water all throughout the day, has switched to diet/sugar-free sodas, koolaid punch zero sugar, minutemaid zero sugar drinks  Current physical activity: occasional walking, has a shake plate at home (core exercise). No other strength training reported.   Current medication access support: insurance - Medicaid  Hypertension:  Current medications: losartan  50 mg PO daily, metoprolol  100 mg daily, amlodipine  10 mg daily Medications previously tried: furosemide  40 mg daily PRN (he was instructed to stop this at pharmacy telephone appt on 08/25/23 as it was an old Rx), hydralazine   Patient has a validated, automated, upper arm home BP cuff.  Current blood pressure readings readings: BP at most recent wound visit on 02/15/24 was 147/94 and on 02/09/24 was 157/105.   Objective:  BP Readings from Last 3  Encounters:  01/14/24 130/89  12/07/23 (!) 156/84  10/27/23 (!) 168/109   Lab Results  Component Value Date   HGBA1C 10.0 (H) 12/31/2023   HGBA1C 15.0 (A) 08/21/2023   HGBA1C >15.5 (H) 02/18/2022   eGFR > 60 mL/min, UACR 10/01/23: 4 mg/g  Lab Results  Component Value Date   CREATININE 1.20 12/31/2023   BUN 12 12/31/2023   NA 145 (H) 12/31/2023   K 5.3 (H) 12/31/2023   CL 101 12/31/2023   CO2 24 12/31/2023    Lab Results  Component Value Date   CHOL 146 12/31/2023   HDL 51 12/31/2023   LDLCALC 82 12/31/2023   TRIG 64 12/31/2023   CHOLHDL 2.9 12/31/2023    Medications Reviewed Today   Medications were not reviewed in this encounter     Assessment/Plan:   Diabetes: - Currently uncontrolled with A1c 10% above goal < 7%, but improved from 15% in January 2025. S/sx of hyperglycemia have resolved, suggesting that addition of basal insulin  and GLP-1RA has resulted in improved glycemic control. Continues to be a great candidate for GLP-1RA therapy with ASCVD history and BMI > 40. Since he is tolerating Ozempic  0.5 mg weekly well, will increase to 1 mg weekly. Assisted patient in applying and initiating CGM over the phone today. Will provide refill of metformin  XR - kidney function is stable. Will  plan to maximize metformin  dose at follow-up pending tolerability.  - Reviewed long term cardiovascular and renal outcomes of uncontrolled blood sugar - Reviewed goal A1c, goal fasting, and goal 2 hour post prandial glucose - Reviewed dietary modifications including increasing protein intake, avoiding carbohydrate heavy foods. Maintaining water intake and avoiding sugar-sweetened beverages. Educated extensively on lifestyle interventions to avoid GI upset with GLP-1RA.  - Reviewed lifestyle modifications including: increasing weight bearing exercise to avoid muscle loss with GLP-1RA.  - Recommend to increase Ozempic  (semaglutide ) 1 mg injected into the skin once weekly at next fill. Will  collaborate with prescriber to place orders.  - Recommend to continue insulin  glargine (Lantus ) 20 units injected into the skin daily. - Recommend to continue metformin  XR 500 mg PO BID  - Patient denies personal or family history of multiple endocrine neoplasia type 2, medullary thyroid  cancer; personal history of pancreatitis or gallbladder disease. - Recommend to monitor BG continuously with FL3+ CGM. Provided education on set-up and use over the phone today. Connected patient to [employer].  - Next A1C due Sept 2025   Hypertension/HFrEF: - Currently uncontrolled above goal < 130/80 mmHg.  Noted elevated BP of 162/95 at wound care appt on 12/28/23. Patient had repeat BMP in clinic this week - K remained mildly elevated at 5.3 mEq/L after increasing losartan  from 50 to 100 mg daily. Kidney function was stable and at baseline. Would recommend decreasing losartan  to 50 mg daily and maximizing amlodipine  for uncontrolled BP. Patient would be a good candidate to switch from losartan  to Entresto for HFrEF with close lab monitoring. Once A1C is better controlled, will consider SGLT2i. Will plan to repeat BMP at PCP follow-up in ~2 weeks.  - Reviewed long term cardiovascular and renal outcomes of uncontrolled blood pressure - Reviewed appropriate blood pressure monitoring technique and reviewed goal blood pressure. Recommended to check home blood pressure and heart rate a couple times per week.  - Recommend to decrease losartan  to 50 mg daily for elevated potassium - Recommend to increase amlodipine  to 10 mg daily  - Recommend to continue metoprolol  100 mg daily - Repeat BMP on 01/14/24 at PCP appt to monitor K and Scr   Hyperlipidemia/ASCVD Risk Reduction: - Currently uncontrolled with last LDL-C of 82 mg/dL above goal < 55 mg/dL given premature ASCVD, however - he has only been taking high-intensity statin since 12/10/23. He would likely be a good candidate for Repatha in the future, but did not discuss  this today.  - Reviewed long term complications of uncontrolled cholesterol - Recommend to continue atorvastatin  40 mg PO daily    Follow Up Plan: PCP 04/15/24, Pharmacist ***   Lorain Baseman, PharmD [facility] [phone]

## 2024-02-17 NOTE — Telephone Encounter (Signed)
 Attempted to contact patient for scheduled appointment for medication management. Left HIPAA compliant message for patient to return my call at their convenience.   Lorain Baseman, PharmD [facility] [phone]

## 2024-02-22 ENCOUNTER — Other Ambulatory Visit: Payer: Self-pay

## 2024-02-22 DIAGNOSIS — Z8673 Personal history of transient ischemic attack (TIA), and cerebral infarction without residual deficits: Secondary | ICD-10-CM | POA: Diagnosis not present

## 2024-02-22 DIAGNOSIS — I872 Venous insufficiency (chronic) (peripheral): Secondary | ICD-10-CM | POA: Diagnosis not present

## 2024-02-22 DIAGNOSIS — L97822 Non-pressure chronic ulcer of other part of left lower leg with fat layer exposed: Secondary | ICD-10-CM | POA: Diagnosis not present

## 2024-02-22 NOTE — Patient Outreach (Signed)
 Complex Care Management   Visit Note  02/22/2024  Name:  Rodney Kane MRN: [id number] DOB: 04/28/1976  Situation: Referral received for Complex Care Management related to SDOH Barriers:  Food insecurity Apply for SSDI I obtained verbal consent from Patient.  Visit completed with patient  on the phone  Background:   Past Medical History:  Diagnosis Date   Coronavirus infection 06/2020   COVID 06/2020   Diabetes mellitus without complication (HCC)    Hemoglobin A1C greater than 9%, indicating poor diabetic control 12/2019   Hyperglycemia 12/2019   Hyperlipidemia 12/2019   Hypertension    Hypertensive urgency 12/2019   Noncompliance with medication regimen 12/2019   Nonischemic (hypertensive) dilated cardiomyopathy (HCC) 12/2016   EF has been 30 to 35%, recently lower.   Stroke [facility])    Urine ketones 12/2019   Vitamin D  deficiency 12/2019    Assessment: BSW held f/u call with patient. Patient was alert and cognitive. Patient reports he has not received a call back from Latoria with DAC to schedule an appointment to apply. BSW and patient have been having a difficult time getting patient application assistance with SSDI. Patient wishes to explore other options. BSW will reach out to the [facility] to request information on how to refer patient for help. Patient understood and agreed. Patient confirmed no other needs at this time.    SDOH Interventions    Flowsheet Row Patient Outreach Telephone from 01/29/2024 in [place] POPULATION HEALTH DEPARTMENT Most recent reading at 01/29/2024 11:22 AM Patient Outreach Telephone from 01/15/2024 in [place] POPULATION HEALTH DEPARTMENT Most recent reading at 01/15/2024  1:30 PM Patient Outreach from 01/15/2024 in [place] POPULATION HEALTH DEPARTMENT Most recent reading at 01/15/2024 10:18 AM Patient Outreach Telephone from 01/01/2024 in [place] POPULATION HEALTH DEPARTMENT Most recent reading at 01/01/2024 10:33 AM Patient  Outreach from 12/17/2023 in [place] POPULATION HEALTH DEPARTMENT Most recent reading at 12/17/2023  1:22 PM Patient Outreach from 12/02/2023 in [place] POPULATION HEALTH DEPARTMENT Most recent reading at 12/02/2023 10:47 AM  SDOH Interventions        Food Insecurity Interventions Intervention Not Indicated  [Patient has food resources sent to him via email and mail and states he is fine with those at the moment.] Intervention Not Indicated Community Resources Provided [employer] Provided Intervention Not Indicated --  Housing Interventions Intervention Not Indicated Intervention Not Indicated Intervention Not Indicated Intervention Not Indicated Intervention Not Indicated --  Transportation Interventions Intervention Not Indicated  [patient has own personal transportation] Intervention Not Indicated Intervention Not Indicated Intervention Not Indicated Intervention Not Indicated Intervention Not Indicated  Utilities Interventions Intervention Not Indicated Intervention Not Indicated Intervention Not Indicated Intervention Not Indicated Intervention Not Indicated Intervention Not Indicated  Depression Interventions/Treatment  -- -- -- -- Patient refuses Treatment --  Financial Strain Interventions Intervention Not Indicated -- Intervention Not Indicated Intervention Not Indicated -- --    Recommendation:   Explore other options for SSDI application assistance.  Follow Up Plan:   Telephone follow up appointment date/time:  03/04/2024 at 11AM  [name], [place]/[facility]BCI - [facility] Social Worker [phone]

## 2024-02-22 NOTE — Patient Instructions (Signed)
 Visit Information  Rodney Kane was given information about Medicaid Managed Care team care coordination services as a part of their [employer] Medicaid benefit.   If you would like to schedule transportation through your [employer] [facility] plan, please call the following number at least 2 days in advance of your appointment: [phone]  If you are experiencing a behavioral health crisis, call the [employer] [place]  Behavioral Health Crisis Line at [phone]). The line is available 24 hours a day, seven days a week.   [name] - following are the goals we discussed in your visit today:    Goals Addressed             This Visit's Progress    BSW VBCI Social Work Care Plan   On track    Problems:   Food Insecurity  and applying to SSDI  CSW Clinical Goal(s):   Over the next 2 weeks the Patient will work with Child psychotherapist to address concerns related to food insecurity and applying to SSDI.  Interventions:  Pt will visit his local DSS office to apply for Food Stamps. SW will provide patient with local food pantries in his community to access while he secures a more sustainable resource (food stamps). SW will do outreach to two organizations in [place] that may be able to help Pt apply for SSDI.   Patient Goals/Self-Care Activities:  Pt will visit local DSS office to apply for Food Stamps.  Plan:   Telephone follow up appointment with care management team member scheduled for:  01/15/2024 at 10AM.         Patient verbalizes understanding of instructions and care plan provided today and agrees to view in MyChart. Active MyChart status and patient understanding of how to access instructions and care plan via MyChart confirmed with patient.     Telephone follow up appointment with Managed Medicaid care management team member scheduled for:03/04/2024 at 11AM  [name], [place]/VBCI - [facility] Social Worker [phone]   Following is a copy of your plan of care:  There are no care plans that you recently modified to display for this patient.

## 2024-02-29 DIAGNOSIS — I872 Venous insufficiency (chronic) (peripheral): Secondary | ICD-10-CM | POA: Diagnosis not present

## 2024-02-29 DIAGNOSIS — L97822 Non-pressure chronic ulcer of other part of left lower leg with fat layer exposed: Secondary | ICD-10-CM | POA: Diagnosis not present

## 2024-02-29 DIAGNOSIS — Z8673 Personal history of transient ischemic attack (TIA), and cerebral infarction without residual deficits: Secondary | ICD-10-CM | POA: Diagnosis not present

## 2024-03-04 ENCOUNTER — Other Ambulatory Visit: Payer: Self-pay

## 2024-03-04 NOTE — Patient Outreach (Signed)
 BSW coordinated referral for [facility] as instructed by them through [facility] ([email or web address]). Referral forms were shared with Rojelio to ensure accuracy completion and [facility] faxed forms to [facility]. BSW called patient and informed him of the completed referral.

## 2024-03-04 NOTE — Patient Instructions (Signed)
 Visit Information  Mr. Hantz was given information about Medicaid Managed Care team care coordination services as a part of their [employer] Medicaid benefit.   If you would like to schedule transportation through your [facility] [facility] plan, please call the following number at least 2 days in advance of your appointment: [phone]  If you are experiencing a behavioral health crisis, call the [employer] [place]  Behavioral Health Crisis Line at [phone]). The line is available 24 hours a day, seven days a week.   Mr. Mittag - following are the goals we discussed in your visit today:    Goals Addressed   None    Patient verbalizes understanding of instructions and care plan provided today and agrees to view in MyChart. Active MyChart status and patient understanding of how to access instructions and care plan via MyChart confirmed with patient.     Telephone follow up appointment with Managed Medicaid care management team member scheduled for: 03/18/2024 at 11:30AM  [name], [place]/VBCI - [facility] Social Worker [phone]   Following is a copy of your plan of care:  There are no care plans that you recently modified to display for this patient.

## 2024-03-04 NOTE — Patient Outreach (Signed)
 Complex Care Management   Visit Note  03/04/2024  Name:  Rodney Kane MRN: [id number] DOB: 10-29-1975  Situation: Referral received for Complex Care Management related to SSDI application assistance I obtained verbal consent from Patient.  Visit completed with patient  on the phone  Background:   Past Medical History:  Diagnosis Date   Coronavirus infection 06/2020   COVID 06/2020   Diabetes mellitus without complication (HCC)    Hemoglobin A1C greater than 9%, indicating poor diabetic control 12/2019   Hyperglycemia 12/2019   Hyperlipidemia 12/2019   Hypertension    Hypertensive urgency 12/2019   Noncompliance with medication regimen 12/2019   Nonischemic (hypertensive) dilated cardiomyopathy (HCC) 12/2016   EF has been 30 to 35%, recently lower.   Stroke [facility])    Urine ketones 12/2019   Vitamin D  deficiency 12/2019    Assessment: BSW held f/u appt with pt. Pt was alert and cognitive. Pt reports he has not heard back from [facility] and does not wish to move forward with SSDI application assistance with them. BSW and patient completed referral forms for [facility] who has a Ship broker. BSW was instructed by Karna Sermon that the referral must be coordinated through Rojelio Gander, Physiological scientist. BSW will reach out to coordinate referral.   SDOH Interventions    Flowsheet Row Patient Outreach Telephone from 01/29/2024 in [place] POPULATION HEALTH DEPARTMENT Most recent reading at 01/29/2024 11:22 AM Patient Outreach Telephone from 01/15/2024 in [place] POPULATION HEALTH DEPARTMENT Most recent reading at 01/15/2024  1:30 PM Patient Outreach from 01/15/2024 in [place] POPULATION HEALTH DEPARTMENT Most recent reading at 01/15/2024 10:18 AM Patient Outreach Telephone from 01/01/2024 in [place] POPULATION HEALTH DEPARTMENT Most recent reading at 01/01/2024 10:33 AM Patient Outreach from 12/17/2023 in [place] POPULATION HEALTH  DEPARTMENT Most recent reading at 12/17/2023  1:22 PM Patient Outreach from 12/02/2023 in [place] POPULATION HEALTH DEPARTMENT Most recent reading at 12/02/2023 10:47 AM  SDOH Interventions        Food Insecurity Interventions Intervention Not Indicated  [Patient has food resources sent to him via email and mail and states he is fine with those at the moment.] Intervention Not Indicated Community Resources Provided [employer] Provided Intervention Not Indicated --  Housing Interventions Intervention Not Indicated Intervention Not Indicated Intervention Not Indicated Intervention Not Indicated Intervention Not Indicated --  Transportation Interventions Intervention Not Indicated  [patient has own personal transportation] Intervention Not Indicated Intervention Not Indicated Intervention Not Indicated Intervention Not Indicated Intervention Not Indicated  Utilities Interventions Intervention Not Indicated Intervention Not Indicated Intervention Not Indicated Intervention Not Indicated Intervention Not Indicated Intervention Not Indicated  Depression Interventions/Treatment  -- -- -- -- Patient refuses Treatment --  Financial Strain Interventions Intervention Not Indicated -- Intervention Not Indicated Intervention Not Indicated -- --      Recommendation:   None   Follow Up Plan:   Telephone follow up appointment date/time:  03/18/2024 at 11:30AM  [name], BSW [place]/VBCI - [employer] Social Worker [phone]

## 2024-03-07 DIAGNOSIS — Z8673 Personal history of transient ischemic attack (TIA), and cerebral infarction without residual deficits: Secondary | ICD-10-CM | POA: Diagnosis not present

## 2024-03-07 DIAGNOSIS — I872 Venous insufficiency (chronic) (peripheral): Secondary | ICD-10-CM | POA: Diagnosis not present

## 2024-03-07 DIAGNOSIS — L97822 Non-pressure chronic ulcer of other part of left lower leg with fat layer exposed: Secondary | ICD-10-CM | POA: Diagnosis not present

## 2024-03-15 ENCOUNTER — Other Ambulatory Visit: Payer: Self-pay

## 2024-03-15 NOTE — Patient Instructions (Signed)
 Visit Information  [name] was given information about Medicaid Managed Care team care coordination services as a part of their [employer] Medicaid benefit.   If you would like to schedule transportation through your [facility] plan, please call the following number at least 2 days in advance of your appointment: [phone]  If you are experiencing a behavioral health crisis, call the [employer] [place]  Behavioral Health Crisis Line at [phone]). The line is available 24 hours a day, seven days a week.   [name] - following are the goals we discussed in your visit today:    Goals Addressed             This Visit's Progress    VBCI RN Care Plan   On track    Problems:  Chronic Disease Management support and education needs related to DMII, HTN, and wound  Goal: Over the next 30 days the Patient will continue to work with RN Care Manager and/or Social Worker to address care management and care coordination needs related to DMII, HTN, and Wound as evidenced by adherence to care management team scheduled appointments     demonstrate Improved adherence to prescribed treatment plan for DMII, HTN, and Wound as evidenced by decrease in A1C, blood pressure below 140/90, wound healing demonstrate Improved health management independence as evidenced by patient report of monitoring BP at home and keeping a log of readings, notifying provider for elevated readings and/or symptoms related to high blood pressure        take all medications exactly as prescribed and will call provider for medication related questions as evidenced by patient report of medication adherence     Interventions:   Diabetes Interventions: Assessed patient's understanding of A1c goal: <7% Reviewed medications with patient and discussed importance of medication adherence Reviewed scheduled/upcoming provider appointments including: PCP Advised  patient, providing education and rationale, to check cbg fasting once daily and record, calling PCP office for findings outside established parameters Last A1C =10.0% as per  chart review Educated patient importance of good blood sugar control and risks of uncontrolled diabetes  Lab Results  Component Value Date   HGBA1C 10.0 (H) 12/31/2023    Evaluation of current treatment plan related to Wound on lower left leg, self-management and patient's adherence to plan as established by provider. Discussed plans with patient for ongoing care management follow up and provided patient with direct contact information for care management team Evaluation of current treatment plan related to Wound Care and patient's adherence to plan as established by provider  Hypertension Interventions: Last practice recorded BP readings:  BP Readings from Last 3 Encounters:  01/14/24 130/89  12/07/23 (!) 156/84  10/27/23 (!) 168/109   Most recent eGFR/CrCl:  Lab Results  Component Value Date   EGFR 75 12/31/2023    No components found for: CRCL  Evaluation of current treatment plan related to hypertension self management and patient's adherence to plan as established by provider Provided education to patient re: stroke prevention, s/s of heart attack and stroke Reviewed medications with patient and discussed importance of compliance Advised patient, providing education and rationale, to monitor blood pressure daily and record, calling PCP for findings outside established parameters Discussed complications of poorly controlled blood pressure such as heart disease, stroke, circulatory complications, vision complications, kidney impairment, sexual dysfunction  Patient Self-Care Activities:  Attend all scheduled provider appointments Take BP once a day, at least 1-2 hours after taking BP meds, gave instructions to  apply BP cuff, sit quietly for about 5 minutes, both feet on floor, back supported, then take BP &  pulse, log.    Plan:  Telephone follow up appointment with care management team member scheduled for:  04/18/24 at 1 PM.               Patient verbalizes understanding of instructions and care plan provided today and agrees to view in MyChart. Active MyChart status and patient understanding of how to access instructions and care plan via MyChart confirmed with patient.     Telephone follow up appointment with Managed Medicaid care management team member scheduled for: 04/18/24 at 1 PM  [name], RN MSN [place]  VBCI Population Health RN Care Manager Direct Dial: [phone]  Fax: [phone]   Following is a copy of your plan of care:  There are no care plans that you recently modified to display for this patient.

## 2024-03-15 NOTE — Patient Outreach (Signed)
 Complex Care Management   Visit Note  03/15/2024  Name:  Rodney Kane MRN: [id number] DOB: 04-13-1976  Situation: Referral received for Complex Care Management related to Heart Failure, Diabetes with Complications, and HTN I obtained verbal consent from Patient.  Visit completed with Patient  on the phone  Background:   Past Medical History:  Diagnosis Date   Coronavirus infection 06/2020   COVID 06/2020   Diabetes mellitus without complication (HCC)    Hemoglobin A1C greater than 9%, indicating poor diabetic control 12/2019   Hyperglycemia 12/2019   Hyperlipidemia 12/2019   Hypertension    Hypertensive urgency 12/2019   Noncompliance with medication regimen 12/2019   Nonischemic (hypertensive) dilated cardiomyopathy (HCC) 12/2016   EF has been 30 to 35%, recently lower.   Stroke [facility])    Urine ketones 12/2019   Vitamin D  deficiency 12/2019    Assessment: Patient Reported Symptoms:  Cognitive Cognitive Status: Able to follow simple commands, Alert and oriented to person, place, and time, Other: (Difficulty speaking/stuttering) Cognitive/Intellectual Conditions Management [RPT]: Brain Injury Brain Injury: CVA (2021, 2022)      Neurological Neurological Review of Symptoms: Not assessed    HEENT HEENT Symptoms Reported: Not assessed      Cardiovascular Cardiovascular Symptoms Reported: Palpitations (Occasional palpitations continue, at baseline per patient) Does patient have uncontrolled Hypertension?: Yes Is patient checking Blood Pressure at home?: Yes Patient's Recent BP reading at home: Patient continues to get BP checked weekly at wound care clinic, reporting it is high. Patient notes he does not always take his medications prior to appointment at clinic. Advised patient to monitor at home and to keep a log of readings. Emphasized importance of getting accurate readings to understand BP control. Cardiovascular Management Strategies: Medication therapy   Respiratory Respiratory Symptoms Reported: Not assesed    Endocrine Endocrine Symptoms Reported: No symptoms reported Is patient diabetic?: Yes Is patient checking blood sugars at home?: Yes List most recent blood sugar readings, include date and time of day: CGM (FreeStyle Libre), 179 at time of assessment. Per 7-day review provided by patient, blood sugars ranging 170-205    Gastrointestinal Gastrointestinal Symptoms Reported: Not assessed      Genitourinary Genitourinary Symptoms Reported: Not assessed    Integumentary Integumentary Symptoms Reported: Wound Additional Integumentary Details: Patient reports wound LLE basically healed other than one spot that bleeds a little bit. He continues to follow with wound care clinic weekly for dressing changes. Skin Management Strategies: Dressing changes, Routine screening  Musculoskeletal Musculoskelatal Symptoms Reviewed: Not assessed        Psychosocial Psychosocial Symptoms Reported: Not assessed          03/15/2024    PHQ2-9 Depression Screening   Little interest or pleasure in doing things    Feeling down, depressed, or hopeless    PHQ-2 - Total Score    Trouble falling or staying asleep, or sleeping too much    Feeling tired or having little energy    Poor appetite or overeating     Feeling bad about yourself - or that you are a failure or have let yourself or your family down    Trouble concentrating on things, such as reading the newspaper or watching television    Moving or speaking so slowly that other people could have noticed.  Or the opposite - being so fidgety or restless that you have been moving around a lot more than usual    Thoughts that you would be better off dead, or hurting  yourself in some way    PHQ2-9 Total Score    If you checked off any problems, how difficult have these problems made it for you to do your work, take care of things at home, or get along with other people    Depression  Interventions/Treatment      There were no vitals filed for this visit.  Medications Reviewed Today     Reviewed by Arno Rosaline SQUIBB, RN (Registered Nurse) on 03/15/24 at 1332  Med List Status: <None>   Medication Order Taking? Sig Documenting Provider Last Dose Status Informant  amLODipine  (NORVASC ) 10 MG tablet [id number]  Take 1 tablet (10 mg total) by mouth daily. Paseda, Folashade R, FNP  Active   apixaban  (ELIQUIS ) 5 MG TABS tablet [id number]  Take 1 tablet (5 mg total) by mouth 2 (two) times daily. [name], NP  Active   atorvastatin  (LIPITOR ) 40 MG tablet [id number]  Take 1 tablet (40 mg total) by mouth daily. [name], NP  Active   Blood Glucose Monitoring Suppl DEVI [id number]  1 each by Does not apply route in the morning, at noon, and at bedtime. May substitute to any manufacturer covered by patient's insurance. [name], NP  Active            Med Note DELSA LORAIN SQUIBB Pablo Dec 21, 2023  2:46 PM)    Continuous Glucose Sensor (FREESTYLE LIBRE 3 PLUS SENSOR) [place] [id number]  Change sensor every 15 days. [name], NP  Active   Insulin  Pen Needle (PEN NEEDLES) 32G X 4 MM MISC [id number]  Use as directed to inject insulin  once daily Nichols, Tonya S, NP  Active   LANTUS  SOLOSTAR 100 UNIT/ML Solostar Pen [id number]  Inject 20 Units into the skin daily. May increase up to 30 units daily as instructed by your provider. [name], NP  Active            Med Note DELSA LORAIN SQUIBB Pablo Dec 21, 2023  2:46 PM) Taking 20 units daily  losartan  (COZAAR ) 50 MG tablet [id number]  Take 1 tablet (50 mg total) by mouth daily.  Patient taking differently: Take 50 mg by mouth daily. Taking 100 mg once daily   Paseda, Folashade R, FNP  Active   metFORMIN  (GLUCOPHAGE -XR) 500 MG 24 hr tablet [id number]  Take 1 tablet (500 mg total) by mouth 2 (two) times daily with a meal. Paseda, Folashade R, FNP  Active   metoprolol  succinate (TOPROL -XL) 100 MG 24 hr tablet [id number]  Take 1  tablet (100 mg total) by mouth daily. Take with or immediately following a meal. Anner Alm ORN, MD  Expired 03/06/24 2359   Semaglutide , 1 MG/DOSE, (OZEMPIC , 1 MG/DOSE,) 4 MG/3ML SOPN [id number]  Inject 1 mg into the skin once a week. Paseda, Folashade R, FNP  Active   tadalafil (CIALIS) 20 MG tablet [id number]  Take 20 mg by mouth daily as needed. [provider]  Active             Recommendation:   PCP Follow-up Continue Current Plan of Care  Follow Up Plan:   Telephone follow up appointment date/time:  04/18/24 at 1 PM  Rosaline Arno, RN MSN [place]  [facility] RN Care Manager Direct Dial: [phone]  Fax: [phone]

## 2024-03-17 ENCOUNTER — Other Ambulatory Visit: Payer: Self-pay | Admitting: Licensed Clinical Social Worker

## 2024-03-18 ENCOUNTER — Other Ambulatory Visit: Payer: Self-pay

## 2024-03-18 NOTE — Patient Outreach (Signed)
 Complex Care Management   Visit Note  03/18/2024  Name:  Rodney Kane MRN: [id number] DOB: Sep 23, 1975  Situation: Referral received for Complex Care Management related to SSDI application assistance I obtained verbal consent from Patient.  Visit completed with Patient  on the phone  Background:   Past Medical History:  Diagnosis Date   Coronavirus infection 06/2020   COVID 06/2020   Diabetes mellitus without complication (HCC)    Hemoglobin A1C greater than 9%, indicating poor diabetic control 12/2019   Hyperglycemia 12/2019   Hyperlipidemia 12/2019   Hypertension    Hypertensive urgency 12/2019   Noncompliance with medication regimen 12/2019   Nonischemic (hypertensive) dilated cardiomyopathy (HCC) 12/2016   EF has been 30 to 35%, recently lower.   Stroke [facility])    Urine ketones 12/2019   Vitamin D  deficiency 12/2019    Assessment: BSW held f/u with pt. Pt was alert and cognitive. BSW revisted SDOH needs and no needs were identified at this time. Pt confirmed as well. BSW and Pt called [facility] [phone]) to f/u on referral paperwork sent on 08/18. Paperwork was received and next step is to schedule an interview with the center. Pt was given appt for initial interview with Rodney Kane [phone]. [phone]) on 03/24/2024 at 11AM over the phone. No additional resources were provided/requested at this time.   SDOH Interventions    Flowsheet Row Patient Outreach Telephone from 03/18/2024 in [place] POPULATION HEALTH DEPARTMENT Most recent reading at 03/18/2024 11:41 AM Patient Outreach Telephone from 01/29/2024 in [place] POPULATION HEALTH DEPARTMENT Most recent reading at 01/29/2024 11:22 AM Patient Outreach Telephone from 01/15/2024 in [place] POPULATION HEALTH DEPARTMENT Most recent reading at 01/15/2024  1:30 PM Patient Outreach from 01/15/2024 in [place] POPULATION HEALTH DEPARTMENT Most recent reading at 01/15/2024 10:18 AM Patient Outreach  Telephone from 01/01/2024 in [place] POPULATION HEALTH DEPARTMENT Most recent reading at 01/01/2024 10:33 AM Patient Outreach from 12/17/2023 in [place] POPULATION HEALTH DEPARTMENT Most recent reading at 12/17/2023  1:22 PM  SDOH Interventions        Food Insecurity Interventions Intervention Not Indicated Intervention Not Indicated  [Patient has food resources sent to him via email and mail and states he is fine with those at the moment.] Intervention Not Indicated Community Resources Provided [employer] Provided Intervention Not Indicated  Housing Interventions Intervention Not Indicated Intervention Not Indicated Intervention Not Indicated Intervention Not Indicated Intervention Not Indicated Intervention Not Indicated  Transportation Interventions Intervention Not Indicated Intervention Not Indicated  [patient has own personal transportation] Intervention Not Indicated Intervention Not Indicated Intervention Not Indicated Intervention Not Indicated  Utilities Interventions Intervention Not Indicated Intervention Not Indicated Intervention Not Indicated Intervention Not Indicated Intervention Not Indicated Intervention Not Indicated  Depression Interventions/Treatment  -- -- -- -- -- Patient refuses Treatment  Financial Strain Interventions Intervention Not Indicated Intervention Not Indicated -- Intervention Not Indicated Intervention Not Indicated --      Recommendation:   Attend [facility] Appt for initial interview for SSDI application assistance on 03/24/2024 at 11AM.   Follow Up Plan:   Telephone follow up appointment date/time:  03/30/2024 at 11:15AM  Rodney Kane, BSW [place]/VBCI - [employer] Social Worker [phone]

## 2024-03-18 NOTE — Patient Instructions (Signed)
 Visit Information  Rodney Kane was given information about Medicaid Managed Care team care coordination services as a part of their [employer] Medicaid benefit.   If you would like to schedule transportation through your [facility] plan, please call the following number at least 2 days in advance of your appointment: [phone]  If you are experiencing a behavioral health crisis, call the [employer] [place]  Behavioral Health Crisis Line at [phone]). The line is available 24 hours a day, seven days a week.   Rodney Kane - following are the goals we discussed in your visit today:    Goals Addressed   None     Patient verbalizes understanding of instructions and care plan provided today and agrees to view in MyChart. Active MyChart status and patient understanding of how to access instructions and care plan via MyChart confirmed with patient.     Telephone follow up appointment with Managed Medicaid care management team member scheduled for: 03/30/2024 at 11:15AM  [name], BSW [place]/VBCI - [facility] Social Worker [phone]   Following is a copy of your plan of care:  There are no care plans that you recently modified to display for this patient.

## 2024-03-25 NOTE — Patient Instructions (Signed)
 Visit Information  Rodney Kane was given information about Medicaid Managed Care team care coordination services as a part of their [employer] Medicaid benefit.   If you would like to schedule transportation through your [facility] plan, please call the following number at least 2 days in advance of your appointment: [phone]  If you are experiencing a behavioral health crisis, call the [employer] [place]  Behavioral Health Crisis Line at [phone]). The line is available 24 hours a day, seven days a week.   Rodney Kane - following are the goals we discussed in your visit today:    Goals Addressed             This Visit's Progress    LCSW VBCI Social Work Care Plan   On track    Problems:   Stress  CSW Clinical Goal(s):   Over the next 60 days the Patient will attend all scheduled medical appointments as evidenced by patient report and care team review of appointment completion in electronic MEDICAL RECORD NUMBER  demonstrate a reduction in symptoms related to Stress at applying for Disabilty .  Interventions:  Mental Health:  Evaluation of current treatment plan related to Stress at applying for Disability Active listening / Reflection utilized Financial risk analyst / information provided Emotional Support Provided Solution-Focued Strategies employed: Pt found his SS card and will f/up with DSS to schedule f/up appt  Patient Goals/Self-Care Activities:  Increase coping skills and healthy habits  Follow up with [facility] to assist with disability application  Plan:   Telephone follow up appointment with care management team member scheduled for:  2-4 weeks        Please see education materials related to topics discussed provided by MyChart link.  Patient verbalizes understanding of instructions and care plan provided today and agrees to view in MyChart. Active MyChart status and patient  understanding of how to access instructions and care plan via MyChart confirmed with patient.     Licensed Clinical Social Worker will call on 10/02 at 2 PM  Rolin Kerns, LCSW [place]  [facility], [facility] Clinical Social Worker Direct Dial: [phone]  Fax: [phone] Website: [email or web address] 6:59 PM   Following is a copy of your plan of care:  There are no care plans that you recently modified to display for this patient.

## 2024-03-30 ENCOUNTER — Other Ambulatory Visit: Payer: Self-pay

## 2024-03-30 NOTE — Patient Outreach (Signed)
 BSW attempted contact for f/u appt during scheduled appt window time twice without success. BSW left VM requesting call back to reschedule. BSW also informed pt via VM that he will try outreach efforts again on 04/04/2024 at 11:30AM.

## 2024-04-04 ENCOUNTER — Other Ambulatory Visit: Payer: Self-pay

## 2024-04-04 NOTE — Patient Outreach (Signed)
 Complex Care Management   Visit Note  04/04/2024  Name:  Rodney Kane MRN: [id number] DOB: 1976/06/14  Situation: Referral received for Complex Care Management related to SSDI application assistance I obtained verbal consent from Patient.  Visit completed with Patient  on the phone  Background:   Past Medical History:  Diagnosis Date   Coronavirus infection 06/2020   COVID 06/2020   Diabetes mellitus without complication (HCC)    Hemoglobin A1C greater than 9%, indicating poor diabetic control 12/2019   Hyperglycemia 12/2019   Hyperlipidemia 12/2019   Hypertension    Hypertensive urgency 12/2019   Noncompliance with medication regimen 12/2019   Nonischemic (hypertensive) dilated cardiomyopathy (HCC) 12/2016   EF has been 30 to 35%, recently lower.   Stroke [facility])    Urine ketones 12/2019   Vitamin D  deficiency 12/2019    Assessment: BSW held f/u appt with pt. Pt was alert and cognitive. However, pt also reports being sick for a couple weeks now with a cold. BSW encouraged pt to schedule PCP appt if he felt like he needed to or consider getting a flu shot as they become available this season. Pt understood. Pt confirmed that he met with Lori Sambol [phone]. [phone] on 09/04. Pt states she sent him some paperwork via mail that needed to be completed and signed. Pt confirmed he sent back paperwork on Monday or Friday of last weel. BSW encouraged pt to f/u with Katheryn to ensure she receives his paperwork. Pt agreed to giver her a call later this week. No additional resources were provided/requested at this time. Pt and BSW will be moving to a monthly f/u after next f/u appt.   SDOH Interventions    Flowsheet Row Patient Outreach Telephone from 04/04/2024 in [place] POPULATION HEALTH DEPARTMENT Most recent reading at 04/04/2024 11:36 AM Patient Outreach Telephone from 03/18/2024 in [place] POPULATION HEALTH DEPARTMENT Most recent reading at 03/18/2024 11:41 AM Patient  Outreach Telephone from 01/29/2024 in [place] POPULATION HEALTH DEPARTMENT Most recent reading at 01/29/2024 11:22 AM Patient Outreach Telephone from 01/15/2024 in [place] POPULATION HEALTH DEPARTMENT Most recent reading at 01/15/2024  1:30 PM Patient Outreach from 01/15/2024 in [place] POPULATION HEALTH DEPARTMENT Most recent reading at 01/15/2024 10:18 AM Patient Outreach Telephone from 01/01/2024 in [place] POPULATION HEALTH DEPARTMENT Most recent reading at 01/01/2024 10:33 AM  SDOH Interventions        Food Insecurity Interventions Intervention Not Indicated Intervention Not Indicated Intervention Not Indicated  [Patient has food resources sent to him via email and mail and states he is fine with those at the moment.] Intervention Not Indicated Community Resources Provided [employer] Provided  Housing Interventions Intervention Not Indicated Intervention Not Indicated Intervention Not Indicated Intervention Not Indicated Intervention Not Indicated Intervention Not Indicated  Transportation Interventions Intervention Not Indicated Intervention Not Indicated Intervention Not Indicated  [patient has own personal transportation] Intervention Not Indicated Intervention Not Indicated Intervention Not Indicated  Utilities Interventions Intervention Not Indicated Intervention Not Indicated Intervention Not Indicated Intervention Not Indicated Intervention Not Indicated Intervention Not Indicated  Financial Strain Interventions Intervention Not Indicated Intervention Not Indicated Intervention Not Indicated -- Intervention Not Indicated Intervention Not Indicated    Recommendation:   Call Lori Sambol with [facility] about next steps in SSDI application.  Follow Up Plan:   Telephone follow up appointment date/time:  04/18/2024 at 9AM  [name], [place] /VBCI - [facility] Social Worker [phone]

## 2024-04-04 NOTE — Patient Instructions (Signed)
 Visit Information  Rodney Kane was given information about Medicaid Managed Care team care coordination services as a part of their [employer] Medicaid benefit.   If you would like to schedule transportation through your [employer] [facility] plan, please call the following number at least 2 days in advance of your appointment: [phone]  If you are experiencing a behavioral health crisis, call the [employer] [place]  Behavioral Health Crisis Line at [phone]). The line is available 24 hours a day, seven days a week.   Rodney Kane - following are the goals we discussed in your visit today:    Goals Addressed             This Visit's Progress    COMPLETED: BSW VBCI Social Work Care Plan   On track    Problems:   Food Insecurity  and applying to SSDI  CSW Clinical Goal(s):   Over the next 2 weeks the Patient will work with Child psychotherapist to address concerns related to food insecurity and applying to SSDI.  Interventions:  Pt will visit his local DSS office to apply for Food Stamps. SW will provide patient with local food pantries in his community to access while he secures a more sustainable resource (food stamps). SW will do outreach to two organizations in [place] that may be able to help Pt apply for SSDI.   Patient Goals/Self-Care Activities:  Pt will visit local DSS office to apply for Food Stamps.  Plan:   Telephone follow up appointment with care management team member scheduled for:  01/15/2024 at 10AM.          Patient verbalizes understanding of instructions and care plan provided today and agrees to view in MyChart. Active MyChart status and patient understanding of how to access instructions and care plan via MyChart confirmed with patient.     Telephone follow up appointment with Managed Medicaid care management team member scheduled for: 04/18/2024 at 9AM  [name], [place]/VBCI  - [facility] Social Worker [phone]   Following is a copy of your plan of care:  There are no care plans that you recently modified to display for this patient.

## 2024-04-05 ENCOUNTER — Other Ambulatory Visit: Payer: Self-pay

## 2024-04-05 DIAGNOSIS — E118 Type 2 diabetes mellitus with unspecified complications: Secondary | ICD-10-CM

## 2024-04-05 DIAGNOSIS — R7309 Other abnormal glucose: Secondary | ICD-10-CM

## 2024-04-05 DIAGNOSIS — I10 Essential (primary) hypertension: Secondary | ICD-10-CM

## 2024-04-05 MED ORDER — LANTUS SOLOSTAR 100 UNIT/ML ~~LOC~~ SOPN: 20.0000 [IU] | PEN_INJECTOR | Freq: Every day | SUBCUTANEOUS | 3 refills | Status: DC

## 2024-04-05 MED ORDER — METFORMIN HCL ER 500 MG PO TB24
1000.0000 mg | ORAL_TABLET | Freq: Two times a day (BID) | ORAL | 3 refills | Status: DC
Start: 2024-04-05 — End: 2024-05-24

## 2024-04-05 MED ORDER — OZEMPIC (2 MG/DOSE) 8 MG/3ML ~~LOC~~ SOPN: 2.0000 mg | PEN_INJECTOR | SUBCUTANEOUS | 5 refills | Status: DC

## 2024-04-05 NOTE — Progress Notes (Signed)
 04/05/2024 Name: Rodney Kane MRN: [id number] DOB: Jan 14, 1976  Chief Complaint  Patient presents with   Diabetes   Hypertension    Rodney Kane is a 48 y.o. year old male who was referred for medication management by their primary care provider, Rodney Bascom RAMAN, NP. They presented for telephone appointment today.   They were referred to the pharmacist by their PCP for assistance in managing diabetes. PMH includes HF (EF 45-50% in Aug 2023, LV global hypokinesis, G1DD), atrial fibrillation, HTN, CVA (2021), hx of VTE (2022), T2DM. Patient is following with complex care management for transportation needs, food insecurity, financial difficulties related to housing, and applying for disability. 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   Subjective:  Patient was initially outreached on 08/25/23 by Dorcas Solian, PharmD after patient had recently been seen in the ED 2/2 hyperglycemia and cellulitis. At in-person pharmacy appointment with me on 09/08/23, patient brought all his medication bottles and glucometer. Random BG were ranging 200-300s, with symptomatic hyperglycemia. Initiated insulin  glargine (Lantus ) 16 units daily and increased losartan  to 50 mg daily for elevated BP in the setting of HFrEF. Also restarted atorvastatin . At pharmacy f/u on 10/27/23, we initiated Ozempic , continued Lantus  and metformin , and stopped glipizide . Also recommended to start amlodipine  5 mg daily for elevated BP. Patient was seen by cardiology on 12/06/23. BP was still elevated and he was instructed to increase losartan  and metoprolol . He has also been reinitiated on a DOAC for stroke prevention given Afib dx. At pharmacy telephone appt on 12/08/23, patient reported post-prandial BG consistently > 200 mg/dL. He was having some GI upset with Ozempic , so we titrated Lantus  from 16 to 20 units daily. At last pharmacy telephone appt on 12/21/23 - patient reported having one episode of feeling  dizziness, feeling clammy, nausea, blurry vision when he was fishing and out in the sun for a while. He treated this with water and gatorade but reported that he continued to feel weak for a while. However, reported SMBG were all in the 200s. He was instructed to continue his current regimen, and we set up an appointment to initiated FL3+ CGM. His GI AE had improved after taking Ozempic  0.5 mg for a longer period of time.   He was last engaged by pharmacy via telephone on 01/01/24 at which time I assisted him in applying the FL3+ over the phone. A1C had improved to 10%. He was instructed to increase Ozempic  to 1 mg weekly. We were scheduled for f/u on 02/17/24 but I was not able to reach him. Intermittent review of his CGM report demonstrated that BG were improving after increasing Ozempic , but still slightly above goal. He has been working with SW on applying for disability.   Today patient reports doing ok. He has had an upper respiratory illness for several weeks and has not felt well. He reports that he has been tolerating Ozempic  well and taking all his medications as prescribed. Confirmed current supplies today. He has been successfully using FL3+ sensors.  Care Team: Primary Care Provider: [name], NP ; Next Scheduled Visit: 04/15/24 Cardiology: Dr. Anner - no follow-up scheduled  Medication Access/Adherence  Current Pharmacy:  [facility] [address] [place] - [address] [place] [address] Phone: [phone] Fax: [phone]   Patient reports affordability concerns with their medications: Yes  Patient reports access/transportation concerns to their pharmacy: No  Patient reports adherence concerns with their medications:  No  Patient has been connected with Medicare Complex Care Management and SW for assistance applying for disability given residual deficits with speech.   Diabetes:  Current medications: metformin  500 XR BID,  Lantus  (insulin  glargine) 20 units daily (~1PM), Ozempic  1 mg once weekly Medications tried in the past: Novolog , sitagliptin , and linagliptin  in the past  Current glucose readings: Using FL3+ CGM (Accu Chek Guide as backup)      Patient reports s/sx of hypoglycemia x 1 episode about 2 weeks ago. Denies polydipsia, polyphagia, nocturia, blurred vision.   Current meal patterns: Typically 1-2 meals/day around12-1PM and then in the evening 6-8PM. Reports that some days he does not eat any meals, but instead snacks throughout the day (like multiple granola bars). Trying to reduce drinking regular sodas, and trying to drink more water and diet/sugar free options. Reports that he continues to have food cravings and eat larger portions on Ozempic  1 mg weekly.  - Reports that it is difficult for him to cut out sweets. He will go a couple days without eating sweets, but then will eat a lot at one time.  - Lunch: not consistent, varies - Supper: varies - Drinks water all throughout the day, has switched to diet/sugar-free sodas, koolaid punch zero sugar, minutemaid zero sugar drinks  Current physical activity: occasional walking, has a shake plate at home (core exercise). No other strength training reported.   Current medication access support: insurance - Medicaid  Hypertension:  Current medications: losartan  50 mg PO daily, metoprolol  succinate 100 mg daily, amlodipine  10 mg daily Medications previously tried: furosemide  40 mg daily PRN (he was instructed to stop this at pharmacy telephone appt on 08/25/23 as it was an old Rx), hydralazine   Patient reported that he just ordered a new BP cuff - has not been checking recently.  Current blood pressure readings readings: BP continues to be elevated at wound care appts (150-160s SBP over 100s DBP)  Objective:  BP Readings from Last 3 Encounters:  01/14/24 130/89  12/07/23 (!) 156/84  10/27/23 (!) 168/109   Lab Results  Component Value Date    HGBA1C 10.0 (H) 12/31/2023   eGFR > 60 mL/min, UACR 10/01/23: 4 mg/g  Lab Results  Component Value Date   CREATININE 1.20 12/31/2023   BUN 12 12/31/2023   NA 145 (H) 12/31/2023   K 5.3 (H) 12/31/2023   CL 101 12/31/2023   CO2 24 12/31/2023    Lab Results  Component Value Date   CHOL 146 12/31/2023   HDL 51 12/31/2023   LDLCALC 82 12/31/2023   TRIG 64 12/31/2023   CHOLHDL 2.9 12/31/2023    Medications Reviewed Today     Reviewed by Brinda Lorain SQUIBB, RPH (Pharmacist) on 04/05/24 at 1301  Med List Status: <None>   Medication Order Taking? Sig Documenting Provider Last Dose Status Informant  amLODipine  (NORVASC ) 10 MG tablet [id number] Yes Take 1 tablet (10 mg total) by mouth daily. Paseda, Folashade R, FNP  Active   apixaban  (ELIQUIS ) 5 MG TABS tablet [id number] Yes Take 1 tablet (5 mg total) by mouth 2 (two) times daily. Rodney Bascom RAMAN, NP  Active   atorvastatin  (LIPITOR ) 40 MG tablet [id number] Yes Take 1 tablet (40 mg total) by mouth daily. Rodney Bascom RAMAN, NP  Active   Blood Glucose Monitoring Suppl DEVI [id number]  1 each by Does not apply route in the morning, at noon, and at bedtime. May substitute to any manufacturer covered by patient's insurance. Rodney Bascom RAMAN, NP  Active  Med Note DELSA LORAIN SHAUNNA Pablo Dec 21, 2023  2:46 PM)    Continuous Glucose Sensor (FREESTYLE LIBRE 3 PLUS SENSOR) [place] [id number] Yes Change sensor every 15 days. Rodney Bascom RAMAN, NP  Active   Insulin  Pen Needle (PEN NEEDLES) 32G X 4 MM MISC [id number]  Use as directed to inject insulin  once daily Nichols, Tonya S, NP  Active   LANTUS  SOLOSTAR 100 UNIT/ML Solostar Pen [id number] Yes Inject 20 Units into the skin daily. May increase up to 30 units daily as instructed by your provider. Rodney Bascom RAMAN, NP  Active            Med Note DELSA LORAIN SHAUNNA Pablo Dec 21, 2023  2:46 PM) Taking 20 units daily  losartan  (COZAAR ) 50 MG tablet [id number] Yes Take 1 tablet (50 mg total) by mouth daily. Paseda,  Folashade R, FNP  Active   metFORMIN  (GLUCOPHAGE -XR) 500 MG 24 hr tablet [id number] Yes Take 1 tablet (500 mg total) by mouth 2 (two) times daily with a meal. Paseda, Folashade R, FNP  Active   metoprolol  succinate (TOPROL -XL) 100 MG 24 hr tablet [id number] Yes Take 1 tablet (100 mg total) by mouth daily. Take with or immediately following a meal. Anner Alm ORN, MD  Active   Semaglutide , 1 MG/DOSE, (OZEMPIC , 1 MG/DOSE,) 4 MG/3ML SOPN [id number] Yes Inject 1 mg into the skin once a week. Paseda, Folashade R, FNP  Active   tadalafil (CIALIS) 20 MG tablet [id number]  Take 20 mg by mouth daily as needed. [provider]  Active             Assessment/Plan:   Diabetes: - Currently uncontrolled with A1c 10% above goal < 7%, but improved from 15% in January 2025. TIR is much improved at 58%, but still below goal > 70%. Will continue titrating non-insulin  antihyperglycemics. Continues to be a great candidate for GLP-1RA therapy with ASCVD history and BMI > 40. Since he is tolerating Ozempic  1 mg weekly well, will increase to 2 mg weekly at next fill. Will also maximize metformin  to 1000 mg BID today, since he will not be able to increase Ozempic  for several more weeks.  - Reviewed long term cardiovascular and renal outcomes of uncontrolled blood sugar - Reviewed goal A1c, goal fasting, and goal 2 hour post prandial glucose - Reviewed dietary modifications including increasing protein intake, avoiding carbohydrate heavy foods. Maintaining water intake and avoiding sugar-sweetened beverages. Educated extensively on lifestyle interventions to avoid GI upset with GLP-1RA.  - Reviewed lifestyle modifications including: increasing weight bearing exercise to avoid muscle loss with GLP-1RA.  - Recommend to increase Ozempic  (semaglutide ) to 2 mg injected into the skin once weekly at next fill. Will collaborate with prescriber to place orders.  - Recommend to continue insulin  glargine (Lantus ) 20 units  injected into the skin daily. - Recommend to increase metformin  XR to 1000 mg PO BID. Consider combination pill with SGLT2i in the future (dual indication for HFrEF) - Patient denies personal or family history of multiple endocrine neoplasia type 2, medullary thyroid  cancer; personal history of pancreatitis or gallbladder disease. - Recommend to monitor BG continuously with FL3+ CGM.  - Next A1C due NOW.    Hypertension/HFrEF: - Currently uncontrolled above goal < 130/80 mmHg.  Noted elevated at recent wound care appt on 12/28/23. Needs repeat BMP to assess potassium after decreasing losartan  dose. Patient would be a good candidate to switch from losartan  to Entresto for HFrEF with close  lab monitoring. Once A1C is better controlled, will consider SGLT2i.  - Reviewed long term cardiovascular and renal outcomes of uncontrolled blood pressure - Reviewed appropriate blood pressure monitoring technique and reviewed goal blood pressure. Recommended to check home blood pressure and heart rate a couple times per week.  - Recommend to continue losartan  to 50 mg daily  - Recommend to continue amlodipine  to 10 mg daily  - Recommend to continue metoprolol  100 mg daily - Repeat BMP on 04/15/24 at PCP appt to monitor K and Scr after dose-reducing losartan    Hyperlipidemia/ASCVD Risk Reduction: - Currently uncontrolled with last LDL-C of 82 mg/dL above goal < 55 mg/dL given premature ASCVD, however - he has only been taking high-intensity statin since 12/10/23. He would likely be a good candidate for Repatha in the future, but did not discuss this today.  - Reviewed long term complications of uncontrolled cholesterol - Recommend to continue atorvastatin  40 mg PO daily  - Repeat lipid panel ~ Dec 2025  Educated patient to avoid intake of alka seltzer (aspirin ) and decongestants while he is treating his URI. Provided recommendations (acetaminophen , guaifenesin ) in AVS.   Follow Up Plan: PCP 04/15/24, Pharmacist  in person 05/24/24   Lorain Baseman, PharmD [facility] [phone]

## 2024-04-08 DIAGNOSIS — N5201 Erectile dysfunction due to arterial insufficiency: Secondary | ICD-10-CM | POA: Diagnosis not present

## 2024-04-15 ENCOUNTER — Encounter: Payer: Self-pay | Admitting: Nurse Practitioner

## 2024-04-15 ENCOUNTER — Ambulatory Visit: Payer: Self-pay | Admitting: Nurse Practitioner

## 2024-04-15 VITALS — BP 139/82 | HR 115 | Temp 98.7°F | Wt 341.0 lb

## 2024-04-15 DIAGNOSIS — E118 Type 2 diabetes mellitus with unspecified complications: Secondary | ICD-10-CM

## 2024-04-15 DIAGNOSIS — Z1211 Encounter for screening for malignant neoplasm of colon: Secondary | ICD-10-CM

## 2024-04-15 DIAGNOSIS — M7502 Adhesive capsulitis of left shoulder: Secondary | ICD-10-CM

## 2024-04-15 MED ORDER — DICLOFENAC SODIUM 75 MG PO TBEC: 75.0000 mg | DELAYED_RELEASE_TABLET | Freq: Two times a day (BID) | ORAL | 0 refills | Status: DC

## 2024-04-15 NOTE — Progress Notes (Signed)
 Subjective   Patient ID: Rodney Kane, male    DOB: 1975/11/17, 48 y.o.   MRN: [id number]  Chief Complaint  Patient presents with   Diabetes    Pt presents today for a A1C check.    Shoulder Pain    Pt states he is having limited mobility in his shoulder, pt states it causes him pain and on the pain scale it would be about a 8. States it been going on for 2 months.    Referring provider: [name], NP  Rodney Kane is a 48 y.o. male with Past Medical History: 06/2020: Coronavirus infection 06/2020: COVID No date: Diabetes mellitus without complication (HCC) 12/2019: Hemoglobin A1C greater than 9%, indicating poor diabetic  control 12/2019: Hyperglycemia 12/2019: Hyperlipidemia No date: Hypertension 12/2019: Hypertensive urgency 12/2019: Noncompliance with medication regimen 12/2016: Nonischemic (hypertensive) dilated cardiomyopathy (HCC)     Comment:  EF has been 30 to 35%, recently lower. No date: Stroke [facility]) 12/2019: Urine ketones 12/2019: Vitamin D  deficiency   Shoulder Pain  The pain is present in the left shoulder. This is a chronic problem. The current episode started more than 1 month ago. There has been no history of extremity trauma. The problem occurs intermittently. The problem has been gradually worsening. The pain is moderate. Associated symptoms include an inability to bear weight, joint locking and stiffness. The symptoms are aggravated by activity. He has tried nothing for the symptoms.    Patient presents today for diabetes and hypertension follow-up. Patient has been noncompliant with his medications and did not take medications today.  Hemoglobin A1c is improving at 8.8 today in office. Following with cardiology and neurology for history of stroke and A-fib. Following with pharmacy for medication management. Denies f/c/s, n/v/d, hemoptysis, PND, chest pain or edema.     No Known Allergies  Immunization History  Administered  Date(s) Administered   Tdap 12/01/2017    Tobacco History: Social History   Tobacco Use  Smoking Status Never  Smokeless Tobacco Never   Counseling given: Not Answered   Outpatient Encounter Medications as of 04/15/2024  Medication Sig   amLODipine  (NORVASC ) 10 MG tablet Take 1 tablet (10 mg total) by mouth daily.   apixaban  (ELIQUIS ) 5 MG TABS tablet Take 1 tablet (5 mg total) by mouth 2 (two) times daily.   atorvastatin  (LIPITOR ) 40 MG tablet Take 1 tablet (40 mg total) by mouth daily.   Blood Glucose Monitoring Suppl DEVI 1 each by Does not apply route in the morning, at noon, and at bedtime. May substitute to any manufacturer covered by patient's insurance.   Continuous Glucose Sensor (FREESTYLE LIBRE 3 PLUS SENSOR) MISC Change sensor every 15 days.   diclofenac  (VOLTAREN ) 75 MG EC tablet Take 1 tablet (75 mg total) by mouth 2 (two) times daily.   Insulin  Pen Needle (PEN NEEDLES) 32G X 4 MM MISC Use as directed to inject insulin  once daily   LANTUS  SOLOSTAR 100 UNIT/ML Solostar Pen Inject 20 Units into the skin daily. May increase up to 30 units daily as instructed by your provider.   losartan  (COZAAR ) 50 MG tablet Take 1 tablet (50 mg total) by mouth daily.   metFORMIN  (GLUCOPHAGE -XR) 500 MG 24 hr tablet Take 2 tablets (1,000 mg total) by mouth 2 (two) times daily with a meal.   metoprolol  succinate (TOPROL -XL) 100 MG 24 hr tablet Take 1 tablet (100 mg total) by mouth daily. Take with or immediately following a meal.   Semaglutide , 1  MG/DOSE, (OZEMPIC , 1 MG/DOSE,) 4 MG/3ML SOPN Inject 1 mg into the skin once a week.   Semaglutide , 2 MG/DOSE, (OZEMPIC , 2 MG/DOSE,) 8 MG/3ML SOPN Inject 2 mg into the skin once a week.   tadalafil (CIALIS) 20 MG tablet Take 20 mg by mouth daily as needed.   No facility-administered encounter medications on file as of 04/15/2024.    Review of Systems  Review of Systems  Constitutional: Negative.   HENT: Negative.    Cardiovascular: Negative.    Gastrointestinal: Negative.   Musculoskeletal:  Positive for stiffness.  Allergic/Immunologic: Negative.   Neurological: Negative.   Psychiatric/Behavioral: Negative.       Objective:   BP 139/82 (BP Location: Left Arm, Patient Position: Sitting, Cuff Size: Large)   Pulse (!) 115   Temp 98.7 F (37.1 C)   Wt (!) 341 lb (154.7 kg)   SpO2 98%   BMI 44.99 kg/m   Wt Readings from Last 5 Encounters:  04/15/24 (!) 341 lb (154.7 kg)  01/14/24 (!) 341 lb (154.7 kg)  12/17/23 (!) 342 lb (155.1 kg)  12/07/23 (!) 336 lb 12.8 oz (152.8 kg)  10/01/23 (!) 341 lb (154.7 kg)     Physical Exam Vitals and nursing note reviewed.  Constitutional:      General: He is not in acute distress.    Appearance: He is well-developed.  Cardiovascular:     Rate and Rhythm: Normal rate and regular rhythm.  Pulmonary:     Effort: Pulmonary effort is normal.     Breath sounds: Normal breath sounds.  Skin:    General: Skin is warm and dry.  Neurological:     Mental Status: He is alert and oriented to person, place, and time.       Assessment & Plan:   Adhesive capsulitis of left shoulder -     Ambulatory referral to Orthopedic Surgery -     Diclofenac  Sodium; Take 1 tablet (75 mg total) by mouth 2 (two) times daily.  Dispense: 30 tablet; Refill: 0  Type 2 diabetes mellitus with complication, without long-term current use of insulin  (HCC) -     Basic metabolic panel with GFR     Return in about 3 months (around 07/15/2024).     Bascom GORMAN Borer, NP 04/15/2024

## 2024-04-16 LAB — BASIC METABOLIC PANEL WITH GFR
BUN/Creatinine Ratio: 7 — ABNORMAL LOW (ref 9–20)
BUN: 10 mg/dL (ref 6–24)
CO2: 24 mmol/L (ref 20–29)
Calcium: 9.7 mg/dL (ref 8.7–10.2)
Chloride: 101 mmol/L (ref 96–106)
Creatinine, Ser: 1.4 mg/dL — ABNORMAL HIGH (ref 0.76–1.27)
Glucose: 186 mg/dL — ABNORMAL HIGH (ref 70–99)
Potassium: 5.1 mmol/L (ref 3.5–5.2)
Sodium: 141 mmol/L (ref 134–144)
eGFR: 62 mL/min/1.73 (ref >59)

## 2024-04-18 ENCOUNTER — Ambulatory Visit: Payer: Self-pay | Admitting: Nurse Practitioner

## 2024-04-18 ENCOUNTER — Other Ambulatory Visit: Payer: Self-pay

## 2024-04-18 NOTE — Patient Outreach (Signed)
 Complex Care Management   Visit Note  04/18/2024  Name:  Rodney Kane MRN: [id number] DOB: 10/23/1975  Situation: Referral received for Complex Care Management related to Heart Failure, Diabetes with Complications, and HTN I obtained verbal consent from Patient.  Visit completed with Patient  on the phone  Background:   Past Medical History:  Diagnosis Date   Coronavirus infection 06/2020   COVID 06/2020   Diabetes mellitus without complication (HCC)    Hemoglobin A1C greater than 9%, indicating poor diabetic control 12/2019   Hyperglycemia 12/2019   Hyperlipidemia 12/2019   Hypertension    Hypertensive urgency 12/2019   Noncompliance with medication regimen 12/2019   Nonischemic (hypertensive) dilated cardiomyopathy (HCC) 12/2016   EF has been 30 to 35%, recently lower.   Stroke [facility])    Urine ketones 12/2019   Vitamin D  deficiency 12/2019    Assessment: Patient Reported Symptoms:  Cognitive Cognitive Status: Able to follow simple commands, Alert and oriented to person, place, and time, Normal speech and language skills (Difficulty speaking/stuttering) Cognitive/Intellectual Conditions Management [RPT]: Brain Injury Brain Injury: CVA (2021, 2022)   Health Maintenance Behaviors: Annual physical exam  Neurological Neurological Review of Symptoms: No symptoms reported    HEENT HEENT Symptoms Reported: No symptoms reported      Cardiovascular Cardiovascular Symptoms Reported: No symptoms reported Does patient have uncontrolled Hypertension?: Yes Is patient checking Blood Pressure at home?: Yes Patient's Recent BP reading at home: Patient reports he still has not been checking BP at home. He reports he just bought a new monitor. He reports he has plans to start checking. Note per chart review BP was 139/82 at most recent PCP visit. Advised keeping a log of BP readings and bringing to all future provider appointments Cardiovascular Management Strategies: Medication  therapy, Routine screening Cardiovascular Comment: Patient reports he is no longer having issues with palpitations  Respiratory Respiratory Symptoms Reported: No symptoms reported    Endocrine Endocrine Symptoms Reported: No symptoms reported Is patient diabetic?: Yes Is patient checking blood sugars at home?: Yes List most recent blood sugar readings, include date and time of day: CGM (FreeStyle [place]), 161 at time of assessment Endocrine Comment: Note per chart review that patient's A1c went down to 8.8% per PCP visit 04/15/24. Also noted that Ozempic  and Metformin  were increased by pharmacist 04/05/24. Ensured patient understands dosage changes  Gastrointestinal Gastrointestinal Symptoms Reported: Reflux/heartburn Additional Gastrointestinal Details: Patient reports recent issues with indigestion relieved with Alkaseltzer. Reviewed heartburn prevention. Patient reports regular BMs. Gastrointestinal Management Strategies: Fluid modification, Diet modification, Medication therapy    Genitourinary Genitourinary Symptoms Reported: No symptoms reported    Integumentary Additional Integumentary Details: Patient reports LLE wound is closed. He is no longer having to do dressing changes or go to the wound care clinic. Skin Management Strategies: Routine screening  Musculoskeletal Musculoskelatal Symptoms Reviewed: Joint pain Additional Musculoskeletal Details: Patient reports it feels like my shoulder has a catch and it hurts real bad. Pain comes and goes and is worse with movement. Patient reports he is not taking anything for pain or performing nonpharmacologic interventions. Patient did discuss this with PCP who placed referral for orthopedic surgeon Musculoskeletal Management Strategies: Routine screening Falls in the past year?: No Number of falls in past year: 1 or less Was there an injury with Fall?: No Fall Risk Category Calculator: 0 Patient Fall Risk Level: Low Fall Risk Patient at  Risk for Falls Due to: No Fall Risks Fall risk Follow up: Falls evaluation completed, Education  provided, Falls prevention discussed  Psychosocial            04/18/2024    PHQ2-9 Depression Screening   Little interest or pleasure in doing things    Feeling down, depressed, or hopeless    PHQ-2 - Total Score    Trouble falling or staying asleep, or sleeping too much    Feeling tired or having little energy    Poor appetite or overeating     Feeling bad about yourself - or that you are a failure or have let yourself or your family down    Trouble concentrating on things, such as reading the newspaper or watching television    Moving or speaking so slowly that other people could have noticed.  Or the opposite - being so fidgety or restless that you have been moving around a lot more than usual    Thoughts that you would be better off dead, or hurting yourself in some way    PHQ2-9 Total Score    If you checked off any problems, how difficult have these problems made it for you to do your work, take care of things at home, or get along with other people    Depression Interventions/Treatment      There were no vitals filed for this visit.  Medications Reviewed Today     Reviewed by Arno Rosaline SQUIBB, RN (Registered Nurse) on 04/18/24 at 1316  Med List Status: <None>   Medication Order Taking? Sig Documenting Provider Last Dose Status Informant  amLODipine  (NORVASC ) 10 MG tablet [id number]  Take 1 tablet (10 mg total) by mouth daily. Paseda, Folashade R, FNP  Active   apixaban  (ELIQUIS ) 5 MG TABS tablet [id number]  Take 1 tablet (5 mg total) by mouth 2 (two) times daily. [name], NP  Active   atorvastatin  (LIPITOR ) 40 MG tablet [id number]  Take 1 tablet (40 mg total) by mouth daily. [name], NP  Active   Blood Glucose Monitoring Suppl DEVI [id number]  1 each by Does not apply route in the morning, at noon, and at bedtime. May substitute to any manufacturer covered by  patient's insurance. [name], NP  Active            Med Note DELSA LORAIN SQUIBB Pablo Dec 21, 2023  2:46 PM)    Continuous Glucose Sensor (FREESTYLE LIBRE 3 PLUS SENSOR) [place] [id number]  Change sensor every 15 days. [name], NP  Active   diclofenac  (VOLTAREN ) 75 MG EC tablet [id number]  Take 1 tablet (75 mg total) by mouth 2 (two) times daily. [name], NP  Active   Insulin  Pen Needle (PEN NEEDLES) 32G X 4 MM MISC [id number]  Use as directed to inject insulin  once daily Nichols, Tonya S, NP  Active   LANTUS  SOLOSTAR 100 UNIT/ML Solostar Pen [id number]  Inject 20 Units into the skin daily. May increase up to 30 units daily as instructed by your provider. [name], NP  Active   losartan  (COZAAR ) 50 MG tablet [id number]  Take 1 tablet (50 mg total) by mouth daily. Paseda, Folashade R, FNP  Expired 04/15/24 2359   metFORMIN  (GLUCOPHAGE -XR) 500 MG 24 hr tablet [id number]  Take 2 tablets (1,000 mg total) by mouth 2 (two) times daily with a meal. [name], NP  Active   metoprolol  succinate (TOPROL -XL) 100 MG 24 hr tablet [id number]  Take 1 tablet (100 mg total) by mouth daily. Take with or immediately  following a meal. Anner Alm ORN, MD  Active   Semaglutide , 1 MG/DOSE, (OZEMPIC , 1 MG/DOSE,) 4 MG/3ML SOPN [id number]  Inject 1 mg into the skin once a week. Paseda, Folashade R, FNP  Active   Semaglutide , 2 MG/DOSE, (OZEMPIC , 2 MG/DOSE,) 8 MG/3ML SOPN [id number]  Inject 2 mg into the skin once a week. [name], NP  Active   tadalafil (CIALIS) 20 MG tablet [id number]  Take 20 mg by mouth daily as needed. [provider]  Active             Recommendation:   Continue Current Plan of Care  Follow Up Plan:   Telephone follow up appointment date/time:  05/16/24 at 1:30 PM  [name], RN MSN [place]  [facility] Health RN Care Manager Direct Dial: [phone]  Fax: [phone]

## 2024-04-18 NOTE — Patient Instructions (Signed)
 Visit Information  Mr. Barkdull was given information about Medicaid Managed Care team care coordination services as a part of their [employer] Medicaid benefit.   If you would like to schedule transportation through your [facility] plan, please call the following number at least 2 days in advance of your appointment: [phone]  If you are experiencing a behavioral health crisis, call the [employer] [place]  Behavioral Health Crisis Line at [phone]). The line is available 24 hours a day, seven days a week.    Patient verbalizes understanding of instructions and care plan provided today and agrees to view in MyChart. Active MyChart status and patient understanding of how to access instructions and care plan via MyChart confirmed with patient.     Telephone follow up appointment with Managed Medicaid care management team member scheduled for: 05/03/2024 at 2:30pm  [name], BSW [place]/VBCI - [facility] Social Worker [phone]   Following is a copy of your plan of care:  There are no care plans that you recently modified to display for this patient.

## 2024-04-18 NOTE — Patient Instructions (Signed)
 Visit Information  [name] was given information about Medicaid Managed Care team care coordination services as a part of their [employer] Medicaid benefit.   If you would like to schedule transportation through your [employer] [facility] plan, please call the following number at least 2 days in advance of your appointment: [phone]  If you are experiencing a behavioral health crisis, call the [employer] [place]  Behavioral Health Crisis Line at [phone]). The line is available 24 hours a day, seven days a week.   Patient verbalizes understanding of instructions and care plan provided today and agrees to view in MyChart. Active MyChart status and patient understanding of how to access instructions and care plan via MyChart confirmed with patient.     Telephone follow up appointment with Managed Medicaid care management team member scheduled for: 05/16/24 at 1:30 PM  Rodney Finlay, RN MSN [place]  VBCI Population Health RN Care Manager Direct Dial: [phone]  Fax: [phone]   Following is a copy of your plan of care:  There are no care plans that you recently modified to display for this patient.

## 2024-04-18 NOTE — Patient Outreach (Signed)
 Complex Care Management   Visit Note  04/18/2024  Name:  Rodney Kane MRN: [id number] DOB: 05/07/1976  Situation: Referral received for Complex Care Management related to SDOH Barriers:  SSDI application assistance I obtained verbal consent from Patient.  Visit completed with Patient  on the phone  Background:   Past Medical History:  Diagnosis Date   Coronavirus infection 06/2020   COVID 06/2020   Diabetes mellitus without complication (HCC)    Hemoglobin A1C greater than 9%, indicating poor diabetic control 12/2019   Hyperglycemia 12/2019   Hyperlipidemia 12/2019   Hypertension    Hypertensive urgency 12/2019   Noncompliance with medication regimen 12/2019   Nonischemic (hypertensive) dilated cardiomyopathy (HCC) 12/2016   EF has been 30 to 35%, recently lower.   Stroke [facility])    Urine ketones 12/2019   Vitamin D  deficiency 12/2019    Assessment: BSW held f/u appt with pt. Pt was alert and cognitive. Pt confirmed no other needs at this time. Pt states he reached out to Lori Sambol [phone] ext [phone]) re the paperwork he sent in and any next steps in the SSDI application process but has not heard back from her. BSW encouraged pt to continue reaching her at different points during the day. Pt agreed.   BSW and pt called Katheryn together but was not successful in speaking with her. BSW left VM requesting her call patient back directly to provide confirmation that his paperwork was received by them and any additional next steps.  SDOH Interventions    Flowsheet Row Patient Outreach Telephone from 04/04/2024 in [place] POPULATION HEALTH DEPARTMENT Most recent reading at 04/04/2024 11:36 AM Patient Outreach Telephone from 03/18/2024 in [place] POPULATION HEALTH DEPARTMENT Most recent reading at 03/18/2024 11:41 AM Patient Outreach Telephone from 01/29/2024 in [place] POPULATION HEALTH DEPARTMENT Most recent reading at 01/29/2024 11:22 AM Patient Outreach Telephone from  01/15/2024 in [place] POPULATION HEALTH DEPARTMENT Most recent reading at 01/15/2024  1:30 PM Patient Outreach from 01/15/2024 in [place] POPULATION HEALTH DEPARTMENT Most recent reading at 01/15/2024 10:18 AM Patient Outreach Telephone from 01/01/2024 in [place] POPULATION HEALTH DEPARTMENT Most recent reading at 01/01/2024 10:33 AM  SDOH Interventions        Food Insecurity Interventions Intervention Not Indicated Intervention Not Indicated Intervention Not Indicated  [Patient has food resources sent to him via email and mail and states he is fine with those at the moment.] Intervention Not Indicated Community Resources Provided [employer] Provided  Housing Interventions Intervention Not Indicated Intervention Not Indicated Intervention Not Indicated Intervention Not Indicated Intervention Not Indicated Intervention Not Indicated  Transportation Interventions Intervention Not Indicated Intervention Not Indicated Intervention Not Indicated  [patient has own personal transportation] Intervention Not Indicated Intervention Not Indicated Intervention Not Indicated  Utilities Interventions Intervention Not Indicated Intervention Not Indicated Intervention Not Indicated Intervention Not Indicated Intervention Not Indicated Intervention Not Indicated  Financial Strain Interventions Intervention Not Indicated Intervention Not Indicated Intervention Not Indicated -- Intervention Not Indicated Intervention Not Indicated       Recommendation:   Continue outreach to Lori Sambol to confirm additional next steps in SSDI application.  Follow Up Plan:   Telephone follow up appointment date/time:  05/03/2024 2:30pm.   [name], BSW [place]/VBCI - [facility] Social Worker [phone]

## 2024-04-21 ENCOUNTER — Other Ambulatory Visit: Payer: Self-pay | Admitting: Licensed Clinical Social Worker

## 2024-04-21 NOTE — Patient Instructions (Signed)
 Visit Information  [name] was given information about Medicaid Managed Care team care coordination services as a part of their [employer] Medicaid benefit.   If you would like to schedule transportation through your [facility] plan, please call the following number at least 2 days in advance of your appointment: [phone]  If you are experiencing a behavioral health crisis, call the [employer] [place]  Behavioral Health Crisis Line at [phone]). The line is available 24 hours a day, seven days a week.   Please see education materials related to topics discussed provided by MyChart link.  Patient verbalizes understanding of instructions and care plan provided today and agrees to view in MyChart. Active MyChart status and patient understanding of how to access instructions and care plan via MyChart confirmed with patient.     No further follow up required: Pt completed all goals for LCSW  Rolin Kerns, LCSW [place]  [facility], [facility] Clinical Social Worker Direct Dial: [phone]  Fax: [phone] Website: [email or web address] 2:31 PM   Following is a copy of your plan of care:  There are no care plans that you recently modified to display for this patient.

## 2024-05-03 ENCOUNTER — Other Ambulatory Visit: Payer: Self-pay

## 2024-05-03 NOTE — Patient Outreach (Signed)
 Complex Care Management   Visit Note  05/03/2024  Name:  Rodney Kane MRN: [id number] DOB: August 22, 1975  Situation: Referral received for Complex Care Management related to Disability application assistance I obtained verbal consent from Patient.  Visit completed with Patient  on the phone  Background:   Past Medical History:  Diagnosis Date   Coronavirus infection 06/2020   COVID 06/2020   Diabetes mellitus without complication (HCC)    Hemoglobin A1C greater than 9%, indicating poor diabetic control 12/2019   Hyperglycemia 12/2019   Hyperlipidemia 12/2019   Hypertension    Hypertensive urgency 12/2019   Noncompliance with medication regimen 12/2019   Nonischemic (hypertensive) dilated cardiomyopathy (HCC) 12/2016   EF has been 30 to 35%, recently lower.   Stroke [facility])    Urine ketones 12/2019   Vitamin D  deficiency 12/2019    Assessment: BSW held f/u with pt. Pt was alert and cognitive. Pt reports he is doing well and has no other needs at this time. Pt reports he heard back from [place] re the paperwork he sent back to her. Katheryn confirms it was received. Pt states he last spoke with her about 1.5 weeks ago and was told she was finalizing his application and would be submitting soon. Pt states he has not heard back from her since then. BSW encouraged pt to f/u with Katheryn and request an update on his application/next steps. Pt understood and agreed. BSW and pt agreed to re-assess need for further follow ups at next f/u appt. No other resources were provided/requested at this time.   SDOH Interventions    Flowsheet Row Patient Outreach Telephone from 05/03/2024 in [place] POPULATION HEALTH DEPARTMENT Most recent reading at 05/03/2024  2:44 PM Patient Outreach Telephone from 04/04/2024 in [place] POPULATION HEALTH DEPARTMENT Most recent reading at 04/04/2024 11:36 AM Patient Outreach Telephone from 03/18/2024 in [place] POPULATION HEALTH DEPARTMENT Most recent reading  at 03/18/2024 11:41 AM Patient Outreach Telephone from 01/29/2024 in [place] POPULATION HEALTH DEPARTMENT Most recent reading at 01/29/2024 11:22 AM Patient Outreach Telephone from 01/15/2024 in [place] POPULATION HEALTH DEPARTMENT Most recent reading at 01/15/2024  1:30 PM Patient Outreach from 01/15/2024 in [place] POPULATION HEALTH DEPARTMENT Most recent reading at 01/15/2024 10:18 AM  SDOH Interventions        Food Insecurity Interventions -- Intervention Not Indicated Intervention Not Indicated Intervention Not Indicated  [Patient has food resources sent to him via email and mail and states he is fine with those at the moment.] Intervention Not Indicated Community Resources Provided  Housing Interventions Intervention Not Indicated Intervention Not Indicated Intervention Not Indicated Intervention Not Indicated Intervention Not Indicated Intervention Not Indicated  Transportation Interventions Intervention Not Indicated Intervention Not Indicated Intervention Not Indicated Intervention Not Indicated  [patient has own personal transportation] Intervention Not Indicated Intervention Not Indicated  Utilities Interventions Intervention Not Indicated Intervention Not Indicated Intervention Not Indicated Intervention Not Indicated Intervention Not Indicated Intervention Not Indicated  Financial Strain Interventions Intervention Not Indicated Intervention Not Indicated Intervention Not Indicated Intervention Not Indicated -- Intervention Not Indicated      Recommendation:   F/u with Katheryn at [facility] re application.   Follow Up Plan:   Telephone follow up appointment date/time:  05/20/2024 at 1:30pm  [name], BSW [place]/VBCI - [facility] Social Worker [phone]

## 2024-05-03 NOTE — Patient Instructions (Signed)
 Visit Information  Mr. Sleight was given information about Medicaid Managed Care team care coordination services as a part of their [employer] Medicaid benefit.   If you would like to schedule transportation through your [facility] [facility] plan, please call the following number at least 2 days in advance of your appointment: [phone]  If you are experiencing a behavioral health crisis, call the [employer]   Behavioral Health Crisis Line at [phone]). The line is available 24 hours a day, seven days a week.   Patient verbalizes understanding of instructions and care plan provided today and agrees to view in MyChart. Active MyChart status and patient understanding of how to access instructions and care plan via MyChart confirmed with patient.     Telephone follow up appointment with Managed Medicaid care management team member scheduled for: 05/20/2024 at 1:30pm  [name], BSW [place]/VBCI - [facility] Social Worker [phone]   Following is a copy of your plan of care:  There are no care plans that you recently modified to display for this patient.

## 2024-05-16 ENCOUNTER — Other Ambulatory Visit: Payer: Self-pay

## 2024-05-16 NOTE — Patient Outreach (Signed)
 Complex Care Management   Visit Note  05/16/2024  Name:  Rodney Kane MRN: [id number] DOB: September 22, 1975  Situation: Referral received for Complex Care Management related to Heart Failure, Diabetes with Complications, and HTN I obtained verbal consent from Patient.  Visit completed with Patient  on the phone  Background:   Past Medical History:  Diagnosis Date   Coronavirus infection 06/2020   COVID 06/2020   Diabetes mellitus without complication (HCC)    Hemoglobin A1C greater than 9%, indicating poor diabetic control 12/2019   Hyperglycemia 12/2019   Hyperlipidemia 12/2019   Hypertension    Hypertensive urgency 12/2019   Noncompliance with medication regimen 12/2019   Nonischemic (hypertensive) dilated cardiomyopathy (HCC) 12/2016   EF has been 30 to 35%, recently lower.   Stroke [facility])    Urine ketones 12/2019   Vitamin D  deficiency 12/2019    Assessment: Patient Reported Symptoms:  Cognitive Cognitive Status: Able to follow simple commands, Alert and oriented to person, place, and time, Normal speech and language skills (Difficulty speaking/stuttering) Cognitive/Intellectual Conditions Management [RPT]: Brain Injury Brain Injury: CVA (2021, 2022)   Health Maintenance Behaviors: Annual physical exam  Neurological Neurological Review of Symptoms: Not assessed    HEENT HEENT Symptoms Reported: Not assessed      Cardiovascular Cardiovascular Symptoms Reported: No symptoms reported Does patient have uncontrolled Hypertension?: Yes Is patient checking Blood Pressure at home?: Yes Patient's Recent BP reading at home: 123/88 Cardiovascular Management Strategies: Medication therapy, Routine screening, Weight management Do You Have a Working Readable Scale?: Other (Comment) (Patient reports he just bought a new scale and plans to start weighing daily) Cardiovascular Comment: Patient reports he did have palpitations the other night, but they have not been occuring on a  regular basis. He reports he mostly notices palpitations at nighttime. Note per chart review, last cardiology visit was in May 2025, and follow-up was indicated in 5 months. Provided patient with cardiology office phone number to schedule appointment [phone].  Respiratory Respiratory Symptoms Reported: No symptoms reported    Endocrine Endocrine Symptoms Reported: Other Other symptoms related to hypoglycemia or hyperglycemia: Patient reports occasional episodes of overnight hypoglycemia. He reports his blood sugar will drop to the 50's then will come back up after a few minutes. Advised patient to keep track of these episodes and notify PCP or pharmacist if they become more regular. Reminded of upcoming pharmacy appoinment 05/24/24 Is patient diabetic?: Yes Is patient checking blood sugars at home?: Yes List most recent blood sugar readings, include date and time of day: FreeStyle Libre, 119 at time of assessment    Gastrointestinal Gastrointestinal Symptoms Reported: No symptoms reported Additional Gastrointestinal Details: Patient reports increased dose of Ozempic  is working to decrease his appetite. He reports he cannot tell if he has lost weight. He reports he continues to have regular BMs.      Genitourinary Genitourinary Symptoms Reported: No symptoms reported    Integumentary Integumentary Symptoms Reported: No symptoms reported    Musculoskeletal Musculoskelatal Symptoms Reviewed: Joint pain Additional Musculoskeletal Details: Patient reports he has not heard from orthopedic surgeon office regarding referral placed by PCP 04/15/24. Unable to see where referral was sent per chart review. Patient reports pain comes and goes, and is worse with movement. Patient reports he is not taking any OTC medication for pain relief. He reports PCP ordered medication, but his pharmacy has not had it ready for pick-up. Per chart review, Diclofenac  Sodium ordered by PCP. Call placed to [facility] pharmacy to  confirm  they have recieved prescription. Musculoskeletal Management Strategies: Routine screening, Coping strategies Falls in the past year?: No Number of falls in past year: 1 or less Was there an injury with Fall?: No Fall Risk Category Calculator: 0 Patient Fall Risk Level: Low Fall Risk Patient at Risk for Falls Due to: No Fall Risks Fall risk Follow up: Falls evaluation completed, Education provided, Falls prevention discussed  Psychosocial Psychosocial Symptoms Reported: Not assessed          05/16/2024    PHQ2-9 Depression Screening   Little interest or pleasure in doing things    Feeling down, depressed, or hopeless    PHQ-2 - Total Score    Trouble falling or staying asleep, or sleeping too much    Feeling tired or having little energy    Poor appetite or overeating     Feeling bad about yourself - or that you are a failure or have let yourself or your family down    Trouble concentrating on things, such as reading the newspaper or watching television    Moving or speaking so slowly that other people could have noticed.  Or the opposite - being so fidgety or restless that you have been moving around a lot more than usual    Thoughts that you would be better off dead, or hurting yourself in some way    PHQ2-9 Total Score    If you checked off any problems, how difficult have these problems made it for you to do your work, take care of things at home, or get along with other people    Depression Interventions/Treatment      There were no vitals filed for this visit.  Medications Reviewed Today     Reviewed by Arno Rosaline SQUIBB, RN (Registered Nurse) on 05/16/24 at 1352  Med List Status: <None>   Medication Order Taking? Sig Documenting Provider Last Dose Status Informant  amLODipine  (NORVASC ) 10 MG tablet [id number]  Take 1 tablet (10 mg total) by mouth daily. Paseda, Folashade R, FNP  Active   apixaban  (ELIQUIS ) 5 MG TABS tablet [id number]  Take 1 tablet (5 mg total)  by mouth 2 (two) times daily. [name], NP  Active   atorvastatin  (LIPITOR ) 40 MG tablet [id number]  Take 1 tablet (40 mg total) by mouth daily. [name], NP  Active   Blood Glucose Monitoring Suppl DEVI [id number]  1 each by Does not apply route in the morning, at noon, and at bedtime. May substitute to any manufacturer covered by patient's insurance. [name], NP  Active            Med Note DELSA LORAIN SQUIBB Pablo Dec 21, 2023  2:46 PM)    Continuous Glucose Sensor (FREESTYLE LIBRE 3 PLUS SENSOR) [place] [id number]  Change sensor every 15 days. [name], NP  Active   diclofenac  (VOLTAREN ) 75 MG EC tablet [id number]  Take 1 tablet (75 mg total) by mouth 2 (two) times daily. [name], NP  Active   Insulin  Pen Needle (PEN NEEDLES) 32G X 4 MM MISC [id number]  Use as directed to inject insulin  once daily Nichols, Tonya S, NP  Active   LANTUS  SOLOSTAR 100 UNIT/ML Solostar Pen [id number]  Inject 20 Units into the skin daily. May increase up to 30 units daily as instructed by your provider. [name], NP  Active   losartan  (COZAAR ) 50 MG tablet [id number]  Take 1 tablet (50 mg total) by mouth  daily. Paseda, Folashade R, FNP  Expired 04/15/24 2359   metFORMIN  (GLUCOPHAGE -XR) 500 MG 24 hr tablet [id number]  Take 2 tablets (1,000 mg total) by mouth 2 (two) times daily with a meal. [name], NP  Active   metoprolol  succinate (TOPROL -XL) 100 MG 24 hr tablet [id number]  Take 1 tablet (100 mg total) by mouth daily. Take with or immediately following a meal. Anner Alm ORN, MD  Active   Semaglutide , 1 MG/DOSE, (OZEMPIC , 1 MG/DOSE,) 4 MG/3ML SOPN [id number]  Inject 1 mg into the skin once a week. Paseda, Folashade R, FNP  Active   Semaglutide , 2 MG/DOSE, (OZEMPIC , 2 MG/DOSE,) 8 MG/3ML SOPN [id number]  Inject 2 mg into the skin once a week. [name], NP  Active   tadalafil (CIALIS) 20 MG tablet [id number]  Take 20 mg by mouth daily as needed. [provider]   Active             Recommendation:   Continue Current Plan of Care  Follow Up Plan:   Telephone follow up appointment date/time:  06/13/24 at 1:30 PM  [name], RN MSN [place]  [facility] RN Care Manager Direct Dial: [phone]  Fax: [phone]

## 2024-05-16 NOTE — Patient Instructions (Signed)
 Visit Information  Rodney Kane was given information about Medicaid Managed Care team care coordination services as a part of their [employer] Medicaid benefit.   If you would like to schedule transportation through your [employer] [facility] plan, please call the following number at least 2 days in advance of your appointment: [phone]  If you are experiencing a behavioral health crisis, call the [employer] [place]  Behavioral Health Crisis Line at [phone]). The line is available 24 hours a day, seven days a week.   Care plan and visit instructions communicated with the patient verbally today. Patient agrees to receive a copy in MyChart. Active MyChart status and patient understanding of how to access instructions and care plan via MyChart confirmed with patient.     Telephone follow up appointment with Managed Medicaid care management team member scheduled for: 06/13/24 at 1:30 PM  [name], RN MSN [place]  [facility] RN Care Manager Direct Dial: [phone]  Fax: [phone]   Following is a copy of your plan of care:  There are no care plans that you recently modified to display for this patient.

## 2024-05-20 ENCOUNTER — Other Ambulatory Visit: Payer: Self-pay

## 2024-05-20 NOTE — Patient Instructions (Signed)
 Visit Information  Rodney Kane was given information about Medicaid Managed Care team care coordination services as a part of their [employer] Medicaid benefit.   If you would like to schedule transportation through your [facility] [facility] plan, please call the following number at least 2 days in advance of your appointment: [phone]  If you are experiencing a behavioral health crisis, call the [employer] [place]  Behavioral Health Crisis Line at [phone]). The line is available 24 hours a day, seven days a week.    Care plan and visit instructions communicated with the patient verbally today. Patient agrees to receive a copy in MyChart. Active MyChart status and patient understanding of how to access instructions and care plan via MyChart confirmed with patient.     No further follow up required: no other needs at this time.   [name], BSW [place]/[facility]BCI - [employer] Social Worker [phone]   Following is a copy of your plan of care:  There are no care plans that you recently modified to display for this patient.

## 2024-05-20 NOTE — Patient Outreach (Signed)
 Social Drivers of Health  Community Resource and Care Coordination Visit Note   05/20/2024  Name: Rodney Kane MRN: [id number] DOB:August 28, 1975  Situation: Referral received for [facility] needs assessment and assistance related to None SSDI/SSI application assistance. I obtained verbal consent from Patient.  Visit completed with Patient on the phone.   Background:   SDOH Interventions Today    Flowsheet Row Most Recent Value  SDOH Interventions   Food Insecurity Interventions Intervention Not Indicated  Housing Interventions Intervention Not Indicated  Transportation Interventions Intervention Not Indicated  Utilities Interventions Intervention Not Indicated  Financial Strain Interventions Intervention Not Indicated     Assessment: BSW held f/u appt with pt. Pt was alert and cognitive. Pt confirmed he is actively working with the servant center re his SSDI/SSI application. Pt reports he recently received a letter from them but does not understand it. Pt will be calling servant center to get clarification on letter. Pt confirmed no other needs at this time and agreed to closing out case. No additional resources were provided at this time.    Goals Addressed   None     Recommendation:   attend all scheduled provider appointments Follow up with servant center re SSI/SSDI application  Follow Up Plan:   Patient has achieved all patient stated goals. [employer] will be closed. Patient has been provided contact information should new needs arise.   [name], BSW [place]/VBCI - [employer] Social Worker [phone]

## 2024-05-24 ENCOUNTER — Telehealth: Payer: Self-pay

## 2024-05-24 ENCOUNTER — Ambulatory Visit: Payer: Self-pay

## 2024-05-24 VITALS — BP 129/89 | HR 103 | Wt 340.6 lb

## 2024-05-24 DIAGNOSIS — Z794 Long term (current) use of insulin: Secondary | ICD-10-CM

## 2024-05-24 DIAGNOSIS — E118 Type 2 diabetes mellitus with unspecified complications: Secondary | ICD-10-CM | POA: Diagnosis not present

## 2024-05-24 LAB — POCT GLYCOSYLATED HEMOGLOBIN (HGB A1C): HbA1c, POC (controlled diabetic range): 8.4 % — AB (ref 0.0–7.0)

## 2024-05-24 MED ORDER — DAPAGLIFLOZIN PRO-METFORMIN ER 10-1000 MG PO TB24: 1.0000 | ORAL_TABLET | Freq: Every day | ORAL | 3 refills | Status: AC

## 2024-05-24 NOTE — Progress Notes (Signed)
 05/24/2024 Name: Rodney Kane MRN: [id number] DOB: [birth year]  Chief Complaint  Patient presents with   Diabetes   Congestive Heart Failure   Hypertension    Rodney Kane is a 48 y.o. year old male who was referred for medication management by their primary care provider, [name], NP. They presented for face-to-face appointment today.   They were referred to the pharmacist by their PCP for assistance in managing diabetes. PMH includes HF (EF 45-50% in Aug 2023, LV global hypokinesis, G1DD), atrial fibrillation, HTN, CVA (2021), hx of VTE (2022), T2DM. Patient is following with complex care management for transportation needs, food insecurity, financial difficulties related to housing, and applying for disability. 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   Subjective:  Patient was initially outreached on 08/25/23 by Dorcas Solian, PharmD after patient had recently been seen in the ED 2/2 hyperglycemia and cellulitis. At in-person pharmacy appointment with me on 09/08/23, patient brought all his medication bottles and glucometer. Random BG were ranging 200-300s, with symptomatic hyperglycemia. Initiated insulin  glargine (Lantus ) 16 units daily and increased losartan  to 50 mg daily for elevated BP in the setting of HFrEF. Also restarted atorvastatin . At pharmacy f/u on 10/27/23, we initiated Ozempic , continued Lantus  and metformin , and stopped glipizide . Also recommended to start amlodipine  5 mg daily for elevated BP. Patient was seen by cardiology on 12/06/23. BP was still elevated and he was instructed to increase losartan  and metoprolol . He has also been reinitiated on a DOAC for stroke prevention given Afib dx. At pharmacy telephone appt on 12/08/23, patient reported post-prandial BG consistently > 200 mg/dL. He was having some GI upset with Ozempic , so we titrated Lantus  from 16 to 20 units daily. At last pharmacy telephone appt on 12/21/23 - patient reported having  one episode of feeling dizziness, feeling clammy, nausea, blurry vision when he was fishing and out in the sun for a while. He treated this with water and gatorade but reported that he continued to feel weak for a while. However, reported SMBG were all in the 200s. He was instructed to continue his current regimen, and we set up an appointment to initiated FL3+ CGM. His GI AE had improved after taking Ozempic  0.5 mg for a longer period of time.   He was engaged by pharmacy via telephone on 01/01/24 at which time I assisted him in applying the FL3+ over the phone. A1C had improved to 10%. He was instructed to increase Ozempic  to 1 mg weekly. We were scheduled for f/u on 02/17/24 but I was not able to reach him. Intermittent review of his CGM report demonstrated that BG were improving after increasing Ozempic , but still slightly above goal. He has been working with SW on applying for disability. At pharmacy call on 04/05/24, patient was doing well. Instructed to increase Ozempic  to 2 mg weekly.  Today patient is doing well. Blood sugars have been well controlled over the past 2 weeks. He is tolerating Ozempic  well.  Care Team: Primary Care Provider: [name], NP ; Next Scheduled Visit: 07/15/24 Cardiology: Dr. Anner - needs to be scheduled ~Oct 2025 (overdue)  Medication Access/Adherence  Current Pharmacy:  [facility] [address] [place] - [address] [place] [address] Phone: [phone] Fax: [phone]   Patient reports affordability concerns with their medications: Yes  Patient reports access/transportation concerns to their pharmacy: No  Patient reports adherence concerns with their medications:  Yes  -  taking Eliquis  10 mg once daily (2 tabs at once) and only taking metformin  XR 1000 mg once daily  Patient has been connected with Medicare Complex Care Management and SW for assistance applying for disability given residual deficits with  speech.   Diabetes:  Current medications: metformin  1000 XR BID (only taking 1000 mg once daily) , Lantus  (insulin  glargine) 20 units daily (~1PM), Ozempic  2 mg once weekly (Wednesdays - tomorrow will be the 3rd week of 2 mg dose)  Medications tried in the past: Novolog , sitagliptin , and linagliptin  in the past  Has some GI AE on first or second day after Ozempic , mostly diarrhea, but tolerable.   Current glucose readings: Using FL3+ CGM (Accu Chek Guide as backup)      Patient reports s/sx of hypoglycemia occasionally - some nausea, nervous, jittery. Appears to have occasional overnight or AM hypolgycemia on AGP. Denies polydipsia, polyphagia, nocturia, blurred vision.   Current meal patterns: Typically 1-2 meals/day around 12-1PM and then in the evening 6-8PM. Reports that some days he does not eat any meals, but instead snacks throughout the day (like multiple granola bars) - Reports that it is difficult for him to cut out sweets. He will go a couple days without eating sweets, but then will eat a lot at one time.  - Lunch: not consistent, varies - Supper: varies - Snacks: lance crackers, strawberry yogurt - Drinks water all throughout the day, has switched to diet/sugar-free sodas, koolaid punch zero sugar, minutemaid zero sugar drinks  Current physical activity: occasional walking, has a shake plate at home (core exercise). No other strength training reported.   Current medication access support: insurance - Medicaid  Hypertension:  Current medications: losartan  50 mg PO daily, metoprolol  succinate 100 mg daily, amlodipine  10 mg daily  Medications previously tried: furosemide  40 mg daily PRN (he was instructed to stop this at pharmacy telephone appt on 08/25/23 as it was an old Rx), hydralazine , losartan  100 mg (elevated potassium)  Patient reported that he just ordered a new BP cuff - says he has been checking at home, but does not remember values Current blood pressure  readings readings: n/a, thinks they have been controlled.  Objective:  BP Readings from Last 3 Encounters:  05/24/24 129/89  04/15/24 139/82  01/14/24 130/89   Lab Results  Component Value Date   HGBA1C 8.4 (A) 05/24/2024   HGBA1C 10.0 (H) 12/31/2023   HGBA1C 15.0 (A) 08/21/2023   eGFR > 60 mL/min, UACR 10/01/23: 4 mg/g  Lab Results  Component Value Date   CREATININE 1.40 (H) 04/15/2024   BUN 10 04/15/2024   NA 141 04/15/2024   K 5.1 04/15/2024   CL 101 04/15/2024   CO2 24 04/15/2024    Lab Results  Component Value Date   CHOL 146 12/31/2023   HDL 51 12/31/2023   LDLCALC 82 12/31/2023   TRIG 64 12/31/2023   CHOLHDL 2.9 12/31/2023    Medications Reviewed Today     Reviewed by Brinda Lorain SQUIBB, RPH (Pharmacist) on 05/24/24 at 1301  Med List Status: <None>   Medication Order Taking? Sig Documenting Provider Last Dose Status Informant  amLODipine  (NORVASC ) 10 MG tablet [id number] Yes Take 1 tablet (10 mg total) by mouth daily. Paseda, Folashade R, FNP  Active   apixaban  (ELIQUIS ) 5 MG TABS tablet [id number] Yes Take 1 tablet (5 mg total) by mouth 2 (two) times daily.  Patient taking differently: Take 1 tablet (5 mg total) by mouth 2 (two) times daily.  [name], NP  Active   atorvastatin  (LIPITOR ) 40 MG tablet [id number] Yes Take 1 tablet (40 mg total) by mouth daily. [name], NP  Active   Blood Glucose Monitoring Suppl DEVI [id number]  1 each by Does not apply route in the morning, at noon, and at bedtime. May substitute to any manufacturer covered by patient's insurance. [name], NP  Active            Med Note DELSA LORAIN SHAUNNA Pablo Dec 21, 2023  2:46 PM)    Continuous Glucose Sensor (FREESTYLE LIBRE 3 PLUS SENSOR) [place] [id number] Yes Change sensor every 15 days. [name], NP  Active   Dapagliflozin Pro-metFORMIN  ER (XIGDUO XR) 04-999 MG TB24 [id number] Yes Take 1 tablet by mouth daily. [name], NP  Active   diclofenac  (VOLTAREN ) 75 MG EC  tablet [id number] Yes Take 1 tablet (75 mg total) by mouth 2 (two) times daily. [name], NP  Active   Insulin  Pen Needle (PEN NEEDLES) 32G X 4 MM MISC [id number]  Use as directed to inject insulin  once daily Nichols, Tonya S, NP  Active   LANTUS  SOLOSTAR 100 UNIT/ML Solostar Pen [id number] Yes Inject 20 Units into the skin daily. May increase up to 30 units daily as instructed by your provider. [name], NP  Active   losartan  (COZAAR ) 50 MG tablet [id number] Yes Take 1 tablet (50 mg total) by mouth daily. Paseda, Folashade R, FNP  Active   Patient taking differently:   Discontinued 05/24/24 1026 (Change in therapy)   metoprolol  succinate (TOPROL -XL) 100 MG 24 hr tablet [id number] Yes Take 1 tablet (100 mg total) by mouth daily. Take with or immediately following a meal. Anner Alm ORN, MD  Active     Discontinued 05/24/24 1025 (Dose change)   Semaglutide , 2 MG/DOSE, (OZEMPIC , 2 MG/DOSE,) 8 MG/3ML SOPN [id number] Yes Inject 2 mg into the skin once a week. [name], NP  Active   tadalafil (CIALIS) 20 MG tablet [id number]  Take 20 mg by mouth daily as needed. [provider]  Active             Assessment/Plan:   Diabetes: - Currently controlled with TIR of 93% above goal < 70%. A1C today still elevated at 8.4% above goal < 7%, however expect to improve after patient has been on Ozempic  2 mg weekly for a longer period of time. Will plan to add on SGLT2i with improvement in glycemic control, as he has multiple indications including DM, CKD, and hx of HFrEF.   - UACR March 2025 - 4 mg/g - Reviewed long term cardiovascular and renal outcomes of uncontrolled blood sugar - Reviewed goal A1c, goal fasting, and goal 2 hour post prandial glucose - Reviewed dietary modifications including increasing protein intake, avoiding carbohydrate heavy foods. Maintaining water intake and avoiding sugar-sweetened beverages. Educated extensively on lifestyle interventions to avoid GI  upset with GLP-1RA.  - Reviewed lifestyle modifications including: increasing weight bearing exercise to avoid muscle loss with GLP-1RA.  - Recommend to continue Ozempic  (semaglutide ) 2 mg injected into the skin once weekly - Recommend to decrease insulin  glargine (Lantus ) to 16 units injected into the skin daily - Recommend to STOP metformin  and START Xigduo XR (dapagliflozin-metformin ) 04-999 mg once daily - Patient denies personal or family history of multiple endocrine neoplasia type 2, medullary thyroid  cancer; personal history of pancreatitis or gallbladder disease. - Recommend to monitor BG continuously with FL3+  CGM.  - Next A1C due 08/25/23   Hypertension/HFrEF: - Currently uncontrolled above goal < 130/80 mmHg. Patient continues to have tachycardia and reports heart racing. He is due for cardiology follow-up. Unable to titrate ARB as he had hyperkalemia with losartan  100 mg daily. K decreased from 5.3 mEq/L to 5.1 mEq/L since decreasing to 50 mg daily. Addition of SGLT2i may stabilize potassium, and allow for transition from losartan  to Entresto which may achieve more potent blood pressure lowering. Cannot add spironolactone  at this time with potassium of 5.1 mEq/L.  - Reviewed long term cardiovascular and renal outcomes of uncontrolled blood pressure - Reviewed appropriate blood pressure monitoring technique and reviewed goal blood pressure. Recommended to check home blood pressure and heart rate a couple times per week.  - Recommend to continue losartan  to 50 mg daily  - Recommend to continue amlodipine  to 10 mg daily  - Recommend to continue metoprolol  100 mg daily - Recommend to start SGLT2i in Xigduo (dapagliflozin-metformin ) as above   Hyperlipidemia/ASCVD Risk Reduction: - Currently uncontrolled with last LDL-C of 82 mg/dL above goal < 55 mg/dL given premature ASCVD, however - he has only been taking high-intensity statin since 12/10/23. He would likely be a good candidate for  Repatha in the future, but did not discuss this today.  - Reviewed long term complications of uncontrolled cholesterol - Recommend to continue atorvastatin  40 mg PO daily  - Repeat lipid panel ~ Dec 2025   Written instructions provider. Patient verbalized understanding of plan.    Follow Up Plan: PCP 07/15/24, Pharmacist telephone 06/24/24   Lorain Baseman, PharmD [facility] [phone]

## 2024-05-24 NOTE — Patient Instructions (Addendum)
 It was nice to see you today! Please schedule an appointment with cardiology. They wanted you to return around October 20th, 2025. You saw Dr. Anner last time. [phone].  Your goal blood sugar is 80-130 mg/dL before eating and less than 180 mg/dL after eating. Monitor blood sugars at home and keep a log (glucometer or piece of paper) to bring with you to your next visit.  Your goal blood pressure is less than 130/80 mmHg. Please keep a log if you are able  Medication Changes: STOP Metformin  and START Xigduo XR (dapagliflozin-metformin  XR) 04-999 mg once daily  Continue Ozempic  2 mg weekly  Decrease Lantus  to 16 units once daily  Start taking Eliquis  5 mg TWICE daily instead of both tablets at once. This is the only way it will work effectively to reduce your risk of stroke. If you are not able to remember it twice daily, we can switch to Xarelto .  Continue all other medication the same.   If you experience symptoms of a low blood sugar (dizzy, shaky, sweaty) check your blood sugar. If it is less than 70 mg/dL, you should eat or drink 15 grams of fast-acting carbohydrates like 4 glucose tablets, 1/2 cup of regular soda, or 1/2 cup of juice. Recheck your blood sugar after 15 minutes. If the level is still below 70 mg/dL repeat the process. If this occurs frequently, you should notify your healthcare provider.   Lifestyle Recommendations:  Aim for 150 minutes of moderate intensity exercise every week. This is any activity that elevates your heart rate and breathing rate, but still allows you to carry on a conversation. Exercising after meals can help prevent spikes in your blood sugar.  Diet Recommendations:   Try to eat 3 real meals using the healthy plate method and 1-2 snacks per day. Never go more than 4-5 hours while awake without eating. Eat breakfast within the first hour of getting up.     Carbohydrates include starch, sugar, and fiber. Sugar and starch raise blood glucose.   Starchy foods include bread, rice, pasta, potatoes, corn, cereal, grits, crackers, bagels, muffins, all baked foods Some fruits are higher in sugar, like grapes, watermelon, oranges, and most tropical fruits. Limit your serving size to 1/2 cup at a time.  Protein foods include meat, fish, poultry, eggs, dairy, and beans (although beans also provide carbohydrates).  Non-starchy vegetables do not impact your blood sugar very much. These include greens, broccoli, asparagus, carrots, cauliflower, cucumber, mushrooms, peppers, yellow squash, zucchini squash, tomato, to name a few!  Avoid all sugary beverages. Stay hydrated with water throughout the day. Sparkling/flavored water, unsweet tea, black coffee, and zero-sugar or diet drinks will not impact your blood sugar, but they should not be used as your only source of hydration!  You can find more information at https://diabetes.org/food-nutrition/eating-healthy   Lorain Baseman, PharmD [facility] [phone]

## 2024-05-24 NOTE — Telephone Encounter (Signed)
 Pharmacy Patient Advocate Encounter  Received notification from [facility] MEDICAID that Prior Authorization for XIGDUO has been APPROVED from 05/24/2024 to 05/24/2025   PA #/Case ID/Reference #: [id number]

## 2024-06-08 ENCOUNTER — Ambulatory Visit: Attending: Cardiology

## 2024-06-08 ENCOUNTER — Other Ambulatory Visit: Payer: Self-pay | Admitting: *Deleted

## 2024-06-08 ENCOUNTER — Encounter: Payer: Self-pay | Admitting: Cardiology

## 2024-06-08 ENCOUNTER — Ambulatory Visit: Attending: Cardiology | Admitting: Cardiology

## 2024-06-08 VITALS — BP 138/88 | Ht 73.0 in | Wt 340.0 lb

## 2024-06-08 DIAGNOSIS — Z79899 Other long term (current) drug therapy: Secondary | ICD-10-CM

## 2024-06-08 DIAGNOSIS — I480 Paroxysmal atrial fibrillation: Secondary | ICD-10-CM

## 2024-06-08 DIAGNOSIS — I5022 Chronic systolic (congestive) heart failure: Secondary | ICD-10-CM

## 2024-06-08 DIAGNOSIS — R000 Tachycardia, unspecified: Secondary | ICD-10-CM

## 2024-06-08 DIAGNOSIS — I513 Intracardiac thrombosis, not elsewhere classified: Secondary | ICD-10-CM

## 2024-06-08 DIAGNOSIS — I428 Other cardiomyopathies: Secondary | ICD-10-CM | POA: Diagnosis not present

## 2024-06-08 DIAGNOSIS — E785 Hyperlipidemia, unspecified: Secondary | ICD-10-CM | POA: Diagnosis not present

## 2024-06-08 DIAGNOSIS — D6869 Other thrombophilia: Secondary | ICD-10-CM | POA: Diagnosis not present

## 2024-06-08 DIAGNOSIS — E1169 Type 2 diabetes mellitus with other specified complication: Secondary | ICD-10-CM

## 2024-06-08 DIAGNOSIS — I10 Essential (primary) hypertension: Secondary | ICD-10-CM | POA: Diagnosis not present

## 2024-06-08 MED ORDER — CHLORTHALIDONE 25 MG PO TABS: 25.0000 mg | ORAL_TABLET | Freq: Every day | ORAL | 6 refills | Status: AC

## 2024-06-08 NOTE — Progress Notes (Signed)
 Cardiology Office Note:  .   Date:  06/18/2024  ID:  Rodney Kane, DOB [birth year], MRN [id number] PCP: Rodney Bascom RAMAN, NP  [facility] Providers Cardiologist:  Rodney Clay, MD     Chief Complaint  Patient presents with   Follow-up    67-month follow-up.  Doing fairly well.   Congestive Heart Failure    NYHA class II symptoms   Atrial Fibrillation    Short little bursts of tachycardia lasting 20 to 30 minutes.    Patient Profile: .     Rodney Kane is a morbidly obese 48 y.o. male with a PMH noted below who presents here for 52-month follow-up at the request of Rodney Bascom RAMAN, NP.  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 Follow evaluation in December 2021 his EF is 20 to 25% with global hypokinesis. At this point he had significant lower extremity edema but did respond to high-dose Lasix  but the weight came right back again.  He noted orthopnea and PND.  He had not been taking Lasix  routinely nor was he taking spironolactone .  I increased losartan  and then had a quick course of higher dose Lasix  My first clinic visit with this gentleman was in February 2022 having been lost to follow-up since his hospitalization initially in December 2021 (and I had not seen him since 2018).     Rodney Kane was last seen on Dec 07, 2023 has a very delayed follow-up.  Prior to that he had not been seen since November 2022.:  Increase losartan  and Toprol  to 100 mg daily.  Hope was to potentially convert to ARNI.  Considered adding spironolactone  next.  He was also on  semaglutide    Subjective  Discussed the use of AI scribe software for clinical note transcription with the patient, who gave verbal consent to proceed.  History of Present Illness Rodney Kane is a 48 year old male with atrial fibrillation and hypertension, and nonischemic cardiomyopathy who presents for follow-up regarding heart rate and blood pressure management. He is accompanied by a friend's child whom he is babysitting.  He experiences episodes of his chest racing when trying to sleep, lasting 20 to 30 minutes. No chest pain, pressure, or tightness. He does not wake up due to breathing difficulties. No swelling in his legs and does not require sitting up at night to sleep. He uses two pillows and denies waking up short of breath.  He is currently taking Eliquis  5 mg twice daily for atrial fibrillation and a history of a blood clot in the heart.  For hypertension, he is on amlodipine  10 mg, Toprol  XL 100 mg, and losartan  50 mg, which was recently reduced from 100 mg. He notes that the change was made by a nurse over the phone. No dizziness, lightheadedness, or pain in his calves or thighs when walking.  Regarding diabetes management, he is on a combination of empagliflozin and metformin , and his insulin  dose has been reduced to 16 units. He continues to take Ozempic . His last A1c was 8.4%.  He denies any recent swelling, except when sleeping in a chair  while visiting others. He also denies any dizziness, wooziness, or lightheadedness. No blood in stools, urine, or epistaxis, and no sores on his feet, with a previous sore on his leg now completely healed.  Cardiovascular ROS: no chest pain or dyspnea on exertion positive for - tachycardia spells when lying down. - also a couple times/day; swelling controlled negative for - loss of consciousness, orthopnea, paroxysmal nocturnal dyspnea, shortness of breath, or syncope, TIA/amaurosis fugax, claudication  ROS:  Review of Systems -  Negative except noted above    Objective   Medications: Losartan  50 mg daily, Toprol -XL 100 mg daily, amlodipine  10 mg daily. Apixaban  5 mg twice daily Atorvastatin  41 daily Dapagliflozin  Pro metformin  ER (Xigduo ) 04-999 mg daily; Lantus  20 units daily PRN Cialis 20 mg  As needed Voltaren  75 mg twice daily  Studies Reviewed: SABRA   EKG Interpretation Date/Time:  Wednesday June 08 2024 10:10:29 EST Ventricular Rate:  96 PR Interval:  176 QRS Duration:  94 QT Interval:  368 QTC Calculation: 464 R Axis:   -25  Text Interpretation: Normal sinus rhythm Septal infarct (cited on or before 14-Feb-2023) When compared with ECG of 07-Dec-2023 15:57, No significant change was found Confirmed by Anner Lenis ([id number]) on 06/08/2024 10:36:52 AM    Results LABS Total cholesterol: 146 (12/2023) HDL: 51 (12/2023) LDL: 82 (12/2023) Triglycerides: 64 (12/2023) A1c: 8.4 (05/24/2024) Creatinine: 1.4 (04/15/2024) Potassium: 5.1 (04/15/2024)  DIAGNOSTIC ECG: Normal ECHO 02/19/2022: EF improved to 45-50%.  Mildly decreased function with global apical hypokinesis.  GR 1 DD.  Normal R RV with normal RV P/PAP and RAP.  Trivial MR but otherwise normal valves.  No LV thrombus Echo 06/10/2021: EF 30 to 35% with global hypokinesis.  Moderately dilated LV.  Mild LVH.  Indeterminate diastolic pressures.  Moderate LA dilation.  Mildly enlarged RV.  Thoracic aorta measured 41 mm.  LV apical thrombus not noted Right Left Heart Cath 01/05/2017: Angiographic normal coronary arteries.  Essentially normal right heart cath pressures.  Risk Assessment/Calculations:    CHA2DS2-VASc Score = 6   This indicates a 9.7% annual risk of stroke. The patient's score is based upon: CHF History: 1 HTN History: 1 Diabetes History: 1 Stroke History: 2 Vascular Disease History: 1 Age Score: 0 Gender Score: 0          Physical Exam:   VS:  BP 138/88 (BP Location: Left Arm, Patient Position: Sitting, Cuff Size:  Large)   Ht 6' 1 (1.854 m)   Wt (!) 340 lb (154.2 kg)   SpO2 99%   BMI 44.86 kg/m    Wt Readings from Last 3 Encounters:  06/08/24 (!) 340 lb (154.2 kg)  05/24/24 (!) 340 lb 9.6 oz (154.5 kg)  04/15/24 (!) 341 lb (154.7 kg)    GEN: Well nourished, well groomed; in no acute distress; facial kelioid NECK: No JVD; No carotid bruits CARDIAC:distant heart sounds; Normal S1, S2; RRR with occasional ectopy, no murmurs, rubs, gallops RESPIRATORY:  Clear to auscultation without rales, wheezing or rhonchi ; nonlabored, good air movement. ABDOMEN: Soft, non-tender, non-distended EXTREMITIES:  No edema; No deformity      ASSESSMENT AND PLAN: .    Problem List Items Addressed This Visit       Cardiology Problems   Chronic heart failure with mildly reduced ejection fraction (HFmrEF, 41-49%) (HCC) (Chronic)   Well-controlled with stable EKG and no symptoms.  NYHA class I-IIa. Euvolemic on exam.  Has not required diuretic. GDMT: On losartan  50 mg daily (previously had  issues affording Entresto) Toprol  XL 100 mg daily On dapagliflozin  10 mg daily and Ozempic  2 mg weekly Not on spironolactone  because of hyperkalemia issues. For additional BP control we will add chlorthalidone  25 mg daily.      Relevant Medications   chlorthalidone  (HYGROTON ) 25 MG tablet   Other Relevant Orders   EKG 12-Lead (Completed)   Comp Met (CMET)   Essential hypertension (Chronic)   Managed with amlodipine , metoprolol , and losartan . Losartan  dose reduced due to hyperkalemia, making spironolactone  less valid as a choice..  Will add chlorthalidone  25 mg daily for standing diuretic - Continue amlodipine  10 mg daily and Toprol  100 mg daily - Added chlorthalidone  25 mg in the morning with losartan  which was reduced to 50 mg daily. - Check blood work in two weeks to monitor potassium levels.       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  Also has history of LV thrombus. Continue Eliquis  5 mg twice daily Okay to hold Eliquis  2 to 3 days preop for surgeries or procedures.      Relevant Medications   chlorthalidone  (HYGROTON ) 25 MG tablet   Other Relevant Orders   EKG 12-Lead (Completed)   Hyperlipidemia associated with type 2 diabetes mellitus (HCC) (Chronic)   Most recent lipid panel showed LDL of 82 on 40 mg atorvastatin .  In the absence of any active CAD, this is reasonably at goal. - Continue atorvastatin  40 daily.  A1c at 8.4%. Current regimen includes semaglutide , empagliflozin/metformin , and reduced Lantus  dose. - Continue current diabetes management regimen-managed by PCP.      Relevant Medications   chlorthalidone  (HYGROTON ) 25 MG tablet   Other Relevant Orders   EKG 12-Lead (Completed)   LV (left ventricular) mural thrombus (Chronic)   Diagnosed in setting of exacerbation of CHF.  Now remains on Eliquis  for combination of LV thrombus and A-fib.      Relevant Medications   chlorthalidone  (HYGROTON ) 25 MG tablet   Other Relevant Orders   EKG 12-Lead (Completed)   Nonischemic cardiomyopathy (HCC) - Primary (Chronic)   Unclear the true etiology, but previously no notable CAD on cath. Likely etiology is hypertensive heart disease.      Relevant Medications   chlorthalidone  (HYGROTON ) 25 MG tablet   Other Relevant Orders   EKG 12-Lead (Completed)   Comp Met (CMET)   Paroxysmal atrial fibrillation (HCC) (Chronic)   Paroxysmal atrial fibrillation and paroxysmal tachycardia (palpitations) Improved palpitations after metoprolol  increase. EKG unchanged. Possible short bursts of atrial fibrillation. - Ordered 14-day Holter monitor to assess for atrial fibrillation episodes. - Continue metoprolol  succinate 100 mg daily - Continue Eliquis  5 mg twice daily      Relevant Medications   chlorthalidone  (HYGROTON ) 25 MG tablet   Other  Relevant Orders   EKG 12-Lead (Completed)   LONG TERM MONITOR (3-14 DAYS)   Other Visit Diagnoses       Increased heart rate       Relevant Orders   EKG 12-Lead (Completed)   LONG TERM MONITOR (3-14 DAYS)     Medication management       Relevant Orders   Comp Met (CMET)              Follow-Up: Return for Routine Follow-up after testing ~ 1-2 months, Follow-up with APP, 6 month follow-up with me.  I spent 43 minutes in the care  of Keeon Corteze Lobos today including reviewing outside labs from PCP (1 minute), reviewing studies (reviewed cath report and previous echo report-4 minutes), face to face time discussing treatment options (22 minutes), reviewing records from PCPs note and previous clinic notes (4 minutes), 11 minutes dictating, and documenting in the encounter.      Signed, Rodney MICAEL Clay, MD, MS Rodney Kane, M.D., M.S. Interventional Cardiologist  [facility] Pager # [phone]

## 2024-06-08 NOTE — Patient Instructions (Signed)
 Medication Instructions:  Your physician has recommended you make the following change in your medication:  START Chlorthalidone 25 mg every morning  *If you need a refill on your cardiac medications before your next appointment, please call your pharmacy*  Lab Work: Your physician recommends that you return for lab work in: 2 weeks for CMET  If you have any lab test that is abnormal or we need to change your treatment, we will call you to review the results.  Testing/Procedures:                           GEOFFRY HEWS- Long Term Monitor Instructions  Your physician has requested you wear a ZIO patch monitor for 14 days.  This is a single patch monitor. [employer] supplies one patch monitor per enrollment. Additional stickers are not available. Please do not apply patch if you will be having a Nuclear Stress Test,  Echocardiogram, Cardiac CT, MRI, or Chest Xray during the period you would be wearing the  monitor. The patch cannot be worn during these tests. You cannot remove and re-apply the  ZIO XT patch monitor.  Your ZIO patch monitor will be mailed 3 day USPS to your address on file. It may take 3-5 days  to receive your monitor after you have been enrolled.  Once you have received your monitor, please review the enclosed instructions. Your monitor  has already been registered assigning a specific monitor serial # to you.  Billing and Patient Assistance Program Information  We have supplied [employer] with any of your insurance information on file for billing purposes. [employer] offers a sliding scale Patient Assistance Program for patients that do not have  insurance, or whose insurance does not completely cover the cost of the ZIO monitor.  You must apply for the Patient Assistance Program to qualify for this discounted rate.  To apply, please call [employer] at [phone], select option 4, select option 2, ask to apply for  Patient Assistance Program. Meredeth will ask your household income,  and how many people  are in your household. They will quote your out-of-pocket cost based on that information.  [employer] will also be able to set up a 72-month, interest-free payment plan if needed.  Applying the monitor   Shave hair from upper left chest.  Hold abrader disc by orange tab. Rub abrader in 40 strokes over the upper left chest as  indicated in your monitor instructions.  Clean area with 4 enclosed alcohol pads. Let dry.  Apply patch as indicated in monitor instructions. Patch will be placed under collarbone on left  side of chest with arrow pointing upward.  Rub patch adhesive wings for 2 minutes. Remove white label marked 1. Remove the white  label marked 2. Rub patch adhesive wings for 2 additional minutes.  While looking in a mirror, press and release button in center of patch. A small green light will  flash 3-4 times. This will be your only indicator that the monitor has been turned on.  Do not shower for the first 24 hours. You may shower after the first 24 hours.  Press the button if you feel a symptom. You will hear a small click. Record Date, Time and  Symptom in the Patient Logbook.  When you are ready to remove the patch, follow instructions on the last 2 pages of Patient  Logbook. Stick patch monitor onto the last page of Patient Logbook.  Place Patient Logbook in  the blue and white box. Use locking tab on box and tape box closed  securely. The blue and white box has prepaid postage on it. Please place it in the mailbox as  soon as possible. Your physician should have your test results approximately 7 days after the  monitor has been mailed back to [facility].  Call [facility] Customer Care at [phone] if you have questions regarding  your ZIO XT patch monitor. Call them immediately if you see an orange light blinking on your  monitor.  If your monitor falls off in less than 4 days, contact our Monitor department at [phone].  If your monitor  becomes loose or falls off after 4 days call [employer] at [phone] for  suggestions on securing your monitor   Follow-Up: At [facility], you and your health needs are our priority.  As part of our continuing mission to provide you with exceptional heart care, our providers are all part of one team.  This team includes your primary Cardiologist (physician) and Advanced Practice Providers or APPs (Physician Assistants and Nurse Practitioners) who all work together to provide you with the care you need, when you need it.  Your next appointment:   January 2026  Provider:   One of our Advanced Practice Providers (APPs): Morse Clause, PA-C  Lamarr Satterfield, NP Miriam Shams, NP  Olivia Pavy, PA-C [name], NP  Leontine Salen, PA-C Orren Fabry, PA-C  [place], PA-C Ernest Dick, NP  Damien Braver, NP Jon Hails, PA-C  Waddell Donath, PA-C    Dayna Dunn, PA-C  Scott Weaver, PA-C Lum Louis, NP Katlyn West, NP Callie Goodrich, PA-C  Xika Zhao, NP Sheng Haley, PA-C    Kathleen Johnson, PA-C   Then, Alm Clay, MD will plan to see you again in 8 month(s).     Thank you for choosing [facility]!!   [phone]

## 2024-06-08 NOTE — Progress Notes (Unsigned)
 Enrolled for [employer] to mail a ZIO XT long term holter monitor to the patients address on file.

## 2024-06-10 ENCOUNTER — Telehealth: Payer: Self-pay

## 2024-06-10 NOTE — Telephone Encounter (Signed)
 Patient walked in to check on paperwork. I asked patient when he dropped off paperwork and he said Wednesday. Advised patient they will call him sometime next week regarding his forms due to 7-10 day turn around time.

## 2024-06-13 ENCOUNTER — Telehealth: Payer: Self-pay

## 2024-06-13 ENCOUNTER — Other Ambulatory Visit: Payer: Self-pay

## 2024-06-13 NOTE — Patient Outreach (Signed)
 Complex Care Management   Visit Note  06/13/2024  Name:  Rodney Kane MRN: [id number] DOB: Jun 09, 1976  Situation: Referral received for Complex Care Management related to Diabetes with Complications I obtained verbal consent from Patient.  Visit completed with Patient  on the phone  Background:   Past Medical History:  Diagnosis Date   Coronavirus infection 06/2020   COVID 06/2020   Diabetes mellitus without complication (HCC)    Hemoglobin A1C greater than 9%, indicating poor diabetic control 12/2019   Hyperglycemia 12/2019   Hyperlipidemia 12/2019   Hypertension    Hypertensive urgency 12/2019   Noncompliance with medication regimen 12/2019   Nonischemic (hypertensive) dilated cardiomyopathy (HCC) 12/2016   EF has been 30 to 35%, recently lower.   Stroke [facility])    Urine ketones 12/2019   Vitamin D  deficiency 12/2019    Assessment: Patient Reported Symptoms:  Cognitive Cognitive Status: Able to follow simple commands, Alert and oriented to person, place, and time, Normal speech and language skills Cognitive/Intellectual Conditions Management [RPT]: Brain Injury Brain Injury: CAA (2021, 2022)   Health Maintenance Behaviors: Annual physical exam, Exercise Health Facilitated by: Pain control, Rest  Neurological Neurological Review of Symptoms: Not assessed    HEENT HEENT Symptoms Reported: Not assessed      Cardiovascular Cardiovascular Symptoms Reported: No symptoms reported Does patient have uncontrolled Hypertension?: Yes Is patient checking Blood Pressure at home?: No Cardiovascular Management Strategies: Medication therapy, Routine screening, Weight management Do You Have a Working Readable Scale?: Yes Cardiovascular Comment: Note per chart review patient has had follow-up with cardiologist as advised. Patient reports he has started Chlorthalidone  as ordered. He states he is waiting for Zio monitor to arrive in the mail. Patient reports one episode of  palpitations since previous visit. Patient reports he has not been weighing daily or taking his BP at home  Respiratory Respiratory Symptoms Reported: Not assesed    Endocrine Endocrine Symptoms Reported: No symptoms reported Is patient diabetic?: Yes Is patient checking blood sugars at home?: Yes List most recent blood sugar readings, include date and time of day: FreeStyle Libre, lower 100's per patient Endocrine Comment: Confirmed patient has made medication changes as advised by pharmacist 05/24/24  Gastrointestinal Gastrointestinal Symptoms Reported: Constipation Additional Gastrointestinal Details: Patient continues to report fluctuating appetite due to Ozempic  use. He reports having a BM about twice a week. He does feel that his BMs have become less frequent. Denies nausea, vomiting, or abdominal pain. Reports occasional straining to have a BM. He reports he does not take stool softeners or laxatives. Reviewed dietary changes such as increasing water intake, foods with fiber, prune juice/lemon juice. Reviewed use of stool softeners or laxatives as needed. Gastrointestinal Management Strategies: Diet modification, Medication therapy    Genitourinary Genitourinary Symptoms Reported: Frequency, Other Other Genitourinary Symptoms: Patient reports issues related to erectile dysfunction, and is curious if this could be related to testosterone. He reports is also experiencing fatigue, hair loss. Message sent to PCP Additional Genitourinary Details: Patient reports increased frequency due to new medication (chlorthalidone ) Genitourinary Management Strategies: Coping strategies  Integumentary Integumentary Symptoms Reported: Not assessed    Musculoskeletal Musculoskelatal Symptoms Reviewed: Joint pain Additional Musculoskeletal Details: Patient reports joint/shoulder pain is decreased with diclofenac . He also feels as though he has increased mobility in that shoulder. Patient reports he still has not  heard from orthopedic surgeon regarding referral placed by PCP. Call placed to PCP office to check on status of referral Musculoskeletal Management Strategies: Medication therapy, Routine screening,  Coping strategies Falls in the past year?: No Number of falls in past year: 1 or less Was there an injury with Fall?: No Fall Risk Category Calculator: 0 Patient Fall Risk Level: Low Fall Risk Patient at Risk for Falls Due to: No Fall Risks Fall risk Follow up: Falls evaluation completed, Education provided, Falls prevention discussed  Psychosocial Psychosocial Symptoms Reported: Not assessed          06/13/2024    PHQ2-9 Depression Screening   Little interest or pleasure in doing things    Feeling down, depressed, or hopeless    PHQ-2 - Total Score    Trouble falling or staying asleep, or sleeping too much    Feeling tired or having little energy    Poor appetite or overeating     Feeling bad about yourself - or that you are a failure or have let yourself or your family down    Trouble concentrating on things, such as reading the newspaper or watching television    Moving or speaking so slowly that other people could have noticed.  Or the opposite - being so fidgety or restless that you have been moving around a lot more than usual    Thoughts that you would be better off dead, or hurting yourself in some way    PHQ2-9 Total Score    If you checked off any problems, how difficult have these problems made it for you to do your work, take care of things at home, or get along with other people    Depression Interventions/Treatment      There were no vitals filed for this visit. Pain Scale: 0-10 Pain Score: 2  Pain Type: Acute pain Pain Location: Shoulder Pain Orientation: Left  Medications Reviewed Today     Reviewed by Arno Rosaline SQUIBB, RN (Registered Nurse) on 06/13/24 at 1333  Med List Status: <None>   Medication Order Taking? Sig Documenting Provider Last Dose Status  Informant  amLODipine  (NORVASC ) 10 MG tablet [id number]  Take 1 tablet (10 mg total) by mouth daily. Paseda, Folashade R, FNP  Active   apixaban  (ELIQUIS ) 5 MG TABS tablet [id number]  Take 1 tablet (5 mg total) by mouth 2 (two) times daily.  Patient taking differently: Take 1 tablet (5 mg total) by mouth 2 (two) times daily.   [name], NP  Active   atorvastatin  (LIPITOR ) 40 MG tablet [id number]  Take 1 tablet (40 mg total) by mouth daily. [name], NP  Active   Blood Glucose Monitoring Suppl DEVI [id number]  1 each by Does not apply route in the morning, at noon, and at bedtime. May substitute to any manufacturer covered by patient's insurance. [name], NP  Active            Med Note DELSA LORAIN SQUIBB Pablo Dec 21, 2023  2:46 PM)    chlorthalidone  (HYGROTON ) 25 MG tablet [id number] Yes Take 1 tablet (25 mg total) by mouth daily. Anner Alm ORN, MD  Active   Continuous Glucose Sensor (FREESTYLE LIBRE 3 PLUS SENSOR) [place] [id number]  Change sensor every 15 days. [name], NP  Active   Dapagliflozin  Pro-metFORMIN  ER (XIGDUO  XR) 04-999 MG TB24 [id number]  Take 1 tablet by mouth daily. [name], NP  Active   diclofenac  (VOLTAREN ) 75 MG EC tablet [id number]  Take 1 tablet (75 mg total) by mouth 2 (two) times daily. [name], NP  Active   Insulin  Pen Needle (PEN  NEEDLES) 32G X 4 MM MISC [id number]  Use as directed to inject insulin  once daily Nichols, Tonya S, NP  Active   LANTUS  SOLOSTAR 100 UNIT/ML Solostar Pen [id number]  Inject 20 Units into the skin daily. May increase up to 30 units daily as instructed by your provider. [name], NP  Active   losartan  (COZAAR ) 50 MG tablet [id number]  Take 1 tablet (50 mg total) by mouth daily. Paseda, Folashade R, FNP  Active   metoprolol  succinate (TOPROL -XL) 100 MG 24 hr tablet [id number]  Take 1 tablet (100 mg total) by mouth daily. Take with or immediately following a meal. Anner Alm ORN, MD  Active   Semaglutide , 2  MG/DOSE, (OZEMPIC , 2 MG/DOSE,) 8 MG/3ML SOPN [id number]  Inject 2 mg into the skin once a week. [name], NP  Active   tadalafil (CIALIS) 20 MG tablet [id number]  Take 20 mg by mouth daily as needed. [provider]  Active             Recommendation:   Lab requests: testosterone per patient request (message sent to PCP) Continue Current Plan of Care Follow-up on status of orthopedic surgeon referral  Follow Up Plan:   Telephone follow up appointment date/time:  07/11/24 at 1:30 PM  [name], RN MSN [place]  [facility] RN Care Manager Direct Dial: [phone]  Fax: [phone]

## 2024-06-13 NOTE — Telephone Encounter (Signed)
 Copied from CRM #[id number]. Topic: Referral - Question >> Jun 13, 2024  2:00 PM Darshell M wrote: Reason for CRM: Rosaline [phone]), Nurse Case Manager, calling to follow up on referral from 04/15/2024 to orthopedic surgery. Patient has not been contacted to schedule. Referral still shows pending. Rosaline requesting information for where the patient is being referred to.

## 2024-06-13 NOTE — Patient Instructions (Signed)
 Visit Information  Mr. Mcnealy was given information about Medicaid Managed Care team care coordination services as a part of their [employer] Medicaid benefit.   If you would like to schedule transportation through your [facility] plan, please call the following number at least 2 days in advance of your appointment: [phone]  If you are experiencing a behavioral health crisis, call the [employer] [place]  Behavioral Health Crisis Line at [phone]). The line is available 24 hours a day, seven days a week.    Please see education materials related to Fiber content in foods provided by MyChart link.  Care plan and visit instructions communicated with the patient verbally today. Patient agrees to receive a copy in MyChart. Active MyChart status and patient understanding of how to access instructions and care plan via MyChart confirmed with patient.     RN Care Manager will follow-up on orthopedic surgeon referral, message sent to PCP regarding request for testosterone labs Telephone follow up appointment with Managed Medicaid care management team member scheduled for: 07/11/24 at 1:30 PM  [name], RN MSN [place]  [facility] RN Care Manager Direct Dial: [phone]  Fax: [phone]   Following is a copy of your plan of care:  There are no care plans that you recently modified to display for this patient.   Fiber Content in Foods Fiber is found in plant foods, such as fruits, vegetables, whole grains, nuts, seeds, and beans. If you have certain conditions, you may need to eat a high-fiber diet or a low-fiber diet. Your health care provider will tell you how much fiber you need. If you have problems or questions, contact your provider or dietitian. What foods are high in fiber?  Foods high in fiber have 4g of fiber or more per serving. Fruits Blackberries or raspberries (fresh) --  cup  (75 g) has 4 g of fiber. Pear (fresh) -- 1 medium (180 g) has 5.5 g of fiber. Prunes (dried) -- 6 to 8 pieces (57-76 g) has 5 g of fiber. Apple with skin -- 1 medium (182 g) has 4.8 g of fiber. Guava -- 1 cup (128 g) has 8.9 g of fiber. Vegetables Peas (frozen) --  cup (80 g) has 4.4 g of fiber. Potato with skin (baked) -- 1 medium (173 g) has 4 g of fiber. Pumpkin (canned) --  cup (122 g) has 4 g of fiber. Sweet potato --  cup mashed (124 g) has 4 g of fiber. Winter squash -- 1 cup cooked (205 g) has 5.7 g of fiber. Grains Bran cereal --  cup (31 g) has 8.6 g of fiber. Bulgur (cooked) --  cup (70 g) has 4 g of fiber. Quinoa (cooked) -- 1 cup (185 g) has 5.2 g of fiber. Popcorn -- 3 cups (375 g) popped has 5.8 g of fiber. Spaghetti, whole wheat -- 1 cup (140 g) has 6 g of fiber. Oatmeal (cooked) -- 1 cup (234 g) has 4g of fiber. Meats and other proteins Pinto beans (cooked) --  cup (90 g) has 7.7 g of fiber. Lentils (cooked) --  cup (90 g) has 7.8 g of fiber. Kidney beans (canned) --  cup (92.5 g) has 5.7 g of fiber. Soybeans (canned, frozen, or fresh) --  cup (92.5 g) has 5.2 g of fiber. Baked beans, plain or vegetarian (canned) --  cup (130 g) has 5.2 g of fiber. Garbanzo beans or chickpeas (canned) --  cup (90 g) has  6.6 g of fiber. Black beans (cooked) --  cup (86 g) has 7.5 g of fiber. White beans or navy beans (cooked) --  cup (91 g) has 9.3 g of fiber. The items listed above may not be a complete list of foods with high fiber. Actual amounts of fiber may be different depending on processing. Contact a dietitian for more information. What foods are moderate in fiber?  Moderate fiber foods have 1-3 g of fiber per serving. Fruits Banana -- 1 medium (126 g) has 3.2 g of fiber. Melon -- 1 cup (155 g) has 1.4 g of fiber. Orange -- 1 small (154 g) has 3.7 g of fiber. Raisins --  cup (40 g) has 1.8 g of fiber. Applesauce, sweetened --  cup (125 g) has 1.5 g of  fiber. Blueberries (fresh) --  cup (75 g) has 1.8 g of fiber. Strawberries (fresh, sliced) -- 1 cup (150 g) has 3 g of fiber. Cherries -- 1 cup (140 g) has 2.9 g of fiber. Vegetables Broccoli (cooked) --  cup (77.5 g) has 2.1 g of fiber. Brussels sprouts (cooked) --  cup (78 g) has 3 g of fiber. Carrots (cooked) --  cup (77.5 g) has 2.2 g of fiber. Corn (canned or frozen) --  cup (82.5 g) has 2.1 g of fiber. Potatoes, mashed --  cup (105 g) has 1.6 g of fiber. Tomato -- 1 medium (62 g) has 1.5 g of fiber. Green beans (canned) --  cup (83 g) has 2 g of fiber. Sweet potato, baked -- 1 medium (150 g) has 3 g of fiber. Cauliflower (cooked) -- 1/2 cup (90 g) has 2.3 g of fiber. Grains Long-grain brown rice (cooked) -- 1 cup (196 g) has 3.5 g of fiber. Bagel, plain -- one 4-inch (10 cm) bagel has 2 g of fiber. Instant oatmeal --  cup (120 g) has about 2 g of fiber. Macaroni noodles, enriched (cooked) -- 1 cup (140 g) has 2.5 g of fiber. Multigrain cereal --  cup (15 g) has about 2-4 g of fiber. Whole-wheat bread -- 1 slice (26 g) has 2 g of fiber. Whole-wheat spaghetti noodles --  cup (70 g) has 3.2 g of fiber. Corn tortilla -- one 6-inch (15 cm) tortilla has 1.5 g of fiber. Meats and other proteins Almonds --  cup or 1 oz (28 g) has 3.5 g of fiber. Sunflower seeds in shell --  cup or  oz (11.5 g) has 1.1 g of fiber. Vegetable or soy patty -- 1 patty (70 g) has 3.4 g of fiber. Walnuts --  cup or 1 oz (30 g) has 2 g of fiber. Flax seed -- 1 Tbsp (7 g) has 2.8 g of fiber. The items listed above may not be a complete list of foods with moderate amounts of fiber. Actual amounts of fiber may be different depending on processing. Contact a dietitian for more information. What foods are low in fiber?  Low-fiber foods contain less than 1 g of fiber per serving. They include: Fruits Fruit juice --  cup or 4 fl oz (118 mL) has 0.5 g of fiber. Vegetables Lettuce -- 1 cup (35 g) has  0.5 g of fiber. Cucumber (slices) --  cup (60 g) has 0.3 g of fiber. Celery -- 1 stalk (40 g) has 0.1 g of fiber. Grains Flour tortilla -- one 6-inch (15 cm) tortilla has 0.5 g of fiber. White rice (cooked) --  cup (81.5 g) has 0.3 g  of fiber. Meats and other proteins Egg -- 1 large (50 g) has 0 g of fiber. Meat, poultry, or fish -- 3 oz (85 g) has 0 g of fiber. Dairy Milk -- 1 cup or 8 fl oz (237 mL) has 0 g of fiber. Yogurt -- 1 cup (245 g) has 0 g of fiber. The items listed above may not be a complete list of foods that are low in fiber. Actual amounts of fiber may be different depending on processing. Contact a dietitian for more information. This information is not intended to replace advice given to you by your health care provider. Make sure you discuss any questions you have with your health care provider. Document Revised: 09/29/2022 Document Reviewed: 09/29/2022 Elsevier Patient Education  2024 [employer].

## 2024-06-18 ENCOUNTER — Encounter: Payer: Self-pay | Admitting: Cardiology

## 2024-06-18 NOTE — Assessment & Plan Note (Addendum)
 Well-controlled with stable EKG and no symptoms.  NYHA class I-IIa. Euvolemic on exam.  Has not required diuretic. GDMT: On losartan  50 mg daily (previously had issues affording Entresto) Toprol  XL 100 mg daily On dapagliflozin  10 mg daily and Ozempic  2 mg weekly Not on spironolactone  because of hyperkalemia issues. For additional BP control we will add chlorthalidone  25 mg daily.

## 2024-06-18 NOTE — Assessment & Plan Note (Signed)
 Managed with amlodipine , metoprolol , and losartan . Losartan  dose reduced due to hyperkalemia, making spironolactone  less valid as a choice..  Will add chlorthalidone  25 mg daily for standing diuretic - Continue amlodipine  10 mg daily and Toprol  100 mg daily - Added chlorthalidone  25 mg in the morning with losartan  which was reduced to 50 mg daily. - Check blood work in two weeks to monitor potassium levels.

## 2024-06-18 NOTE — Assessment & Plan Note (Signed)
 CHA2DS2-VASc score is actually probably 6.  Also has history of LV thrombus. Continue Eliquis  5 mg twice daily Okay to hold Eliquis  2 to 3 days preop for surgeries or procedures.

## 2024-06-18 NOTE — Assessment & Plan Note (Signed)
 Unclear the true etiology, but previously no notable CAD on cath. Likely etiology is hypertensive heart disease.

## 2024-06-18 NOTE — Assessment & Plan Note (Signed)
 Paroxysmal atrial fibrillation and paroxysmal tachycardia (palpitations) Improved palpitations after metoprolol  increase. EKG unchanged. Possible short bursts of atrial fibrillation. - Ordered 14-day Holter monitor to assess for atrial fibrillation episodes. - Continue metoprolol  succinate 100 mg daily - Continue Eliquis  5 mg twice daily

## 2024-06-18 NOTE — Assessment & Plan Note (Signed)
 Most recent lipid panel showed LDL of 82 on 40 mg atorvastatin .  In the absence of any active CAD, this is reasonably at goal. - Continue atorvastatin  40 daily.  A1c at 8.4%. Current regimen includes semaglutide , empagliflozin/metformin , and reduced Lantus  dose. - Continue current diabetes management regimen-managed by PCP.

## 2024-06-18 NOTE — Assessment & Plan Note (Signed)
 Diagnosed in setting of exacerbation of CHF.  Now remains on Eliquis  for combination of LV thrombus and A-fib.

## 2024-06-23 ENCOUNTER — Telehealth: Payer: Self-pay

## 2024-06-23 NOTE — Telephone Encounter (Signed)
 Pharmacy Patient Advocate Encounter  Received notification from [facility] MEDICAID that Prior Authorization for FREESTYLE LIBRE 3 PLUS SENSOR has been APPROVED from 06/23/2024 to 06/23/2025   PA #/Case ID/Reference #: [id number]

## 2024-06-24 ENCOUNTER — Other Ambulatory Visit: Payer: Self-pay

## 2024-06-24 DIAGNOSIS — E118 Type 2 diabetes mellitus with unspecified complications: Secondary | ICD-10-CM

## 2024-06-24 MED ORDER — OZEMPIC (2 MG/DOSE) 8 MG/3ML ~~LOC~~ SOPN: 2.0000 mg | PEN_INJECTOR | SUBCUTANEOUS | 3 refills | Status: AC

## 2024-06-24 NOTE — Progress Notes (Signed)
 06/24/2024 Name: Rodney Kane MRN: [id number] DOB: 1976-02-03  Chief Complaint  Patient presents with   Diabetes   Congestive Heart Failure    Rodney Kane is a 48 y.o. year old male who was referred for medication management by their primary care provider, Rodney Bascom RAMAN, NP. They presented for face-to-face appointment today.   They were referred to the pharmacist by their PCP for assistance in managing diabetes. PMH includes HF (EF 45-50% in Aug 2023, LV global hypokinesis, G1DD), atrial fibrillation, HTN, CVA (2021), hx of VTE (2022), T2DM. Patient is following with complex care management for transportation needs, food insecurity, financial difficulties related to housing, and applying for disability. 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   Subjective: He was engaged by pharmacy via telephone on 01/01/24 at which time I assisted him in applying the FL3+ over the phone. A1C had improved to 10%. He was instructed to increase Ozempic  to 1 mg weekly. We were scheduled for f/u on 02/17/24 but I was not able to reach him. Intermittent review of his CGM report demonstrated that BG were improving after increasing Ozempic , but still slightly above goal. He has been working with SW on applying for disability. At pharmacy call on 04/05/24, patient was doing well. Instructed to increase Ozempic  to 2 mg weekly. At follow-up on 05/24/24, patient was tolerating Ozempic  2 mg weekly well. For CKD benefit, it was recommended that he switch from metformin  to metformin -dapagliflozin  (Xigduo  XR) combination medication. We also decreased his Lantus  to 16 units daily to prevent hypoglycemia. Patient saw Dr. Anner (cardiology) on 06/08/24, who added chlorthalidone  25 mg daily for elevated blood pressure.  Today patient is doing well. Continues to tolerate Ozempic . Confirms medication changes made a previous pharmacy call and recent cardiology appt. Had error with sensor last week, but  has been wearing without issue for the last 3 days.   Care Team: Primary Care Provider: [name], NP ; Next Scheduled Visit: 07/15/24 Cardiology: Dr. Anner - needs to be scheduled ~Oct 2025 (overdue)  Medication Access/Adherence  Current Pharmacy:  [facility] [address], [place] - [address] [place] [address] Phone: [phone] Fax: [phone]   Patient reports affordability concerns with their medications: Yes  Patient reports access/transportation concerns to their pharmacy: No  Patient reports adherence concerns with their medications:  Yes  - hx of taking Eliquis  10 mg at once instead of 5 mg BID. Previously counseled on correct administration.  Patient has been connected with Medicare Complex Care Management and SW for assistance applying for disability given residual deficits with speech.   Diabetes:  Current medications: Xigduo  XR 04-999 mg daily, Lantus  (insulin  glargine) 16 units daily (~1PM), Ozempic  2 mg once weekly (no GI issues, requests 3 mo)  Medications tried in the past: Novolog , sitagliptin , and linagliptin  in the past  Has some GI AE on first or second day after Ozempic , mostly diarrhea, but tolerable.   Current glucose readings: Using FL3+ CGM (Accu Chek Guide as backup)      Patient reports s/sx of hypoglycemia occasionally - but states that it happens infrequently and he does not feel we need to decrease his dose of Lantus . Denies polydipsia, polyphagia, nocturia, blurred vision.   Current meal patterns: Typically 1-2 meals/day around 12-1PM and then in the evening 6-8PM. Reports that some days he does not eat any meals, but instead snacks throughout the day (like multiple granola bars) - Reports that  it is difficult for him to cut out sweets. He will go a couple days without eating sweets, but then will eat a lot at one time.  - Lunch: not consistent, varies - Supper: varies - Snacks: lance  crackers, strawberry yogurt - Drinks water all throughout the day, has switched to diet/sugar-free sodas, koolaid punch zero sugar, minutemaid zero sugar drinks  Current physical activity: occasional walking, has a shake plate at home (core exercise). No other strength training reported.   Current medication access support: insurance - Medicaid  Hypertension:  Current medications: losartan  50 mg PO daily, metoprolol  succinate 100 mg daily, amlodipine  10 mg daily, chlorthalidone  25 mg daily  Medications previously tried: furosemide  40 mg daily PRN (he was instructed to stop this at pharmacy telephone appt on 08/25/23 as it was an old Rx), hydralazine , losartan  100 mg (elevated potassium)  Denies side effects since starting chlorthalidone   Patient has BP cuff at home- has not checked since starting new BP medication, chlorthalidone   Objective:  BP Readings from Last 3 Encounters:  06/08/24 138/88  05/24/24 129/89  04/15/24 139/82   Lab Results  Component Value Date   HGBA1C 8.4 (A) 05/24/2024   HGBA1C 10.0 (H) 12/31/2023   HGBA1C 15.0 (A) 08/21/2023   eGFR > 60 mL/min, UACR 10/01/23: 4 mg/g  Lab Results  Component Value Date   CREATININE 1.40 (H) 04/15/2024   BUN 10 04/15/2024   NA 141 04/15/2024   K 5.1 04/15/2024   CL 101 04/15/2024   CO2 24 04/15/2024    Lab Results  Component Value Date   CHOL 146 12/31/2023   HDL 51 12/31/2023   LDLCALC 82 12/31/2023   TRIG 64 12/31/2023   CHOLHDL 2.9 12/31/2023    Medications Reviewed Today     Reviewed by Brinda Lorain SQUIBB, RPH-CPP (Pharmacist) on 06/24/24 at 1138  Med List Status: <None>   Medication Order Taking? Sig Documenting Provider Last Dose Status Informant  amLODipine  (NORVASC ) 10 MG tablet [id number] Yes Take 1 tablet (10 mg total) by mouth daily. Paseda, Folashade R, FNP  Active   apixaban  (ELIQUIS ) 5 MG TABS tablet [id number] Yes Take 1 tablet (5 mg total) by mouth 2 (two) times daily. Rodney Bascom RAMAN, NP  Active    atorvastatin  (LIPITOR ) 40 MG tablet [id number]  Take 1 tablet (40 mg total) by mouth daily. Rodney Bascom RAMAN, NP  Active   Blood Glucose Monitoring Suppl DEVI [id number]  1 each by Does not apply route in the morning, at noon, and at bedtime. May substitute to any manufacturer covered by patient's insurance. Rodney Bascom RAMAN, NP  Active            Med Note DELSA LORAIN SQUIBB Pablo Dec 21, 2023  2:46 PM)    chlorthalidone  (HYGROTON ) 25 MG tablet [id number] Yes Take 1 tablet (25 mg total) by mouth daily. Anner Alm ORN, MD  Active   Continuous Glucose Sensor (FREESTYLE LIBRE 3 PLUS SENSOR) [place] [id number] Yes Change sensor every 15 days. Rodney Bascom RAMAN, NP  Active   Dapagliflozin  Pro-metFORMIN  ER (XIGDUO  XR) 04-999 MG TB24 [id number] Yes Take 1 tablet by mouth daily. Rodney Bascom RAMAN, NP  Active   diclofenac  (VOLTAREN ) 75 MG EC tablet [id number]  Take 1 tablet (75 mg total) by mouth 2 (two) times daily. Rodney Bascom RAMAN, NP  Active   Insulin  Pen Needle (PEN NEEDLES) 32G X 4 MM MISC [id number]  Use as directed to inject insulin  once daily Nichols, Tonya S,  NP  Active   LANTUS  SOLOSTAR 100 UNIT/ML Solostar Pen [id number] Yes Inject 20 Units into the skin daily. May increase up to 30 units daily as instructed by your provider.  Patient taking differently: Inject 16 Units into the skin daily. May increase up to 30 units daily as instructed by your provider.   Rodney Bascom RAMAN, NP  Active   losartan  (COZAAR ) 50 MG tablet [id number] Yes Take 1 tablet (50 mg total) by mouth daily. Paseda, Folashade R, FNP  Active   metoprolol  succinate (TOPROL -XL) 100 MG 24 hr tablet [id number] Yes Take 1 tablet (100 mg total) by mouth daily. Take with or immediately following a meal. Anner Alm ORN, MD  Active   Semaglutide , 2 MG/DOSE, (OZEMPIC , 2 MG/DOSE,) 8 MG/3ML SOPN [id number] Yes Inject 2 mg into the skin once a week. Rodney Bascom RAMAN, NP  Active   tadalafil (CIALIS) 20 MG tablet [id number]  Take 20 mg by mouth daily as needed.  [provider]  Active             Assessment/Plan:   Diabetes: - Currently controlled with TIR of 90% above goal > 70%. Last A1C still elevated at 8.4% above goal < 7%, however expect improvement after longer period of treatment with Ozempic  2 mg weekly and initiation of SGLT2i. Will continue current dose of basal insulin  today, but patient aware to let us  know if he experiences hypoglycemia and feels he needs a dose reduction. - UACR March 2025 - 4 mg/g - Reviewed long term cardiovascular and renal outcomes of uncontrolled blood sugar - Reviewed goal A1c, goal fasting, and goal 2 hour post prandial glucose - Reviewed dietary modifications including increasing protein intake, avoiding carbohydrate heavy foods. Maintaining water intake and avoiding sugar-sweetened beverages. Educated extensively on lifestyle interventions to avoid GI upset with GLP-1RA.  - Reviewed lifestyle modifications including: increasing weight bearing exercise to avoid muscle loss with GLP-1RA.  - Recommend to continue Ozempic  (semaglutide ) 2 mg injected into the skin once weekly - Recommend to continue insulin  glargine (Lantus ) to 16 units injected into the skin daily - Recommend to continue Xigduo  XR (dapagliflozin -metformin ) 04-999 mg once daily - Patient denies personal or family history of multiple endocrine neoplasia type 2, medullary thyroid  cancer; personal history of pancreatitis or gallbladder disease. - Recommend to monitor BG continuously with FL3+ CGM.  - Next A1C due 08/25/23   Hypertension/HFrEF: - Currently uncontrolled above goal < 130/80 mmHg. Following with cardiology. Agree with addition of chlorthalidone . Encouraged patient to have repeat BMP as directed early next week. Unable to titrate ARB as he had hyperkalemia with losartan  100 mg daily. K decreased from 5.3 mEq/L to 5.1 mEq/L since decreasing to 50 mg daily. Thiazide diuretic will likely help keep potassium WNL. Cannot add  spironolactone  at this time with potassium of 5.1 mEq/L. Could consider switching to Entresto in the future if potassium normalizes. - Reviewed long term cardiovascular and renal outcomes of uncontrolled blood pressure - Reviewed appropriate blood pressure monitoring technique and reviewed goal blood pressure. Recommended to check home blood pressure and heart rate a couple times per week.  - Recommend to continue losartan  to 50 mg daily  - Recommend to continue amlodipine  to 10 mg daily  - Recommend to continue metoprolol  100 mg daily - Recommend to continue chlorthalidone  25 mg daily - Recommend to continue SGLT2i (dapagliflozin  10 mg daily) in Xigduo  (dapagliflozin -metformin ) as above   Hyperlipidemia/ASCVD Risk Reduction: - Currently uncontrolled with last LDL-C of 82 mg/dL  above goal < 55 mg/dL given premature ASCVD + T2DM and multiple risk factors, however - he has only been taking high-intensity statin since 12/10/23. He would likely be a good candidate for Repatha in the future, but did not discuss this today. Medicaid will require trial of ezetimibe prior to coverage of PCSK9i. - Reviewed long term complications of uncontrolled cholesterol - Recommend to continue atorvastatin  40 mg PO daily  - Repeat lipid panel ~ Dec 2025   Patient verbalized understanding of plan.    Follow Up Plan: PCP 07/15/24, Pharmacist telephone 08/06/23   Lorain Baseman, PharmD [facility] [phone]

## 2024-07-11 ENCOUNTER — Other Ambulatory Visit: Payer: Self-pay

## 2024-07-11 NOTE — Patient Outreach (Signed)
 Complex Care Management   Visit Note  07/11/2024  Name:  Rodney Kane MRN: [id number] DOB: 25-Jun-1976  Situation: Referral received for Complex Care Management related to Diabetes with Complications I obtained verbal consent from Patient.  Visit completed with Patient  on the phone  Background:   Past Medical History:  Diagnosis Date   Coronavirus infection 06/2020   COVID 06/2020   Diabetes mellitus without complication (HCC)    Hemoglobin A1C greater than 9%, indicating poor diabetic control 12/2019   Hyperglycemia 12/2019   Hyperlipidemia 12/2019   Hypertension    Hypertensive urgency 12/2019   Noncompliance with medication regimen 12/2019   Nonischemic (hypertensive) dilated cardiomyopathy (HCC) 12/2016   EF has been 30 to 35%, recently lower.   Stroke [facility])    Urine ketones 12/2019   Vitamin D  deficiency 12/2019    Assessment: Patient Reported Symptoms:  Cognitive Cognitive Status: Able to follow simple commands, Alert and oriented to person, place, and time, Other: (Diffulty speaking/stuttering) Cognitive/Intellectual Conditions Management [RPT]: Brain Injury Brain Injury: CVA (2021, 2022)   Health Maintenance Behaviors: Annual physical exam, Exercise Health Facilitated by: Pain control, Rest  Neurological Neurological Review of Symptoms: Not assessed    HEENT HEENT Symptoms Reported: Not assessed      Cardiovascular Cardiovascular Symptoms Reported: No symptoms reported Does patient have uncontrolled Hypertension?: Yes Is patient checking Blood Pressure at home?: No Cardiovascular Management Strategies: Medication therapy, Routine screening Cardiovascular Comment: Patient reports he still has not been checking his BP or his weight. He reports he did complete Zio monitor as ordered and has sent it back. He is waiting on results to be reviewed  Respiratory Respiratory Symptoms Reported: No symptoms reported    Endocrine Endocrine Symptoms Reported:  Hypoglycemia Other symptoms related to hypoglycemia or hyperglycemia: Patient reports an episode of hypoglycemia last night. He reports his CGM kept disconnecting, but was reading that his blood sugar was 93. He reports during this episode he felt dizzy and lightheaded. Confirmed that patient does have glucometer to check blood sugar via fingerstick if CGM fails. Patient does not feel that episodes of hypoglycemia have increased Is patient diabetic?: Yes Is patient checking blood sugars at home?: Yes List most recent blood sugar readings, include date and time of day: FreeStyle Libre    Gastrointestinal Gastrointestinal Symptoms Reported: No symptoms reported Additional Gastrointestinal Details: Patient reports last BM yesterday Gastrointestinal Management Strategies: Diet modification, Medication therapy    Genitourinary Genitourinary Symptoms Reported: Other Other Genitourinary Symptoms: Paitent reports continued issues related to erectily dysfunction. Advised that he discuss with PCP at upcoming appointment 07/15/24 Genitourinary Management Strategies: Coping strategies  Integumentary Integumentary Symptoms Reported: Not assessed    Musculoskeletal Musculoskelatal Symptoms Reviewed: Joint pain Additional Musculoskeletal Details: Patient reports left shoulder pain is not as bad as it was, but is still there sometimes. He reports pain is more of a dull pain now. Musculoskeletal Management Strategies: Medication therapy, Routine screening, Coping strategies Musculoskeletal Comment: Patient denies falls since previous CMRN visit Falls in the past year?: No Number of falls in past year: 1 or less Was there an injury with Fall?: No Fall Risk Category Calculator: 0 Patient Fall Risk Level: Low Fall Risk Patient at Risk for Falls Due to: No Fall Risks Fall risk Follow up: Falls evaluation completed, Education provided, Falls prevention discussed  Psychosocial Psychosocial Symptoms Reported: No  symptoms reported          07/11/2024    PHQ2-9 Depression Screening   Little interest  or pleasure in doing things Not at all  Feeling down, depressed, or hopeless Not at all  PHQ-2 - Total Score 0  Trouble falling or staying asleep, or sleeping too much    Feeling tired or having little energy    Poor appetite or overeating     Feeling bad about yourself - or that you are a failure or have let yourself or your family down    Trouble concentrating on things, such as reading the newspaper or watching television    Moving or speaking so slowly that other people could have noticed.  Or the opposite - being so fidgety or restless that you have been moving around a lot more than usual    Thoughts that you would be better off dead, or hurting yourself in some way    PHQ2-9 Total Score    If you checked off any problems, how difficult have these problems made it for you to do your work, take care of things at home, or get along with other people    Depression Interventions/Treatment      There were no vitals filed for this visit. Pain Scale: 0-10 Pain Score: 4  Pain Type: Acute pain Pain Location: Shoulder Pain Orientation: Left  Medications Reviewed Today     Reviewed by Arno Rosaline SQUIBB, RN (Registered Nurse) on 07/11/24 at 1332  Med List Status: <None>   Medication Order Taking? Sig Documenting Provider Last Dose Status Informant  amLODipine  (NORVASC ) 10 MG tablet [id number]  Take 1 tablet (10 mg total) by mouth daily. Paseda, Folashade R, FNP  Active   apixaban  (ELIQUIS ) 5 MG TABS tablet [id number]  Take 1 tablet (5 mg total) by mouth 2 (two) times daily. [name], NP  Active   atorvastatin  (LIPITOR ) 40 MG tablet [id number]  Take 1 tablet (40 mg total) by mouth daily. [name], NP  Active   Blood Glucose Monitoring Suppl DEVI [id number]  1 each by Does not apply route in the morning, at noon, and at bedtime. May substitute to any manufacturer covered by patient's  insurance. [name], NP  Active            Med Note DELSA LORAIN SQUIBB   Mon Dec 21, 2023  2:46 PM)    chlorthalidone  (HYGROTON ) 25 MG tablet [id number]  Take 1 tablet (25 mg total) by mouth daily. Anner Alm ORN, MD  Active   Continuous Glucose Sensor (FREESTYLE LIBRE 3 PLUS SENSOR) [place] [id number]  Change sensor every 15 days. [name], NP  Active   Dapagliflozin  Pro-metFORMIN  ER (XIGDUO  XR) 04-999 MG TB24 [id number]  Take 1 tablet by mouth daily. [name], NP  Active   diclofenac  (VOLTAREN ) 75 MG EC tablet [id number]  Take 1 tablet (75 mg total) by mouth 2 (two) times daily. [name], NP  Active   Insulin  Pen Needle (PEN NEEDLES) 32G X 4 MM MISC [id number]  Use as directed to inject insulin  once daily Nichols, Tonya S, NP  Active   LANTUS  SOLOSTAR 100 UNIT/ML Solostar Pen [id number]  Inject 20 Units into the skin daily. May increase up to 30 units daily as instructed by your provider.  Patient taking differently: Inject 16 Units into the skin daily. May increase up to 30 units daily as instructed by your provider.   [name], NP  Active   losartan  (COZAAR ) 50 MG tablet [id number]  Take 1 tablet (50 mg total) by mouth daily. Paseda, Folashade  R, FNP  Active   metoprolol  succinate (TOPROL -XL) 100 MG 24 hr tablet [id number]  Take 1 tablet (100 mg total) by mouth daily. Take with or immediately following a meal. Anner Alm ORN, MD  Active   Semaglutide , 2 MG/DOSE, (OZEMPIC , 2 MG/DOSE,) 8 MG/3ML SOPN [id number]  Inject 2 mg into the skin once a week. [name], NP  Active   tadalafil (CIALIS) 20 MG tablet [id number]  Take 20 mg by mouth daily as needed. [provider]  Active             Recommendation:   Continue Current Plan of Care  Follow Up Plan:   Telephone follow up appointment date/time:  08/08/24 at 2 PM  [name], RN MSN [place]  [facility] RN Care Manager Direct Dial: [phone]  Fax:  [phone]

## 2024-07-11 NOTE — Patient Instructions (Signed)
 Visit Information  [name] was given information about Medicaid Managed Care team care coordination services as a part of their [employer] Medicaid benefit.   If you would like to schedule transportation through your [facility] plan, please call the following number at least 2 days in advance of your appointment: [phone]  If you are experiencing a behavioral health crisis, call the [employer] [place]  Behavioral Health Crisis Line at [phone]). The line is available 24 hours a day, seven days a week.   Care plan and visit instructions communicated with the patient verbally today. Patient agrees to receive a copy in MyChart. Active MyChart status and patient understanding of how to access instructions and care plan via MyChart confirmed with patient.     Telephone follow up appointment with Managed Medicaid care management team member scheduled for: 08/08/24 at 2 PM  [name], RN MSN [place]  [employer] Population [facility] RN Care Manager Direct Dial: [phone]  Fax: [phone]   Following is a copy of your plan of care:   Goals Addressed             This Visit's Progress    [employer] RN Care Plan- DM   On track    Problems:  Chronic Disease Management support and education needs related to DMII  Goal: Over the next 30 days the Patient will demonstrate Ongoing adherence to prescribed treatment plan for DM II as evidenced by patient report of fasting blood sugar 80-130 verbalize basic understanding of DM II disease process and self health management plan as evidenced by verbal explanation lifestyle changes and consistent medication compliance Report no episodes of hypoglycemia   Interventions:   Diabetes Interventions: Assessed patient's understanding of A1c goal: <7% Reviewed medications with patient and discussed importance of medication adherence Counseled on importance of regular laboratory  monitoring as prescribed Discussed plans with patient for ongoing care management follow up and provided patient with direct contact information for care management team Provided patient with written educational materials related to hypo and hyperglycemia and importance of correct treatment Review of patient status, including review of consultants reports, relevant laboratory and other test results, and medications completed Reviewed most recent A1c, goal A1c, and how to achieve goal Ensured patient has alternate way to check blood sugar if CGM malfunctions Lab Results  Component Value Date   HGBA1C 8.4 (A) 05/24/2024    General Interventions Reminded patient to discuss concerns related to erectile dysfunction with PCP at upcoming appointment 07/15/24 Offered to start 3-way call to orthopedic office. Patient declines and states he will wait for office to contact him. Provided with office phone number if he changes his mind Call placed to [facility] to check on the status of patient's SSDI/SSI application. Per representative, patient will need to call social security or check his online account to see status of application since it it has already been submitted. Representative does state that [facility] sent patient a letter requesting additional information. Patient confirms that he received letter from [facility] and sent additional documentation as requested. Advised he contact [employer] or sign up for online account to track the status of his application  Patient Self-Care Activities:  Attend all scheduled provider appointments Call pharmacy for medication refills 3-7 days in advance of running out of medications Call provider office for new concerns or questions  Perform all self care activities independently  Take medications as prescribed   keep appointment with eye doctor check blood sugar at  prescribed times: three times daily drink 6 to 8 glasses of water each day limit fast food meals to no  more than 1 per week  Plan:  Telephone follow up appointment with care management team member scheduled for:  08/08/24 at 2 PM

## 2024-07-14 DIAGNOSIS — I480 Paroxysmal atrial fibrillation: Secondary | ICD-10-CM | POA: Diagnosis not present

## 2024-07-14 DIAGNOSIS — R000 Tachycardia, unspecified: Secondary | ICD-10-CM | POA: Diagnosis not present

## 2024-07-15 ENCOUNTER — Encounter: Payer: Self-pay | Admitting: Nurse Practitioner

## 2024-07-15 ENCOUNTER — Ambulatory Visit: Payer: Self-pay | Admitting: Cardiology

## 2024-07-15 ENCOUNTER — Ambulatory Visit: Payer: Self-pay | Admitting: Nurse Practitioner

## 2024-07-15 VITALS — BP 117/85 | HR 98 | Temp 97.2°F | Wt 331.0 lb

## 2024-07-15 DIAGNOSIS — I10 Essential (primary) hypertension: Secondary | ICD-10-CM

## 2024-07-15 DIAGNOSIS — M7502 Adhesive capsulitis of left shoulder: Secondary | ICD-10-CM

## 2024-07-15 DIAGNOSIS — R5383 Other fatigue: Secondary | ICD-10-CM

## 2024-07-15 DIAGNOSIS — R7309 Other abnormal glucose: Secondary | ICD-10-CM | POA: Diagnosis not present

## 2024-07-15 MED ORDER — DICLOFENAC SODIUM 75 MG PO TBEC: 75.0000 mg | DELAYED_RELEASE_TABLET | Freq: Two times a day (BID) | ORAL | 0 refills | Status: AC

## 2024-07-15 NOTE — Progress Notes (Signed)
 "  Subjective   Patient ID: Rodney Kane, male    DOB: 01-Jan-1976, 48 y.o.   MRN: [id number]  Chief Complaint  Patient presents with   Follow-up    3 month. Testerone levels checked.     Referring provider: [name], NP  Rodney Kane is a 48 y.o. male with Past Medical History: 06/2020: Coronavirus infection 06/2020: COVID No date: Diabetes mellitus without complication (HCC) 12/2019: Hemoglobin A1C greater than 9%, indicating poor diabetic  control 12/2019: Hyperglycemia 12/2019: Hyperlipidemia No date: Hypertension 12/2019: Hypertensive urgency 12/2019: Noncompliance with medication regimen 12/2016: Nonischemic (hypertensive) dilated cardiomyopathy (HCC)     Comment:  EF has been 30 to 35%, recently lower. No date: Stroke [facility]) 12/2019: Urine ketones 12/2019: Vitamin D  deficiency   HPI  Patient presents today for diabetes and hypertension follow-up. Patient has been noncompliant with his medications and did not take medications today. Hemoglobin A1c is improving at 8.8 at last visit. Following with cardiology and neurology for history of stroke and A-fib. Following with pharmacy for medication management. Needs refill on naproxen for shoulder pain. Requesting testosterone be checked for fatigue. Denies f/c/s, n/v/d, hemoptysis, PND, chest pain or edema.   Allergies[1]  Immunization History  Administered Date(s) Administered   Tdap 12/01/2017    Tobacco History: Tobacco Use History[2] Counseling given: Not Answered   Outpatient Encounter Medications as of 07/15/2024  Medication Sig   amLODipine  (NORVASC ) 10 MG tablet Take 1 tablet (10 mg total) by mouth daily.   apixaban  (ELIQUIS ) 5 MG TABS tablet Take 1 tablet (5 mg total) by mouth 2 (two) times daily.   atorvastatin  (LIPITOR ) 40 MG tablet Take 1 tablet (40 mg total) by mouth daily.   Blood Glucose Monitoring Suppl DEVI 1 each by Does not apply route in the morning, at noon, and at  bedtime. May substitute to any manufacturer covered by patient's insurance.   chlorthalidone  (HYGROTON ) 25 MG tablet Take 1 tablet (25 mg total) by mouth daily.   Continuous Glucose Sensor (FREESTYLE LIBRE 3 PLUS SENSOR) MISC Change sensor every 15 days.   Dapagliflozin  Pro-metFORMIN  ER (XIGDUO  XR) 04-999 MG TB24 Take 1 tablet by mouth daily.   Insulin  Pen Needle (PEN NEEDLES) 32G X 4 MM MISC Use as directed to inject insulin  once daily   LANTUS  SOLOSTAR 100 UNIT/ML Solostar Pen Inject 20 Units into the skin daily. May increase up to 30 units daily as instructed by your provider.   losartan  (COZAAR ) 50 MG tablet Take 1 tablet (50 mg total) by mouth daily.   metoprolol  succinate (TOPROL -XL) 100 MG 24 hr tablet Take 1 tablet (100 mg total) by mouth daily. Take with or immediately following a meal.   Semaglutide , 2 MG/DOSE, (OZEMPIC , 2 MG/DOSE,) 8 MG/3ML SOPN Inject 2 mg into the skin once a week.   tadalafil (CIALIS) 20 MG tablet Take 20 mg by mouth daily as needed.   diclofenac  (VOLTAREN ) 75 MG EC tablet Take 1 tablet (75 mg total) by mouth 2 (two) times daily.   [DISCONTINUED] diclofenac  (VOLTAREN ) 75 MG EC tablet Take 1 tablet (75 mg total) by mouth 2 (two) times daily. (Patient not taking: Reported on 07/15/2024)   No facility-administered encounter medications on file as of 07/15/2024.    Review of Systems  Review of Systems  Constitutional: Negative.   HENT: Negative.    Cardiovascular: Negative.   Gastrointestinal: Negative.   Allergic/Immunologic: Negative.   Neurological: Negative.   Psychiatric/Behavioral: Negative.  Objective:   BP 117/85 (BP Location: Right Arm, Patient Position: Sitting, Cuff Size: Normal)   Pulse 98   Temp (!) 97.2 F (36.2 C) (Temporal)   Wt (!) 331 lb (150.1 kg)   SpO2 100%   BMI 43.67 kg/m   Wt Readings from Last 5 Encounters:  07/15/24 (!) 331 lb (150.1 kg)  06/08/24 (!) 340 lb (154.2 kg)  05/24/24 (!) 340 lb 9.6 oz (154.5 kg)   04/15/24 (!) 341 lb (154.7 kg)  01/14/24 (!) 341 lb (154.7 kg)     Physical Exam Vitals and nursing note reviewed.  Constitutional:      General: He is not in acute distress.    Appearance: He is well-developed.  Cardiovascular:     Rate and Rhythm: Normal rate and regular rhythm.  Pulmonary:     Effort: Pulmonary effort is normal.     Breath sounds: Normal breath sounds.  Skin:    General: Skin is warm and dry.  Neurological:     Mental Status: He is alert and oriented to person, place, and time.       Assessment & Plan:   Hemoglobin A1C greater than 9%, indicating poor diabetic control -     Hemoglobin A1c  Adhesive capsulitis of left shoulder -     Diclofenac  Sodium; Take 1 tablet (75 mg total) by mouth 2 (two) times daily.  Dispense: 30 tablet; Refill: 0  Essential hypertension -     CBC -     Comprehensive metabolic panel with GFR  Other fatigue -     Testosterone     Return in about 3 months (around 10/13/2024).   Bascom GORMAN Borer, NP 07/15/2024     [1] No Known Allergies [2]  Social History Tobacco Use  Smoking Status Never  Smokeless Tobacco Never   "

## 2024-07-16 LAB — COMPREHENSIVE METABOLIC PANEL WITH GFR
ALT: 19 IU/L (ref 0–44)
AST: 17 IU/L (ref 0–40)
Albumin: 4.2 g/dL (ref 4.1–5.1)
Alkaline Phosphatase: 79 IU/L (ref 47–123)
BUN/Creatinine Ratio: 13 (ref 9–20)
BUN: 15 mg/dL (ref 6–24)
Bilirubin Total: 0.7 mg/dL (ref 0.0–1.2)
CO2: 25 mmol/L (ref 20–29)
Calcium: 9.6 mg/dL (ref 8.7–10.2)
Chloride: 98 mmol/L (ref 96–106)
Creatinine, Ser: 1.19 mg/dL (ref 0.76–1.27)
Globulin, Total: 3.4 g/dL (ref 1.5–4.5)
Glucose: 112 mg/dL — ABNORMAL HIGH (ref 70–99)
Potassium: 3.6 mmol/L (ref 3.5–5.2)
Sodium: 139 mmol/L (ref 134–144)
Total Protein: 7.6 g/dL (ref 6.0–8.5)
eGFR: 75 mL/min/1.73

## 2024-07-16 LAB — CBC
Hematocrit: 46 % (ref 37.5–51.0)
Hemoglobin: 14.8 g/dL (ref 13.0–17.7)
MCH: 25.4 pg — ABNORMAL LOW (ref 26.6–33.0)
MCHC: 32.2 g/dL (ref 31.5–35.7)
MCV: 79 fL (ref 79–97)
Platelets: 294 x10E3/uL (ref 150–450)
RBC: 5.83 x10E6/uL — ABNORMAL HIGH (ref 4.14–5.80)
RDW: 12.8 % (ref 11.6–15.4)
WBC: 7.6 x10E3/uL (ref 3.4–10.8)

## 2024-07-16 LAB — TESTOSTERONE: Testosterone: 332 ng/dL (ref 264–916)

## 2024-07-16 LAB — HEMOGLOBIN A1C
Est. average glucose Bld gHb Est-mCnc: 183 mg/dL
Hgb A1c MFr Bld: 8 % — ABNORMAL HIGH (ref 4.8–5.6)

## 2024-07-20 ENCOUNTER — Ambulatory Visit: Payer: Self-pay | Admitting: Nurse Practitioner

## 2024-07-20 NOTE — Progress Notes (Signed)
 Patient reviewed lab results and provider recommendations via MyChart

## 2024-07-25 ENCOUNTER — Encounter: Payer: Self-pay | Admitting: Internal Medicine

## 2024-08-03 ENCOUNTER — Ambulatory Visit: Admitting: Cardiology

## 2024-08-05 ENCOUNTER — Other Ambulatory Visit: Payer: Self-pay

## 2024-08-05 DIAGNOSIS — I5022 Chronic systolic (congestive) heart failure: Secondary | ICD-10-CM

## 2024-08-05 DIAGNOSIS — E118 Type 2 diabetes mellitus with unspecified complications: Secondary | ICD-10-CM

## 2024-08-05 NOTE — Progress Notes (Signed)
 "  08/05/2024 Name: Rodney Kane MRN: [id number] DOB: Nov 19, 1975  Chief Complaint  Patient presents with   Diabetes   Congestive Heart Failure    Rodney Kane is a 49 y.o. year old male who was referred for medication management by their primary care provider, [name], NP. They presented for face-to-face appointment today.   They were referred to the pharmacist by their PCP for assistance in managing diabetes. PMH includes HF (EF 45-50% in Aug 2023, LV global hypokinesis, G1DD), atrial fibrillation, HTN, CVA (2021), hx of VTE (2022), T2DM. Patient is following with complex care management for transportation needs, food insecurity, financial difficulties related to housing, and applying for disability. 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   Subjective: Patient was seen by PCP Bascom Oley, NP on 07/15/24. BP was improved at 117/85 mmHg since starting chlorthalidone . BMP demonstrated stable kidney function and potassium of 3.6 mEq/L. A1C was down to 8.0%.   Today, patient reports doing well. States he is due for refills of his medications but has not picked them up yet due to lack of funds. Anticipates he will be able to get them over the weekend, but was educated on using a charge account if needed since he has [place] Medicaid. He reports he will be moving soon to [place], [place].   Care Team: Primary Care Provider: [name], NP ; Next Scheduled Visit: 10/14/23 Cardiology: [name], NP 08/11/24  Medication Access/Adherence  Current Pharmacy:  [facility] [address] [place] - [address] [place] [address] Phone: [phone] Fax: [phone]   Patient reports affordability concerns with their medications: Yes  Patient reports access/transportation concerns to their pharmacy: No  Patient reports adherence concerns with their medications:  Yes  - hx of taking Eliquis  10 mg at once instead of  5 mg BID. Previously counseled on correct administration.  Patient has been connected with Medicare Complex Care Management and SW for assistance applying for disability given residual deficits with speech.   Diabetes:  Current medications: Xigduo  XR 04-999 mg daily, Lantus  (insulin  glargine) 16 units daily (~1PM), Ozempic  2 mg once weekly  Medications tried in the past: Novolog , sitagliptin , and linagliptin  in the past  Denies nausea, vomiting, constipation with Ozempic  2 mg weekly.  Current glucose readings: Using FL3+ CGM (Accu Chek Guide as backup)      Patient reports s/sx of hypoglycemia occasionally - but states that it happens infrequently.Denies polydipsia, polyphagia, nocturia, blurred vision.   Current meal patterns: Typically 1-2 meals/day around 12-1PM and then in the evening 6-8PM. Reports that some days he does not eat any meals, but instead snacks throughout the day (like multiple granola bars) - Reports that it is difficult for him to cut out sweets. He will go a couple days without eating sweets, but then will eat a lot at one time.  - Lunch: not consistent, varies - Supper: varies - Snacks: lance crackers, strawberry yogurt - Drinks water all throughout the day, has switched to diet/sugar-free sodas, koolaid punch zero sugar, minutemaid zero sugar drinks  Current physical activity: did not discuss in detail today  Current medication access support: insurance - Medicaid  Hypertension:  Current medications: losartan  50 mg PO daily, metoprolol  succinate 100 mg daily, amlodipine  10 mg daily, chlorthalidone  25 mg daily  Medications previously tried: furosemide  40 mg daily PRN (he was instructed to stop this at pharmacy telephone appt on 08/25/23 as it was an old  Rx), hydralazine , losartan  100 mg (elevated potassium)  Patient has BP cuff at home- has not checked recently  Objective:  BP Readings from Last 3 Encounters:  07/15/24 117/85  06/08/24 138/88   05/24/24 129/89   Lab Results  Component Value Date   HGBA1C 8.0 (H) 07/15/2024   HGBA1C 8.4 (A) 05/24/2024   HGBA1C 10.0 (H) 12/31/2023   eGFR > 60 mL/min, UACR 10/01/23: 4 mg/g  Lab Results  Component Value Date   CREATININE 1.19 07/15/2024   BUN 15 07/15/2024   NA 139 07/15/2024   K 3.6 07/15/2024   CL 98 07/15/2024   CO2 25 07/15/2024    Lab Results  Component Value Date   CHOL 146 12/31/2023   HDL 51 12/31/2023   LDLCALC 82 12/31/2023   TRIG 64 12/31/2023   CHOLHDL 2.9 12/31/2023    Medications Reviewed Today     Reviewed by Brinda Lorain SQUIBB, RPH-CPP (Pharmacist) on 08/05/24 at 1150  Med List Status: <None>   Medication Order Taking? Sig Documenting Provider Last Dose Status Informant  amLODipine  (NORVASC ) 10 MG tablet [id number] Yes Take 1 tablet (10 mg total) by mouth daily. Paseda, Folashade R, FNP  Active   apixaban  (ELIQUIS ) 5 MG TABS tablet [id number] Yes Take 1 tablet (5 mg total) by mouth 2 (two) times daily. [name], NP  Active   atorvastatin  (LIPITOR ) 40 MG tablet [id number] Yes Take 1 tablet (40 mg total) by mouth daily. [name], NP  Active   Blood Glucose Monitoring Suppl DEVI [id number]  1 each by Does not apply route in the morning, at noon, and at bedtime. May substitute to any manufacturer covered by patient's insurance. [name], NP  Active            Med Note DELSA LORAIN SQUIBB Pablo Dec 21, 2023  2:46 PM)    chlorthalidone  (HYGROTON ) 25 MG tablet [id number] Yes Take 1 tablet (25 mg total) by mouth daily. Anner Alm ORN, MD  Active   Continuous Glucose Sensor (FREESTYLE LIBRE 3 PLUS SENSOR) [place] [id number] Yes Change sensor every 15 days. [name], NP  Active   Dapagliflozin  Pro-metFORMIN  ER (XIGDUO  XR) 04-999 MG TB24 [id number]  Take 1 tablet by mouth daily. [name], NP  Active   diclofenac  (VOLTAREN ) 75 MG EC tablet [id number]  Take 1 tablet (75 mg total) by mouth 2 (two) times daily. [name], NP  Active    Insulin  Pen Needle (PEN NEEDLES) 32G X 4 MM MISC [id number]  Use as directed to inject insulin  once daily Nichols, Tonya S, NP  Active   LANTUS  SOLOSTAR 100 UNIT/ML Solostar Pen [id number] Yes Inject 20 Units into the skin daily. May increase up to 30 units daily as instructed by your provider. [name], NP  Active   losartan  (COZAAR ) 50 MG tablet [id number] Yes Take 1 tablet (50 mg total) by mouth daily. Paseda, Folashade R, FNP  Active   metoprolol  succinate (TOPROL -XL) 100 MG 24 hr tablet [id number] Yes Take 1 tablet (100 mg total) by mouth daily. Take with or immediately following a meal. Anner Alm ORN, MD  Active   Semaglutide , 2 MG/DOSE, (OZEMPIC , 2 MG/DOSE,) 8 MG/3ML SOPN [id number] Yes Inject 2 mg into the skin once a week. [name], NP  Active   tadalafil (CIALIS) 20 MG tablet [id number]  Take 20 mg by mouth daily as needed. [provider]  Active  Assessment/Plan:   Diabetes: - Currently controlled with TIR of 93% above goal > 70%. Last A1C still elevated at 8.0% above goal < 7%, but expect continued improvement based on CGM report. He continues to be a good candidate for GLP-1RA and SGLT2i with BMI > 40 and hx of CHF and stroke.Will continue current dose of basal insulin  today, but patient aware to let us  know if he experiences hypoglycemia and feels he needs a dose reduction. - UACR March 2025 - 4 mg/g - Reviewed long term cardiovascular and renal outcomes of uncontrolled blood sugar - Reviewed goal A1c, goal fasting, and goal 2 hour post prandial glucose - Reviewed dietary modifications including increasing protein intake, avoiding carbohydrate heavy foods. Maintaining water intake and avoiding sugar-sweetened beverages. Educated extensively on lifestyle interventions to avoid GI upset with GLP-1RA.  - Reviewed lifestyle modifications including: increasing weight bearing exercise to avoid muscle loss with GLP-1RA.  - Recommend to continue Ozempic   (semaglutide ) 2 mg injected into the skin once weekly - Recommend to continue insulin  glargine (Lantus ) to 16 units injected into the skin daily - Recommend to continue Xigduo  XR (dapagliflozin -metformin ) 04-999 mg once daily - Patient denies personal or family history of multiple endocrine neoplasia type 2, medullary thyroid  cancer; personal history of pancreatitis or gallbladder disease. - Recommend to monitor BG continuously with FL3+ CGM.  - Next A1C due 10/13/24   Hypertension/HFrEF: - Currently uncontrolled above goal < 130/80 mmHg, but MUCH improved since starting chlorthalidone . Kidney function stable and potassium decreased to 3.6 mEq/L, but still WNL. Patient now be able to tolerate higher dose of ARB or spironolactone  if additional BP control is needed.  - Reviewed long term cardiovascular and renal outcomes of uncontrolled blood pressure - Reviewed appropriate blood pressure monitoring technique and reviewed goal blood pressure. Recommended to check home blood pressure and heart rate a couple times per week.  - Recommend to continue losartan  to 50 mg daily  - Recommend to continue amlodipine  to 10 mg daily  - Recommend to continue metoprolol  100 mg daily - Recommend to continue chlorthalidone  25 mg daily - Recommend to continue SGLT2i (dapagliflozin  10 mg daily) in Xigduo  (dapagliflozin -metformin ) as above   Hyperlipidemia/ASCVD Risk Reduction: - Currently uncontrolled with last LDL-C of 82 mg/dL above goal < 55 mg/dL given premature ASCVD + T2DM and multiple risk factors, however - he has only been taking high-intensity statin since 12/10/23. He would likely be a good candidate for Repatha in the future, but did not discuss this today. Medicaid will require trial of ezetimibe prior to coverage of PCSK9i. - Reviewed long term complications of uncontrolled cholesterol - Recommend to continue atorvastatin  40 mg PO daily  - Repeat lipid panel at next PCP appt   Patient verbalized  understanding of plan.    Follow Up Plan: PCP 10/13/24, Pharmacist telephone 09/16/24   Lorain Baseman, PharmD [facility] [phone]   "

## 2024-08-08 ENCOUNTER — Other Ambulatory Visit: Payer: Self-pay

## 2024-08-08 NOTE — Patient Instructions (Signed)
 Visit Information  Rodney Kane was given information about Medicaid Managed Care team care coordination services as a part of their [employer] Medicaid benefit.   If you would like to schedule transportation through your [facility] plan, please call the following number at least 2 days in advance of your appointment: [phone]  If you are experiencing a behavioral health crisis, call the [employer] [place]  Behavioral Health Crisis Line at [phone]). The line is available 24 hours a day, seven days a week.   Patient verbalizes understanding of instructions and care plan provided today and agrees to view in MyChart. Active MyChart status and patient understanding of how to access instructions and care plan via MyChart confirmed with patient.     The Patient will call orthopedic provider and gastroenterology as advised to schedule appointments. and Telephone follow up appointment with Managed Medicaid care management team member scheduled for: 09/05/24 at 2 PM  Rodney Finlay, RN MSN [name]: [phone]  Fax: [phone]   Following is a copy of your plan of care:  Goals Addressed             This Visit's Progress    VBCI RN Care Plan- DM   On track    Problems:  Chronic Disease Management support and education needs related to DMII  Goal: Over the next 30 days the Patient will demonstrate Ongoing adherence to prescribed treatment plan for DM II as evidenced by patient report of fasting blood sugar 80-130 verbalize basic understanding of DM II disease process and self health management plan as evidenced by verbal explanation lifestyle changes and consistent medication compliance Report no episodes of hypoglycemia   Interventions:   Diabetes Interventions: Assessed patient's understanding of A1c goal: <7% Reviewed medications with patient and  discussed importance of medication adherence Counseled on importance of regular laboratory monitoring as prescribed Discussed plans with patient for ongoing care management follow up and provided patient with direct contact information for care management team Provided patient with written educational materials related to hypo and hyperglycemia and importance of correct treatment Review of patient status, including review of consultants reports, relevant laboratory and other test results, and medications completed Reviewed most recent A1c, goal A1c, and how to achieve goal. Congratulated patient on recent decrease in A1c Advised patient to monitor for increased episodes of hypoglycemia, and to report to PCP Lab Results  Component Value Date   HGBA1C 8.0 (H) 07/15/2024    General Interventions Reviewed testosterone  level per recent lab work. Patient reports continued issues related to fatigue and erectile dysfunction. Message sent to PCP Offered to start 3-way call to orthopedic office. Patient declines and states he will call himself. Provided patient with office phone number via MyChart message Offered to start 3-way call to GI office to schedule colon cancer screening. Patient declines and states he will call himself. Provided patient with office phone number via MyChart message Call placed to [facility] to check on the status of patient's SSDI/SSI application. Message left with Katheryn, disability assistant, requesting a call back  Patient Self-Care Activities:  Attend all scheduled provider appointments Call pharmacy for medication refills 3-7 days in advance of running out of medications Call provider office for new concerns or questions  Perform all self care activities independently  Take medications as prescribed   keep appointment with eye doctor check blood sugar at prescribed times: three times daily drink 6 to 8  glasses of water each day limit fast food meals to no more  than 1 per week  Plan:  Telephone follow up appointment with care management team member scheduled for:  09/05/24 at 2 PM

## 2024-08-08 NOTE — Patient Outreach (Signed)
 Complex Care Management   Visit Note  08/08/2024  Name:  Rodney Kane MRN: [id number] DOB: 01-06-1976  Situation: Referral received for Complex Care Management related to Diabetes with Complications I obtained verbal consent from Patient.  Visit completed with Patient  on the phone  Background:   Past Medical History:  Diagnosis Date   Coronavirus infection 06/2020   COVID 06/2020   Diabetes mellitus without complication (HCC)    Hemoglobin A1C greater than 9%, indicating poor diabetic control 12/2019   Hyperglycemia 12/2019   Hyperlipidemia 12/2019   Hypertension    Hypertensive urgency 12/2019   Noncompliance with medication regimen 12/2019   Nonischemic (hypertensive) dilated cardiomyopathy (HCC) 12/2016   EF has been 30 to 35%, recently lower.   Stroke [facility])    Urine ketones 12/2019   Vitamin D  deficiency 12/2019    Assessment: Patient Reported Symptoms:  Cognitive Cognitive Status: Able to follow simple commands, Alert and oriented to person, place, and time, Other: (Diffulty speaking/stuttering) Cognitive/Intellectual Conditions Management [RPT]: Brain Injury Brain Injury: CVA (2021, 2022)   Health Maintenance Behaviors: Annual physical exam, Exercise Health Facilitated by: Pain control, Rest  Neurological Neurological Review of Symptoms: No symptoms reported    HEENT HEENT Symptoms Reported: No symptoms reported      Cardiovascular Cardiovascular Symptoms Reported: No symptoms reported Does patient have uncontrolled Hypertension?: No Cardiovascular Management Strategies: Medication therapy, Routine screening Cardiovascular Comment: Patient reports he has not been checking BP or weight at home  Respiratory Respiratory Symptoms Reported: No symptoms reported    Endocrine Endocrine Symptoms Reported: No symptoms reported Is patient diabetic?: Yes Is patient checking blood sugars at home?: Yes List most recent blood sugar readings, include date and time  of day: FreeStyle Libre, 69 this morning. Patient denies symptoms related to hypoglyemia at that time Endocrine Comment: Note per chart review A1c has decreased to 8.0%. Patient reports episodes of hypoglycemia periodically but not on a regular basis  Gastrointestinal Gastrointestinal Symptoms Reported: No symptoms reported Gastrointestinal Management Strategies: Diet modification, Medication therapy Gastrointestinal Comment: Note per chart review, a referral was placed for colon cancer screening but they have been unable to get in touch with patient.    Genitourinary Genitourinary Symptoms Reported: Other Other Genitourinary Symptoms: Patient reports continued issues related to erectile dysfunction. He reports he does not take Cialis because it does not work for him. Note per chart review, testosterone  was checked by PCP at recent appointment Genitourinary Management Strategies: Coping strategies  Integumentary Integumentary Symptoms Reported: No symptoms reported    Musculoskeletal Musculoskelatal Symptoms Reviewed: Joint pain, Limited mobility Additional Musculoskeletal Details: Patient reports he still has not heard from orthopedic surgeon regarding left shoulder pain. Patient does feel that pain is limiting his ability to attend to ADLs at times. Reminded patient to contact their office to schedule an appointment Musculoskeletal Management Strategies: Coping strategies, Medication therapy, Routine screening Musculoskeletal Comment: Patient denies falls since previous CMRN visit Falls in the past year?: No Number of falls in past year: 1 or less Was there an injury with Fall?: No Fall Risk Category Calculator: 0 Patient Fall Risk Level: Low Fall Risk Patient at Risk for Falls Due to: No Fall Risks Fall risk Follow up: Falls evaluation completed, Education provided, Falls prevention discussed  Psychosocial Psychosocial Symptoms Reported: Not assessed          08/08/2024    PHQ2-9  Depression Screening   Little interest or pleasure in doing things    Feeling down, depressed, or  hopeless    PHQ-2 - Total Score    Trouble falling or staying asleep, or sleeping too much    Feeling tired or having little energy    Poor appetite or overeating     Feeling bad about yourself - or that you are a failure or have let yourself or your family down    Trouble concentrating on things, such as reading the newspaper or watching television    Moving or speaking so slowly that other people could have noticed.  Or the opposite - being so fidgety or restless that you have been moving around a lot more than usual    Thoughts that you would be better off dead, or hurting yourself in some way    PHQ2-9 Total Score    If you checked off any problems, how difficult have these problems made it for you to do your work, take care of things at home, or get along with other people    Depression Interventions/Treatment      There were no vitals filed for this visit. Pain Scale: 0-10 Pain Score: 5  Pain Type: Acute pain Pain Location: Shoulder Pain Orientation: Left  Medications Reviewed Today     Reviewed by Arno Rosaline SQUIBB, RN (Registered Nurse) on 08/08/24 at 1242  Med List Status: <None>   Medication Order Taking? Sig Documenting Provider Last Dose Status Informant  amLODipine  (NORVASC ) 10 MG tablet [id number]  Take 1 tablet (10 mg total) by mouth daily. Paseda, Folashade R, FNP  Active   apixaban  (ELIQUIS ) 5 MG TABS tablet [id number]  Take 1 tablet (5 mg total) by mouth 2 (two) times daily. [name], NP  Active   atorvastatin  (LIPITOR ) 40 MG tablet [id number]  Take 1 tablet (40 mg total) by mouth daily. [name], NP  Active   Blood Glucose Monitoring Suppl DEVI [id number]  1 each by Does not apply route in the morning, at noon, and at bedtime. May substitute to any manufacturer covered by patient's insurance. [name], NP  Active            Med Note DELSA LORAIN SQUIBB Pablo Dec 21, 2023  2:46 PM)    chlorthalidone  (HYGROTON ) 25 MG tablet [id number]  Take 1 tablet (25 mg total) by mouth daily. Anner Alm ORN, MD  Active   Continuous Glucose Sensor (FREESTYLE LIBRE 3 PLUS SENSOR) [place] [id number]  Change sensor every 15 days. [name], NP  Active   Dapagliflozin  Pro-metFORMIN  ER (XIGDUO  XR) 04-999 MG TB24 [id number]  Take 1 tablet by mouth daily. [name], NP  Active   diclofenac  (VOLTAREN ) 75 MG EC tablet [id number]  Take 1 tablet (75 mg total) by mouth 2 (two) times daily. [name], NP  Active   Insulin  Pen Needle (PEN NEEDLES) 32G X 4 MM MISC [id number]  Use as directed to inject insulin  once daily Nichols, Tonya S, NP  Active   LANTUS  SOLOSTAR 100 UNIT/ML Solostar Pen [id number]  Inject 20 Units into the skin daily. May increase up to 30 units daily as instructed by your provider. [name], NP  Active   losartan  (COZAAR ) 50 MG tablet [id number]  Take 1 tablet (50 mg total) by mouth daily. Paseda, Folashade R, FNP  Active   metoprolol  succinate (TOPROL -XL) 100 MG 24 hr tablet [id number]  Take 1 tablet (100 mg total) by mouth daily. Take with or immediately following a meal. Anner Alm ORN, MD  Active  Semaglutide , 2 MG/DOSE, (OZEMPIC , 2 MG/DOSE,) 8 MG/3ML SOPN [id number]  Inject 2 mg into the skin once a week. [name], NP  Active   tadalafil (CIALIS) 20 MG tablet [id number]  Take 20 mg by mouth daily as needed. [provider]  Active             Recommendation:   Specialty provider follow-up orthopedic and GI (patient to call and schedule) Continue Current Plan of Care  Follow Up Plan:   Telephone follow up appointment date/time:  09/05/24 at 2 PM  [name], RN MSN [place]  [facility] RN Care Manager Direct Dial: [phone]  Fax: [phone]

## 2024-08-10 NOTE — Progress Notes (Unsigned)
 " Cardiology Office Note   Date: 08/11/2024  ID:  Kregg Cihlar 03-20-1976 [id number] PCP: [name], NP  [facility] Providers Cardiologist: Alm Clay, MD     Chief Complaint: Suleyman Ehrman is a 49 y.o.male with PMH of HFrEF, NICM, LV thrombus, hypertension, hyperlipidemia, T2DM, CVA, venous stasis who presents to the clinic for two-month follow-up.    Mr. Leoni was seen in clinic in 2018 after being hospitalized with hypertensive urgency and new onset heart failure, echo showed LVEF 30-35% and diffuse hypokinesis. Cardiac catheterization showed normal coronary arteries. It was felt that his cardiomyopathy was due to uncontrolled hypertension. He unfortunately lost to follow up in clinic.  Had an acute CVA in 06/2020. Was found to have LVEF of 20-25% and a new LV thrombus. He was started on warfarin. Was hypertensive in PCP's office after discharge, but he declined ER evaluation.  Seen back in clinic 08/2020, Lasix  started and HF GDMT up-titrated. Unfortunately admitted again with recurrent CVA 09/2020 after being nonadherent to his anticoagulation. Echo showed LVEF less than 20% and G3DD. He was switched from warfarin to Xarelto  at discharge.   Repeat echo 05/2021 showed LVEF 30-35% with improvement in thrombus. Echo during admission 02/2022 showed improvement in LVEF to 45-50%.   Seen back in clinic 11/2023, hypertensive. Losartan  and Toprol  increased. Visit 05/2024 he was complaining of his heart racing. Monitor 06/2024 showed predominantly NSR with average HR 89 bpm, 1 episode of SVT with rate of 203 bpm that lasted for 16 seconds, 2 episodes of VT with longest lasting 6 beats, a couple isolated PACs and PVCs. There were no triggered events. He was also still hypertensive and chlorthalidone  was started.    History of Present Illness: Today he is doing well and is shocked at his BP result. He is not checking his BP at home. His palpitations have subsided. We  did review the results of his monitor. He has dyspnea when he walks up a lot of stairs but this is chronic since he was diagnosed with heart failure. He had questions regarding if he ever had a heart attack or blockages in his heart. He does experience some mild LE edema that self-resolves when he travels from [place] to [place] to see his family. He is taking his medications as prescribed.  ROS: Denies chest pain, orthopnea, PND, lightheadedness, dizziness, syncope, abnormal bleeding.   Studies Reviewed: The following studies were reviewed today: Cardiac Studies & Procedures   ______________________________________________________________________________________________ CARDIAC CATHETERIZATION  CARDIAC CATHETERIZATION 01/05/2017  Conclusion Images from the original result were not included.   Angiographically normal coronary arteries  LV end diastolic pressure is normal - indicating adequate diuresis  Hemodynamic findings not consistent with pulmonary hypertension - with normal PCL bpm PA pressures.  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 Given his presentation with uncontrolled hypertension, most consistent with hypertensive cardiomyopathy. He has been adequately diuresed however. Blood pressures are still borderline, however he is lying flat.   Plan:  Brachial sheath removed in PACU holding area  Return to nursing unit for ongoing care  Defer follow-up care to primary team    Alm Clay, MD Alm Clay, M.D., M.S. Interventional Cardiologist  Pager # [phone] Phone # [phone] [address] [place], [place] [address]  Findings Coronary Findings Diagnostic  Dominance: Co-dominant  Left Main Vessel was injected. Vessel is normal in caliber and large. Vessel is angiographically normal.  Left Anterior Descending Vessel was injected. Vessel is normal in caliber. Tapers to a small  caliber distally the apex Vessel is angiographically  normal.  First Septal Branch Vessel is small in size.  Second Diagonal Branch Vessel is moderate in size.  Second Septal Branch Vessel is small in size.  Third Diagonal Branch Vessel is small in size.  Third Septal Branch Vessel is small in size.  Ramus Intermedius Vessel was injected. Vessel is normal in caliber. Vessel is angiographically normal.  Lateral Ramus Intermedius Vessel is small in size.  Left Circumflex Vessel was injected. Vessel is normal in caliber and large. Vessel is angiographically normal.  Second Obtuse Marginal Branch Vessel is moderate in size.  Lateral Second Obtuse Marginal Branch Vessel is small in size.  Right Coronary Artery Vessel was injected. Vessel is normal in caliber and large. Vessel is angiographically normal.  Right Posterior Descending Artery Vessel is moderate in size.  Inferior Septal Vessel is small in size.  Right Posterior Atrioventricular Artery Vessel is small in size.  Intervention  No interventions have been documented.     ECHOCARDIOGRAM  ECHOCARDIOGRAM COMPLETE 02/19/2022  Narrative ECHOCARDIOGRAM REPORT    Patient Name:   YARIEL FERRARIS Date of Exam: 02/19/2022 Medical Rec #:  [id number]             Height:       73.0 in Accession #:    [id number]            Weight:       333.1 lb Date of Birth:  Sep 23, 1975             BSA:          2.673 m Patient Age:    45 years              BP:           135/74 mmHg Patient Gender: M                     HR:           86 bpm. Exam Location:  Inpatient  Procedure: 2D Echo, Cardiac Doppler, Color Doppler and Strain Analysis  Indications:    CHF  History:        Patient has prior history of Echocardiogram examinations, most recent 06/10/2021. CHF and Cardiomyopathy, Stroke, Arrythmias:Atrial Fibrillation; Risk Factors:Hypertension, Diabetes and Dyslipidemia. CKD Stage 2, LV thrombus (07/20/20).  Sonographer:    Clotilda Center Referring Phys: [id number] ALLISON  L ELLIS   Sonographer Comments: Image acquisition challenging due to respiratory motion. Global longitudinal strain was attempted. IMPRESSIONS   1. Left ventricular ejection fraction, by estimation, is 45 to 50% (improved compared to prior). The left ventricle has mildly decreased function. The left ventricle demonstrates global hypokinesis. There is mild left ventricular hypertrophy. Left ventricular diastolic parameters are consistent with Grade I diastolic dysfunction (impaired relaxation). 2. Right ventricular systolic function is normal. The right ventricular size is normal. Tricuspid regurgitation signal is inadequate for assessing PA pressure. 3. The mitral valve is normal in structure. Trivial mitral valve regurgitation. No evidence of mitral stenosis. 4. The aortic valve is tricuspid. Aortic valve regurgitation is not visualized. No aortic stenosis is present. 5. The inferior vena cava is normal in size with greater than 50% respiratory variability, suggesting right atrial pressure of 3 mmHg.  FINDINGS Left Ventricle: Left ventricular ejection fraction, by estimation, is 45 to 50%. The left ventricle has mildly decreased function. The left ventricle demonstrates global hypokinesis. The left ventricular internal cavity size was normal in size. There  is mild left ventricular hypertrophy. Left ventricular diastolic parameters are consistent with Grade I diastolic dysfunction (impaired relaxation).  Right Ventricle: The right ventricular size is normal. No increase in right ventricular wall thickness. Right ventricular systolic function is normal. Tricuspid regurgitation signal is inadequate for assessing PA pressure.  Left Atrium: Left atrial size was normal in size.  Right Atrium: Right atrial size was normal in size.  Pericardium: Trivial pericardial effusion is present.  Mitral Valve: The mitral valve is normal in structure. Trivial mitral valve regurgitation. No evidence of mitral  valve stenosis.  Tricuspid Valve: The tricuspid valve is normal in structure. Tricuspid valve regurgitation is not demonstrated.  Aortic Valve: The aortic valve is tricuspid. Aortic valve regurgitation is not visualized. No aortic stenosis is present.  Pulmonic Valve: The pulmonic valve was normal in structure. Pulmonic valve regurgitation is not visualized.  Aorta: The aortic root is normal in size and structure.  Venous: The inferior vena cava is normal in size with greater than 50% respiratory variability, suggesting right atrial pressure of 3 mmHg.  IAS/Shunts: No atrial level shunt detected by color flow Doppler.   LEFT VENTRICLE PLAX 2D LVIDd:         4.80 cm      Diastology LVIDs:         3.40 cm      LV e' medial:    6.31 cm/s LV PW:         1.30 cm      LV E/e' medial:  9.5 LV IVS:        1.20 cm      LV e' lateral:   5.77 cm/s LVOT diam:     2.50 cm      LV E/e' lateral: 10.4 LV SV:         64 LV SV Index:   24 LVOT Area:     4.91 cm  LV Volumes (MOD) LV vol d, MOD A2C: 127.0 ml LV vol d, MOD A4C: 149.0 ml LV vol s, MOD A2C: 68.4 ml LV vol s, MOD A4C: 77.0 ml LV SV MOD A2C:     58.6 ml LV SV MOD A4C:     149.0 ml LV SV MOD BP:      68.7 ml  RIGHT VENTRICLE RV S prime:     13.60 cm/s TAPSE (M-mode): 2.1 cm  LEFT ATRIUM             Index        RIGHT ATRIUM           Index LA diam:        4.20 cm 1.57 cm/m   RA Area:     14.60 cm LA Vol (A2C):   50.1 ml 18.75 ml/m  RA Volume:   34.00 ml  12.72 ml/m LA Vol (A4C):   27.5 ml 10.29 ml/m LA Biplane Vol: 39.8 ml 14.89 ml/m AORTIC VALVE LVOT Vmax:   80.00 cm/s LVOT Vmean:  59.300 cm/s LVOT VTI:    0.130 m  AORTA Ao Root diam: 3.60 cm Ao Asc diam:  3.20 cm  MITRAL VALVE MV Area (PHT): 3.77 cm    SHUNTS MV Decel Time: 201 msec    Systemic VTI:  0.13 m MV E velocity: 59.90 cm/s  Systemic Diam: 2.50 cm MV A velocity: 75.50 cm/s MV E/A ratio:  0.79  Dalton McleanMD Electronically signed by Ezra Kanner Signature Date/Time: 02/19/2022/4:32:04 PM    Final    MONITORS  LONG TERM MONITOR (3-14 DAYS) 07/13/2024  Narrative Zio patch monitor 14 days 06/21/2024 - 07/05/2024: Dominant rhythm: Sinus. HR 59-148 bpm. Avg HR 89 bpm, in sinus rhythm. 1 episodes of SVT w/aberrancy, at 203 bpm for 16.8 secs. <1% isolated SVE,  couplet/triplets. 2 episodes of VT, fastest at 154 bpm for 6 beats, longest for 5 beats at 123 bpm. <1% isolated VE,  couplet/triplets. No atrial fibrillation/atrial flutter/VT/high grade AV block, sinus pause >3sec noted. 0 patient triggered events.       ______________________________________________________________________________________________                        Physical Exam: VS: BP 102/70   Pulse 100   Ht 6' 1 (1.854 m)   Wt (!) 330 lb (149.7 kg)   SpO2 98%   BMI 43.54 kg/m  Wt Readings from Last 3 Encounters:  08/11/24 (!) 330 lb (149.7 kg)  07/15/24 (!) 331 lb (150.1 kg)  06/08/24 (!) 340 lb (154.2 kg)    GEN: Well nourished, in NAD HEENT: Normal NECK: No JVD CARDIAC: RRR, no murmurs, rubs, gallops RESPIRATORY: Clear to auscultation bilaterally ABDOMEN: Soft, non-tender, non-distended MUSCULOSKELETAL: No edema, no deformity  SKIN: Warm and dry NEUROLOGIC:  Alert and oriented x 3 PSYCHIATRIC:  Pleasant, normal affect   Assessment & Plan: 1. Chronic HFrEF/NICM: Noted LVEF 30-35% on echo in 2018 in the setting of hypertensive urgency. LHC clean. Unfortunately had medication compliance issues and EF decreased further to 20-25% on echo in 06/2020 during an admission for an acute CVA. A new LV thrombus was also found. However, most recent echo was in 02/2022 during an admission and his LVEF was improved to 45-50%. Fortunately, he continues to do well and his only HF symptom is chronic dyspnea with walking up a flight of stairs. He appears euvolemic on exam today.  - Continue dapagliflozin  10 mg daily - Continue losartan  50 mg  daily - Continue Toprol -XL 100 mg daily - GDMT with Entresto limited by cost and with spironolactone  due to history of hyperkalemia  2. Hypertension: BP today 108/69. He is not checking this at home.  - Continue amlodipine  10 mg daily - Continue chlorthalidone  25 mg daily - Continue losartan  50 mg daily - Continue Toprol -XL 100 mg daily - Recommend checking BP at home two hours after medications and bringing a log to his next visit  3. Hyperlipidemia, goal LDL < 55: 12/31/2023 LDL 82, HDL 51, TGs 64, total 146. 07/15/2024 AST 17, ALT 19. - Check fasting lipid panel and LFTs today - Continue atorvastatin  40 mg daily - plan to increase to 80 mg daily if LDL still above goal ADDENDUM 08/12/2024: LDL 87. Increase atorvastatin  to 80 mg daily, check hepatic function panel 1-2 weeks and fasting lipids in 6-8 weeks.  4. LV thrombus: Noted on echo in the setting of an admission for acute CVA in 06/2020 with LVEF decreased to 20-25%. Thrombus was improved on follow-up echo 05/2021 and not shown on echo 02/2022. - Continue Eliquis  5 mg twice daily     Dispo: Follow-up with Dr. Anner in 3 months.  Signed, Saddie GORMAN Cleaves, NP 08/11/2024 12:56 PM [facility] "

## 2024-08-11 ENCOUNTER — Ambulatory Visit: Attending: General Practice

## 2024-08-11 ENCOUNTER — Encounter: Payer: Self-pay | Admitting: General Practice

## 2024-08-11 VITALS — BP 102/70 | HR 100 | Ht 73.0 in | Wt 330.0 lb

## 2024-08-11 DIAGNOSIS — I428 Other cardiomyopathies: Secondary | ICD-10-CM | POA: Diagnosis not present

## 2024-08-11 DIAGNOSIS — I5020 Unspecified systolic (congestive) heart failure: Secondary | ICD-10-CM | POA: Insufficient documentation

## 2024-08-11 DIAGNOSIS — I10 Essential (primary) hypertension: Secondary | ICD-10-CM | POA: Diagnosis not present

## 2024-08-11 DIAGNOSIS — E785 Hyperlipidemia, unspecified: Secondary | ICD-10-CM | POA: Diagnosis not present

## 2024-08-11 LAB — HEPATIC FUNCTION PANEL
ALT: 25 IU/L (ref 0–44)
AST: 22 IU/L (ref 0–40)
Albumin: 4.3 g/dL (ref 4.1–5.1)
Alkaline Phosphatase: 88 IU/L (ref 47–123)
Bilirubin Total: 0.5 mg/dL (ref 0.0–1.2)
Bilirubin, Direct: 0.14 mg/dL (ref 0.00–0.40)
Total Protein: 7.8 g/dL (ref 6.0–8.5)

## 2024-08-11 LAB — LIPID PANEL
Chol/HDL Ratio: 3.3 ratio (ref 0.0–5.0)
Cholesterol, Total: 150 mg/dL (ref 100–199)
HDL: 46 mg/dL
LDL Chol Calc (NIH): 87 mg/dL (ref 0–99)
Triglycerides: 92 mg/dL (ref 0–149)
VLDL Cholesterol Cal: 17 mg/dL (ref 5–40)

## 2024-08-11 NOTE — Patient Instructions (Addendum)
 Medication Instructions:  Your physician recommends that you continue on your current medications as directed. Please refer to the Current Medication list given to you today. *If you need a refill on your cardiac medications before your next appointment, please call your pharmacy*  Lab Work: TODAY-LIPIDS AND LFTS If you have labs (blood work) drawn today and your tests are completely normal, you will receive your results only by: MyChart Message (if you have MyChart) OR A paper copy in the mail If you have any lab test that is abnormal or we need to change your treatment, we will call you to review the results.  Testing/Procedures: None ordered  Follow-Up: At [facility], you and your health needs are our priority.  As part of our continuing mission to provide you with exceptional heart care, our providers are all part of one team.  This team includes your primary Cardiologist (physician) and Advanced Practice Providers or APPs (Physician Assistants and Nurse Practitioners) who all work together to provide you with the care you need, when you need it.  Your next appointment:   3 month(s)  Provider:   Alm Clay, MD    We recommend signing up for the patient portal called MyChart.  Sign up information is provided on this After Visit Summary.  MyChart is used to connect with patients for Virtual Visits (Telemedicine).  Patients are able to view lab/test results, encounter notes, upcoming appointments, etc.  Non-urgent messages can be sent to your provider as well.   To learn more about what you can do with MyChart, go to [email or web address].   Other Instructions

## 2024-08-12 ENCOUNTER — Ambulatory Visit: Payer: Self-pay

## 2024-08-12 DIAGNOSIS — E7800 Pure hypercholesterolemia, unspecified: Secondary | ICD-10-CM

## 2024-08-12 DIAGNOSIS — I6300 Cerebral infarction due to thrombosis of unspecified precerebral artery: Secondary | ICD-10-CM

## 2024-08-12 MED ORDER — ATORVASTATIN CALCIUM 80 MG PO TABS: 80.0000 mg | ORAL_TABLET | Freq: Every day | ORAL | 1 refills | Status: AC

## 2024-08-12 NOTE — Addendum Note (Signed)
 Addended by: SEBASTIAN, TEE Y on: 08/12/2024 05:12 PM   Modules accepted: Orders

## 2024-08-17 ENCOUNTER — Telehealth: Payer: Self-pay | Admitting: Pharmacy Technician

## 2024-08-17 NOTE — Progress Notes (Signed)
" ° °  08/17/2024  Patient ID: Rodney Kane, male   DOB: [birth year], 49 y.o.   MRN: [id number]  Patient engaged with clinical pharmacist for management of diabetes and heart failure on 08/05/2024. Outreach by [employer] technician was requested.   Outreached patient to discuss diabetes medication management and medication access  Left voicemail for patient to return my call at their convenience.   Patient returned the call  at 11:30am and left a voicemail message for me as I was on another call.  Called patient back but received his voicemail and another message was left.  Suraya Vidrine, CPhT [facility] Office: [phone] Email: [email or web address]    "

## 2024-08-19 ENCOUNTER — Telehealth: Payer: Self-pay | Admitting: Pharmacy Technician

## 2024-08-19 NOTE — Progress Notes (Signed)
" ° °  08/19/2024  Patient ID: Rodney Kane, male   DOB: June 29, 1976, 49 y.o.   MRN: [id number]  Patient engaged with clinical pharmacist for management of diabetes and heart failure on 08/05/2024. Outreach by [employer] technician was requested.   Outreached patient to discuss diabetes, medication access, and heart failure medication management. Left voicemail for patient to return my call at their convenience.    Euan Wandler, CPhT [facility] Office: [phone] Email: [email or web address]  "

## 2024-08-22 ENCOUNTER — Telehealth: Payer: Self-pay | Admitting: Pharmacy Technician

## 2024-08-22 DIAGNOSIS — E118 Type 2 diabetes mellitus with unspecified complications: Secondary | ICD-10-CM

## 2024-08-22 DIAGNOSIS — Z59819 Housing instability, housed unspecified: Secondary | ICD-10-CM

## 2024-08-23 ENCOUNTER — Telehealth: Payer: Self-pay | Admitting: *Deleted

## 2024-08-23 MED ORDER — LANTUS SOLOSTAR 100 UNIT/ML ~~LOC~~ SOPN: 12.0000 [IU] | PEN_INJECTOR | Freq: Every day | SUBCUTANEOUS | 3 refills | Status: AC

## 2024-08-23 NOTE — Progress Notes (Unsigned)
 Complex Care Management Care Guide Note  08/23/2024 Name: Rodney Kane MRN: [id number] DOB: 20-Dec-1975  Rodney Kane is a 49 y.o. year old male who is a primary care patient of [name], NP and is actively engaged with the care management team. I reached out to [employer] by phone today to assist with scheduling  with the BSW.  Follow up plan: Unsuccessful telephone outreach attempt made.    Harlene Satterfield  [facility]  [facility], [facility] Guide  Direct Dial: [phone]  Fax [phone]

## 2024-08-23 NOTE — Addendum Note (Signed)
 Addended by: BRINDA LORAIN SQUIBB on: 08/23/2024 10:52 AM   Modules accepted: Orders

## 2024-08-24 NOTE — Progress Notes (Signed)
 Complex Care Management Care Guide Note  08/24/2024 Name: Rodney Kane MRN: [id number] DOB: 1976/02/17  Rodney Kane is a 49 y.o. year old male who is a primary care patient of [name], NP and is actively engaged with the care management team. I reached out to [employer] by phone today to assist with scheduling  with the BSW.  Follow up plan: Telephone appointment with complex care management team member scheduled for:  08/30/24  Harlene Satterfield  [facility]  [facility], [facility] Guide  Direct Dial: [phone]  Fax [phone]

## 2024-08-29 ENCOUNTER — Ambulatory Visit: Admitting: Dermatology

## 2024-08-30 ENCOUNTER — Telehealth

## 2024-09-05 ENCOUNTER — Telehealth

## 2024-09-16 ENCOUNTER — Other Ambulatory Visit

## 2024-10-13 ENCOUNTER — Ambulatory Visit: Payer: Self-pay | Admitting: Nurse Practitioner

## 2024-11-08 ENCOUNTER — Ambulatory Visit: Admitting: Cardiology
